# Patient Record
Sex: Female | Born: 1970
Health system: Southern US, Community
[De-identification: ages and names within clinical notes are randomized; demographics above are authoritative.]

## PROBLEM LIST (undated history)

## (undated) DIAGNOSIS — E7800 Pure hypercholesterolemia, unspecified: Secondary | ICD-10-CM

## (undated) DIAGNOSIS — L709 Acne, unspecified: Secondary | ICD-10-CM

## (undated) DIAGNOSIS — B009 Herpesviral infection, unspecified: Secondary | ICD-10-CM

## (undated) DIAGNOSIS — Z973 Presence of spectacles and contact lenses: Secondary | ICD-10-CM

## (undated) DIAGNOSIS — Z8669 Personal history of other diseases of the nervous system and sense organs: Secondary | ICD-10-CM

## (undated) DIAGNOSIS — Z8489 Family history of other specified conditions: Secondary | ICD-10-CM

## (undated) DIAGNOSIS — R519 Headache, unspecified: Secondary | ICD-10-CM

## (undated) DIAGNOSIS — R51 Headache: Secondary | ICD-10-CM

## (undated) DIAGNOSIS — K5909 Other constipation: Secondary | ICD-10-CM

## (undated) HISTORY — PX: COLONOSCOPY: SHX174

## (undated) HISTORY — PX: ABDOMINAL HYSTERECTOMY: SHX81

## (undated) HISTORY — PX: TONSILLECTOMY: SUR1361

## (undated) HISTORY — DX: Other constipation: K59.09

---

## 2005-08-07 ENCOUNTER — Ambulatory Visit: Payer: Self-pay | Admitting: Internal Medicine

## 2005-08-14 ENCOUNTER — Ambulatory Visit: Payer: Self-pay | Admitting: Internal Medicine

## 2010-11-30 ENCOUNTER — Ambulatory Visit: Payer: Self-pay | Admitting: Unknown Physician Specialty

## 2012-01-01 ENCOUNTER — Ambulatory Visit: Payer: Self-pay | Admitting: Obstetrics and Gynecology

## 2012-04-03 ENCOUNTER — Ambulatory Visit (INDEPENDENT_AMBULATORY_CARE_PROVIDER_SITE_OTHER): Payer: BC Managed Care – PPO | Admitting: Family Medicine

## 2012-04-03 ENCOUNTER — Ambulatory Visit: Payer: BC Managed Care – PPO

## 2012-04-03 VITALS — BP 120/78 | HR 80 | Temp 98.1°F | Resp 16 | Ht 66.0 in | Wt 145.0 lb

## 2012-04-03 DIAGNOSIS — S93401A Sprain of unspecified ligament of right ankle, initial encounter: Secondary | ICD-10-CM

## 2012-04-03 DIAGNOSIS — S93409A Sprain of unspecified ligament of unspecified ankle, initial encounter: Secondary | ICD-10-CM

## 2012-04-03 DIAGNOSIS — M25579 Pain in unspecified ankle and joints of unspecified foot: Secondary | ICD-10-CM

## 2012-04-03 IMAGING — CR DG ANKLE COMPLETE 3+V*R*
2 series · 2 of 2 positions shown · non-contrast
Comparison: None

CLINICAL DATA: Ankle pain

RIGHT ANKLE - COMPLETE 3+ VIEW

[AP]
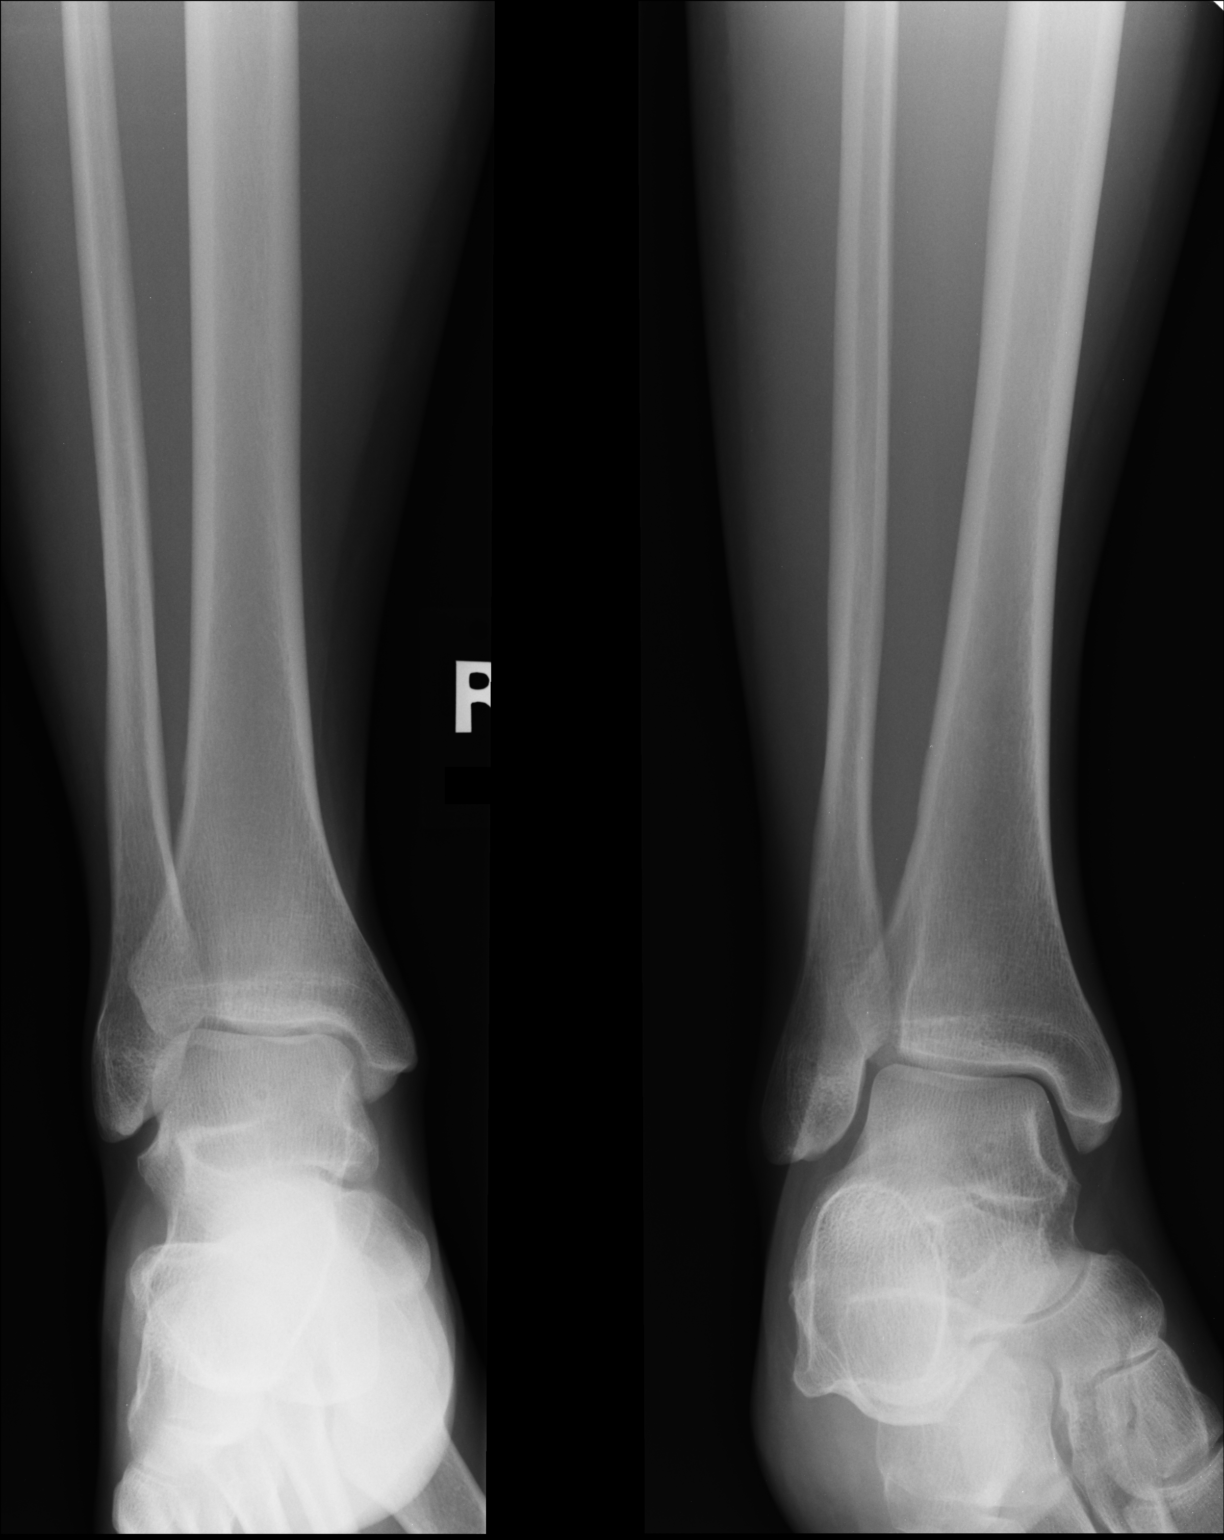

[lateral]
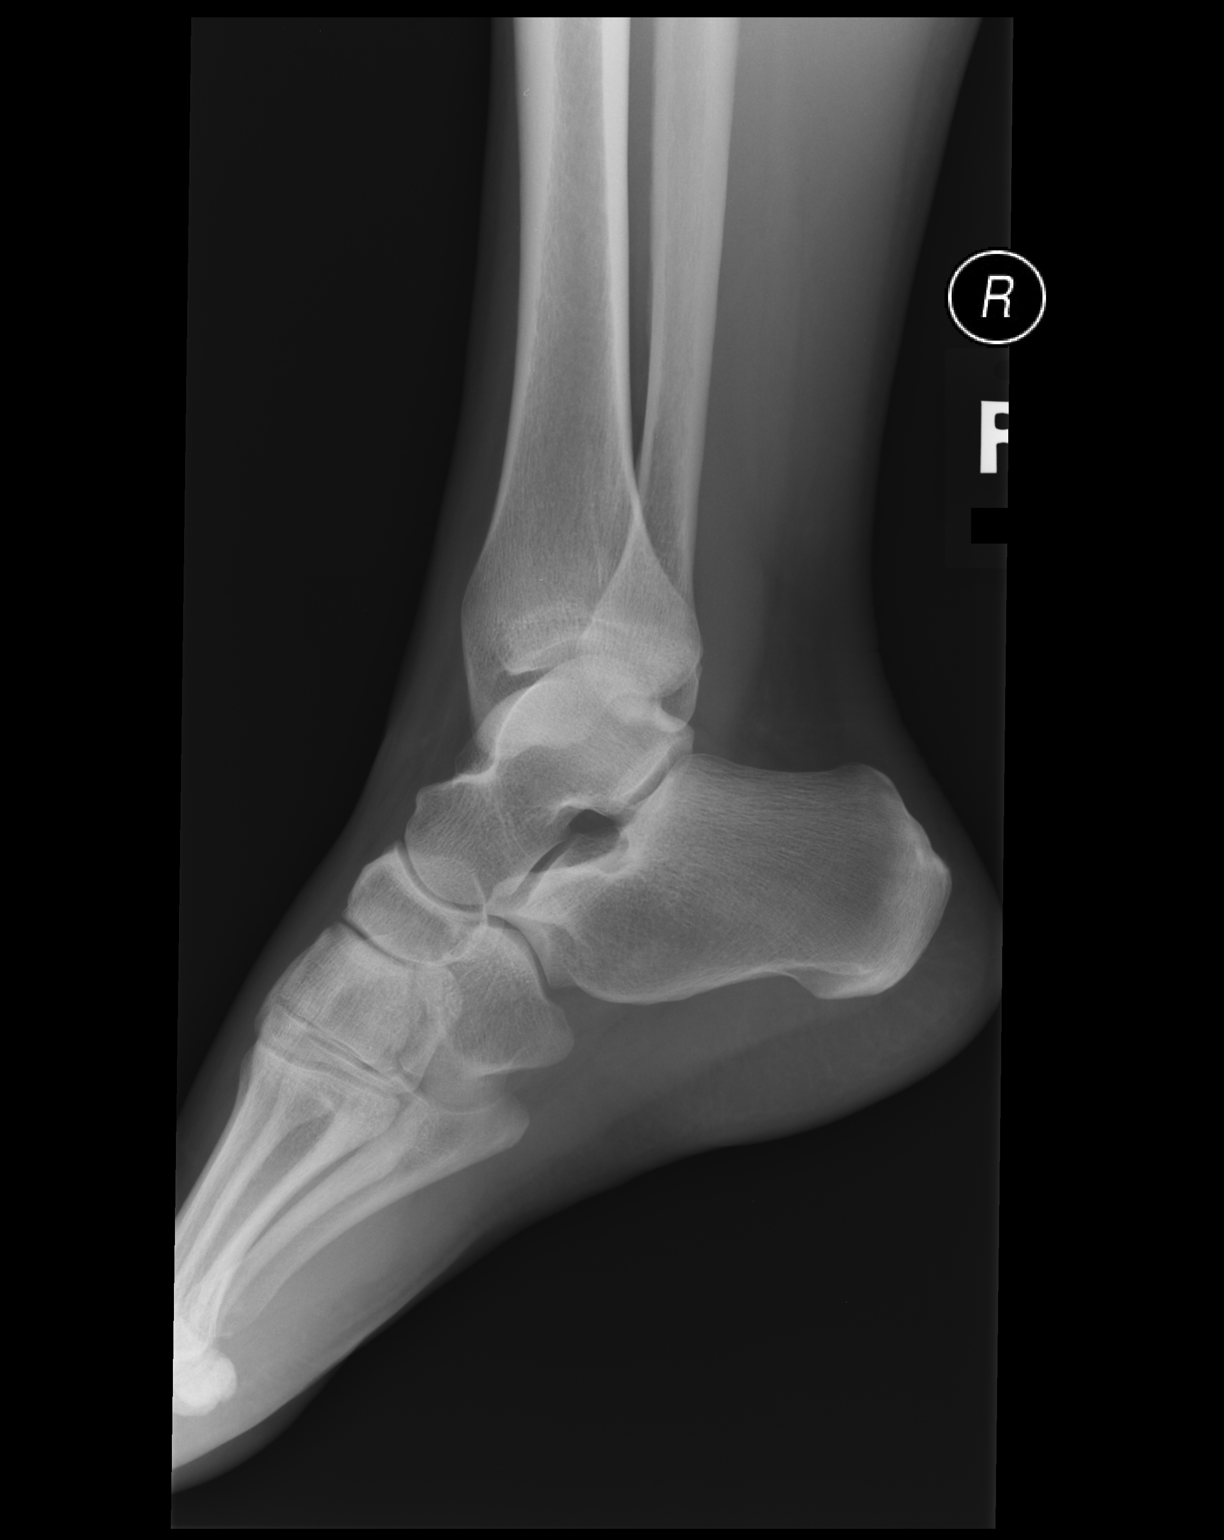

[2 of 2 positions shown; findings below may reference images not displayed]

FINDINGS: There is no evidence of fracture or dislocation.  There
is no evidence of arthropathy or other focal bone abnormality.
Soft tissues are unremarkable.
IMPRESSION: Negative exam.

## 2012-04-03 MED ORDER — DICLOFENAC SODIUM 75 MG PO TBEC: 75.0000 mg | DELAYED_RELEASE_TABLET | Freq: Two times a day (BID) | ORAL | Status: AC

## 2012-04-03 NOTE — Progress Notes (Signed)
Subjective: Patient stepped wrong off a step on Friday and turned her right ankle. She did not actually fall with it. Could not feel any pop. It bothered her a little bit of the weekend. Last night she does not know of doing anything more to it, but this morning woke up and the foot was more swollen. She is to walk with a lamp and stiff foot. She is able to bear weight on it.  Objective: Obvious swelling of the dorsum of the right foot laterally in the proximal one half of the foot. No obvious bruising. She is able to walk with a lap. There is tenderness in the anterior proximal ligaments of the foot, and to lesser degree around the lateral malleolus. Motor and vascular status are intact.  Assessment; Ankle pain and probable sprain  Plan: X-ray right ankle [facility] reading (PRIMARY) by  Dr. Alwyn Ren Normal ankle  Sprain;  Will use swedo.Marland Kitchen

## 2012-04-03 NOTE — Patient Instructions (Signed)
Ankle Sprain An ankle sprain is an injury to the strong, fibrous tissues (ligaments) that hold the bones of your ankle joint together.  CAUSES Ankle sprain usually is caused by a fall or by twisting your ankle. People who participate in sports are more prone to these types of injuries.  SYMPTOMS  Symptoms of ankle sprain include:  Pain in your ankle. The pain may be present at rest or only when you are trying to stand or walk.   Swelling.   Bruising. Bruising may develop immediately or within 1 to 2 days after your injury.   Difficulty standing or walking.  DIAGNOSIS  Your caregiver will ask you details about your injury and perform a physical exam of your ankle to determine if you have an ankle sprain. During the physical exam, your caregiver will press and squeeze specific areas of your foot and ankle. Your caregiver will try to move your ankle in certain ways. An X-ray exam may be done to be sure a bone was not broken or a ligament did not separate from one of the bones in your ankle (avulsion).  TREATMENT  Certain types of braces can help stabilize your ankle. Your caregiver can make a recommendation for this. Your caregiver may recommend the use of medication for pain. If your sprain is severe, your caregiver may refer you to a surgeon who helps to restore function to parts of your skeletal system (orthopedist) or a physical therapist. HOME CARE INSTRUCTIONS  Apply ice to your injury for 1 to 2 days or as directed by your caregiver. Applying ice helps to reduce inflammation and pain.  Put ice in a plastic bag.   Place a towel between your skin and the bag.   Leave the ice on for 15 to 20 minutes at a time, every 2 hours while you are awake.   Take over-the-counter or prescription medicines for pain, discomfort, or fever only as directed by your caregiver.   Keep your injured leg elevated, when possible, to lessen swelling.   If your caregiver recommends crutches, use them as  instructed. Gradually, put weight on the affected ankle. Continue to use crutches or a cane until you can walk without feeling pain in your ankle.   If you have a plaster splint, wear the splint as directed by your caregiver. Do not rest it on anything harder than a pillow the first 24 hours. Do not put weight on it. Do not get it wet. You may take it off to take a shower or bath.   You may have been given an elastic bandage to wear around your ankle to provide support. If the elastic bandage is too tight (you have numbness or tingling in your foot or your foot becomes cold and blue), adjust the bandage to make it comfortable.   If you have an air splint, you may blow more air into it or let air out to make it more comfortable. You may take your splint off at night and before taking a shower or bath.   Wiggle your toes in the splint several times per day if you are able.  SEEK MEDICAL CARE IF:   You have an increase in bruising, swelling, or pain.   Your toes feel cold.   Pain relief is not achieved with medication.  SEEK IMMEDIATE MEDICAL CARE IF: Your toes are numb or blue or you have severe pain. MAKE SURE YOU:   Understand these instructions.   Will watch your condition.     Will get help right away if you are not doing well or get worse.  Document Released: 12/04/2005 Document Revised: 11/23/2011 Document Reviewed: 07/08/2008 [employer] Patient Information 2012 [employer]. 

## 2014-09-23 ENCOUNTER — Ambulatory Visit: Payer: Self-pay | Admitting: Obstetrics and Gynecology

## 2014-09-23 LAB — CBC
HCT: 43.8 % (ref 35.0–47.0)
HGB: 14.3 g/dL (ref 12.0–16.0)
MCH: 32.5 pg (ref 26.0–34.0)
MCHC: 32.5 g/dL (ref 32.0–36.0)
MCV: 100 fL (ref 80–100)
PLATELETS: 186 10*3/uL (ref 150–440)
RBC: 4.38 10*6/uL (ref 3.80–5.20)
RDW: 12.6 % (ref 11.5–14.5)
WBC: 7.9 10*3/uL (ref 3.6–11.0)

## 2014-09-23 LAB — BASIC METABOLIC PANEL
ANION GAP: 5 — AB (ref 7–16)
BUN: 12 mg/dL (ref 7–18)
CALCIUM: 8.8 mg/dL (ref 8.5–10.1)
CHLORIDE: 108 mmol/L — AB (ref 98–107)
CO2: 25 mmol/L (ref 21–32)
CREATININE: 0.62 mg/dL (ref 0.60–1.30)
EGFR (African American): >60
Glucose: 86 mg/dL (ref 65–99)
OSMOLALITY: 275 (ref 275–301)
Potassium: 3.9 mmol/L (ref 3.5–5.1)
SODIUM: 138 mmol/L (ref 136–145)

## 2014-10-01 ENCOUNTER — Ambulatory Visit: Payer: Self-pay | Admitting: Obstetrics and Gynecology

## 2014-10-02 LAB — BASIC METABOLIC PANEL
ANION GAP: 7 (ref 7–16)
BUN: 10 mg/dL (ref 7–18)
CHLORIDE: 110 mmol/L — AB (ref 98–107)
CO2: 22 mmol/L (ref 21–32)
Calcium, Total: 7.6 mg/dL — ABNORMAL LOW (ref 8.5–10.1)
Creatinine: 0.7 mg/dL (ref 0.60–1.30)
GLUCOSE: 133 mg/dL — AB (ref 65–99)
Osmolality: 279 (ref 275–301)
Potassium: 3.8 mmol/L (ref 3.5–5.1)
SODIUM: 139 mmol/L (ref 136–145)

## 2014-10-02 LAB — CBC WITH DIFFERENTIAL/PLATELET
Basophil #: 0 10*3/uL (ref 0.0–0.1)
Basophil %: 0.2 %
Eosinophil #: 0 10*3/uL (ref 0.0–0.7)
Eosinophil %: 0.2 %
HCT: 35 % (ref 35.0–47.0)
HGB: 11.6 g/dL — ABNORMAL LOW (ref 12.0–16.0)
Lymphocyte #: 1.3 10*3/uL (ref 1.0–3.6)
Lymphocyte %: 8 %
MCH: 33.4 pg (ref 26.0–34.0)
MCHC: 33.3 g/dL (ref 32.0–36.0)
MCV: 101 fL — ABNORMAL HIGH (ref 80–100)
MONOS PCT: 6 %
Monocyte #: 1 x10 3/mm — ABNORMAL HIGH (ref 0.2–0.9)
NEUTROS ABS: 14.1 10*3/uL — AB (ref 1.4–6.5)
NEUTROS PCT: 85.6 %
Platelet: 133 10*3/uL — ABNORMAL LOW (ref 150–440)
RBC: 3.48 10*6/uL — ABNORMAL LOW (ref 3.80–5.20)
RDW: 12.6 % (ref 11.5–14.5)
WBC: 16.5 10*3/uL — ABNORMAL HIGH (ref 3.6–11.0)

## 2014-10-03 LAB — PATHOLOGY REPORT

## 2014-12-18 HISTORY — PX: ABDOMINAL HYSTERECTOMY: SHX81

## 2015-04-10 NOTE — Op Note (Signed)
PATIENT NAME:  Beth [name], BODNER MR#:  [id number] DATE OF BIRTH:  Jun 24, 1971  DATE OF PROCEDURE:  10/01/2014  PREOPERATIVE DIAGNOSES: Menorrhagia and severe dysmenorrhea.   POSTOPERATIVE DIAGNOSIS: Menorrhagia and severe dysmenorrhea.   OPERATIONS PERFORMED: Total laparoscopic hysterectomy, bilateral salpingectomy and cystoscopy.   ANESTHESIA USED: General.   PRIMARY SURGEON: Dorthula Nettles, MD   ASSISTANT: Erik Obey, MD  PREOPERATIVE ANTIBIOTICS:  2 grams of Ancef.   ESTIMATED BLOOD LOSS: 350 mL.    URINE OUTPUT: 350 mL.  DRAINS OR TUBES: Foley to gravity drainage.   COMPLICATIONS: None.   INTRAOPERATIVE FINDINGS: Normal tubes, ovaries, and uterus. Hulka clip adherent to the right tube, absent on the left. Cystoscopy at the conclusion of the case revealed bilateral efflux of urine from both ureteral orifices and intact bladder dome.   SPECIMENS REMOVED: Uterus, cervix and bilateral tubes.   PATIENT CONDITION FOLLOWING PROCEDURE: Stable.   PROCEDURE IN DETAIL: Risks, benefits and alternatives of the procedure were discussed with the patient prior to proceeding to the operating room. The patient was taken to the operating room where she was placed under general endotracheal anesthesia. She was positioned in the dorsal lithotomy position using Allen stirrups, prepped and draped in the usual sterile fashion. A timeout was performed. Attention was turned to the patient's pelvis. The bladder was drained using an indwelling Foley catheter. An operative speculum was then placed. The cervix was visualized. The anterior lip of the cervix was grasped with a single-tooth tenaculum and the uterus sounded to 8 cm. A large VCare device was then place for manipulation of the uterus and the single-tooth tenaculum and operative speculum were removed. The VCare cup was snugged against the cervix and attention was turned the patient's abdomen. Stab incision was made at the umbilicus after  infiltrating 0.5% Sensorcaine. A 5 mm XL trocar was used to gain direct entry into the peritoneal cavity under visualization. Pneumoperitoneum was established, 2 lateral 5 mm assistant ports, 1 left and 1 right were then placed under direct visualization. Inspection of the abdomen and pelvis revealed normal findings. Attention was turned to the patient's right tube. This was grasped at its fimbriated end and it was transected off attachments to the ovary and mesosalpinx using a 5 mm Harmonic scalpel. The utero-ovarian ligament was then transected using the Harmonic scalpel followed by the round ligament. The anterior leaf of the broad was taken down to the level of the internal cervical os and a bladder flap was begun. The posterior leaf of the broad ligament was then taken down to the level of the  right uterosacral. The uterine artery was skeletonized, cauterized with a bipolar cautery and then transected using the 5 mm LigaSure. Attention was then turned to the patient's left adnexal structure. The tube was removed from its attachment in a similar fashion. The left utero-ovarian and round ligament were likewise transected as had been done on the right and the uterine artery was skeletonized as had been done on the right. At this point the bladder flap was reinspected and noted to be well off the [facility] device. Anterior colpotomy was made with the 5 mm Harmonic scalpel, carried down circumferentially to allow excision of the uterus. There was some bleeding encountered in both the uterine artery pedicles which was able to be brought under hemostasis using bipolar energy. The specimen was then removed vaginally. The pedicles were reinspected and noted to be hemostatic. The vaginal cuff was closed using interrupted figure-of-eight sutures of 0 Vicryl. Utilizing  a vaginal approach, cystoscopy was then performed noting an intact bladder dome and bilateral efflux of urine from both ureters. At this point, the  laparoscopes were reinserted and the pelvis was copiously irrigated, pedicles were once again inspected and noted to be hemostatic. The ureters were visualized and portioned well away from the field of dissection. One gram of Arista powder was applied to all pedicles. Pneumoperitoneum was evacuated. The trocars were removed. Trocar sites dressed with Dermabond. Sponge, needle, and instrument counts were correct x 2. The patient tolerated the procedure well and was taken to the recovery room in stable condition.    ____________________________ Stoney Bang. Georgianne Fick, MD ams:JT D: 10/02/2014 22:12:14 ET T: 10/02/2014 23:40:19 ET JOB#: [id number]  cc: Stoney Bang. Georgianne Fick, MD, <Dictator> Conan Bowens Madelon Lips MD ELECTRONICALLY SIGNED 10/15/2014 13:32

## 2016-03-30 DIAGNOSIS — K589 Irritable bowel syndrome without diarrhea: Secondary | ICD-10-CM | POA: Diagnosis not present

## 2016-03-30 DIAGNOSIS — K6289 Other specified diseases of anus and rectum: Secondary | ICD-10-CM | POA: Diagnosis not present

## 2016-06-26 DIAGNOSIS — K602 Anal fissure, unspecified: Secondary | ICD-10-CM | POA: Diagnosis not present

## 2016-08-08 ENCOUNTER — Ambulatory Visit: Payer: Self-pay | Admitting: Surgery

## 2016-08-08 ENCOUNTER — Encounter: Payer: Self-pay | Admitting: Surgery

## 2016-08-08 DIAGNOSIS — K602 Anal fissure, unspecified: Secondary | ICD-10-CM | POA: Diagnosis not present

## 2016-08-08 DIAGNOSIS — K601 Chronic anal fissure: Secondary | ICD-10-CM

## 2016-08-08 DIAGNOSIS — Z720 Tobacco use: Secondary | ICD-10-CM | POA: Diagnosis not present

## 2016-08-08 DIAGNOSIS — K5909 Other constipation: Secondary | ICD-10-CM | POA: Diagnosis not present

## 2016-08-08 DIAGNOSIS — Z01818 Encounter for other preprocedural examination: Secondary | ICD-10-CM | POA: Diagnosis not present

## 2016-08-08 HISTORY — DX: Other constipation: K59.09

## 2016-08-08 NOTE — H&P (Signed)
Beth [name] 08/08/2016 8:27 AM Location: [facility] Patient #: [id number] DOB: 10-May-1971 Married / Language: English / Race: White Female  Patient Care Team: Caryl Bis, MD as PCP - General (Unknown Physician Specialty) Michael Boston, MD as Consulting Physician (General Surgery) Wonda Horner, MD as Consulting Physician (Gastroenterology)   History of Present Illness [name] MD; 08/08/2016 9:07 AM) The patient is a 45 year old female who presents with anal pain. Note for "Anal pain": Patient sent for surgical consultation by Dr. Penelope Coop with [facility] gastroenterology. 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  Pleasant smoking female. Usually moves her bowels about twice a week. She's had intermittent episodes of severe anal pain for the trial 12 months. Usually coughs down but in the past few months it's been constant. Exquisitely painful with bowel movements. Concerned. Was given steroid suppositories by primary care office to see if that would help. It did not. Eventually sent to see gastroenterology. Suspicion for an anal fissure. Started on diltiazem cream. She's been placing it perianally 4 times a day for at least 3 weeks. It has not resolved. Based on this, surgical consultation recommended.   She had a colonoscopy in 2015 that was otherwise okay. She's had no anorectal issues. Does not recall any hemorrhoidal flares. She can walk a half hour without difficulty. No personal nor family history of GI/colon cancer, inflammatory bowel disease, irritable bowel syndrome, allergy such as Celiac Sprue, dietary/dairy problems, colitis, ulcers nor gastritis. No recent sick contacts/gastroenteritis. No travel outside the country. No changes in diet. No dysphagia to solids or liquids. No significant heartburn or reflux. No hematochezia, hematemesis, coffee ground emesis. No evidence of prior gastric/peptic ulceration.   Other Problems Marjean Donna, CMA;  08/08/2016 8:28 AM) No pertinent past medical history  Past Surgical History Marjean Donna, [place]; 08/08/2016 8:28 AM) Hysterectomy (not due to cancer) - Partial  Diagnostic Studies History Marjean Donna, CMA; 08/08/2016 8:28 AM) Colonoscopy 1-5 years ago Mammogram 1-3 years ago Pap Smear 1-5 years ago  Allergies Marjean Donna, CMA; 08/08/2016 8:29 AM) Sulfa Antibiotics  Medication History (Sonya Bynum, CMA; 08/08/2016 8:29 AM) ValACYclovir HCl ('500MG'$  Tablet, Oral as needed) Active. Multivitamin Adult (Oral) Active. Medications Reconciled  Social History Marjean Donna, CMA; 08/08/2016 8:28 AM) Alcohol use Occasional alcohol use. Caffeine use Coffee. No drug use Tobacco use Current every day smoker.  Family History Marjean Donna, CMA; 08/08/2016 8:28 AM) Diabetes Mellitus Daughter, Father.  Pregnancy / Birth History Marjean Donna, [place]; 08/08/2016 8:28 AM) Age at menarche 58 years. Gravida 3 Maternal age 23-25 Para 3     Review of Systems ([place]; 08/08/2016 8:28 AM) General Not Present- Appetite Loss, Chills, Fatigue, Fever, Night Sweats, Weight Gain and Weight Loss. Skin Present- Dryness. Not Present- Change in Wart/Mole, Hives, Jaundice, New Lesions, Non-Healing Wounds, Rash and Ulcer. HEENT Present- Seasonal Allergies and Wears glasses/contact lenses. Not Present- Earache, Hearing Loss, Hoarseness, Nose Bleed, Oral Ulcers, Ringing in the Ears, Sinus Pain, Sore Throat, Visual Disturbances and Yellow Eyes. Respiratory Not Present- Bloody sputum, Chronic Cough, Difficulty Breathing, Snoring and Wheezing. Breast Not Present- Breast Mass, Breast Pain, Nipple Discharge and Skin Changes. Cardiovascular Not Present- Chest Pain, Difficulty Breathing Lying Down, Leg Cramps, Palpitations, Rapid Heart Rate, Shortness of Breath and Swelling of Extremities. Gastrointestinal Present- Abdominal Pain, Bloating, Constipation, Nausea and Rectal Pain. Not Present- Bloody Stool,  Change in Bowel Habits, Chronic diarrhea, Difficulty Swallowing, Excessive gas, Gets full quickly at meals, Hemorrhoids, Indigestion and Vomiting. Female Genitourinary Not  Present- Frequency, Nocturia, Painful Urination, Pelvic Pain and Urgency. Musculoskeletal Not Present- Back Pain, Joint Pain, Joint Stiffness, Muscle Pain, Muscle Weakness and Swelling of Extremities. Neurological Not Present- Decreased Memory, Fainting, Headaches, Numbness, Seizures, Tingling, Tremor, Trouble walking and Weakness. Psychiatric Not Present- Anxiety, Bipolar, Change in Sleep Pattern, Depression, Fearful and Frequent crying. Endocrine Not Present- Cold Intolerance, Excessive Hunger, Hair Changes, Heat Intolerance, Hot flashes and New Diabetes. Hematology Not Present- Blood Thinners, Easy Bruising, Excessive bleeding, Gland problems, HIV and Persistent Infections.  Vitals (Sonya Bynum CMA; 08/08/2016 8:28 AM) 08/08/2016 8:28 AM Weight: 150 lb Height: 65in Body Surface Area: 1.75 m Body Mass Index: 24.96 kg/m  Temp.: 97.66F(Temporal)  Pulse: 79 (Regular)  BP: 124/76 (Sitting, Left Arm, Standard)      Physical Exam [name] MD; 08/08/2016 8:59 AM)  General Mental Status-Alert. General Appearance-Not in acute distress, Not Sickly. Orientation-Oriented X3. Hydration-Well hydrated. Voice-Normal. Note: Not toxic.  Integumentary Global Assessment Upon inspection and palpation of skin surfaces of the - Axillae: non-tender, no inflammation or ulceration, no drainage. and Distribution of scalp and body hair is normal. General Characteristics Temperature - normal warmth is noted.  Head and Neck Head-normocephalic, atraumatic with no lesions or palpable masses. Face Global Assessment - atraumatic, no absence of expression. Neck Global Assessment - no abnormal movements, no bruit auscultated on the right, no bruit auscultated on the left, no decreased range of motion,  non-tender. Trachea-midline. Thyroid Gland Characteristics - non-tender.  Eye Eyeball - Left-Extraocular movements intact, No Nystagmus. Eyeball - Right-Extraocular movements intact, No Nystagmus. Cornea - Left-No Hazy. Cornea - Right-No Hazy. Sclera/Conjunctiva - Left-No scleral icterus, No Discharge. Sclera/Conjunctiva - Right-No scleral icterus, No Discharge. Pupil - Left-Direct reaction to light normal. Pupil - Right-Direct reaction to light normal.  ENMT Ears Pinna - Left - no drainage observed, no generalized tenderness observed. Right - no drainage observed, no generalized tenderness observed. Nose and Sinuses External Inspection of the Nose - no destructive lesion observed. Inspection of the nares - Left - quiet respiration. Right - quiet respiration. Mouth and Throat Lips - Upper Lip - no fissures observed, no pallor noted. Lower Lip - no fissures observed, no pallor noted. Nasopharynx - no discharge present. Oral Cavity/Oropharynx - Tongue - no dryness observed. Oral Mucosa - no cyanosis observed. Hypopharynx - no evidence of airway distress observed.  Chest and Lung Exam Inspection Movements - Normal and Symmetrical. Accessory muscles - No use of accessory muscles in breathing. Palpation Palpation of the chest reveals - Non-tender. Auscultation Breath sounds - Normal and Clear.  Cardiovascular Auscultation Rhythm - Regular. Murmurs & Other Heart Sounds - Auscultation of the heart reveals - No Murmurs and No Systolic Clicks.  Abdomen Inspection Inspection of the abdomen reveals - No Visible peristalsis and No Abnormal pulsations. Umbilicus - No Bleeding, No Urine drainage. Palpation/Percussion Palpation and Percussion of the abdomen reveal - Soft, Non Tender, No Rebound tenderness, No Rigidity (guarding) and No Cutaneous hyperesthesia.  Female Genitourinary Sexual Maturity Tanner 5 - Adult hair pattern. Note: No vaginal bleeding nor  discharge  Rectal Note: Posterior midline anal tag with increased sphincter stone. Very sensitive posterior midline. Strongly suspicious for anal fissure.   Exam done with assistance of female Medical Assistant in the room. Perianal skin clean with good hygiene. No pruritis ani. No pilonidal disease. No abscess/fistula. No external hemorrhoids. Held off on any digital or anoscopic exam.  Peripheral Vascular Upper Extremity Inspection - Left - No Cyanotic nailbeds, Not Ischemic. Right - No  Cyanotic nailbeds, Not Ischemic.  Neurologic Neurologic evaluation reveals -normal attention span and ability to concentrate, able to name objects and repeat phrases. Appropriate fund of knowledge , normal sensation and normal coordination. Mental Status Affect - not angry, not paranoid. Cranial Nerves-Normal Bilaterally. Gait-Normal.  Neuropsychiatric Mental status exam performed with findings of-able to articulate well with normal speech/language, rate, volume and coherence, thought content normal with ability to perform basic computations and apply abstract reasoning and no evidence of hallucinations, delusions, obsessions or homicidal/suicidal ideation. Note: Quiet. A little sad.  Musculoskeletal Global Assessment Spine, Ribs and Pelvis - no instability, subluxation or laxity. Right Upper Extremity - no instability, subluxation or laxity.  Lymphatic Head & Neck  General Head & Neck Lymphatics: Bilateral - Description - No Localized lymphadenopathy. Axillary  General Axillary Region: Bilateral - Description - No Localized lymphadenopathy. Femoral & Inguinal  Generalized Femoral & Inguinal Lymphatics: Left - Description - No Localized lymphadenopathy. Right - Description - No Localized lymphadenopathy.    Assessment & Plan [name] MD; 08/08/2016 9:01 AM)  ANAL FISSURE (K60.2) Impression: Rather classic history for anal fissure with intermittent pain over the past  year.  She is tried muscle relaxants antispasmodic therapy for times a day. Diltiazem is not healed fistula.  Think she requires a more aggressive intervention. We do examination under anesthesia with partial internal sphincterotomy. She is interested in proceeding.  Current Plans Pt Education - [facility] Anal Fissure (Vashaun Osmon) ENCOUNTER FOR PREOPERATIVE EXAMINATION FOR GENERAL SURGICAL PROCEDURE (Z01.818)  Current Plans You are being scheduled for surgery - Our schedulers will call you.  You should hear from our office's scheduling department within 5 working days about the location, date, and time of surgery. We try to make accommodations for patient's preferences in scheduling surgery, but sometimes the OR schedule or the surgeon's schedule prevents [place] from making those accommodations.  If you have not heard from our office [phone]) in 5 working days, call the office and ask for your surgeon's nurse.  If you have other questions about your diagnosis, plan, or surgery, call the office and ask for your surgeon's nurse.  Pt Education - [facility] Rectal Prep for Anorectal outpatient/office surgery: discussed with patient and provided information. Pt Education - [facility] Rectal Surgery HCI (Kema Santaella): discussed with patient and provided information. Pt Education - [facility] Hemorrhoids (Abbe Bula): discussed with patient and provided information. TOBACCO ABUSE (Z72.0) Impression: I noted that smokers have increased pain and poor wound healing.  I strongly recommend she quit smoking to break the cycle of wound problems and other health issues.  Current Plans Pt Education - [facility] STOP SMOKING! CHRONIC CONSTIPATION (K59.09) Impression: Note this is the major risk factor for having further anorectal problems.  I strongly recommend she increase her MiraLAX until she's having 1 soft bowel movement a day.  If she is on high dose of MiraLAX without improvement, consider seeing gastroenterology to see if antispasmodics  or promotility agents or different bowel regimen would be of use.  Current Plans Pt Education - [facility] Constipation (AT) Pt Education - [facility] Good Bowel Health (Jazen Spraggins)  [name], M.D., F.A.C.S. Gastrointestinal and Minimally Invasive Surgery [facility] [address], [place], [place] [address] [phone] Main / Paging

## 2016-08-28 ENCOUNTER — Encounter (HOSPITAL_COMMUNITY): Payer: Self-pay | Admitting: *Deleted

## 2016-08-30 ENCOUNTER — Ambulatory Visit (HOSPITAL_COMMUNITY): Payer: BLUE CROSS/BLUE SHIELD | Admitting: Anesthesiology

## 2016-08-30 ENCOUNTER — Encounter (HOSPITAL_COMMUNITY): Payer: Self-pay

## 2016-08-30 ENCOUNTER — Ambulatory Visit (HOSPITAL_COMMUNITY)
Admission: RE | Admit: 2016-08-30 | Discharge: 2016-08-30 | Disposition: A | Discharge status: Home / Self Care | Payer: BLUE CROSS/BLUE SHIELD | Source: Ambulatory Visit | Attending: Surgery | Admitting: Surgery

## 2016-08-30 ENCOUNTER — Encounter (HOSPITAL_COMMUNITY)
Admission: RE | Disposition: A | Discharge status: Home / Self Care | Payer: Self-pay | Source: Ambulatory Visit | Attending: Surgery

## 2016-08-30 DIAGNOSIS — K601 Chronic anal fissure: Secondary | ICD-10-CM | POA: Diagnosis present

## 2016-08-30 DIAGNOSIS — F17200 Nicotine dependence, unspecified, uncomplicated: Secondary | ICD-10-CM | POA: Insufficient documentation

## 2016-08-30 DIAGNOSIS — Z720 Tobacco use: Secondary | ICD-10-CM

## 2016-08-30 DIAGNOSIS — K602 Anal fissure, unspecified: Secondary | ICD-10-CM | POA: Diagnosis present

## 2016-08-30 DIAGNOSIS — D013 Carcinoma in situ of anus and anal canal: Secondary | ICD-10-CM | POA: Diagnosis not present

## 2016-08-30 DIAGNOSIS — K5909 Other constipation: Secondary | ICD-10-CM | POA: Diagnosis present

## 2016-08-30 DIAGNOSIS — K644 Residual hemorrhoidal skin tags: Secondary | ICD-10-CM | POA: Diagnosis not present

## 2016-08-30 HISTORY — DX: Headache, unspecified: R51.9

## 2016-08-30 HISTORY — PX: SPHINCTEROTOMY: SHX5279

## 2016-08-30 HISTORY — DX: Headache: R51

## 2016-08-30 LAB — CBC
HEMATOCRIT: 44.1 % (ref 36.0–46.0)
HEMOGLOBIN: 14.7 g/dL (ref 12.0–15.0)
MCH: 33.3 pg (ref 26.0–34.0)
MCHC: 33.3 g/dL (ref 30.0–36.0)
MCV: 99.8 fL (ref 78.0–100.0)
Platelets: 186 10*3/uL (ref 150–400)
RBC: 4.42 MIL/uL (ref 3.87–5.11)
RDW: 12.8 % (ref 11.5–15.5)
WBC: 8.9 10*3/uL (ref 4.0–10.5)

## 2016-08-30 SURGERY — SPHINCTEROTOMY, ANAL
Anesthesia: General

## 2016-08-30 MED ORDER — BUPIVACAINE-EPINEPHRINE 0.5% -1:200000 IJ SOLN
INTRAMUSCULAR | Status: AC
  Filled 2016-08-30: qty 1

## 2016-08-30 MED ORDER — BUPIVACAINE LIPOSOME 1.3 % IJ SUSP
20.0000 mL | INTRAMUSCULAR | Status: DC
  Filled 2016-08-30: qty 20

## 2016-08-30 MED ORDER — ONDANSETRON HCL 4 MG/2ML IJ SOLN
INTRAMUSCULAR | Status: DC | PRN
  Administered 2016-08-30: 4 mg via INTRAVENOUS

## 2016-08-30 MED ORDER — OXYCODONE HCL 5 MG PO TABS
5.0000 mg | ORAL_TABLET | ORAL | 0 refills | Status: DC | PRN
Start: 2016-08-30 — End: 2017-11-12

## 2016-08-30 MED ORDER — ONDANSETRON HCL 4 MG/2ML IJ SOLN
INTRAMUSCULAR | Status: AC
  Filled 2016-08-30: qty 2

## 2016-08-30 MED ORDER — SODIUM CHLORIDE 0.9 % IJ SOLN
INTRAMUSCULAR | Status: AC
  Filled 2016-08-30: qty 50

## 2016-08-30 MED ORDER — CEFAZOLIN SODIUM-DEXTROSE 2-4 GM/100ML-% IV SOLN
INTRAVENOUS | Status: AC
  Filled 2016-08-30: qty 100

## 2016-08-30 MED ORDER — BUPIVACAINE LIPOSOME 1.3 % IJ SUSP
INTRAMUSCULAR | Status: DC | PRN
  Administered 2016-08-30: 20 mL

## 2016-08-30 MED ORDER — SUCCINYLCHOLINE CHLORIDE 20 MG/ML IJ SOLN
INTRAMUSCULAR | Status: DC | PRN
  Administered 2016-08-30: 100 mg via INTRAVENOUS

## 2016-08-30 MED ORDER — NAPROXEN 500 MG PO TABS: 500.0000 mg | ORAL_TABLET | Freq: Two times a day (BID) | ORAL | 2 refills | Status: DC

## 2016-08-30 MED ORDER — MEPERIDINE HCL 50 MG/ML IJ SOLN: 6.2500 mg | INTRAMUSCULAR | Status: DC | PRN

## 2016-08-30 MED ORDER — FENTANYL CITRATE (PF) 100 MCG/2ML IJ SOLN
INTRAMUSCULAR | Status: AC
  Filled 2016-08-30: qty 2

## 2016-08-30 MED ORDER — PROPOFOL 10 MG/ML IV BOLUS
INTRAVENOUS | Status: DC | PRN
  Administered 2016-08-30: 150 mg via INTRAVENOUS
  Administered 2016-08-30: 50 mg via INTRAVENOUS

## 2016-08-30 MED ORDER — DIBUCAINE 1 % RE OINT
TOPICAL_OINTMENT | RECTAL | Status: AC
  Filled 2016-08-30: qty 28

## 2016-08-30 MED ORDER — ACETAMINOPHEN 500 MG PO TABS
1000.0000 mg | ORAL_TABLET | ORAL | Status: AC
  Administered 2016-08-30: 1000 mg via ORAL
  Filled 2016-08-30: qty 2

## 2016-08-30 MED ORDER — 0.9 % SODIUM CHLORIDE (POUR BTL) OPTIME
TOPICAL | Status: DC | PRN
  Administered 2016-08-30: 1000 mL

## 2016-08-30 MED ORDER — DEXAMETHASONE SODIUM PHOSPHATE 10 MG/ML IJ SOLN
INTRAMUSCULAR | Status: DC | PRN
  Administered 2016-08-30: 10 mg via INTRAVENOUS

## 2016-08-30 MED ORDER — GABAPENTIN 300 MG PO CAPS
300.0000 mg | ORAL_CAPSULE | ORAL | Status: AC
  Administered 2016-08-30: 300 mg via ORAL
  Filled 2016-08-30: qty 1

## 2016-08-30 MED ORDER — OXYCODONE HCL 5 MG PO TABS: 5.0000 mg | ORAL_TABLET | ORAL | Status: DC | PRN

## 2016-08-30 MED ORDER — ONDANSETRON HCL 4 MG/2ML IJ SOLN: 4.0000 mg | Freq: Once | INTRAMUSCULAR | Status: DC | PRN

## 2016-08-30 MED ORDER — DEXAMETHASONE SODIUM PHOSPHATE 10 MG/ML IJ SOLN
INTRAMUSCULAR | Status: AC
  Filled 2016-08-30: qty 1

## 2016-08-30 MED ORDER — METHYLENE BLUE 0.5 % INJ SOLN
INTRAVENOUS | Status: AC
  Filled 2016-08-30: qty 10

## 2016-08-30 MED ORDER — LACTATED RINGERS IV SOLN
INTRAVENOUS | Status: DC | PRN
  Administered 2016-08-30: 12:00:00 via INTRAVENOUS

## 2016-08-30 MED ORDER — LIDOCAINE HCL (CARDIAC) 20 MG/ML IV SOLN
INTRAVENOUS | Status: DC | PRN
  Administered 2016-08-30: 60 mg via INTRAVENOUS

## 2016-08-30 MED ORDER — DIBUCAINE 1 % EX OINT
TOPICAL_OINTMENT | CUTANEOUS | Status: DC | PRN
  Administered 2016-08-30: 1

## 2016-08-30 MED ORDER — MIDAZOLAM HCL 2 MG/2ML IJ SOLN
INTRAMUSCULAR | Status: AC
  Filled 2016-08-30: qty 2

## 2016-08-30 MED ORDER — CELECOXIB 200 MG PO CAPS
400.0000 mg | ORAL_CAPSULE | ORAL | Status: AC
  Administered 2016-08-30: 400 mg via ORAL
  Filled 2016-08-30: qty 2

## 2016-08-30 MED ORDER — METRONIDAZOLE IN NACL 5-0.79 MG/ML-% IV SOLN
INTRAVENOUS | Status: AC
  Filled 2016-08-30: qty 100

## 2016-08-30 MED ORDER — MIDAZOLAM HCL 5 MG/5ML IJ SOLN
INTRAMUSCULAR | Status: DC | PRN
  Administered 2016-08-30: 2 mg via INTRAVENOUS

## 2016-08-30 MED ORDER — LIDOCAINE 2% (20 MG/ML) 5 ML SYRINGE
INTRAMUSCULAR | Status: AC
  Filled 2016-08-30: qty 5

## 2016-08-30 MED ORDER — FENTANYL CITRATE (PF) 100 MCG/2ML IJ SOLN
INTRAMUSCULAR | Status: DC | PRN
  Administered 2016-08-30: 100 ug via INTRAVENOUS

## 2016-08-30 MED ORDER — PROPOFOL 10 MG/ML IV BOLUS
INTRAVENOUS | Status: AC
  Filled 2016-08-30: qty 20

## 2016-08-30 MED ORDER — OXYCODONE HCL 5 MG PO TABS: 10.0000 mg | ORAL_TABLET | ORAL | Status: DC | PRN

## 2016-08-30 MED ORDER — METRONIDAZOLE IN NACL 5-0.79 MG/ML-% IV SOLN
500.0000 mg | INTRAVENOUS | Status: AC
  Administered 2016-08-30: 500 mg via INTRAVENOUS
  Filled 2016-08-30: qty 100

## 2016-08-30 MED ORDER — CHLORHEXIDINE GLUCONATE CLOTH 2 % EX PADS: 6.0000 | MEDICATED_PAD | Freq: Once | CUTANEOUS | Status: DC

## 2016-08-30 MED ORDER — HYDROMORPHONE HCL 1 MG/ML IJ SOLN: 0.2500 mg | INTRAMUSCULAR | Status: DC | PRN

## 2016-08-30 MED ORDER — BUPIVACAINE-EPINEPHRINE 0.25% -1:200000 IJ SOLN
INTRAMUSCULAR | Status: DC | PRN
  Administered 2016-08-30: 20 mL

## 2016-08-30 MED ORDER — CEFAZOLIN SODIUM-DEXTROSE 2-4 GM/100ML-% IV SOLN
2.0000 g | INTRAVENOUS | Status: AC
Start: 2016-08-30 — End: 2016-08-30
  Administered 2016-08-30: 2 g via INTRAVENOUS
  Filled 2016-08-30: qty 100

## 2016-08-30 SURGICAL SUPPLY — 30 items
BLADE SURG 15 STRL LF DISP TIS (BLADE) ×1 IMPLANT
BLADE SURG 15 STRL SS (BLADE) ×2
BRIEF STRETCH FOR OB PAD LRG (UNDERPADS AND DIAPERS) ×2 IMPLANT
COVER SURGICAL LIGHT HANDLE (MISCELLANEOUS) ×2 IMPLANT
DRAPE LAPAROTOMY T 102X78X121 (DRAPES) ×2 IMPLANT
DRSG PAD ABDOMINAL 8X10 ST (GAUZE/BANDAGES/DRESSINGS) ×2 IMPLANT
ELECT PENCIL ROCKER SW 15FT (MISCELLANEOUS) ×2 IMPLANT
ELECT REM PT RETURN 9FT ADLT (ELECTROSURGICAL) ×2
ELECTRODE REM PT RTRN 9FT ADLT (ELECTROSURGICAL) ×1 IMPLANT
GAUZE SPONGE 4X4 12PLY STRL (GAUZE/BANDAGES/DRESSINGS) ×2 IMPLANT
GAUZE SPONGE 4X4 16PLY XRAY LF (GAUZE/BANDAGES/DRESSINGS) ×2 IMPLANT
GLOVE ECLIPSE 8.0 STRL XLNG CF (GLOVE) ×2 IMPLANT
GLOVE INDICATOR 8.0 STRL GRN (GLOVE) ×2 IMPLANT
GOWN STRL REUS W/TWL XL LVL3 (GOWN DISPOSABLE) ×4 IMPLANT
KIT BASIN OR (CUSTOM PROCEDURE TRAY) ×2 IMPLANT
LUBRICANT JELLY K Y 4OZ (MISCELLANEOUS) ×2 IMPLANT
NEEDLE HYPO 22GX1.5 SAFETY (NEEDLE) ×2 IMPLANT
PACK BASIC VI WITH GOWN DISP (CUSTOM PROCEDURE TRAY) ×2 IMPLANT
SUCTION FRAZIER 12FR DISP (SUCTIONS) IMPLANT
SUT CHROMIC 2 0 SH (SUTURE) ×3 IMPLANT
SUT CHROMIC 3 0 SH 27 (SUTURE) IMPLANT
SUT VIC AB 2-0 UR6 27 (SUTURE) IMPLANT
SWAB COLLECTION DEVICE MRSA (MISCELLANEOUS) IMPLANT
SWAB CULTURE ESWAB REG 1ML (MISCELLANEOUS) IMPLANT
SYR 20CC LL (SYRINGE) ×2 IMPLANT
SYR 5ML LL (SYRINGE) IMPLANT
SYR BULB IRRIGATION 50ML (SYRINGE) IMPLANT
TOWEL OR 17X26 10 PK STRL BLUE (TOWEL DISPOSABLE) ×2 IMPLANT
TOWEL OR NON WOVEN STRL DISP B (DISPOSABLE) ×2 IMPLANT
YANKAUER SUCT BULB TIP 10FT TU (MISCELLANEOUS) ×2 IMPLANT

## 2016-08-30 NOTE — Op Note (Addendum)
08/30/2016  2:13 PM  PATIENT:  Beth [name]  45 y.o. female  Patient Care Team: Caryl Bis, MD as PCP - General (Unknown Physician Specialty) Michael Boston, MD as Consulting Physician (General Surgery) Wonda Horner, MD as Consulting Physician (Gastroenterology)  PRE-OPERATIVE DIAGNOSIS:  chronic anal fissure.  POST-OPERATIVE DIAGNOSIS:  chronic anal fissure  PROCEDURE:   LATERAL INTERNAL AND SPHINCTEROTOMY EXAM UNDER ANESTHESIA  SURGEON:  Surgeon(s): Michael Boston, MD  ASSISTANT: Olene Floss, PA-S, [facility]  ANESTHESIA:   Local field block Anorectal block General  0.25% bupivacaine with epinephrine at the beginning of the case.  Liposomal bupivacaine (Experel) at the end of the case.  EBL:  Total I/O In: -  Out: 5 [Blood:5]  Delay start of Pharmacological VTE agent (>24hrs) due to surgical blood loss or risk of bleeding:  no  DRAINS: none   SPECIMEN:  Source of Specimen:  Sentinel tag   DISPOSITION OF SPECIMEN:  PATHOLOGY  COUNTS:  YES  PLAN OF CARE: Discharge to home after PACU  PATIENT DISPOSITION:  PACU - hemodynamically stable.  INDICATION: Patient with probable chronic anal fissure refractory to bowel regimen & medical management.  I recommended examination and surgical treatment:  The anatomy & physiology of the anorectal region was discussed.  The pathophysiology of anal fissure and differential diagnosis was discussed.  Natural history progression  was discussed.   I stressed the importance of a bowel regimen to have daily soft bowel movements to minimize progression of disease.     The patient's condition is not adequately controlled.  Non-operative treatment has not healed the fissure.  Therefore, I recommended examination under anesthesia for better examination to confirm the diagnosis and treat by lateral internal sphincterotomy to relax the spasm better & allow the fissure to heal.  Technique, benefits, alternatives were discussed.    I noted a good likelihood this will help address the problem.  Risks such as bleeding, pain, incontinence, recurrence, heart attack, death, and other risks were discussed.    Educational handouts further explaining the pathology, treatment options, and bowel regimen were given as well.  The patient expressed understanding & wishes to proceed with surgery.  OR FINDINGS: Patient had a posterior midline chronic anal fissure with a hypertensive sphincter & large sentinel anal tag.    Sphincterotomy location:  Left lateral anal canal.  66% distal internal sphincterotomy performed  IDESCRIPTION:   Informed consent was confirmed. Patient underwent general anesthesia without difficulty. Patient was placed into prone positioning.  The perianal region was prepped and draped in sterile fashion. Surgical timeout confirmed or plan.  I did digital rectal examination and then transitioned over to anoscopy to get a sense of the anatomy.  Patient had a classic posterior midline sentinel anal tag.  I identified an anal fissure in the posterior midline anal canal.  He was actually quite large.  Some fibrosis at the base.  The sphincter tone was increased.  No stricture.  No abscess located.  No fistula   I went ahead and proceeded with internal sphinterotmy technique.  I enetered through the anoderm of the left lateral anal canal longitudinally.  I identified the internal and external sphincters.  I elevated the internal sphincter.  I proceeded with a partial internal sphincterotomy starting distally and moving proximally using cautery.  Since this was a larger than average fissure, this involved the distal 66% thickness.  This provided improved relaxation of the anal sphincter.  I excised the fibrotic sentinel tag and some  of the fibrotic edges of the fissure.  Cauterized the base of the wound for hemostasis until it was a nice soft flat broad wound.  Hemostasis was good.  I closed the anal canal wound vertically  using a running 2-0 chromic suture to good result, leaving a 3 mm distal opening to allow drainage.  Hemostasis was excellent.  I reexamined the anal canal.   There is was no narrowing.  Hemostasis was excellent.  I repeated anoscopy and examination.  Hemostasis was good.  Patient is being extubated go to recovery room.  I discussed operative findings, updated the patient's status, discussed probable steps to recovery, and gave postoperative recommendations to the patient's family.  Recommendations were made.  Questions were answered.  They expressed understanding & appreciation.  [name], M.D., F.A.C.S. Gastrointestinal and Minimally Invasive Surgery [facility] [address], [place], [place] [address] [phone] Main / Paging

## 2016-08-30 NOTE — Transfer of Care (Signed)
Immediate Anesthesia Transfer of Care Note  Patient: Beth [name]  Procedure(s) Performed: Procedure(s): LATERAL INTERNAL AND SPHINCTEROTOMY (N/A) EXAM UNDER ANESTHESIA (N/A)  Patient Location: PACU  Anesthesia Type:General  Level of Consciousness: awake, alert  and oriented  Airway & Oxygen Therapy: Patient Spontanous Breathing and Patient connected to face mask oxygen  Post-op Assessment: Report given to RN and Post -op Vital signs reviewed and stable  Post vital signs: Reviewed and stable  Last Vitals:  Vitals:   08/30/16 1118  BP: (!) 145/92  Pulse: 83  Resp: 16  Temp: 36.6 C    Last Pain:  Vitals:   08/30/16 1118  TempSrc: Oral         Complications: No apparent anesthesia complications

## 2016-08-30 NOTE — Anesthesia Postprocedure Evaluation (Signed)
Anesthesia Post Note  Patient: Beth [name]  Procedure(s) Performed: Procedure(s) (LRB): LATERAL INTERNAL AND SPHINCTEROTOMY (N/A) EXAM UNDER ANESTHESIA (N/A)  Patient location during evaluation: PACU Anesthesia Type: General Level of consciousness: awake and alert Pain management: pain level controlled Vital Signs Assessment: post-procedure vital signs reviewed and stable Respiratory status: spontaneous breathing, nonlabored ventilation, respiratory function stable and patient connected to nasal cannula oxygen Cardiovascular status: blood pressure returned to baseline and stable Postop Assessment: no signs of nausea or vomiting Anesthetic complications: no    Last Vitals:  Vitals:   08/30/16 1513 08/30/16 1559  BP: 140/82 (!) 140/92  Pulse: 81 87  Resp: 14 14  Temp: 36.7 C 36.7 C    Last Pain:  Vitals:   08/30/16 1559  TempSrc: Oral  PainSc:                  Lida Berkery DAVID

## 2016-08-30 NOTE — Progress Notes (Signed)
Patient ambulated in hallway after sphincterotomy. Able to void in bathroom. Sitz bath sent home with patient. RN went over how to do sitz bath and how to sanitize after use. Family verbalizes understanding.

## 2016-08-30 NOTE — Anesthesia Preprocedure Evaluation (Addendum)
Anesthesia Evaluation  Patient identified by MRN, date of birth, ID band Patient awake    Reviewed: Allergy & Precautions, NPO status , Patient's Chart, lab work & pertinent test results  Airway Mallampati: I  TM Distance: >3 FB Neck ROM: Full    Dental   Pulmonary Current Smoker,    Pulmonary exam normal        Cardiovascular Normal cardiovascular exam     Neuro/Psych    GI/Hepatic   Endo/Other    Renal/GU      Musculoskeletal   Abdominal   Peds  Hematology   Anesthesia Other Findings   Reproductive/Obstetrics                             Anesthesia Physical Anesthesia Plan  ASA: II  Anesthesia Plan: General   Post-op Pain Management:    Induction: Intravenous  Airway Management Planned: Oral ETT  Additional Equipment:   Intra-op Plan:   Post-operative Plan: Extubation in OR  Informed Consent: I have reviewed the patients History and Physical, chart, labs and discussed the procedure including the risks, benefits and alternatives for the proposed anesthesia with the patient or authorized representative who has indicated his/her understanding and acceptance.     Plan Discussed with: CRNA and Surgeon  Anesthesia Plan Comments:         Anesthesia Quick Evaluation

## 2016-08-30 NOTE — Discharge Instructions (Signed)
ANORECTAL SURGERY:  POST OPERATIVE INSTRUCTIONS  ######################################################################  EAT Gradually transition to a high fiber diet with a fiber supplement over the next few weeks after discharge.  Start with a pureed / full liquid diet (see below)  WALK Walk an hour a day.  Control your pain to do that.    CONTROL PAIN Control pain so that you can walk, sleep, tolerate sneezing/coughing, go up/down stairs.  HAVE A BOWEL MOVEMENT DAILY Keep your bowels regular to avoid problems.  OK to try a laxative to override constipation.  OK to use an antidairrheal to slow down diarrhea.  Call if not better after 2 tries  CALL IF YOU HAVE PROBLEMS/CONCERNS Call if you are still struggling despite following these instructions. Call if you have concerns not answered by these instructions  ######################################################################    1. Take your usually prescribed home medications unless otherwise directed. 2. DIET: Follow a light bland diet the first 24 hours after arrival home, such as soup, liquids, crackers, etc.  Be sure to include lots of fluids daily.  Avoid fast food or heavy meals as your are more likely to get nauseated.  Eat a low fat the next few days after surgery.   3. PAIN CONTROL: a. Pain is best controlled by a usual combination of three different methods TOGETHER: i. Ice/Heat ii. Over the counter pain medication iii. Prescription pain medication b. Most patients will experience some swelling and discomfort in the anus/rectal area. and incisions.  Ice packs or heat (30-60 minutes up to 6 times a day) will help. Use ice for the first few days to help decrease swelling and bruising, then switch to heat such as warm towels, sitz baths, warm baths, etc to help relax tight/sore spots and speed recovery.  Some people prefer to use ice alone, heat alone, alternating between ice & heat.  Experiment to what works for you.   Swelling and bruising can take several weeks to resolve.   c. It is helpful to take an over-the-counter pain medication regularly for the first few weeks.  Choose one of the following that works best for you: i. Naproxen (Aleve, etc)  Two '220mg'$  tabs twice a day ii. Ibuprofen (Advil, etc) Three '200mg'$  tabs four times a day (every meal & bedtime) iii. Acetaminophen (Tylenol, etc) 500-'650mg'$  four times a day (every meal & bedtime) d. A  prescription for pain medication (such as oxycodone, hydrocodone, etc) should be given to you upon discharge.  Take your pain medication as prescribed.  i. If you are having problems/concerns with the prescription medicine (does not control pain, nausea, vomiting, rash, itching, etc), please call us [phone] to see if we need to switch you to a different pain medicine that will work better for you and/or control your side effect better. ii. If you need a refill on your pain medication, please contact your pharmacy.  They will contact our office to request authorization. Prescriptions will not be filled after 5 pm or on week-ends.  Use a Sitz Bath 4-8 times a day for relief   [employer] A sitz bath is a warm water bath taken in the sitting position that covers only the hips and buttocks. It may be used for either healing or hygiene purposes. Sitz baths are also used to relieve pain, itching, or muscle spasms. The water may contain medicine. Moist heat will help you heal and relax.  HOME CARE INSTRUCTIONS  Take 3 to 4 sitz baths a day. 1. Fill the bathtub  half full with warm water. 2. Sit in the water and open the drain a little. 3. Turn on the warm water to keep the tub half full. Keep the water running constantly. 4. Soak in the water for 15 to 20 minutes. 5. After the sitz bath, pat the affected area dry first.   4. KEEP YOUR BOWELS REGULAR a. The goal is one bowel movement a day b. Avoid getting constipated.  Between the surgery and the pain medications, it  is common to experience some constipation.  Increasing fluid intake and taking a fiber supplement (such as Metamucil, Citrucel, FiberCon, MiraLax, etc) 1-2 times a day regularly will usually help prevent this problem from occurring.  A mild laxative (prune juice, Milk of Magnesia, MiraLax, etc) should be taken according to package directions if there are no bowel movements after 48 hours. c. Watch out for diarrhea.  If you have many loose bowel movements, simplify your diet to bland foods & liquids for a few days.  Stop any stool softeners and decrease your fiber supplement.  Switching to mild anti-diarrheal medications (Kayopectate, Pepto Bismol) can help.  If this worsens or does not improve, please call us.  5. Wound Care  a. Remove your bandages the day after surgery.  Unless discharge instructions indicate otherwise, leave your bandage dry and in place overnight.  Remove the bandage during your first bowel movement.   b. Wear an absorbent pad or soft cotton gauze in your underwear as needed to catch any drainage and help keep the area  c. Keep the area clean and dry.  Bathe / shower every day.  Keep the area clean by showering / bathing over the incision / wound.   It is okay to soak an open wound to help wash it.  Wet wipes or showers / gentle washing after bowel movements is often less traumatic than regular toilet paper. d. Dennis Bast will often notice bleeding with bowel movements.  This should slow down by the end of the first week of surgery e. Expect some drainage.  This should slow down, too, by the end of the first week of surgery.  Wear an absorbent pad or soft cotton gauze in your underwear until the drainage stops.  6. ACTIVITIES as tolerated:   a. You may resume regular (light) daily activities beginning the next day--such as daily self-care, walking, climbing stairs--gradually increasing activities as tolerated.  If you can walk 30 minutes without difficulty, it is safe to try more intense  activity such as jogging, treadmill, bicycling, low-impact aerobics, swimming, etc. b. Save the most intensive and strenuous activity for last such as sit-ups, heavy lifting, contact sports, etc  Refrain from any heavy lifting or straining until you are off narcotics for pain control.   c. DO NOT PUSH THROUGH PAIN.  Let pain be your guide: If it hurts to do something, don't do it.  Pain is your body warning you to avoid that activity for another week until the pain goes down. d. You may drive when you are no longer taking prescription pain medication, you can comfortably sit for long periods of time, and you can safely maneuver your car and apply brakes. e. Dennis Bast may have sexual intercourse when it is comfortable.  7. FOLLOW UP in our office a. Please call [facility] at [phone] to set up an appointment to see your surgeon in the office for a follow-up appointment approximately 2 weeks after your surgery. b. Make sure that you call for  this appointment the day you arrive home to insure a convenient appointment time. 10. IF YOU HAVE DISABILITY OR FAMILY LEAVE FORMS, BRING THEM TO THE OFFICE FOR PROCESSING.  DO NOT GIVE THEM TO YOUR DOCTOR.        WHEN TO CALL us [phone]: 1. Poor pain control 2. Reactions / problems with new medications (rash/itching, nausea, etc)  3. Fever over 101.5 F (38.5 C) 4. Inability to urinate 5. Nausea and/or vomiting 6. Worsening swelling or bruising 7. Continued bleeding from incision. 8. Increased pain, redness, or drainage from the incision  The clinic staff is available to answer your questions during regular business hours (8:30am-5pm).  Please dont hesitate to call and ask to speak to one of our nurses for clinical concerns.   A surgeon from [facility] is always on call at the hospitals   If you have a medical emergency, go to the nearest emergency room or call 911.    [facility], [place], [place],  [place], [place]  [address] ? MAIN: [phone] ? TOLL FREE: [phone] ? FAX [phone] [email or web address]  WHAT IS AN ANAL FISSURE? An anal fissure (fissure-in-ano) is a small, oval shaped tear in skin that lines the opening of the anus. Fissures typically cause severe pain and bleeding with bowel movements. Fissures are quite common in the general population, but are often confused with other causes of pain and bleeding, such as hemorrhoids.  WHAT ARE THE SYMPTOMS OF AN ANAL FISSURE? The typical symptoms of an anal fissure include severe pain during, and especially after, a bowel movement, lasting from several minutes to a few hours. Patients may also notice bright red blood from the anus that can be seen on the toilet paper or on the stool. Between bowel movements, patients with anal fissures are often relatively symptom-free. Many patients are fearful of having a bowel movement and may try to avoid defecation secondary to the pain.   WHAT CAUSES AN ANAL FISSURE? Fissures are usually caused by trauma to the inner lining of the anus. Patients with tight anal sphincter muscles (i.e., increased muscle tone) are more prone to developing anal fissures. A hard, dry bowel movement is typically responsible, but loose stools and diarrhea can also be the cause. Following a bowel movement, severe anal pain can produce spasm of the anal sphincter muscle, resulting in a decrease in blood flow to the site of the injury, thus impairing healing of the wound. The next bowel movement results in more pain, anal spasm, decreased blood flow to the area, and the cycle continues. Treatments are aimed at interrupting this cycle by relaxing the anal sphincter muscle to promote healing of the fissure.   Other, less common, causes include inflammatory conditions and certain anal infections or tumors. Anal fissures may be acute (recent onset) or chronic (present for a long period of time). Chronic fissures  may be more difficult to treat, and may also have an external lump associated with the tear, called a sentinel pile or skin tag, as well as extra tissue just inside the anal canal (hypertrophied papilla) .  WHAT IS THE TREATMENT OF ANAL FISSURES? The majority of anal fissures do not require surgery. The most common treatment for an acute anal fissure consists of making the stool more formed and bulky with a diet high in fiber and utilization of over-the-counter fiber supplementation (totaling 25-35 grams of fiber/day). Stool softeners and increasing water intake may be necessary to promote  soft bowel movements and aid in the healing process. Topical anesthetics for pain and warm tub baths (sitz baths) for 10-20 minutes several times a day (especially after bowel movements) are soothing and promote relaxation of the anal muscles, which may help the healing process.  Other medications (such as diltiazem) may be prescribed that allow relaxation of the anal sphincter muscles. Your surgeon will go over benefits and side-effects of each of these with you. Narcotic pain medications are not recommended for anal fissures, as they promote constipation. Chronic fissures are generally more difficult to treat, and your surgeon may advise surgical treatment.  WILL THE PROBLEM RETURN? Fissures can recur easily, and it is quite common for a fully healed fissure to recur after a hard bowel movement or other trauma. Even when the pain and bleeding have subsided, it is very important to continue good bowel habits and a diet high in fiber as a lifestyle change. If the problem returns without an obvious cause, further assessment is warranted.  GETTING TO GOOD BOWEL HEALTH. Irregular bowel habits such as constipation and diarrhea can lead to many problems over time.  Having one soft bowel movement a day is the most important way to prevent further problems.  The anorectal canal is designed to handle stretching and feces to  safely manage our ability to get rid of solid waste (feces, poop, stool) out of our body.  BUT, hard constipated stools can act like ripping concrete bricks and diarrhea can be a burning fire to this very sensitive area of our body, causing inflamed hemorrhoids, anal fissures, increasing risk is perirectal abscesses, abdominal pain/bloating, an making irritable bowel worse.     The goal: ONE SOFT BOWEL MOVEMENT A DAY!  To have soft, regular bowel movements:   Drink at least 8 tall glasses of water a day.    Take plenty of fiber.  Fiber is the undigested part of plant food that passes into the colon, acting s natures broom to encourage bowel motility and movement.  Fiber can absorb and hold large amounts of water. This results in a larger, bulkier stool, which is soft and easier to pass. Work gradually over several weeks up to 6 servings a day of fiber (25g a day even more if needed) in the form of: o Vegetables -- Root (potatoes, carrots, turnips), leafy green (lettuce, salad greens, celery, spinach), or cooked high residue (cabbage, broccoli, etc) o Fruit -- Fresh (unpeeled skin & pulp), Dried (prunes, apricots, cherries, etc ),  or stewed ( applesauce)  o Whole grain breads, pasta, etc (whole wheat)  o Bran cereals   Bulking Agents -- This type of water-retaining fiber generally is easily obtained each day by one of the following:  o Psyllium bran -- The psyllium plant is remarkable because its ground seeds can retain so much water. This product is available as Metamucil, Konsyl, Effersyllium, Per Diem Fiber, or the less expensive generic preparation in drug and health food stores. Although labeled a laxative, it really is not a laxative.  o Methylcellulose -- This is another fiber derived from wood which also retains water. It is available as Citrucel. o Polyethylene Glycol - and artificial fiber commonly called Miralax or Glycolax.  It is helpful for people with gassy or bloated feelings with  regular fiber o Flax Seed - a less gassy fiber than psyllium  No reading or other relaxing activity while on the toilet. If bowel movements take longer than 5 minutes, you are too constipated  AVOID CONSTIPATION.  High fiber and water intake usually takes care of this.  Sometimes a laxative is needed to stimulate more frequent bowel movements, but   Laxatives are not a good long-term solution as it can wear the colon out. o Osmotics (Milk of Magnesia, Fleets phosphosoda, Magnesium citrate, MiraLax, GoLytely) are safer than  o Stimulants (Senokot, Castor Oil, Dulcolax, Ex Lax)    o Do not take laxatives for more than 7days in a row.   IF SEVERELY CONSTIPATED, try a Bowel Retraining Program: o Do not use laxatives.  o Eat a diet high in roughage, such as bran cereals and leafy vegetables.  o Drink six (6) ounces of prune or apricot juice each morning.  o Eat two (2) large servings of stewed fruit each day.  o Take one (1) heaping tablespoon of a psyllium-based bulking agent twice a day. Use sugar-free sweetener when possible to avoid excessive calories.  o Eat a normal breakfast.  o Set aside 15 minutes after breakfast to sit on the toilet, but do not strain to have a bowel movement.  o If you do not have a bowel movement by the third day, use an enema and repeat the above steps.   Controlling diarrhea o Switch to liquids and simpler foods for a few days to avoid stressing your intestines further. o Avoid dairy products (especially milk & ice cream) for a short time.  The intestines often can lose the ability to digest lactose when stressed. o Avoid foods that cause gassiness or bloating.  Typical foods include beans and other legumes, cabbage, broccoli, and dairy foods.  Every person has some sensitivity to other foods, so listen to our body and avoid those foods that trigger problems for you. o Adding fiber (Citrucel, Metamucil, psyllium, Miralax) gradually can help thicken stools by  absorbing excess fluid and retrain the intestines to act more normally.  Slowly increase the dose over a few weeks.  Too much fiber too soon can backfire and cause cramping & bloating. o Probiotics (such as active yogurt, Align, etc) may help repopulate the intestines and colon with normal bacteria and calm down a sensitive digestive tract.  Most studies show it to be of mild help, though, and such products can be costly. o Medicines: - Bismuth subsalicylate (ex. Kayopectate, Pepto Bismol) every 30 minutes for up to 6 doses can help control diarrhea.  Avoid if pregnant. - Loperamide (Immodium) can slow down diarrhea.  Start with two tablets ('4mg'$  total) first and then try one tablet every 6 hours.  Avoid if you are having fevers or severe pain.  If you are not better or start feeling worse, stop all medicines and call your doctor for advice o Call your doctor if you are getting worse or not better.  Sometimes further testing (cultures, endoscopy, X-ray studies, bloodwork, etc) may be needed to help diagnose and treat the cause of the diarrhea. o   WHAT CAN BE DONE IF THE FISSURE DOES NOT HEAL? A fissure that fails to respond to conservative measures should be re-examined. Persistent hard or loose bowel movements, scarring, or spasm of the internal anal muscle all contribute to delayed healing. Other medical problems such as inflammatory bowel disease (Crohns disease), infections, or anal tumors can cause symptoms similar to anal fissures. Patients suffering from persistent anal pain should be examined to exclude these symptoms. This may include a colonoscopy or an exam in the operating room under anesthesia.  WHAT DOES SURGERY INVOLVE? Surgical options  for treating anal fissure include Botulinum toxin (Botox) injection into the anal sphincter and surgical division of a portion of the internal anal sphincter (lateral internal sphincterotomy). Both of these are performed typically as outpatient, same-day  procedures, or occasionally in the office setting. The goal of these surgical options is to promote relaxation of the anal sphincter, thereby decreasing anal pain and spasm, allowing the fissure to heal. Botox injection results in healing in 50-80% of patients, while sphincterotomy is reported to be over 90% successful. If a sentinel pile is present, it may be removed to promote healing of the fissure. All surgical procedures carry some risk, and a sphincterotomy can rarely interfere with ones ability to control gas and stool. Your colon and rectal surgeon will discuss these risks with you to determine the appropriate treatment for your particular situation.  HOW LONG IS THE RECOVERY AFTER SURGERY? It is important to note that complete healing with both medical and surgical treatments can take up to approximately 6-10 weeks. However, acute pain after surgery often disappears after a few days. Most patients will be able to return to work and resume daily activities in a few short days after the surgery.  CAN FISSURES LEAD TO COLON CANCER? Absolutely not. Persistent symptoms, however, need careful evaluation since other conditions other than an anal fissure can cause similar symptoms. Your colon and rectal surgeon may request additional tests, even if your fissure has successfully healed. A colonoscopy may be required to exclude other causes of rectal bleeding.

## 2016-08-30 NOTE — Interval H&P Note (Signed)
History and Physical Interval Note:  08/30/2016 12:59 PM  Beth [name]  has presented today for surgery, with the diagnosis of chronic anal fissure.  The various methods of treatment have been discussed with the patient and family. After consideration of risks, benefits and other options for treatment, the patient has consented to  Procedure(s): LATERAL INTERNAL AND SPHINCTEROTOMY (N/A) EXAM UNDER ANESTHESIA (N/A) as a surgical intervention .  The patient's history has been reviewed, patient examined, no change in status, stable for surgery.  I have reviewed the patient's chart and labs.  Questions were answered to the patient's satisfaction.     Marlita Keil C.

## 2016-08-30 NOTE — H&P (View-Only) (Signed)
Beth [name] 08/08/2016 8:27 AM Location: [facility] Patient #: [id number] DOB: 30-Sep-1971 Married / Language: English / Race: White Female  Patient Care Team: Beth Bis, MD as PCP - General (Unknown Physician Specialty) Beth Boston, MD as Consulting Physician (General Surgery) Beth Horner, MD as Consulting Physician (Gastroenterology)   History of Present Illness Beth Hector MD; 08/08/2016 9:07 AM) The patient is a 45 year old female who presents with anal pain. Note for "Anal pain": Patient sent for surgical consultation by Dr. Penelope Coop with [facility] gastroenterology. 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  Pleasant smoking female. Usually moves her bowels about twice a week. She's had intermittent episodes of severe anal pain for the trial 12 months. Usually coughs down but in the past few months it's been constant. Exquisitely painful with bowel movements. Concerned. Was given steroid suppositories by primary care office to see if that would help. It did not. Eventually sent to see gastroenterology. Suspicion for an anal fissure. Started on diltiazem cream. She's been placing it perianally 4 times a day for at least 3 weeks. It has not resolved. Based on this, surgical consultation recommended.   She had a colonoscopy in 2015 that was otherwise okay. She's had no anorectal issues. Does not recall any hemorrhoidal flares. She can walk a half hour without difficulty. No personal nor family history of GI/colon cancer, inflammatory bowel disease, irritable bowel syndrome, allergy such as Celiac Sprue, dietary/dairy problems, colitis, ulcers nor gastritis. No recent sick contacts/gastroenteritis. No travel outside the country. No changes in diet. No dysphagia to solids or liquids. No significant heartburn or reflux. No hematochezia, hematemesis, coffee ground emesis. No evidence of prior gastric/peptic ulceration.   Other Problems Beth [name], CMA;  08/08/2016 8:28 AM) No pertinent past medical history  Past Surgical History Beth [name], [place]; 08/08/2016 8:28 AM) Hysterectomy (not due to cancer) - Partial  Diagnostic Studies History Beth [name], CMA; 08/08/2016 8:28 AM) Colonoscopy 1-5 years ago Mammogram 1-3 years ago Pap Smear 1-5 years ago  Allergies Beth [name], CMA; 08/08/2016 8:29 AM) Sulfa Antibiotics  Medication History (Sonya Bynum, CMA; 08/08/2016 8:29 AM) ValACYclovir HCl ('500MG'$  Tablet, Oral as needed) Active. Multivitamin Adult (Oral) Active. Medications Reconciled  Social History Beth [name], CMA; 08/08/2016 8:28 AM) Alcohol use Occasional alcohol use. Caffeine use Coffee. No drug use Tobacco use Current every day smoker.  Family History Beth [name], CMA; 08/08/2016 8:28 AM) Diabetes Mellitus Daughter, Father.  Pregnancy / Birth History Beth [name], [place]; 08/08/2016 8:28 AM) Age at menarche 43 years. Gravida 3 Maternal age 76-25 Para 3     Review of Systems ([place]; 08/08/2016 8:28 AM) General Not Present- Appetite Loss, Chills, Fatigue, Fever, Night Sweats, Weight Gain and Weight Loss. Skin Present- Dryness. Not Present- Change in Wart/Mole, Hives, Jaundice, New Lesions, Non-Healing Wounds, Rash and Ulcer. HEENT Present- Seasonal Allergies and Wears glasses/contact lenses. Not Present- Earache, Hearing Loss, Hoarseness, Nose Bleed, Oral Ulcers, Ringing in the Ears, Sinus Pain, Sore Throat, Visual Disturbances and Yellow Eyes. Respiratory Not Present- Bloody sputum, Chronic Cough, Difficulty Breathing, Snoring and Wheezing. Breast Not Present- Breast Mass, Breast Pain, Nipple Discharge and Skin Changes. Cardiovascular Not Present- Chest Pain, Difficulty Breathing Lying Down, Leg Cramps, Palpitations, Rapid Heart Rate, Shortness of Breath and Swelling of Extremities. Gastrointestinal Present- Abdominal Pain, Bloating, Constipation, Nausea and Rectal Pain. Not Present- Bloody Stool,  Change in Bowel Habits, Chronic diarrhea, Difficulty Swallowing, Excessive gas, Gets full quickly at meals, Hemorrhoids, Indigestion and Vomiting. Female Genitourinary Not  Present- Frequency, Nocturia, Painful Urination, Pelvic Pain and Urgency. Musculoskeletal Not Present- Back Pain, Joint Pain, Joint Stiffness, Muscle Pain, Muscle Weakness and Swelling of Extremities. Neurological Not Present- Decreased Memory, Fainting, Headaches, Numbness, Seizures, Tingling, Tremor, Trouble walking and Weakness. Psychiatric Not Present- Anxiety, Bipolar, Change in Sleep Pattern, Depression, Fearful and Frequent crying. Endocrine Not Present- Cold Intolerance, Excessive Hunger, Hair Changes, Heat Intolerance, Hot flashes and New Diabetes. Hematology Not Present- Blood Thinners, Easy Bruising, Excessive bleeding, Gland problems, HIV and Persistent Infections.  Vitals (Sonya Bynum CMA; 08/08/2016 8:28 AM) 08/08/2016 8:28 AM Weight: 150 lb Height: 65in Body Surface Area: 1.75 m Body Mass Index: 24.96 kg/m  Temp.: 97.62F(Temporal)  Pulse: 79 (Regular)  BP: 124/76 (Sitting, Left Arm, Standard)      Physical Exam Beth Hector MD; 08/08/2016 8:59 AM)  General Mental Status-Alert. General Appearance-Not in acute distress, Not Sickly. Orientation-Oriented X3. Hydration-Well hydrated. Voice-Normal. Note: Not toxic.  Integumentary Global Assessment Upon inspection and palpation of skin surfaces of the - Axillae: non-tender, no inflammation or ulceration, no drainage. and Distribution of scalp and body hair is normal. General Characteristics Temperature - normal warmth is noted.  Head and Neck Head-normocephalic, atraumatic with no lesions or palpable masses. Face Global Assessment - atraumatic, no absence of expression. Neck Global Assessment - no abnormal movements, no bruit auscultated on the right, no bruit auscultated on the left, no decreased range of motion,  non-tender. Trachea-midline. Thyroid Gland Characteristics - non-tender.  Eye Eyeball - Left-Extraocular movements intact, No Nystagmus. Eyeball - Right-Extraocular movements intact, No Nystagmus. Cornea - Left-No Hazy. Cornea - Right-No Hazy. Sclera/Conjunctiva - Left-No scleral icterus, No Discharge. Sclera/Conjunctiva - Right-No scleral icterus, No Discharge. Pupil - Left-Direct reaction to light normal. Pupil - Right-Direct reaction to light normal.  ENMT Ears Pinna - Left - no drainage observed, no generalized tenderness observed. Right - no drainage observed, no generalized tenderness observed. Nose and Sinuses External Inspection of the Nose - no destructive lesion observed. Inspection of the nares - Left - quiet respiration. Right - quiet respiration. Mouth and Throat Lips - Upper Lip - no fissures observed, no pallor noted. Lower Lip - no fissures observed, no pallor noted. Nasopharynx - no discharge present. Oral Cavity/Oropharynx - Tongue - no dryness observed. Oral Mucosa - no cyanosis observed. Hypopharynx - no evidence of airway distress observed.  Chest and Lung Exam Inspection Movements - Normal and Symmetrical. Accessory muscles - No use of accessory muscles in breathing. Palpation Palpation of the chest reveals - Non-tender. Auscultation Breath sounds - Normal and Clear.  Cardiovascular Auscultation Rhythm - Regular. Murmurs & Other Heart Sounds - Auscultation of the heart reveals - No Murmurs and No Systolic Clicks.  Abdomen Inspection Inspection of the abdomen reveals - No Visible peristalsis and No Abnormal pulsations. Umbilicus - No Bleeding, No Urine drainage. Palpation/Percussion Palpation and Percussion of the abdomen reveal - Soft, Non Tender, No Rebound tenderness, No Rigidity (guarding) and No Cutaneous hyperesthesia.  Female Genitourinary Sexual Maturity Tanner 5 - Adult hair pattern. Note: No vaginal bleeding nor  discharge  Rectal Note: Posterior midline anal tag with increased sphincter stone. Very sensitive posterior midline. Strongly suspicious for anal fissure.   Exam done with assistance of female Medical Assistant in the room. Perianal skin clean with good hygiene. No pruritis ani. No pilonidal disease. No abscess/fistula. No external hemorrhoids. Held off on any digital or anoscopic exam.  Peripheral Vascular Upper Extremity Inspection - Left - No Cyanotic nailbeds, Not Ischemic. Right - No  Cyanotic nailbeds, Not Ischemic.  Neurologic Neurologic evaluation reveals -normal attention span and ability to concentrate, able to name objects and repeat phrases. Appropriate fund of knowledge , normal sensation and normal coordination. Mental Status Affect - not angry, not paranoid. Cranial Nerves-Normal Bilaterally. Gait-Normal.  Neuropsychiatric Mental status exam performed with findings of-able to articulate well with normal speech/language, rate, volume and coherence, thought content normal with ability to perform basic computations and apply abstract reasoning and no evidence of hallucinations, delusions, obsessions or homicidal/suicidal ideation. Note: Quiet. A little sad.  Musculoskeletal Global Assessment Spine, Ribs and Pelvis - no instability, subluxation or laxity. Right Upper Extremity - no instability, subluxation or laxity.  Lymphatic Head & Neck  General Head & Neck Lymphatics: Bilateral - Description - No Localized lymphadenopathy. Axillary  General Axillary Region: Bilateral - Description - No Localized lymphadenopathy. Femoral & Inguinal  Generalized Femoral & Inguinal Lymphatics: Left - Description - No Localized lymphadenopathy. Right - Description - No Localized lymphadenopathy.    Assessment & Plan Beth Hector MD; 08/08/2016 9:01 AM)  ANAL FISSURE (K60.2) Impression: Rather classic history for anal fissure with intermittent pain over the past  year.  She is tried muscle relaxants antispasmodic therapy for times a day. Diltiazem is not healed fistula.  Think she requires a more aggressive intervention. We do examination under anesthesia with partial internal sphincterotomy. She is interested in proceeding.  Current Plans Pt Education - [facility] Anal Fissure ([place]) ENCOUNTER FOR PREOPERATIVE EXAMINATION FOR GENERAL SURGICAL PROCEDURE (Z01.818)  Current Plans You are being scheduled for surgery - Our schedulers will call you.  You should hear from our office's scheduling department within 5 working days about the location, date, and time of surgery. We try to make accommodations for patient's preferences in scheduling surgery, but sometimes the OR schedule or the surgeon's schedule prevents [place] from making those accommodations.  If you have not heard from our office [phone]) in 5 working days, call the office and ask for your surgeon's nurse.  If you have other questions about your diagnosis, plan, or surgery, call the office and ask for your surgeon's nurse.  Pt Education - CCS Rectal Prep for Anorectal outpatient/office surgery: discussed with patient and provided information. Pt Education - CCS Rectal Surgery HCI ([place]): discussed with patient and provided information. Pt Education - CCS Hemorrhoids (Oshea Percival): discussed with patient and provided information. TOBACCO ABUSE (Z72.0) Impression: I noted that smokers have increased pain and poor wound healing.  I strongly recommend she quit smoking to break the cycle of wound problems and other health issues.  Current Plans Pt Education - CCS STOP SMOKING! CHRONIC CONSTIPATION (K59.09) Impression: Note this is the major risk factor for having further anorectal problems.  I strongly recommend she increase her MiraLAX until she's having 1 soft bowel movement a day.  If she is on high dose of MiraLAX without improvement, consider seeing gastroenterology to see if antispasmodics  or promotility agents or different bowel regimen would be of use.  Current Plans Pt Education - CCS Constipation (AT) Pt Education - CCS Good Bowel Health (Janny Crute)  Beth [name], M.D., F.A.C.S. Gastrointestinal and Minimally Invasive Surgery [facility] [address] [place], [place] [address] [phone] Main / Paging

## 2016-08-30 NOTE — Anesthesia Procedure Notes (Signed)
Procedure Name: Intubation Date/Time: 08/30/2016 1:12 PM Performed by: Glory Buff Pre-anesthesia Checklist: Patient identified, Emergency Drugs available, Suction available and Patient being monitored Patient Re-evaluated:Patient Re-evaluated prior to inductionOxygen Delivery Method: Circle system utilized Preoxygenation: Pre-oxygenation with 100% oxygen Intubation Type: IV induction Ventilation: Mask ventilation without difficulty Laryngoscope Size: Miller and 3 Grade View: Grade I Tube type: Oral Tube size: 7.0 mm Number of attempts: 1 Airway Equipment and Method: Stylet and Oral airway Placement Confirmation: ETT inserted through vocal cords under direct vision,  positive ETCO2 and breath sounds checked- equal and bilateral Secured at: 20 cm Tube secured with: Tape Dental Injury: Teeth and Oropharynx as per pre-operative assessment

## 2016-09-22 DIAGNOSIS — Z6824 Body mass index (BMI) 24.0-24.9, adult: Secondary | ICD-10-CM | POA: Diagnosis not present

## 2016-09-22 DIAGNOSIS — J069 Acute upper respiratory infection, unspecified: Secondary | ICD-10-CM | POA: Diagnosis not present

## 2016-09-22 DIAGNOSIS — R05 Cough: Secondary | ICD-10-CM | POA: Diagnosis not present

## 2016-09-22 DIAGNOSIS — F17210 Nicotine dependence, cigarettes, uncomplicated: Secondary | ICD-10-CM | POA: Diagnosis not present

## 2016-10-07 DIAGNOSIS — D013 Carcinoma in situ of anus and anal canal: Secondary | ICD-10-CM | POA: Insufficient documentation

## 2016-10-10 ENCOUNTER — Other Ambulatory Visit: Payer: Self-pay | Admitting: Obstetrics and Gynecology

## 2016-10-10 DIAGNOSIS — Z1231 Encounter for screening mammogram for malignant neoplasm of breast: Secondary | ICD-10-CM

## 2016-10-23 DIAGNOSIS — R740 Nonspecific elevation of levels of transaminase and lactic acid dehydrogenase [LDH]: Secondary | ICD-10-CM | POA: Diagnosis not present

## 2016-11-08 DIAGNOSIS — Z01419 Encounter for gynecological examination (general) (routine) without abnormal findings: Secondary | ICD-10-CM | POA: Diagnosis not present

## 2016-12-13 DIAGNOSIS — J209 Acute bronchitis, unspecified: Secondary | ICD-10-CM | POA: Diagnosis not present

## 2016-12-13 DIAGNOSIS — Z6825 Body mass index (BMI) 25.0-25.9, adult: Secondary | ICD-10-CM | POA: Diagnosis not present

## 2017-01-11 ENCOUNTER — Ambulatory Visit
Admission: RE | Admit: 2017-01-11 | Discharge: 2017-01-11 | Disposition: A | Discharge status: Home / Self Care | Payer: BLUE CROSS/BLUE SHIELD | Source: Ambulatory Visit | Attending: Obstetrics and Gynecology | Admitting: Obstetrics and Gynecology

## 2017-01-11 DIAGNOSIS — Z1231 Encounter for screening mammogram for malignant neoplasm of breast: Secondary | ICD-10-CM | POA: Diagnosis not present

## 2017-01-11 IMAGING — MG MM DIGITAL SCREENING BILAT W/ CAD
6 series · 6 of 6 positions shown · non-contrast
Comparison: Previous exam(s).

CLINICAL DATA: Screening.

EXAM:
DIGITAL SCREENING BILATERAL MAMMOGRAM WITH CAD

[L XCCL]
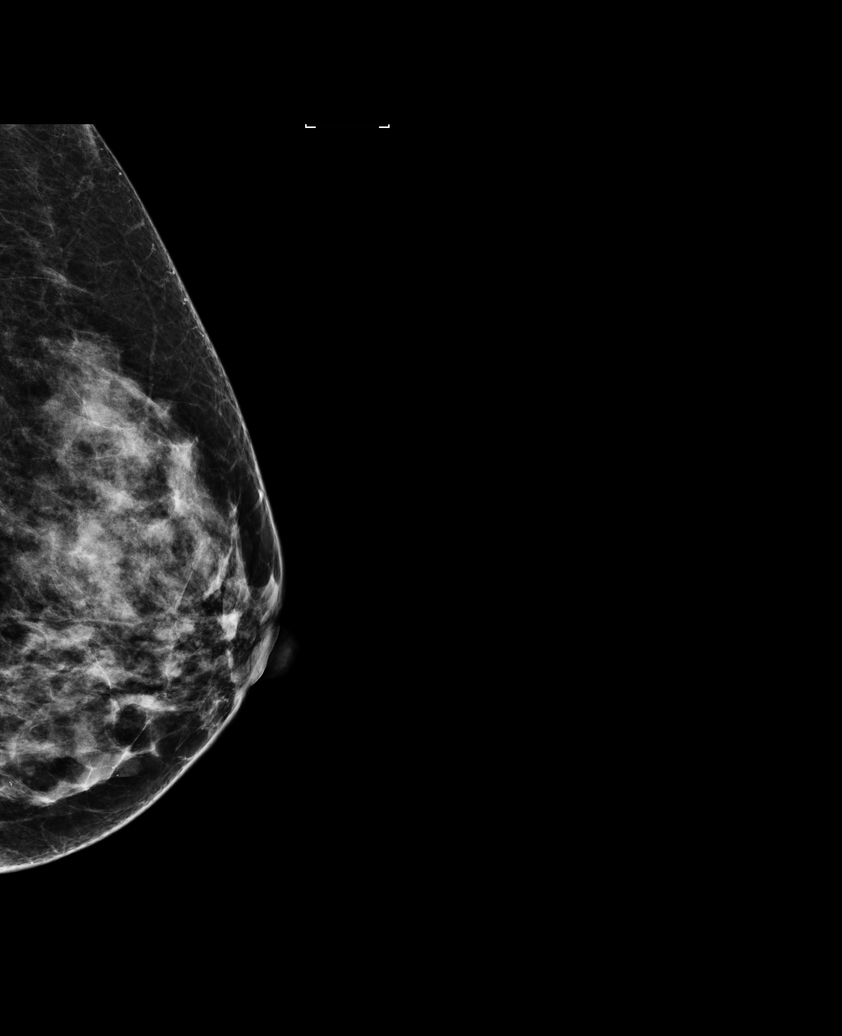

[L MLO]
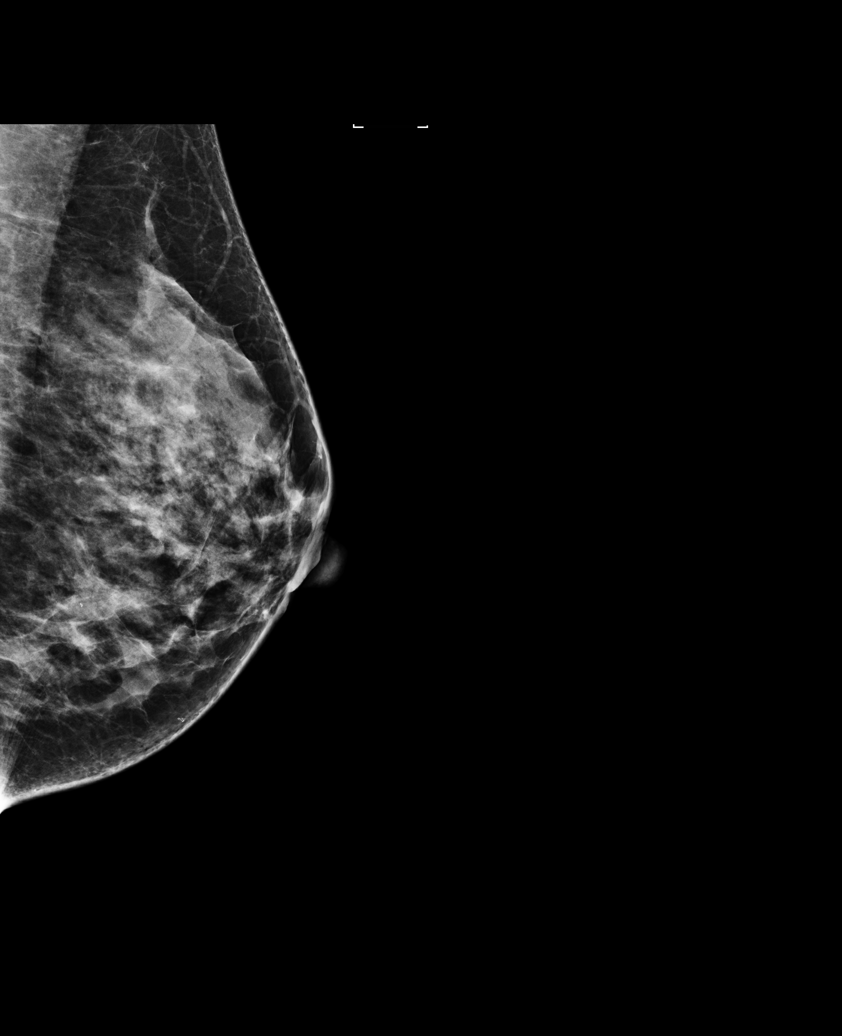

[L CC]
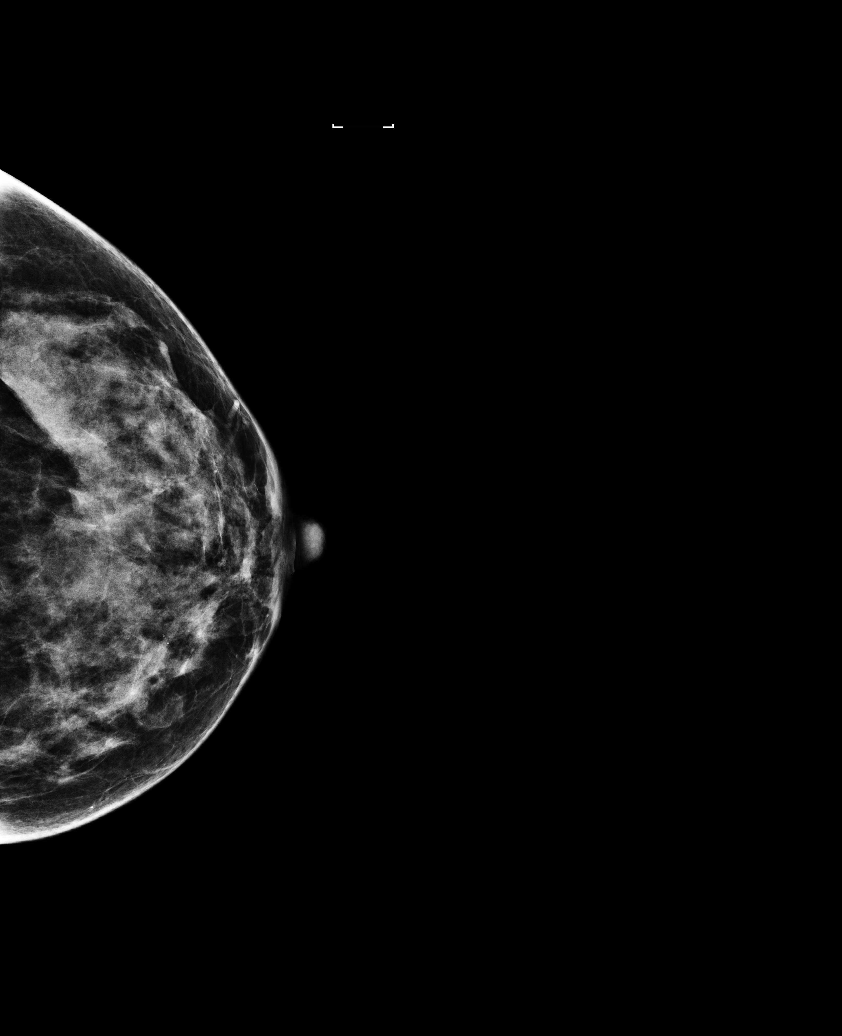

[R MLO]
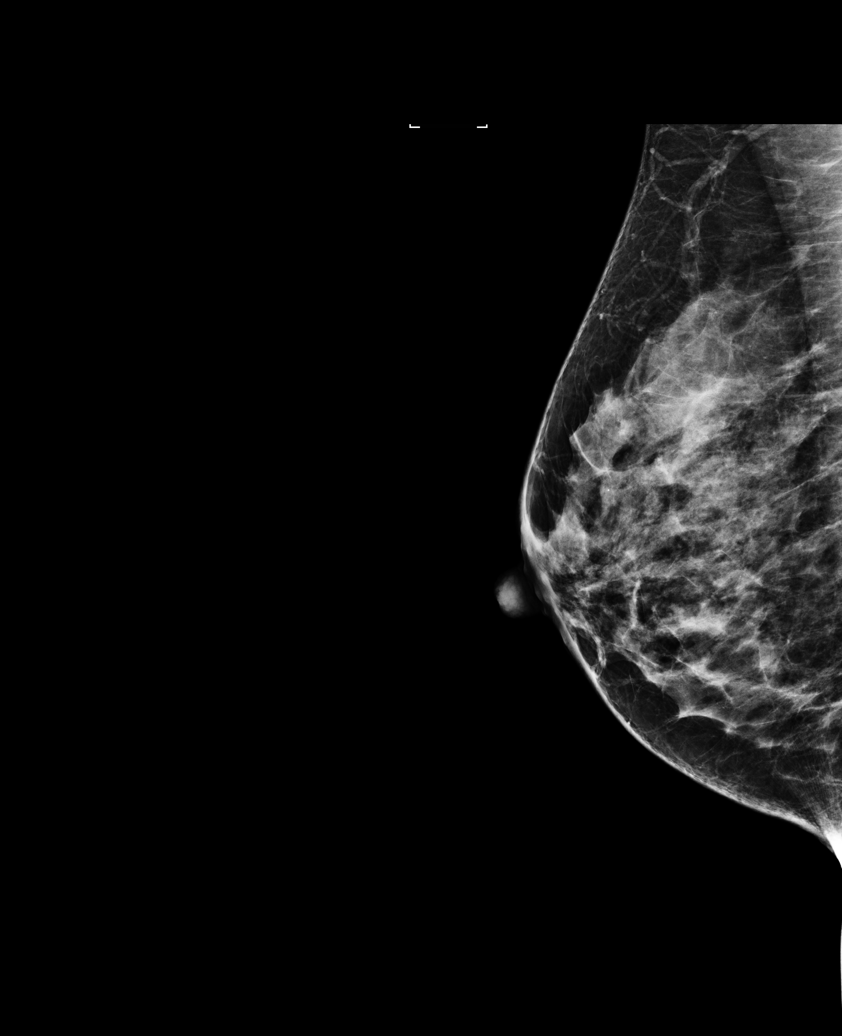

[R XCCL]
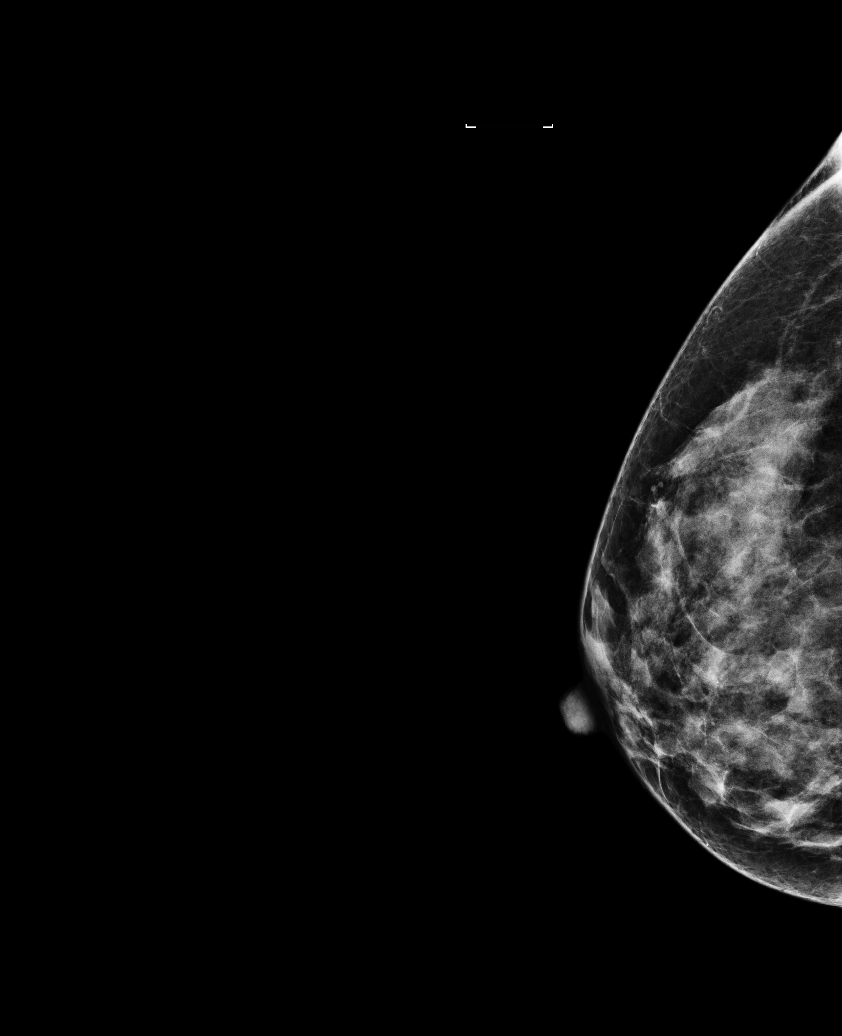

[R CC]
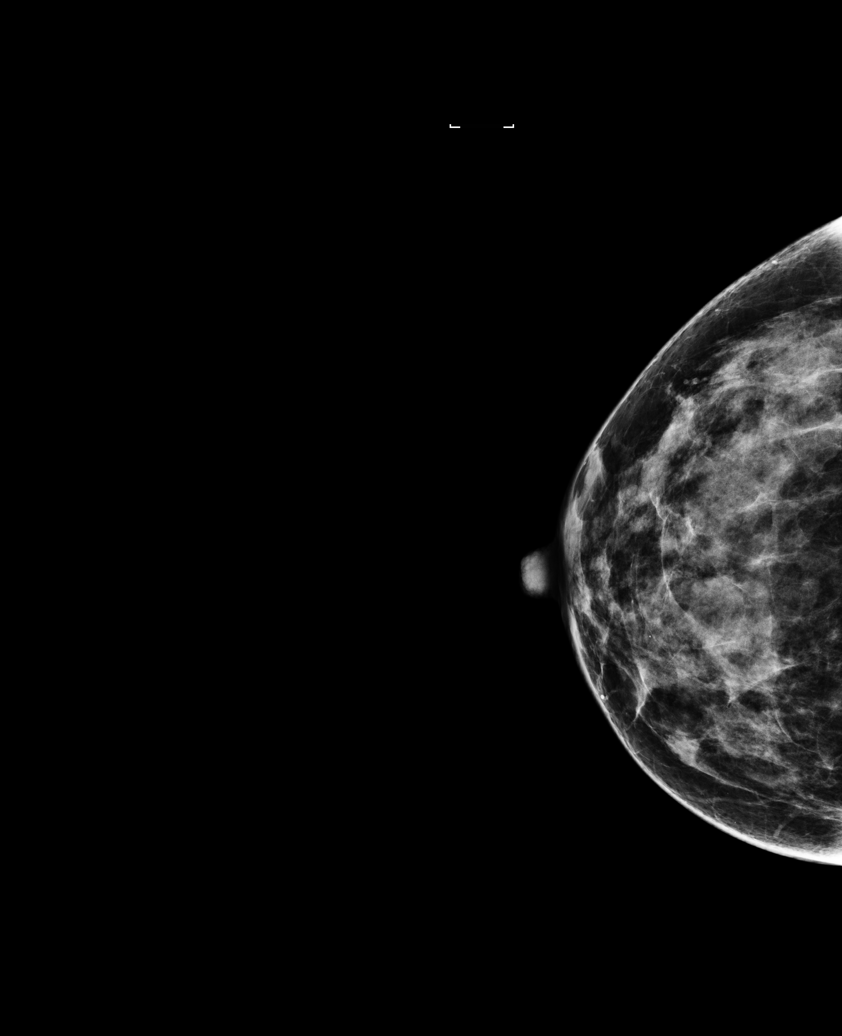

[6 of 6 positions shown; findings below may reference images not displayed]

ACR Breast Density Category c: The breast tissue is heterogeneously
dense, which may obscure small masses.
FINDINGS: There are no findings suspicious for malignancy. Images were
processed with CAD.
IMPRESSION: No mammographic evidence of malignancy. A result letter of this
screening mammogram will be mailed directly to the patient.

RECOMMENDATION:
Screening mammogram in one year. (Code:[0J])

BI-RADS CATEGORY  1: Negative.

## 2017-01-18 ENCOUNTER — Inpatient Hospital Stay
Admission: RE | Admit: 2017-01-18 | Discharge: 2017-01-18 | Disposition: A | Discharge status: Home / Self Care | Payer: Self-pay | Source: Ambulatory Visit | Attending: *Deleted | Admitting: *Deleted

## 2017-01-18 ENCOUNTER — Other Ambulatory Visit: Payer: Self-pay | Admitting: *Deleted

## 2017-01-18 DIAGNOSIS — Z9289 Personal history of other medical treatment: Secondary | ICD-10-CM

## 2017-02-07 DIAGNOSIS — Z6825 Body mass index (BMI) 25.0-25.9, adult: Secondary | ICD-10-CM | POA: Diagnosis not present

## 2017-02-07 DIAGNOSIS — J111 Influenza due to unidentified influenza virus with other respiratory manifestations: Secondary | ICD-10-CM | POA: Diagnosis not present

## 2017-04-23 DIAGNOSIS — K602 Anal fissure, unspecified: Secondary | ICD-10-CM | POA: Diagnosis not present

## 2017-04-23 DIAGNOSIS — Z720 Tobacco use: Secondary | ICD-10-CM | POA: Diagnosis not present

## 2017-04-23 DIAGNOSIS — D013 Carcinoma in situ of anus and anal canal: Secondary | ICD-10-CM | POA: Diagnosis not present

## 2017-07-05 DIAGNOSIS — D485 Neoplasm of uncertain behavior of skin: Secondary | ICD-10-CM | POA: Diagnosis not present

## 2017-07-05 DIAGNOSIS — L309 Dermatitis, unspecified: Secondary | ICD-10-CM | POA: Diagnosis not present

## 2017-07-05 DIAGNOSIS — D225 Melanocytic nevi of trunk: Secondary | ICD-10-CM | POA: Diagnosis not present

## 2017-07-18 ENCOUNTER — Other Ambulatory Visit: Payer: Self-pay | Admitting: Obstetrics and Gynecology

## 2017-07-18 NOTE — Telephone Encounter (Signed)
Please advise 

## 2017-07-19 ENCOUNTER — Other Ambulatory Visit: Payer: Self-pay | Admitting: Obstetrics and Gynecology

## 2017-08-22 ENCOUNTER — Telehealth: Payer: Self-pay

## 2017-08-22 ENCOUNTER — Other Ambulatory Visit: Payer: Self-pay | Admitting: Obstetrics and Gynecology

## 2017-08-22 ENCOUNTER — Other Ambulatory Visit: Payer: Self-pay

## 2017-08-22 MED ORDER — VALACYCLOVIR HCL 500 MG PO TABS: 500.0000 mg | ORAL_TABLET | Freq: Two times a day (BID) | ORAL | 0 refills | Status: DC

## 2017-08-22 NOTE — Telephone Encounter (Signed)
Pt calling for refill of valtrex.  Pharm hasn't gotten a response.  Pt aware refill eRx'd.

## 2017-09-04 DIAGNOSIS — D235 Other benign neoplasm of skin of trunk: Secondary | ICD-10-CM | POA: Diagnosis not present

## 2017-11-12 ENCOUNTER — Encounter: Payer: Self-pay | Admitting: Obstetrics and Gynecology

## 2017-11-12 ENCOUNTER — Ambulatory Visit (INDEPENDENT_AMBULATORY_CARE_PROVIDER_SITE_OTHER): Payer: BLUE CROSS/BLUE SHIELD | Admitting: Obstetrics and Gynecology

## 2017-11-12 VITALS — BP 122/70 | HR 86 | Ht 66.0 in | Wt 162.0 lb

## 2017-11-12 DIAGNOSIS — Z1329 Encounter for screening for other suspected endocrine disorder: Secondary | ICD-10-CM

## 2017-11-12 DIAGNOSIS — Z13220 Encounter for screening for lipoid disorders: Secondary | ICD-10-CM | POA: Diagnosis not present

## 2017-11-12 DIAGNOSIS — E663 Overweight: Secondary | ICD-10-CM | POA: Diagnosis not present

## 2017-11-12 DIAGNOSIS — Z6826 Body mass index (BMI) 26.0-26.9, adult: Secondary | ICD-10-CM

## 2017-11-12 DIAGNOSIS — Z131 Encounter for screening for diabetes mellitus: Secondary | ICD-10-CM | POA: Diagnosis not present

## 2017-11-12 DIAGNOSIS — Z1231 Encounter for screening mammogram for malignant neoplasm of breast: Secondary | ICD-10-CM | POA: Diagnosis not present

## 2017-11-12 DIAGNOSIS — Z01419 Encounter for gynecological examination (general) (routine) without abnormal findings: Secondary | ICD-10-CM

## 2017-11-12 DIAGNOSIS — Z1239 Encounter for other screening for malignant neoplasm of breast: Secondary | ICD-10-CM

## 2017-11-12 NOTE — Progress Notes (Signed)
Gynecology Annual Exam  PCP: Caryl Bis, MD  Chief Complaint:  Chief Complaint  Patient presents with  . Gynecologic Exam    History of Present Illness: Patient is a 46 y.o. G3P3 presents for annual exam. The patient has no complaints today.   LMP: Patient's last menstrual period was 03/13/2012. Status post hysterectomy 2015 ([facility])  The patient is sexually active. She currently uses status post hysterectomy for contraception. She denies dyspareunia.  The patient does perform self breast exams.  There is no notable family history of breast or ovarian cancer in her family.  The patient wears seatbelts: yes.   The patient has regular exercise: not asked.    The patient denies current symptoms of depression.    Review of Systems: Review of Systems  Constitutional: Negative for chills and fever.  HENT: Negative for congestion.   Respiratory: Negative for cough and shortness of breath.   Cardiovascular: Negative for chest pain and palpitations.  Gastrointestinal: Negative for abdominal pain, constipation, diarrhea, heartburn, nausea and vomiting.  Genitourinary: Negative for dysuria, frequency and urgency.  Skin: Negative for itching and rash.  Neurological: Negative for dizziness and headaches.  Endo/Heme/Allergies: Negative for polydipsia.  Psychiatric/Behavioral: Negative for depression.    Past Medical History:  Past Medical History:  Diagnosis Date  . Constipation, chronic 08/08/2016  . Headache    migraines     Past Surgical History:  Past Surgical History:  Procedure Laterality Date  . ABDOMINAL HYSTERECTOMY    . COLONOSCOPY    . SPHINCTEROTOMY N/A 08/30/2016   Procedure: LATERAL INTERNAL AND SPHINCTEROTOMY;  Surgeon: Michael Boston, MD;  Location: [facility];  Service: General;  Laterality: N/A;  . TONSILLECTOMY      Gynecologic History:  Patient's last menstrual period was 03/13/2012. Contraception: status post hysterectomy Last Pap: Results were: N/A Last  mammogram: 01/11/2017 Results were: BI-RAD I   Obstetric History: G3P3  Family History:  Family History  Problem Relation Age of Onset  . Breast cancer Paternal Aunt     Social History:  Social History   Socioeconomic History  . Marital status: Married    Spouse name: Not on file  . Number of children: Not on file  . Years of education: Not on file  . Highest education level: Not on file  Social Needs  . Financial resource strain: Not on file  . Food insecurity - worry: Not on file  . Food insecurity - inability: Not on file  . Transportation needs - medical: Not on file  . Transportation needs - non-medical: Not on file  Occupational History  . Not on file  Tobacco Use  . Smoking status: Current Every Day Smoker    Packs/day: 1.00    Years: 10.00    Pack years: 10.00    Types: Cigarettes    Last attempt to quit: 03/18/2012    Years since quitting: 5.6  . Smokeless tobacco: Never Used  Substance and Sexual Activity  . Alcohol use: Yes    Comment: occ wine   . Drug use: No  . Sexual activity: Yes    Birth control/protection: Surgical    Comment: Hysterectomy  Other Topics Concern  . Not on file  Social History Narrative  . Not on file    Allergies:  Allergies  Allergen Reactions  . Sulfa Drugs Cross Reactors Itching and Rash    Medications: Prior to Admission medications   Medication Sig Start Date End Date Taking? Authorizing Provider  cetirizine (ZYRTEC)  10 MG tablet Take 10 mg by mouth daily as needed for allergies.   Yes [provider]  Multiple Vitamin (MULTIVITAMIN WITH MINERALS) TABS tablet Take 1 tablet by mouth daily.   Yes [provider]  naproxen (NAPROSYN) 500 MG tablet Take 1 tablet (500 mg total) by mouth 2 (two) times daily with a meal. 08/30/16  Yes Gross, Remo Lipps, MD  polyethylene glycol (MIRALAX / GLYCOLAX) packet Take 17 g by mouth 2 (two) times daily.   Yes [provider]  valACYclovir (VALTREX) 500 MG tablet  Take 1 tablet (500 mg total) by mouth 2 (two) times daily. 08/22/17  Yes [name], MD    Physical Exam Vitals: Blood pressure 122/70, pulse 86, height 5\' 6"  (1.676 m), weight 162 lb (73.5 kg), last menstrual period 03/13/2012. Body mass index is 26.15 kg/m.  General: NAD HEENT: normocephalic, anicteric Thyroid: no enlargement, no palpable nodules Pulmonary: No increased work of breathing, CTAB Cardiovascular: RRR, distal pulses 2+ Breast: Breast symmetrical, no tenderness, no palpable nodules or masses, no skin or nipple retraction present, no nipple discharge.  No axillary or supraclavicular lymphadenopathy. Abdomen: NABS, soft, non-tender, non-distended.  Umbilicus without lesions.  No hepatomegaly, splenomegaly or masses palpable. No evidence of hernia  Genitourinary:  External: Normal external female genitalia.  Normal urethral meatus, normal  Bartholin's and Skene's glands.    Vagina: Normal vaginal mucosa, no evidence of prolapse.    Cervix: surgically absent  Uterus: surgically absent  Adnexa: ovaries non-enlarged, no adnexal masses  Rectal: deferred  Lymphatic: no evidence of inguinal lymphadenopathy Extremities: no edema, erythema, or tenderness Neurologic: Grossly intact Psychiatric: mood appropriate, affect full  Female chaperone present for pelvic and breast  portions of the physical exam    Assessment: 46 y.o. G3P3 routine annual exam  Plan: Problem List Items Addressed This Visit    None    Visit Diagnoses    Screening for thyroid disorder    -  Primary   Relevant Orders   CMP14+LP+TP+TSH+CBC/Plt   Breast screening       Relevant Orders   MM DIGITAL SCREENING BILATERAL   Encounter for gynecological examination without abnormal finding       Relevant Orders   MM DIGITAL SCREENING BILATERAL   Screening for diabetes mellitus       Relevant Orders   CMP14+LP+TP+TSH+CBC/Plt   Screening cholesterol level       Relevant Orders    CMP14+LP+TP+TSH+CBC/Plt   Overweight (BMI 25.0-29.9)       Relevant Orders   CMP14+LP+TP+TSH+CBC/Plt   BMI 26.0-26.9,adult       Relevant Orders   CMP14+LP+TP+TSH+CBC/Plt      1) Mammogram - recommend yearly screening mammogram.  Mammogram Is up to date   2) STI screening was not offered  3) ASCCP guidelines and rational discussed.  Patient opts for Discontinue screening interval  4) Contraception - continue norethindrone for pelvic pain and menorrhagia  5) Colonoscopy -- Screening recommended starting at age 60 for average risk individuals, age 56 for individuals deemed at increased risk (including African Americans) and recommended to continue until age 63.  For patient age 35-85 individualized approach is recommended.  Gold standard screening is via colonoscopy, Cologuard screening is an acceptable alternative for patient unwilling or unable to undergo colonoscopy.  "Colorectal cancer screening for average?risk adults: 2018 guideline update from the American Cancer Society"CA: A Cancer Journal for Clinicians: May 16, 2017   6) Routine healthcare maintenance including cholesterol, diabetes screening discussed Ordered today  7) Return in 1 year (on 11/12/2018).

## 2017-11-12 NOTE — Patient Instructions (Signed)
Preventive Care 40-64 Years, Female Preventive care refers to lifestyle choices and visits with your health care provider that can promote health and wellness. What does preventive care include?  A yearly physical exam. This is also called an annual well check.  Dental exams once or twice a year.  Routine eye exams. Ask your health care provider how often you should have your eyes checked.  Personal lifestyle choices, including: ? Daily care of your teeth and gums. ? Regular physical activity. ? Eating a healthy diet. ? Avoiding tobacco and drug use. ? Limiting alcohol use. ? Practicing safe sex. ? Taking low-dose aspirin daily starting at age 58. ? Taking vitamin and mineral supplements as recommended by your health care provider. What happens during an annual well check? The services and screenings done by your health care provider during your annual well check will depend on your age, overall health, lifestyle risk factors, and family history of disease. Counseling Your health care provider may ask you questions about your:  Alcohol use.  Tobacco use.  Drug use.  Emotional well-being.  Home and relationship well-being.  Sexual activity.  Eating habits.  Work and work Statistician.  Method of birth control.  Menstrual cycle.  Pregnancy history.  Screening You may have the following tests or measurements:  Height, weight, and BMI.  Blood pressure.  Lipid and cholesterol levels. These may be checked every 5 years, or more frequently if you are over 81 years old.  Skin check.  Lung cancer screening. You may have this screening every year starting at age 78 if you have a 30-pack-year history of smoking and currently smoke or have quit within the past 15 years.  Fecal occult blood test (FOBT) of the stool. You may have this test every year starting at age 65.  Flexible sigmoidoscopy or colonoscopy. You may have a sigmoidoscopy every 5 years or a colonoscopy  every 10 years starting at age 30.  Hepatitis C blood test.  Hepatitis B blood test.  Sexually transmitted disease (STD) testing.  Diabetes screening. This is done by checking your blood sugar (glucose) after you have not eaten for a while (fasting). You may have this done every 1-3 years.  Mammogram. This may be done every 1-2 years. Talk to your health care provider about when you should start having regular mammograms. This may depend on whether you have a family history of breast cancer.  BRCA-related cancer screening. This may be done if you have a family history of breast, ovarian, tubal, or peritoneal cancers.  Pelvic exam and Pap test. This may be done every 3 years starting at age 80. Starting at age 36, this may be done every 5 years if you have a Pap test in combination with an HPV test.  Bone density scan. This is done to screen for osteoporosis. You may have this scan if you are at high risk for osteoporosis.  Discuss your test results, treatment options, and if necessary, the need for more tests with your health care provider. Vaccines Your health care provider may recommend certain vaccines, such as:  Influenza vaccine. This is recommended every year.  Tetanus, diphtheria, and acellular pertussis (Tdap, Td) vaccine. You may need a Td booster every 10 years.  Varicella vaccine. You may need this if you have not been vaccinated.  Zoster vaccine. You may need this after age 5.  Measles, mumps, and rubella (MMR) vaccine. You may need at least one dose of MMR if you were born in  1958 or later. You may also need a second dose.  Pneumococcal 13-valent conjugate (PCV13) vaccine. You may need this if you have certain conditions and were not previously vaccinated.  Pneumococcal polysaccharide (PPSV23) vaccine. You may need one or two doses if you smoke cigarettes or if you have certain conditions.  Meningococcal vaccine. You may need this if you have certain  conditions.  Hepatitis A vaccine. You may need this if you have certain conditions or if you travel or work in places where you may be exposed to hepatitis A.  Hepatitis B vaccine. You may need this if you have certain conditions or if you travel or work in places where you may be exposed to hepatitis B.  Haemophilus influenzae type b (Hib) vaccine. You may need this if you have certain conditions.  Talk to your health care provider about which screenings and vaccines you need and how often you need them. This information is not intended to replace advice given to you by your health care provider. Make sure you discuss any questions you have with your health care provider. Document Released: 12/31/2015 Document Revised: 08/23/2016 Document Reviewed: 10/05/2015 Elsevier Interactive Patient Education  2017 [employer].

## 2017-11-13 LAB — CMP14+LP+TP+TSH+CBC/PLT
ALBUMIN: 4.3 g/dL (ref 3.5–5.5)
ALT: 67 IU/L — ABNORMAL HIGH (ref 0–32)
AST: 36 IU/L (ref 0–40)
Albumin/Globulin Ratio: 1.9 (ref 1.2–2.2)
Alkaline Phosphatase: 60 IU/L (ref 39–117)
BUN / CREAT RATIO: 15 (ref 9–23)
BUN: 10 mg/dL (ref 6–24)
Bilirubin Total: 0.3 mg/dL (ref 0.0–1.2)
CHLORIDE: 104 mmol/L (ref 96–106)
CHOLESTEROL TOTAL: 178 mg/dL (ref 100–199)
CO2: 22 mmol/L (ref 20–29)
CREATININE: 0.68 mg/dL (ref 0.57–1.00)
Calcium: 8.7 mg/dL (ref 8.7–10.2)
Free Thyroxine Index: 1.8 (ref 1.2–4.9)
GFR calc non Af Amer: 105 mL/min/{1.73_m2} (ref >59)
GFR, EST AFRICAN AMERICAN: 120 mL/min/{1.73_m2} (ref >59)
GLUCOSE: 98 mg/dL (ref 65–99)
Globulin, Total: 2.3 g/dL (ref 1.5–4.5)
HDL: 39 mg/dL — AB (ref >39)
HEMATOCRIT: 41.5 % (ref 34.0–46.6)
HEMOGLOBIN: 14.1 g/dL (ref 11.1–15.9)
LDL Calculated: 119 mg/dL — ABNORMAL HIGH (ref 0–99)
LDl/HDL Ratio: 3.1 ratio (ref 0.0–3.2)
MCH: 33.2 pg — ABNORMAL HIGH (ref 26.6–33.0)
MCHC: 34 g/dL (ref 31.5–35.7)
MCV: 98 fL — ABNORMAL HIGH (ref 79–97)
POTASSIUM: 4.3 mmol/L (ref 3.5–5.2)
Platelets: 206 10*3/uL (ref 150–379)
RBC: 4.25 x10E6/uL (ref 3.77–5.28)
RDW: 13.4 % (ref 12.3–15.4)
Sodium: 141 mmol/L (ref 134–144)
T3 Uptake Ratio: 27 % (ref 24–39)
T4, Total: 6.8 ug/dL (ref 4.5–12.0)
TOTAL PROTEIN: 6.6 g/dL (ref 6.0–8.5)
TSH: 1.82 u[IU]/mL (ref 0.450–4.500)
Triglycerides: 100 mg/dL (ref 0–149)
VLDL CHOLESTEROL CAL: 20 mg/dL (ref 5–40)
WBC: 7.2 10*3/uL (ref 3.4–10.8)

## 2017-11-19 ENCOUNTER — Encounter: Payer: Self-pay | Admitting: Obstetrics and Gynecology

## 2017-11-20 ENCOUNTER — Other Ambulatory Visit: Payer: Self-pay | Admitting: Obstetrics and Gynecology

## 2017-11-20 DIAGNOSIS — R7401 Elevation of levels of liver transaminase levels: Secondary | ICD-10-CM

## 2017-11-20 DIAGNOSIS — R740 Nonspecific elevation of levels of transaminase and lactic acid dehydrogenase [LDH]: Principal | ICD-10-CM

## 2018-01-02 DIAGNOSIS — L710 Perioral dermatitis: Secondary | ICD-10-CM | POA: Diagnosis not present

## 2018-01-02 DIAGNOSIS — L280 Lichen simplex chronicus: Secondary | ICD-10-CM | POA: Diagnosis not present

## 2018-01-02 DIAGNOSIS — L309 Dermatitis, unspecified: Secondary | ICD-10-CM | POA: Diagnosis not present

## 2018-01-15 ENCOUNTER — Ambulatory Visit
Admission: RE | Admit: 2018-01-15 | Discharge: 2018-01-15 | Disposition: A | Discharge status: Home / Self Care | Payer: BLUE CROSS/BLUE SHIELD | Source: Ambulatory Visit | Attending: Obstetrics and Gynecology | Admitting: Obstetrics and Gynecology

## 2018-01-15 ENCOUNTER — Encounter: Payer: Self-pay | Admitting: Obstetrics and Gynecology

## 2018-01-15 DIAGNOSIS — Z1239 Encounter for other screening for malignant neoplasm of breast: Secondary | ICD-10-CM

## 2018-01-15 DIAGNOSIS — Z1231 Encounter for screening mammogram for malignant neoplasm of breast: Secondary | ICD-10-CM | POA: Insufficient documentation

## 2018-01-15 DIAGNOSIS — Z01419 Encounter for gynecological examination (general) (routine) without abnormal findings: Secondary | ICD-10-CM

## 2018-01-15 IMAGING — MG MM DIGITAL SCREENING BILAT W/ CAD
4 series · 4 of 4 positions shown · non-contrast
Comparison: With priors

CLINICAL DATA: Screening.

EXAM:
DIGITAL SCREENING BILATERAL MAMMOGRAM WITH CAD

[L MLO]
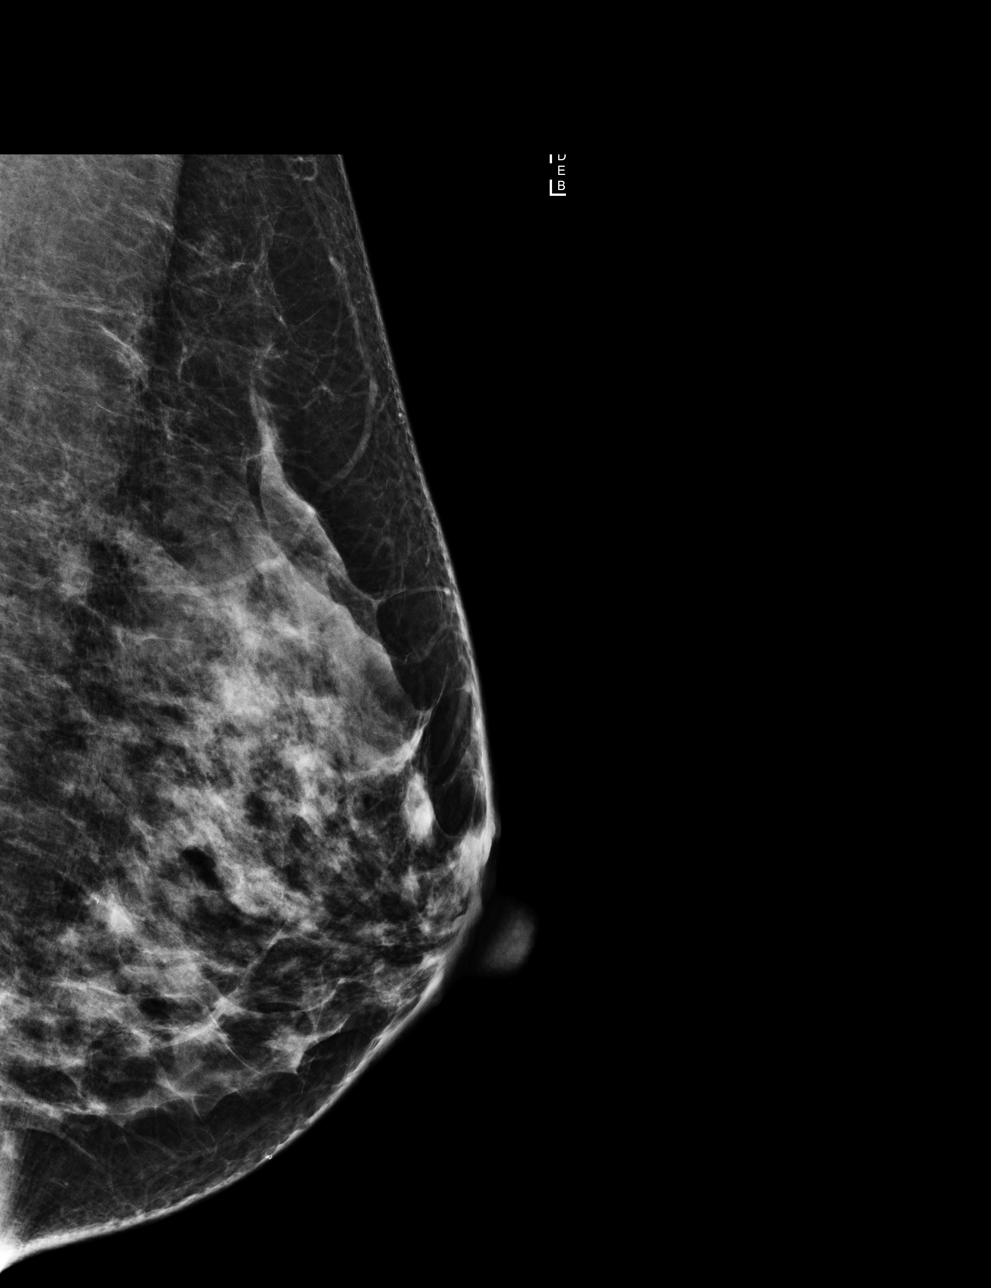

[R CC]
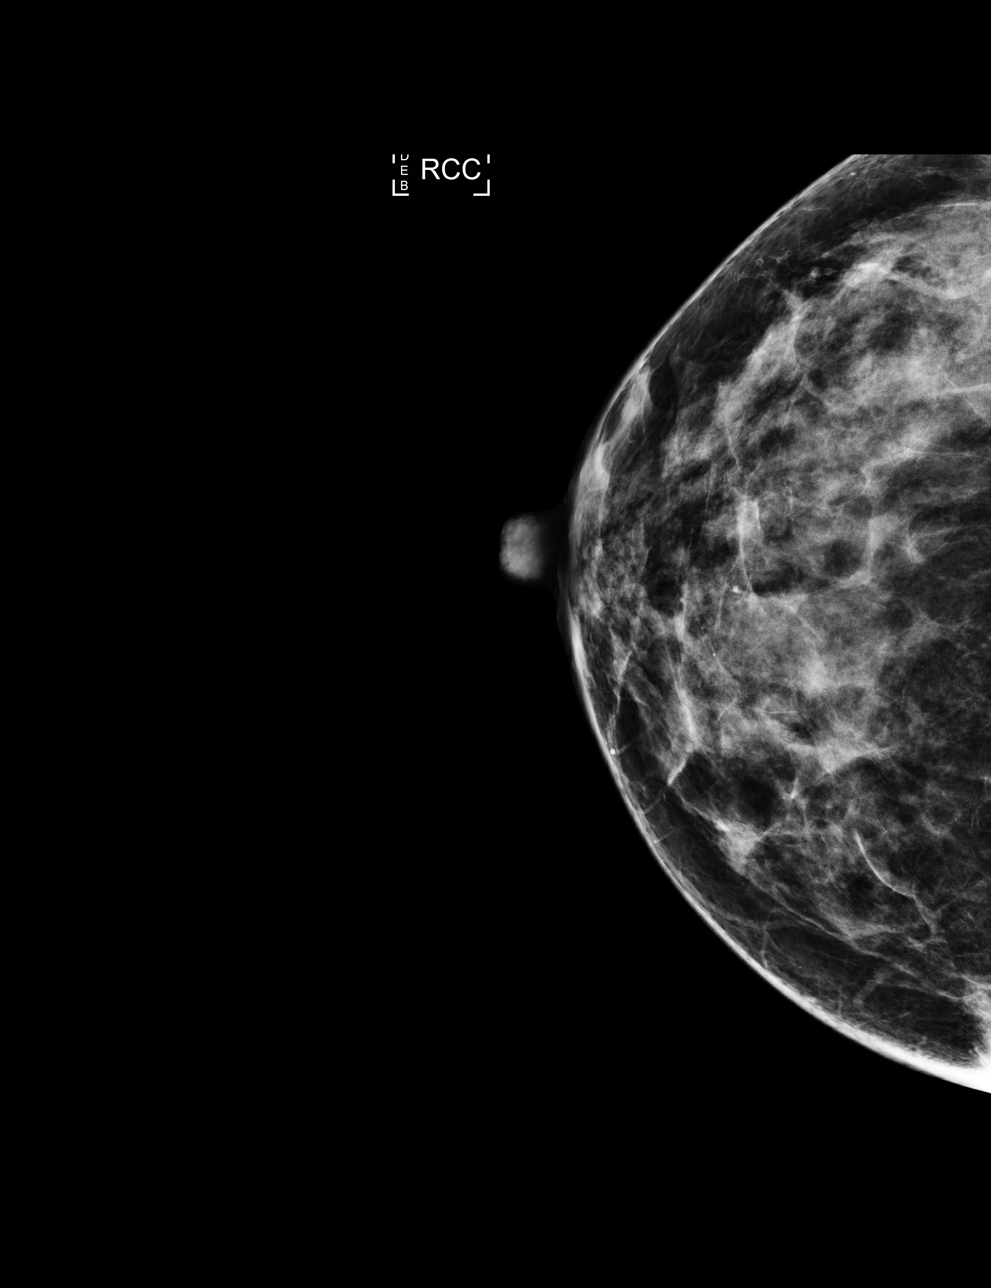

[L CC]
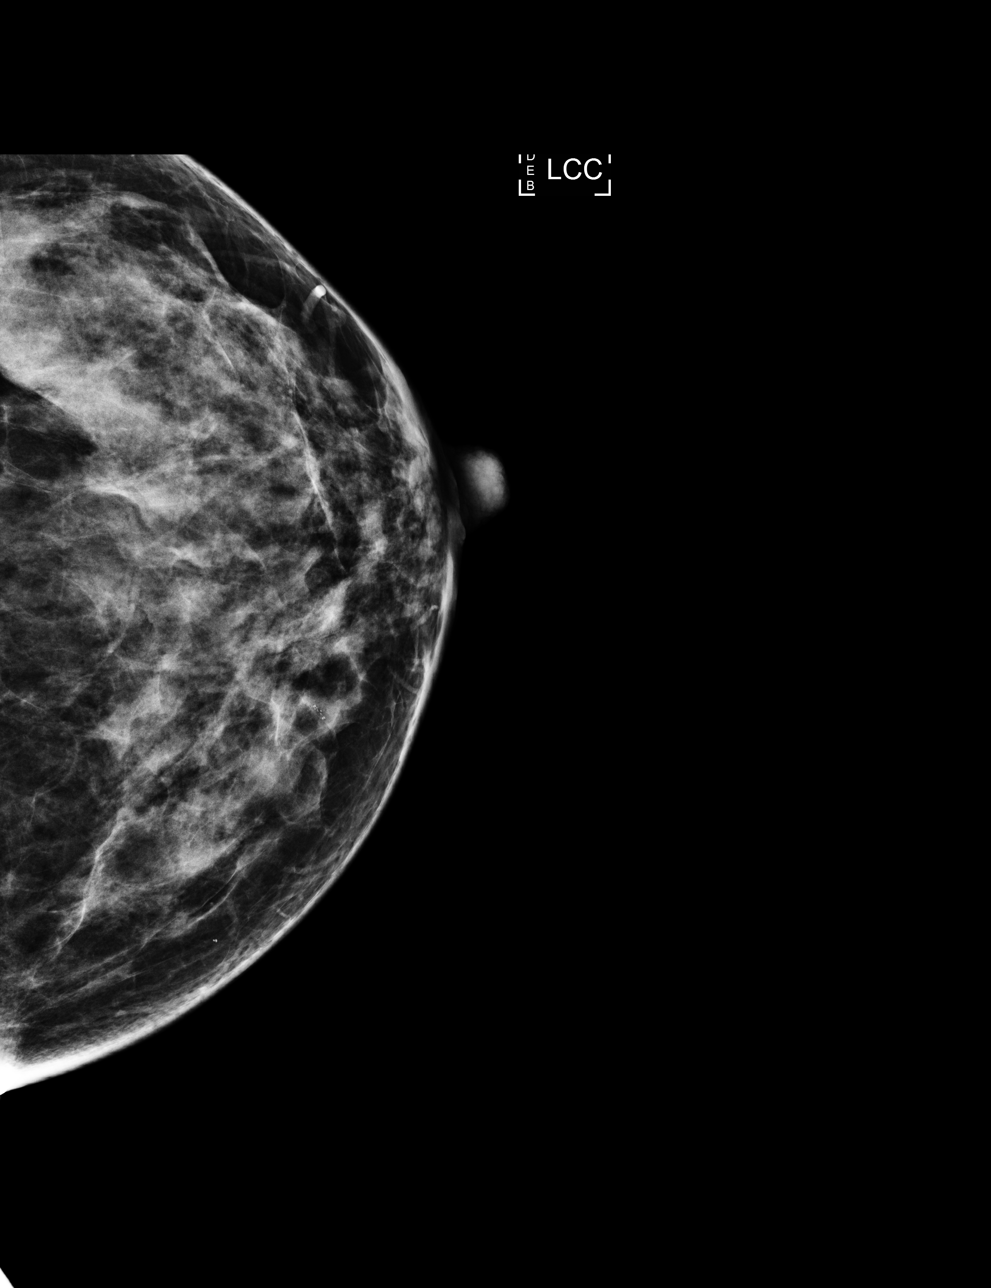

[R MLO]
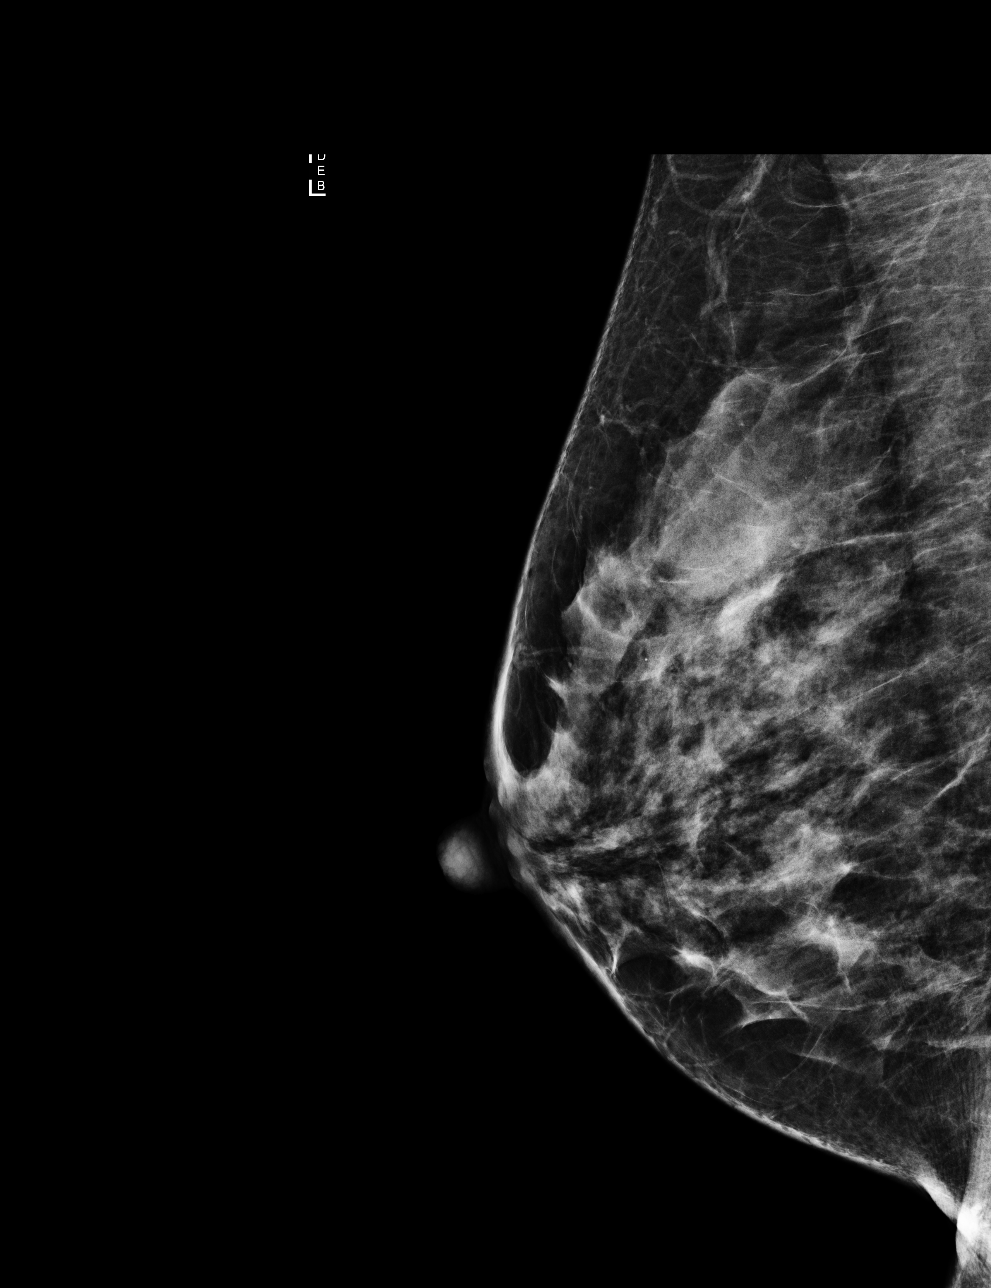

[4 of 4 positions shown; findings below may reference images not displayed]

ACR Breast Density Category c: The breast tissue is heterogeneously
dense, which may obscure small masses
FINDINGS: There are no findings suspicious for malignancy. Images were
processed with CAD.
IMPRESSION: No mammographic evidence of malignancy. A result letter of this
screening mammogram will be mailed directly to the patient.

RECOMMENDATION:
Screening mammogram in one year. (Code:[34])

BI-RADS CATEGORY  1: Negative.

## 2018-03-06 DIAGNOSIS — L280 Lichen simplex chronicus: Secondary | ICD-10-CM | POA: Diagnosis not present

## 2018-03-06 DIAGNOSIS — L700 Acne vulgaris: Secondary | ICD-10-CM | POA: Diagnosis not present

## 2018-03-06 DIAGNOSIS — L570 Actinic keratosis: Secondary | ICD-10-CM | POA: Diagnosis not present

## 2018-07-16 DIAGNOSIS — L709 Acne, unspecified: Secondary | ICD-10-CM | POA: Diagnosis not present

## 2018-07-16 DIAGNOSIS — L309 Dermatitis, unspecified: Secondary | ICD-10-CM | POA: Diagnosis not present

## 2018-08-21 DIAGNOSIS — Z6826 Body mass index (BMI) 26.0-26.9, adult: Secondary | ICD-10-CM | POA: Diagnosis not present

## 2018-08-21 DIAGNOSIS — F17210 Nicotine dependence, cigarettes, uncomplicated: Secondary | ICD-10-CM | POA: Diagnosis not present

## 2018-08-21 DIAGNOSIS — J309 Allergic rhinitis, unspecified: Secondary | ICD-10-CM | POA: Diagnosis not present

## 2018-08-21 DIAGNOSIS — R0683 Snoring: Secondary | ICD-10-CM | POA: Diagnosis not present

## 2018-10-07 ENCOUNTER — Encounter (HOSPITAL_COMMUNITY): Payer: Self-pay | Admitting: Emergency Medicine

## 2018-10-07 ENCOUNTER — Other Ambulatory Visit: Payer: Self-pay

## 2018-10-07 ENCOUNTER — Emergency Department (HOSPITAL_COMMUNITY)
Admission: EM | Admit: 2018-10-07 | Discharge: 2018-10-07 | Disposition: A | Discharge status: Home / Self Care | Payer: BLUE CROSS/BLUE SHIELD | Attending: Emergency Medicine | Admitting: Emergency Medicine

## 2018-10-07 ENCOUNTER — Emergency Department (HOSPITAL_COMMUNITY): Payer: BLUE CROSS/BLUE SHIELD

## 2018-10-07 DIAGNOSIS — F17210 Nicotine dependence, cigarettes, uncomplicated: Secondary | ICD-10-CM | POA: Diagnosis not present

## 2018-10-07 DIAGNOSIS — R0789 Other chest pain: Secondary | ICD-10-CM | POA: Diagnosis not present

## 2018-10-07 DIAGNOSIS — R079 Chest pain, unspecified: Secondary | ICD-10-CM | POA: Diagnosis not present

## 2018-10-07 LAB — CBC
HCT: 45.4 % (ref 36.0–46.0)
HEMOGLOBIN: 15.1 g/dL — AB (ref 12.0–15.0)
MCH: 34.2 pg — AB (ref 26.0–34.0)
MCHC: 33.3 g/dL (ref 30.0–36.0)
MCV: 102.9 fL — AB (ref 80.0–100.0)
Platelets: 168 10*3/uL (ref 150–400)
RBC: 4.41 MIL/uL (ref 3.87–5.11)
RDW: 12.2 % (ref 11.5–15.5)
WBC: 6.7 10*3/uL (ref 4.0–10.5)
nRBC: 0 % (ref 0.0–0.2)

## 2018-10-07 LAB — DIFFERENTIAL
ABS IMMATURE GRANULOCYTES: 0.02 10*3/uL (ref 0.00–0.07)
Basophils Absolute: 0 10*3/uL (ref 0.0–0.1)
Basophils Relative: 0 %
EOS PCT: 4 %
Eosinophils Absolute: 0.3 10*3/uL (ref 0.0–0.5)
IMMATURE GRANULOCYTES: 0 %
LYMPHS ABS: 1.5 10*3/uL (ref 0.7–4.0)
LYMPHS PCT: 23 %
Monocytes Absolute: 0.6 10*3/uL (ref 0.1–1.0)
Monocytes Relative: 8 %
NEUTROS ABS: 4.3 10*3/uL (ref 1.7–7.7)
Neutrophils Relative %: 65 %

## 2018-10-07 LAB — LIPASE, BLOOD: Lipase: 38 U/L (ref 11–51)

## 2018-10-07 LAB — BASIC METABOLIC PANEL
ANION GAP: 9 (ref 5–15)
BUN: 11 mg/dL (ref 6–20)
CALCIUM: 9.1 mg/dL (ref 8.9–10.3)
CHLORIDE: 106 mmol/L (ref 98–111)
CO2: 23 mmol/L (ref 22–32)
Creatinine, Ser: 0.65 mg/dL (ref 0.44–1.00)
GLUCOSE: 120 mg/dL — AB (ref 70–99)
POTASSIUM: 3.9 mmol/L (ref 3.5–5.1)
Sodium: 138 mmol/L (ref 135–145)

## 2018-10-07 LAB — HEPATIC FUNCTION PANEL
ALBUMIN: 4.4 g/dL (ref 3.5–5.0)
ALK PHOS: 56 U/L (ref 38–126)
ALT: 149 U/L — AB (ref 0–44)
AST: 84 U/L — AB (ref 15–41)
BILIRUBIN TOTAL: 0.7 mg/dL (ref 0.3–1.2)
Bilirubin, Direct: 0.1 mg/dL (ref 0.0–0.2)
Indirect Bilirubin: 0.6 mg/dL (ref 0.3–0.9)
Total Protein: 7.6 g/dL (ref 6.5–8.1)

## 2018-10-07 LAB — I-STAT TROPONIN, ED
TROPONIN I, POC: 0 ng/mL (ref 0.00–0.08)
Troponin i, poc: 0 ng/mL (ref 0.00–0.08)

## 2018-10-07 IMAGING — DX DG CHEST 2V
2 series · 2 of 2 positions shown · non-contrast
Comparison: None.

CLINICAL DATA: Chest pain, nausea and weakness.

EXAM:
CHEST - 2 VIEW

[chest pa]
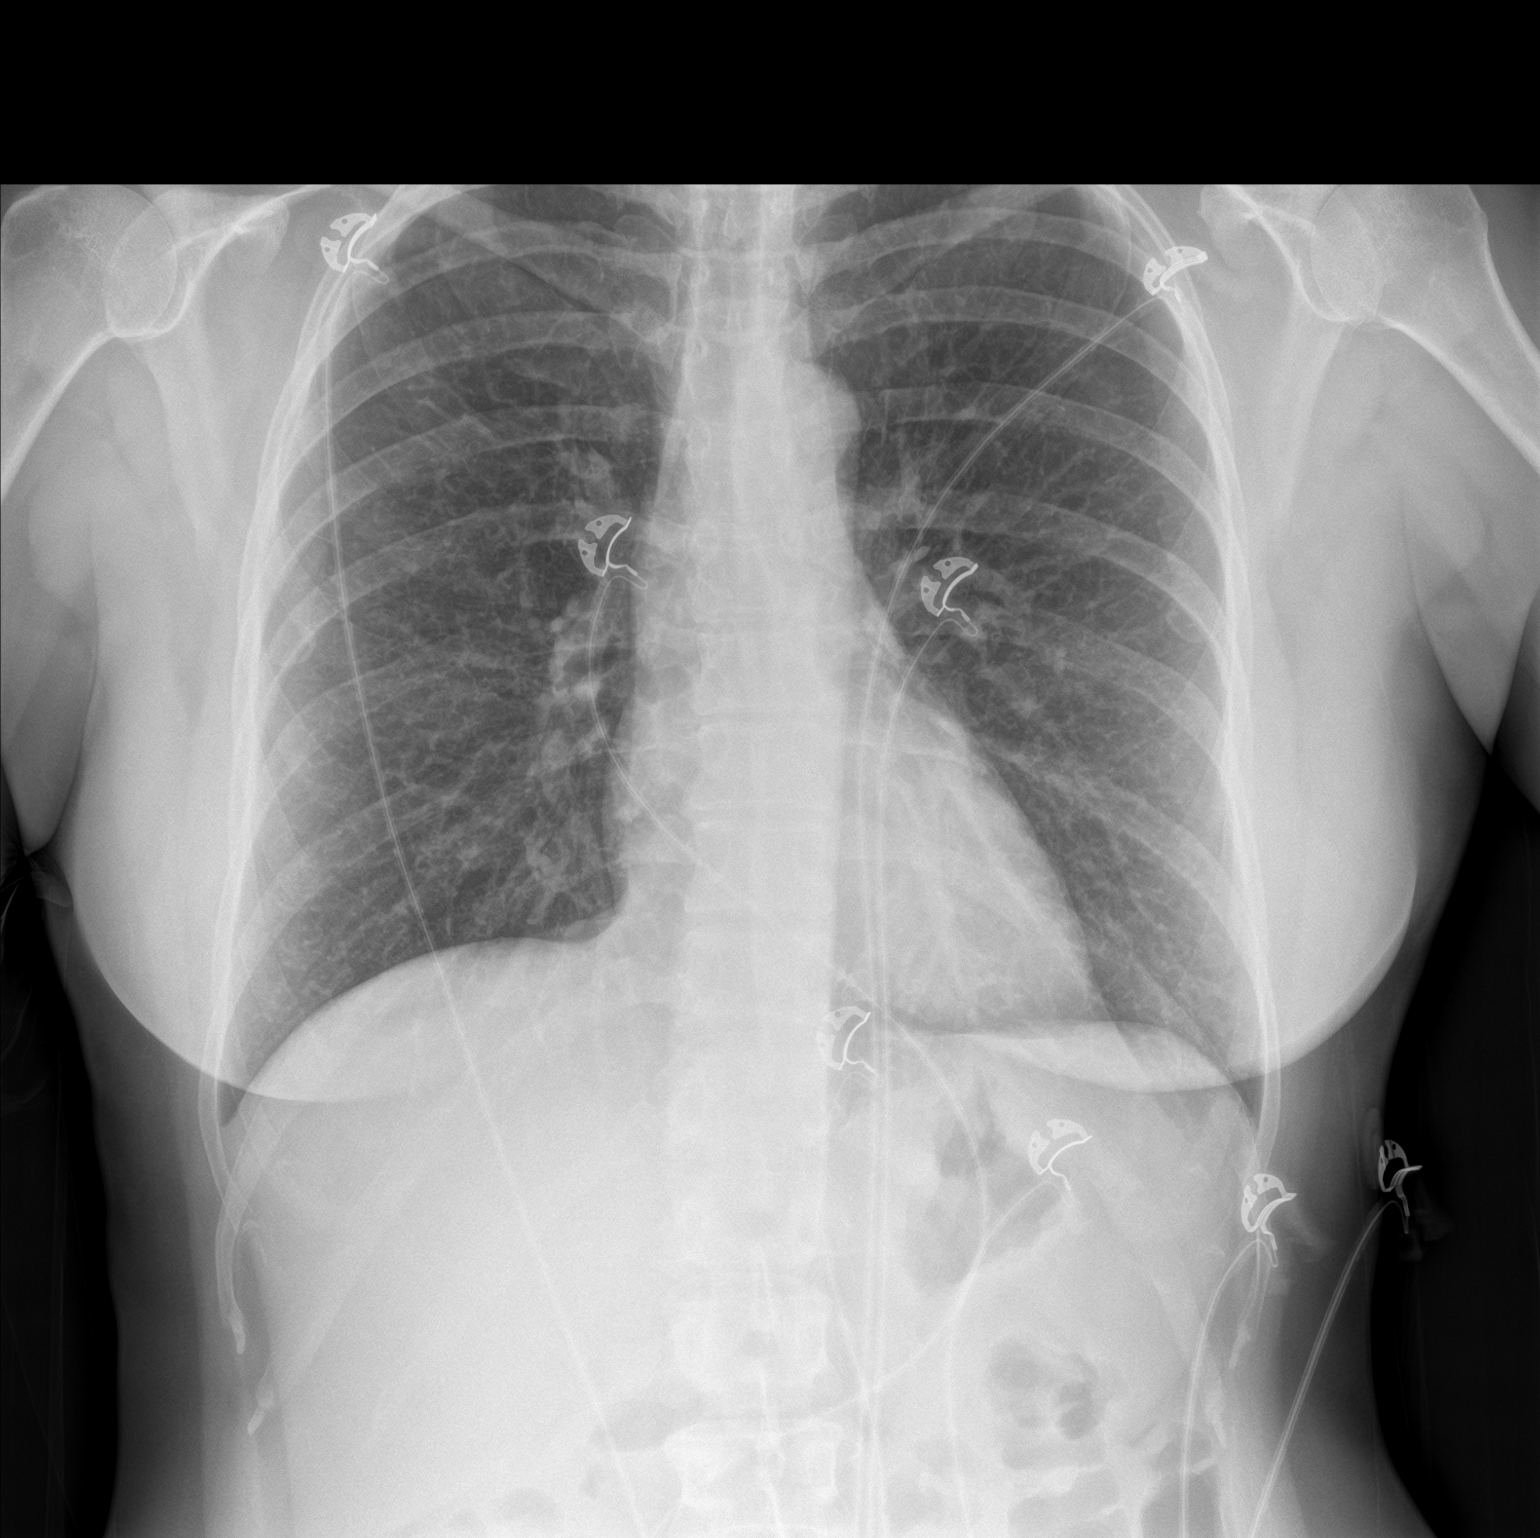

[chest lat]
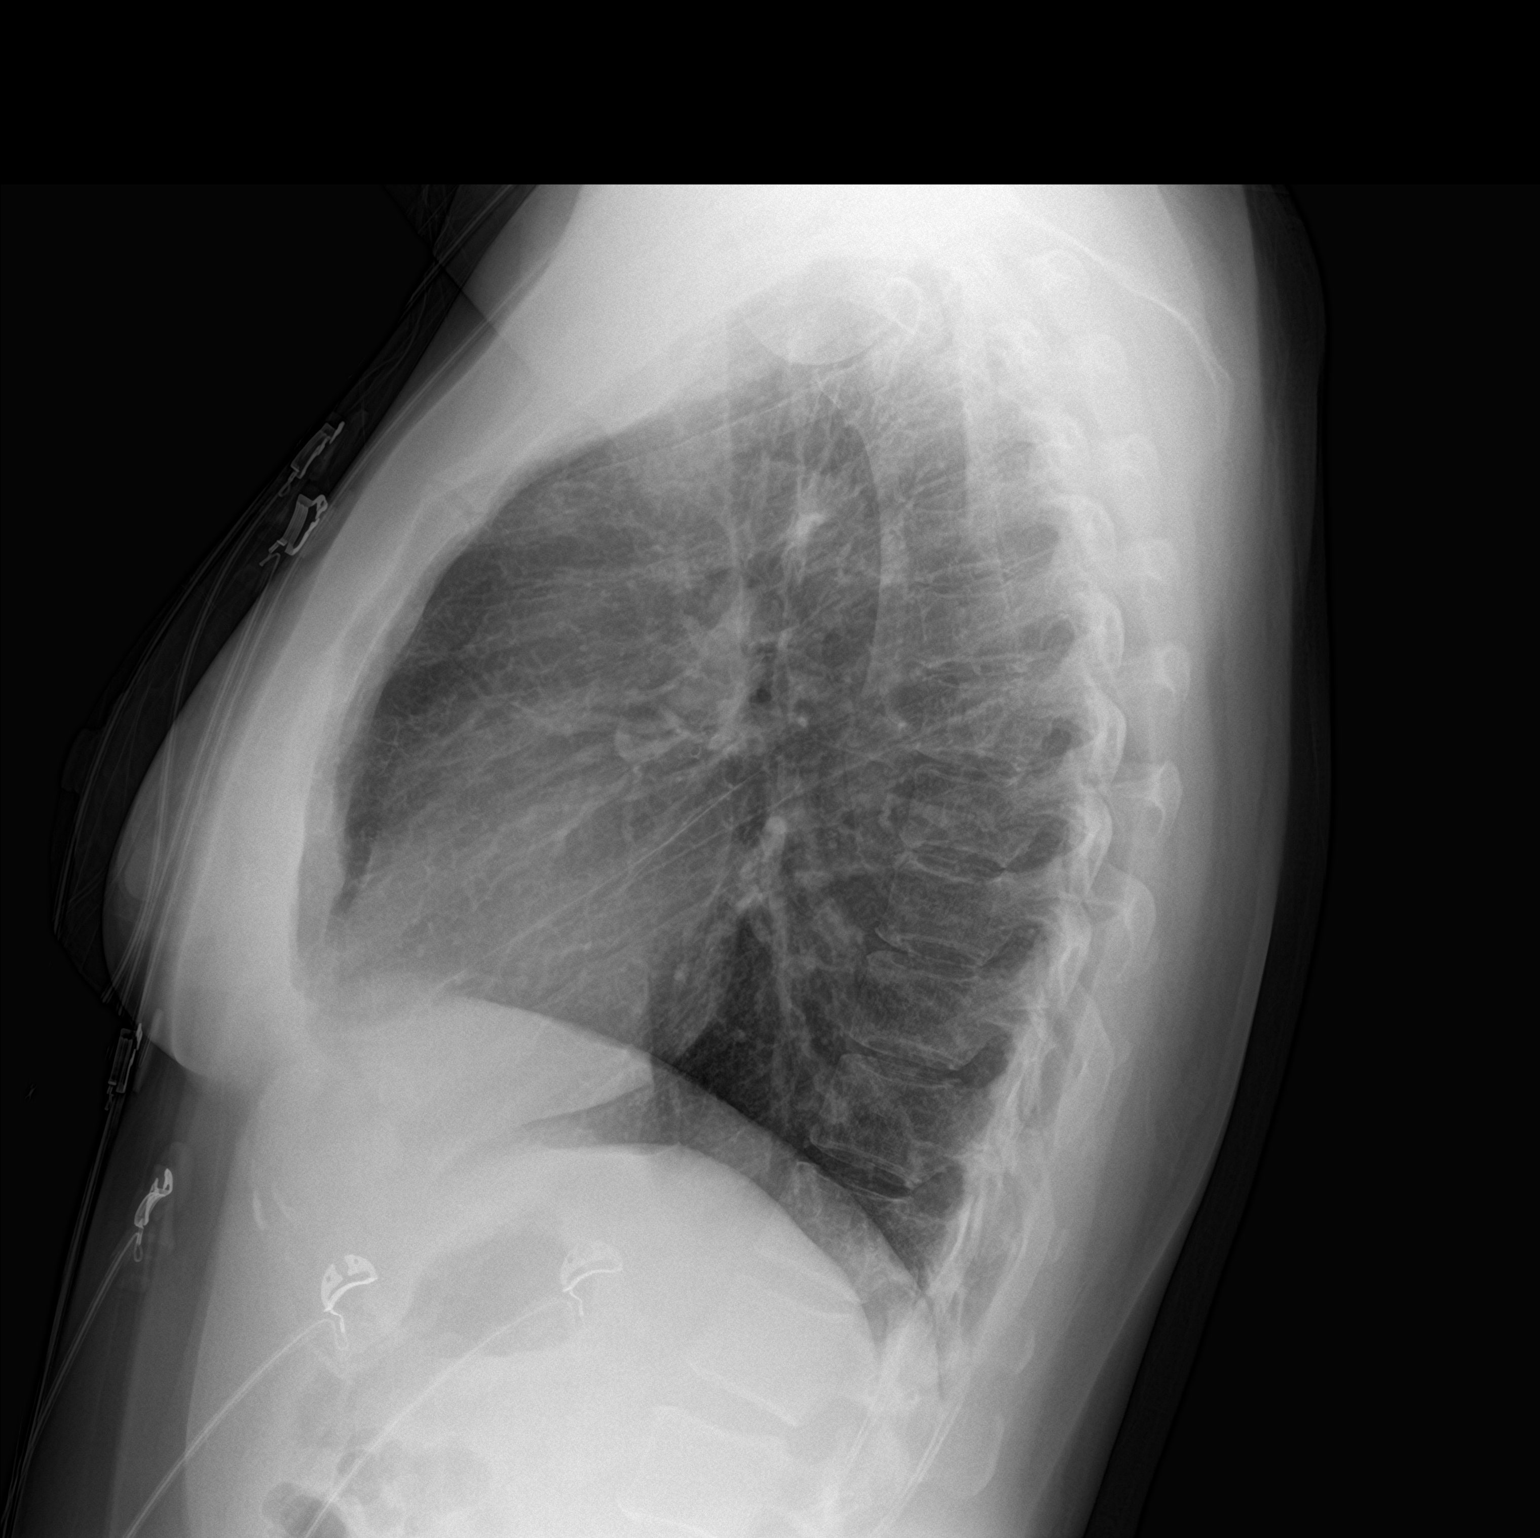

[2 of 2 positions shown; findings below may reference images not displayed]

FINDINGS: The heart size and mediastinal contours are within normal limits.
Mild diffuse bronchial and interstitial prominence is likely chronic
based on appearance. There is no evidence of pulmonary edema,
consolidation, pneumothorax, nodule or pleural fluid. The visualized
skeletal structures are unremarkable.
IMPRESSION: No acute findings. Likely chronic bronchial and interstitial
prominence, potentially related to long-term smoking.

## 2018-10-07 MED ORDER — CYCLOBENZAPRINE HCL 10 MG PO TABS: 10.0000 mg | ORAL_TABLET | Freq: Once | ORAL | Status: DC

## 2018-10-07 MED ORDER — HYDROCODONE-ACETAMINOPHEN 5-325 MG PO TABS: 1.0000 | ORAL_TABLET | Freq: Once | ORAL | Status: DC

## 2018-10-07 MED ORDER — ALUM & MAG HYDROXIDE-SIMETH 200-200-20 MG/5ML PO SUSP: 30.0000 mL | Freq: Once | ORAL | Status: DC

## 2018-10-07 MED ORDER — LIDOCAINE VISCOUS HCL 2 % MT SOLN: 15.0000 mL | Freq: Once | OROMUCOSAL | Status: DC

## 2018-10-07 NOTE — ED Triage Notes (Signed)
Pt reports yesterday while watching tv she had an episode of chest pain lasting about 30 minutes with spontaneous resolution.  Also had nausea and diaphoresis at that time.  Pt woke up this morning with same dull pain in center of chest and tingling in right arm.  States she also woke with dizziness.

## 2018-10-07 NOTE — ED Provider Notes (Signed)
[facility] EMERGENCY DEPARTMENT Provider Note   CSN: [id number] Arrival date & time: 10/07/18  0932     History   Chief Complaint Chief Complaint  Patient presents with  . Chest Pain    HPI Beth [name] is a 47 y.o. female.  Patient states yesterday she had some chest discomfort while she was watching a movie.  She also complained of some shortness of breath.  Patient feels fine now.  The history is provided by the patient. No language interpreter was used.  Chest Pain   This is a new problem. The current episode started less than 1 hour ago. The problem occurs rarely. The problem has been resolved. The pain is associated with rest. The pain is present in the substernal region. The pain is at a severity of 4/10. The pain is moderate. The quality of the pain is described as dull. Radiates to: Right arm. Pertinent negatives include no abdominal pain, no back pain, no cough and no headaches.  Pertinent negatives for past medical history include no seizures.    Past Medical History:  Diagnosis Date  . Constipation, chronic 08/08/2016  . Headache    migraines     Patient Active Problem List   Diagnosis Date Noted  . Tobacco abuse 08/08/2016  . Chronic anal fissure s/p partial sphincterotomy 08/30/2016 08/08/2016  . Constipation, chronic 08/08/2016    Past Surgical History:  Procedure Laterality Date  . ABDOMINAL HYSTERECTOMY    . COLONOSCOPY    . SPHINCTEROTOMY N/A 08/30/2016   Procedure: LATERAL INTERNAL AND SPHINCTEROTOMY;  Surgeon: Michael Boston, MD;  Location: [facility];  Service: General;  Laterality: N/A;  . TONSILLECTOMY       OB History    Gravida  3   Para  3   Term      Preterm      AB      Living  3     SAB      TAB      Ectopic      Multiple      Live Births               Home Medications    Prior to Admission medications   Medication Sig Start Date End Date Taking? Authorizing Provider  cetirizine (ZYRTEC) 10 MG tablet Take 10  mg by mouth daily as needed for allergies.   Yes [provider]  Multiple Vitamin (MULTIVITAMIN WITH MINERALS) TABS tablet Take 1 tablet by mouth daily.   Yes [provider]  spironolactone (ALDACTONE) 50 MG tablet Take 50 mg by mouth daily. 08/28/18  Yes [provider]    Family History Family History  Problem Relation Age of Onset  . Breast cancer Paternal Aunt     Social History Social History   Tobacco Use  . Smoking status: Current Every Day Smoker    Packs/day: 1.00    Years: 10.00    Pack years: 10.00    Types: Cigarettes    Last attempt to quit: 03/18/2012    Years since quitting: 6.5  . Smokeless tobacco: Never Used  Substance Use Topics  . Alcohol use: Yes    Comment: occ wine   . Drug use: No     Allergies   Sulfa drugs cross reactors   Review of Systems Review of Systems  Constitutional: Negative for appetite change and fatigue.  HENT: Negative for congestion, ear discharge and sinus pressure.   Eyes: Negative for discharge.  Respiratory:  Negative for cough.   Cardiovascular: Positive for chest pain.  Gastrointestinal: Negative for abdominal pain and diarrhea.  Genitourinary: Negative for frequency and hematuria.  Musculoskeletal: Negative for back pain.  Skin: Negative for rash.  Neurological: Negative for seizures and headaches.  Psychiatric/Behavioral: Negative for hallucinations.     Physical Exam Updated Vital Signs BP (!) 140/96   Pulse 78   Temp 98.3 F (36.8 C) (Oral)   Resp 11   Ht 5\' 7"  (1.702 m)   Wt 72.6 kg   LMP 03/13/2012   SpO2 97%   BMI 25.06 kg/m   Physical Exam  Constitutional: She is oriented to person, place, and time. She appears well-developed.  HENT:  Head: Normocephalic.  Eyes: Conjunctivae and EOM are normal. No scleral icterus.  Neck: Neck supple. No thyromegaly present.  Cardiovascular: Normal rate and regular rhythm. Exam reveals no gallop and no friction rub.  No murmur  heard. Pulmonary/Chest: No stridor. She has no wheezes. She has no rales. She exhibits no tenderness.  Abdominal: She exhibits no distension. There is no tenderness. There is no rebound.  Musculoskeletal: Normal range of motion. She exhibits no edema.  Lymphadenopathy:    She has no cervical adenopathy.  Neurological: She is oriented to person, place, and time. She exhibits normal muscle tone. Coordination normal.  Skin: No rash noted. No erythema.  Psychiatric: She has a normal mood and affect. Her behavior is normal.     ED Treatments / Results  Labs (all labs ordered are listed, but only abnormal results are displayed) Labs Reviewed  BASIC METABOLIC PANEL - Abnormal; Notable for the following components:      Result Value   Glucose, Bld 120 (*)    All other components within normal limits  CBC - Abnormal; Notable for the following components:   Hemoglobin 15.1 (*)    MCV 102.9 (*)    MCH 34.2 (*)    All other components within normal limits  HEPATIC FUNCTION PANEL - Abnormal; Notable for the following components:   AST 84 (*)    ALT 149 (*)    All other components within normal limits  DIFFERENTIAL  LIPASE, BLOOD  I-STAT TROPONIN, ED  I-STAT TROPONIN, ED    EKG None  Radiology Dg Chest 2 View  Result Date: 10/07/2018 CLINICAL DATA:  Chest pain, nausea and weakness. EXAM: CHEST - 2 VIEW COMPARISON:  None. FINDINGS: The heart size and mediastinal contours are within normal limits. Mild diffuse bronchial and interstitial prominence is likely chronic based on appearance. There is no evidence of pulmonary edema, consolidation, pneumothorax, nodule or pleural fluid. The visualized skeletal structures are unremarkable. IMPRESSION: No acute findings. Likely chronic bronchial and interstitial prominence, potentially related to long-term smoking. Electronically Signed   By: Aletta Edouard M.D.   On: 10/07/2018 08:29    Procedures Procedures (including critical care  time)  Medications Ordered in ED Medications - No data to display   Initial Impression / Assessment and Plan / ED Course  I have reviewed the triage vital signs and the nursing notes.  Pertinent labs & imaging results that were available during my care of the patient were reviewed by me and considered in my medical decision making (see chart for details).     Patient with atypical chest pain.  Troponin x2 and EKG unremarkable.  Patient no longer with chest discomfort.  She is referred to her primary doctor and cardiology for possible stress test Final Clinical Impressions(s) / ED Diagnoses  Final diagnoses:  Atypical chest pain    ED Discharge Orders    None       Milton Ferguson, MD 10/07/18 1310

## 2018-10-07 NOTE — Discharge Instructions (Addendum)
Follow-up with your family doctor or Dr. Bronson Ing in cardiology in the next week for recheck.  Return sooner if any problems.

## 2018-10-18 DIAGNOSIS — H6092 Unspecified otitis externa, left ear: Secondary | ICD-10-CM | POA: Diagnosis not present

## 2018-10-18 DIAGNOSIS — R079 Chest pain, unspecified: Secondary | ICD-10-CM | POA: Diagnosis not present

## 2018-10-18 DIAGNOSIS — F17200 Nicotine dependence, unspecified, uncomplicated: Secondary | ICD-10-CM | POA: Diagnosis not present

## 2018-10-18 DIAGNOSIS — R748 Abnormal levels of other serum enzymes: Secondary | ICD-10-CM | POA: Diagnosis not present

## 2018-10-25 DIAGNOSIS — H6092 Unspecified otitis externa, left ear: Secondary | ICD-10-CM | POA: Diagnosis not present

## 2018-10-25 DIAGNOSIS — Z6827 Body mass index (BMI) 27.0-27.9, adult: Secondary | ICD-10-CM | POA: Diagnosis not present

## 2018-10-25 DIAGNOSIS — J0100 Acute maxillary sinusitis, unspecified: Secondary | ICD-10-CM | POA: Diagnosis not present

## 2018-11-01 DIAGNOSIS — R079 Chest pain, unspecified: Secondary | ICD-10-CM | POA: Diagnosis not present

## 2018-11-13 ENCOUNTER — Ambulatory Visit (INDEPENDENT_AMBULATORY_CARE_PROVIDER_SITE_OTHER): Payer: BLUE CROSS/BLUE SHIELD | Admitting: Obstetrics and Gynecology

## 2018-11-13 ENCOUNTER — Encounter: Payer: Self-pay | Admitting: Obstetrics and Gynecology

## 2018-11-13 VITALS — BP 163/91 | HR 98 | Ht 67.0 in | Wt 164.0 lb

## 2018-11-13 DIAGNOSIS — Z1239 Encounter for other screening for malignant neoplasm of breast: Secondary | ICD-10-CM

## 2018-11-13 DIAGNOSIS — Z01419 Encounter for gynecological examination (general) (routine) without abnormal findings: Secondary | ICD-10-CM | POA: Diagnosis not present

## 2018-11-13 DIAGNOSIS — N951 Menopausal and female climacteric states: Secondary | ICD-10-CM

## 2018-11-13 DIAGNOSIS — F410 Panic disorder [episodic paroxysmal anxiety] without agoraphobia: Secondary | ICD-10-CM

## 2018-11-13 DIAGNOSIS — Z23 Encounter for immunization: Secondary | ICD-10-CM

## 2018-11-13 DIAGNOSIS — R740 Nonspecific elevation of levels of transaminase and lactic acid dehydrogenase [LDH]: Secondary | ICD-10-CM

## 2018-11-13 DIAGNOSIS — L03213 Periorbital cellulitis: Secondary | ICD-10-CM

## 2018-11-13 DIAGNOSIS — R7401 Elevation of levels of liver transaminase levels: Secondary | ICD-10-CM

## 2018-11-13 MED ORDER — HYDROXYZINE HCL 25 MG PO TABS: 25.0000 mg | ORAL_TABLET | Freq: Four times a day (QID) | ORAL | 2 refills | Status: DC | PRN

## 2018-11-13 MED ORDER — CLINDAMYCIN HCL 300 MG PO CAPS: 300.0000 mg | ORAL_CAPSULE | Freq: Three times a day (TID) | ORAL | 0 refills | Status: AC

## 2018-11-13 MED ORDER — AMOXICILLIN 875 MG PO TABS: 875.0000 mg | ORAL_TABLET | Freq: Two times a day (BID) | ORAL | 0 refills | Status: AC

## 2018-11-13 NOTE — Patient Instructions (Signed)
[facility] [address] [place] [address]  [facility] [place]  [address] [place] [address]  Phone: [phone]  

## 2018-11-13 NOTE — Progress Notes (Signed)
Gynecology Annual Exam  PCP: Caryl Bis, MD  Chief Complaint:  Chief Complaint  Patient presents with  . Gynecologic Exam    History of Present Illness: Patient is a 47 y.o. G3P3 presents for annual exam. The patient has no complaints today.   LMP: Patient's last menstrual period was 03/13/2012.  The patient is sexually active. She currently uses status post hysterectomy for contraception. She denies dyspareunia.  The patient does perform self breast exams.  There is no notable family history of breast or ovarian cancer in her family.  The patient wears seatbelts: yes.   The patient has regular exercise: not asked.    The patient reports current symptoms of depression.  She had recent visit to ER for atypical chest pain with negative work up so we discussed panic attack symptoms.    The patient is a 47 y.o. female presenting initial evaluation for symptoms of anxiety and depression.  The patient is currently taking norhing for the management of her symptoms.  She has had any recent situational stressors.  Raising grandson because of daughters struggle with drug addiction. She reports symptoms of anhedonia, day time somnolence, insomnia, irritability and social anxiety.  She denies risk taking behavior, feelings of worthlessness, suicidal ideation, homicidal ideation, auditory hallucinations and visual hallucinations.      The patient does not have a pre-existing history of depression and anxiety.  She  does a prior history of suicide attempts.    Did note swelling of the right eyelid about 2 days ago.  Some tenderness at the site, no tenderness or pain with eye movements.  No fevers, no chills.  No trauma or inciting event noted.    Review of Systems: Review of Systems  Constitutional: Negative for chills and fever.  HENT: Negative for congestion.   Respiratory: Negative for cough and shortness of breath.   Cardiovascular: Negative for chest pain and palpitations.    Gastrointestinal: Negative for abdominal pain, constipation, diarrhea, heartburn, nausea and vomiting.  Genitourinary: Negative for dysuria, frequency and urgency.  Skin: Negative for itching and rash.  Neurological: Negative for dizziness and headaches.  Endo/Heme/Allergies: Negative for polydipsia.  Psychiatric/Behavioral: Positive for depression. Negative for hallucinations, substance abuse and suicidal ideas. The patient is nervous/anxious and has insomnia.     Past Medical History:  Past Medical History:  Diagnosis Date  . Constipation, chronic 08/08/2016  . Headache    migraines     Past Surgical History:  Past Surgical History:  Procedure Laterality Date  . ABDOMINAL HYSTERECTOMY    . COLONOSCOPY    . SPHINCTEROTOMY N/A 08/30/2016   Procedure: LATERAL INTERNAL AND SPHINCTEROTOMY;  Surgeon: Michael Boston, MD;  Location: [facility];  Service: General;  Laterality: N/A;  . TONSILLECTOMY      Gynecologic History:  Patient's last menstrual period was 03/13/2012. Contraception: status post hysterectomy Last Pap: Results were: N/A Last mammogram: 01/15/2018 Results were: BI-RAD I  Obstetric History: G3P3  Family History:  Family History  Problem Relation Age of Onset  . Breast cancer Paternal Aunt     Social History:  Social History   Socioeconomic History  . Marital status: Married    Spouse name: Not on file  . Number of children: Not on file  . Years of education: Not on file  . Highest education level: Not on file  Occupational History  . Not on file  Social Needs  . Financial resource strain: Not on file  . Food insecurity:  Worry: Not on file    Inability: Not on file  . Transportation needs:    Medical: Not on file    Non-medical: Not on file  Tobacco Use  . Smoking status: Current Every Day Smoker    Packs/day: 1.00    Years: 10.00    Pack years: 10.00    Types: Cigarettes    Last attempt to quit: 03/18/2012    Years since quitting: 6.6  .  Smokeless tobacco: Never Used  Substance and Sexual Activity  . Alcohol use: Yes    Comment: occ wine   . Drug use: No  . Sexual activity: Yes    Birth control/protection: Surgical    Comment: Hysterectomy  Lifestyle  . Physical activity:    Days per week: 0 days    Minutes per session: 0 min  . Stress: Only a little  Relationships  . Social connections:    Talks on phone: More than three times a week    Gets together: Once a week    Attends religious service: Never    Active member of club or organization: No    Attends meetings of clubs or organizations: Never    Relationship status: Married  . Intimate partner violence:    Fear of current or ex partner: No    Emotionally abused: No    Physically abused: No    Forced sexual activity: No  Other Topics Concern  . Not on file  Social History Narrative  . Not on file    Allergies:  Allergies  Allergen Reactions  . Sulfa Drugs Cross Reactors Itching and Rash    Medications: Prior to Admission medications   Medication Sig Start Date End Date Taking? Authorizing Provider  cetirizine (ZYRTEC) 10 MG tablet Take 10 mg by mouth daily as needed for allergies.    [provider]  Multiple Vitamin (MULTIVITAMIN WITH MINERALS) TABS tablet Take 1 tablet by mouth daily.    [provider]  spironolactone (ALDACTONE) 50 MG tablet Take 50 mg by mouth daily. 08/28/18   [provider]    Physical Exam Vitals: Blood pressure (!) 163/91, pulse 98, height 5\' 7"  (1.702 m), weight 164 lb (74.4 kg), last menstrual period 03/13/2012.   General: NAD HEENT: normocephalic, anicteric Thyroid: no enlargement, no palpable nodules Pulmonary: No increased work of breathing, CTAB Cardiovascular: RRR, distal pulses 2+ Breast: Breast symmetrical, no tenderness, no palpable nodules or masses, no skin or nipple retraction present, no nipple discharge.  No axillary or supraclavicular lymphadenopathy. Abdomen: NABS, soft,  non-tender, non-distended.  Umbilicus without lesions.  No hepatomegaly, splenomegaly or masses palpable. No evidence of hernia  Genitourinary:  External: Normal external female genitalia.  Normal urethral meatus, normal Bartholin's and Skene's glands.    Vagina: Normal vaginal mucosa, no evidence of prolapse.    Cervix: surgically absent  Uterus: surgically absent  Adnexa: ovaries non-enlarged, no adnexal masses  Rectal: deferred  Lymphatic: no evidence of inguinal lymphadenopathy Extremities: no edema, erythema, or tenderness Neurologic: Grossly intact Psychiatric: mood appropriate, affect full  Female chaperone present for pelvic and breast  portions of the physical exam    Assessment: 47 y.o. G3P3 routine annual exam  Plan: Problem List Items Addressed This Visit    None    Visit Diagnoses    Encounter for gynecological examination without abnormal finding    -  Primary   Breast screening       Relevant Orders   MM 3D SCREEN BREAST BILATERAL  Need for influenza vaccination       Panic attacks       Relevant Medications   hydrOXYzine (ATARAX/VISTARIL) 25 MG tablet   Perimenopausal vasomotor symptoms       Relevant Orders   Follicle stimulating hormone   Estradiol   Elevated ALT measurement       Periorbital cellulitis of right eye          1) Mammogram - recommend yearly screening mammogram.  Mammogram Is up to date   2) STI screening  was notoffered and therefore not obtained  3) ASCCP guidelines and rational discussed.  Patient opts for discontinue secondary to prior hysterectomy screening interval  4) Contraception - the patient is currently using  status post hysterectomy.  She is not currently in need of contraception secondary to being sterile  5) Colonoscopy -- Screening recommended starting at age 30 for average risk individuals, age 31 for individuals deemed at increased risk (including African Americans) and recommended to continue until age 56.  For  patient age 21-85 individualized approach is recommended.  Gold standard screening is via colonoscopy, Cologuard screening is an acceptable alternative for patient unwilling or unable to undergo colonoscopy.  "Colorectal cancer screening for average?risk adults: 2018 guideline update from the American Cancer Society"[place]: A Cancer Journal for Clinicians: May 16, 2017   6) Routine healthcare maintenance including cholesterol, diabetes screening discussed managed by PCP  7) Panic attack - Rx vistaril, discussed long term treatment options include daily SSRI medications pending frequency and severity of epsidoes  8) Vasomotor symptoms - discussed options consider clonidine given BP issues.  FSH/estradiol  8) Periorbital cellulitis - Rx clindamycin and amoxicillin for 10 days  9) Elevated BP reading - on spironolactone, managed per PCP  10)  Return in about 2 months (around 01/13/2019) for medication follow up, KP nurse visit later today for influenza vaccination.   [name], MD, [facility], [facility] 11/13/2018, 8:40 AM

## 2018-11-14 LAB — ESTRADIOL: Estradiol: 155.7 pg/mL

## 2018-11-14 LAB — COMPREHENSIVE METABOLIC PANEL
A/G RATIO: 1.8 (ref 1.2–2.2)
ALT: 154 IU/L — AB (ref 0–32)
AST: 89 IU/L — ABNORMAL HIGH (ref 0–40)
Albumin: 4.5 g/dL (ref 3.5–5.5)
Alkaline Phosphatase: 74 IU/L (ref 39–117)
BUN/Creatinine Ratio: 13 (ref 9–23)
BUN: 9 mg/dL (ref 6–24)
Bilirubin Total: 0.5 mg/dL (ref 0.0–1.2)
CALCIUM: 9.5 mg/dL (ref 8.7–10.2)
CO2: 21 mmol/L (ref 20–29)
Chloride: 102 mmol/L (ref 96–106)
Creatinine, Ser: 0.71 mg/dL (ref 0.57–1.00)
GFR, EST AFRICAN AMERICAN: 116 mL/min/{1.73_m2} (ref >59)
GFR, EST NON AFRICAN AMERICAN: 101 mL/min/{1.73_m2} (ref >59)
GLOBULIN, TOTAL: 2.5 g/dL (ref 1.5–4.5)
Glucose: 91 mg/dL (ref 65–99)
POTASSIUM: 3.8 mmol/L (ref 3.5–5.2)
Sodium: 138 mmol/L (ref 134–144)
TOTAL PROTEIN: 7 g/dL (ref 6.0–8.5)

## 2018-11-14 LAB — FOLLICLE STIMULATING HORMONE: FSH: 2.2 m[IU]/mL

## 2018-11-25 ENCOUNTER — Ambulatory Visit: Payer: BLUE CROSS/BLUE SHIELD

## 2018-11-26 ENCOUNTER — Ambulatory Visit (INDEPENDENT_AMBULATORY_CARE_PROVIDER_SITE_OTHER): Payer: BLUE CROSS/BLUE SHIELD

## 2018-11-26 DIAGNOSIS — Z23 Encounter for immunization: Secondary | ICD-10-CM

## 2019-01-08 DIAGNOSIS — L570 Actinic keratosis: Secondary | ICD-10-CM | POA: Diagnosis not present

## 2019-01-08 DIAGNOSIS — L709 Acne, unspecified: Secondary | ICD-10-CM | POA: Diagnosis not present

## 2019-01-08 DIAGNOSIS — L309 Dermatitis, unspecified: Secondary | ICD-10-CM | POA: Diagnosis not present

## 2019-01-16 ENCOUNTER — Ambulatory Visit: Payer: BLUE CROSS/BLUE SHIELD | Admitting: Obstetrics and Gynecology

## 2019-01-31 ENCOUNTER — Ambulatory Visit
Admission: RE | Admit: 2019-01-31 | Discharge: 2019-01-31 | Disposition: A | Discharge status: Home / Self Care | Payer: BLUE CROSS/BLUE SHIELD | Source: Ambulatory Visit | Attending: Obstetrics and Gynecology | Admitting: Obstetrics and Gynecology

## 2019-01-31 DIAGNOSIS — Z1231 Encounter for screening mammogram for malignant neoplasm of breast: Secondary | ICD-10-CM | POA: Diagnosis not present

## 2019-01-31 DIAGNOSIS — Z1239 Encounter for other screening for malignant neoplasm of breast: Secondary | ICD-10-CM

## 2019-01-31 IMAGING — MG DIGITAL SCREENING BILATERAL MAMMOGRAM WITH TOMO AND CAD
8 series · 8 of 24 positions shown · non-contrast
Comparison: Previous exam(s).

CLINICAL DATA: Screening.

EXAM:
DIGITAL SCREENING BILATERAL MAMMOGRAM WITH TOMO AND CAD

[L MLO synth-2D]
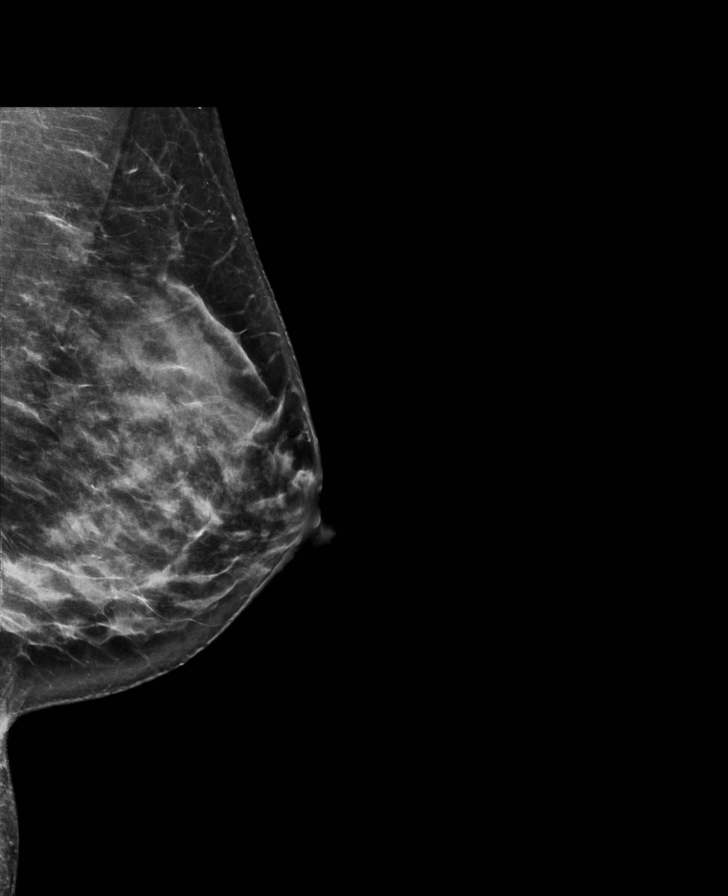

[R MLO synth-2D]
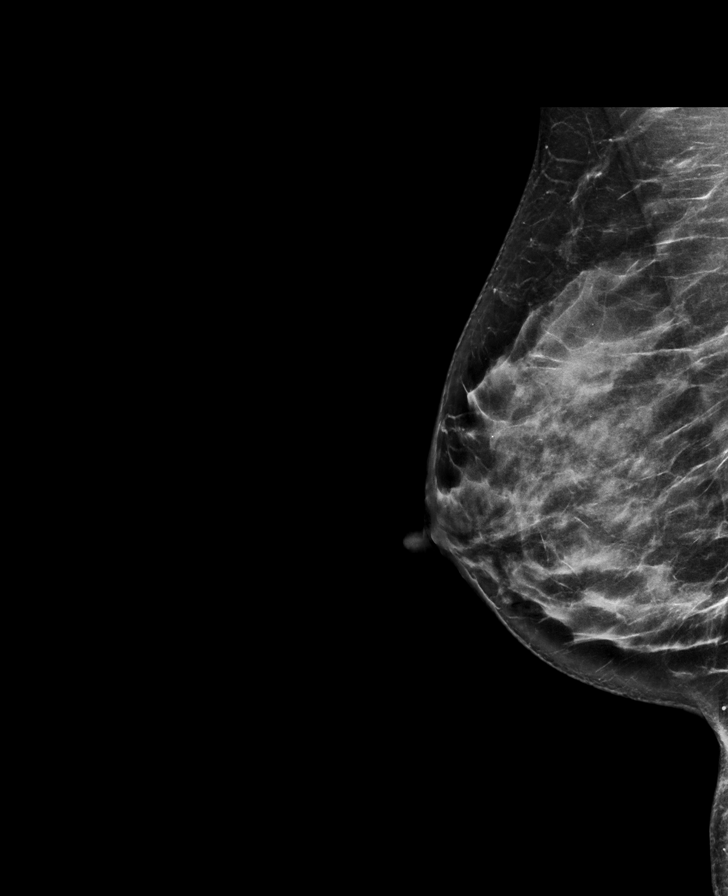

[R CC synth-2D]
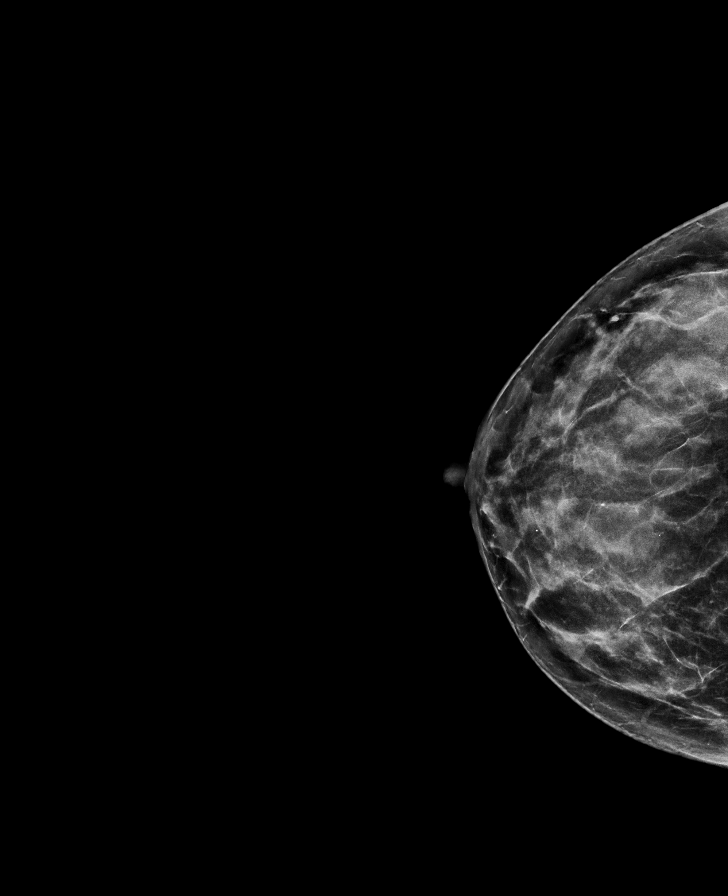

[L CC synth-2D]
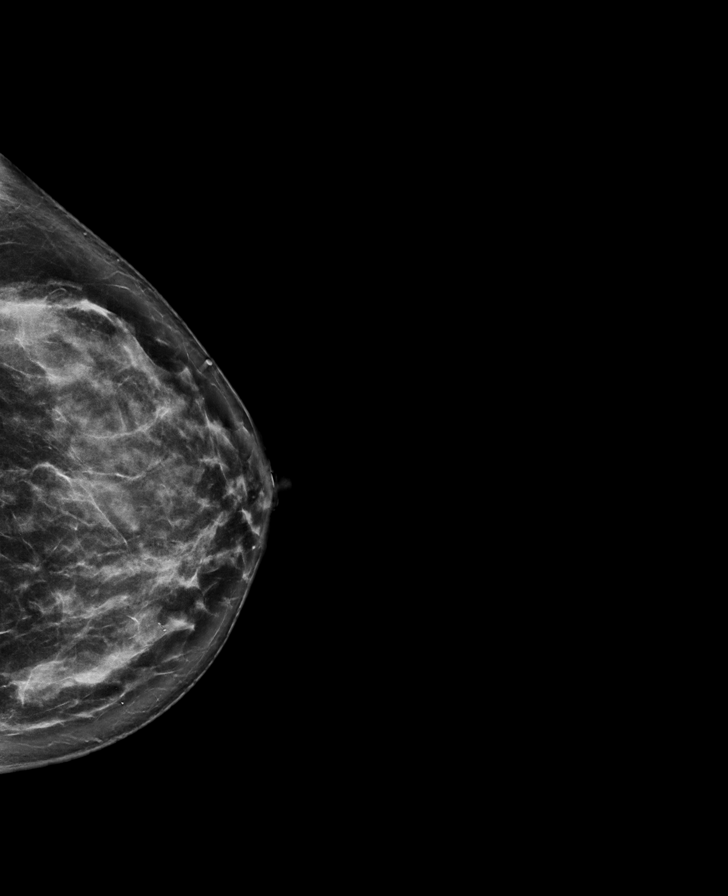

[L MLO tomo · tomo slice 39/77.0]
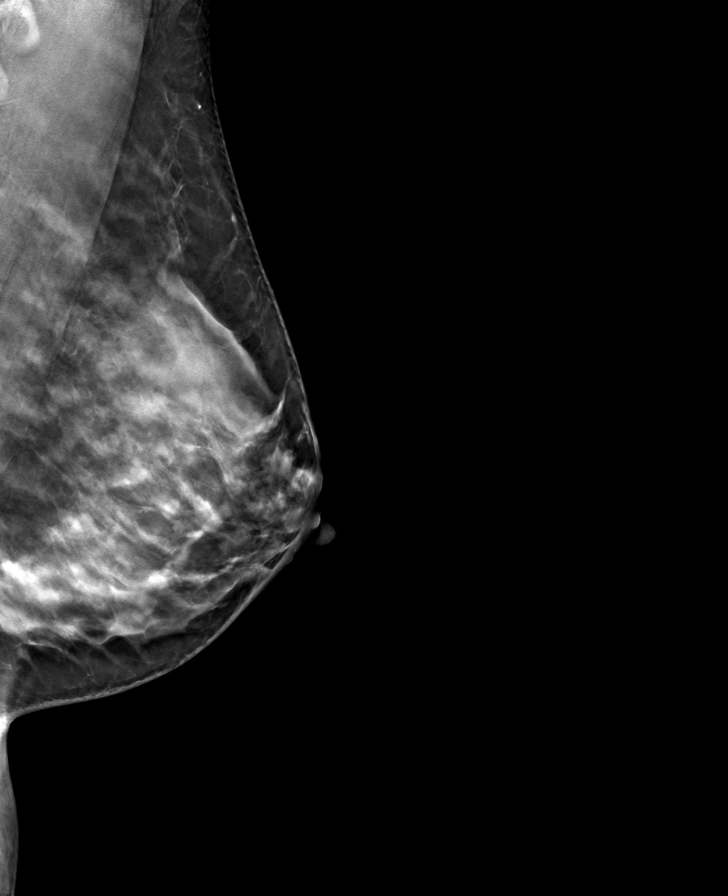

[L CC tomo · tomo slice 42/83.0]
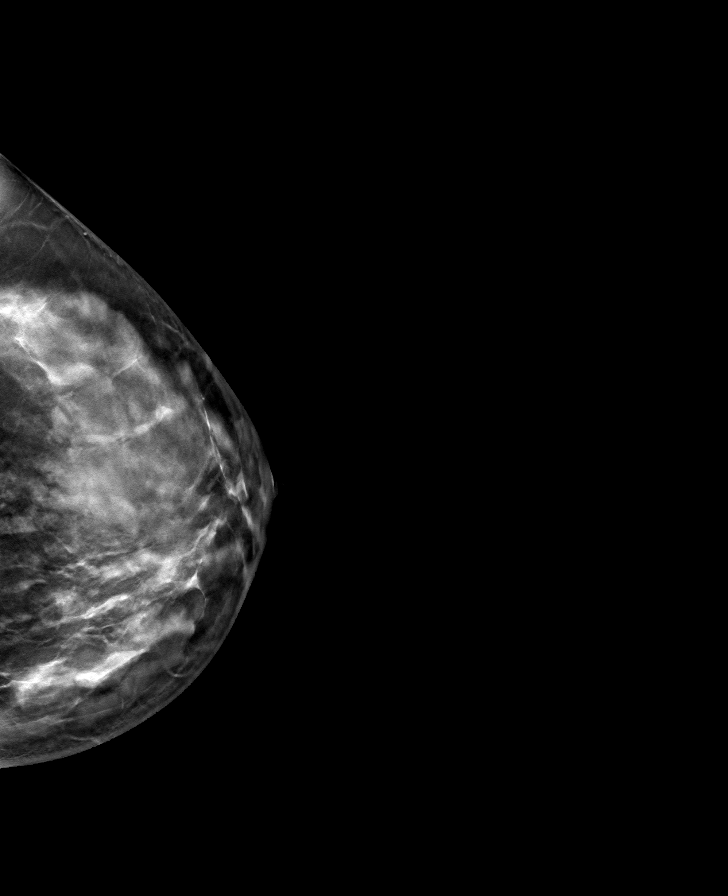

[R MLO tomo · tomo slice 37/72.0]
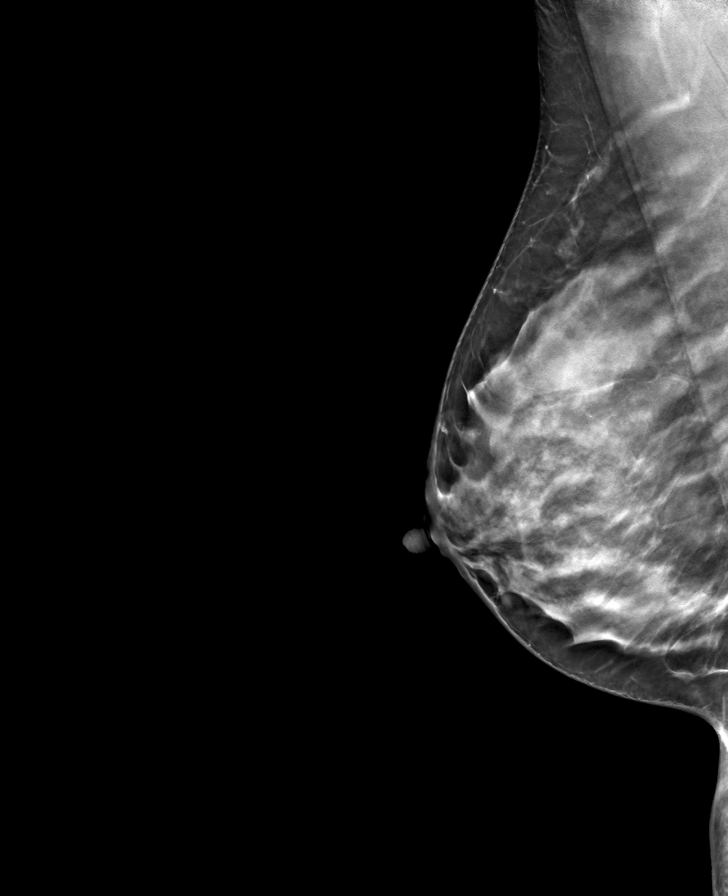

[R CC tomo · tomo slice 32/63.0]
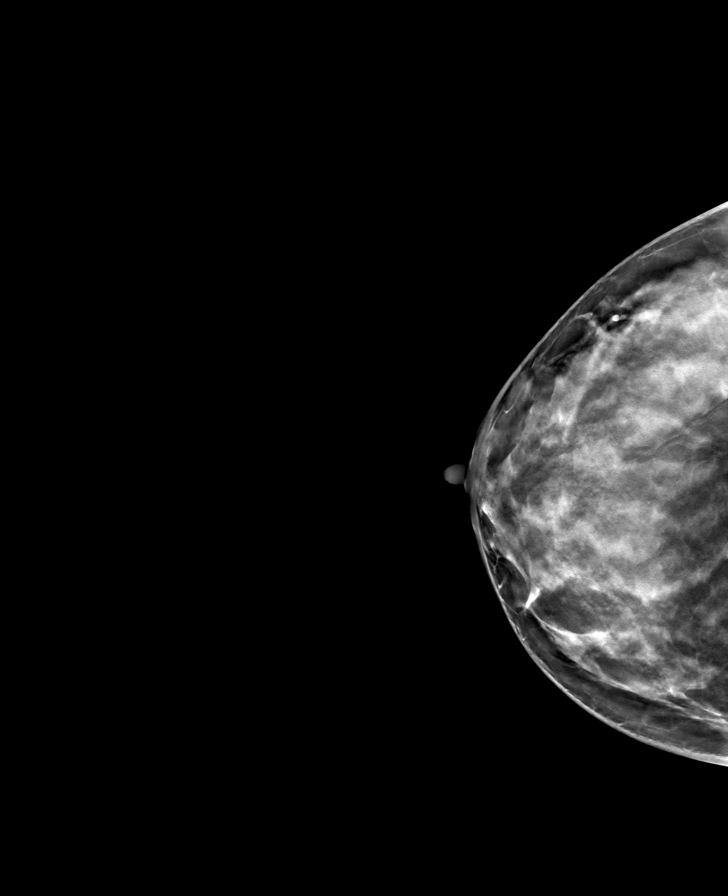

[8 of 24 positions shown; findings below may reference images not displayed]

ACR Breast Density Category c: The breast tissue is heterogeneously
dense, which may obscure small masses.
FINDINGS: There are no findings suspicious for malignancy. Images were
processed with CAD.
IMPRESSION: No mammographic evidence of malignancy. A result letter of this
screening mammogram will be mailed directly to the patient.

RECOMMENDATION:
Screening mammogram in one year. (Code:[5V])

BI-RADS CATEGORY  1: Negative.

## 2019-11-17 ENCOUNTER — Encounter: Payer: Self-pay | Admitting: Obstetrics and Gynecology

## 2019-11-17 ENCOUNTER — Ambulatory Visit (INDEPENDENT_AMBULATORY_CARE_PROVIDER_SITE_OTHER): Payer: 59 | Admitting: Obstetrics and Gynecology

## 2019-11-17 ENCOUNTER — Other Ambulatory Visit: Payer: Self-pay

## 2019-11-17 VITALS — BP 128/88 | HR 89 | Ht 65.0 in | Wt 162.0 lb

## 2019-11-17 DIAGNOSIS — Z131 Encounter for screening for diabetes mellitus: Secondary | ICD-10-CM

## 2019-11-17 DIAGNOSIS — Z01419 Encounter for gynecological examination (general) (routine) without abnormal findings: Secondary | ICD-10-CM

## 2019-11-17 DIAGNOSIS — Z1329 Encounter for screening for other suspected endocrine disorder: Secondary | ICD-10-CM

## 2019-11-17 DIAGNOSIS — Z1321 Encounter for screening for nutritional disorder: Secondary | ICD-10-CM

## 2019-11-17 DIAGNOSIS — Z1239 Encounter for other screening for malignant neoplasm of breast: Secondary | ICD-10-CM

## 2019-11-17 DIAGNOSIS — Z13220 Encounter for screening for lipoid disorders: Secondary | ICD-10-CM

## 2019-11-17 NOTE — Progress Notes (Signed)
Gynecology Annual Exam  PCP: Caryl Bis, MD  Chief Complaint:  Chief Complaint  Patient presents with  . Gynecologic Exam    History of Present Illness: Patient is a 48 y.o. G3P3 presents for annual exam. The patient has no complaints today.   LMP: Patient's last menstrual period was 03/13/2012.  The patient is sexually active. She currently uses status post hysterectomy for contraception. She denies dyspareunia.  The patient does perform self breast exams.  There is no notable family history of breast or ovarian cancer in her family.  The patient wears seatbelts: yes.   The patient has regular exercise: not asked.    The patient denies current symptoms of depression.    Review of Systems: Review of Systems  Constitutional: Negative for chills and fever.  HENT: Negative for congestion.   Respiratory: Negative for cough and shortness of breath.   Cardiovascular: Negative for chest pain and palpitations.  Gastrointestinal: Negative for abdominal pain, constipation, diarrhea, heartburn, nausea and vomiting.  Genitourinary: Negative for dysuria, frequency and urgency.  Skin: Negative for itching and rash.  Neurological: Negative for dizziness and headaches.  Endo/Heme/Allergies: Negative for polydipsia.  Psychiatric/Behavioral: Negative for depression.    Past Medical History:  Past Medical History:  Diagnosis Date  . Constipation, chronic 08/08/2016  . Headache    migraines     Past Surgical History:  Past Surgical History:  Procedure Laterality Date  . ABDOMINAL HYSTERECTOMY    . COLONOSCOPY    . SPHINCTEROTOMY N/A 08/30/2016   Procedure: LATERAL INTERNAL AND SPHINCTEROTOMY;  Surgeon: Michael Boston, MD;  Location: [facility];  Service: General;  Laterality: N/A;  . TONSILLECTOMY      Gynecologic History:  Patient's last menstrual period was 03/13/2012. Contraception: status post hysterectomy Last mammogram: 01/31/2019 Results were: BI-RAD I  Obstetric History:  G3P3  Family History:  Family History  Problem Relation Age of Onset  . Breast cancer Paternal Aunt     Social History:  Social History   Socioeconomic History  . Marital status: Married    Spouse name: Not on file  . Number of children: Not on file  . Years of education: Not on file  . Highest education level: Not on file  Occupational History  . Not on file  Social Needs  . Financial resource strain: Not on file  . Food insecurity    Worry: Not on file    Inability: Not on file  . Transportation needs    Medical: Not on file    Non-medical: Not on file  Tobacco Use  . Smoking status: Current Every Day Smoker    Packs/day: 1.00    Years: 10.00    Pack years: 10.00    Types: Cigarettes    Last attempt to quit: 03/18/2012    Years since quitting: 7.6  . Smokeless tobacco: Never Used  Substance and Sexual Activity  . Alcohol use: Yes    Comment: occ wine   . Drug use: No  . Sexual activity: Yes    Birth control/protection: Surgical    Comment: Hysterectomy  Lifestyle  . Physical activity    Days per week: 0 days    Minutes per session: 0 min  . Stress: Only a little  Relationships  . Social connections    Talks on phone: More than three times a week    Gets together: Once a week    Attends religious service: Never    Active member of club or  organization: No    Attends meetings of clubs or organizations: Never    Relationship status: Married  . Intimate partner violence    Fear of current or ex partner: No    Emotionally abused: No    Physically abused: No    Forced sexual activity: No  Other Topics Concern  . Not on file  Social History Narrative  . Not on file    Allergies:  Allergies  Allergen Reactions  . Clindamycin/Lincomycin Rash  . Sulfa Drugs Cross Reactors Itching and Rash    Medications: Prior to Admission medications   Medication Sig Start Date End Date Taking? Authorizing Provider  cetirizine (ZYRTEC) 10 MG tablet Take 10 mg by  mouth daily as needed for allergies.   Yes [provider]  hydrOXYzine (ATARAX/VISTARIL) 25 MG tablet Take 1 tablet (25 mg total) by mouth every 6 (six) hours as needed for anxiety. 11/13/18  Yes [name], MD  Multiple Vitamin (MULTIVITAMIN WITH MINERALS) TABS tablet Take 1 tablet by mouth daily.   Yes [provider]  spironolactone (ALDACTONE) 50 MG tablet Take 50 mg by mouth daily. 08/28/18  Yes [provider]    Physical Exam Vitals: Blood pressure 128/88, pulse 89, height 5\' 5"  (1.651 m), weight 162 lb (73.5 kg), last menstrual period 03/13/2012.  General: NAD HEENT: normocephalic, anicteric Thyroid: no enlargement, no palpable nodules Pulmonary: No increased work of breathing, CTAB Cardiovascular: RRR, distal pulses 2+ Breast: Breast symmetrical, no tenderness, no palpable nodules or masses, no skin or nipple retraction present, no nipple discharge.  No axillary or supraclavicular lymphadenopathy. Abdomen: NABS, soft, non-tender, non-distended.  Umbilicus without lesions.  No hepatomegaly, splenomegaly or masses palpable. No evidence of hernia  Genitourinary:  External: Normal external female genitalia.  Normal urethral meatus, normal Bartholin's and Skene's glands.    Vagina: Normal vaginal mucosa, no evidence of prolapse.    Cervix: surgically absent  Uterus: surgically absent  Adnexa: ovaries non-enlarged, no adnexal masses  Rectal: deferred  Lymphatic: no evidence of inguinal lymphadenopathy Extremities: no edema, erythema, or tenderness Neurologic: Grossly intact Psychiatric: mood appropriate, affect full  Female chaperone present for pelvic and breast  portions of the physical exam    Assessment: 48 y.o. G3P3 routine annual exam  Plan: Problem List Items Addressed This Visit    None    Visit Diagnoses    Encounter for gynecological examination without abnormal finding    -  Primary   Relevant Orders   Executive panel   Vitamin  D (25 hydroxy)   Breast screening       Relevant Orders   MM 3D SCREEN BREAST BILATERAL   Encounter for vitamin deficiency screening       Relevant Orders   Vitamin D (25 hydroxy)   Screening for diabetes mellitus       Relevant Orders   Executive panel   Thyroid disorder screening       Relevant Orders   Executive panel   Lipid screening       Relevant Orders   Executive panel      1) Mammogram - recommend yearly screening mammogram.  Mammogram Is up to date   2) STI screening  was notoffered and therefore not obtained  3) ASCCP guidelines and rational discussed.  Patient opts for discontinue secondary to prior hysterectomy screening interval  4) Contraception - the patient is currently using  status post hysterectomy.  She is not currently in need of contraception secondary to being sterile  5) Colonoscopy -- Screening recommended starting at age 60 for average risk individuals, age 84 for individuals deemed at increased risk (including African Americans) and recommended to continue until age 95.  For patient age 79-85 individualized approach is recommended.  Gold standard screening is via colonoscopy, Cologuard screening is an acceptable alternative for patient unwilling or unable to undergo colonoscopy.  "Colorectal cancer screening for average?risk adults: 2018 guideline update from the American Cancer Society"CA: A Cancer Journal for Clinicians: May 16, 2017   6) Routine healthcare maintenance including cholesterol, diabetes screening discussed Ordered today  7) Return in about 1 year (around 11/16/2020) for annual.   [name], MD, [facility], [place] 11/17/2019, 8:48 AM

## 2019-11-17 NOTE — Patient Instructions (Signed)
[facility] [address] [place] [address]  [facility]  [address] [place] [address]  Phone: [phone]  

## 2019-11-18 LAB — CMP14+LP+TP+TSH+CBC/PLT
ALT: 117 IU/L — ABNORMAL HIGH (ref 0–32)
AST: 78 IU/L — ABNORMAL HIGH (ref 0–40)
Albumin/Globulin Ratio: 1.8 (ref 1.2–2.2)
Albumin: 4.5 g/dL (ref 3.8–4.8)
Alkaline Phosphatase: 65 IU/L (ref 39–117)
BUN/Creatinine Ratio: 14 (ref 9–23)
BUN: 10 mg/dL (ref 6–24)
Bilirubin Total: 0.4 mg/dL (ref 0.0–1.2)
CO2: 20 mmol/L (ref 20–29)
Calcium: 9.4 mg/dL (ref 8.7–10.2)
Chloride: 101 mmol/L (ref 96–106)
Cholesterol, Total: 217 mg/dL — ABNORMAL HIGH (ref 100–199)
Creatinine, Ser: 0.7 mg/dL (ref 0.57–1.00)
Free Thyroxine Index: 1.7 (ref 1.2–4.9)
GFR calc Af Amer: 118 mL/min/{1.73_m2} (ref >59)
GFR calc non Af Amer: 102 mL/min/{1.73_m2} (ref >59)
Globulin, Total: 2.5 g/dL (ref 1.5–4.5)
Glucose: 107 mg/dL — ABNORMAL HIGH (ref 65–99)
HDL: 49 mg/dL (ref >39)
Hematocrit: 43.9 % (ref 34.0–46.6)
Hemoglobin: 15.1 g/dL (ref 11.1–15.9)
LDL Chol Calc (NIH): 143 mg/dL — ABNORMAL HIGH (ref 0–99)
LDL/HDL Ratio: 2.9 ratio (ref 0.0–3.2)
MCH: 33.3 pg — ABNORMAL HIGH (ref 26.6–33.0)
MCHC: 34.4 g/dL (ref 31.5–35.7)
MCV: 97 fL (ref 79–97)
Platelets: 164 10*3/uL (ref 150–450)
Potassium: 4.1 mmol/L (ref 3.5–5.2)
RBC: 4.53 x10E6/uL (ref 3.77–5.28)
RDW: 12.5 % (ref 11.7–15.4)
Sodium: 138 mmol/L (ref 134–144)
T3 Uptake Ratio: 25 % (ref 24–39)
T4, Total: 6.7 ug/dL (ref 4.5–12.0)
TSH: 2.11 u[IU]/mL (ref 0.450–4.500)
Total Protein: 7 g/dL (ref 6.0–8.5)
Triglycerides: 139 mg/dL (ref 0–149)
VLDL Cholesterol Cal: 25 mg/dL (ref 5–40)
WBC: 8 10*3/uL (ref 3.4–10.8)

## 2019-11-18 LAB — VITAMIN D 25 HYDROXY (VIT D DEFICIENCY, FRACTURES): Vit D, 25-Hydroxy: 22.9 ng/mL — ABNORMAL LOW (ref 30.0–100.0)

## 2019-12-04 ENCOUNTER — Other Ambulatory Visit: Payer: Self-pay | Admitting: Obstetrics and Gynecology

## 2019-12-04 MED ORDER — VITAMIN D (ERGOCALCIFEROL) 1.25 MG (50000 UNIT) PO CAPS: 50000.0000 [IU] | ORAL_CAPSULE | ORAL | 0 refills | Status: DC

## 2019-12-24 ENCOUNTER — Other Ambulatory Visit: Payer: Self-pay | Admitting: Obstetrics and Gynecology

## 2019-12-24 MED ORDER — VITAMIN D (ERGOCALCIFEROL) 1.25 MG (50000 UNIT) PO CAPS: 50000.0000 [IU] | ORAL_CAPSULE | ORAL | 0 refills | Status: AC

## 2019-12-24 NOTE — Telephone Encounter (Signed)
Has been sent I though she did the 5 week already

## 2019-12-24 NOTE — Telephone Encounter (Signed)
Patient states [facility] sent her in rx for Vitamin D for 5 weeks. Her insurance would only cover 3 pills. Today her refill was denied for Vitamin D. She is inquiring if [facility] only wants her to take the 3 or if he will send in more for her to complete the 5 weeks. Cb#[phone]

## 2020-01-14 ENCOUNTER — Other Ambulatory Visit: Payer: Self-pay | Admitting: Obstetrics and Gynecology

## 2020-01-14 NOTE — Telephone Encounter (Signed)
Advise if pt needs Vitamin D level drawn before refilling

## 2020-03-01 ENCOUNTER — Ambulatory Visit
Admission: RE | Admit: 2020-03-01 | Discharge: 2020-03-01 | Disposition: A | Discharge status: Home / Self Care | Payer: 59 | Source: Ambulatory Visit | Attending: Obstetrics and Gynecology | Admitting: Obstetrics and Gynecology

## 2020-03-01 DIAGNOSIS — Z1231 Encounter for screening mammogram for malignant neoplasm of breast: Secondary | ICD-10-CM | POA: Diagnosis present

## 2020-03-01 DIAGNOSIS — Z1239 Encounter for other screening for malignant neoplasm of breast: Secondary | ICD-10-CM

## 2020-03-01 IMAGING — MG DIGITAL SCREENING BILAT W/ TOMO W/ CAD
8 series · 9 of 24 positions shown · non-contrast
Comparison: Previous exam(s).

CLINICAL DATA: Screening.

EXAM:
DIGITAL SCREENING BILATERAL MAMMOGRAM WITH TOMO AND CAD

[R MLO synth-2D]
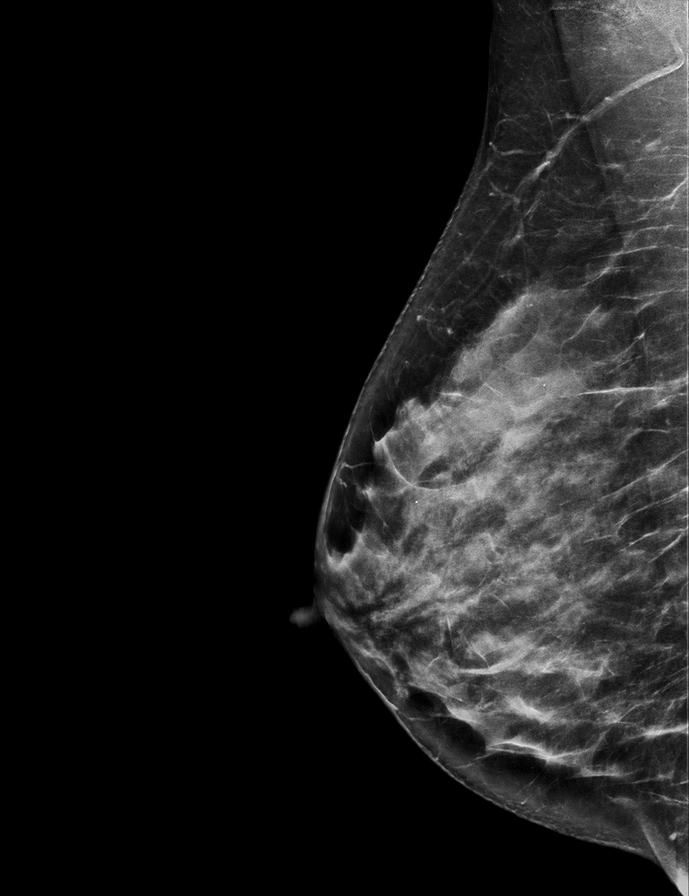

[R CC synth-2D]
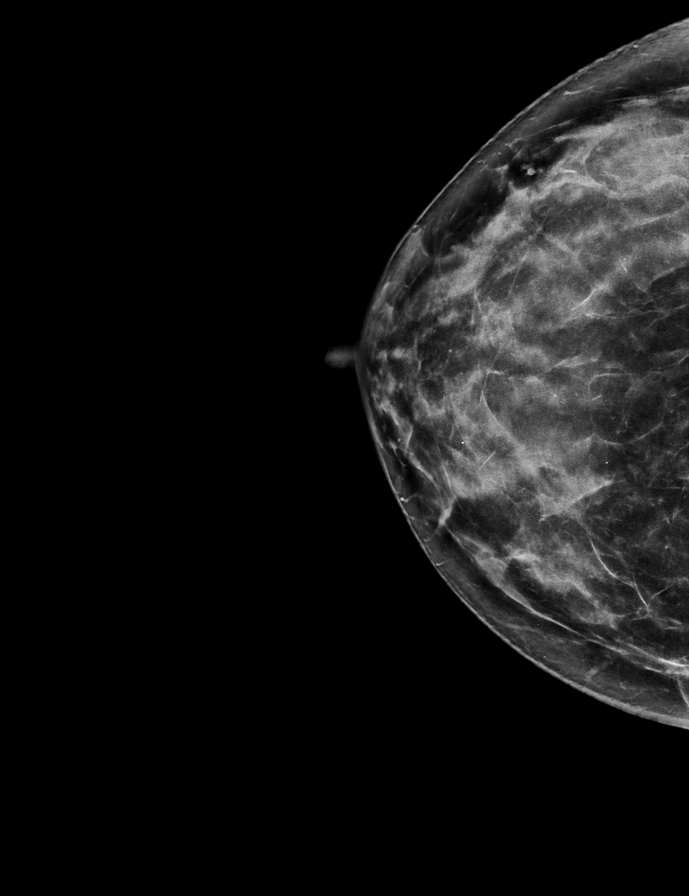

[L CC synth-2D]
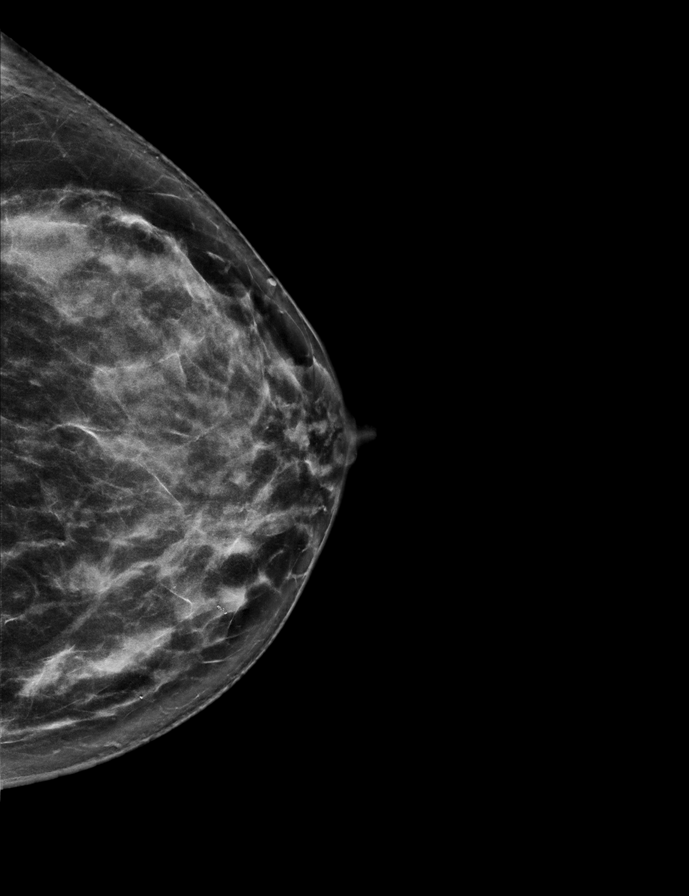

[L MLO synth-2D]
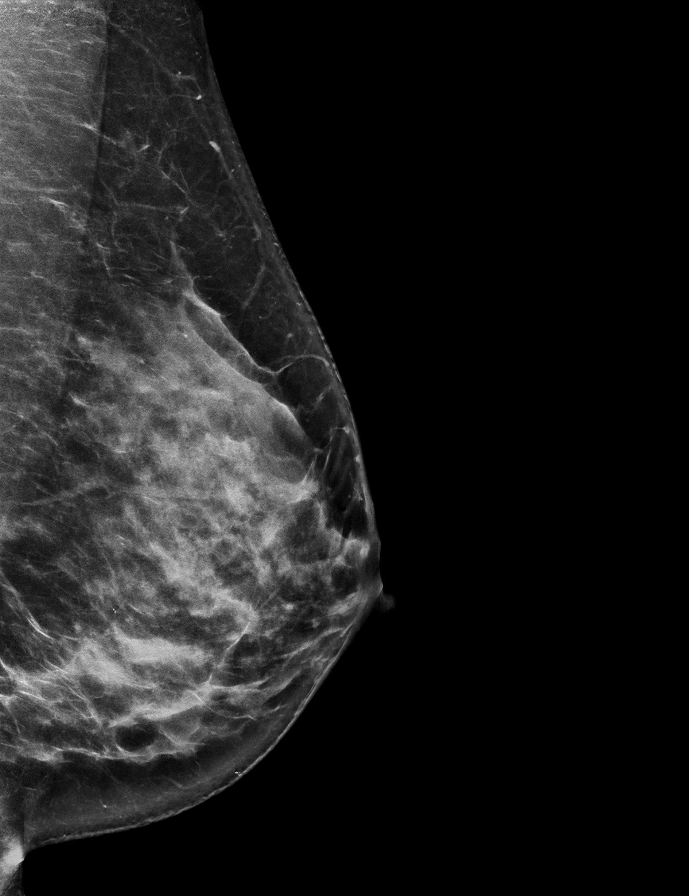

[R MLO tomo · 2 of 70 frames shown]
[frame 23/70]
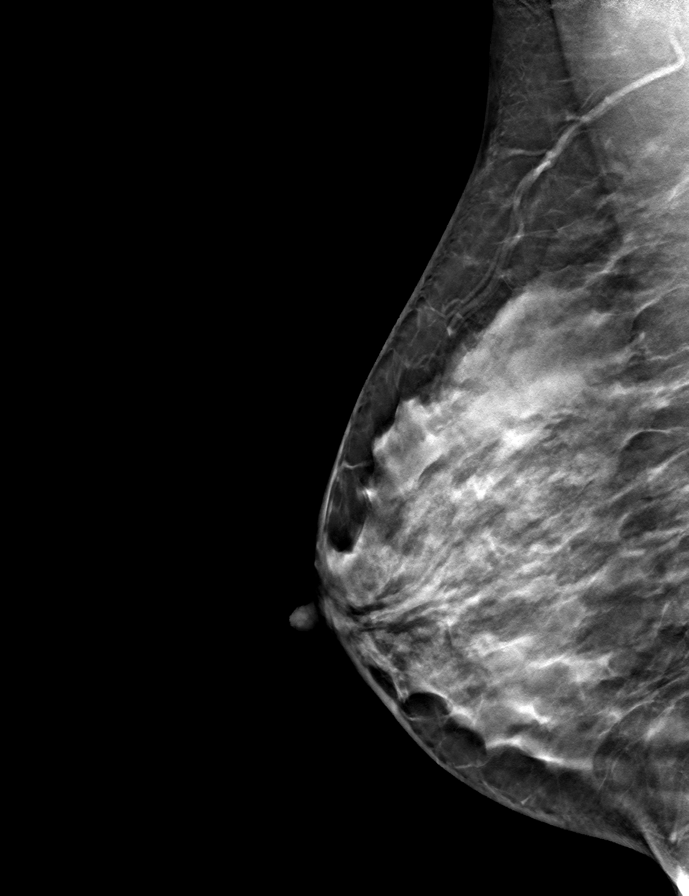
[frame 35/70]
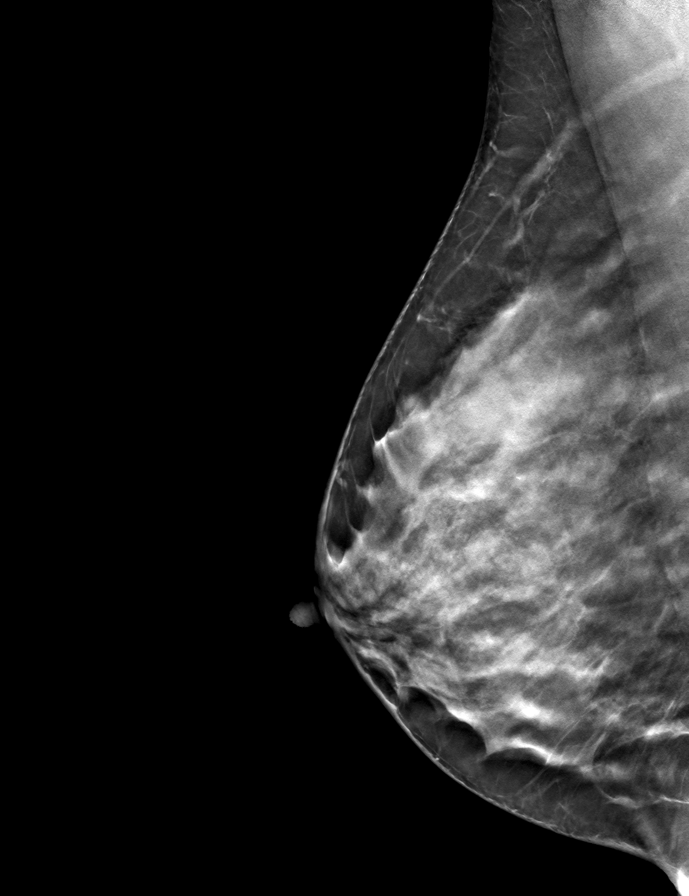

[L CC tomo · tomo slice 33/66.0]
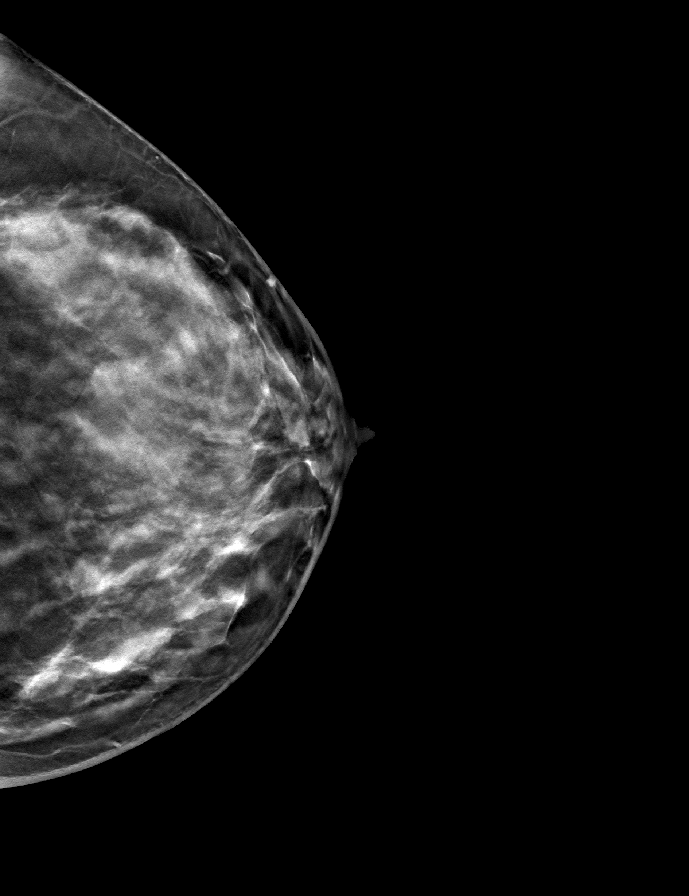

[L MLO tomo · tomo slice 37/73.0]
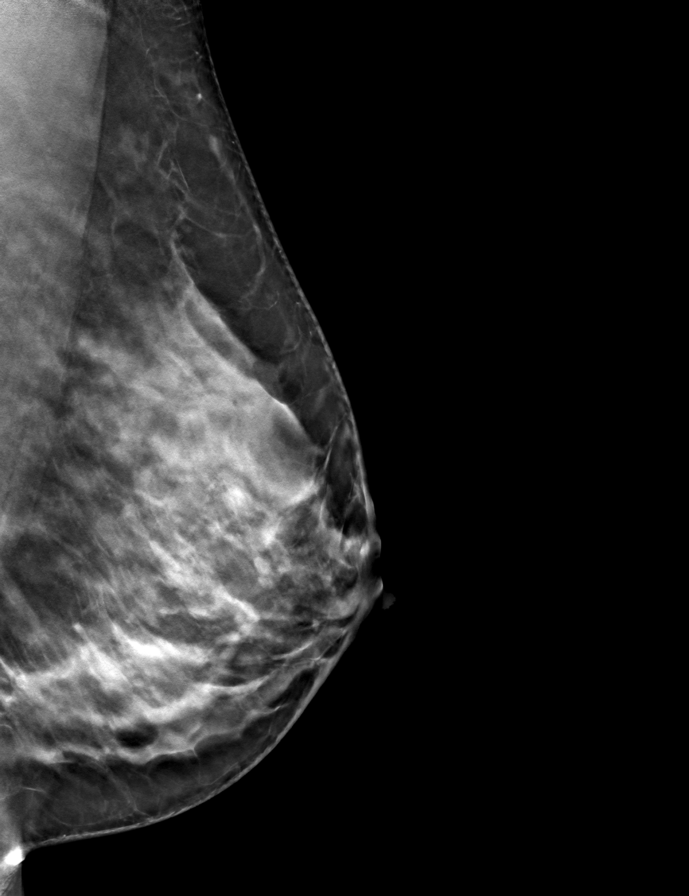

[R CC tomo · tomo slice 33/66.0]
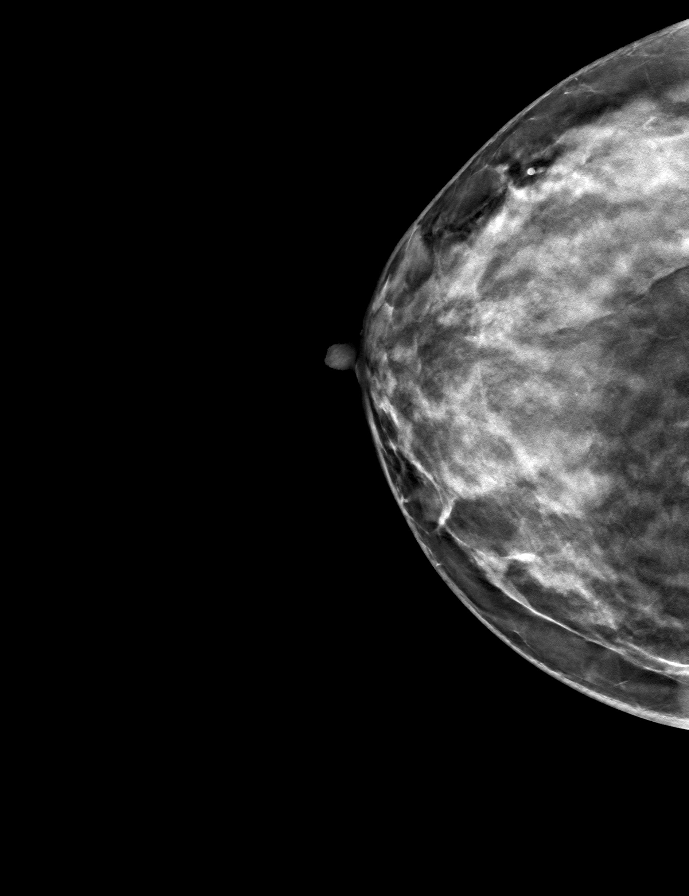

[9 of 24 positions shown; findings below may reference images not displayed]

ACR Breast Density Category d: The breast tissue is extremely dense,
which lowers the sensitivity of mammography
FINDINGS: There are no findings suspicious for malignancy. Images were
processed with CAD.
IMPRESSION: No mammographic evidence of malignancy. A result letter of this
screening mammogram will be mailed directly to the patient.

RECOMMENDATION:
Screening mammogram in one year. (Code:[5I])

BI-RADS CATEGORY  1: Negative.

## 2020-08-26 DIAGNOSIS — L709 Acne, unspecified: Secondary | ICD-10-CM | POA: Diagnosis not present

## 2020-11-29 ENCOUNTER — Other Ambulatory Visit: Payer: Self-pay

## 2020-11-29 ENCOUNTER — Encounter: Payer: Self-pay | Admitting: Obstetrics and Gynecology

## 2020-11-29 ENCOUNTER — Ambulatory Visit (INDEPENDENT_AMBULATORY_CARE_PROVIDER_SITE_OTHER): Payer: BC Managed Care – PPO | Admitting: Obstetrics and Gynecology

## 2020-11-29 VITALS — BP 126/82 | Ht 67.0 in | Wt 164.0 lb

## 2020-11-29 DIAGNOSIS — Z23 Encounter for immunization: Secondary | ICD-10-CM | POA: Diagnosis not present

## 2020-11-29 DIAGNOSIS — Z1239 Encounter for other screening for malignant neoplasm of breast: Secondary | ICD-10-CM | POA: Diagnosis not present

## 2020-11-29 DIAGNOSIS — D013 Carcinoma in situ of anus and anal canal: Secondary | ICD-10-CM | POA: Diagnosis not present

## 2020-11-29 DIAGNOSIS — Z01419 Encounter for gynecological examination (general) (routine) without abnormal findings: Secondary | ICD-10-CM | POA: Diagnosis not present

## 2020-11-29 DIAGNOSIS — Z1211 Encounter for screening for malignant neoplasm of colon: Secondary | ICD-10-CM

## 2020-11-29 DIAGNOSIS — R1011 Right upper quadrant pain: Secondary | ICD-10-CM | POA: Diagnosis not present

## 2020-11-29 NOTE — Progress Notes (Signed)
Gynecology Annual Exam  PCP: Caryl Bis, MD  Chief Complaint:  Chief Complaint  Patient presents with  . Gynecologic Exam    Annual - Rt side back pain radiates around to ribs x 4 days. RM 5    History of Present Illness:Patient is a 49 y.o. G3P3 presents for annual exam. The patient has no complaints today.   LMP: Patient's last menstrual period was 03/13/2012. status post hysterectomy.  The patient is sexually active. She denies dyspareunia.  The patient does perform self breast exams.  There is no notable family history of breast or ovarian cancer in her family.  The patient wears seatbelts: yes.   The patient has regular exercise: not asked.    The patient denies current symptoms of depression.     She does report recent right upper quadrant pain that radiated into the back. Has had some mild nausea accompanying pain.  No fevers, no chills.  She is unable to correlate the pain to food intake or other inciting event.  Still has gallbladder.    Review of Systems: Review of Systems  Constitutional: Negative for chills and fever.  HENT: Negative for congestion.   Respiratory: Negative for cough and shortness of breath.   Cardiovascular: Negative for chest pain and palpitations.  Gastrointestinal: Positive for abdominal pain and nausea. Negative for constipation, diarrhea, heartburn and vomiting.  Genitourinary: Negative for dysuria, frequency and urgency.  Skin: Negative for itching and rash.  Neurological: Negative for dizziness and headaches.  Endo/Heme/Allergies: Negative for polydipsia.  Psychiatric/Behavioral: Negative for depression.    Past Medical History:  Patient Active Problem List   Diagnosis Date Noted  . Tobacco abuse 08/08/2016  . Chronic anal fissure s/p partial sphincterotomy 08/30/2016 08/08/2016  . Constipation, chronic 08/08/2016    Past Surgical History:  Past Surgical History:  Procedure Laterality Date  . ABDOMINAL HYSTERECTOMY    .  COLONOSCOPY    . SPHINCTEROTOMY N/A 08/30/2016   Procedure: LATERAL INTERNAL AND SPHINCTEROTOMY;  Surgeon: Michael Boston, MD;  Location: [facility];  Service: General;  Laterality: N/A;  . TONSILLECTOMY      Gynecologic History:  Patient's last menstrual period was 03/13/2012. Last Pap: Results were: N/A prior hysterectomy Last mammogram: 3/15/202021 Results were: BI-RAD I  Obstetric History: G3P3  Family History:  Family History  Problem Relation Age of Onset  . Breast cancer Paternal Aunt     Social History:  Social History   Socioeconomic History  . Marital status: Married    Spouse name: Not on file  . Number of children: Not on file  . Years of education: Not on file  . Highest education level: Not on file  Occupational History  . Not on file  Tobacco Use  . Smoking status: Current Every Day Smoker    Packs/day: 1.00    Years: 10.00    Pack years: 10.00    Types: Cigarettes    Last attempt to quit: 03/18/2012    Years since quitting: 8.7  . Smokeless tobacco: Never Used  Vaping Use  . Vaping Use: Never used  Substance and Sexual Activity  . Alcohol use: Yes    Comment: occ wine   . Drug use: No  . Sexual activity: Yes    Birth control/protection: Surgical    Comment: Hysterectomy  Other Topics Concern  . Not on file  Social History Narrative  . Not on file   Social Determinants of Health   Financial Resource Strain: Not on  file  Food Insecurity: Not on file  Transportation Needs: Not on file  Physical Activity: Not on file  Stress: Not on file  Social Connections: Not on file  Intimate Partner Violence: Not on file    Allergies:  Allergies  Allergen Reactions  . Clindamycin/Lincomycin Rash  . Sulfa Drugs Cross Reactors Itching and Rash    Medications: Prior to Admission medications   Medication Sig Start Date End Date Taking? Authorizing Provider  cetirizine (ZYRTEC) 10 MG tablet Take 10 mg by mouth daily as needed for allergies.   Yes [provider]  spironolactone (ALDACTONE) 50 MG tablet Take 50 mg by mouth daily. 08/28/18  Yes [provider]  hydrOXYzine (ATARAX/VISTARIL) 25 MG tablet Take 1 tablet (25 mg total) by mouth every 6 (six) hours as needed for anxiety. Patient not taking: Reported on 11/29/2020 11/13/18   [name], MD  Multiple Vitamin (MULTIVITAMIN WITH MINERALS) TABS tablet Take 1 tablet by mouth daily. Patient not taking: Reported on 11/29/2020    [provider]    Physical Exam Vitals: Blood pressure 126/82, height 5\' 7"  (1.702 m), weight 164 lb (74.4 kg), last menstrual period 03/13/2012.  General: NAD HEENT: normocephalic, anicteric Thyroid: no enlargement, no palpable nodules Pulmonary: No increased work of breathing, CTAB Cardiovascular: RRR, distal pulses 2+ Breast: Breast symmetrical, no tenderness, no palpable nodules or masses, no skin or nipple retraction present, no nipple discharge.  No axillary or supraclavicular lymphadenopathy. Abdomen: NABS, soft, non-tender, non-distended.  Umbilicus without lesions.  No hepatomegaly, splenomegaly or masses palpable. No evidence of hernia  Genitourinary:  External: Normal external female genitalia.  Normal urethral meatus, normal Bartholin's and Skene's glands.    Vagina: Normal vaginal mucosa, no evidence of prolapse.    Cervix: surgically absent  Uterus: surgically absent  Adnexa: ovaries non-enlarged, no adnexal masses  Rectal: deferred  Lymphatic: no evidence of inguinal lymphadenopathy Extremities: no edema, erythema, or tenderness Neurologic: Grossly intact Psychiatric: mood appropriate, affect full  Female chaperone present for pelvic and breast  portions of the physical exam  Immunization History  Administered Date(s) Administered  . Influenza,inj,Quad PF,6+ Mos 11/26/2018, 11/29/2020  . Janssen (J&J) SARS-COV-2 Vaccination 03/27/2020      Assessment: 49 y.o. G3P3 routine annual exam  Plan: Problem List  Items Addressed This Visit   None   Visit Diagnoses    Need for immunization against influenza    -  Primary   Relevant Orders   Flu Vaccine QUAD 36+ mos IM (Completed)   Encounter for gynecological examination without abnormal finding       Breast screening       Relevant Orders   MM 3D SCREEN BREAST BILATERAL   AIN grade III       Relevant Orders   Ambulatory referral to Gastroenterology   Colon cancer screening       Relevant Orders   Ambulatory referral to Gastroenterology   Right upper quadrant pain       Relevant Orders   Comprehensive metabolic panel   CBC   Lipase   US ABDOMEN LIMITED RUQ (LIVER/GB)      1) Mammogram - recommend yearly screening mammogram.  Mammogram Is up to date  2) STI screening  was notoffered and therefore not obtained  3) ASCCP guidelines and rational discussed.  Patient opts for discontinue secondary to prior hysterectomy screening interval  4) Osteoporosis  - per USPTF routine screening DEXA at age 3  5) Routine healthcare maintenance including cholesterol, diabetes  screening discussed managed by PCP  6) Colonoscopy - ordered secondary history AIN III s/p sphincterorotomy  7) RUQ pain - no related to po intake. Started in past week wraps around from back.  Will obtain CMP, CBC, lipase.  RUQ [place] ordered  7) Return in about 1 year (around 11/29/2021) for annual.    [name], MD [facility], [place] 11/29/2020, 10:17 AM

## 2020-11-29 NOTE — Patient Instructions (Signed)
[facility] [address] [place] [address]  [facility]  [address] [place] [address]  Phone: [phone]  

## 2020-11-30 LAB — LIPASE: Lipase: 34 U/L (ref 14–72)

## 2020-11-30 LAB — COMPREHENSIVE METABOLIC PANEL
ALT: 67 IU/L — ABNORMAL HIGH (ref 0–32)
AST: 39 IU/L (ref 0–40)
Albumin/Globulin Ratio: 2 (ref 1.2–2.2)
Albumin: 4.8 g/dL (ref 3.8–4.8)
Alkaline Phosphatase: 62 IU/L (ref 44–121)
BUN/Creatinine Ratio: 14 (ref 9–23)
BUN: 11 mg/dL (ref 6–24)
Bilirubin Total: 0.7 mg/dL (ref 0.0–1.2)
CO2: 20 mmol/L (ref 20–29)
Calcium: 9.6 mg/dL (ref 8.7–10.2)
Chloride: 99 mmol/L (ref 96–106)
Creatinine, Ser: 0.76 mg/dL (ref 0.57–1.00)
GFR calc Af Amer: 106 mL/min/{1.73_m2} (ref >59)
GFR calc non Af Amer: 92 mL/min/{1.73_m2} (ref >59)
Globulin, Total: 2.4 g/dL (ref 1.5–4.5)
Glucose: 104 mg/dL — ABNORMAL HIGH (ref 65–99)
Potassium: 4.2 mmol/L (ref 3.5–5.2)
Sodium: 136 mmol/L (ref 134–144)
Total Protein: 7.2 g/dL (ref 6.0–8.5)

## 2020-11-30 LAB — CBC
Hematocrit: 44.9 % (ref 34.0–46.6)
Hemoglobin: 15.9 g/dL (ref 11.1–15.9)
MCH: 33.9 pg — ABNORMAL HIGH (ref 26.6–33.0)
MCHC: 35.4 g/dL (ref 31.5–35.7)
MCV: 96 fL (ref 79–97)
Platelets: 192 10*3/uL (ref 150–450)
RBC: 4.69 x10E6/uL (ref 3.77–5.28)
RDW: 11.9 % (ref 11.7–15.4)
WBC: 10.7 10*3/uL (ref 3.4–10.8)

## 2020-12-03 ENCOUNTER — Other Ambulatory Visit: Payer: Self-pay

## 2020-12-03 ENCOUNTER — Ambulatory Visit
Admission: RE | Admit: 2020-12-03 | Discharge: 2020-12-03 | Disposition: A | Discharge status: Home / Self Care | Payer: BC Managed Care – PPO | Source: Ambulatory Visit | Attending: Obstetrics and Gynecology | Admitting: Obstetrics and Gynecology

## 2020-12-03 ENCOUNTER — Telehealth: Payer: BC Managed Care – PPO

## 2020-12-03 DIAGNOSIS — K760 Fatty (change of) liver, not elsewhere classified: Secondary | ICD-10-CM | POA: Diagnosis not present

## 2020-12-03 DIAGNOSIS — R1011 Right upper quadrant pain: Secondary | ICD-10-CM | POA: Diagnosis not present

## 2020-12-03 IMAGING — US US ABDOMEN LIMITED
1 series · 14 of 25 positions shown · non-contrast
Comparison: No pertinent prior exams available for comparison.

CLINICAL DATA: Right upper quadrant pain. Colicky right upper
quadrant pain, evaluate gallbladder and liver. Additional history
provided by scanning technologist: Patient reports right upper
quadrant pain for 1 week.

EXAM:
ULTRASOUND ABDOMEN LIMITED RIGHT UPPER QUADRANT

[Series 1: us abdomen limited ruq (liver/gb) · 14 of 42 slices shown]
[im 1/42]
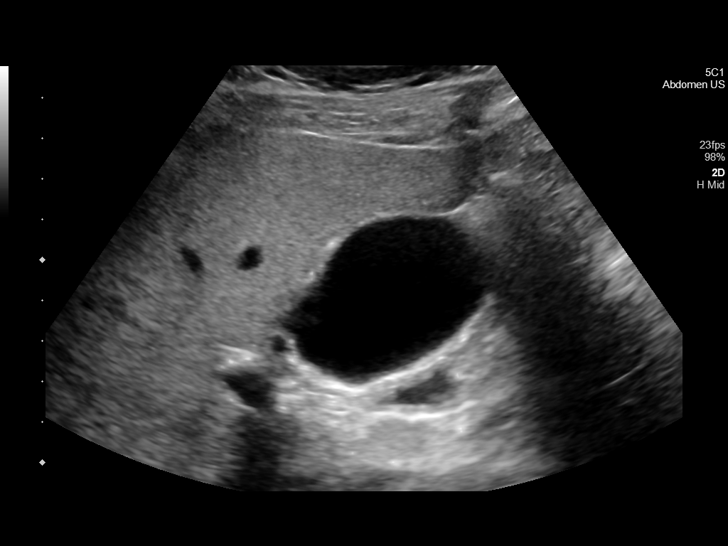
[im 4/42]
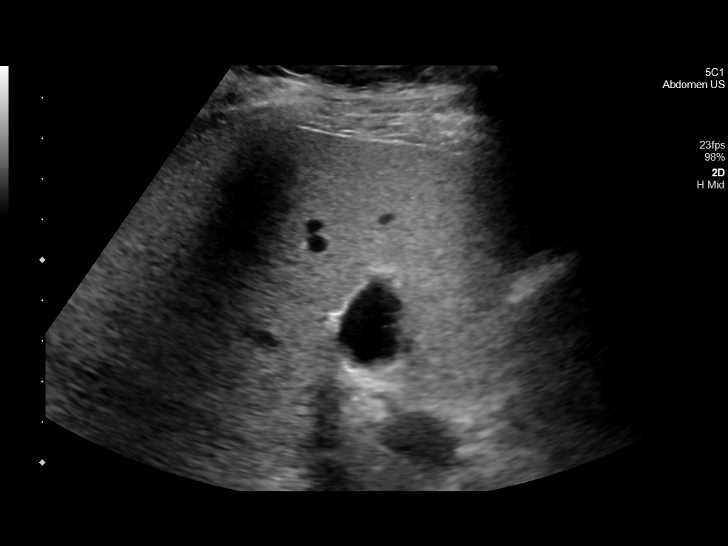
[im 7/42]
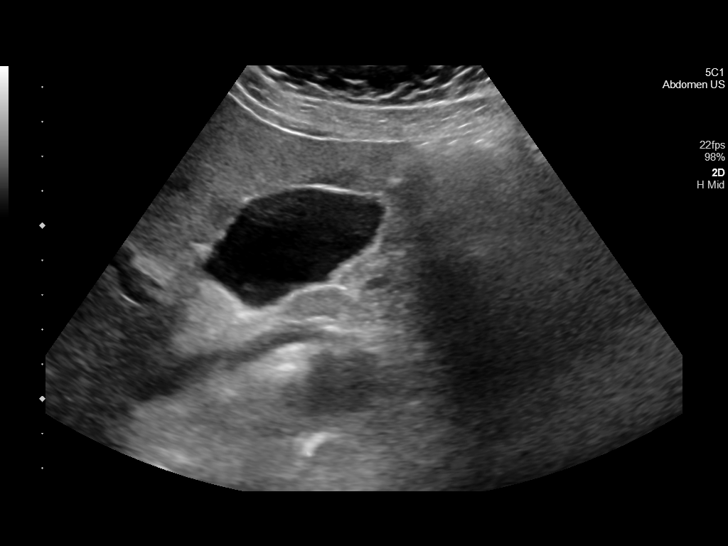
[im 11/42]
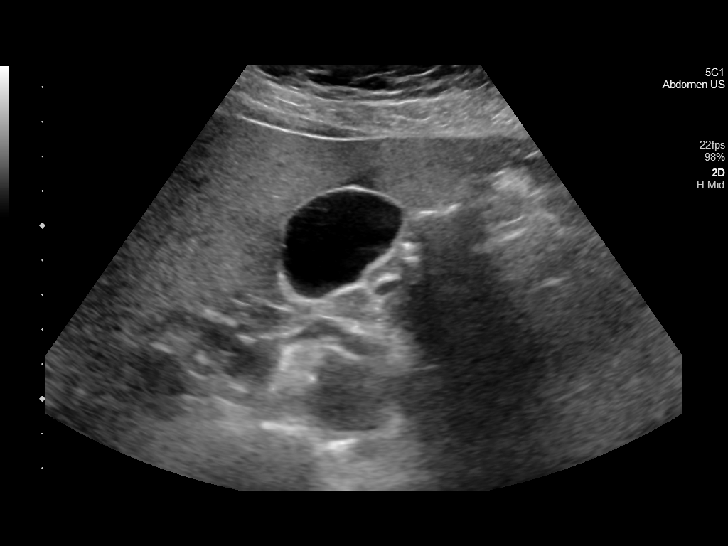
[im 14/42]
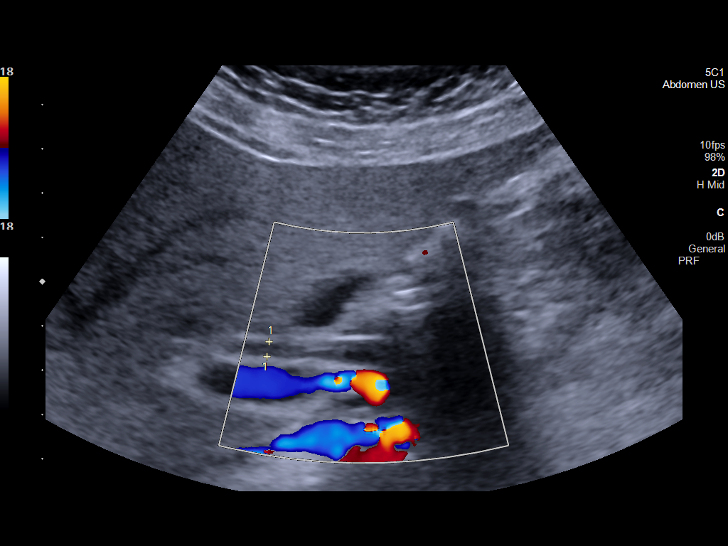
[im 16/42]
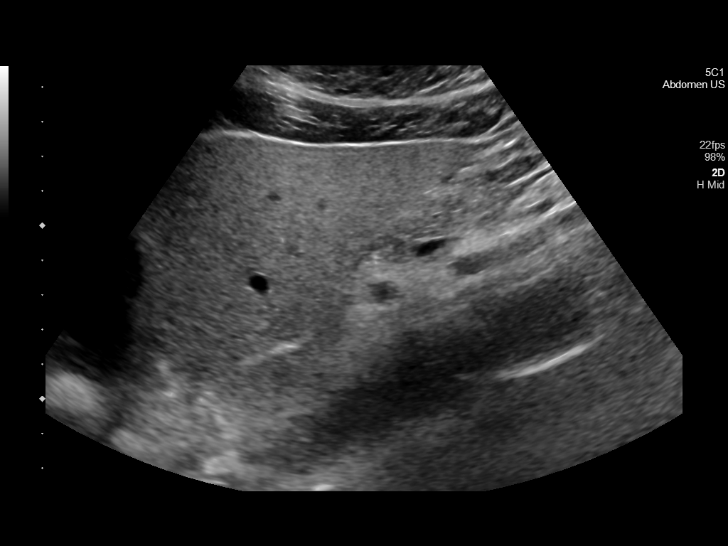
[im 19/42]
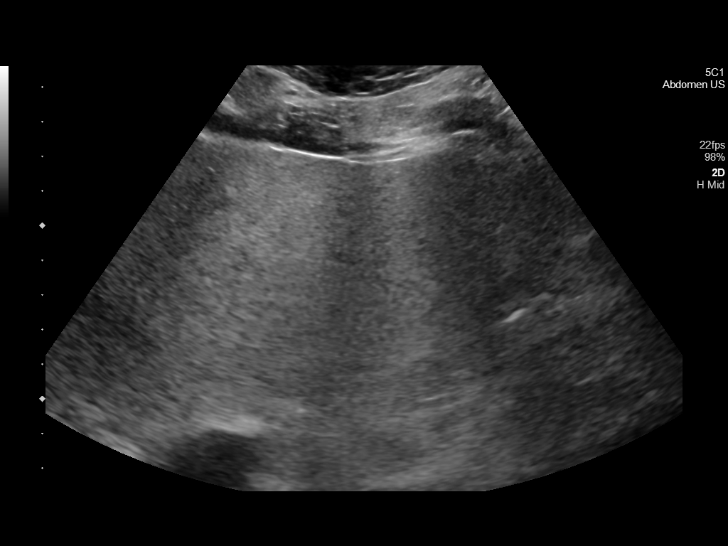
[im 23/42]
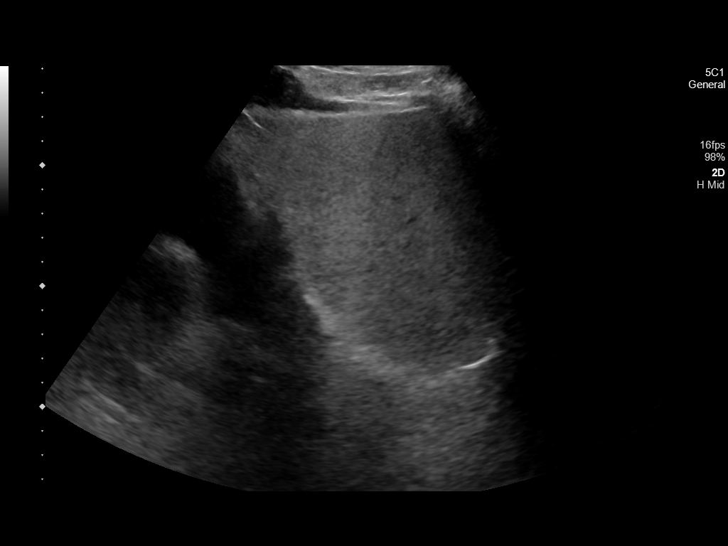
[im 26/42]
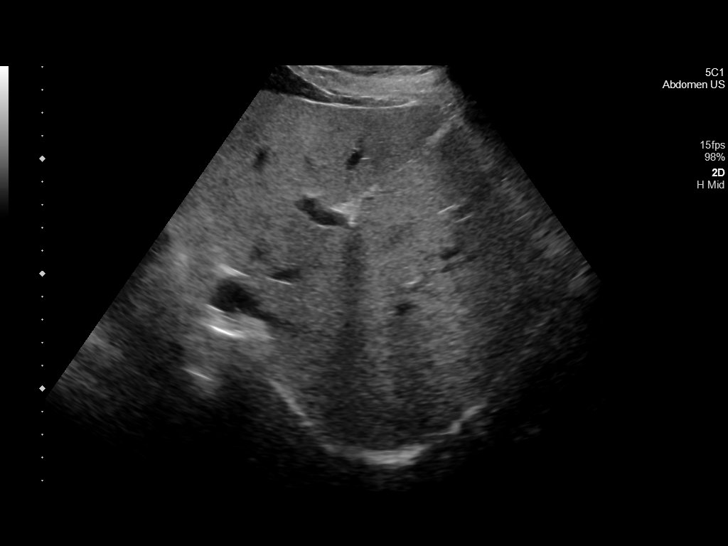
[im 28/42]
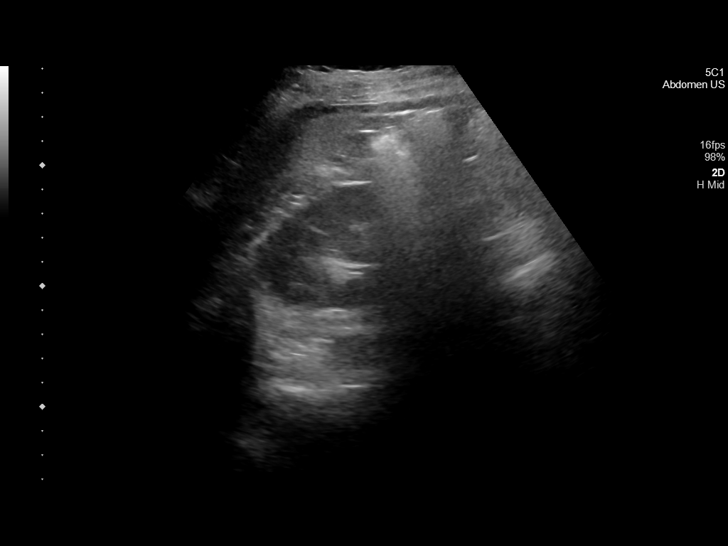
[im 31/42]
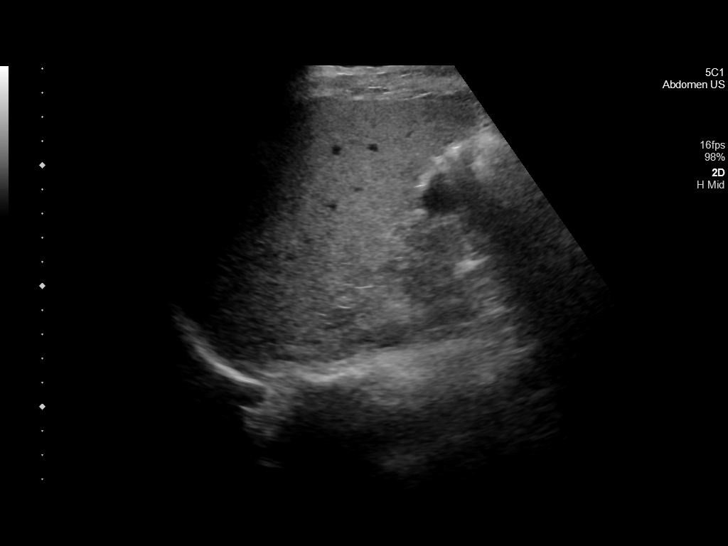
[im 35/42]
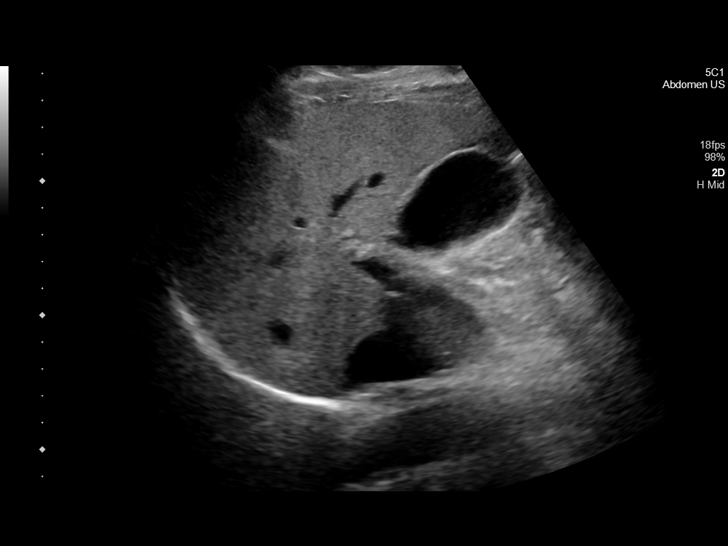
[im 38/42]
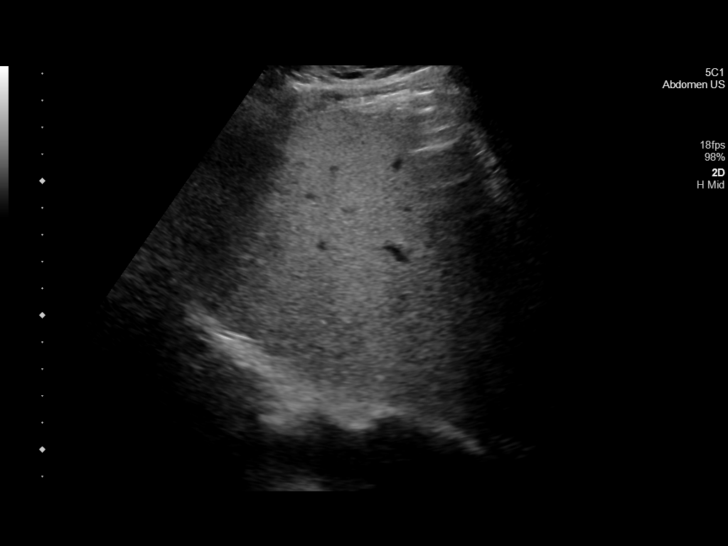
[im 42/42]
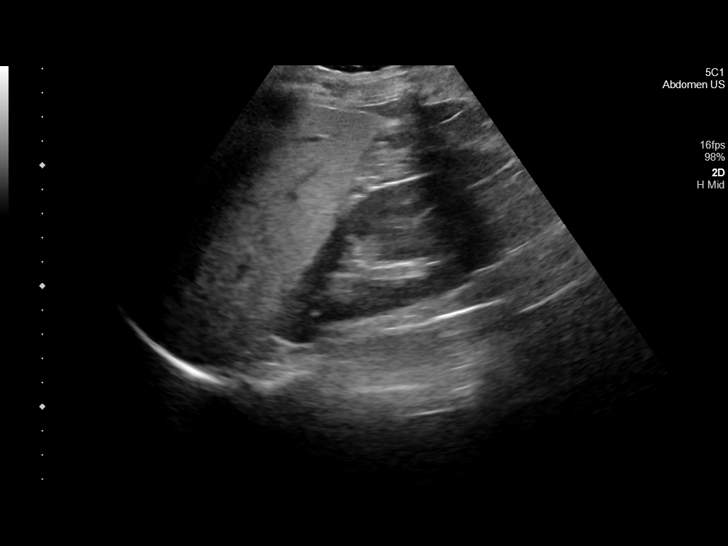

[14 of 25 positions shown; findings below may reference images not displayed]

FINDINGS: Gallbladder:

No gallstones or wall thickening visualized. No sonographic Murphy
sign noted by sonographer.

Common bile duct:

Diameter: 3-4 mm, within normal limits.

Liver:

No focal lesion identified. Increased hepatic parenchymal
echogenicity. Portal vein is patent on color Doppler imaging with
normal direction of blood flow towards the liver.
IMPRESSION: Hyperechogenicity of the hepatic parenchyma. This is a nonspecific
finding, which may be seen in the setting of hepatic steatosis or
other chronic hepatic parenchymal disease.

Otherwise unremarkable right upper quadrant ultrasound, as
described.

## 2020-12-04 ENCOUNTER — Other Ambulatory Visit: Payer: Self-pay | Admitting: Obstetrics and Gynecology

## 2020-12-04 DIAGNOSIS — K8050 Calculus of bile duct without cholangitis or cholecystitis without obstruction: Secondary | ICD-10-CM

## 2020-12-04 NOTE — Progress Notes (Signed)
b

## 2020-12-14 ENCOUNTER — Other Ambulatory Visit: Payer: Self-pay | Admitting: Obstetrics and Gynecology

## 2020-12-14 MED ORDER — VALACYCLOVIR HCL 1 G PO TABS: 1000.0000 mg | ORAL_TABLET | Freq: Every day | ORAL | 6 refills | Status: AC

## 2020-12-15 ENCOUNTER — Telehealth (INDEPENDENT_AMBULATORY_CARE_PROVIDER_SITE_OTHER): Payer: Self-pay | Admitting: Gastroenterology

## 2020-12-15 ENCOUNTER — Other Ambulatory Visit: Payer: Self-pay

## 2020-12-15 DIAGNOSIS — D013 Carcinoma in situ of anus and anal canal: Secondary | ICD-10-CM

## 2020-12-15 DIAGNOSIS — Z1211 Encounter for screening for malignant neoplasm of colon: Secondary | ICD-10-CM

## 2020-12-15 MED ORDER — NA SULFATE-K SULFATE-MG SULF 17.5-3.13-1.6 GM/177ML PO SOLN: 1.0000 | Freq: Once | ORAL | 0 refills | Status: AC

## 2020-12-15 NOTE — Progress Notes (Signed)
Gastroenterology Pre-Procedure Review  Request Date: Friday 01/28/21 Requesting Physician: Dr. Bonna Gains  PATIENT REVIEW QUESTIONS: The patient responded to the following health history questions as indicated:    1. Are you having any GI issues? no 2. Do you have a personal history of Polyps? no 3. Do you have a family history of Colon Cancer or Polyps? no 4. Diabetes Mellitus? no 5. Joint replacements in the past 12 months?no 6. Major health problems in the past 3 months?no 7. Any artificial heart valves, MVP, or defibrillator?no    MEDICATIONS & ALLERGIES:    Patient reports the following regarding taking any anticoagulation/antiplatelet therapy:   Plavix, Coumadin, Eliquis, Xarelto, Lovenox, Pradaxa, Brilinta, or Effient? no Aspirin? no  Patient confirms/reports the following medications:  Current Outpatient Medications  Medication Sig Dispense Refill  . cetirizine (ZYRTEC) 10 MG tablet Take 10 mg by mouth daily as needed for allergies.    . Na Sulfate-K Sulfate-Mg Sulf 17.5-3.13-1.6 GM/177ML SOLN Take 1 kit by mouth once for 1 dose. 354 mL 0  . spironolactone (ALDACTONE) 50 MG tablet Take 50 mg by mouth daily.  5  . valACYclovir (VALTREX) 1000 MG tablet Take 1 tablet (1,000 mg total) by mouth daily for 5 days. 5 tablet 6  . hydrOXYzine (ATARAX/VISTARIL) 25 MG tablet Take 1 tablet (25 mg total) by mouth every 6 (six) hours as needed for anxiety. (Patient not taking: No sig reported) 30 tablet 2  . Multiple Vitamin (MULTIVITAMIN WITH MINERALS) TABS tablet Take 1 tablet by mouth daily. (Patient not taking: No sig reported)     No current facility-administered medications for this visit.    Patient confirms/reports the following allergies:  Allergies  Allergen Reactions  . Clindamycin/Lincomycin Rash  . Sulfa Drugs Cross Reactors Itching and Rash    Orders Placed This Encounter  Procedures  . Procedural/ Surgical Case Request: COLONOSCOPY WITH PROPOFOL    Standing Status:    Standing    Number of Occurrences:   1    Order Specific Question:   Pre-op diagnosis    Answer:   screening colonoscopy    Order Specific Question:   CPT Code    Answer:   62035    AUTHORIZATION INFORMATION Primary Insurance: 1D#: Group #:  Secondary Insurance: 1D#: Group #:  SCHEDULE INFORMATION: Date: 01/28/21 Time: Location:[facility]

## 2021-01-11 ENCOUNTER — Telehealth: Payer: Self-pay

## 2021-01-11 NOTE — Telephone Encounter (Signed)
Patient had the following concerns regarding her colonoscopy:  1.  She was told that we are not in network with her insurance. I asked her to contact Pre-Service Dept to see if this was correct because referral stated [facility].  2. If she tested positive for COVID would she be charged the cancellation fee.  Patient has been informed that she will not be charged a fee.  3.COVID test results aren't received in time.  Patient has been advised that we have a two day turn around time that ensures that we get results in time to notify patient should they test positive.  If positive she will receive a call from our office to notify of this.  Patient appreciated my call back to address her questions.  I asked her to call back if she has any other concerns.  Thanks,  [place], [place]

## 2021-01-26 ENCOUNTER — Other Ambulatory Visit
Admission: RE | Admit: 2021-01-26 | Discharge: 2021-01-26 | Disposition: A | Discharge status: Home / Self Care | Payer: BC Managed Care – PPO | Source: Ambulatory Visit | Attending: Gastroenterology | Admitting: Gastroenterology

## 2021-01-26 ENCOUNTER — Other Ambulatory Visit: Payer: Self-pay

## 2021-01-26 DIAGNOSIS — K635 Polyp of colon: Secondary | ICD-10-CM | POA: Diagnosis not present

## 2021-01-26 DIAGNOSIS — R85613 High grade squamous intraepithelial lesion on cytologic smear of anus (HGSIL): Secondary | ICD-10-CM | POA: Diagnosis not present

## 2021-01-26 DIAGNOSIS — Z20822 Contact with and (suspected) exposure to covid-19: Secondary | ICD-10-CM | POA: Insufficient documentation

## 2021-01-26 DIAGNOSIS — Z1211 Encounter for screening for malignant neoplasm of colon: Secondary | ICD-10-CM | POA: Diagnosis not present

## 2021-01-26 DIAGNOSIS — F17210 Nicotine dependence, cigarettes, uncomplicated: Secondary | ICD-10-CM | POA: Diagnosis not present

## 2021-01-26 DIAGNOSIS — Z01812 Encounter for preprocedural laboratory examination: Secondary | ICD-10-CM | POA: Insufficient documentation

## 2021-01-26 DIAGNOSIS — Z79899 Other long term (current) drug therapy: Secondary | ICD-10-CM | POA: Diagnosis not present

## 2021-01-26 LAB — SARS CORONAVIRUS 2 (TAT 6-24 HRS): SARS Coronavirus 2: NEGATIVE

## 2021-01-27 ENCOUNTER — Encounter: Payer: Self-pay | Admitting: Gastroenterology

## 2021-01-28 ENCOUNTER — Telehealth: Payer: Self-pay | Admitting: Gastroenterology

## 2021-01-28 ENCOUNTER — Ambulatory Visit: Payer: BC Managed Care – PPO | Admitting: Anesthesiology

## 2021-01-28 ENCOUNTER — Ambulatory Visit
Admission: RE | Admit: 2021-01-28 | Discharge: 2021-01-28 | Disposition: A | Discharge status: Home / Self Care | Payer: BC Managed Care – PPO | Attending: Gastroenterology | Admitting: Gastroenterology

## 2021-01-28 ENCOUNTER — Encounter: Payer: Self-pay | Admitting: Gastroenterology

## 2021-01-28 ENCOUNTER — Encounter
Admission: RE | Disposition: A | Discharge status: Home / Self Care | Payer: Self-pay | Source: Home / Self Care | Attending: Gastroenterology

## 2021-01-28 DIAGNOSIS — K621 Rectal polyp: Secondary | ICD-10-CM | POA: Diagnosis not present

## 2021-01-28 DIAGNOSIS — F17210 Nicotine dependence, cigarettes, uncomplicated: Secondary | ICD-10-CM | POA: Insufficient documentation

## 2021-01-28 DIAGNOSIS — R85613 High grade squamous intraepithelial lesion on cytologic smear of anus (HGSIL): Secondary | ICD-10-CM | POA: Diagnosis not present

## 2021-01-28 DIAGNOSIS — Z20822 Contact with and (suspected) exposure to covid-19: Secondary | ICD-10-CM | POA: Insufficient documentation

## 2021-01-28 DIAGNOSIS — K629 Disease of anus and rectum, unspecified: Secondary | ICD-10-CM | POA: Diagnosis not present

## 2021-01-28 DIAGNOSIS — Z1211 Encounter for screening for malignant neoplasm of colon: Secondary | ICD-10-CM | POA: Diagnosis not present

## 2021-01-28 DIAGNOSIS — K6289 Other specified diseases of anus and rectum: Secondary | ICD-10-CM | POA: Diagnosis not present

## 2021-01-28 DIAGNOSIS — K635 Polyp of colon: Secondary | ICD-10-CM

## 2021-01-28 DIAGNOSIS — Z79899 Other long term (current) drug therapy: Secondary | ICD-10-CM | POA: Insufficient documentation

## 2021-01-28 DIAGNOSIS — K649 Unspecified hemorrhoids: Secondary | ICD-10-CM | POA: Diagnosis not present

## 2021-01-28 HISTORY — PX: COLONOSCOPY WITH PROPOFOL: SHX5780

## 2021-01-28 SURGERY — COLONOSCOPY WITH PROPOFOL
Anesthesia: General

## 2021-01-28 MED ORDER — LIDOCAINE HCL (PF) 2 % IJ SOLN
INTRAMUSCULAR | Status: AC
  Filled 2021-01-28: qty 5

## 2021-01-28 MED ORDER — PROPOFOL 500 MG/50ML IV EMUL
INTRAVENOUS | Status: AC
  Filled 2021-01-28: qty 50

## 2021-01-28 MED ORDER — SODIUM CHLORIDE 0.9 % IV SOLN
INTRAVENOUS | Status: DC
  Administered 2021-01-28: 20 mL/h via INTRAVENOUS

## 2021-01-28 MED ORDER — PROPOFOL 500 MG/50ML IV EMUL
INTRAVENOUS | Status: DC | PRN
  Administered 2021-01-28: 150 ug/kg/min via INTRAVENOUS

## 2021-01-28 MED ORDER — PROPOFOL 10 MG/ML IV BOLUS
INTRAVENOUS | Status: DC | PRN
  Administered 2021-01-28: 90 mg via INTRAVENOUS
  Administered 2021-01-28: 10 mg via INTRAVENOUS

## 2021-01-28 MED ORDER — LIDOCAINE HCL (CARDIAC) PF 100 MG/5ML IV SOSY
PREFILLED_SYRINGE | INTRAVENOUS | Status: DC | PRN
  Administered 2021-01-28: 40 mg via INTRAVENOUS

## 2021-01-28 NOTE — Op Note (Signed)
[facility] Gastroenterology Patient Name: Beth [name] Procedure Date: 01/28/2021 8:01 AM MRN: [id number] Account #: [id number] Date of Birth: Jun 26, 1971 Admit Type: Outpatient Age: 50 Room: [facility] ENDO ROOM 2 Gender: Female Note Status: Finalized Procedure:             Colonoscopy Indications:           Screening for colorectal malignant neoplasm Providers:             Moshe Wenger B. Bonna Gains MD, MD Referring MD:          Mitzie Na. Quillian Quince MD, MD (Referring MD) Medicines:             Monitored Anesthesia Care Complications:         No immediate complications. Procedure:             Pre-Anesthesia Assessment:                        - ASA Grade Assessment: II - A patient with mild                         systemic disease.                        - Prior to the procedure, a History and Physical was                         performed, and patient medications, allergies and                         sensitivities were reviewed. The patient's tolerance                         of previous anesthesia was reviewed.                        - The risks and benefits of the procedure and the                         sedation options and risks were discussed with the                         patient. All questions were answered and informed                         consent was obtained.                        - Patient identification and proposed procedure were                         verified prior to the procedure by the physician, the                         nurse, the anesthesiologist, the anesthetist and the                         technician. The procedure was verified in the  procedure room.                        After obtaining informed consent, the colonoscope was                         passed under direct vision. Throughout the procedure,                         the patient's blood pressure, pulse, and oxygen                         saturations were  monitored continuously. The                         Colonoscope was introduced through the anus and                         advanced to the the cecum, identified by appendiceal                         orifice and ileocecal valve. The colonoscopy was                         performed with ease. The patient tolerated the                         procedure well. The quality of the bowel preparation                         was good. Findings:      The perianal exam findings include lesion from the rectum extending       outside and with small amount of blood oozing.      Two sessile polyps were found in the sigmoid colon. The polyps were 4 mm       in size. These polyps were removed with a cold snare. Resection and       retrieval were complete.      The exam was otherwise without abnormality.      The sigmoid colon, descending colon, transverse colon, ascending colon       and cecum appeared normal.      An ulcerated non-obstructing medium-sized mass was found in the rectum.       Biopsies were taken with a cold forceps for histology. Only 2 small       biopsies were taken at the top of the lesion, toward the most proximal       end, to avoid biopsy below the anal verge. Impression:            - Lesion from the rectum extending outside and with                         small amount of blood oozing. found on perianal exam.                        - Two 4 mm polyps in the sigmoid colon, removed with a                         cold snare. Resected  and retrieved.                        - The examination was otherwise normal.                        - The sigmoid colon, descending colon, transverse                         colon, ascending colon and cecum are normal.                        - Rule out malignancy, tumor in the rectum. Biopsied. Recommendation:        - Discharge patient to home (with escort).                        - Advance diet as tolerated.                        - Continue present  medications.                        - Await pathology results.                        - Repeat colonoscopy date to be determined after                         pending pathology results are reviewed.                        - The findings and recommendations were discussed with                         the patient.                        - The findings and recommendations were discussed with                         the patient's family.                        - Return to primary care physician as previously                         scheduled.                        - Refer to [facility] for rectal lesion at the                         next available appointment.                        - Return to GI clinic in 2 weeks. Procedure Code(s):     --- Professional ---                        [phone], Colonoscopy, flexible; with removal of  tumor(s), polyp(s), or other lesion(s) by snare                         technique                        45380, 44, Colonoscopy, flexible; with biopsy, single                         or multiple Diagnosis Code(s):     --- Professional ---                        Z12.11, Encounter for screening for malignant neoplasm                         of colon                        K63.5, Polyp of colon CPT copyright 2019 American Medical Association. All rights reserved. The codes documented in this report are preliminary and upon coder review may  be revised to meet current compliance requirements.  Vonda Antigua, MD Margretta Sidle B. Bonna Gains MD, MD 01/28/2021 8:52:42 AM This report has been signed electronically. Number of Addenda: 0 Note Initiated On: 01/28/2021 8:01 AM Scope Withdrawal Time: 0 hours 22 minutes 0 seconds  Total Procedure Duration: 0 hours 31 minutes 33 seconds  Estimated Blood Loss:  Estimated blood loss: none.      [facility]

## 2021-01-28 NOTE — Transfer of Care (Signed)
Immediate Anesthesia Transfer of Care Note  Patient: Beth [name]  Procedure(s) Performed: Procedure(s): COLONOSCOPY WITH PROPOFOL (N/A)  Patient Location: PACU and Endoscopy Unit  Anesthesia Type:General  Level of Consciousness: sedated  Airway & Oxygen Therapy: Patient Spontanous Breathing and Patient connected to nasal cannula oxygen  Post-op Assessment: Report given to RN and Post -op Vital signs reviewed and stable  Post vital signs: Reviewed and stable  Last Vitals:  Vitals:   01/28/21 0738 01/28/21 0852  BP: (!) 133/99 117/83  Pulse: 93 80  Resp: 20 (!) 21  Temp: (!) 36.3 C   SpO2: 62% 563%    Complications: No apparent anesthesia complications

## 2021-01-28 NOTE — Anesthesia Procedure Notes (Signed)
Performed by: Hulda Reddix, CRNA Pre-anesthesia Checklist: Patient identified, Emergency Drugs available, Suction available and Patient being monitored Patient Re-evaluated:Patient Re-evaluated prior to induction Oxygen Delivery Method: Nasal cannula Induction Type: IV induction Dental Injury: Teeth and Oropharynx as per pre-operative assessment  Comments: Nasal cannula with etCO2 monitoring       

## 2021-01-28 NOTE — Telephone Encounter (Signed)
Called and LM on VM with appointment time and date per Dr Bonna Gains.  Advised patient to call back  if she had any questions

## 2021-01-28 NOTE — H&P (Signed)
Vonda Antigua, MD [address] [place], [place], [place], [address] [address], [place], [place], [place], [address] Phone: [phone]  Fax: [phone]  Primary Care Physician:  Caryl Bis, MD   Pre-Procedure History & Physical: HPI:  Beth [name] is a 50 y.o. female is here for a colonoscopy.   Past Medical History:  Diagnosis Date  . Constipation, chronic 08/08/2016  . Headache    migraines     Past Surgical History:  Procedure Laterality Date  . ABDOMINAL HYSTERECTOMY    . COLONOSCOPY    . SPHINCTEROTOMY N/A 08/30/2016   Procedure: LATERAL INTERNAL AND SPHINCTEROTOMY;  Surgeon: Michael Boston, MD;  Location: [facility] ORS;  Service: General;  Laterality: N/A;  . TONSILLECTOMY      Prior to Admission medications   Medication Sig Start Date End Date Taking? Authorizing Provider  cetirizine (ZYRTEC) 10 MG tablet Take 10 mg by mouth daily as needed for allergies.   Yes [provider]  hydrOXYzine (ATARAX/VISTARIL) 25 MG tablet Take 1 tablet (25 mg total) by mouth every 6 (six) hours as needed for anxiety. 11/13/18  Yes [name], MD  spironolactone (ALDACTONE) 50 MG tablet Take 50 mg by mouth daily. 08/28/18  Yes [provider]  Multiple Vitamin (MULTIVITAMIN WITH MINERALS) TABS tablet Take 1 tablet by mouth daily. Patient not taking: No sig reported    [provider]    Allergies as of 12/15/2020 - Review Complete 12/15/2020  Allergen Reaction Noted  . Clindamycin/lincomycin Rash 11/21/2018  . Sulfa drugs cross reactors Itching and Rash 04/03/2012    Family History  Problem Relation Age of Onset  . Breast cancer Paternal Aunt     Social History   Socioeconomic History  . Marital status: Married    Spouse name: Not on file  . Number of children: Not on file  . Years of education: Not on file  . Highest education level: Not on file  Occupational History  . Not on file  Tobacco Use  . Smoking status: Current  Every Day Smoker    Packs/day: 1.00    Years: 10.00    Pack years: 10.00    Types: Cigarettes    Last attempt to quit: 03/18/2012    Years since quitting: 8.8  . Smokeless tobacco: Never Used  Vaping Use  . Vaping Use: Never used  Substance and Sexual Activity  . Alcohol use: Yes    Comment: occ wine   . Drug use: No  . Sexual activity: Yes    Birth control/protection: Surgical    Comment: Hysterectomy  Other Topics Concern  . Not on file  Social History Narrative  . Not on file   Social Determinants of Health   Financial Resource Strain: Not on file  Food Insecurity: Not on file  Transportation Needs: Not on file  Physical Activity: Not on file  Stress: Not on file  Social Connections: Not on file  Intimate Partner Violence: Not on file    Review of Systems: See HPI, otherwise negative ROS  Physical Exam: BP (!) 133/99   Pulse 93   Temp (!) 97.3 F (36.3 C) (Temporal)   Resp 20   Ht 5\' 7"  (1.702 m)   Wt 73.5 kg   LMP 03/13/2012   SpO2 99%   BMI 25.37 kg/m  General:   Alert,  pleasant and cooperative in NAD Head:  Normocephalic and atraumatic. Neck:  Supple; no masses or thyromegaly. Lungs:  Clear throughout to auscultation,  normal respiratory effort.    Heart:  +S1, +S2, Regular rate and rhythm, No edema. Abdomen:  Soft, nontender and nondistended. Normal bowel sounds, without guarding, and without rebound.   Neurologic:  Alert and  oriented x4;  grossly normal neurologically.  Impression/Plan: Beth [name] is here for a colonoscopy to be performed for average risk screening.  Risks, benefits, limitations, and alternatives regarding  colonoscopy have been reviewed with the patient.  Questions have been answered.  All parties agreeable.   Virgel Manifold, MD  01/28/2021, 8:00 AM

## 2021-01-28 NOTE — Anesthesia Preprocedure Evaluation (Signed)
Anesthesia Evaluation  Patient identified by MRN, date of birth, ID band Patient awake    Reviewed: Allergy & Precautions, NPO status , Patient's Chart, lab work & pertinent test results  History of Anesthesia Complications Negative for: history of anesthetic complications  Airway Mallampati: II  TM Distance: >3 FB Neck ROM: Full    Dental no notable dental hx. (+) Teeth Intact   Pulmonary neg sleep apnea, neg COPD, Current SmokerPatient did not abstain from smoking.,    Pulmonary exam normal breath sounds clear to auscultation       Cardiovascular Exercise Tolerance: Good METS(-) hypertension(-) CAD and (-) Past MI negative cardio ROS Normal cardiovascular exam(-) dysrhythmias  Rhythm:Regular Rate:Normal - Systolic murmurs    Neuro/Psych  Headaches, negative psych ROS   GI/Hepatic neg GERD  ,(+)     (-) substance abuse  ,   Endo/Other  neg diabetes  Renal/GU negative Renal ROS     Musculoskeletal   Abdominal   Peds  Hematology   Anesthesia Other Findings Past Medical History: 08/08/2016: Constipation, chronic No date: Headache     Comment:  migraines   Reproductive/Obstetrics                             Anesthesia Physical  Anesthesia Plan  ASA: II  Anesthesia Plan: General   Post-op Pain Management:    Induction: Intravenous  PONV Risk Score and Plan: 2 and Ondansetron, Propofol infusion and TIVA  Airway Management Planned: Natural Airway  Additional Equipment: None  Intra-op Plan:   Post-operative Plan: Extubation in OR  Informed Consent: I have reviewed the patients History and Physical, chart, labs and discussed the procedure including the risks, benefits and alternatives for the proposed anesthesia with the patient or authorized representative who has indicated his/her understanding and acceptance.     Dental advisory given  Plan Discussed with: CRNA and  Surgeon  Anesthesia Plan Comments: (Discussed risks of anesthesia with patient, including possibility of difficulty with spontaneous ventilation under anesthesia necessitating airway intervention, PONV, and rare risks such as cardiac or respiratory or neurological events. Patient understands. Patient counseled on benefits of smoking cessation, and increased perioperative risks associated with continued smoking. )        Anesthesia Quick Evaluation

## 2021-01-28 NOTE — Anesthesia Postprocedure Evaluation (Signed)
Anesthesia Post Note  Patient: Beth [name]  Procedure(s) Performed: COLONOSCOPY WITH PROPOFOL (N/A )  Patient location during evaluation: Endoscopy Anesthesia Type: General Level of consciousness: awake and alert Pain management: pain level controlled Vital Signs Assessment: post-procedure vital signs reviewed and stable Respiratory status: spontaneous breathing, nonlabored ventilation, respiratory function stable and patient connected to nasal cannula oxygen Cardiovascular status: blood pressure returned to baseline and stable Postop Assessment: no apparent nausea or vomiting Anesthetic complications: no   No complications documented.   Last Vitals:  Vitals:   01/28/21 0900 01/28/21 0910  BP: 128/88 128/83  Pulse: 75 76  Resp: 20 12  Temp:    SpO2: 100% 100%    Last Pain:  Vitals:   01/28/21 0738  TempSrc: Temporal  PainSc: 0-No pain                 Arita Miss

## 2021-01-31 LAB — SURGICAL PATHOLOGY

## 2021-02-01 ENCOUNTER — Telehealth: Payer: Self-pay | Admitting: Gastroenterology

## 2021-02-01 NOTE — Telephone Encounter (Signed)
Patient called and asked for call back to discuss results from her Colonoscopy.

## 2021-02-02 ENCOUNTER — Telehealth (INDEPENDENT_AMBULATORY_CARE_PROVIDER_SITE_OTHER): Payer: BC Managed Care – PPO | Admitting: Gastroenterology

## 2021-02-02 DIAGNOSIS — R85613 High grade squamous intraepithelial lesion on cytologic smear of anus (HGSIL): Secondary | ICD-10-CM | POA: Diagnosis not present

## 2021-02-02 DIAGNOSIS — K760 Fatty (change of) liver, not elsewhere classified: Secondary | ICD-10-CM

## 2021-02-02 DIAGNOSIS — K629 Disease of anus and rectum, unspecified: Secondary | ICD-10-CM

## 2021-02-02 NOTE — Telephone Encounter (Signed)
Called patient and she reports she already spoke with someone.

## 2021-02-02 NOTE — Addendum Note (Signed)
Addended by: Wayna Chalet on: 02/02/2021 01:41 PM   Modules accepted: Orders

## 2021-02-02 NOTE — Telephone Encounter (Signed)
Patient's was seen by MyChart today by Dr. Bonna Gains. Patient was referred to [facility]. They will contact the patient to schedule her consult.

## 2021-02-02 NOTE — Progress Notes (Addendum)
Beth [name] [address]  [place]  [place], [place] [address]  Main: [phone]  Fax: [phone]   Gastroenterology Consultation  Referring Provider:     Caryl Bis, MD Primary Care Physician:  Beth Bis, MD Reason for Consultation:     Rectal lesion        HPI:   Virtual Visit via Video Note  I connected with patient on 02/02/21 at  1:15 PM EST by video and verified that I am speaking with the correct person using two identifiers.   I discussed the limitations, risks, security and privacy concerns of performing an evaluation and management service by video and the availability of in person appointments. I also discussed with the patient that there may be a patient responsible charge related to this service. The patient expressed understanding and agreed to proceed.  Location of the patient: Home Location of provider: Home Participating persons: Patient and provider only (Nursing staff checked in patient via phone but were not physically involved in the video interaction - see their notes)   History of Present Illness: Chief complaint: Rectal lesion  Beth [name] is a 50 y.o. y/o female referred for consultation & management  by Dr. Caryl Bis, MD.  Patient was seen for screening colonoscopy last week.  She denied any symptoms.  Colonoscopy showed a rectal lesion extending below the anal verge.  Other findings included two 4 mm polyps in the sigmoid colon that were removed.  The polyps were hyperplastic.  The rectal lesion showed "high-grade squamous intraepithelial lesion (AIN 3/carcinoma in situ) at least, cannot exclude invasive squamous cell carcinoma."  I do not see any previous results for HIV testing.  Patient states she may have had history of vaginal HPV in her 70s but is not sure and does not recall any specific treatment being done for that.  Denies any bright red blood per rectum    Past Medical History:  Diagnosis Date  .  Constipation, chronic 08/08/2016  . Headache    migraines     Past Surgical History:  Procedure Laterality Date  . ABDOMINAL HYSTERECTOMY    . COLONOSCOPY    . COLONOSCOPY WITH PROPOFOL N/A 01/28/2021   Procedure: COLONOSCOPY WITH PROPOFOL;  Surgeon: Beth Manifold, MD;  Location: [facility] ENDOSCOPY;  Service: Endoscopy;  Laterality: N/A;  . SPHINCTEROTOMY N/A 08/30/2016   Procedure: LATERAL INTERNAL AND SPHINCTEROTOMY;  Surgeon: Beth Boston, MD;  Location: [facility] ORS;  Service: General;  Laterality: N/A;  . TONSILLECTOMY      Prior to Admission medications   Medication Sig Start Date End Date Taking? Authorizing Provider  cetirizine (ZYRTEC) 10 MG tablet Take 10 mg by mouth daily as needed for allergies.    [provider]  hydrOXYzine (ATARAX/VISTARIL) 25 MG tablet Take 1 tablet (25 mg total) by mouth every 6 (six) hours as needed for anxiety. 11/13/18   Beth Mood, MD  Multiple Vitamin (MULTIVITAMIN WITH MINERALS) TABS tablet Take 1 tablet by mouth daily. Patient not taking: No sig reported    [provider]  spironolactone (ALDACTONE) 50 MG tablet Take 50 mg by mouth daily. 08/28/18   [provider]    Family History  Problem Relation Age of Onset  . Breast cancer Paternal Aunt      Social History   Tobacco Use  . Smoking status: Current Every Day Smoker    Packs/day: 1.00    Years: 10.00    Pack years: 10.00  Types: Cigarettes    Last attempt to quit: 03/18/2012    Years since quitting: 8.8  . Smokeless tobacco: Never Used  Vaping Use  . Vaping Use: Never used  Substance Use Topics  . Alcohol use: Yes    Comment: occ wine   . Drug use: No    Allergies as of 02/02/2021 - Review Complete 01/28/2021  Allergen Reaction Noted  . Clindamycin/lincomycin Rash 11/21/2018  . Sulfa drugs cross reactors Itching and Rash 04/03/2012    Review of Systems:    All systems reviewed and negative except where noted in  HPI.   Observations/Objective:  Labs: CBC    Component Value Date/Time   WBC 10.7 11/29/2020 1022   WBC 6.7 10/07/2018 0808   RBC 4.69 11/29/2020 1022   RBC 4.41 10/07/2018 0808   HGB 15.9 11/29/2020 1022   HCT 44.9 11/29/2020 1022   PLT 192 11/29/2020 1022   MCV 96 11/29/2020 1022   MCV 101 (H) 10/02/2014 0503   MCH 33.9 (H) 11/29/2020 1022   MCH 34.2 (H) 10/07/2018 0808   MCHC 35.4 11/29/2020 1022   MCHC 33.3 10/07/2018 0808   RDW 11.9 11/29/2020 1022   RDW 12.6 10/02/2014 0503   LYMPHSABS 1.5 10/07/2018 0808   LYMPHSABS 1.3 10/02/2014 0503   MONOABS 0.6 10/07/2018 0808   MONOABS 1.0 (H) 10/02/2014 0503   EOSABS 0.3 10/07/2018 0808   EOSABS 0.0 10/02/2014 0503   BASOSABS 0.0 10/07/2018 0808   BASOSABS 0.0 10/02/2014 0503   CMP     Component Value Date/Time   NA 136 11/29/2020 1022   NA 139 10/02/2014 0503   K 4.2 11/29/2020 1022   K 3.8 10/02/2014 0503   CL 99 11/29/2020 1022   CL 110 (H) 10/02/2014 0503   CO2 20 11/29/2020 1022   CO2 22 10/02/2014 0503   GLUCOSE 104 (H) 11/29/2020 1022   GLUCOSE 120 (H) 10/07/2018 0808   GLUCOSE 133 (H) 10/02/2014 0503   BUN 11 11/29/2020 1022   BUN 10 10/02/2014 0503   CREATININE 0.76 11/29/2020 1022   CREATININE 0.70 10/02/2014 0503   CALCIUM 9.6 11/29/2020 1022   CALCIUM 7.6 (L) 10/02/2014 0503   PROT 7.2 11/29/2020 1022   ALBUMIN 4.8 11/29/2020 1022   AST 39 11/29/2020 1022   ALT 67 (H) 11/29/2020 1022   ALKPHOS 62 11/29/2020 1022   BILITOT 0.7 11/29/2020 1022   GFRNONAA 92 11/29/2020 1022   GFRNONAA >60 10/02/2014 0503   GFRAA 106 11/29/2020 1022   GFRAA >60 10/02/2014 0503    Imaging Studies: No results found.  Assessment and Plan:   Beth [name] is a 50 y.o. y/o female has been referred for rectal lesion showing "high-grade squamous intraepithelial lesion (AIN 3/carcinoma in situ) at least, cannot exclude invasive small cell carcinoma."  Assessment and Plan: Above results discussed in detail with  patient and importance of follow-up discussed as well  Typically, these lesions are associated with HPV Patient will be tested for HIV  Patient will need surgical referral, referred to [place] [facility] Will also refer to oncology  Patient agreeable with above plan  I personally reviewed her previous labs and she has had chronic elevation in transaminases with right upper quadrant ultrasound recently showing fatty liver  Liver enzymes likely elevated due to fatty liver  No evidence of any abnormalities in liver function test.  Do not suspect cirrhosis at this time  Finding of fatty liver on imaging discussed with patient Diet, weight loss, and  exercise encouraged along with avoiding hepatotoxic drugs including alcohol Risk of progression to cirrhosis if above measures are not instituted were discussed as well, and patient verbalized understanding  Obtain fatty liver panel at this time and if patient is not immune to hepatitis a and B, recommend vaccination Follow Up Instructions:   I discussed the assessment and treatment plan with the patient. The patient was provided an opportunity to ask questions and all were answered. The patient agreed with the plan and demonstrated an understanding of the instructions.   The patient was advised to call back or seek an in-person evaluation if the symptoms worsen or if the condition fails to improve as anticipated.  I provided 15 minutes of face-to-face time via video software during this encounter.  Additional time was spent in reviewing patient's chart, placing orders etc.   Beth Manifold, MD  Speech recognition software was used to dictate the above note.

## 2021-02-03 ENCOUNTER — Telehealth: Payer: Self-pay | Admitting: Gastroenterology

## 2021-02-03 DIAGNOSIS — C189 Malignant neoplasm of colon, unspecified: Secondary | ICD-10-CM | POA: Insufficient documentation

## 2021-02-03 NOTE — Telephone Encounter (Signed)
Patient called LVM to call back.  Patient is confused about her referral and results. Patient had video visit with you yesterday and has additional questions. Patient is very anxious to hear from you .

## 2021-02-07 ENCOUNTER — Encounter: Payer: Self-pay | Admitting: Surgery

## 2021-02-07 ENCOUNTER — Telehealth: Payer: BC Managed Care – PPO | Admitting: Gastroenterology

## 2021-02-07 ENCOUNTER — Ambulatory Visit: Payer: Self-pay | Admitting: Surgery

## 2021-02-07 ENCOUNTER — Telehealth: Payer: Self-pay | Admitting: Gastroenterology

## 2021-02-07 DIAGNOSIS — Z720 Tobacco use: Secondary | ICD-10-CM | POA: Diagnosis not present

## 2021-02-07 DIAGNOSIS — D013 Carcinoma in situ of anus and anal canal: Secondary | ICD-10-CM | POA: Diagnosis not present

## 2021-02-07 NOTE — Telephone Encounter (Signed)
Patient waiting for return phone call regarding referral to an Oncologist, patient prefers [facility]. Patient has other questions regarding her treatment.  Please call to advise

## 2021-02-07 NOTE — H&P (View-Only) (Signed)
Beth [name] Appointment: 02/07/2021 1:45 PM Location: [facility] Patient #: [id number] DOB: [birth year] Married / Language: Beth [name] / Race: White Female  History of Present Illness [name] MD; 02/07/2021 1:55 PM) The patient is a 50 year old female who presents with an anal fissure. Note for "Anal fissure": Patient returns status post examination under anesthesia with internal sphincterotomy excision of sentinel tag 08/30/2016.  Sentinel tag showed high-grade anal intraepithelial neoplasia, AIN III.   Woman with history of normal colonoscopy 2015. Had anal pain and probable fissure. Saw Dr. Penelope Coop with [facility]. Did not improve for diltiazem cream. Sent to me. I did examination under anesthesia with partial internal sphincterotomy and excised sentinel tag 08/2016. Anal intraepithelial neoplasia III was found in the sentinel tag. Followed her postoperatively. Six-month follow-up noted some discomfort but no major lesion. Recommended to REtry diltiazem & consider follow-up EUA if she did not resolve her symptoms. She is not HIV positive. No history of her condylomas. Smoker. Encouraged her to quit smoking.  I have not heard from her and almost 4 years.  Looks like she switched to a different gastroenterologist. Underwent screening colonoscopy earlier this month by Dr Bonna Gains. Anal lesion noted. Biopsy shows anal intraepithelial neoplasia III. Surgical consultation requested.  Patient notes that she had been using MiraLAX to avoid hard stools. Anal pain went away. She was feeling fine until about 3 months ago when she felt some discomfort and occasional blood when she wipes. Reached out to gastroenterology. Colonoscopy offered. Patient denies any constipation. She still smokes. She's had Pap smears followed by gynecology with history of prior hysterectomy. Hysterectomy without any cervical cancer nor any concerning  myometrium.    ###################################################  SURGICAL PATHOLOGY CASE: [id number] PATIENT: Beth [name] Surgical Pathology Report     Specimen Submitted: A. Colon polyp x2, sigmoid; cold snare B. Rectum lesion; cbx  Clinical History: Screening colonoscopy. Polyps; hemorrhoids; rectal lesion      DIAGNOSIS: A. COLON POLYP X2, SIGMOID; COLD SNARE: - HYPERPLASTIC POLYP, ONE FRAGMENT. - INTRAMUCOSAL LYMPHOID AGGREGATES, ONE FRAGMENT. - NEGATIVE FOR DYSPLASIA AND MALIGNANCY.  B. RECTUM LESION; COLD BIOPSY: - HIGH GRADE SQUAMOUS INTRAEPITHELIAL LESION (AIN 3 / CARCINOMA IN SITU) AT LEAST, CANNOT EXCLUDE INVASIVE SQUAMOUS CELL CARCINOMA.   Nikeisha Klutz DESCRIPTION: A. Labeled: Cold snare sigmoid colon polyp x2 Received: Formalin Collection time: 8:34 AM on 01/28/2021 Placed into formalin time: 8:34 AM on 01/28/2021 Tissue fragment(s): 2 Size: Range from 0.3-1 cm Description: Received are 2 fragments of tan-pink soft tissue. The larger fragment is elongated and has a distinct resection margin which is inked blue. Entirely submitted in 1 cassette.  B. Labeled: cbx rectal lesion Received: Formalin Collection time: 8:41 AM on 01/28/2021 Placed into formalin time: 8:41 AM on 01/28/2021 Tissue fragment(s): Multiple Size: Aggregate, 0.8 x 0.4 x 0.2 cm Description: Received are fragments of tan-pink soft tissue. There are 2 prominent fragments with multiple smaller minute fragments that may not survive processing. Entirely submitted in 1 cassette.   Final Diagnosis performed by Betsy Pries, MD. Electronically signed 01/31/2021 3:02:29PM The electronic signature indicates that the named Attending Pathologist has evaluated the specimen Technical component performed at [facility], [address] [place], [place] [address] Lab: [phone] Dir: Rush Farmer, MD, MMM Professional component performed at [facility], [facility], [place], [place], [place] [address] Lab: [phone] Dir: Dellia Nims. Reuel Derby, MD   #####################################################  PRIOR SURGERY 2017:  08/30/2016  2:13 PM  PATIENT: Beth [name] 50 y.o. female  Patient Care Team: Caryl Bis, MD as PCP - General (Unknown Physician Specialty) Michael Boston, MD as Consulting Physician (General Surgery) Wonda Horner, MD as Consulting Physician (Gastroenterology)  PRE-OPERATIVE DIAGNOSIS: chronic anal fissure.  POST-OPERATIVE DIAGNOSIS: chronic anal fissure  PROCEDURE:  LATERAL INTERNAL AND SPHINCTEROTOMY EXAM UNDER ANESTHESIA  SURGEON: Surgeon(s): Michael Boston, MD  ASSISTANT: Olene Floss, PA-S, [facility]  ANESTHESIA:  Local field block Anorectal block General  0.25% bupivacaine with epinephrine at the beginning of the case.  Liposomal bupivacaine (Experel) at the end of the case.  EBL: Total I/O In: - Out: 5 [Blood:5]  Delay start of Pharmacological VTE agent (>24hrs) due to surgical blood loss or risk of bleeding: no  DRAINS: none  SPECIMEN: Source of Specimen: Sentinel tag  DISPOSITION OF SPECIMEN: PATHOLOGY  COUNTS: YES  PLAN OF CARE: Discharge to home after PACU  PATIENT DISPOSITION: PACU - hemodynamically stable.  INDICATION: Patient with probable chronic anal fissure refractory to bowel regimen & medical management. I recommended examination and surgical treatment:  The anatomy & physiology of the anorectal region was discussed. The pathophysiology of anal fissure and differential diagnosis was discussed. Natural history progression was discussed. I stressed the importance of a bowel regimen to have daily soft bowel movements to minimize progression of disease.   The patient's condition is not adequately controlled. Non-operative treatment has not healed the fissure. Therefore, I recommended examination under anesthesia for better examination to confirm  the diagnosis and treat by lateral internal sphincterotomy to relax the spasm better & allow the fissure to heal. Technique, benefits, alternatives were discussed. I noted a good likelihood this will help address the problem. Risks such as bleeding, pain, incontinence, recurrence, heart attack, death, and other risks were discussed.   Educational handouts further explaining the pathology, treatment options, and bowel regimen were given as well. The patient expressed understanding & wishes to proceed with surgery.  OR FINDINGS: Patient had a posterior midline chronic anal fissure with a hypertensive sphincter & large sentinel anal tag.   Sphincterotomy location: Left lateral anal canal. 66% distal internal sphincterotomy performed  IDESCRIPTION:  Informed consent was confirmed. Patient underwent general anesthesia without difficulty. Patient was placed into prone positioning. The perianal region was prepped and draped in sterile fashion. Surgical timeout confirmed or plan.  I did digital rectal examination and then transitioned over to anoscopy to get a sense of the anatomy. Patient had a classic posterior midline sentinel anal tag. I identified an anal fissure in the posterior midline anal canal. He was actually quite large. Some fibrosis at the base. The sphincter tone was increased. No stricture. No abscess located. No fistula  I went ahead and proceeded with internal sphinterotmy technique. I enetered through the anoderm of the left lateral anal canal longitudinally. I identified the internal and external sphincters. I elevated the internal sphincter. I proceeded with a partial internal sphincterotomy starting distally and moving proximally using cautery. Since this was a larger than average fissure, this involved the distal 66% thickness. This provided improved relaxation of the anal sphincter. I excised the fibrotic sentinel tag and some of the fibrotic edges of the  fissure. Cauterized the base of the wound for hemostasis until it was a nice soft flat broad wound. Hemostasis was good.  I closed the anal canal wound vertically using a running 2-0 chromic suture to good result, leaving a 3 mm distal opening to allow drainage. Hemostasis was excellent.  I reexamined the anal canal. There is was no  narrowing. Hemostasis was excellent. I repeated anoscopy and examination. Hemostasis was good. Patient is being extubated go to recovery room.  I discussed operative findings, updated the patient's status, discussed probable steps to recovery, and gave postoperative recommendations to the patient's family. Recommendations were made. Questions were answered. They expressed understanding & appreciation.  [name], M.D., F.A.C.S. Gastrointestinal and Minimally Invasive Surgery [facility] [address] [place], [place] [address] [phone] Main / Paging    Diagnosis Fistula, sentinel tag - HIGH GRADE ANAL INTRAEPITHELIAL NEOPLASIA, AIN-III. - SEE MICROSCOPIC DESCRIPTION. Microscopic Comment The specimen shows extensive AIN-III with areas showing bulbous extensions of dysplastic squamous epithelium into the underlying stroma. This is felt to represent tangential sectioning rather than invasive carcinoma; however, focal, microscopic superficial stromal invasion cannot be ruled out. The AIN-III is focally less than 0.1 cm from the cauterized margin of the specimen. Multiple additional levels are examined and the findings are similar. Case discussed with Dr Johney Maine on 08/31/16. (JDP:ecj 08/31/2016) Claudette Laws MD Pathologist, Electronic Signature (Case signed 08/31/2016)   Problem List/Past Medical [name], MD; 02/07/2021 1:34 PM) ANAL FISSURE (K60.2) ENCOUNTER FOR PREOPERATIVE EXAMINATION FOR GENERAL SURGICAL PROCEDURE (Z01.818) TOBACCO ABUSE (Z72.0) CHRONIC CONSTIPATION (K59.09) HISTORY OF  RECTAL SPHINCTEROTOMY (Z98.890) ANAL INTRAEPITHELIAL NEOPLASIA III (D01.3)  Past Surgical History [name], MD; 02/07/2021 1:34 PM) Hysterectomy (not due to cancer) - Partial  Diagnostic Studies History [name], MD; 02/07/2021 1:34 PM) Colonoscopy 1-5 years ago Mammogram 1-3 years ago Pap Smear 1-5 years ago  Allergies [name], MD; 02/07/2021 1:34 PM) Sulfa Antibiotics Allergies Reconciled  Medication History (Alisha Spillers, CMA; 02/07/2021 1:45 PM) DILTIAZEM Gel (2% Gel, 1 (one) application External four times daily, Taken starting 04/23/2017) Active. (Apply on anus for 3-6 weeks to allow fissure to heal) ValACYclovir HCl (500MG  Tablet, Oral as needed) Active. Multivitamin Adult (Oral) Active. MiraLax (Oral) Active. ProAir HFA (108 (90 Base)MCG/ACT Aerosol Soln, Inhalation) Active. Medications Reconciled  Social History [name], MD; 02/07/2021 1:34 PM) Alcohol use Occasional alcohol use. Caffeine use Coffee. No drug use Tobacco use Current every day smoker.  Family History [name], MD; 02/07/2021 1:34 PM) Diabetes Mellitus Daughter, Father.  Pregnancy / Birth History [name], MD; 02/07/2021 1:34 PM) Age at menarche 56 years. Gravida 3 Maternal age 67-25 Para 70  Other Problems [name], MD; 02/07/2021 1:34 PM) No pertinent past medical history    Vitals Lars Mage Spillers CMA; 02/07/2021 1:45 PM) 02/07/2021 1:44 PM Weight: 162.6 lb Height: 66in Body Surface Area: 1.83 m Body Mass Index: 26.24 kg/m  Temp.: 98.25F(Oral)  Pulse: 97 (Regular)  BP: 142/84(Sitting, Left Arm, Standard)        Physical Exam [name] MD; 02/07/2021 1:51 PM)  General Mental Status-Alert. General Appearance-Not in acute distress. Voice-Normal.  Integumentary Global Assessment Upon inspection and palpation of skin surfaces of the - Distribution of scalp and body hair is  normal. General Characteristics Overall examination of the patient's skin reveals - no rashes and no suspicious lesions.  Head and Neck Head-normocephalic, atraumatic with no lesions or palpable masses. Face Global Assessment - atraumatic, no absence of expression. Neck Global Assessment - no abnormal movements, no decreased range of motion. Trachea-midline. Thyroid Gland Characteristics - non-tender.  Eye Eyeball - Left-Extraocular movements intact, No Nystagmus - Left. Eyeball - Right-Extraocular movements intact, No Nystagmus - Right. Upper Eyelid - Left-No Cyanotic - Left. Upper Eyelid - Right-No Cyanotic - Right.  Chest and Lung Exam Inspection Accessory muscles - No use of accessory muscles in breathing.  Abdomen Note: Soft and flat  Female Genitourinary Note: Perianal skin clear. No more fissure. Sensitivity at anal sphincter but sphincter tone much more normal now. No rash or irritation. No condyloma. Held off on any digital exam today.  Rectal Note: Right posterior midline small little anal tag. Increased sphincter tone.  Barely tolerates digital exam. Elevated polypoid mass in right posterior anal canal involving about 20% of circumference. Fibrotic but mobile. Cannot tolerate anoscopy so not attempted. Normal sphincter tone. I see no obvious wound. No fistula or abscess.  Exam done with assistance of female Medical Assistant in the room.  Peripheral Vascular Upper Extremity Inspection - Left - Not Gangrenous, No Petechiae. Inspection - Right - Not Gangrenous, No Petechiae.  Neurologic Neurologic evaluation reveals -normal attention span and ability to concentrate, able to name objects and repeat phrases. Appropriate fund of knowledge and normal coordination.  Neuropsychiatric Mental status exam performed with findings of-able to articulate well with normal speech/language, rate, volume and coherence and no evidence of hallucinations,  delusions, obsessions or homicidal/suicidal ideation. Orientation-oriented X3.  Musculoskeletal Global Assessment Gait and Station - normal gait and station.  Lymphatic General Lymphatics Description - No Generalized lymphadenopathy.    Assessment & Plan [name] MD; 02/07/2021 2:02 PM)  ANAL INTRAEPITHELIAL NEOPLASIA III (D01.3) Story: Incidental anal intraepithelial neoplasia III noted within chronic sentinel tag status post excision 2017.  Anal canal mass with AIN 3 Impression: She has recurrent anorectal mass. Biopsies do not show any definite cancer. While firm, it feels mobile. I recommended examination under anesthesia with excision versus biopsy. She is too uncomfortable try more aggressive biopsying here. Endoscopic biopsy did not show any definite cancer  If true anal cancer, may require chemoradiation therapy.  If AIN 3 or less, hopefully local excision and more aggressive anoscopic follow-up may be all that is needed.  I again encouraged her to quit smoking.  Current Plans Pt Education - [facility] Anal Warts / AIN Dysplasia (AT) The anatomy & physiology of the anorectal region was discussed. The pathophysiology of anorectal warts and differential diagnosis was discussed. Natural history risks without surgery was discussed such as further growth and cancer. I stressed the importance of office follow-up to catch early recurrence & minimize/halt progression of disease. Interventions such as cauterization or cryotherapy by topical agents were discussed.  The patient's symptoms are not adequately controlled by non-operative treatments. I feel the risks & problems of no surgery outweigh the operative risks; therefore, I recommended surgery to excise versus biopsy the anal canal mass.  Risks such as bleeding, infection, need for further treatment, heart attack, death, and other risks were discussed. I noted a good likelihood this will help address the problem. Goals  of post-operative recovery were discussed as well. Possibility that this will not correct all symptoms was explained. Post-operative pain, bleeding, constipation, and other problems after surgery were discussed. We will work to minimize complications. Educational handouts further explaining the pathology, treatment options, and bowel regimen were given as well. Questions were answered. The patient expresses understanding & wishes to proceed with surgery.   ENCOUNTER FOR PREOPERATIVE EXAMINATION FOR GENERAL SURGICAL PROCEDURE (Z01.818)  Current Plans You are being scheduled for surgery- Our schedulers will call you.  You should hear from our office's scheduling department within 5 working days about the location, date, and time of surgery. We try to make accommodations for patient's preferences in scheduling surgery,  but sometimes the OR schedule or the surgeon's schedule prevents [place] from making those accommodations.  If you have not heard from our office [phone]) in 5 working days, call the office and ask for your surgeon's nurse.  If you have other questions about your diagnosis, plan, or surgery, call the office and ask for your surgeon's nurse.  Pt Education - [facility] Rectal Prep for Anorectal outpatient/office surgery: discussed with patient and provided information. Pt Education - [facility] Rectal Surgery HCI (Minetta Krisher): discussed with patient and provided information. Pt Education - [facility] Good Bowel Health ([employer])  TOBACCO ABUSE (Z72.0) Impression: I noted that smokers have increased pain and poor wound healing.  I again strongly recommend she quit smoking to break the cycle of wound problems and other health issues.  Current Plans Pt Education - [facility] STOP SMOKING!  [name], MD, FACS, MASCRS  Gastrointestinal and Minimally Invasive Surgery  [facility] [address], [place], [place] [address] [phone] Fax [phone]  Main/Paging  CONTACT INFORMATION: Weekday (9AM-5PM) concerns: Call [facility] main office at [phone] Weeknight (5PM-9AM) or Weekend/Holiday concerns: Check [email or web address] for General Surgery [facility] coverage (Please, do not use SecureChat as it is not reliable communication to operating surgeons for immediate patient care)

## 2021-02-07 NOTE — Telephone Encounter (Signed)
Pt stated she just needs clarification again. She was a bit overwhelmed at her appt. She has an appt today with oncology. She has further questions.

## 2021-02-07 NOTE — H&P (Signed)
Beth [name] Appointment: 02/07/2021 1:45 PM Location: [facility] Patient #: [id number] DOB: [birth year] Married / Language: Beth [name] / Race: White Female  History of Present Illness Beth Hector MD; 02/07/2021 1:55 PM) The patient is a 50 year old female who presents with an anal fissure. Note for "Anal fissure": Patient returns status post examination under anesthesia with internal sphincterotomy excision of sentinel tag 08/30/2016.  Sentinel tag showed high-grade anal intraepithelial neoplasia, AIN III.   Woman with history of normal colonoscopy 2015. Had anal pain and probable fissure. Saw Dr. Penelope [name] with [name]. Did not improve for diltiazem cream. Sent to me. I did examination under anesthesia with partial internal sphincterotomy and excised sentinel tag 08/2016. Anal intraepithelial neoplasia III was found in the sentinel tag. Followed her postoperatively. Six-month follow-up noted some discomfort but no major lesion. Recommended to REtry diltiazem & consider follow-up EUA if she did not resolve her symptoms. She is not HIV positive. No history of her condylomas. Smoker. Encouraged her to quit smoking.  I have not heard from her and almost 4 years.  Looks like she switched to a different gastroenterologist. Underwent screening colonoscopy earlier this month by Dr Beth [name]. Anal lesion noted. Biopsy shows anal intraepithelial neoplasia III. Surgical consultation requested.  Patient notes that she had been using MiraLAX to avoid hard stools. Anal pain went away. She was feeling fine until about 3 months ago when she felt some discomfort and occasional blood when she wipes. Reached out to gastroenterology. Colonoscopy offered. Patient denies any constipation. She still smokes. She's had Pap smears followed by gynecology with history of prior hysterectomy. Hysterectomy without any cervical cancer nor any concerning  myometrium.    ###################################################  SURGICAL PATHOLOGY CASE: [id number] PATIENT: [name] Report     Specimen Submitted: A. Colon polyp x2, sigmoid; cold snare B. Rectum lesion; cbx  Clinical History: Screening colonoscopy. Polyps; hemorrhoids; rectal lesion      DIAGNOSIS: A. COLON POLYP X2, SIGMOID; COLD SNARE: - HYPERPLASTIC POLYP, ONE FRAGMENT. - INTRAMUCOSAL LYMPHOID AGGREGATES, ONE FRAGMENT. - NEGATIVE FOR DYSPLASIA AND MALIGNANCY.  B. RECTUM LESION; COLD BIOPSY: - HIGH GRADE SQUAMOUS INTRAEPITHELIAL LESION (AIN 3 / CARCINOMA IN SITU) AT LEAST, CANNOT EXCLUDE INVASIVE SQUAMOUS CELL CARCINOMA.   Adreana Coull DESCRIPTION: A. Labeled: Cold snare sigmoid colon polyp x2 Received: Formalin Collection time: 8:34 AM on 01/28/2021 Placed into formalin time: 8:34 AM on 01/28/2021 Tissue fragment(s): 2 Size: Range from 0.3-1 cm Description: Received are 2 fragments of tan-pink soft tissue. The larger fragment is elongated and has a distinct resection margin which is inked blue. Entirely submitted in 1 cassette.  B. Labeled: cbx rectal lesion Received: Formalin Collection time: 8:41 AM on 01/28/2021 Placed into formalin time: 8:41 AM on 01/28/2021 Tissue fragment(s): Multiple Size: Aggregate, 0.8 x 0.4 x 0.2 cm Description: Received are fragments of tan-pink soft tissue. There are 2 prominent fragments with multiple smaller minute fragments that may not survive processing. Entirely submitted in 1 cassette.   Final Diagnosis performed by Beth Pries, MD. Electronically signed 01/31/2021 3:02:29PM The electronic signature indicates that the named Attending Pathologist has evaluated the specimen Technical component performed at [name], [address] [place], [place] [address] Lab: [id number] Dir: Beth Farmer, MD, MMM Professional component performed at [facility], [name], [place], [place], [place] [address] Lab: [id number] Dir: Beth Nims. Reuel Derby, MD   #####################################################  PRIOR SURGERY 2017:  08/30/2016  2:13 PM  PATIENT: Beth [name] 50 y.o. female  Patient Care Team: Beth Bis, MD as PCP - General (Unknown Physician Specialty) Beth Boston, MD as Consulting Physician (General Surgery) Beth Horner, MD as Consulting Physician (Gastroenterology)  PRE-OPERATIVE DIAGNOSIS: chronic anal fissure.  POST-OPERATIVE DIAGNOSIS: chronic anal fissure  PROCEDURE:  LATERAL INTERNAL AND SPHINCTEROTOMY EXAM UNDER ANESTHESIA  SURGEON: Surgeon(s): Beth Boston, MD  ASSISTANT: Beth Floss, PA-S, [facility]  ANESTHESIA:  Local field block Anorectal block General  0.25% bupivacaine with epinephrine at the beginning of the case.  Liposomal bupivacaine (Experel) at the end of the case.  EBL: Total I/O In: - Out: 5 [Blood:5]  Delay start of Pharmacological VTE agent (>24hrs) due to surgical blood loss or risk of bleeding: no  DRAINS: none  SPECIMEN: Source of Specimen: Sentinel tag  DISPOSITION OF SPECIMEN: PATHOLOGY  COUNTS: YES  PLAN OF CARE: Discharge to home after PACU  PATIENT DISPOSITION: PACU - hemodynamically stable.  INDICATION: Patient with probable chronic anal fissure refractory to bowel regimen & medical management. I recommended examination and surgical treatment:  The anatomy & physiology of the anorectal region was discussed. The pathophysiology of anal fissure and differential diagnosis was discussed. Natural history progression was discussed. I stressed the importance of a bowel regimen to have daily soft bowel movements to minimize progression of disease.   The patient's condition is not adequately controlled. Non-operative treatment has not healed the fissure. Therefore, I recommended examination under anesthesia for better examination to confirm  the diagnosis and treat by lateral internal sphincterotomy to relax the spasm better & allow the fissure to heal. Technique, benefits, alternatives were discussed. I noted a good likelihood this will help address the problem. Risks such as bleeding, pain, incontinence, recurrence, heart attack, death, and other risks were discussed.   Educational handouts further explaining the pathology, treatment options, and bowel regimen were given as well. The patient expressed understanding & wishes to proceed with surgery.  OR FINDINGS: Patient had a posterior midline chronic anal fissure with a hypertensive sphincter & large sentinel anal tag.   Sphincterotomy location: Left lateral anal canal. 66% distal internal sphincterotomy performed  IDESCRIPTION:  Informed consent was confirmed. Patient underwent general anesthesia without difficulty. Patient was placed into prone positioning. The perianal region was prepped and draped in sterile fashion. Surgical timeout confirmed or plan.  I did digital rectal examination and then transitioned over to anoscopy to get a sense of the anatomy. Patient had a classic posterior midline sentinel anal tag. I identified an anal fissure in the posterior midline anal canal. He was actually quite large. Some fibrosis at the base. The sphincter tone was increased. No stricture. No abscess located. No fistula  I went ahead and proceeded with internal sphinterotmy technique. I enetered through the anoderm of the left lateral anal canal longitudinally. I identified the internal and external sphincters. I elevated the internal sphincter. I proceeded with a partial internal sphincterotomy starting distally and moving proximally using cautery. Since this was a larger than average fissure, this involved the distal 66% thickness. This provided improved relaxation of the anal sphincter. I excised the fibrotic sentinel tag and some of the fibrotic edges of the  fissure. Cauterized the base of the wound for hemostasis until it was a nice soft flat broad wound. Hemostasis was good.  I closed the anal canal wound vertically using a running 2-0 chromic suture to good result, leaving a 3 mm distal opening to allow drainage. Hemostasis was excellent.  I reexamined the anal canal. There is was no  narrowing. Hemostasis was excellent. I repeated anoscopy and examination. Hemostasis was good. Patient is being extubated go to recovery room.  I discussed operative findings, updated the patient's status, discussed probable steps to recovery, and gave postoperative recommendations to the patient's family. Recommendations were made. Questions were answered. They expressed understanding & appreciation.  Beth [name], M.D., F.A.C.S. Gastrointestinal and Minimally Invasive Surgery [facility] [address] [place], [place] [address] [phone] Main / Paging    Diagnosis Fistula, sentinel tag - HIGH GRADE ANAL INTRAEPITHELIAL NEOPLASIA, AIN-III. - SEE MICROSCOPIC DESCRIPTION. Microscopic Comment The specimen shows extensive AIN-III with areas showing bulbous extensions of dysplastic squamous epithelium into the underlying stroma. This is felt to represent tangential sectioning rather than invasive carcinoma; however, focal, microscopic superficial stromal invasion cannot be ruled out. The AIN-III is focally less than 0.1 cm from the cauterized margin of the specimen. Multiple additional levels are examined and the findings are similar. Case discussed with Dr Beth [name] on 08/31/16. (JDP:ecj 08/31/2016) Beth [name] Laws MD Pathologist, Electronic Signature (Case signed 08/31/2016)   Problem List/Past Medical Beth Hector, MD; 02/07/2021 1:34 PM) ANAL FISSURE (K60.2) ENCOUNTER FOR PREOPERATIVE EXAMINATION FOR GENERAL SURGICAL PROCEDURE (Z01.818) TOBACCO ABUSE (Z72.0) CHRONIC CONSTIPATION (K59.09) HISTORY OF  RECTAL SPHINCTEROTOMY (Z98.890) ANAL INTRAEPITHELIAL NEOPLASIA III (D01.3)  Past Surgical History Beth Hector, MD; 02/07/2021 1:34 PM) Hysterectomy (not due to cancer) - Partial  Diagnostic Studies History Beth Hector, MD; 02/07/2021 1:34 PM) Colonoscopy 1-5 years ago Mammogram 1-3 years ago Pap Smear 1-5 years ago  Allergies Beth Hector, MD; 02/07/2021 1:34 PM) Sulfa Antibiotics Allergies Reconciled  Medication History (Beth [name], CMA; 02/07/2021 1:45 PM) DILTIAZEM Gel (2% Gel, 1 (one) application External four times daily, Taken starting 04/23/2017) Active. (Apply on anus for 3-6 weeks to allow fissure to heal) ValACYclovir HCl (500MG  Tablet, Oral as needed) Active. Multivitamin Adult (Oral) Active. MiraLax (Oral) Active. ProAir HFA (108 (90 Base)MCG/ACT Aerosol Soln, Inhalation) Active. Medications Reconciled  Social History Beth Hector, MD; 02/07/2021 1:34 PM) Alcohol use Occasional alcohol use. Caffeine use Coffee. No drug use Tobacco use Current every day smoker.  Family History Beth Hector, MD; 02/07/2021 1:34 PM) Diabetes Mellitus Daughter, Father.  Pregnancy / Birth History Beth Hector, MD; 02/07/2021 1:34 PM) Age at menarche 85 years. Gravida 3 Maternal age 16-25 Para 36  Other Problems Beth Hector, MD; 02/07/2021 1:34 PM) No pertinent past medical history    Vitals Beth [name] CMA; 02/07/2021 1:45 PM) 02/07/2021 1:44 PM Weight: 162.6 lb Height: 66in Body Surface Area: 1.83 m Body Mass Index: 26.24 kg/m  Temp.: 98.76F(Oral)  Pulse: 97 (Regular)  BP: 142/84(Sitting, Left Arm, Standard)        Physical Exam Beth Hector MD; 02/07/2021 1:51 PM)  General Mental Status-Alert. General Appearance-Not in acute distress. Voice-Normal.  Integumentary Global Assessment Upon inspection and palpation of skin surfaces of the - Distribution of scalp and body hair is  normal. General Characteristics Overall examination of the patient's skin reveals - no rashes and no suspicious lesions.  Head and Neck Head-normocephalic, atraumatic with no lesions or palpable masses. Face Global Assessment - atraumatic, no absence of expression. Neck Global Assessment - no abnormal movements, no decreased range of motion. Trachea-midline. Thyroid Gland Characteristics - non-tender.  Eye Eyeball - Left-Extraocular movements intact, No Nystagmus - Left. Eyeball - Right-Extraocular movements intact, No Nystagmus - Right. Upper Eyelid - Left-No Cyanotic - Left. Upper Eyelid - Right-No Cyanotic - Right.  Chest and Lung Exam Inspection Accessory muscles - No use of accessory muscles in breathing.  Abdomen Note: Soft and flat  Female Genitourinary Note: Perianal skin clear. No more fissure. Sensitivity at anal sphincter but sphincter tone much more normal now. No rash or irritation. No condyloma. Held off on any digital exam today.  Rectal Note: Right posterior midline small little anal tag. Increased sphincter tone.  Barely tolerates digital exam. Elevated polypoid mass in right posterior anal canal involving about 20% of circumference. Fibrotic but mobile. Cannot tolerate anoscopy so not attempted. Normal sphincter tone. I see no obvious wound. No fistula or abscess.  Exam done with assistance of female Medical Assistant in the room.  Peripheral Vascular Upper Extremity Inspection - Left - Not Gangrenous, No Petechiae. Inspection - Right - Not Gangrenous, No Petechiae.  Neurologic Neurologic evaluation reveals -normal attention span and ability to concentrate, able to name objects and repeat phrases. Appropriate fund of knowledge and normal coordination.  Neuropsychiatric Mental status exam performed with findings of-able to articulate well with normal speech/language, rate, volume and coherence and no evidence of hallucinations,  delusions, obsessions or homicidal/suicidal ideation. Orientation-oriented X3.  Musculoskeletal Global Assessment Gait and Station - normal gait and station.  Lymphatic General Lymphatics Description - No Generalized lymphadenopathy.    Assessment & Plan Beth Hector MD; 02/07/2021 2:02 PM)  ANAL INTRAEPITHELIAL NEOPLASIA III (D01.3) Story: Incidental anal intraepithelial neoplasia III noted within chronic sentinel tag status post excision 2017.  Anal canal mass with AIN 3 Impression: She has recurrent anorectal mass. Biopsies do not show any definite cancer. While firm, it feels mobile. I recommended examination under anesthesia with excision versus biopsy. She is too uncomfortable try more aggressive biopsying here. Endoscopic biopsy did not show any definite cancer  If true anal cancer, may require chemoradiation therapy.  If AIN 3 or less, hopefully local excision and more aggressive anoscopic follow-up may be all that is needed.  I again encouraged her to quit smoking.  Current Plans Pt Education - [facility] Anal Warts / AIN Dysplasia (AT) The anatomy & physiology of the anorectal region was discussed. The pathophysiology of anorectal warts and differential diagnosis was discussed. Natural history risks without surgery was discussed such as further growth and cancer. I stressed the importance of office follow-up to catch early recurrence & minimize/halt progression of disease. Interventions such as cauterization or cryotherapy by topical agents were discussed.  The patient's symptoms are not adequately controlled by non-operative treatments. I feel the risks & problems of no surgery outweigh the operative risks; therefore, I recommended surgery to excise versus biopsy the anal canal mass.  Risks such as bleeding, infection, need for further treatment, heart attack, death, and other risks were discussed. I noted a good likelihood this will help address the problem. Goals  of post-operative recovery were discussed as well. Possibility that this will not correct all symptoms was explained. Post-operative pain, bleeding, constipation, and other problems after surgery were discussed. We will work to minimize complications. Educational handouts further explaining the pathology, treatment options, and bowel regimen were given as well. Questions were answered. The patient expresses understanding & wishes to proceed with surgery.   ENCOUNTER FOR PREOPERATIVE EXAMINATION FOR GENERAL SURGICAL PROCEDURE (Z01.818)  Current Plans You are being scheduled for surgery- Our schedulers will call you.  You should hear from our office's scheduling department within 5 working days about the location, date, and time of surgery. We try to make accommodations for patient's preferences in scheduling surgery,  but sometimes the OR schedule or the surgeon's schedule prevents [place] from making those accommodations.  If you have not heard from our office [phone]) in 5 working days, call the office and ask for your surgeon's nurse.  If you have other questions about your diagnosis, plan, or surgery, call the office and ask for your surgeon's nurse.  Pt Education - [facility] Rectal Prep for Anorectal outpatient/office surgery: discussed with patient and provided information. Pt Education - [facility] Rectal Surgery HCI ([place]): discussed with patient and provided information. Pt Education - [facility] Good Bowel Health (Darlinda Bellows)  TOBACCO ABUSE (Z72.0) Impression: I noted that smokers have increased pain and poor wound healing.  I again strongly recommend she quit smoking to break the cycle of wound problems and other health issues.  Current Plans Pt Education - [facility] STOP SMOKING!  Beth Hector, MD, FACS, MASCRS  Gastrointestinal and Minimally Invasive Surgery  [facility] [address] [place], [place] [address] [phone] Fax [phone]  Main/Paging  CONTACT INFORMATION: Weekday (9AM-5PM) concerns: Call [facility] main office at [phone] Weeknight (5PM-9AM) or Weekend/Holiday concerns: Check [email or web address] for General Surgery [facility] coverage (Please, do not use SecureChat as it is not reliable communication to operating surgeons for immediate patient care)

## 2021-02-08 ENCOUNTER — Ambulatory Visit: Payer: BC Managed Care – PPO | Admitting: Gastroenterology

## 2021-02-08 ENCOUNTER — Telehealth: Payer: BC Managed Care – PPO | Admitting: Gastroenterology

## 2021-02-08 NOTE — Telephone Encounter (Signed)
Called and left a message for call back  

## 2021-02-21 ENCOUNTER — Other Ambulatory Visit: Payer: Self-pay

## 2021-02-21 ENCOUNTER — Encounter (HOSPITAL_BASED_OUTPATIENT_CLINIC_OR_DEPARTMENT_OTHER): Payer: Self-pay | Admitting: Surgery

## 2021-02-21 NOTE — Progress Notes (Signed)
Spoke w/ via phone for pre-op interview---pt Lab needs dos----  I stat ( takes spirolactone for facial acne)             Lab results------none COVID test ------02-22-2021 820 Arrive at -------900 am 02-25-2021 NPO after MN NO Solid Food.  Clear liquids from MN until---800 am, drink ensure presurgery drink at 800 am then npo Medications to take morning of surgery -----none Diabetic medication -----n/a Patient Special Instructions -----follow all bowel prep instructions from dr gross Pre-Op special Istructions -----none Patient verbalized understanding of instructions that were given at this phone interview. Patient denies shortness of breath, chest pain, fever, cough at this phone interview.

## 2021-02-22 ENCOUNTER — Other Ambulatory Visit (HOSPITAL_COMMUNITY)
Admission: RE | Admit: 2021-02-22 | Discharge: 2021-02-22 | Disposition: A | Discharge status: Home / Self Care | Payer: BC Managed Care – PPO | Source: Ambulatory Visit | Attending: Surgery | Admitting: Surgery

## 2021-02-22 DIAGNOSIS — D013 Carcinoma in situ of anus and anal canal: Secondary | ICD-10-CM | POA: Diagnosis not present

## 2021-02-22 DIAGNOSIS — Z20822 Contact with and (suspected) exposure to covid-19: Secondary | ICD-10-CM | POA: Insufficient documentation

## 2021-02-22 DIAGNOSIS — Z01812 Encounter for preprocedural laboratory examination: Secondary | ICD-10-CM | POA: Insufficient documentation

## 2021-02-22 DIAGNOSIS — K5909 Other constipation: Secondary | ICD-10-CM | POA: Diagnosis not present

## 2021-02-22 DIAGNOSIS — Z833 Family history of diabetes mellitus: Secondary | ICD-10-CM | POA: Diagnosis not present

## 2021-02-22 DIAGNOSIS — Z86010 Personal history of colonic polyps: Secondary | ICD-10-CM | POA: Diagnosis not present

## 2021-02-22 DIAGNOSIS — A630 Anogenital (venereal) warts: Secondary | ICD-10-CM | POA: Diagnosis not present

## 2021-02-22 DIAGNOSIS — F17200 Nicotine dependence, unspecified, uncomplicated: Secondary | ICD-10-CM | POA: Diagnosis not present

## 2021-02-22 DIAGNOSIS — C211 Malignant neoplasm of anal canal: Secondary | ICD-10-CM | POA: Diagnosis not present

## 2021-02-22 LAB — SARS CORONAVIRUS 2 (TAT 6-24 HRS): SARS Coronavirus 2: NEGATIVE

## 2021-02-25 ENCOUNTER — Ambulatory Visit (HOSPITAL_BASED_OUTPATIENT_CLINIC_OR_DEPARTMENT_OTHER): Payer: BC Managed Care – PPO | Admitting: Anesthesiology

## 2021-02-25 ENCOUNTER — Other Ambulatory Visit: Payer: Self-pay

## 2021-02-25 ENCOUNTER — Encounter (HOSPITAL_BASED_OUTPATIENT_CLINIC_OR_DEPARTMENT_OTHER): Payer: Self-pay | Admitting: Surgery

## 2021-02-25 ENCOUNTER — Ambulatory Visit (HOSPITAL_BASED_OUTPATIENT_CLINIC_OR_DEPARTMENT_OTHER)
Admission: RE | Admit: 2021-02-25 | Discharge: 2021-02-25 | Disposition: A | Discharge status: Home / Self Care | Payer: BC Managed Care – PPO | Attending: Surgery | Admitting: Surgery

## 2021-02-25 ENCOUNTER — Encounter (HOSPITAL_BASED_OUTPATIENT_CLINIC_OR_DEPARTMENT_OTHER)
Admission: RE | Disposition: A | Discharge status: Home / Self Care | Payer: Self-pay | Source: Home / Self Care | Attending: Surgery

## 2021-02-25 DIAGNOSIS — C211 Malignant neoplasm of anal canal: Secondary | ICD-10-CM | POA: Diagnosis not present

## 2021-02-25 DIAGNOSIS — Z833 Family history of diabetes mellitus: Secondary | ICD-10-CM | POA: Diagnosis not present

## 2021-02-25 DIAGNOSIS — C7A1 Malignant poorly differentiated neuroendocrine tumors: Secondary | ICD-10-CM | POA: Diagnosis not present

## 2021-02-25 DIAGNOSIS — D013 Carcinoma in situ of anus and anal canal: Secondary | ICD-10-CM | POA: Diagnosis not present

## 2021-02-25 DIAGNOSIS — F17200 Nicotine dependence, unspecified, uncomplicated: Secondary | ICD-10-CM | POA: Insufficient documentation

## 2021-02-25 DIAGNOSIS — Z86010 Personal history of colonic polyps: Secondary | ICD-10-CM | POA: Insufficient documentation

## 2021-02-25 DIAGNOSIS — K5909 Other constipation: Secondary | ICD-10-CM | POA: Diagnosis not present

## 2021-02-25 DIAGNOSIS — Z20822 Contact with and (suspected) exposure to covid-19: Secondary | ICD-10-CM | POA: Insufficient documentation

## 2021-02-25 DIAGNOSIS — A630 Anogenital (venereal) warts: Secondary | ICD-10-CM | POA: Insufficient documentation

## 2021-02-25 DIAGNOSIS — C210 Malignant neoplasm of anus, unspecified: Secondary | ICD-10-CM

## 2021-02-25 DIAGNOSIS — Z8669 Personal history of other diseases of the nervous system and sense organs: Secondary | ICD-10-CM

## 2021-02-25 DIAGNOSIS — K601 Chronic anal fissure: Secondary | ICD-10-CM | POA: Diagnosis present

## 2021-02-25 DIAGNOSIS — C218 Malignant neoplasm of overlapping sites of rectum, anus and anal canal: Secondary | ICD-10-CM | POA: Diagnosis not present

## 2021-02-25 DIAGNOSIS — C44520 Squamous cell carcinoma of anal skin: Secondary | ICD-10-CM | POA: Diagnosis not present

## 2021-02-25 DIAGNOSIS — N9089 Other specified noninflammatory disorders of vulva and perineum: Secondary | ICD-10-CM | POA: Diagnosis not present

## 2021-02-25 HISTORY — PX: RECTAL EXAM UNDER ANESTHESIA: SHX6399

## 2021-02-25 HISTORY — DX: Acne, unspecified: L70.9

## 2021-02-25 HISTORY — DX: Presence of spectacles and contact lenses: Z97.3

## 2021-02-25 HISTORY — PX: EXCISION HYDRADENITIS LABIA: SHX6273

## 2021-02-25 HISTORY — PX: TRANSANAL EXCISION OF RECTAL MASS: SHX6134

## 2021-02-25 HISTORY — DX: Personal history of other diseases of the nervous system and sense organs: Z86.69

## 2021-02-25 HISTORY — DX: Malignant neoplasm of anus, unspecified: C21.0

## 2021-02-25 LAB — POCT I-STAT, CHEM 8
BUN: 8 mg/dL (ref 6–20)
Calcium, Ion: 1.28 mmol/L (ref 1.15–1.40)
Chloride: 102 mmol/L (ref 98–111)
Creatinine, Ser: 0.5 mg/dL (ref 0.44–1.00)
Glucose, Bld: 122 mg/dL — ABNORMAL HIGH (ref 70–99)
HCT: 47 % — ABNORMAL HIGH (ref 36.0–46.0)
Hemoglobin: 16 g/dL — ABNORMAL HIGH (ref 12.0–15.0)
Potassium: 3.7 mmol/L (ref 3.5–5.1)
Sodium: 139 mmol/L (ref 135–145)
TCO2: 24 mmol/L (ref 22–32)

## 2021-02-25 SURGERY — EXAM UNDER ANESTHESIA, RECTUM
Anesthesia: General | Site: Rectum

## 2021-02-25 MED ORDER — SODIUM CHLORIDE 0.9 % IV SOLN: INTRAVENOUS | Status: DC

## 2021-02-25 MED ORDER — BUPIVACAINE LIPOSOME 1.3 % IJ SUSP
INTRAMUSCULAR | Status: AC
  Filled 2021-02-25: qty 20

## 2021-02-25 MED ORDER — GLYCOPYRROLATE 0.2 MG/ML IJ SOLN
INTRAMUSCULAR | Status: DC | PRN
  Administered 2021-02-25 (×2): .1 mg via INTRAVENOUS

## 2021-02-25 MED ORDER — ACETAMINOPHEN 500 MG PO TABS
ORAL_TABLET | ORAL | Status: AC
  Filled 2021-02-25: qty 2

## 2021-02-25 MED ORDER — GABAPENTIN 300 MG PO CAPS
300.0000 mg | ORAL_CAPSULE | ORAL | Status: AC
  Administered 2021-02-25: 300 mg via ORAL

## 2021-02-25 MED ORDER — BUPIVACAINE-EPINEPHRINE 0.25% -1:200000 IJ SOLN
INTRAMUSCULAR | Status: DC | PRN
  Administered 2021-02-25: 20 mL

## 2021-02-25 MED ORDER — GABAPENTIN 300 MG PO CAPS
ORAL_CAPSULE | ORAL | Status: AC
  Filled 2021-02-25: qty 1

## 2021-02-25 MED ORDER — FENTANYL CITRATE (PF) 100 MCG/2ML IJ SOLN
INTRAMUSCULAR | Status: AC
  Filled 2021-02-25: qty 2

## 2021-02-25 MED ORDER — MIDAZOLAM HCL 2 MG/2ML IJ SOLN: 0.5000 mg | Freq: Once | INTRAMUSCULAR | Status: DC | PRN

## 2021-02-25 MED ORDER — ONDANSETRON HCL 4 MG/2ML IJ SOLN
INTRAMUSCULAR | Status: DC | PRN
  Administered 2021-02-25: 4 mg via INTRAVENOUS

## 2021-02-25 MED ORDER — OXYCODONE HCL 5 MG PO TABS: 5.0000 mg | ORAL_TABLET | Freq: Four times a day (QID) | ORAL | 0 refills | Status: DC | PRN

## 2021-02-25 MED ORDER — MEPERIDINE HCL 25 MG/ML IJ SOLN: 6.2500 mg | INTRAMUSCULAR | Status: DC | PRN

## 2021-02-25 MED ORDER — DIBUCAINE 1 % EX OINT
TOPICAL_OINTMENT | CUTANEOUS | Status: DC | PRN
  Administered 2021-02-25: 1

## 2021-02-25 MED ORDER — ENSURE PRE-SURGERY PO LIQD: 296.0000 mL | Freq: Once | ORAL | Status: DC

## 2021-02-25 MED ORDER — SODIUM CHLORIDE 0.9 % IV SOLN
INTRAVENOUS | Status: AC
  Filled 2021-02-25: qty 100

## 2021-02-25 MED ORDER — CELECOXIB 200 MG PO CAPS
200.0000 mg | ORAL_CAPSULE | ORAL | Status: AC
  Administered 2021-02-25: 200 mg via ORAL

## 2021-02-25 MED ORDER — PROMETHAZINE HCL 25 MG/ML IJ SOLN: 6.2500 mg | INTRAMUSCULAR | Status: DC | PRN

## 2021-02-25 MED ORDER — SCOPOLAMINE 1 MG/3DAYS TD PT72
MEDICATED_PATCH | TRANSDERMAL | Status: AC
  Filled 2021-02-25: qty 1

## 2021-02-25 MED ORDER — OXYCODONE HCL 5 MG PO TABS: 5.0000 mg | ORAL_TABLET | Freq: Once | ORAL | Status: DC | PRN

## 2021-02-25 MED ORDER — PROPOFOL 10 MG/ML IV BOLUS
INTRAVENOUS | Status: AC
  Filled 2021-02-25: qty 20

## 2021-02-25 MED ORDER — GLYCOPYRROLATE PF 0.2 MG/ML IJ SOSY
PREFILLED_SYRINGE | INTRAMUSCULAR | Status: AC
  Filled 2021-02-25: qty 2

## 2021-02-25 MED ORDER — METRONIDAZOLE IN NACL 5-0.79 MG/ML-% IV SOLN
INTRAVENOUS | Status: AC
  Filled 2021-02-25: qty 100

## 2021-02-25 MED ORDER — SODIUM CHLORIDE 0.9 % IV SOLN
2.0000 g | INTRAVENOUS | Status: AC
  Administered 2021-02-25: 2 g via INTRAVENOUS

## 2021-02-25 MED ORDER — ACETAMINOPHEN 500 MG PO TABS
1000.0000 mg | ORAL_TABLET | ORAL | Status: AC
  Administered 2021-02-25: 1000 mg via ORAL

## 2021-02-25 MED ORDER — BUPIVACAINE-EPINEPHRINE 0.25% -1:200000 IJ SOLN
INTRAMUSCULAR | Status: AC
  Filled 2021-02-25: qty 1

## 2021-02-25 MED ORDER — PROPOFOL 10 MG/ML IV BOLUS
INTRAVENOUS | Status: DC | PRN
  Administered 2021-02-25: 200 mg via INTRAVENOUS

## 2021-02-25 MED ORDER — CHLORHEXIDINE GLUCONATE CLOTH 2 % EX PADS: 6.0000 | MEDICATED_PAD | Freq: Once | CUTANEOUS | Status: DC

## 2021-02-25 MED ORDER — BUPIVACAINE LIPOSOME 1.3 % IJ SUSP
INTRAMUSCULAR | Status: DC | PRN
  Administered 2021-02-25: 20 mL

## 2021-02-25 MED ORDER — CELECOXIB 200 MG PO CAPS
ORAL_CAPSULE | ORAL | Status: AC
  Filled 2021-02-25: qty 1

## 2021-02-25 MED ORDER — MIDAZOLAM HCL 5 MG/5ML IJ SOLN
INTRAMUSCULAR | Status: DC | PRN
  Administered 2021-02-25: 2 mg via INTRAVENOUS

## 2021-02-25 MED ORDER — DEXAMETHASONE SODIUM PHOSPHATE 4 MG/ML IJ SOLN
INTRAMUSCULAR | Status: DC | PRN
  Administered 2021-02-25: 8 mg via INTRAVENOUS

## 2021-02-25 MED ORDER — CEFTRIAXONE SODIUM 2 G IJ SOLR
INTRAMUSCULAR | Status: AC
  Filled 2021-02-25: qty 20

## 2021-02-25 MED ORDER — METRONIDAZOLE IN NACL 5-0.79 MG/ML-% IV SOLN
500.0000 mg | INTRAVENOUS | Status: AC
  Administered 2021-02-25: 500 mg via INTRAVENOUS

## 2021-02-25 MED ORDER — FENTANYL CITRATE (PF) 100 MCG/2ML IJ SOLN
INTRAMUSCULAR | Status: DC | PRN
  Administered 2021-02-25 (×2): 50 ug via INTRAVENOUS
  Administered 2021-02-25: 25 ug via INTRAVENOUS

## 2021-02-25 MED ORDER — FENTANYL CITRATE (PF) 100 MCG/2ML IJ SOLN
25.0000 ug | INTRAMUSCULAR | Status: DC | PRN
Start: 2021-02-25 — End: 2021-02-25

## 2021-02-25 MED ORDER — OXYCODONE HCL 5 MG/5ML PO SOLN: 5.0000 mg | Freq: Once | ORAL | Status: DC | PRN

## 2021-02-25 MED ORDER — MIDAZOLAM HCL 2 MG/2ML IJ SOLN
INTRAMUSCULAR | Status: AC
  Filled 2021-02-25: qty 2

## 2021-02-25 MED ORDER — SCOPOLAMINE 1 MG/3DAYS TD PT72
1.0000 | MEDICATED_PATCH | TRANSDERMAL | Status: DC
  Administered 2021-02-25: 1.5 mg via TRANSDERMAL

## 2021-02-25 MED ORDER — BUPIVACAINE LIPOSOME 1.3 % IJ SUSP: 20.0000 mL | Freq: Once | INTRAMUSCULAR | Status: DC

## 2021-02-25 MED ORDER — LIDOCAINE HCL (CARDIAC) PF 100 MG/5ML IV SOSY
PREFILLED_SYRINGE | INTRAVENOUS | Status: DC | PRN
  Administered 2021-02-25: 30 mg via INTRAVENOUS

## 2021-02-25 SURGICAL SUPPLY — 50 items
ADH SKN CLS APL DERMABOND .7 (GAUZE/BANDAGES/DRESSINGS) ×2
BLADE CLIPPER SENSICLIP SURGIC (BLADE) ×1 IMPLANT
BLADE HEX COATED 2.75 (ELECTRODE) ×3 IMPLANT
BLADE SURG 15 STRL LF DISP TIS (BLADE) ×2 IMPLANT
BLADE SURG 15 STRL SS (BLADE) ×3
BRIEF STRETCH FOR OB PAD LRG (UNDERPADS AND DIAPERS) ×3 IMPLANT
COVER BACK TABLE 60X90IN (DRAPES) ×3 IMPLANT
COVER WAND RF STERILE (DRAPES) ×3 IMPLANT
DERMABOND ADVANCED (GAUZE/BANDAGES/DRESSINGS) ×1
DERMABOND ADVANCED .7 DNX12 (GAUZE/BANDAGES/DRESSINGS) IMPLANT
DRAPE HYSTEROSCOPY (MISCELLANEOUS) ×3 IMPLANT
DRAPE SHEET LG 3/4 BI-LAMINATE (DRAPES) ×1 IMPLANT
DRSG PAD ABDOMINAL 8X10 ST (GAUZE/BANDAGES/DRESSINGS) ×6 IMPLANT
ELECT REM PT RETURN 9FT ADLT (ELECTROSURGICAL) ×3
ELECTRODE REM PT RTRN 9FT ADLT (ELECTROSURGICAL) ×2 IMPLANT
GAUZE SPONGE 4X4 12PLY STRL (GAUZE/BANDAGES/DRESSINGS) ×1 IMPLANT
GLOVE SURG ENC MOIS LTX SZ6.5 (GLOVE) ×1 IMPLANT
GLOVE SURG LTX SZ8 (GLOVE) IMPLANT
GLOVE SURG UNDER LTX SZ8 (GLOVE) IMPLANT
GLOVE SURG UNDER POLY LF SZ7 (GLOVE) ×2 IMPLANT
GOWN STRL REUS W/TWL XL LVL3 (GOWN DISPOSABLE) ×3 IMPLANT
IV CATH 14GX2 1/4 (CATHETERS) IMPLANT
IV CATH PLACEMENT 20 GA (IV SOLUTION) IMPLANT
KIT SIGMOIDOSCOPE (SET/KITS/TRAYS/PACK) IMPLANT
KIT TURNOVER CYSTO (KITS) ×3 IMPLANT
LEGGING LITHOTOMY PAIR STRL (DRAPES) ×1 IMPLANT
MANIFOLD NEPTUNE II (INSTRUMENTS) ×1 IMPLANT
NEEDLE HYPO 22GX1.5 SAFETY (NEEDLE) ×3 IMPLANT
NS IRRIG 500ML POUR BTL (IV SOLUTION) ×1 IMPLANT
PACK BASIN DAY SURGERY FS (CUSTOM PROCEDURE TRAY) ×3 IMPLANT
PAD PREP 24X48 CUFFED NSTRL (MISCELLANEOUS) ×3 IMPLANT
PENCIL SMOKE EVACUATOR (MISCELLANEOUS) ×3 IMPLANT
SURGILUBE 2OZ TUBE FLIPTOP (MISCELLANEOUS) ×3 IMPLANT
SUT CHROMIC 2 0 SH (SUTURE) ×3 IMPLANT
SUT CHROMIC 3 0 SH 27 (SUTURE) ×3 IMPLANT
SUT ETHIBOND 0 (SUTURE) IMPLANT
SUT MNCRL AB 4-0 PS2 18 (SUTURE) IMPLANT
SUT PROLENE 2 0 SH DA (SUTURE) IMPLANT
SUT VIC AB 2-0 UR6 27 (SUTURE) ×4 IMPLANT
SUT VIC AB 3-0 SH 18 (SUTURE) IMPLANT
SWAB COLLECTION DEVICE MRSA (MISCELLANEOUS) IMPLANT
SWAB CULTURE ESWAB REG 1ML (MISCELLANEOUS) IMPLANT
SYR 20ML LL LF (SYRINGE) ×3 IMPLANT
SYR BULB IRRIG 60ML STRL (SYRINGE) IMPLANT
SYR CONTROL 10ML LL (SYRINGE) ×1 IMPLANT
TOWEL OR 17X26 10 PK STRL BLUE (TOWEL DISPOSABLE) ×5 IMPLANT
TRAY DSU PREP LF (CUSTOM PROCEDURE TRAY) ×3 IMPLANT
TUBE CONNECTING 12X1/4 (SUCTIONS) ×3 IMPLANT
UNDERPAD 30X36 HEAVY ABSORB (UNDERPADS AND DIAPERS) ×3 IMPLANT
YANKAUER SUCT BULB TIP NO VENT (SUCTIONS) ×5 IMPLANT

## 2021-02-25 NOTE — Anesthesia Postprocedure Evaluation (Signed)
Anesthesia Post Note  Patient: Beth [name]  Procedure(s) Performed: ANORECTAL EXAM UNDER ANESTHESIA (N/A Rectum) TRANSANAL EXCISIONAL  BIOPSY OF ANAL CANAL MASS (N/A Rectum) EXCISION of  LABIAL LESIOM (Left )     Patient location during evaluation: PACU Anesthesia Type: General Level of consciousness: awake and alert, patient cooperative and oriented Pain management: pain level controlled Vital Signs Assessment: post-procedure vital signs reviewed and stable Respiratory status: spontaneous breathing, nonlabored ventilation and respiratory function stable Cardiovascular status: blood pressure returned to baseline and stable Postop Assessment: no apparent nausea or vomiting, able to ambulate and adequate PO intake Anesthetic complications: no   No complications documented.  Last Vitals:  Vitals:   02/25/21 1215 02/25/21 1244  BP: 127/89 (!) 162/106  Pulse: 78 92  Resp: 16 16  Temp:  36.5 C  SpO2: 97% 97%    Last Pain:  Vitals:   02/25/21 1300  TempSrc:   PainSc: 0-No pain                 Biagio Snelson,E. Dayane Hillenburg

## 2021-02-25 NOTE — Discharge Instructions (Signed)
Information for Discharge Teaching: EXPAREL (bupivacaine liposome injectable suspension)   Your surgeon or anesthesiologist gave you EXPAREL(bupivacaine) to help control your pain after surgery.   EXPAREL is a local anesthetic that provides pain relief by numbing the tissue around the surgical site.  EXPAREL is designed to release pain medication over time and can control pain for up to 72 hours.  Depending on how you respond to EXPAREL, you may require less pain medication during your recovery.  Possible side effects:  Temporary loss of sensation or ability to move in the area where bupivacaine was injected.  Nausea, vomiting, constipation  Rarely, numbness and tingling in your mouth or lips, lightheadedness, or anxiety may occur.  Call your doctor right away if you think you may be experiencing any of these sensations, or if you have other questions regarding possible side effects.  Follow all other discharge instructions given to you by your surgeon or nurse. Eat a healthy diet and drink plenty of water or other fluids.  If you return to the hospital for any reason within 96 hours following the administration of EXPAREL, it is important for health care providers to know that you have received this anesthetic. A teal colored band has been placed on your arm with the date, time and amount of EXPAREL you have received in order to alert and inform your health care providers. Please leave this armband in place for the full 96 hours following administration, and then you may remove the band.YOU HAVE CANCER OF THE ANUS (Squamous Cell Cancer or Anus = SCCA)  This usually has a good treatment/response rate with a combination of radiation therapy and chemotherapy.  I will work to try and help set up appointments to see those specialist at the cancer center to get a treatment plan started and get this under control.  In the meantime, see the rectal surgery instructions below to control  pain.  Keep appointment to follow-up with me in a few weeks to make sure you are recovering from the biopsies.  ######################################  Post Anesthesia Home Care Instructions  Activity: Get plenty of rest for the remainder of the day. A responsible individual must stay with you for 24 hours following the procedure.  For the next 24 hours, DO NOT: -Drive a car -Paediatric nurse -Drink alcoholic beverages -Take any medication unless instructed by your physician -Make any legal decisions or sign important papers.  Meals: Start with liquid foods such as gelatin or soup. Progress to regular foods as tolerated. Avoid greasy, spicy, heavy foods. If nausea and/or vomiting occur, drink only clear liquids until the nausea and/or vomiting subsides. Call your physician if vomiting continues.  Special Instructions/Symptoms: Your throat may feel dry or sore from the anesthesia or the breathing tube placed in your throat during surgery. If this causes discomfort, gargle with warm salt water. The discomfort should disappear within 24 hours.  If you had a scopolamine patch placed behind your ear for the management of post- operative nausea and/or vomiting:  1. The medication in the patch is effective for 72 hours, after which it should be removed.  Wrap patch in a tissue and discard in the trash. Wash hands thoroughly with soap and water. 2. You may remove the patch earlier than 72 hours if you experience unpleasant side effects which may include dry mouth, dizziness or visual disturbances. 3. Avoid touching the patch. Wash your hands with soap and water after contact with the patch.     ANORECTAL SURGERY:  POST OPERATIVE INSTRUCTIONS  ######################################################################  EAT Start with a pureed / full liquid diet After 24 hours, gradually transition to a high fiber diet.    CONTROL PAIN Control pain so you can tolerate bowel movements,   walk, sleep, tolerate sneezing/coughing, and go up/down stairs.   HAVE A BOWEL MOVEMENT DAILY Keep your bowels regular to avoid problems.   Taking a fiber supplement every day to keep bowels soft.   Try a laxative to override constipation. Use an antidairrheal to slow down diarrhea.   Call if not better after 2 tries  WALK Walk an hour a day.  Control your pain to do that.   CALL IF YOU HAVE PROBLEMS/CONCERNS Call if you are still struggling despite following these instructions. Call if you have concerns not answered by these instructions  ######################################################################    1. Take your usually prescribed home medications unless otherwise directed.  2. DIET: Follow a light bland diet & liquids the first 24 hours after arrival home, such as soup, liquids, starches, etc.  Be sure to drink plenty of fluids.  Quickly advance to a usual solid diet within a few days.  Avoid fast food or heavy meals as your are more likely to get nauseated or have irregular bowels.  A low-fat, high-fiber diet for the rest of your life is ideal.  3. PAIN CONTROL: a. Pain is best controlled by a usual combination of three different methods TOGETHER: i. Ice/Heat ii. Over the counter pain medication iii. Prescription pain medication b. Expect swelling and discomfort in the anus/rectal area.  Warm water baths (30-60 minutes up to 6 times a day, especially after bowel meovements) will help. Use ice for the first few days to help decrease swelling and bruising, then switch to heat such as warm towels, sitz baths, warm baths, etc to help relax tight/sore spots and speed recovery.  Some people prefer to use ice alone, heat alone, alternating between ice & heat.  Experiment to what works for you.   c. It is helpful to take an over-the-counter pain medication continuously for the first few weeks.  Choose one of the following that works best for you: i. Naproxen (Aleve, etc)  Two  220mg  tabs twice a day ii. Ibuprofen (Advil, etc) Three 200mg  tabs four times a day (every meal & bedtime) iii. Acetaminophen (Tylenol, etc) 500-650mg  four times a day (every meal & bedtime) d. A  prescription for pain medication (such as oxycodone, hydrocodone, etc) should be given to you upon discharge.  Take your pain medication as prescribed.  i. If you are having problems/concerns with the prescription medicine (does not control pain, nausea, vomiting, rash, itching, etc), please call us [phone] to see if we need to switch you to a different pain medicine that will work better for you and/or control your side effect better. ii. If you need a refill on your pain medication, please contact your pharmacy.  They will contact our office to request authorization. Prescriptions will not be filled after 5 pm or on week-ends.  If can take up to 48 hours for it to be filled & ready so avoid waiting until you are down to thel ast pill. e. A topical cream (Dibucaine) or a prescription for a cream (such as diltiazem 2% gel) may be given to you.  Many people find relief with topical creams.  Some people find it burns too much.  Experiment.  If it helps, use it.  If it burns, don't using it.  Use a Sitz Bath 4-8 times a day for relief   [employer] A sitz bath is a warm water bath taken in the sitting position that covers only the hips and buttocks. It may be used for either healing or hygiene purposes. Sitz baths are also used to relieve pain, itching, or muscle spasms. The water may contain medicine. Moist heat will help you heal and relax.  HOME CARE INSTRUCTIONS  Take 3 to 4 sitz baths a day. 1. Fill the bathtub half full with warm water. 2. Sit in the water and open the drain a little. 3. Turn on the warm water to keep the tub half full. Keep the water running constantly. 4. Soak in the water for 15 to 20 minutes. 5. After the sitz bath, pat the affected area dry first.   4. KEEP YOUR BOWELS  REGULAR a. The goal is one soft bowel movement a day b. Avoid getting constipated.  Between the surgery and the pain medications, it is common to experience some constipation.  Increasing fluid intake and taking a fiber supplement (such as Metamucil, Citrucel, FiberCon, MiraLax, etc) 2-3 times a day regularly will usually help prevent this problem from occurring.  A mild laxative (prune juice, Milk of Magnesia, MiraLax, etc) should be taken according to package directions if there are no bowel movements after 48 hours. c. Watch out for diarrhea.  If you have many loose bowel movements, simplify your diet to bland foods & liquids for a few days.  Stop any stool softeners and decrease your fiber supplement.  Switching to mild anti-diarrheal medications (Kayopectate, Pepto Bismol) can help.  Can try an imodium/loperamide dose.  If this worsens or does not improve, please call us.  5. Wound Care  a. Remove your bandages with your first bowel movement, usually the day after surgery.  Let the gauze fall off with the first bowel movement or shower.   b. Wear an absorbent pad or soft cotton balls in your underwear as needed to catch any drainage and help keep the area  c. Keep the area clean and dry.  Bathe / shower every day.  Keep the area clean by showering / bathing over the incision / wound.   It is okay to soak an open wound to help wash it.  Consider using a squeeze bottle filled with warm water to gently wash the anal area.  Wet wipes or showers / gentle washing after bowel movements is often less traumatic than regular toilet paper. d. Dennis Bast will often notice bleeding with bowel movements.  This should slow down by the end of the first week of surgery.  Sitting on an ice pack can help. e. Expect some drainage.  This should slow down by the end of the first week of surgery, but you will have occasional bleeding or drainage up to a few months after surgery.  Wear an absorbent pad or soft cotton gauze in your  underwear until the drainage stops.  6. ACTIVITIES as tolerated:   a. You may resume regular (light) daily activities beginning the next day--such as daily self-care, walking, climbing stairs--gradually increasing activities as tolerated.  If you can walk 30 minutes without difficulty, it is safe to try more intense activity such as jogging, treadmill, bicycling, low-impact aerobics, swimming, etc. b. Save the most intensive and strenuous activity for last such as sit-ups, heavy lifting, contact sports, etc  Refrain from any heavy lifting or straining until you are off narcotics for pain control.  c. DO NOT PUSH THROUGH PAIN.  Let pain be your guide: If it hurts to do something, don't do it.  Pain is your body warning you to avoid that activity for another week until the pain goes down. d. You may drive when you are no longer taking prescription pain medication, you can comfortably sit for long periods of time, and you can safely maneuver your car and apply brakes. e. Dennis Bast may have sexual intercourse when it is comfortable.  7. FOLLOW UP in our office a. Please call [facility] at [phone] to set up an appointment to see your surgeon in the office for a follow-up appointment approximately 2-3 weeks after your surgery. b. Make sure that you call for this appointment the day you arrive home to ensure a convenient appointment time.  8. IF YOU HAVE DISABILITY OR FAMILY LEAVE FORMS, BRING THEM TO THE OFFICE FOR PROCESSING.  DO NOT GIVE THEM TO YOUR DOCTOR.        WHEN TO CALL us [phone]: 1. Poor pain control 2. Reactions / problems with new medications (rash/itching, nausea, etc)  3. Fever over 101.5 F (38.5 C) 4. Inability to urinate 5. Nausea and/or vomiting 6. Worsening swelling or bruising 7. Continued bleeding from incision. 8. Increased pain, redness, or drainage from the incision  The clinic staff is available to answer your questions during regular business hours (8:30am-5pm).   Please don't hesitate to call and ask to speak to one of our nurses for clinical concerns.   A surgeon from [facility] is always on call at the hospitals   If you have a medical emergency, go to the nearest emergency room or call 911.    [facility], [place], [place], [place],   [address] ? MAIN: [phone] ? TOLL FREE: [phone] ? FAX [phone] [email or web address]   ##########################################  ANAL CANCER OVERVIEW   Anal cancer is an abnormal growth of cells in or around the anus or anal canal, the short passage through which bowel movements pass. The most common type of cancer found in this location is believed to be related to a type of viral infection linked to causing other types of cancers as well. Anal cancers are usually treated with radiation and chemotherapy, but surgery alone may be useful for very small or early anal cancers or when other therapy is not an option or unsuccessful in treating the anal cancer. Assessment for cancer spread and close follow up are necessary when treating anal cancer.  This summary is intended for anyone wishing to learn more about anal cancer. After reading this summary, the reader should understand the following:  The definition of anal cancer and where it develops.  How frequently anal cancer occurs in the [place] and some of the risk factors that put people at increased risk for developing anal cancer.  How to prevent anal cancer.  The symptoms that may be associated with anal cancer.  How to diagnose, stage, and treat anal cancer.  How to follow patients who have been diagnosed with anal cancer. DEFINITION OF ANAL CANCER  Cancer describes a set of diseases in which normal cells in the body, lose their ability to control their growth. As cancers - also known as "malignancies" - grow, they may invade the tissues around them (local invasion). They may  also spread to other locations in the body via the blood vessels or lymphatic channels where they may implant and grow. This  type of cancer spread is also called metastases.  The anus or anal canal is the short passage that is the opening through which stool or feces passes to exit the body at the time of a bowel movement.   Anal cancer arises from the cells around the anal opening or in the anal canal just inside the anal opening. Anal cancer is often a type of cancer called "squamous cell carcinoma" (which includes cancers called basaloid, epidermoid, cloacogenic, or mucoepidermoid - all of which are assessed and treated the same way). Other rare types of cancer may also occur in the anal canal (like gastrointestinal stromal tumors or "GIST" and melanoma to name two), and these require consultation with your physician or surgeon to determine the appropriate evaluation and treatment. Cancer can also develop in the skin in the 5 cm, or approximately 2 inches, just outside the anus. This is called perianal or anal margin cancer and is treated more like a skin cancer.  Cells that are becoming malignant or "premalignant," but have not invaded deeper into the skin are often referred to as "high-grade anal intraepithelial neoplasia" or HGAIN (previously referred to by a number of different terms, including "high grade dysplasia," "carcinoma-in-situ," "anal intra-epithelial neoplasia grade III," "high-grade squamous intraepithelial lesion," or "Bowen's disease"). While this condition is likely a precursor to anal cancer, this is not anal cancer and is treated differently than anal cancer. Your physician or colon and rectal surgeon can help clarify the differences. The risk of these types of premalignant cells turning into cancer is unknown, but is thought to be low, especially in patients with a normal immune system.  ANAL CANCER RATES IN THE [place]  Anal cancer is fairly uncommon, accounting for about 1-2% of all  cancers affecting the intestinal tract. Approximately one in 64 men and women will get anal cancer in their lifetime (compared to 1 in 61 men and women who will develop colon and rectal cancer in their lifetime). Almost 6,000 new cases of anal cancer are now diagnosed each year in the [place] about 2/3rds of the cases in women. Approximately 800 people will die of the disease each year.  RISK FACTORS FOR ANAL CANCER  A risk factor is something that increases a person's chance of getting a disease. Anal cancer is commonly associated with infection with the human papilloma virus (HPV), the most common sexually transmitted disease. There are a number of different types of HPV, some more likely to be associated with development of cancer than others. The types of HPV associated with development of cancer usually lead to long-standing and subclinical infection (one that does not show outside evidence of the HPV infection or symptoms/warts) of the tissues in and around the anus as well as in other areas. These types of HPV are associated with the premalignant changes that were described above (high-grade anal intraepithelial neoplasia or HGAIN). These types of HPV are also associated with an increased risk of cervical, vulvar, and vaginal cancer in women, penile cancer in men, as well as with some head and neck cancers in men and women. Having a squamous cell cancer of the genitals, especially cervical or vulvar cancer (or even pre-cancer of the cervix or vulva), can put people at increased risk for anal cancer - likely from the association with the cancer-causing types of HPV infection.  Interestingly, patients with anal cancer do not appear to be at increased risk for colon and rectal cancer or other cancers in the abdominal organs. It should  be noted that not all anal cancers are associated with HPV infection. Some develop without a clear reason.  Additional risk factors for anal cancer include:  Age - While most  cases of anal cancer develop in people over age 45, over 1/3rd of cases occur in patients younger than that. In the [place] between 2004 and 2008, the numbers of anal cancers diagnosed by age: 26.0% were diagnosed under age 33; 1.1% between 78 and 59; 9.4% between 42 and 66; 24.7% between 60 and 7; 25.0% between 46 and 20; 18.0% between 14 and 74; 15.2% between 23 and 89; and 60.80% 89+ years of age. As noted above, the incidence of anal cancer in younger men, likely related to HIV (human immunodeficiency virus, the virus that leads to AIDS) infection, has been increasing in some parts of the world.  Anal sex - People participating in anal sex, both men and women, are at increased risk.  Sexually transmitted diseases - Patients with multiple sex partners are at higher risk of getting sexually transmitted diseases like HPV and HIV and are, therefore, at increased risk of developing anal cancer.  Smoking - Harmful chemicals from smoking increases the risk of most cancers, including anal cancer.  Immunosuppression - People with weakened immune systems, such as transplant patients taking drugs to suppress their immune systems and patients with HIV infection, are at higher risk.  Chronic local inflammation - People with long-standing anal fistulas or open wounds in the anal area are at a slightly higher risk of developing cancer in the area of the inflammation.  Pelvic radiation - People with previous pelvic radiation therapy for rectal, prostate, bladder or cervical cancer are at increased risk. PREVENTING ANAL CANCER  Few cancers can be totally prevented, but the risk of developing anal cancer may be decreased significantly by avoiding the risk factors listed above and by getting regular checkups. Smoking cessation lowers the risk of many types of cancer, including anal cancer. Avoiding anal sex and infection with HPV and HIV can reduce the risk of developing anal cancer. Using condoms whenever having any  kind of intercourse may reduce, but not eliminate, the risk of HPV infection. Condoms do not completely prevent transmission of HPV because the virus is spread by skin-to-skin contact and can live in areas not covered by a condom.  Vaccines originally used in clinical trials against the HPV types that have been associated with cervical cancer have also been shown to decrease the risk of developing HGAIN and anal cancer (in men and women), especially in those patients at higher risk (see the risk factors listed above). These vaccines are most effective in people who have not previously been sexually active or have not yet been infected with HPV. The best types of HPV vaccines, how often they need to be given, and who should receive the vaccines remains a matter of discussion.  People who are at increased risk for anal cancer based on the risk factors listed above should talk to their doctors about consideration for anal cancer screening, although this has not been clearly shown to improve outcomes in all patients. This screening can include anal cytology (study of anal cells under a microscope after using a swab on the anal tissues), also known as Pap tests (much like the Pap tests women undergo for cervical cancer screening) as well as use of specialized small, lighted scopes of the anus with high magnification ("high resolution anoscopy" or HRA) that can be used in the clinic or  the operating room to assess for premalignant or malignant changes in the anus.  Early identification and treatment of premalignant lesions in the anus may prevent the development of anal cancer. It is unclear how successful these screening tests, and any subsequent treatments, are at preventing cancers or saving lives from the prevention of anal cancer, but this likelihood is thought to be very similar to the improvements found with the use of cervical cancer screening in women using the pelvic exam Pap test. It is unclear how often the  anal Pap tests or HRA should be done to successfully identify and prevent anal cancer.  If premalignant changes of the anus or HGAIN are identified, they can be treated through a number of methods to hopefully prevent them from developing into anal cancer, although not all of these changes will actually develop into cancer (the rate of cancer developing in HGAIN is thought to be less than 10% in people with a normal immune system but may be as high as 50% in patients with HIV). Treatment methods include excision (cutting out the abnormal tissue), using electrocautery (focused application of electricity), laser or infrared treatments, phototherapy treatments, radiation treatments, chemotherapy creams (5-fluorouracil or 5-FU), or medications (for example, Imiquimod).  None of these treatments has been studied extensively; none of them is effective 100% of the time; all of them have potential side effects; and all of them require long-term follow-up to confirm success of the treatment. It is best to consult with your physician or colon and rectal surgeon before considering these treatments for premalignant changes of the anus. Remember, these treatments for premalignant changes or HGAIN differ from those for anal cancer (which are described below).  ANAL CANCER SYMPTOMS  While up to 20% of patients with anal cancers may not have any symptoms, many cases of anal cancer can be found early because they form in a part of the digestive tract the doctor can reach and see easily. Unfortunately, sometimes symptoms don't become evident until the cancer has grown or spread, so it is important to be aware of the symptoms associated with anal cancer, so the cancer may be caught early and without delay. Anal cancers often cause symptoms such as:  Bleeding from the rectum or anus  The feeling of a lump or mass at the anal opening  Persistent or recurring pain in the anal area  Persistent or recurrent itching  Change in  bowel habits (having more or fewer bowel movements) or increased straining during a bowel movement  Narrowing of the stools  Discharge or drainage (mucous or pus) from the anus  Swollen lymph nodes (glands) or pain in the anal or groin areas These symptoms can also be caused by less serious conditions such as hemorrhoids, but you should never assume this. More than 50% of anal cancers have a delayed diagnosis or misdiagnosis because of the symptoms being mistaken for some other problem (or because the cancer did not have any symptoms). If you have any of the symptoms listed above, see your doctor or colon and rectal surgeon.  DIAGNOSING ANAL CANCER  Anal cancer is usually found on examination of the anal canal because of the presence of symptoms like those listed above. It can also be found on routine yearly physical exams by a physician.  Photo Courtesy of the [employer]  [Digital rectal exam; drawing shows a side view of the female reproductive and urinary anatomy, including the prostate, rectum, and bladder; also shows a gloved and lubricated finger  inserted into the rectum to feel the prostate.]  On screening tests such as those recommended for preventing or diagnosing colorectal cancer (for example: yearly stool blood tests or a lighted scope exam of the colon and rectum, also known as "colonoscopy", or even at the time of other anal surgery (such as removal of a hemorrhoid). There are no blood tests to diagnose anal cancer at this time.  Once there is concern that there may be an abnormal mass in the anus, other tests may be performed to try to diagnose what the mass is.  Anoscopy, or exam of the anal canal with a small, lighted scope, may be performed to visualize any abnormal findings. If an abnormal area is confirmed, a biopsy may be performed to determine the exact diagnosis. This biopsy may be performed in the clinic with the help of a local anesthetic or may be performed in the operating room with  anesthesia. If the diagnosis of anal cancer is confirmed, additional tests to determine the extent of the cancer may be recommended. These tests help to stage the cancer in the anus and, thus, help the physician determine a prognosis.  STAGING ANAL CANCER  The stage of a cancer is how advanced the cancer is both in terms of local growth (size of the tumor and whether it has grown into other important structures) and distant spread ("metastasis" or spread into lymph nodes or other structures in the body). Once the cancer is diagnosed, the physician may perform a number of tests to determine the stage of the cancer. Staging helps determine the likelihood that a person may survive the cancer and helps to determine what treatments should be recommended. When anal cancer spreads, it tends to spread to groin lymph nodes or lymph nodes in the abdomen or to other organs like the liver, lungs, and bones. In approximately 15%-30% of patients diagnosed with anal cancer, it will already have spread to the lymph nodes, and in 10-17% it will have spread to other organs.  Courtesy of Jacki Cones  The cancer is first staged by physical exam when the doctor examines the anal canal to assess the size of the tumor and where it is growing. They also check to see if they can feel any abnormally large lymph nodes in the groin or elsewhere around the body, and may even use a small needle to biopsy any abnormal nodes (called an "FNA" or fine needle aspiration). A pelvic exam should be performed in all women with anal cancer due to the association with cervical and vaginal cancers. Colonoscopy is recommended for patients with anal cancers who have not yet had one but should have one based on their age or other risk factors for colon and rectal cancer, as determined by their doctor.  Another test may include an ultrasound of the anus (endorectal or endoanal ultrasound) or MRI to assess the cancer's size, growth into other structures, and  whether the nodes around it look enlarged. X-ray tests like a chest X-ray and/or CT scans of the pelvis, abdomen, and/or chest (and/or head if there are concerning symptoms) may be used to look for cancer spread elsewhere in the body. Finally, some patients may undergo a PET scan to look for cancer spread in the body, especially if there are unclear areas of concern on the CT.  TREATING ANAL CANCER  Treatment for most cases of anal cancer is very effective in curing the cancer. There are 3 types of treatment used for anal cancer:  Surgery: This is when an operation is performed to remove the cancer. Occasionally a very small or early tumor may be removed surgically ("local excision") without the need for further treatment and with minimal damage to the anal sphincter muscles that are important for bowel control. Sometimes more major surgery to remove the anal cancer is needed, and this may require removal of the anus and rectum and the muscles that are necessary for bowel control, with creation of a permanent colostomy (where the large bowel or colon is brought out to the skin on the belly wall and a bag is then attached with adhesive to the skin to collect the fecal matter).  Photo Courtesy of the [employer]  (TermTop.com.au.aspx?imageName=/[email or web address]&caption=Colon cancer surgery with colostomy.)  Part of the colon containing the cancer and nearby healthy tissue is removed, a stoma is created, and a colostomy bag is attached to the stoma.  This is called an abdominoperineal resection or APR, and it used to be the main treatment for anal cancer until the 1970's when radiation and chemotherapy were found to be successful in treating it. The operation is still used when other treatments fail and the cancer persists (still present locally in the anus within 6 months of completing treatments) or recurs (cancer develops again locally in the anus more than 6 months  after having appeared to have been successfully treated), or when patients are not a candidate for other treatment options.  Complication risks after major surgery are higher, especially problems with incisional healing and infection in up to 80% of patients, after they have had their anal cancer treated with chemotherapy and radiation. While quality of life may be adversely affected by having a colostomy, many patients live normal lives, maintain an active lifestyle, and work while having a colostomy. Surgery can sometimes also be used to remove areas of anal cancer spread, including in groin lymph nodes and other organs, but the success of this type of surgery in curing anal cancer is not as good. Not all patients are candidates for undergoing an operation, especially if other health conditions make surgery unsafe.  Radiation Therapy: This is when high-dose X-rays are used to kill the cancer cells. Anal cancer is a type of cancer that is very sensitive to radiation, which means it responds well to this type of treatment (especially when chemotherapy is used in addition to the radiation). Usually the cancer is treated as well as the groin areas to try to treat any possible cancer cells that may have spread to the lymph nodes in that region. Complications from radiation occur in up to 40-60% of patients and may include skin damage, narrowing of the anal canal from scarring, anal or rectal ulcers, diarrhea, urgency to have bowel movements or even incontinence (inability to control the bowels), bladder inflammation, or small bowel blockages from radiation damage. There may also be risks of developing other types of cancers secondary to the radiation therapy, but the level of these risks is not known. Any of the radiation complications can occur in both the short- and long-term after undergoing treatments. Some newer types of radiation therapy (for example, intensity-modulated radiation therapy or IMRT) may be  considered to try to decrease radiation side effects.  Chemotherapy: This is when medications are given, usually intravenously (directly into a vein) in the case of anal cancer, to kill cancer cells. These treatments were found to provide an added benefit to the use of radiation and improve the likelihood of avoiding the need for surgery  for anal cancer. Drugs that are commonly used for anal cancer include 5-fluorouracil or 5-FU, mitomycin C, and cisplatin. These medications have some common side effects that include nausea, vomiting, diarrhea, hair loss, decrease of the bone marrow to produce immune and blood cells, lung inflammation or lung scarring, or changes in nerve function of the hands and feet. Even death can occur from the use of these medications, but this is rare (less than 5% risk) and usually occurs in patients with other health conditions prior to treatment.  Combination therapy including radiation and chemotherapy is now considered the standard treatment for most anal cancers. Although this combination may have higher risks of side effects, it has shown the best long-term survival rate from anal cancer, with up to 70-90% of patients still alive and cancer free at 5 years after completing treatments. If the cancer recurs or persists, however, surgery such as an APR can still potentially cure the cancer but not as successfully (studies show anywhere from 24% to 69% of patients are cancer-free and alive at 5 years from surgery after failing chemotherapy and radiation). The majority of patients with anal cancer are able to avoid the need for a permanent colostomy, however.  Another option for anal cancer is not to undergo any treatment. If no treatment is given for anal cancer, the cancer will continue to grow and likely spread. If the cancer grows locally, it may cause bowel blockage, abnormal connections ("fistula") between the anus and other organs (for example, the vagina or bladder), pain as it  grows into nerves or other structures, bleeding, bowel incontinence, or other symptoms. Once the cancer has spread, most patients will not live longer than 1-2 years without treatment. Pain medications, palliative surgical procedures, and supportive care can often help to keep patients comfortable from the effects of advanced anal cancer.  FOLLOW-UP AFTER TREATING ANAL CANCER  Follow-up care to assess the results of treatment and to check for recurrence is very important. Most anal cancers are cured with combination therapy and/or surgery as noted above. In addition, some cancers that recur despite treatment may be successfully treated with surgery if they are caught early, so patients are encouraged to report any concerning symptoms to their treating physicians right away. A careful examination of the anus with finger exam and (if needed) anoscopy as well as physical exam by an experienced physician or colon and rectal surgeon at regular intervals is the most important method of follow-up. These exams are recommended every 3-6 months for the first 2 years after the diagnosis, then every 6-12 months for up to 5 years, and then yearly thereafter. Additional studies may also be recommended including endorectal ultrasounds, CT scans, MRI scans, chest X-rays and/or PET scans, especially if there is concern for cancer recurrence.  CONCLUSION  Anal cancers are unusual tumors arising from the skin or lining of the anal canal. As with most cancers, early detection and appropriate treatment is associated with a high likelihood of surviving the cancer. Most tumors are well-treated with a combination of chemotherapy and radiation. In the circumstance where the cancer recurs despite treatment, the cancer may be treated successfully with surgery. It is recommended to follow screening examinations for anal and colorectal cancer and consult your doctor or colon and rectal surgeon early when any concerning symptoms occur.

## 2021-02-25 NOTE — Interval H&P Note (Signed)
History and Physical Interval Note:  02/25/2021 9:59 AM  Beth [name]  has presented today for surgery, with the diagnosis of ANAL CANAL MASS WITH AIN III.  The various methods of treatment have been discussed with the patient and family. After consideration of risks, benefits and other options for treatment, the patient has consented to  Procedure(s) with comments: ANORECTAL EXAM UNDER ANESTHESIA (N/A) - TRANSANAL EXCISION OR BIOPSY OF ANAL CANAL MASS (N/A) as a surgical intervention.  The patient's history has been reviewed, patient examined, no change in status, stable for surgery.  I have reviewed the patient's chart and labs.  Questions were answered to the patient's satisfaction.     I have re-reviewed the the patient's records, history, medications, and allergies.  I have re-examined the patient.  I again discussed intraoperative plans and goals of post-operative recovery.  The patient agrees to proceed.  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 08/30/2016 10/07/2016   Tobacco abuse 08/08/2016   Chronic anal fissure s/p partial sphincterotomy 08/30/2016 08/08/2016   Constipation, chronic 08/08/2016    Past Medical History:  Diagnosis Date   Acne    on face spirolactone for   Constipation, chronic 08/08/2016   History of migraine    none in years   Wears glasses     Past Surgical History:  Procedure Laterality Date   ABDOMINAL  HYSTERECTOMY  2016   partial   COLONOSCOPY     COLONOSCOPY WITH PROPOFOL N/A 01/28/2021   Procedure: COLONOSCOPY WITH PROPOFOL;  Surgeon: Virgel Manifold, MD;  Location: [facility] END[facility];  Service: Endoscopy;  Laterality: N/A;   SPHINCTEROTOMY N/A 08/30/2016   Procedure: LATERAL INTERNAL AND SPHINCTEROTOMY;  Surgeon: Michael Boston, MD;  Location: [facility];  Service: General;  Laterality: N/A;   TONSILLECTOMY  as child    Social History   Socioeconomic History   Marital status: Married    Spouse name: Not on file   Number of children: Not on file   Years of education: Not on file   Highest education level: Not on file  Occupational History   Not on file  Tobacco Use   Smoking status: Current Every Day Smoker    Packs/day: 1.00    Years: 8.00    Pack years: 8.00    Types: Cigarettes   Smokeless tobacco: Never Used  Vaping Use   Vaping Use: Never used  Substance and Sexual Activity   Alcohol use: Yes    Comment: occ wine    Drug use: No   Sexual activity: Yes    Birth control/protection: Surgical    Comment: Hysterectomy  Other Topics Concern   Not on file  Social History Narrative   Not on file   Social Determinants of Health   Financial Resource Strain: Not on file  Food Insecurity: Not on file  Transportation Needs: Not on file  Physical Activity: Not on file  Stress: Not on file  Social Connections: Not on file  Intimate  Partner Violence: Not on file    Family History  Problem Relation Age of Onset   Breast cancer Paternal Aunt     Medications Prior to Admission  Medication Sig Dispense Refill Last Dose   cetirizine (ZYRTEC) 10 MG tablet Take 10 mg by mouth daily as needed for allergies.   Past Week at Unknown time   spironolactone (ALDACTONE) 100 MG tablet Take 100 mg by mouth daily. Takes at 1200 pm takes for facial acne   02/23/2021 at Unknown time    Current Facility-Administered Medications  Medication Dose Route Frequency Provider Last Rate Last  Admin   0.9 %  sodium chloride infusion   Intravenous Continuous Ellender, Karyl Kinnier, MD 10 mL/hr at 02/25/21 0937 New Bag at 02/25/21 0937   bupivacaine liposome (EXPAREL) 1.3 % injection 266 mg  20 mL Infiltration Once Michael Boston, MD       cefTRIAXone (ROCEPHIN) 2 g in sodium chloride 0.9 % 100 mL IVPB  2 g Intravenous On Call to OR Michael Boston, MD       And   metroNIDAZOLE (FLAGYL) IVPB 500 mg  500 mg Intravenous On Call to OR Michael Boston, MD       Chlorhexidine Gluconate Cloth 2 % PADS 6 each  6 each Topical Once Michael Boston, MD       And   Chlorhexidine Gluconate Cloth 2 % PADS 6 each  6 each Topical Once Michael Boston, MD       Derrill Memo ON 02/26/2021] feeding supplement (ENSURE PRE-SURGERY) liquid 296 mL  296 mL Oral Once Michael Boston, MD       scopolamine (TRANSDERM-SCOP) 1 MG/3DAYS 1.5 mg  1 patch Transdermal Q72H Annye Asa, MD   1.5 mg at 02/25/21 4270     Allergies  Allergen Reactions   Clindamycin/Lincomycin Rash   Sulfa Drugs Cross Reactors Itching and Rash    BP (!) 136/99   Pulse 86   Temp 97.7 F (36.5 C) (Oral)   Resp 15   Ht 5\' 7"  (1.702 m)   Wt 72 kg   LMP 03/13/2012   SpO2 100%   BMI 24.86 kg/m   Labs: Results for orders placed or performed during the hospital encounter of 02/25/21 (from the past 48 hour(s))  I-STAT, chem 8     Status: Abnormal   Collection Time: 02/25/21  9:34 AM  Result Value Ref Range   Sodium 139 135 - 145 mmol/L   Potassium 3.7 3.5 - 5.1 mmol/L   Chloride 102 98 - 111 mmol/L   BUN 8 6 - 20 mg/dL   Creatinine, Ser 0.50 0.44 - 1.00 mg/dL   Glucose, Bld 122 (H) 70 - 99 mg/dL    Comment: Glucose reference range applies only to samples taken after fasting for at least 8 hours.   Calcium, Ion 1.28 1.15 - 1.40 mmol/L   TCO2 24 22 - 32 mmol/L   Hemoglobin 16.0 (H) 12.0 - 15.0 g/dL   HCT 47.0 (H) 36.0 - 46.0 %    Imaging / Studies: No results found.   [name], M.D., F.A.C.S. Gastrointestinal and Minimally  Invasive Surgery [facility] [address] [place], [place] [address] [phone] Main / Paging  02/25/2021 10:09 AM    [name]

## 2021-02-25 NOTE — Anesthesia Procedure Notes (Signed)
Procedure Name: LMA Insertion Date/Time: 02/25/2021 10:40 AM Performed by: Georgeanne Nim, CRNA Pre-anesthesia Checklist: Patient identified, Patient being monitored, Emergency Drugs available, Timeout performed and Suction available Patient Re-evaluated:Patient Re-evaluated prior to induction Oxygen Delivery Method: Circle System Utilized Preoxygenation: Pre-oxygenation with 100% oxygen Induction Type: IV induction Ventilation: Mask ventilation without difficulty LMA: LMA inserted LMA Size: 4.0 Number of attempts: 1 Placement Confirmation: positive ETCO2 and breath sounds checked- equal and bilateral

## 2021-02-25 NOTE — Anesthesia Preprocedure Evaluation (Addendum)
Anesthesia Evaluation  Patient identified by MRN, date of birth, ID band Patient awake    Reviewed: Allergy & Precautions, NPO status , Patient's Chart, lab work & pertinent test results  History of Anesthesia Complications Negative for: history of anesthetic complications  Airway Mallampati: II  TM Distance: >3 FB Neck ROM: Full    Dental  (+) Chipped, Dental Advisory Given   Pulmonary Current SmokerPatient did not abstain from smoking.,  02/22/2021 SARS coronavirus NEG   breath sounds clear to auscultation       Cardiovascular (-) anginanegative cardio ROS   Rhythm:Regular Rate:Normal     Neuro/Psych negative neurological ROS     GI/Hepatic Neg liver ROS, Anal abnormality, anal fissure   Endo/Other  negative endocrine ROS  Renal/GU negative Renal ROS     Musculoskeletal   Abdominal   Peds  Hematology negative hematology ROS (+)   Anesthesia Other Findings   Reproductive/Obstetrics S/p hysterectomy                            Anesthesia Physical Anesthesia Plan  ASA: II  Anesthesia Plan: General   Post-op Pain Management:    Induction: Intravenous  PONV Risk Score and Plan: 2 and Ondansetron, Dexamethasone and Scopolamine patch - Pre-op  Airway Management Planned: LMA  Additional Equipment: None  Intra-op Plan:   Post-operative Plan:   Informed Consent: I have reviewed the patients History and Physical, chart, labs and discussed the procedure including the risks, benefits and alternatives for the proposed anesthesia with the patient or authorized representative who has indicated his/her understanding and acceptance.     Dental advisory given  Plan Discussed with: CRNA and Surgeon  Anesthesia Plan Comments:        Anesthesia Quick Evaluation

## 2021-02-25 NOTE — Transfer of Care (Signed)
Immediate Anesthesia Transfer of Care Note  Patient: Beth [name]  Procedure(s) Performed: ANORECTAL EXAM UNDER ANESTHESIA (N/A Rectum) TRANSANAL EXCISIONAL  BIOPSY OF ANAL CANAL MASS (N/A Rectum) EXCISION of  LABIAL LESIOM (Left )  Patient Location: PACU  Anesthesia Type:General  Level of Consciousness: awake, alert  and oriented  Airway & Oxygen Therapy: Patient Spontanous Breathing and Patient connected to face mask oxygen  Post-op Assessment: Report given to RN and Post -op Vital signs reviewed and stable  Post vital signs: Reviewed and stable  Last Vitals:  Vitals Value Taken Time  BP 112/74   Temp    Pulse 87 02/25/21 1149  Resp 10 02/25/21 1149  SpO2 99 % 02/25/21 1149  Vitals shown include unvalidated device data.  Last Pain:  Vitals:   02/25/21 0915  TempSrc: Oral  PainSc: 0-No pain         Complications: No complications documented.

## 2021-02-25 NOTE — Progress Notes (Signed)
Pt alert.  I updated her Dx w husband at bedside.  [facility] Dx w need for [place] referral for probable Joretta Bachelor.  She wishes to have care in [place].  I updated the patient's status to the patient and spouse  Recommendations were made.  Questions were answered.  They expressed understanding & appreciation.  [name], MD, FACS, MASCRS  Gastrointestinal and Minimally Invasive Surgery  [facility] [address], [place],  [address] [phone] Fax [phone] Main/Paging  CONTACT INFORMATION: Weekday (9AM-5PM) concerns: Call [facility] main office at [phone] Weeknight (5PM-9AM) or Weekend/Holiday concerns: Check [email or web address] for General Surgery [facility] coverage (Please, do not use SecureChat as it is not reliable communication to operating surgeons for immediate patient care)

## 2021-02-25 NOTE — Op Note (Signed)
02/25/2021  11:59 AM  PATIENT:  Beth [name]  50 y.o. female  Patient Care Team: Caryl Bis, MD as PCP - General (Unknown Physician Specialty) Michael Boston, MD as Consulting Physician (General Surgery) Wonda Horner, MD as Consulting Physician (Gastroenterology) [name], MD as Referring Physician (Obstetrics and Gynecology) Virgel Manifold, MD as Consulting Physician (Gastroenterology)  PRE-OPERATIVE DIAGNOSIS:  ANAL CANAL MASS WITH AIN III  POST-OPERATIVE DIAGNOSIS:   Squamous Cell Cancer of the Anal Canal Labial skin mass, probable Condyloma  PROCEDURE:   ANORECTAL EXAM UNDER ANESTHESIA TRANSANAL EXCISIONAL  BIOPSY OF ANAL CANAL MASS EXCISION of  LABIAL LESION  SURGEON:  [name], MD  ANESTHESIA:   General Anorectal & Local field block (0.25% bupivacaine with epinephrine mixed with Liposomal bupivacaine (Experel)   EBL:  Total I/O In: 900 [I.V.:900] Out: 10 [Blood:10].  See operative record  Delay start of Pharmacological VTE agent (>24hrs) due to surgical blood loss or risk of bleeding:  NO  DRAINS: NONE  SPECIMEN:    -Posterior anal canal ulcerated mass with partial excision for biopsy -Posterior midline anal tag.  Most likely sentinel tag -Left external lateral labial skin tag.  Acrochordon versus condyloma  DISPOSITION OF SPECIMEN:  PATHOLOGY  COUNTS:  YES  PLAN OF CARE: Discharge home after PACU  PATIENT DISPOSITION:  PACU - hemodynamically stable.  INDICATION: Pleasant patient with history of anal intraepithelial neoplasia status post excision and treatment in 2017.  Lost to follow-up.  And recurrent swelling and concern.  Had colonoscopy which raised concern of recurrent anal canal mass.  Biopsy showing at least anal intraepithelial neoplasia.  Surgical consultation requested.  Patient had discomfort so examination limited in the office.  I recommended anorectal examination under anesthesia with removal and/or biopsy.     The anatomy & physiology of the anorectal region was discussed.  The pathophysiology of anorectal warts and differential diagnosis was discussed.  Natural history risks without surgery was discussed such as further growth and cancer.   I stressed the importance of office follow-up to catch early recurrence & minimize/halt progression of disease.  Interventions such as cauterization by topical agents were discussed.  The patient's symptoms are not adequately controlled by non-operative treatments.  I feel the risks & problems of no surgery outweigh the operative risks; therefore, I recommended surgery to treat the anal warts by removal, ablation and/or cauterization.  Risks such as bleeding, infection, need for further treatment, Risks of bleeding, infection, injury to other organs, need for repair of tissues / organs, reoperation, heart attack, death, and other risks were discussed.   I noted a good likelihood this will help address the problem. Goals of post-operative recovery were discussed as well.  Possibility that this will not correct all symptoms was explained.  Post-operative pain, bleeding, constipation, and other problems after surgery were discussed.  We will work to minimize complications.   Educational handouts further explaining the pathology, treatment options, and bowel regimen were given as well.  Questions were answered.  The patient expresses understanding & wishes to proceed with surgery.   OR FINDINGS: Firm 4 x 4 cm sessile but fungating anal canal mass continuing to the anal verge.  Primarily right lateral and posterior.  Involving 30% of the circumference.  Fixed to and probably going through the posterior anal sphincter complex.  Grossly suspicious for cancer.  Frozen biopsy done.  Discussed with Dr. Jaquita Folds with pathology.  Squamous cell carcinoma noted by him on frozen evaluation.Marland Kitchen  Possible focus of high-grade differentiated versus neuroendocrine changes.  No evidence of  adenocarcinoma.  Small posterior midline sentinel tag.  Excised.  7 cm pedunculated verrucous skin tag on left lateral external labia most likely consistent with condyloma versus atypical acrochordon.  Excised.  No other suspicious lesions.  DESCRIPTION:   Informed consent was confirmed. Patient underwent general anesthesia without difficulty. Patient was placed into prone positioning.  The perianal region was prepped and draped in sterile fashion. Surgical time-out confirmed our plan.  I did digital rectal examination and then transitioned over to anoscopy to get a sense of the anatomy.  Findings noted above.   Patient clearly had a firm ulcerative fungating mostly sessile mass involving the anal canal.  Fullness going through the sphincters.  Highly suspicious for anal cancer.  I do scissors to sharply debulk the specimen without involving the sphincters.  Sent this for frozen specimen.  I assured hemostasis with focused cautery and interrupted Vicryl suture.  Patient had persistent posterior midline anal tag.  It seemed smooth like a sentinel tag from a prior chronic fissure.  I excised it.  I reinspected and hemostasis was good after suturing and pressure held for 15 minutes while I waited for frozen specimen evaluation.  And noted a skin mass on the left lateral labia that I excised vertically and closed with interrupted Monocryl suture and Dermabond.  Frozen specimen confirmed squamous cell cancer, unfortunately.  No evidence of adenocarcinoma.  Since this was not resectable and potentially better treated with chemoradiation therapy, I completed the case.  I had discussed postop care in detail with the patient in the preop holding area.  Instructions for post-operative recovery and prescriptions are written. I discussed operative findings, updated the patient's status, discussed probable steps to recovery, and gave postoperative recommendations to the patient's spouse, [name], [name].   Recommendations were made.  Questions were answered.  He expressed understanding & appreciation.  [name], M.D., F.A.C.S. Gastrointestinal and Minimally Invasive Surgery [facility] [address], [place], [place] [address] [phone] Main / Paging

## 2021-02-28 ENCOUNTER — Encounter (HOSPITAL_BASED_OUTPATIENT_CLINIC_OR_DEPARTMENT_OTHER): Payer: Self-pay | Admitting: Surgery

## 2021-02-28 ENCOUNTER — Other Ambulatory Visit: Payer: Self-pay

## 2021-02-28 DIAGNOSIS — C210 Malignant neoplasm of anus, unspecified: Secondary | ICD-10-CM

## 2021-02-28 NOTE — Progress Notes (Signed)
Spoke with patient regarding referral we received from Dr. Johney Maine regarding anal squamous cell cancer.  I have offered her to come into see medical oncology Dr. Julieanne Manson tomorrow 3/15 at 11:30 to arrive by 11:15.  She was given our physical address and explained valet parking is available.  She knows she may bring one person with her for the consult.  She verbalized an understanding.   I have also referred her to radiation oncology.

## 2021-02-28 NOTE — Progress Notes (Signed)
GI Location of Tumor / Histology: Anal Cancer- Squamous Cell Carcinoma  Beth [name] presented for the annual physical and due to age it was time to get colonoscopy.  She was seen by her PCP who referred her to GI.    Anorectal Exam under anesthesia 02/25/2021:   Colonoscopy 01/2021:  Biopsies of  Polyps and Rectal Lesion 01/28/2021   08/30/2016   Past/Anticipated interventions by surgeon, if any:  Dr. Johney Maine -Lateral internal and sphincterotomy 08/30/2016   Past/Anticipated interventions by medical oncology, if any:  Dr. Benay Spice 03/01/2021 11:30 am   Weight changes, if any: No  Bowel/Bladder complaints, if any: Has bowel issues at baseline.  She reports difficulty with stools. No bladder changes.  Nausea / Vomiting, if any: No  Pain issues, if any:  None.  Notes some with hard stools.  She is using miralax daily.  Any blood per rectum: Occasional blood when she wipes.  SAFETY ISSUES:  Prior radiation? No  Pacemaker/ICD? No  Possible current pregnancy? Hysterectomy  Is the patient on methotrexate? No  Current Complaints/Details:

## 2021-03-01 ENCOUNTER — Other Ambulatory Visit: Payer: Self-pay

## 2021-03-01 ENCOUNTER — Telehealth: Payer: Self-pay | Admitting: Oncology

## 2021-03-01 ENCOUNTER — Encounter: Payer: Self-pay | Admitting: Radiation Oncology

## 2021-03-01 ENCOUNTER — Ambulatory Visit
Admission: RE | Admit: 2021-03-01 | Discharge: 2021-03-01 | Disposition: A | Discharge status: Home / Self Care | Payer: BC Managed Care – PPO | Source: Ambulatory Visit | Attending: Radiation Oncology | Admitting: Radiation Oncology

## 2021-03-01 ENCOUNTER — Inpatient Hospital Stay: Payer: BC Managed Care – PPO | Attending: Oncology | Admitting: Oncology

## 2021-03-01 VITALS — BP 140/86 | HR 71 | Temp 97.0°F | Resp 18 | Ht 67.0 in | Wt 160.5 lb

## 2021-03-01 VITALS — BP 126/89 | HR 60 | Temp 98.5°F | Resp 20 | Ht 67.0 in | Wt 159.7 lb

## 2021-03-01 DIAGNOSIS — C211 Malignant neoplasm of anal canal: Secondary | ICD-10-CM | POA: Diagnosis not present

## 2021-03-01 DIAGNOSIS — K5900 Constipation, unspecified: Secondary | ICD-10-CM | POA: Diagnosis not present

## 2021-03-01 DIAGNOSIS — F17210 Nicotine dependence, cigarettes, uncomplicated: Secondary | ICD-10-CM | POA: Insufficient documentation

## 2021-03-01 DIAGNOSIS — C210 Malignant neoplasm of anus, unspecified: Secondary | ICD-10-CM

## 2021-03-01 DIAGNOSIS — Z79899 Other long term (current) drug therapy: Secondary | ICD-10-CM | POA: Diagnosis not present

## 2021-03-01 DIAGNOSIS — R232 Flushing: Secondary | ICD-10-CM | POA: Diagnosis not present

## 2021-03-01 NOTE — Progress Notes (Signed)
Radiation Oncology         [phone] ________________________________  Name: Beth [name]        MRN: [id number]  Date of Service: 03/01/2021 DOB: 1971/02/22  OE:UMPNTI, Mitzie Na, MD  Ladell Pier, MD     REFERRING PHYSICIAN: Dr. Johney Maine  DIAGNOSIS: The encounter diagnosis was Squamous cell carcinoma of anal canal ([place]).   HISTORY OF PRESENT ILLNESS: Beth [name] is a 50 y.o. female seen at the request of Dr. Johney Maine for a new diagnosis of squamous cell carcinoma of the anus.  The patient has had a history of anal intraepithelial neoplasia grade 3 for which she has undergone prior excision of a fissure in 2017.  She was followed for about a year with Dr. Johney Maine but did not realize she needed continued evaluation. She proceeded with screening colonoscopy in February 2022 due to turning 49. This showed an anal lesion and a biopsy showed intraepithelial neoplasia grade 3, she has never had a history of condyloma and has had normal Pap smears and even normal Paps since hysterectomy per report.  Her hysterectomy was not performed due to any type of dysplastic findings.  She was evaluated by Dr. Johney Maine and underwent an exam under anesthesia with anoscopy on 02/25/2021. There were 2 sigmoid colon polyps that were removed and consistent with hyperplastic polyps and intramucosal lymphoid aggregates no dysplasia or malignancy was noted.  A lesion of the rectum showed high-grade squamous intraepithelial lesion at least cannot exclude invasive squamous cell carcinoma.  Given these findings, she has been referred to discuss chemoradiation.  She is going to see Dr. Benay Spice this morning as well.   PREVIOUS RADIATION THERAPY: No   PAST MEDICAL HISTORY:  Past Medical History:  Diagnosis Date  . Acne    on face spirolactone for  . Anal cancer ([place]) 02/25/2021  . Constipation, chronic 08/08/2016  . History of migraine    none in years  . Wears glasses        PAST SURGICAL HISTORY: Past Surgical  History:  Procedure Laterality Date  . ABDOMINAL HYSTERECTOMY  2016   partial  . COLONOSCOPY    . COLONOSCOPY WITH PROPOFOL N/A 01/28/2021   Procedure: COLONOSCOPY WITH PROPOFOL;  Surgeon: Virgel Manifold, MD;  Location: [facility];  Service: Endoscopy;  Laterality: N/A;  . EXCISION HYDRADENITIS LABIA Left 02/25/2021   Procedure: EXCISION of  LABIAL LESIOM;  Surgeon: Michael Boston, MD;  Location: [facility] [facility];  Service: General;  Laterality: Left;  . RECTAL EXAM UNDER ANESTHESIA N/A 02/25/2021   Procedure: ANORECTAL EXAM UNDER ANESTHESIA;  Surgeon: Michael Boston, MD;  Location: [facility];  Service: General;  Laterality: N/A;  . SPHINCTEROTOMY N/A 08/30/2016   Procedure: LATERAL INTERNAL AND SPHINCTEROTOMY;  Surgeon: Michael Boston, MD;  Location: [facility];  Service: General;  Laterality: N/A;  . TONSILLECTOMY  as child  . TRANSANAL EXCISION OF RECTAL MASS N/A 02/25/2021   Procedure: TRANSANAL EXCISIONAL  BIOPSY OF ANAL CANAL MASS;  Surgeon: Michael Boston, MD;  Location: [facility];  Service: General;  Laterality: N/A;     FAMILY HISTORY:  Family History  Problem Relation Age of Onset  . Breast cancer Paternal Aunt      SOCIAL HISTORY:  reports that she has been smoking cigarettes. She has a 8.00 pack-year smoking history. She has never used smokeless tobacco. She reports current alcohol use. She reports that she does not use drugs. The patient is married and  lives in [place]. 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    ALLERGIES: Clindamycin/lincomycin and Sulfa drugs cross reactors   MEDICATIONS:  Current Outpatient Medications  Medication Sig Dispense Refill  . cetirizine (ZYRTEC) 10 MG tablet Take 10 mg by mouth daily as needed for allergies.    Marland Kitchen oxyCODONE (OXY IR/ROXICODONE) 5 MG immediate release tablet Take 1-2 tablets (5-10 mg total) by mouth every 6 (six) hours as needed for moderate pain, severe pain or  breakthrough pain. 30 tablet 0  . spironolactone (ALDACTONE) 100 MG tablet Take 100 mg by mouth daily. Takes at 1200 pm takes for facial acne    . valACYclovir (VALTREX) 1000 MG tablet Take 1,000 mg by mouth daily. As needed     No current facility-administered medications for this encounter.     REVIEW OF SYSTEMS: On review of systems, the patient reports that she had a hard stool a few months ago and a small amount of bleeding after that time.  Since that time, she has not had any difficulty with bowel or bladder activity. She denies any pain in the rectum or pelvis but does have discomfort in the low back and upper buttock area.      PHYSICAL EXAM:  Wt Readings from Last 3 Encounters:  03/01/21 160 lb 8 oz (72.8 kg)  03/01/21 159 lb 11.2 oz (72.4 kg)  02/25/21 158 lb 11.2 oz (72 kg)   Pain Assessment Pain Score: 5  Pain Loc: Buttocks (Lower back/upper buttocks)/10  In general this is a well appearing Caucasian in no acute distress.  She's alert and oriented x4 and appropriate throughout the examination. Cardiopulmonary assessment is negative for acute distress and she exhibits normal effort.     ECOG =   0 - Asymptomatic (Fully active, able to carry on all predisease activities without restriction)  1 - Symptomatic but completely ambulatory (Restricted in physically strenuous activity but ambulatory and able to carry out work of a light or sedentary nature. For example, light housework, office work)  2 - Symptomatic, <50% in bed during the day (Ambulatory and capable of all self care but unable to carry out any work activities. Up and about more than 50% of waking hours)  3 - Symptomatic, >50% in bed, but not bedbound (Capable of only limited self-care, confined to bed or chair 50% or more of waking hours)  4 - Bedbound (Completely disabled. Cannot carry on any self-care. Totally confined to bed or chair)  5 - Death   Eustace Pen MM, Creech RH, Tormey DC, et al. [phone]). "Toxicity  and response criteria of the [facility]". [place] Oncol. 5 (6): 649-55    LABORATORY DATA:  Lab Results  Component Value Date   WBC 10.7 11/29/2020   HGB 16.0 (H) 02/25/2021   HCT 47.0 (H) 02/25/2021   MCV 96 11/29/2020   PLT 192 11/29/2020   Lab Results  Component Value Date   NA 139 02/25/2021   K 3.7 02/25/2021   CL 102 02/25/2021   CO2 20 11/29/2020   Lab Results  Component Value Date   ALT 67 (H) 11/29/2020   AST 39 11/29/2020   ALKPHOS 62 11/29/2020   BILITOT 0.7 11/29/2020      RADIOGRAPHY: No results found.     IMPRESSION/PLAN: 1. Squamous cell carcinoma of the anus.  Dr. Lisbeth Renshaw reviews the biopsy findings and description of AIN 3 cannot fully rule out invasive disease, based on this he favors treating  this like an invasive carcinoma, and recommends PET scan imaging to determine extent of disease and rule out metastatic findings.  We will order this for the patient.  We discussed that based on the timing of her PET scan we would like to proceed with simulation and subsequent chemoradiation.  Dr. Reece Levy discusses the nature of this type of disease, as well as the course of radiotherapy and would anticipate a course of 6 weeks of treatment with ongoing chemo sensitization which she will talk with Dr. Benay Spice about this morning as well.  We discussed the risks, benefits, short and long-term effects of radiotherapy, and the patient is interested in proceeding. Written consent is obtained and placed in the chart, a copy was provided to the patient. She will be contacted to coordinate simulation once we know the date of her PET scan.  In a visit lasting 60 minutes, greater than 50% of the time was spent face to face discussing the patient's condition, in preparation for the discussion, and coordinating the patient's care.    The above documentation reflects my direct findings during this shared patient visit. Please see the separate note by Dr.  Lisbeth Renshaw on this date for the remainder of the patient's plan of care.    Carola Rhine, [facility]   **Disclaimer: This note was dictated with voice recognition software. Similar sounding words can inadvertently be transcribed and this note may contain transcription errors which may not have been corrected upon publication of note.**

## 2021-03-01 NOTE — Progress Notes (Signed)
Met with patient and her husband Beth [name] today at initial medical oncology consult with Dr. Julieanne Manson.  I explained my role as nurse navigator and she was given my direct contact information.  Patient verbalized an understanding of the plan of care outlined by Dr. Benay Spice and Dr. Lisbeth Renshaw today.  I escorted them to scheduling after her consult.

## 2021-03-01 NOTE — Telephone Encounter (Signed)
Scheduled per los. Gave avs and calendar  

## 2021-03-01 NOTE — Progress Notes (Signed)
START ON PATHWAY REGIMEN - Anal Carcinoma     One cycle, concurrent with RT:     Fluorouracil      Mitomycin   **Always confirm dose/schedule in your pharmacy ordering system**  Patient Characteristics: Anal Canal Tumors, Newly Diagnosed - Locoregional Disease (Clinical Staging) Therapeutic Status: Newly Diagnosed - Locoregional Disease (Clinical Staging) AJCC T Category: Staged < 8th Ed. AJCC N Category: Staged < 8th Ed. AJCC 8 Stage Grouping: Staged < 8th Ed. AJCC M Category: Staged < 8th Ed. Intent of Therapy: Curative Intent, Discussed with Patient

## 2021-03-01 NOTE — Progress Notes (Signed)
[facility] New Patient Consult   Requesting MD: Caryl Bis, Md [place],  [place] [phone]   Beth [name] Situ 50 y.o.  30-Apr-1971    Reason for Consult: Anal cancer   HPI: Beth [name] underwent a screening colonoscopy on 01/28/2021 by Dr. Bonna Gains at [facility].  A "lesion was noted on rectal exam.  To polyps were removed from the sigmoid colon.  An ulcerated mass was found in the rectum.  Biopsies were obtained.  The sigmoid colon polyps returned as a hyperplastic polyp and an intramucosal lymphoid aggregate.  The rectum biopsy revealed high-grade squamous intraepithelial lesion (AIN 3/carcinoma in situ).  She was referred to Beth [name] and was taken to the operating room for an examination under anesthesia and transanal excision of an anal canal mass on 02/25/2021.  A 4 x 4 centimeter sessile fungating anal canal mass was noted extending to the anal verge.  The mass was 30% circumferential involving primarily the right lateral and posterior anus.  The mass appeared to be fixed to the posterior anal sphincter complex.  A frozen biopsy returned consistent with squamous cell carcinoma.  There was a possible focus of high-grade differentiated versus neuroendocrine change.  No evidence of adenocarcinoma.  A verrucous skin tag was excised from the left lateral external labia.  The anal mass was debulked.  The final pathology report is pending.  She saw Beth [name] earlier today.  Past Medical History:  Diagnosis Date  . Acne    on face spirolactone for  . Anal cancer ([place]) 02/25/2021  . Constipation, chronic 08/08/2016  . History of migraine    none in years  . Wears glasses     .  G3, P3   .  Eczema  Past Surgical History:  Procedure Laterality Date  . ABDOMINAL HYSTERECTOMY  2016   partial  . COLONOSCOPY    . COLONOSCOPY WITH PROPOFOL N/A 01/28/2021   Procedure: COLONOSCOPY WITH PROPOFOL;  Surgeon: Virgel Manifold, MD;  Location: [facility];  Service:  Endoscopy;  Laterality: N/A;  . EXCISION HYDRADENITIS LABIA Left 02/25/2021   Procedure: EXCISION of  LABIAL LESIOM;  Surgeon: Michael Boston, MD;  Location: [facility];  Service: General;  Laterality: Left;  . RECTAL EXAM UNDER ANESTHESIA N/A 02/25/2021   Procedure: ANORECTAL EXAM UNDER ANESTHESIA;  Surgeon: Michael Boston, MD;  [facility];  Service: General;  Laterality: N/A;  . SPHINCTEROTOMY N/A 08/30/2016   Procedure: LATERAL INTERNAL AND SPHINCTEROTOMY;  Surgeon: Michael Boston, MD;  Location: [facility];  Service: General;  Laterality: N/A;  . TONSILLECTOMY  as child  . TRANSANAL EXCISION OF RECTAL MASS N/A 02/25/2021   Procedure: TRANSANAL EXCISIONAL  BIOPSY OF ANAL CANAL MASS;  Surgeon: Michael Boston, MD;  Location: [facility];  Service: General;  Laterality: N/A;    Medications: Reviewed  Allergies:  Allergies  Allergen Reactions  . Clindamycin/Lincomycin Rash  . Sulfa Drugs Cross Reactors Itching and Rash    Family history: Maternal grandfather had "skin cancer "at the face  Social History:   She lives with her husband and Beth [name].  She works as a Cabin crew for a Engineer, production.  She currently smokes 1 cigarette/day.  She has 2-3 alcohol drinks per day.  No transfusion history.  No risk factor for HIV or hepatitis.  ROS:   Positives include: Hot flashes, chronic constipation, rectal bleeding for the past 3 months  A complete ROS was otherwise  negative.  Physical Exam:  Blood pressure 126/89, pulse 60, temperature 98.5 F (36.9 C), resp. rate 20, height 5\' 7"  (1.702 m), weight 159 lb 11.2 oz (72.4 kg), last menstrual period 03/13/2012, SpO2 100 %.  HEENT: Neck without mass Lungs: Clear bilaterally Cardiac: Regular rate and rhythm Abdomen: No hepatosplenomegaly, nontender, no mass Rectal: Irregularity of the posterior anal canal extending to the anal verge, no significant raised mass Vascular: No leg  edema Lymph nodes: No cervical, supraclavicular, axillary, or inguinal nodes Neurologic: Alert and oriented, the motor exam appears intact in the upper and lower extremities bilaterally Skin: No rash Musculoskeletal: No spine tenderness   LAB:  CBC  Lab Results  Component Value Date   WBC 10.7 11/29/2020   HGB 16.0 (H) 02/25/2021   HCT 47.0 (H) 02/25/2021   MCV 96 11/29/2020   PLT 192 11/29/2020   NEUTROABS 4.3 10/07/2018        CMP  Lab Results  Component Value Date   NA 139 02/25/2021   K 3.7 02/25/2021   CL 102 02/25/2021   CO2 20 11/29/2020   GLUCOSE 122 (H) 02/25/2021   BUN 8 02/25/2021   CREATININE 0.50 02/25/2021   CALCIUM 9.6 11/29/2020   PROT 7.2 11/29/2020   ALBUMIN 4.8 11/29/2020   AST 39 11/29/2020   ALT 67 (H) 11/29/2020   ALKPHOS 62 11/29/2020   BILITOT 0.7 11/29/2020   GFRNONAA 92 11/29/2020   GFRAA 106 11/29/2020       Imaging: PET-pending   Assessment/Plan:   1. Squamous cell carcinoma of the anal canal  Colonoscopy 01/28/2021-rectal mass extending to the "outside ", palpable on exam-biopsy revealed high-grade squamous intraepithelial lesion (AIN 3/carcinoma in situ)  Exam under anesthesia with transanal excision of an anal canal mass 02/25/2021-frozen section consistent with squamous cell carcinoma, 4 x 4 centimeter sessile anal canal mass extending to the anal verge, right lateral and posterior, 30% circumferential, fixed to the anal sphincter complex 2. Remote history of an anal fissure repair 3. Tobacco use 4. Moderate alcohol use 5. Eczema   Disposition:   Beth [name] has been diagnosed with squamous cell carcinoma of the anal canal.  She appears to have localized disease based on the history and physical examination.  A staging PET scan is pending.  I discussed the treatment of anal cancer with Beth [name] and her husband.  I will recommend concurrent chemotherapy and radiation if the staging PET scan reveals no evidence of  metastatic disease and the final pathology report confirms invasive squamous cell carcinoma.  We discussed the high chance of a clinical response and cure if she has localized disease.  I recommend 5-FU/mitomycin-C to be given during the first and fifth week of radiation.  We reviewed potential toxicities associated with this chemotherapy regimen including the chance of nausea/vomiting, mucositis, alopecia, and hematologic toxicity.  We discussed the sun sensitivity, hand/foot syndrome, hyperpigmentation, rash, and diarrhea associated with 5-fluorouracil.  We reviewed the hematologic toxicity and hemolytic uremic syndrome seen with mitomycin-C.  We discussed the chance of skin breakdown at the perineum and labia.  I recommended she completely discontinue smoking.  She agrees to proceed.  She will attend a chemotherapy teaching class.  Ms. Haubner will be referred for placement of a PICC on 03/14/2021 with the plan to begin 5-FU/mitomycin on that day.  She will be seen for an office visit on 03/14/2021.  She will return for a chemotherapy teaching class and baseline laboratory studies next week.  A chemotherapy  plan was entered today.  [name], MD  03/01/2021, 2:12 PM

## 2021-03-02 ENCOUNTER — Other Ambulatory Visit: Payer: Self-pay

## 2021-03-02 LAB — SURGICAL PATHOLOGY

## 2021-03-02 NOTE — Progress Notes (Signed)
The proposed treatment discussed in conference is for discussion purposes only and is not a binding recommendation.  The patients have not been physically examined, or presented with their treatment options.  Therefore, final treatment plans cannot be decided.   

## 2021-03-03 ENCOUNTER — Other Ambulatory Visit: Payer: Self-pay | Admitting: Radiation Oncology

## 2021-03-03 ENCOUNTER — Telehealth: Payer: Self-pay | Admitting: *Deleted

## 2021-03-03 ENCOUNTER — Telehealth: Payer: Self-pay

## 2021-03-03 ENCOUNTER — Encounter: Payer: Self-pay | Admitting: General Practice

## 2021-03-03 DIAGNOSIS — C211 Malignant neoplasm of anal canal: Secondary | ICD-10-CM

## 2021-03-03 NOTE — Progress Notes (Signed)
[name] Psychosocial Distress Screening Clinical Social Work  Clinical Social Work was referred by distress screening protocol.  The patient scored a 10 on the Psychosocial Distress Thermometer which indicates severe distress. Clinical Social Worker contacted patient by phone to assess for distress and other psychosocial needs. She feels much better now, after having talked with her medical team.  She has good support at home.  She hopes to continue to work during treatment - she can work from home as needed.  She would like more information on nutritional support during chemotherapy and radiation - will ask nurse navigator to assist with this.  CSW and patient discussed common feeling and emotions when being diagnosed with cancer, and the importance of support during treatment. CSW informed patient of the support team and support services at [facility]. CSW provided contact information and encouraged patient to call with any questions or concerns.  ONCBCN DISTRESS SCREENING 03/01/2021  Screening Type Initial Screening  Distress experienced in past week (1-10) 10  Emotional problem type Adjusting to illness  Information Concerns Type Lack of info about diagnosis;Lack of info about treatment  Other [phone]    Clinical Social Worker follow up needed: No.  If yes, follow up plan:  Beth [name], [place], LCSW Clinical Social Worker Phone:  [phone]

## 2021-03-03 NOTE — Telephone Encounter (Signed)
XXXX 

## 2021-03-03 NOTE — Telephone Encounter (Signed)
CALLED PATIENT TO INFORM OF CT FOR 03-07-21- ARRIVAL TIME - 7:45 AM @ [facility], PATIENT TO BE NPO- 4 HRS. PRIOR TO TEST, PATIENT TO PICK UP PREP ON 03-04-21 FOR CT ON 03-07-21, PATIENT TO REPORT TO RADIOLOGY FOR PICK-UP OF PREP, SPOKE WITH PATIENT AND SHE VERIFIED UNDERSTANDING ALL THESE APPTS.

## 2021-03-03 NOTE — Telephone Encounter (Signed)
Spoke with patient to let her know that [facility] denies her PET scan stating they wanted her to have CT scan first.  I explained that more than likely this is going to push her start date out.  I told her to expect a call from Central Scheduling about having the CT of Chest/Abd/Pelvis.  She verbalized an understanding.  I have made Dr. Benay Spice aware.

## 2021-03-07 ENCOUNTER — Telehealth: Payer: Self-pay | Admitting: Radiation Oncology

## 2021-03-07 ENCOUNTER — Encounter (HOSPITAL_COMMUNITY): Payer: Self-pay

## 2021-03-07 ENCOUNTER — Other Ambulatory Visit: Payer: Self-pay

## 2021-03-07 ENCOUNTER — Other Ambulatory Visit: Payer: Self-pay | Admitting: Radiation Oncology

## 2021-03-07 ENCOUNTER — Ambulatory Visit (HOSPITAL_COMMUNITY)
Admission: RE | Admit: 2021-03-07 | Discharge: 2021-03-07 | Disposition: A | Discharge status: Home / Self Care | Payer: BC Managed Care – PPO | Source: Ambulatory Visit | Attending: Radiation Oncology | Admitting: Radiation Oncology

## 2021-03-07 DIAGNOSIS — I2510 Atherosclerotic heart disease of native coronary artery without angina pectoris: Secondary | ICD-10-CM | POA: Diagnosis not present

## 2021-03-07 DIAGNOSIS — K7689 Other specified diseases of liver: Secondary | ICD-10-CM | POA: Diagnosis not present

## 2021-03-07 DIAGNOSIS — R600 Localized edema: Secondary | ICD-10-CM | POA: Diagnosis not present

## 2021-03-07 DIAGNOSIS — N838 Other noninflammatory disorders of ovary, fallopian tube and broad ligament: Secondary | ICD-10-CM | POA: Diagnosis not present

## 2021-03-07 DIAGNOSIS — N83291 Other ovarian cyst, right side: Secondary | ICD-10-CM | POA: Diagnosis not present

## 2021-03-07 DIAGNOSIS — C211 Malignant neoplasm of anal canal: Secondary | ICD-10-CM | POA: Diagnosis not present

## 2021-03-07 DIAGNOSIS — I700 Atherosclerosis of aorta: Secondary | ICD-10-CM | POA: Diagnosis not present

## 2021-03-07 DIAGNOSIS — C210 Malignant neoplasm of anus, unspecified: Secondary | ICD-10-CM | POA: Diagnosis not present

## 2021-03-07 IMAGING — CT CT CHEST W/ CM
2 of 5 series · 13 of 36 positions shown, 16 images · IV contrast (OMNIPAQUE)
Comparison: Right upper quadrant abdominal ultrasound of [DATE]

CLINICAL DATA: Staging workup of anal cancer.

EXAM:
CT CHEST, ABDOMEN, AND PELVIS WITH CONTRAST
TECHNIQUE: Multidetector CT imaging of the chest, abdomen and pelvis was
performed following the standard protocol during bolus
administration of intravenous contrast.
CONTRAST:  100mL OMNIPAQUE IOHEXOL 300 MG/ML  SOLN

[Series 2: cap with · axial · 0.78mm/px · z∈[-304,+236]mm · 10 of 134 slices shown, 13 images]
[im 13/134  mediastinal]
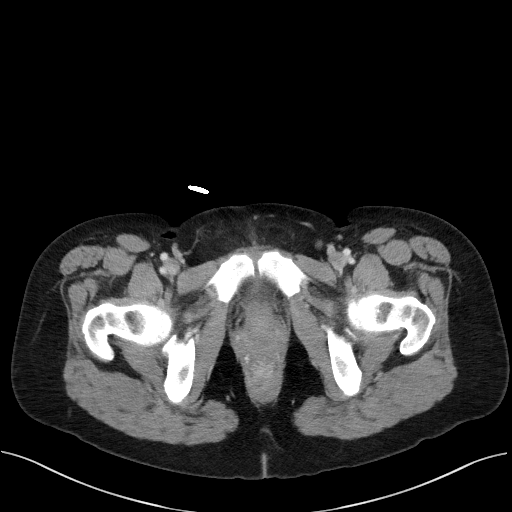
[im 13/134  lung]
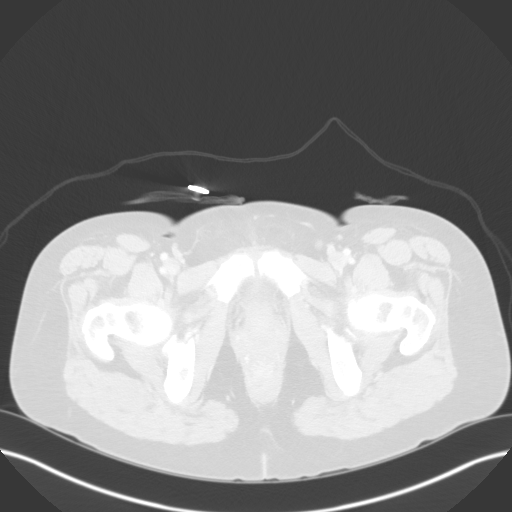
[im 25/134  lung]
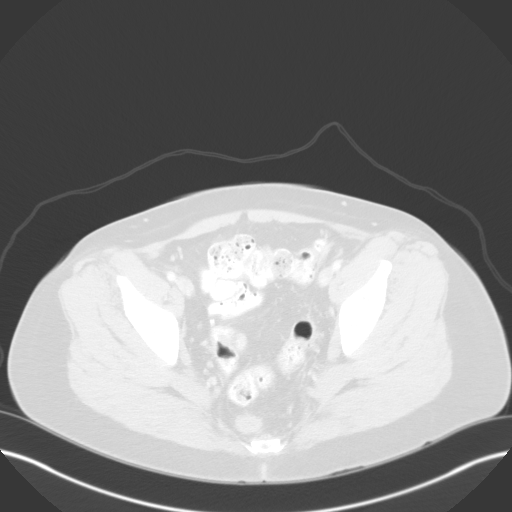
[im 37/134  lung]
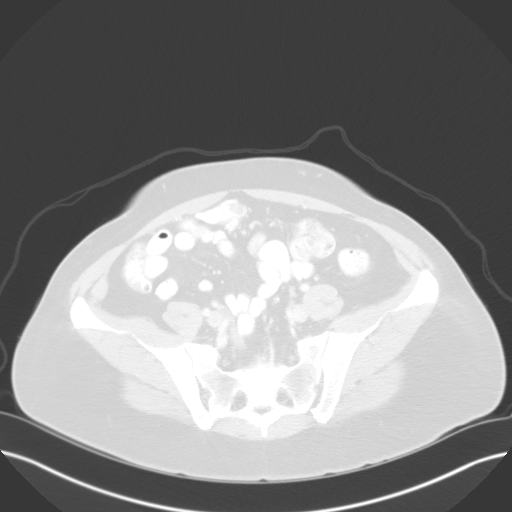
[im 49/134  lung]
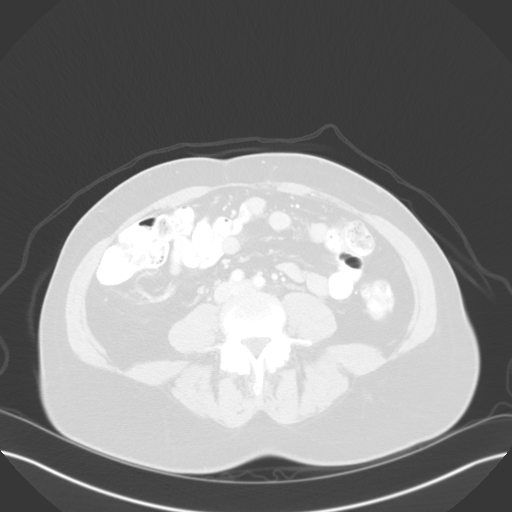
[im 61/134  mediastinal]
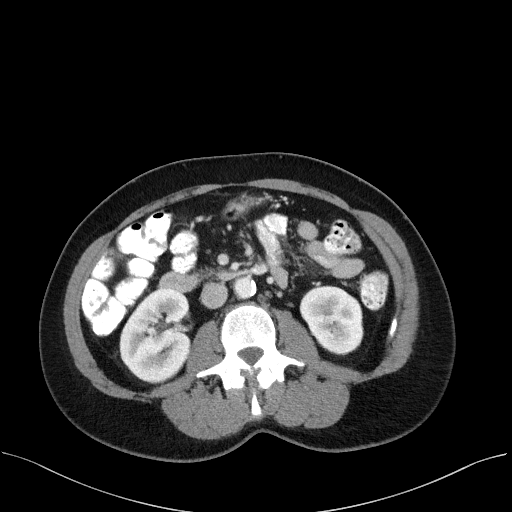
[im 61/134  lung]
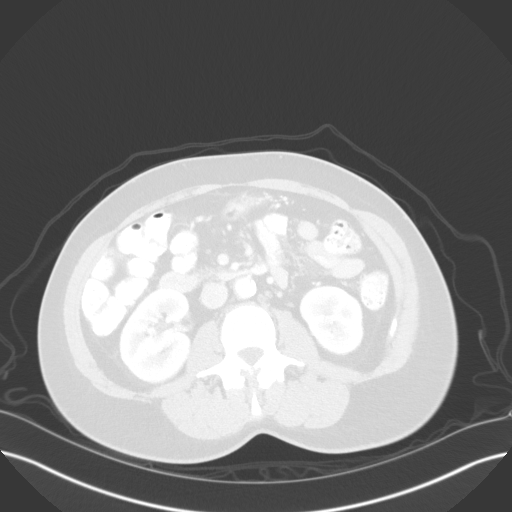
[im 73/134  lung]
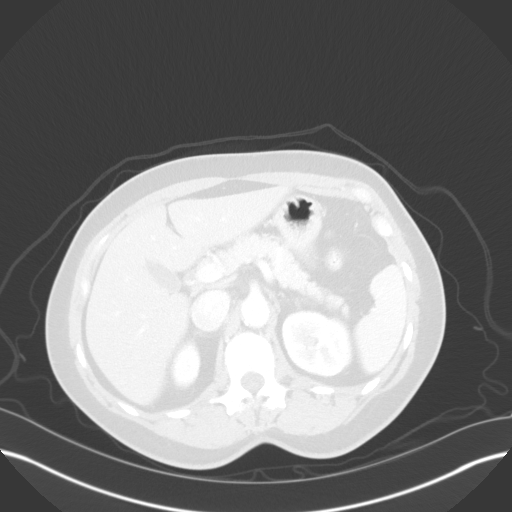
[im 85/134  lung]
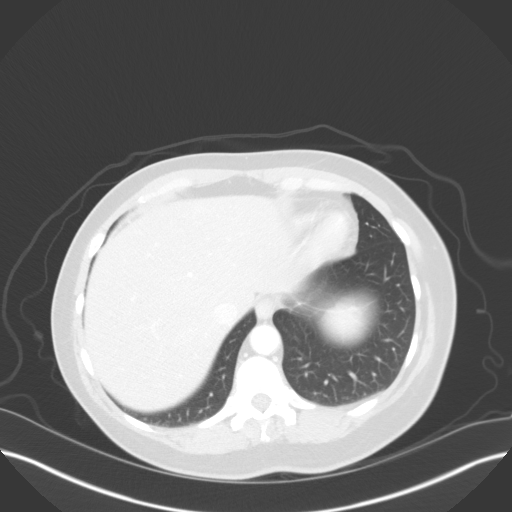
[im 97/134  lung]
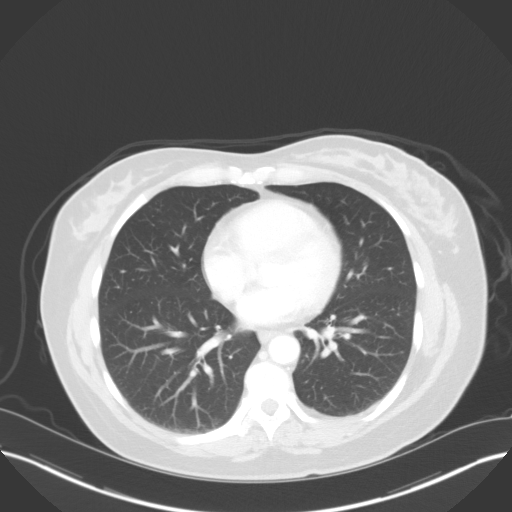
[im 109/134  mediastinal]
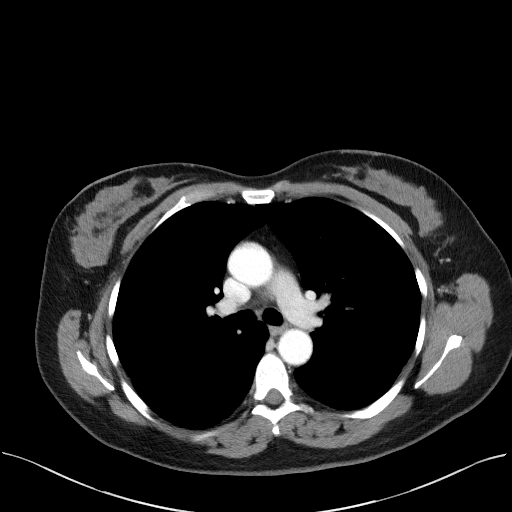
[im 109/134  lung]
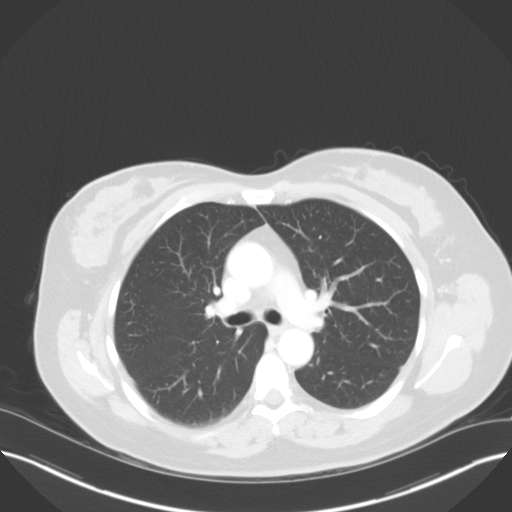
[im 121/134  lung]
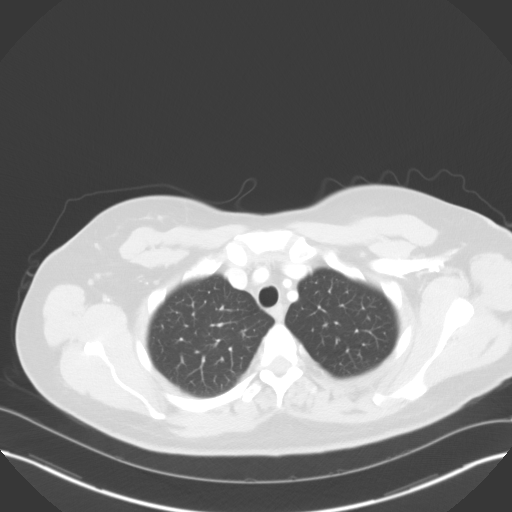

[Series 4: coronals · coronal · 0.92mm/px · 3 of 122 slices shown]
[im 25/122  lung]
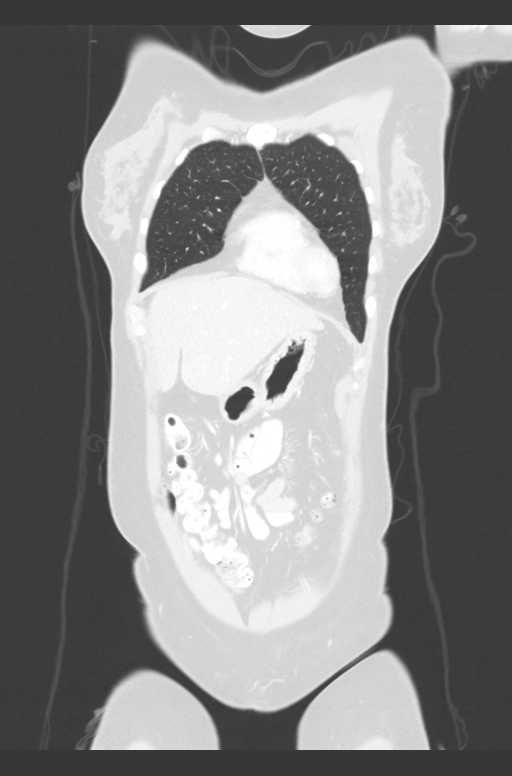
[im 49/122  lung]
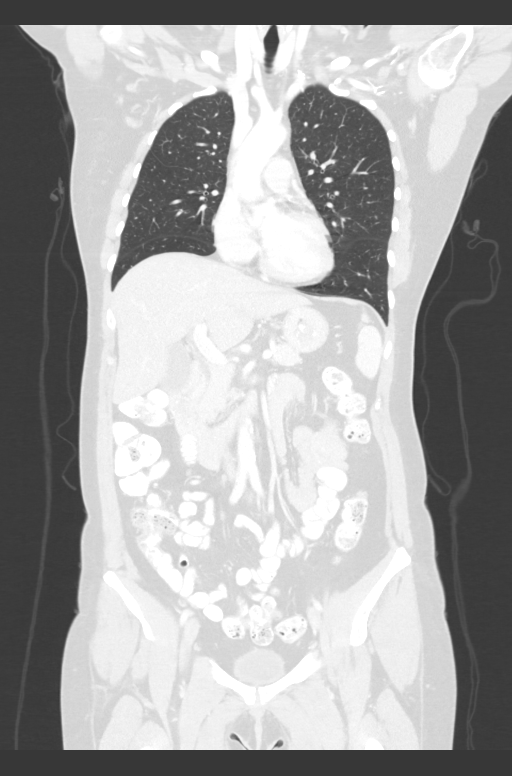
[im 73/122  lung]
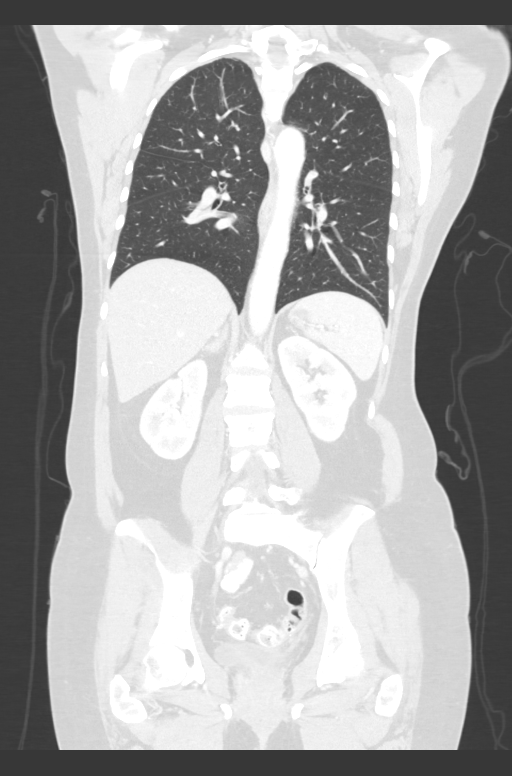

[13 of 36 positions shown; findings below may reference images not displayed]

FINDINGS: CT CHEST FINDINGS

Cardiovascular: Left anterior descending coronary artery
atherosclerotic calcification, image 34 series 2.

Mediastinum/Nodes: Unremarkable

Lungs/Pleura: Unremarkable

Musculoskeletal: Unremarkable

CT ABDOMEN PELVIS FINDINGS

Hepatobiliary: Faintly accentuated peripheral signal in the right
hepatic lobe on image 52 series 2 and image 53 series 4, measuring
1.2 by 0.8 by 0.9 cm. The liver appears otherwise normal.
Gallbladder unremarkable. No biliary dilatation.

Pancreas: Unremarkable

Spleen: Unremarkable

Adrenals/Urinary Tract: Unremarkable

Stomach/Bowel: Ill definition of tissue planes along the anus noted
with abnormal soft tissue prominence posteriorly along the anal
canal suspicious for mass for example on image 124 of series 2 and
image 83 of series 5. Mild presacral edema noted for example on
image 84 of series 5, nonspecific. Small lower perirectal lymph
nodes are shown on image 16 series 2 and a sacral node on image 107
of series 2 measures 0.5 cm in short axis.

Vascular/Lymphatic: Aortoiliac atherosclerotic vascular disease. No
additional adenopathy identified.

Reproductive: Uterus absent. 2.1 by 1.5 cm cystic lesion of the
right ovary on image 101 of series 2, potentially with a thin
enhancing rim. Punctate calcification in the left ovary.

Other: No supplemental non-categorized findings.

Musculoskeletal: Unremarkable
IMPRESSION: 1. Ill definition of tissue planes along the anus with abnormal soft
tissue prominence posteriorly along the anal canal suspicious for
mass. Small adjacent lower perirectal lymph nodes in addition to a
0.5 cm in short axis sacral node observed.
2. Focal enhancing lesion in the right hepatic lobe 1.2 by 0.8 by
0.9 cm. This lesion is very subtle and could simply be from
transient hepatic attenuation difference or a small benign hepatic
hemangioma, but is technically nonspecific. Given the history of
anal cancer, this probably merits further workup, consider hepatic
protocol MRI with and without contrast.
3. Coronary atherosclerosis.
4. 2.1 by 1.5 cm cystic lesion of the right ovary with thin
enhancing rim on image 101 of series 2. This could simply represent
a corpus luteum but given the thin enhancing rim is not likely to be
a simple cyst. This could be further characterized with pelvic
sonography if clinically warranted.
5. Mild presacral edema, nonspecific.
6. Aortic atherosclerosis. Left anterior descending coronary artery
atherosclerosis.

Aortic Atherosclerosis ([X7]-[X7]).

## 2021-03-07 MED ORDER — IOHEXOL 300 MG/ML  SOLN
100.0000 mL | Freq: Once | INTRAMUSCULAR | Status: AC | PRN
  Administered 2021-03-07: 100 mL via INTRAVENOUS

## 2021-03-07 NOTE — Progress Notes (Signed)
I called and left a voicemail asking the patient to call us back regarding her CT scan results.

## 2021-03-07 NOTE — Telephone Encounter (Signed)
The patient called back and we reviewed her CT scan results and need for PET scan. Orders were placed.

## 2021-03-08 ENCOUNTER — Inpatient Hospital Stay: Payer: BC Managed Care – PPO

## 2021-03-08 ENCOUNTER — Other Ambulatory Visit: Payer: Self-pay

## 2021-03-08 ENCOUNTER — Telehealth: Payer: Self-pay | Admitting: *Deleted

## 2021-03-08 MED ORDER — ONDANSETRON HCL 8 MG PO TABS: 8.0000 mg | ORAL_TABLET | Freq: Three times a day (TID) | ORAL | 1 refills | Status: DC | PRN

## 2021-03-08 MED ORDER — PROCHLORPERAZINE MALEATE 10 MG PO TABS: 10.0000 mg | ORAL_TABLET | Freq: Four times a day (QID) | ORAL | 1 refills | Status: DC | PRN

## 2021-03-08 NOTE — Telephone Encounter (Signed)
Called with concerns of her schedule--does not agree w/the notes she took. Reviewed appointments and informed her that the 3/30 pump stop should be on 4/1 and this will get corrected.  She is asking if we need to wait on PET scan to begin RT/chemo? Has not heard anything about this yet (still not authorized). Also, informed her that anti-emetics have been sent to her pharmacy. Per Dr. Benay Spice: OK from his standpoint to proceed without PET. Staff message to Dr. Lisbeth Renshaw and Bryson Ha, [place] asking their opinion.

## 2021-03-09 ENCOUNTER — Telehealth: Payer: Self-pay | Admitting: *Deleted

## 2021-03-09 ENCOUNTER — Telehealth: Payer: Self-pay | Admitting: Oncology

## 2021-03-09 NOTE — Telephone Encounter (Signed)
CALLED PATIENT TO INFORM OF PET SCAN FOR 03-24-21 - ARRIVAL TIME- 7:30 AM @ [facility], PATIENT TO BE NPO- AFTER MIDNIGHT THE DAY BEFORE, SPOKE WITH PATIENT AND SHE IS AWARE OF THIS TEST

## 2021-03-09 NOTE — Telephone Encounter (Signed)
Scheduled appt per Leeann Must secure chat - pt needs infusion from 3/28 moved to 4/4 . Pt is aware of appt on 4/4 at [facility].

## 2021-03-09 NOTE — Telephone Encounter (Signed)
Per Dr. Lisbeth Renshaw, needs PET scan prior to treatment start. Dr. Benay Spice notified. Cancel 3/28 start and will delay 1 week. Patient notified. Message to Dr. Ida Rogue staff informing of need for PET asap. Called IR and moved PICC to 03/21/21 at 0800/0830 at [facility]. Scheduling message sent to move lab/flush/ov/tx to 4/4 as well.

## 2021-03-14 ENCOUNTER — Other Ambulatory Visit: Payer: BC Managed Care – PPO

## 2021-03-14 ENCOUNTER — Other Ambulatory Visit (HOSPITAL_COMMUNITY): Payer: BC Managed Care – PPO

## 2021-03-14 ENCOUNTER — Ambulatory Visit: Payer: BC Managed Care – PPO | Admitting: Nurse Practitioner

## 2021-03-14 ENCOUNTER — Ambulatory Visit: Payer: BC Managed Care – PPO

## 2021-03-16 ENCOUNTER — Telehealth: Payer: BC Managed Care – PPO | Admitting: Gastroenterology

## 2021-03-16 ENCOUNTER — Telehealth: Payer: Self-pay | Admitting: *Deleted

## 2021-03-16 ENCOUNTER — Ambulatory Visit (HOSPITAL_COMMUNITY)
Admission: RE | Admit: 2021-03-16 | Discharge: 2021-03-16 | Disposition: A | Discharge status: Home / Self Care | Payer: BC Managed Care – PPO | Source: Ambulatory Visit | Attending: Radiation Oncology | Admitting: Radiation Oncology

## 2021-03-16 ENCOUNTER — Other Ambulatory Visit: Payer: Self-pay

## 2021-03-16 ENCOUNTER — Telehealth: Payer: Self-pay | Admitting: Radiation Oncology

## 2021-03-16 ENCOUNTER — Ambulatory Visit
Admission: RE | Admit: 2021-03-16 | Discharge: 2021-03-16 | Disposition: A | Discharge status: Home / Self Care | Payer: BC Managed Care – PPO | Source: Ambulatory Visit | Attending: Radiation Oncology | Admitting: Radiation Oncology

## 2021-03-16 DIAGNOSIS — C211 Malignant neoplasm of anal canal: Secondary | ICD-10-CM | POA: Insufficient documentation

## 2021-03-16 DIAGNOSIS — C50911 Malignant neoplasm of unspecified site of right female breast: Secondary | ICD-10-CM

## 2021-03-16 DIAGNOSIS — C44520 Squamous cell carcinoma of anal skin: Secondary | ICD-10-CM | POA: Diagnosis not present

## 2021-03-16 LAB — GLUCOSE, CAPILLARY: Glucose-Capillary: 139 mg/dL — ABNORMAL HIGH (ref 70–99)

## 2021-03-16 IMAGING — CT NM PET TUM IMG INITIAL (PI) SKULL BASE T - THIGH
8 series · 25 of 25 positions shown · non-contrast
Comparison: CT on [DATE]

CLINICAL DATA: Initial treatment strategy for anal carcinoma.

EXAM:
NUCLEAR MEDICINE PET SKULL BASE TO THIGH
TECHNIQUE: 8.0 mCi F-18 FDG was injected intravenously. Full-ring PET imaging
was performed from the skull base to thigh after the radiotracer. CT
data was obtained and used for attenuation correction and anatomic
localization.
Fasting blood glucose: 139 mg/dl

[Series 3: pet sk_thigh ac · axial · 5.0mm · 4.07mm/px · z∈[-950,-74]mm · 6 of 220 slices shown]
[im 1/220]
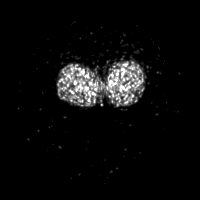
[im 44/220]
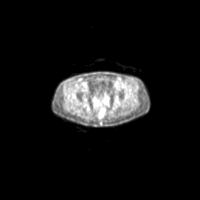
[im 88/220]
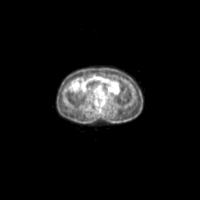
[im 132/220]
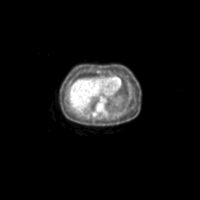
[im 176/220]
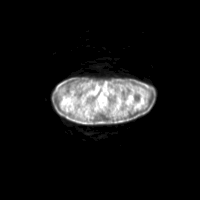
[im 220/220]
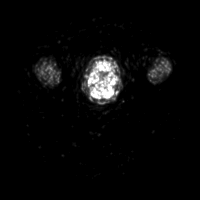

[Series 4: ct sk_thigh 5.0 bf37 · axial · 5.0mm · 0.93mm/px · z∈[-950,-74]mm · 5 of 220 slices shown]
[im 1/220]
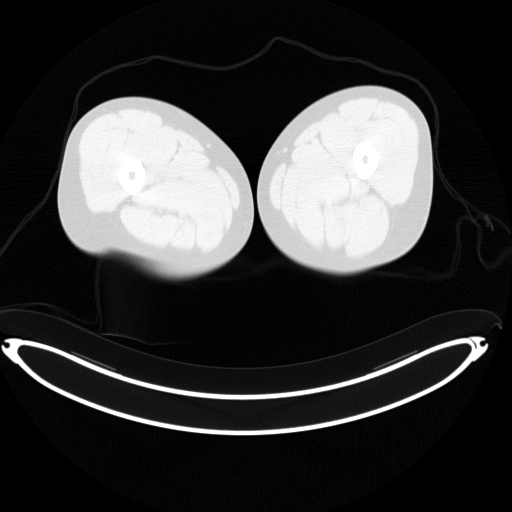
[im 55/220]
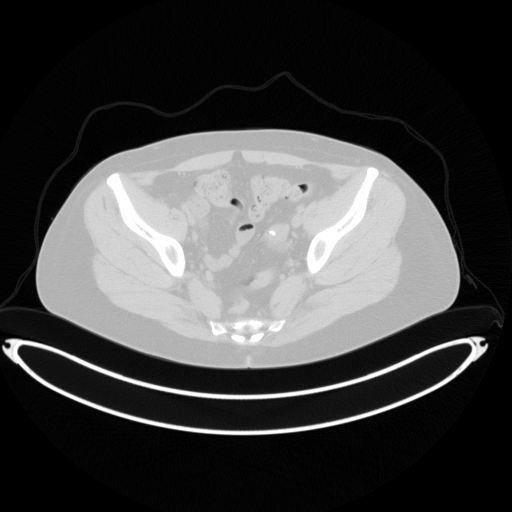
[im 110/220]
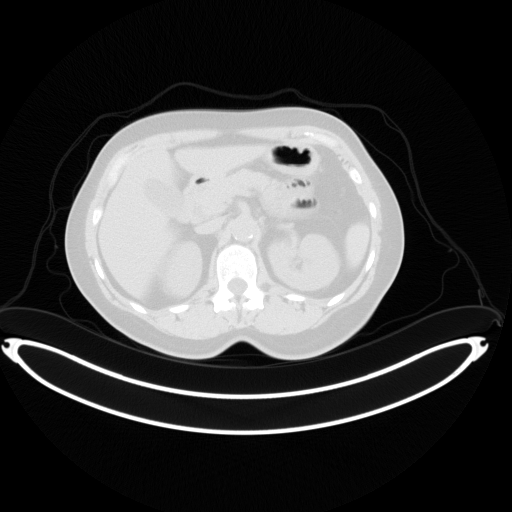
[im 165/220]
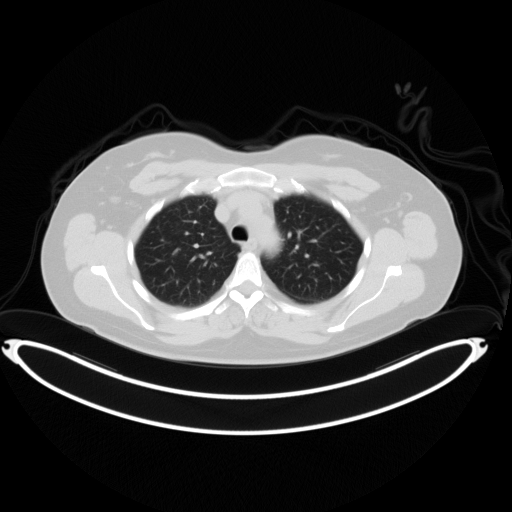
[im 220/220  brain]
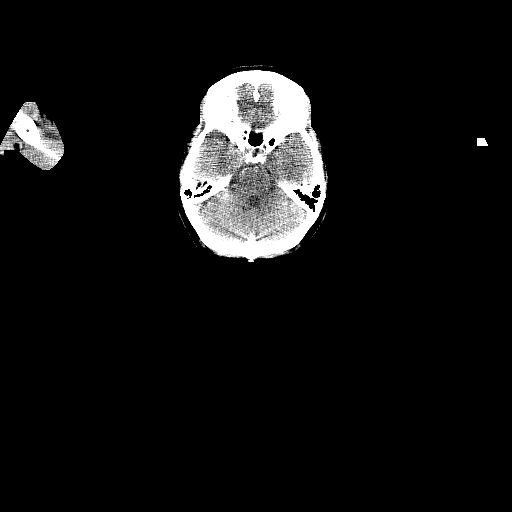

[Series 5: pet sk_thigh nac · axial · 5.0mm · 4.07mm/px · z∈[-950,-74]mm · 5 of 220 slices shown]
[im 1/220]
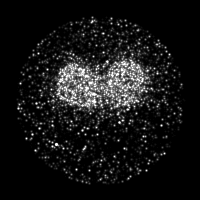
[im 55/220]
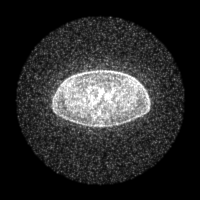
[im 110/220]
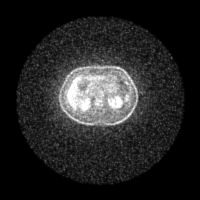
[im 165/220]
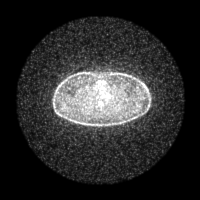
[im 220/220]
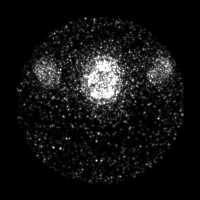

[Series 8: ct sk_thigh 5.0 br59 lung_bone · axial · 5.0mm · 0.54mm/px · 1 of 61 slices shown]
[im 1/61]
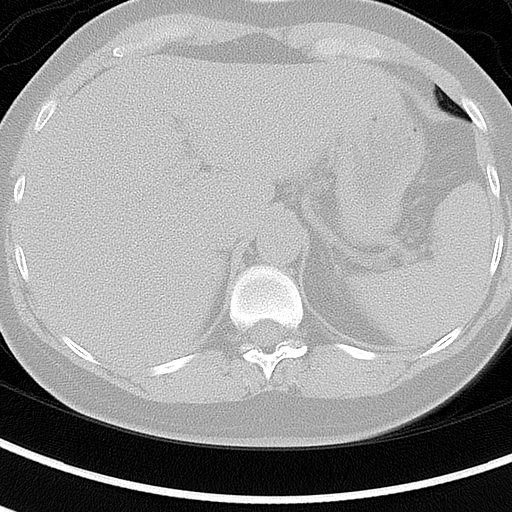

[Series 606: fused cor · 1 of 53 slices shown]
[im 1/53]
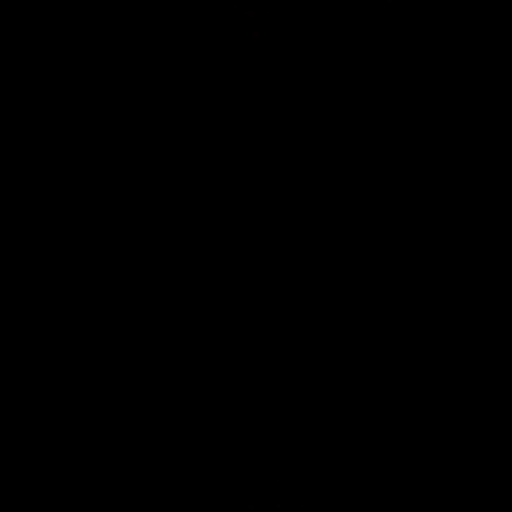

[Series 607: <mip collection> · coronal · 1.82mm/px · 1 of 32 slices shown]
[im 1/32]
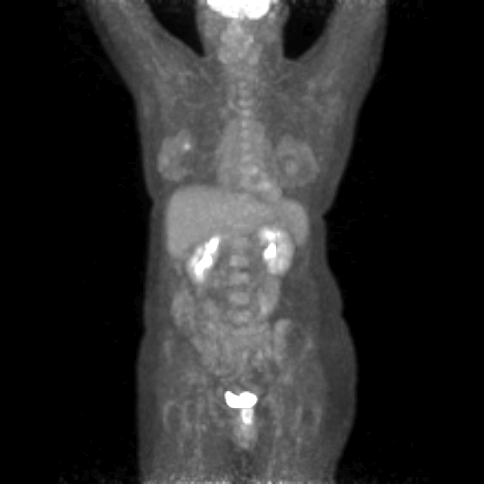

[Series 608: range-ct sk_thigh 5.0 bf37-tra-<alpha range> · 5 of 210 slices shown]
[im 1/210]
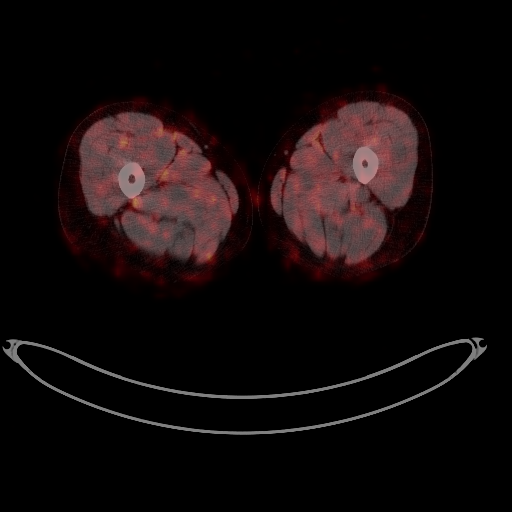
[im 53/210]
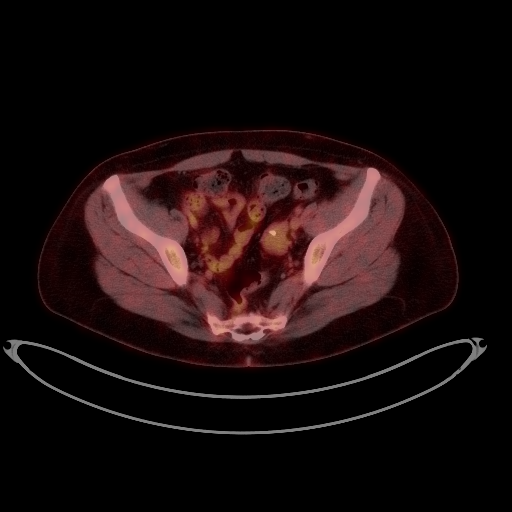
[im 105/210]
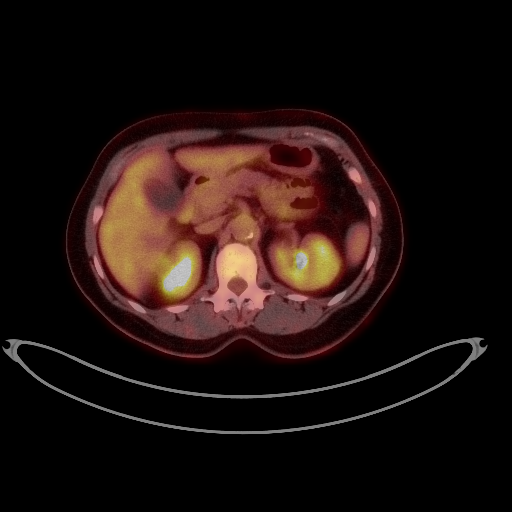
[im 157/210]
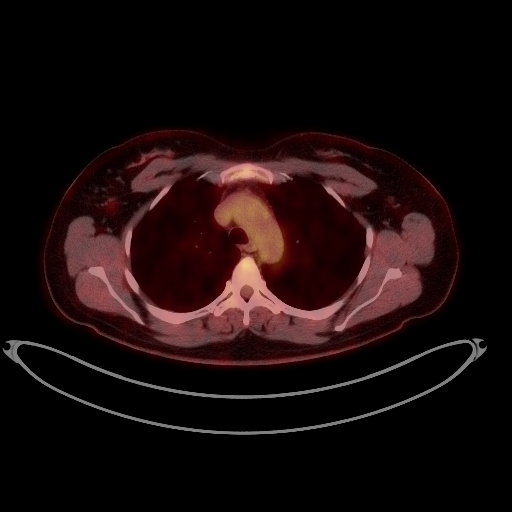
[im 210/210]
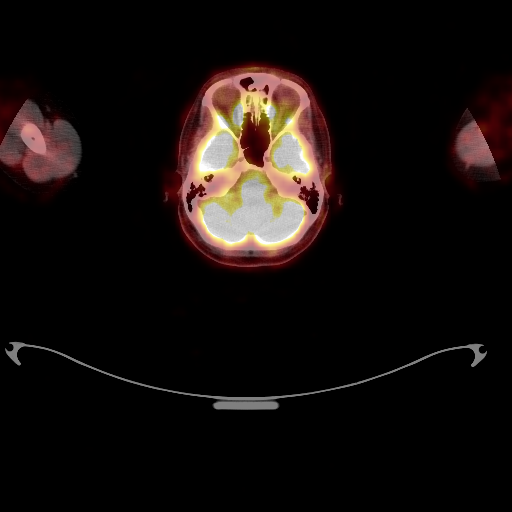

[Series 1078: results mm oncology reading · 0.9mm · 0.86mm/px · 1 of 4 slices shown]
[im 1/4]
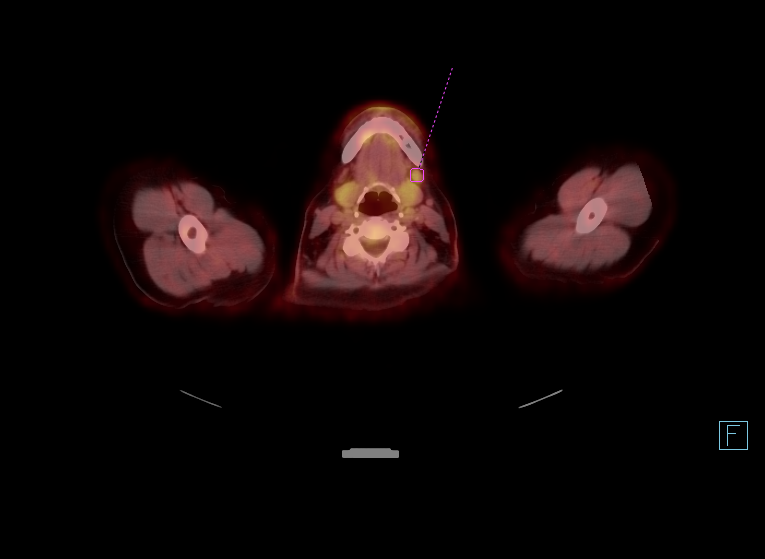

[25 of 25 positions shown; findings below may reference images not displayed]

FINDINGS: Mediastinal blood-pool activity (background): SUV max =

Liver activity (reference): SUV max = N/A

NECK: A 7 mm left level 1B submandibular lymph node is seen which
shows mild FDG uptake SUV max of 3.1. No other FDG avid for lymph
nodes or masses are identified within the neck.

Incidental CT findings:  None.

CHEST: No hypermetabolic lymphadenopathy. No suspicious pulmonary
nodules seen on CT images. A small focal site of FDG uptake is seen
within the glandular tissue in the upper inner quadrant of the right
breast on image 68/3, which has SUV max of 5.2.

Incidental CT findings:  None.

ABDOMEN/PELVIS: Focal hypermetabolic activity is seen in the anal
canal, with SUV max of 10.5. This corresponds to the known primary
anal carcinoma. No hypermetabolic lymph nodes in the pelvis or
abdomen.No abnormal hypermetabolic activity within the liver,
pancreas, adrenal glands, or spleen. Prior hysterectomy noted.
Low-grade FDG uptake is seen within the left ovary, which is
considered physiologic.

Incidental CT findings:  None.

SKELETON: No focal hypermetabolic bone lesions to suggest skeletal
metastasis.

Incidental CT findings:  None.
IMPRESSION: Hypermetabolic mass within the anal canal, consistent with known
primary anal carcinoma.

No definite evidence of local or distant metastatic disease.

7 mm left submandibular lymph node shows mild FDG uptake, likely
reactive in etiology. Recommend continued attention on follow-up
imaging.

Focal site of FDG uptake within the upper inner quadrant of the
right breast. A small primary breast carcinoma cannot be excluded.
Recommend further evaluation with diagnostic mammogram/breast
imaging.

## 2021-03-16 MED ORDER — FLUDEOXYGLUCOSE F - 18 (FDG) INJECTION
7.7000 | Freq: Once | INTRAVENOUS | Status: AC | PRN
  Administered 2021-03-16: 7.95 via INTRAVENOUS

## 2021-03-16 NOTE — Telephone Encounter (Signed)
Notified via VM of PICC line placement at [facility] radiology on 03/21/21 to arrive in admitting at 0800. OK to eat/drink prior. Sent message to collaborative RN to see if radiation oncology can move her treatment time to right after the PICC placement to simplify her appointments.

## 2021-03-16 NOTE — Telephone Encounter (Signed)
I called the patient to review her PET scan results. She has a reative appearing left submandibular node. She also has a focal area of uptake in the right breast. I've discussed with Dr. Benay Spice and Dr. Lisbeth Renshaw and we will proceed with diagnostic breast work up. She's already scheduled for a screening mammogram but hopefully on 03/23/21 they can do diagnostic imaging +/- need for u/s and or biopsy.     Carola Rhine, PAC

## 2021-03-18 ENCOUNTER — Telehealth: Payer: Self-pay | Admitting: Oncology

## 2021-03-18 DIAGNOSIS — C211 Malignant neoplasm of anal canal: Secondary | ICD-10-CM | POA: Diagnosis not present

## 2021-03-18 NOTE — Telephone Encounter (Signed)
Reminder call for patient about 4/4 appt - . Left message for patient with appt date and time and new address location.

## 2021-03-19 ENCOUNTER — Other Ambulatory Visit: Payer: Self-pay | Admitting: Oncology

## 2021-03-21 ENCOUNTER — Inpatient Hospital Stay (HOSPITAL_BASED_OUTPATIENT_CLINIC_OR_DEPARTMENT_OTHER): Payer: BC Managed Care – PPO | Admitting: Nurse Practitioner

## 2021-03-21 ENCOUNTER — Other Ambulatory Visit: Payer: Self-pay

## 2021-03-21 ENCOUNTER — Inpatient Hospital Stay: Payer: BC Managed Care – PPO

## 2021-03-21 ENCOUNTER — Inpatient Hospital Stay: Payer: BC Managed Care – PPO | Attending: Nurse Practitioner

## 2021-03-21 ENCOUNTER — Ambulatory Visit
Admission: RE | Admit: 2021-03-21 | Discharge: 2021-03-21 | Disposition: A | Discharge status: Home / Self Care | Payer: BC Managed Care – PPO | Source: Ambulatory Visit | Attending: Radiation Oncology | Admitting: Radiation Oncology

## 2021-03-21 ENCOUNTER — Other Ambulatory Visit: Payer: Self-pay | Admitting: Oncology

## 2021-03-21 ENCOUNTER — Ambulatory Visit (HOSPITAL_COMMUNITY)
Admission: RE | Admit: 2021-03-21 | Discharge: 2021-03-21 | Disposition: A | Discharge status: Home / Self Care | Payer: BC Managed Care – PPO | Source: Ambulatory Visit | Attending: Oncology | Admitting: Oncology

## 2021-03-21 VITALS — BP 151/103 | HR 96 | Temp 97.8°F | Resp 18 | Ht 67.0 in | Wt 159.0 lb

## 2021-03-21 DIAGNOSIS — L309 Dermatitis, unspecified: Secondary | ICD-10-CM | POA: Diagnosis not present

## 2021-03-21 DIAGNOSIS — C210 Malignant neoplasm of anus, unspecified: Secondary | ICD-10-CM

## 2021-03-21 DIAGNOSIS — L988 Other specified disorders of the skin and subcutaneous tissue: Secondary | ICD-10-CM | POA: Insufficient documentation

## 2021-03-21 DIAGNOSIS — Z5111 Encounter for antineoplastic chemotherapy: Secondary | ICD-10-CM | POA: Insufficient documentation

## 2021-03-21 DIAGNOSIS — Z79899 Other long term (current) drug therapy: Secondary | ICD-10-CM | POA: Diagnosis not present

## 2021-03-21 DIAGNOSIS — C211 Malignant neoplasm of anal canal: Secondary | ICD-10-CM | POA: Diagnosis not present

## 2021-03-21 DIAGNOSIS — Z7289 Other problems related to lifestyle: Secondary | ICD-10-CM | POA: Insufficient documentation

## 2021-03-21 DIAGNOSIS — N6312 Unspecified lump in the right breast, upper inner quadrant: Secondary | ICD-10-CM | POA: Insufficient documentation

## 2021-03-21 DIAGNOSIS — D696 Thrombocytopenia, unspecified: Secondary | ICD-10-CM | POA: Insufficient documentation

## 2021-03-21 DIAGNOSIS — L539 Erythematous condition, unspecified: Secondary | ICD-10-CM | POA: Diagnosis not present

## 2021-03-21 DIAGNOSIS — Z720 Tobacco use: Secondary | ICD-10-CM | POA: Insufficient documentation

## 2021-03-21 DIAGNOSIS — K625 Hemorrhage of anus and rectum: Secondary | ICD-10-CM | POA: Diagnosis not present

## 2021-03-21 LAB — CBC WITH DIFFERENTIAL (CANCER CENTER ONLY)
Abs Immature Granulocytes: 0.03 10*3/uL (ref 0.00–0.07)
Basophils Absolute: 0 10*3/uL (ref 0.0–0.1)
Basophils Relative: 1 %
Eosinophils Absolute: 0.3 10*3/uL (ref 0.0–0.5)
Eosinophils Relative: 3 %
HCT: 44.9 % (ref 36.0–46.0)
Hemoglobin: 15.1 g/dL — ABNORMAL HIGH (ref 12.0–15.0)
Immature Granulocytes: 0 %
Lymphocytes Relative: 18 %
Lymphs Abs: 1.4 10*3/uL (ref 0.7–4.0)
MCH: 33.1 pg (ref 26.0–34.0)
MCHC: 33.6 g/dL (ref 30.0–36.0)
MCV: 98.5 fL (ref 80.0–100.0)
Monocytes Absolute: 0.7 10*3/uL (ref 0.1–1.0)
Monocytes Relative: 10 %
Neutro Abs: 5 10*3/uL (ref 1.7–7.7)
Neutrophils Relative %: 68 %
Platelet Count: 186 10*3/uL (ref 150–400)
RBC: 4.56 MIL/uL (ref 3.87–5.11)
RDW: 12.5 % (ref 11.5–15.5)
WBC Count: 7.4 10*3/uL (ref 4.0–10.5)
nRBC: 0 % (ref 0.0–0.2)

## 2021-03-21 LAB — CMP (CANCER CENTER ONLY)
ALT: 73 U/L — ABNORMAL HIGH (ref 0–44)
AST: 40 U/L (ref 15–41)
Albumin: 4.7 g/dL (ref 3.5–5.0)
Alkaline Phosphatase: 65 U/L (ref 38–126)
Anion gap: 10 (ref 5–15)
BUN: 13 mg/dL (ref 6–20)
CO2: 24 mmol/L (ref 22–32)
Calcium: 9.7 mg/dL (ref 8.9–10.3)
Chloride: 103 mmol/L (ref 98–111)
Creatinine: 0.71 mg/dL (ref 0.44–1.00)
GFR, Estimated: >60 mL/min (ref >60)
Glucose, Bld: 123 mg/dL — ABNORMAL HIGH (ref 70–99)
Potassium: 4.3 mmol/L (ref 3.5–5.1)
Sodium: 137 mmol/L (ref 135–145)
Total Bilirubin: 0.5 mg/dL (ref 0.3–1.2)
Total Protein: 7.4 g/dL (ref 6.5–8.1)

## 2021-03-21 LAB — HIV ANTIBODY (ROUTINE TESTING W REFLEX): HIV Screen 4th Generation wRfx: NONREACTIVE

## 2021-03-21 IMAGING — US IR PICC >5YO
2 series · 2 of 2 positions shown · non-contrast
Comparison: none

INDICATION: Patient with anal cancer. Request PICC line placement for
intravenous chemotherapy.

[Series 1: (id) · 1 of 1 slices shown]
[im 1/1]
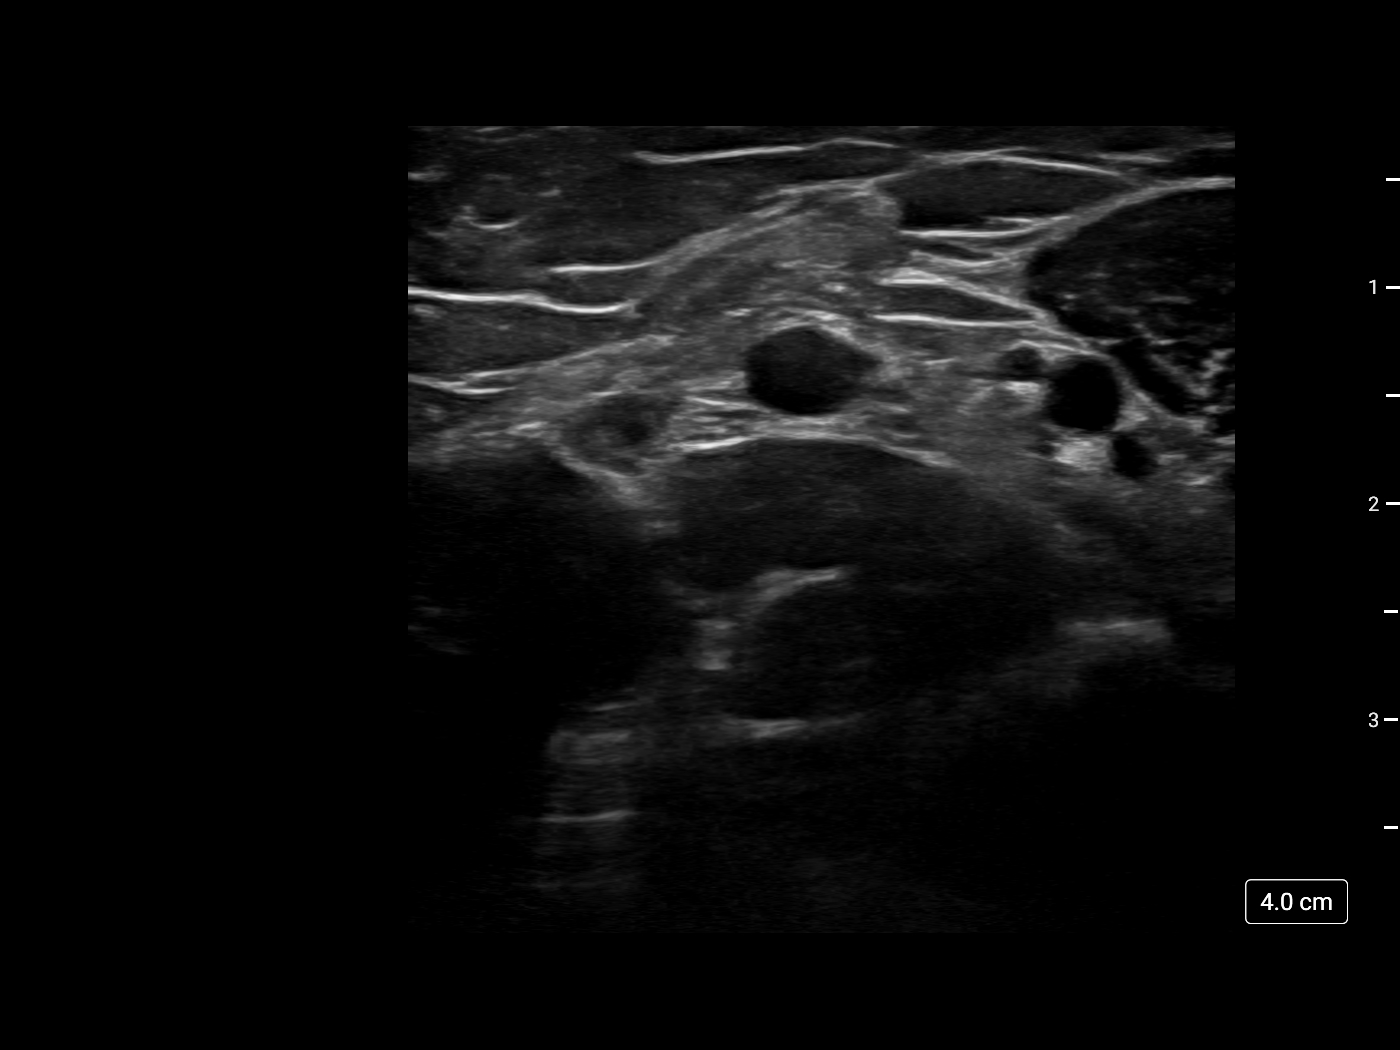

[Series 300: ir fluoro guide cv line left · 1 of 1 slices shown]
[im 1/1]
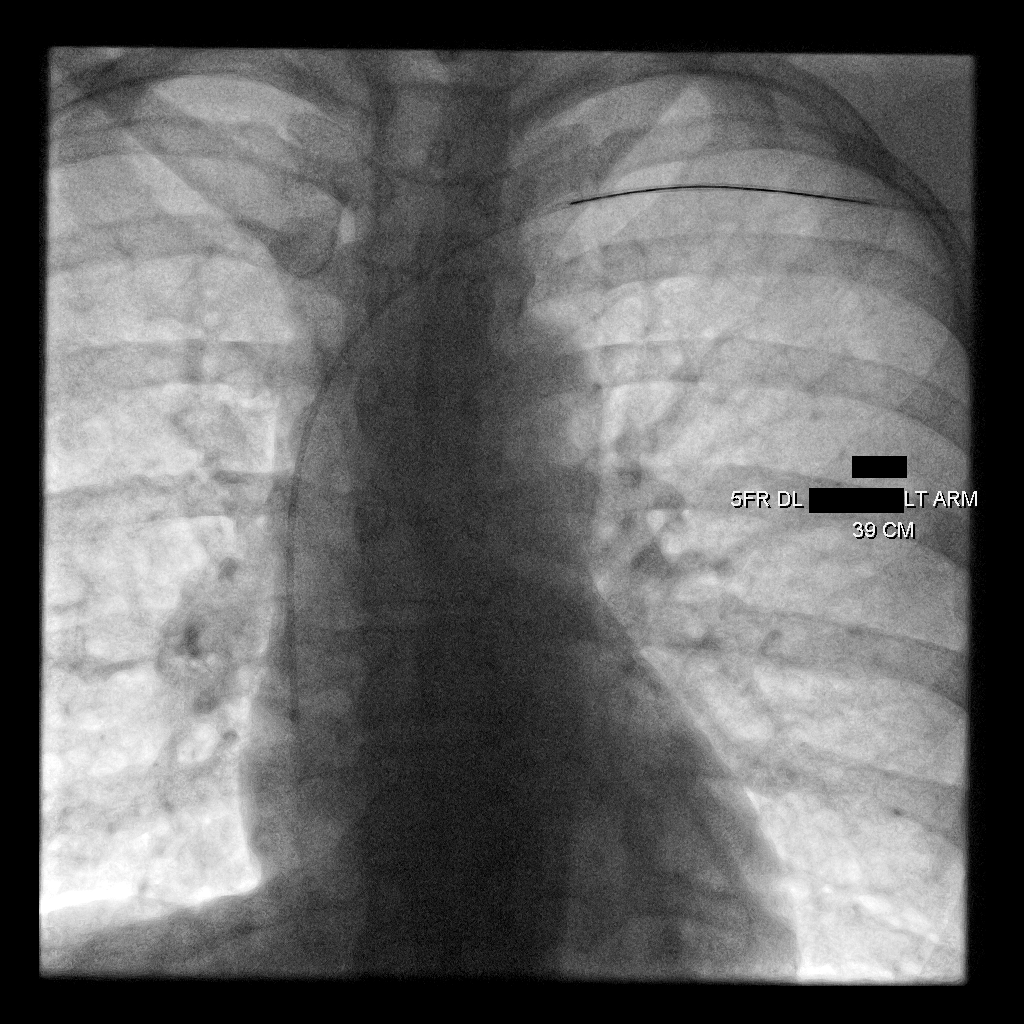

[2 of 2 positions shown; findings below may reference images not displayed]

EXAM:
LEFT UPPER EXTREMITY PICC LINE PLACEMENT WITH ULTRASOUND AND
FLUOROSCOPIC GUIDANCE

MEDICATIONS:
1% plain lidocaine, 1 mL; The antibiotic was administered within an
appropriate time interval prior to skin puncture.

ANESTHESIA/SEDATION:
Moderate Sedation Time: None the patient's level of consciousness
and vital signs were monitored continuously by radiology nursing
throughout the procedure under my direct supervision.

FLUOROSCOPY TIME:  Fluoroscopy Time: 6 seconds

COMPLICATIONS:
None immediate.

PROCEDURE:
The patient was advised of the possible risks and complications and
agreed to undergo the procedure. The patient was then brought to the
angiographic suite for the procedure.

The left arm was prepped with chlorhexidine, draped in the usual
sterile fashion using maximum barrier technique (cap and mask,
sterile gown, sterile gloves, large sterile sheet, hand hygiene and
cutaneous antisepsis) and infiltrated locally with 1% Lidocaine.

Ultrasound demonstrated patency of the left basilic vein, and this
was documented with an image. Under real-time ultrasound guidance,
this vein was accessed with a 21 gauge micropuncture needle and
image documentation was performed. A [DATE] wire was introduced in to
the vein. Over this, a 5 French dual lumen power injectable PICC was
advanced to the lower SVC/right atrial junction. Fluoroscopy during
the procedure and fluoro spot radiograph confirms appropriate
catheter position. The catheter was flushed and covered with a
sterile dressing.

Catheter length: 39 cm
IMPRESSION: Successful left arm power PICC line placement with ultrasound and
fluoroscopic guidance. The catheter is ready for use.

## 2021-03-21 MED ORDER — HEPARIN SOD (PORK) LOCK FLUSH 100 UNIT/ML IV SOLN
500.0000 [IU] | Freq: Once | INTRAVENOUS | Status: DC | PRN
  Filled 2021-03-21: qty 5

## 2021-03-21 MED ORDER — MITOMYCIN CHEMO IV INJECTION 20 MG
10.0000 mg/m2 | Freq: Once | INTRAVENOUS | Status: AC
  Administered 2021-03-21: 18.5 mg via INTRAVENOUS
  Filled 2021-03-21: qty 37

## 2021-03-21 MED ORDER — SODIUM CHLORIDE 0.9 % IV SOLN
Freq: Once | INTRAVENOUS | Status: AC
  Filled 2021-03-21: qty 250

## 2021-03-21 MED ORDER — SODIUM CHLORIDE 0.9% FLUSH
10.0000 mL | INTRAVENOUS | Status: DC | PRN
  Filled 2021-03-21: qty 10

## 2021-03-21 MED ORDER — PROCHLORPERAZINE MALEATE 10 MG PO TABS
10.0000 mg | ORAL_TABLET | Freq: Once | ORAL | Status: AC
  Administered 2021-03-21: 10 mg via ORAL

## 2021-03-21 MED ORDER — HEPARIN SOD (PORK) LOCK FLUSH 100 UNIT/ML IV SOLN
INTRAVENOUS | Status: AC
  Filled 2021-03-21: qty 5

## 2021-03-21 MED ORDER — SODIUM CHLORIDE 0.9 % IV SOLN
1000.0000 mg/m2/d | INTRAVENOUS | Status: DC
  Administered 2021-03-21: 7400 mg via INTRAVENOUS
  Filled 2021-03-21: qty 148

## 2021-03-21 MED ORDER — LIDOCAINE-EPINEPHRINE 1 %-1:100000 IJ SOLN
INTRAMUSCULAR | Status: AC
  Filled 2021-03-21: qty 1

## 2021-03-21 NOTE — Procedures (Signed)
Successful placement of dual lumen PICC line to left basilic vein. Length 39 cm Tip at lower SVC/RA PICC capped No complications Ready for use.  EBL < 5 mL   Ascencion Dike PA-C 03/21/2021 8:44 AM

## 2021-03-21 NOTE — Progress Notes (Addendum)
Beth [name] returns as scheduled.  She had a PICC line placed earlier today.  She denies nausea/vomiting.  No diarrhea.  She has a good appetite.  Objective:  Vital signs in last 24 hours:  Blood pressure (!) 151/103, pulse 96, temperature 97.8 F (36.6 C), temperature source Tympanic, resp. rate 18, height 5\' 7"  (1.702 m), weight 159 lb (72.1 kg), last menstrual period 03/13/2012, SpO2 100 %.    HEENT: No thrush or ulcers. Lymphatics: No palpable cervical or supraclavicular lymph nodes. Resp: Lungs clear bilaterally. Cardio: Regular rate and rhythm. GI: Abdomen soft and nontender.  No hepatomegaly. Vascular: No leg edema. Breast: No mass palpated right breast. Neuro: Alert and oriented. Left upper extremity PICC   Lab Results:  Lab Results  Component Value Date   WBC 7.4 03/21/2021   HGB 15.1 (H) 03/21/2021   HCT 44.9 03/21/2021   MCV 98.5 03/21/2021   PLT 186 03/21/2021   NEUTROABS 5.0 03/21/2021    Imaging:  [facility] PLACEMENT LEFT >5 YRS INC IMG GUIDE  Result Date: 03/21/2021 INDICATION: Patient with anal cancer. Request PICC line placement for intravenous chemotherapy. EXAM: LEFT UPPER EXTREMITY PICC LINE PLACEMENT WITH ULTRASOUND AND FLUOROSCOPIC GUIDANCE MEDICATIONS: 1% plain lidocaine, 1 mL; The antibiotic was administered within an appropriate time interval prior to skin puncture. ANESTHESIA/SEDATION: Moderate Sedation Time: None the patient's level of consciousness and vital signs were monitored continuously by radiology nursing throughout the procedure under my direct supervision. FLUOROSCOPY TIME:  Fluoroscopy Time: 6 seconds COMPLICATIONS: None immediate. PROCEDURE: The patient was advised of the possible risks and complications and agreed to undergo the procedure. The patient was then brought to the angiographic suite for the procedure. The left arm was prepped with  chlorhexidine, draped in the usual sterile fashion using maximum barrier technique (cap and mask, sterile gown, sterile gloves, large sterile sheet, hand hygiene and cutaneous antisepsis) and infiltrated locally with 1% Lidocaine. Ultrasound demonstrated patency of the left basilic vein, and this was documented with an image. Under real-time ultrasound guidance, this vein was accessed with a 21 gauge micropuncture needle and image documentation was performed. A 0.018 wire was introduced in to the vein. Over this, a 5 [place] dual lumen power injectable PICC was advanced to the lower SVC/right atrial junction. Fluoroscopy during the procedure and fluoro spot radiograph confirms appropriate catheter position. The catheter was flushed and covered with a sterile dressing. Catheter length: 39 cm IMPRESSION: Successful left arm power PICC line placement with ultrasound and fluoroscopic guidance. The catheter is ready for use. Read by: Ascencion Dike PA-C Electronically Signed   By: Jerilynn Mages.  Shick M.D.   On: 03/21/2021 08:46    Medications: I have reviewed the patient's current medications.  Assessment/Plan: 1. Squamous cell carcinoma of the anal canal ? Colonoscopy 01/28/2021-rectal mass extending to the "outside ", palpable on exam-biopsy revealed high-grade squamous intraepithelial lesion (AIN 3/carcinoma in situ) ? 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 CTs 03/07/2021-ill-definition of tissue planes along the anus with abnormal soft tissue prominence posteriorly along the anal canal suspicious for mass.  Small adjacent lower perirectal lymph nodes in addition to a 0.5 cm sacral node.  Focal enhancing lesion in the right hepatic lobe 1.2 x 0.8 x 0.9 cm.  2.1 x 1.5 cm cystic lesion right ovary with thin enhancing rim. ? PET scan 03/16/2021-hypermetabolic mass within the anal canal.  No definite evidence of local or distant metastatic disease.  7 mm left submandibular lymph node with mild FDG uptake, likely reactive.  Focal site of FDG uptake within the inner quadrant of the right breast. ? Radiation 03/21/2021 ? Cycle one 5-FU/mitomycin-C 03/21/2021 2. Remote history of an anal fissure repair 3. Tobacco use 4. Moderate alcohol use 5. Eczema   Disposition: Beth [name] appears stable.  She is scheduled for cycle one 5-FU/mitomycin-C today.  She is also scheduled to begin radiation.  We again reviewed potential toxicities associated with chemotherapy.  She agrees to proceed.  We reviewed the CBC from today.  Counts adequate to proceed with treatment.  We reviewed the PET scan results.  There is no evidence of local or distant metastatic disease.  There is a focal area of FDG uptake within the right breast.  She is scheduled for a diagnostic mammogram later this week.  She will return for a nadir CBC on 03/31/2021.  We will see her in follow-up on 04/04/2021.  She will contact the office in the interim with any problems.  Patient seen with Dr. Benay Spice.    Ned Card ANP/GNP-BC   03/21/2021  11:01 AM This was a shared visit with Ned Card.  Beth [name] will begin treatment with 5-FU/mitomycin-C and radiation today.  We reviewed the staging PET results and images with her.  The focal uptake at the right breast and the small left submandibular node are likely benign findings.  We will consider additional diagnostic evaluation depending on the mammogram finding.  Her case was presented at the GI tumor conference.  The pathology from the rectal mass excision revealed squamous cell carcinoma with a  high-grade neuroendocrine component.  The plan is to proceed with standard chemotherapy/radiation with close clinical follow-up.  We will consider treatment with etoposide/carboplatin depending on the clinical response to the current chemotherapy and radiation.  I was present for greater than 50% of today's visit.  I performed medical decision making.  Julieanne Manson, MD

## 2021-03-21 NOTE — Patient Instructions (Addendum)
[facility] Discharge Instructions for Patients receiving Home Portable Chemo Pump    **The bag should finish at 46 hours, 96 hours or 7 days. For example, if your pump is scheduled for 46 hours and it was put on at 4pm, it should finish at 2 pm the day it is scheduled to come off regardless of your appointment time.    Estimated time to finish   _________________________ (Have your nurse fill in)     ** if the display on your pump reads "Low Volume" and it is beeping, take the batteries out of the pump and come to the cancer center for it to be taken off.   **If the pump alarms go off prior to the pump reading "Low Volume" then call the [phone] and someone can assist you.  **If the plunger comes out and the bag fluid is running out, please use your chemo spill kit to clean up the spill. Do not use paper towels or other house hold products.  ** If you have problems or questions regarding your pump, please call either the [phone] or the cancer center Monday-Friday 8:00am-4:30pm at [phone] and we will assist you.  If you are unable to get assistance then go to [facility] Emergency Room, ask the staff to contact the IV team for assistance.   [facility] Discharge Instructions for Patients Receiving Chemotherapy  Today you received the following chemotherapy agents  Mutamycin and Fluorouracil  To help prevent nausea and vomiting after your treatment, we encourage you to take your nausea medication Compazine, take one at dinner time after chemo and then every 6 hours as needed for nausea   If you develop nausea and vomiting that is not controlled by your nausea medication, call the clinic.   BELOW ARE SYMPTOMS THAT SHOULD BE REPORTED IMMEDIATELY:  *FEVER GREATER THAN 100.5 F  *CHILLS WITH OR WITHOUT FEVER  NAUSEA AND VOMITING THAT IS NOT CONTROLLED WITH YOUR NAUSEA MEDICATION  *UNUSUAL SHORTNESS OF BREATH  *UNUSUAL  BRUISING OR BLEEDING  TENDERNESS IN MOUTH AND THROAT WITH OR WITHOUT PRESENCE OF ULCERS  *URINARY PROBLEMS  *BOWEL PROBLEMS  UNUSUAL RASH Items with * indicate a potential emergency and should be followed up as soon as possible.  Feel free to call the clinic should you have any questions or concerns at The clinic phone number is [phone].  Please show the [facility] at check-in to the Emergency Department and triage nurse.

## 2021-03-22 ENCOUNTER — Ambulatory Visit: Payer: BC Managed Care – PPO | Admitting: Gastroenterology

## 2021-03-22 ENCOUNTER — Telehealth: Payer: Self-pay | Admitting: *Deleted

## 2021-03-22 ENCOUNTER — Telehealth: Payer: Self-pay | Admitting: Oncology

## 2021-03-22 ENCOUNTER — Ambulatory Visit
Admission: RE | Admit: 2021-03-22 | Discharge: 2021-03-22 | Disposition: A | Discharge status: Home / Self Care | Payer: BC Managed Care – PPO | Source: Ambulatory Visit | Attending: Radiation Oncology | Admitting: Radiation Oncology

## 2021-03-22 DIAGNOSIS — C211 Malignant neoplasm of anal canal: Secondary | ICD-10-CM | POA: Diagnosis not present

## 2021-03-22 NOTE — Telephone Encounter (Signed)
Appointments scheduled with patient while in exam room per 4/4 los

## 2021-03-23 ENCOUNTER — Ambulatory Visit
Admission: RE | Admit: 2021-03-23 | Discharge: 2021-03-23 | Disposition: A | Discharge status: Home / Self Care | Payer: BC Managed Care – PPO | Source: Ambulatory Visit | Attending: Radiation Oncology | Admitting: Radiation Oncology

## 2021-03-23 ENCOUNTER — Other Ambulatory Visit: Payer: Self-pay

## 2021-03-23 ENCOUNTER — Other Ambulatory Visit: Payer: Self-pay | Admitting: Radiation Oncology

## 2021-03-23 ENCOUNTER — Ambulatory Visit
Admission: RE | Admit: 2021-03-23 | Discharge: 2021-03-23 | Disposition: A | Discharge status: Home / Self Care | Payer: BC Managed Care – PPO | Source: Ambulatory Visit | Attending: Obstetrics and Gynecology | Admitting: Obstetrics and Gynecology

## 2021-03-23 DIAGNOSIS — Z1239 Encounter for other screening for malignant neoplasm of breast: Secondary | ICD-10-CM

## 2021-03-23 DIAGNOSIS — R948 Abnormal results of function studies of other organs and systems: Secondary | ICD-10-CM

## 2021-03-23 DIAGNOSIS — C211 Malignant neoplasm of anal canal: Secondary | ICD-10-CM | POA: Diagnosis not present

## 2021-03-24 ENCOUNTER — Ambulatory Visit
Admission: RE | Admit: 2021-03-24 | Discharge: 2021-03-24 | Disposition: A | Discharge status: Home / Self Care | Payer: BC Managed Care – PPO | Source: Ambulatory Visit | Attending: Radiation Oncology | Admitting: Radiation Oncology

## 2021-03-24 ENCOUNTER — Encounter (HOSPITAL_COMMUNITY): Admission: RE | Admit: 2021-03-24 | Payer: BC Managed Care – PPO | Source: Ambulatory Visit

## 2021-03-24 ENCOUNTER — Telehealth: Payer: Self-pay

## 2021-03-24 DIAGNOSIS — C211 Malignant neoplasm of anal canal: Secondary | ICD-10-CM | POA: Diagnosis not present

## 2021-03-24 NOTE — Telephone Encounter (Signed)
TC from Pt stating she was getting a blister in the corner of the dressing. Informed Pt to lift the corner up from the dressing and from now on we will use sensitive dressing on her port. Pt verbalized understanding will try to come in early tomorrow.

## 2021-03-25 ENCOUNTER — Ambulatory Visit
Admission: RE | Admit: 2021-03-25 | Discharge: 2021-03-25 | Disposition: A | Discharge status: Home / Self Care | Payer: BC Managed Care – PPO | Source: Ambulatory Visit | Attending: Radiation Oncology | Admitting: Radiation Oncology

## 2021-03-25 ENCOUNTER — Ambulatory Visit: Payer: BC Managed Care – PPO | Admitting: Oncology

## 2021-03-25 ENCOUNTER — Other Ambulatory Visit: Payer: Self-pay | Admitting: Oncology

## 2021-03-25 ENCOUNTER — Other Ambulatory Visit: Payer: BC Managed Care – PPO

## 2021-03-25 ENCOUNTER — Other Ambulatory Visit: Payer: Self-pay | Admitting: Nurse Practitioner

## 2021-03-25 ENCOUNTER — Inpatient Hospital Stay: Payer: BC Managed Care – PPO

## 2021-03-25 ENCOUNTER — Other Ambulatory Visit: Payer: Self-pay

## 2021-03-25 ENCOUNTER — Ambulatory Visit: Payer: BC Managed Care – PPO

## 2021-03-25 VITALS — BP 124/89 | HR 93 | Temp 98.5°F | Resp 20

## 2021-03-25 DIAGNOSIS — Z720 Tobacco use: Secondary | ICD-10-CM | POA: Diagnosis not present

## 2021-03-25 DIAGNOSIS — K625 Hemorrhage of anus and rectum: Secondary | ICD-10-CM | POA: Diagnosis not present

## 2021-03-25 DIAGNOSIS — Z5111 Encounter for antineoplastic chemotherapy: Secondary | ICD-10-CM | POA: Diagnosis not present

## 2021-03-25 DIAGNOSIS — D696 Thrombocytopenia, unspecified: Secondary | ICD-10-CM | POA: Diagnosis not present

## 2021-03-25 DIAGNOSIS — Z7289 Other problems related to lifestyle: Secondary | ICD-10-CM | POA: Diagnosis not present

## 2021-03-25 DIAGNOSIS — C210 Malignant neoplasm of anus, unspecified: Secondary | ICD-10-CM

## 2021-03-25 DIAGNOSIS — C211 Malignant neoplasm of anal canal: Secondary | ICD-10-CM | POA: Diagnosis not present

## 2021-03-25 DIAGNOSIS — N6312 Unspecified lump in the right breast, upper inner quadrant: Secondary | ICD-10-CM | POA: Diagnosis not present

## 2021-03-25 DIAGNOSIS — L988 Other specified disorders of the skin and subcutaneous tissue: Secondary | ICD-10-CM | POA: Diagnosis not present

## 2021-03-25 DIAGNOSIS — Z79899 Other long term (current) drug therapy: Secondary | ICD-10-CM | POA: Diagnosis not present

## 2021-03-25 DIAGNOSIS — L309 Dermatitis, unspecified: Secondary | ICD-10-CM | POA: Diagnosis not present

## 2021-03-25 DIAGNOSIS — L539 Erythematous condition, unspecified: Secondary | ICD-10-CM | POA: Diagnosis not present

## 2021-03-25 MED ORDER — HEPARIN SOD (PORK) LOCK FLUSH 100 UNIT/ML IV SOLN
500.0000 [IU] | Freq: Once | INTRAVENOUS | Status: AC | PRN
  Administered 2021-03-25: 250 [IU]
  Filled 2021-03-25: qty 5

## 2021-03-25 MED ORDER — SODIUM CHLORIDE 0.9% FLUSH
10.0000 mL | INTRAVENOUS | Status: DC | PRN
  Administered 2021-03-25: 10 mL
  Filled 2021-03-25: qty 10

## 2021-03-25 MED ORDER — OXYCODONE HCL 5 MG PO TABS: 5.0000 mg | ORAL_TABLET | Freq: Four times a day (QID) | ORAL | 0 refills | Status: DC | PRN

## 2021-03-25 NOTE — Patient Instructions (Signed)

## 2021-03-25 NOTE — Progress Notes (Signed)
1304 Patient sitting up post PICC line removal. Patient in stable condition talking with Ned Card NP.  [phone] Patient discharged ambulatory in stable condition.

## 2021-03-25 NOTE — Progress Notes (Signed)
1230 PICC line removed. See IV flowsheet. Patient tolerated procedure without incident. Patient will lie supine until 1300.

## 2021-03-28 ENCOUNTER — Telehealth: Payer: Self-pay

## 2021-03-28 ENCOUNTER — Ambulatory Visit
Admission: RE | Admit: 2021-03-28 | Discharge: 2021-03-28 | Disposition: A | Discharge status: Home / Self Care | Payer: BC Managed Care – PPO | Source: Ambulatory Visit | Attending: Radiation Oncology | Admitting: Radiation Oncology

## 2021-03-28 ENCOUNTER — Other Ambulatory Visit: Payer: Self-pay

## 2021-03-28 DIAGNOSIS — C50911 Malignant neoplasm of unspecified site of right female breast: Secondary | ICD-10-CM

## 2021-03-28 DIAGNOSIS — R948 Abnormal results of function studies of other organs and systems: Secondary | ICD-10-CM

## 2021-03-28 DIAGNOSIS — R922 Inconclusive mammogram: Secondary | ICD-10-CM | POA: Diagnosis not present

## 2021-03-28 DIAGNOSIS — C211 Malignant neoplasm of anal canal: Secondary | ICD-10-CM | POA: Diagnosis not present

## 2021-03-28 DIAGNOSIS — N6489 Other specified disorders of breast: Secondary | ICD-10-CM | POA: Diagnosis not present

## 2021-03-28 IMAGING — US US BREAST*R* LIMITED INC AXILLA
1 series · 2 of 2 positions shown · non-contrast
Comparison: PET-CT on [DATE], screening mammogram [DATE]

CLINICAL DATA: Patient has a recent diagnosis of anal carcinoma and
is undergoing radiation treatment. Recent PET-CT shows a small focal
area of uptake in the UPPER INNER QUADRANT of the RIGHT breast.

EXAM:
DIGITAL DIAGNOSTIC BILATERAL MAMMOGRAM WITH TOMOSYNTHESIS AND CAD;
ULTRASOUND RIGHT BREAST LIMITED
TECHNIQUE: Bilateral digital diagnostic mammography and breast tomosynthesis
was performed. The images were evaluated with computer-aided
detection.; Targeted ultrasound examination of the right breast was
performed

[Series 1: us breast*right* limited inc axilla · 0.07mm/px · 2 of 2 slices shown]
[im 1/2]
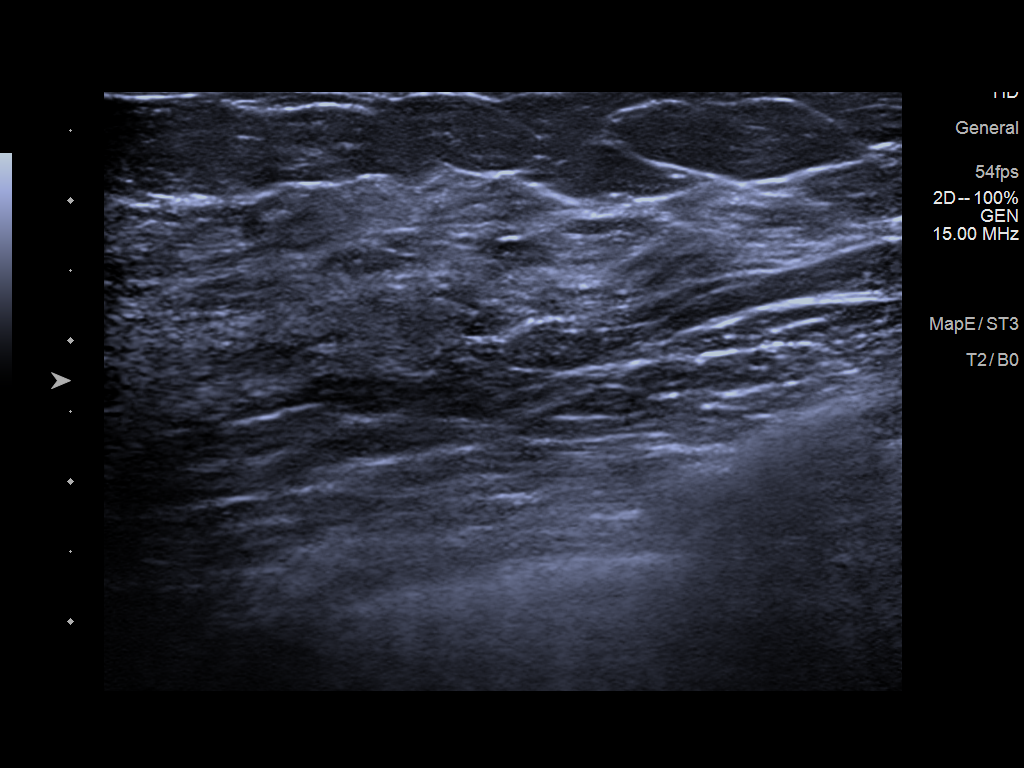
[im 2/2]
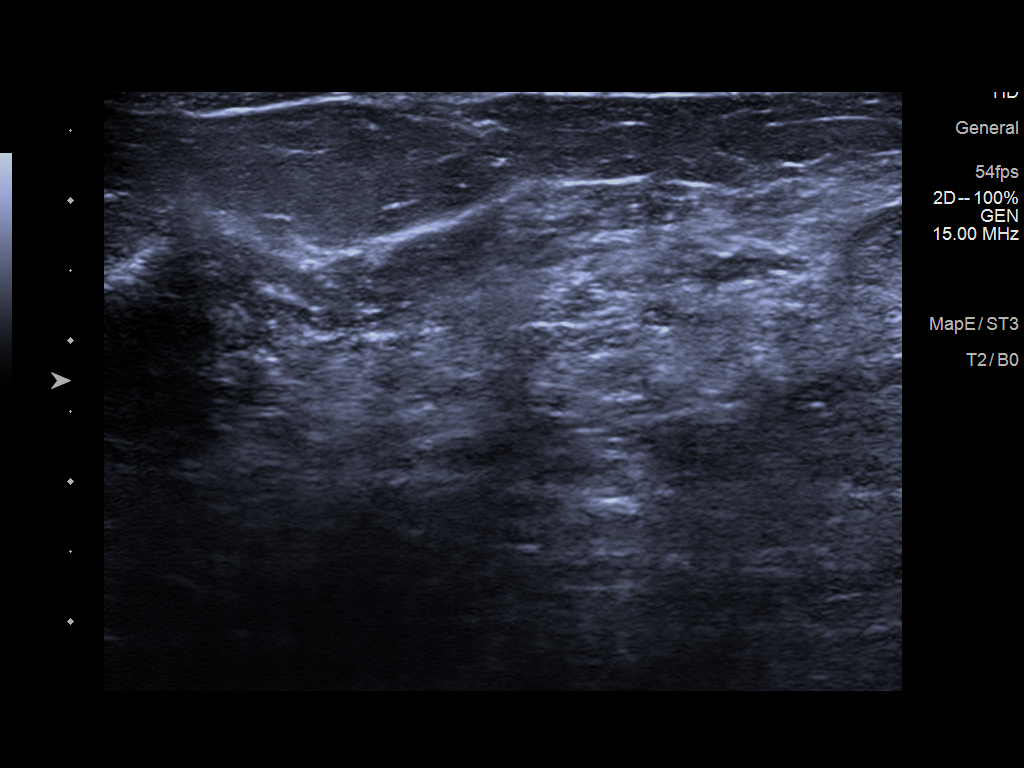

[2 of 2 positions shown; findings below may reference images not displayed]

ACR Breast Density Category c: The breast tissue is heterogeneously
dense, which may obscure small masses.
FINDINGS: No suspicious mass, distortion, or microcalcifications are
identified to suggest presence of malignancy. Specifically, in the
UPPER INNER QUADRANT of the RIGHT breast, no mass is identified on
standard or spot compression views. LEFT breast is negative.

On physical exam, I palpate no abnormality in the MEDIAL aspect of
the RIGHT breast.

Targeted ultrasound is performed, showing normal appearing common
dense fibroglandular tissue throughout the MEDIAL quadrants of the
RIGHT breast. There is no sonographic correlate for the PET-CT
finding.
IMPRESSION: 1. No sonographic or mammographic correlate for the PET-CT
abnormality. However, given the PET-CT findings, further evaluation
is needed to exclude RIGHT breast malignancy.
2.  No mammographic or ultrasound evidence for malignancy.

RECOMMENDATION:
Recommend breast MRI with and without contrast. Patient is currently
undergoing radiation treatment at [HOSPITAL], and may request MRI
study to be performed at that facility.

I have discussed the findings and recommendations with the patient.
If applicable, a reminder letter will be sent to the patient
regarding the next appointment.

BI-RADS CATEGORY  1: Negative.

## 2021-03-28 IMAGING — MG DIGITAL DIAGNOSTIC BILAT W/ TOMO W/ CAD
6 of 12 series · 6 of 36 positions shown · non-contrast
Comparison: PET-CT on [DATE], screening mammogram [DATE]

CLINICAL DATA: Patient has a recent diagnosis of anal carcinoma and
is undergoing radiation treatment. Recent PET-CT shows a small focal
area of uptake in the UPPER INNER QUADRANT of the RIGHT breast.

EXAM:
DIGITAL DIAGNOSTIC BILATERAL MAMMOGRAM WITH TOMOSYNTHESIS AND CAD;
ULTRASOUND RIGHT BREAST LIMITED
TECHNIQUE: Bilateral digital diagnostic mammography and breast tomosynthesis
was performed. The images were evaluated with computer-aided
detection.; Targeted ultrasound examination of the right breast was
performed

[R CC synth-2D (1 of 2)]
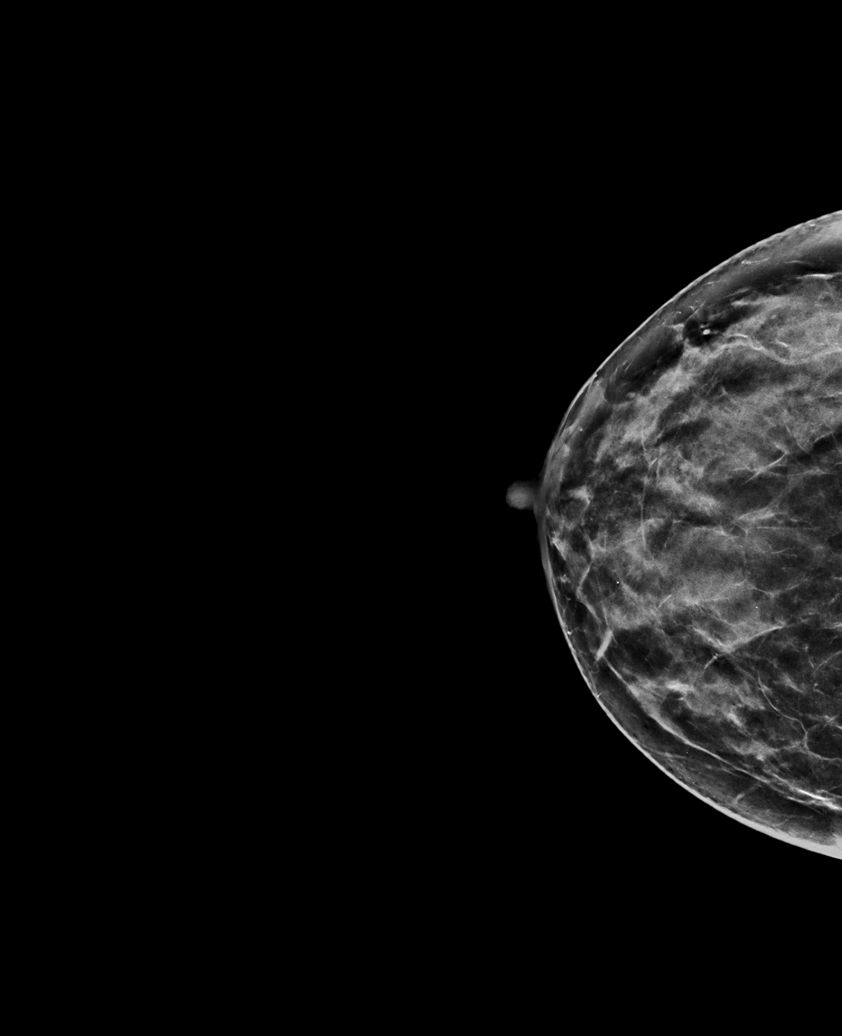

[L CC synth-2D]
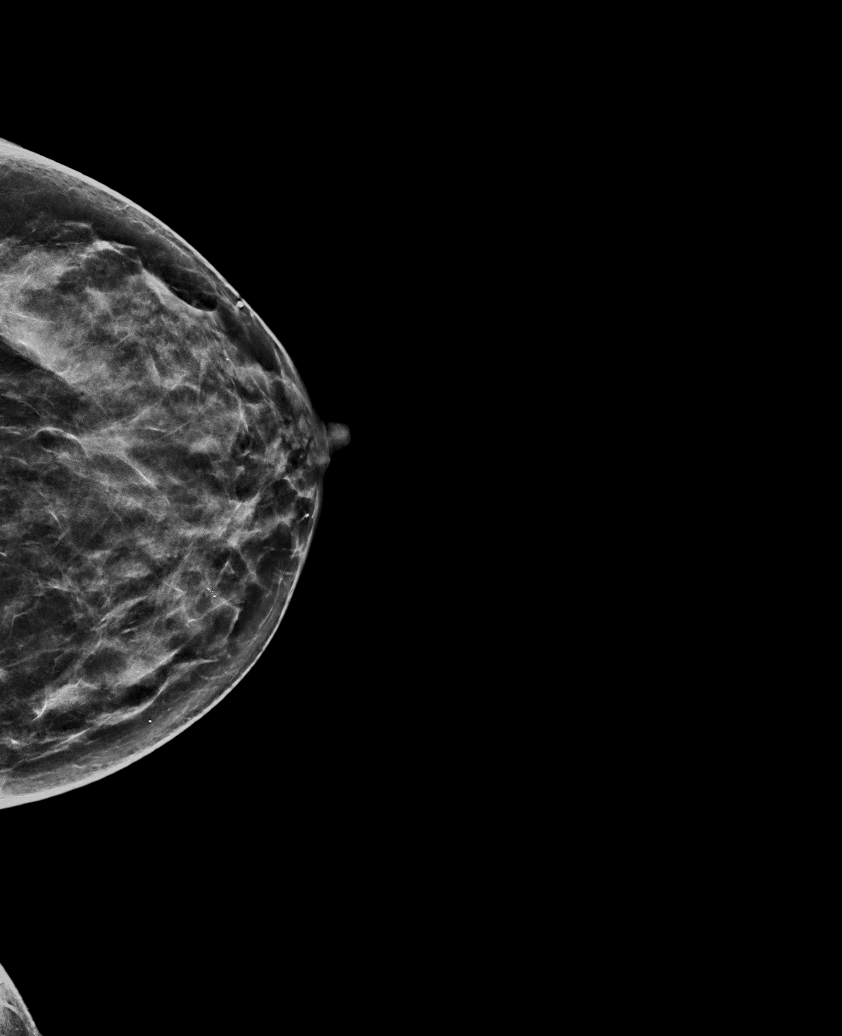

[R MLO synth-2D (1 of 2)]
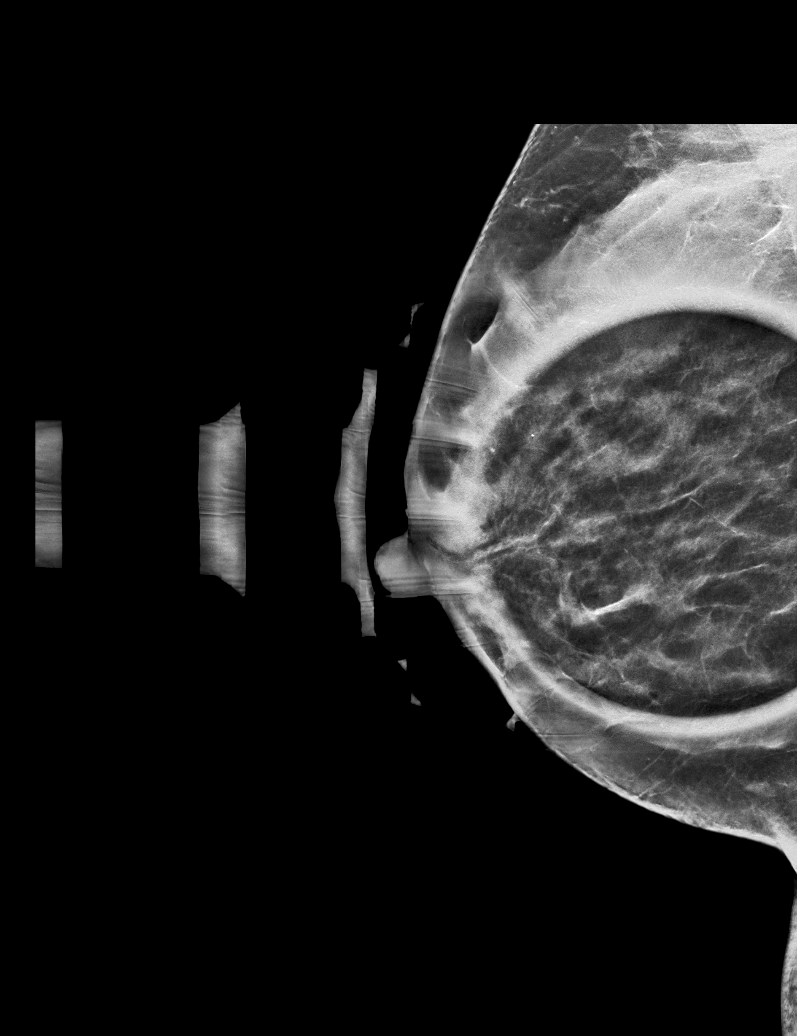

[L MLO synth-2D]
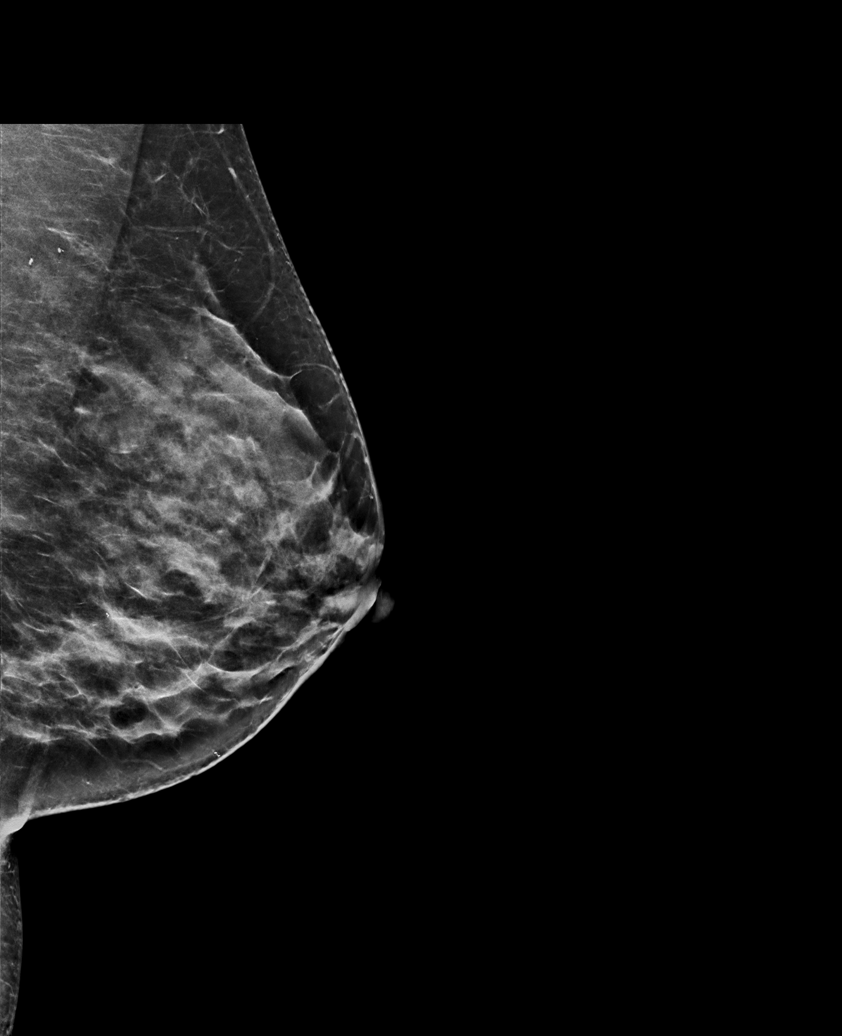

[R CC synth-2D (2 of 2)]
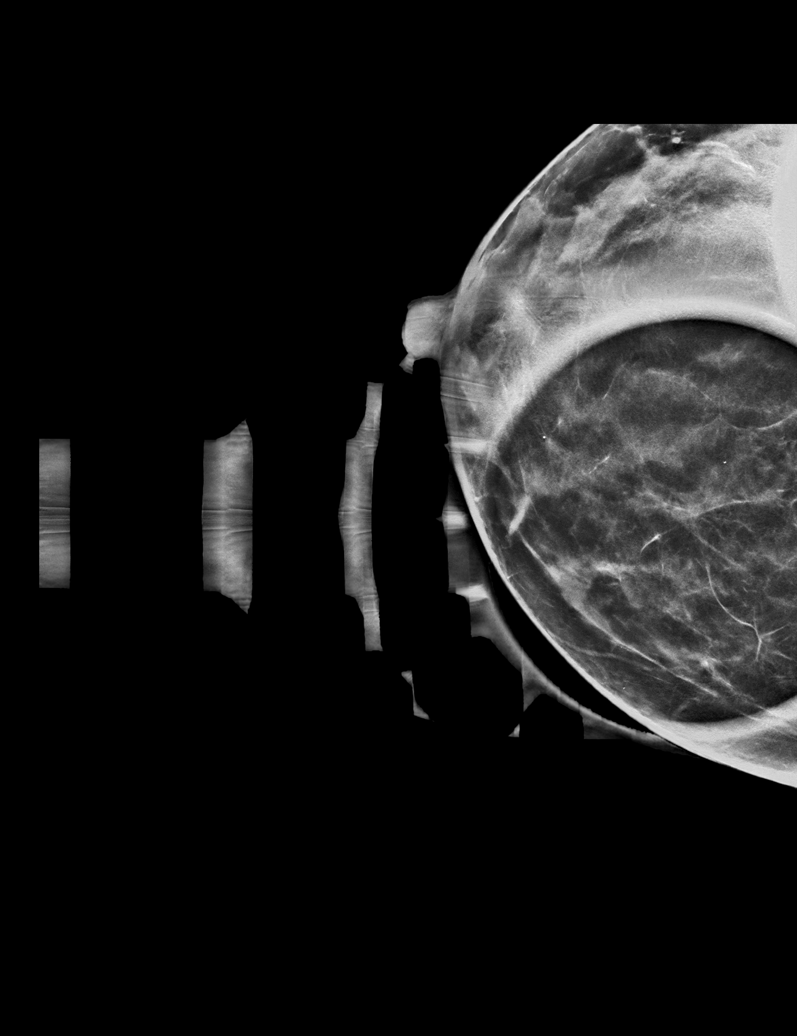

[R MLO synth-2D (2 of 2)]
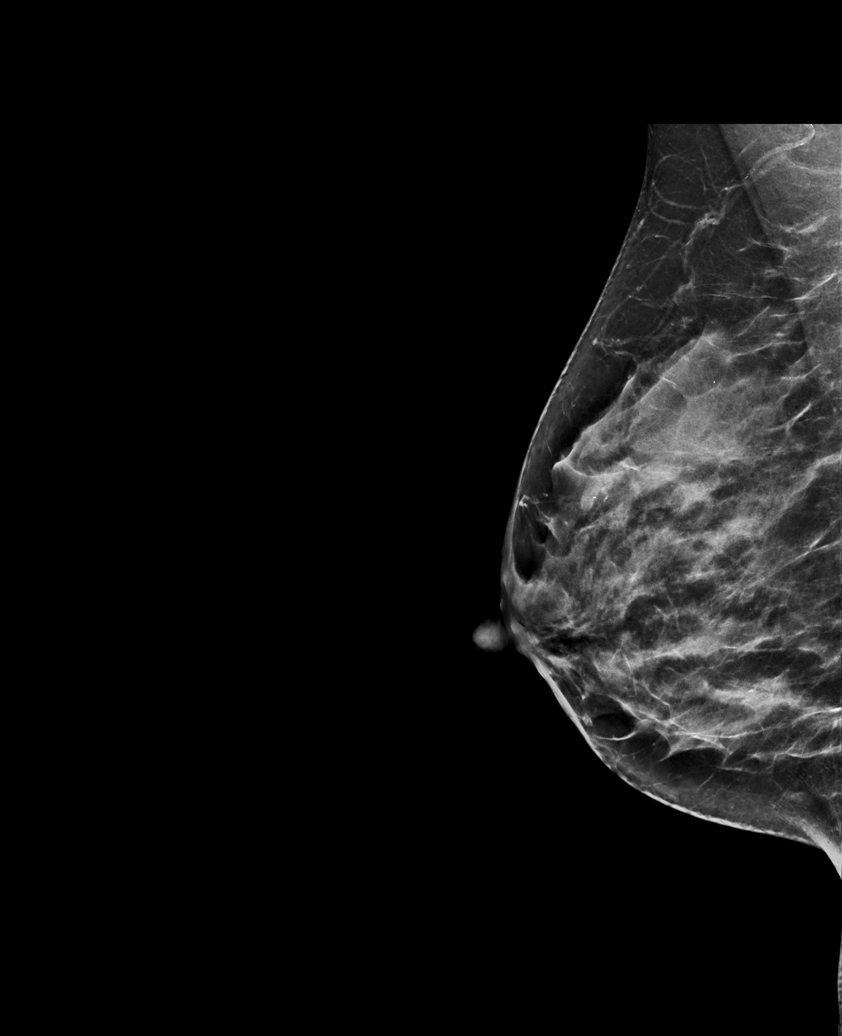

[6 of 36 positions shown; findings below may reference images not displayed]

ACR Breast Density Category c: The breast tissue is heterogeneously
dense, which may obscure small masses.
FINDINGS: No suspicious mass, distortion, or microcalcifications are
identified to suggest presence of malignancy. Specifically, in the
UPPER INNER QUADRANT of the RIGHT breast, no mass is identified on
standard or spot compression views. LEFT breast is negative.

On physical exam, I palpate no abnormality in the MEDIAL aspect of
the RIGHT breast.

Targeted ultrasound is performed, showing normal appearing common
dense fibroglandular tissue throughout the MEDIAL quadrants of the
RIGHT breast. There is no sonographic correlate for the PET-CT
finding.
IMPRESSION: 1. No sonographic or mammographic correlate for the PET-CT
abnormality. However, given the PET-CT findings, further evaluation
is needed to exclude RIGHT breast malignancy.
2.  No mammographic or ultrasound evidence for malignancy.

RECOMMENDATION:
Recommend breast MRI with and without contrast. Patient is currently
undergoing radiation treatment at [HOSPITAL], and may request MRI
study to be performed at that facility.

I have discussed the findings and recommendations with the patient.
If applicable, a reminder letter will be sent to the patient
regarding the next appointment.

BI-RADS CATEGORY  1: Negative.

## 2021-03-28 MED ORDER — MAGIC MOUTHWASH: 5.0000 mL | Freq: Four times a day (QID) | ORAL | 1 refills | Status: DC | PRN

## 2021-03-29 ENCOUNTER — Telehealth: Payer: Self-pay | Admitting: *Deleted

## 2021-03-29 ENCOUNTER — Other Ambulatory Visit: Payer: Self-pay | Admitting: Radiation Oncology

## 2021-03-29 ENCOUNTER — Ambulatory Visit
Admission: RE | Admit: 2021-03-29 | Discharge: 2021-03-29 | Disposition: A | Discharge status: Home / Self Care | Payer: BC Managed Care – PPO | Source: Ambulatory Visit | Attending: Radiation Oncology | Admitting: Radiation Oncology

## 2021-03-29 ENCOUNTER — Telehealth: Payer: Self-pay | Admitting: Radiation Oncology

## 2021-03-29 DIAGNOSIS — C211 Malignant neoplasm of anal canal: Secondary | ICD-10-CM | POA: Diagnosis not present

## 2021-03-29 DIAGNOSIS — C50911 Malignant neoplasm of unspecified site of right female breast: Secondary | ICD-10-CM

## 2021-03-29 NOTE — Telephone Encounter (Signed)
Called patient to inform of MRI for 03-31-21 - arrival time- 12:30 pm @ [facility], no restrictions to test, lvm for a return call

## 2021-03-29 NOTE — Telephone Encounter (Addendum)
I called the patient and let her know that I received her results from her mammogram/ultrasound and that the radiologist recommended proceeding with an MRI of the breasts. She is in agreement with this plan, and I've placed an order. She also mentioned that she's having mouth sores from chemo, and itchy rashing of her arms right greater than left. I'll let Dr. Benay Spice know and see her today at the treatment machine.  Addendum: When the patient came in she showed me her arms and also that she is not having any symptoms of skin irritation in the groin or anal region. She does have some hyperpigmentation and dermatitis of the left upper inner arm at her prior PICC line site. She also has raised macules of both forearms and into her hands right greater than left and these are itchy per report. I encouraged her to use topical hydrocortisone as needed and prn benadryl or claritin if she needed to take oral medication. I'll send this also to Dr. Benay Spice.          Carola Rhine, PAC

## 2021-03-30 ENCOUNTER — Telehealth: Payer: Self-pay | Admitting: *Deleted

## 2021-03-30 ENCOUNTER — Other Ambulatory Visit: Payer: Self-pay

## 2021-03-30 ENCOUNTER — Ambulatory Visit
Admission: RE | Admit: 2021-03-30 | Discharge: 2021-03-30 | Disposition: A | Discharge status: Home / Self Care | Payer: BC Managed Care – PPO | Source: Ambulatory Visit | Attending: Radiation Oncology | Admitting: Radiation Oncology

## 2021-03-30 DIAGNOSIS — C211 Malignant neoplasm of anal canal: Secondary | ICD-10-CM | POA: Diagnosis not present

## 2021-03-30 NOTE — Telephone Encounter (Signed)
Returned patient's phone call, spoke with patient 

## 2021-03-31 ENCOUNTER — Ambulatory Visit
Admission: RE | Admit: 2021-03-31 | Discharge: 2021-03-31 | Disposition: A | Discharge status: Home / Self Care | Payer: BC Managed Care – PPO | Source: Ambulatory Visit | Attending: Radiation Oncology | Admitting: Radiation Oncology

## 2021-03-31 ENCOUNTER — Ambulatory Visit (HOSPITAL_COMMUNITY)
Admission: RE | Admit: 2021-03-31 | Discharge: 2021-03-31 | Disposition: A | Discharge status: Home / Self Care | Payer: BC Managed Care – PPO | Source: Ambulatory Visit | Attending: Radiation Oncology | Admitting: Radiation Oncology

## 2021-03-31 ENCOUNTER — Other Ambulatory Visit: Payer: Self-pay | Admitting: *Deleted

## 2021-03-31 ENCOUNTER — Telehealth: Payer: Self-pay | Admitting: Radiation Oncology

## 2021-03-31 ENCOUNTER — Inpatient Hospital Stay: Payer: BC Managed Care – PPO

## 2021-03-31 DIAGNOSIS — C50911 Malignant neoplasm of unspecified site of right female breast: Secondary | ICD-10-CM

## 2021-03-31 DIAGNOSIS — Z720 Tobacco use: Secondary | ICD-10-CM | POA: Diagnosis not present

## 2021-03-31 DIAGNOSIS — Z79899 Other long term (current) drug therapy: Secondary | ICD-10-CM | POA: Diagnosis not present

## 2021-03-31 DIAGNOSIS — K625 Hemorrhage of anus and rectum: Secondary | ICD-10-CM | POA: Diagnosis not present

## 2021-03-31 DIAGNOSIS — R928 Other abnormal and inconclusive findings on diagnostic imaging of breast: Secondary | ICD-10-CM

## 2021-03-31 DIAGNOSIS — Z5111 Encounter for antineoplastic chemotherapy: Secondary | ICD-10-CM | POA: Diagnosis not present

## 2021-03-31 DIAGNOSIS — N6312 Unspecified lump in the right breast, upper inner quadrant: Secondary | ICD-10-CM | POA: Diagnosis not present

## 2021-03-31 DIAGNOSIS — C210 Malignant neoplasm of anus, unspecified: Secondary | ICD-10-CM

## 2021-03-31 DIAGNOSIS — L988 Other specified disorders of the skin and subcutaneous tissue: Secondary | ICD-10-CM | POA: Diagnosis not present

## 2021-03-31 DIAGNOSIS — C211 Malignant neoplasm of anal canal: Secondary | ICD-10-CM | POA: Diagnosis not present

## 2021-03-31 DIAGNOSIS — L309 Dermatitis, unspecified: Secondary | ICD-10-CM | POA: Diagnosis not present

## 2021-03-31 DIAGNOSIS — D696 Thrombocytopenia, unspecified: Secondary | ICD-10-CM | POA: Diagnosis not present

## 2021-03-31 DIAGNOSIS — L539 Erythematous condition, unspecified: Secondary | ICD-10-CM | POA: Diagnosis not present

## 2021-03-31 DIAGNOSIS — Z7289 Other problems related to lifestyle: Secondary | ICD-10-CM | POA: Diagnosis not present

## 2021-03-31 LAB — CBC WITH DIFFERENTIAL (CANCER CENTER ONLY)
Abs Immature Granulocytes: 0.02 10*3/uL (ref 0.00–0.07)
Basophils Absolute: 0 10*3/uL (ref 0.0–0.1)
Basophils Relative: 0 %
Eosinophils Absolute: 0.1 10*3/uL (ref 0.0–0.5)
Eosinophils Relative: 3 %
HCT: 39.4 % (ref 36.0–46.0)
Hemoglobin: 13.1 g/dL (ref 12.0–15.0)
Immature Granulocytes: 1 %
Lymphocytes Relative: 25 %
Lymphs Abs: 0.7 10*3/uL (ref 0.7–4.0)
MCH: 32.9 pg (ref 26.0–34.0)
MCHC: 33.2 g/dL (ref 30.0–36.0)
MCV: 99 fL (ref 80.0–100.0)
Monocytes Absolute: 0.2 10*3/uL (ref 0.1–1.0)
Monocytes Relative: 6 %
Neutro Abs: 1.8 10*3/uL (ref 1.7–7.7)
Neutrophils Relative %: 65 %
Platelet Count: 111 10*3/uL — ABNORMAL LOW (ref 150–400)
RBC: 3.98 MIL/uL (ref 3.87–5.11)
RDW: 11.9 % (ref 11.5–15.5)
WBC Count: 2.7 10*3/uL — ABNORMAL LOW (ref 4.0–10.5)
nRBC: 0 % (ref 0.0–0.2)

## 2021-03-31 IMAGING — MR MR BREAST BILAT WO/W CM
8 of 11 series · 29 of 48 positions shown · IV contrast (7ml GADAVIST)
Comparison: PET-CT [DATE]. Diagnostic mammogram and
ultrasound [DATE].

CLINICAL DATA: During recent staging for newly diagnosed anal
carcinoma, a small focus uptake was seen in the upper inner
posterior right breast on PET-CT dated [DATE]. Mammogram and
ultrasound were negative.

LABS:  None
EXAM:
BILATERAL BREAST MRI WITH AND WITHOUT CONTRAST
TECHNIQUE: Multiplanar, multisequence MR images of both breasts were obtained
prior to and following the intravenous administration of 7 ml of
Gadavist

[Series 2: T2 · axial · 3.0mm · 0.81mm/px · z∈[-81,+96]mm · 4 of 60 slices shown]
[im 1/60]
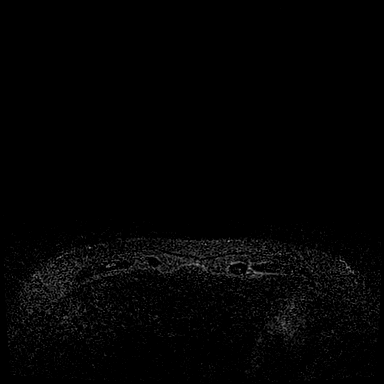
[im 20/60]
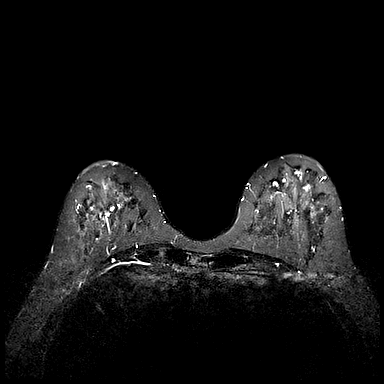
[im 40/60]
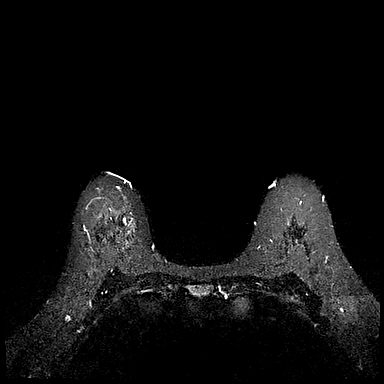
[im 60/60]
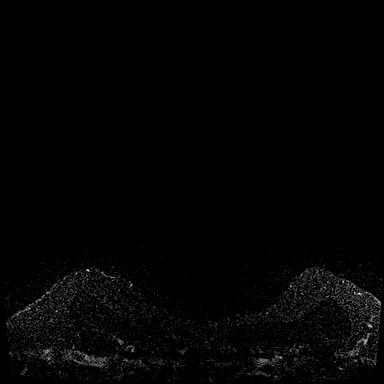

[Series 3: T1 fat-sat · axial · 1.2mm · 0.69mm/px · z∈[-88,+103]mm · 7 of 160 slices shown (1 of 4)]
[im 1/160]
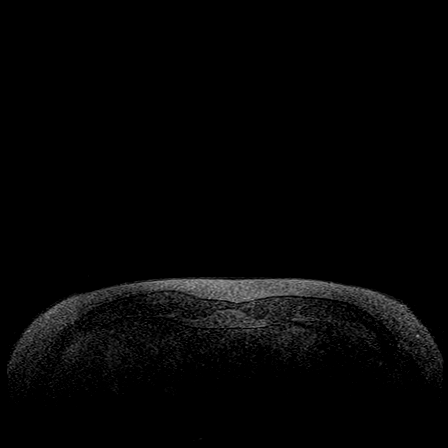
[im 27/160]
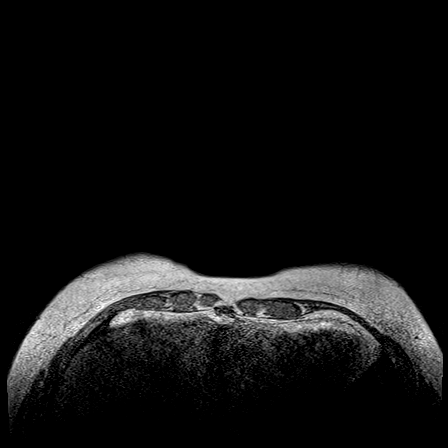
[im 54/160]
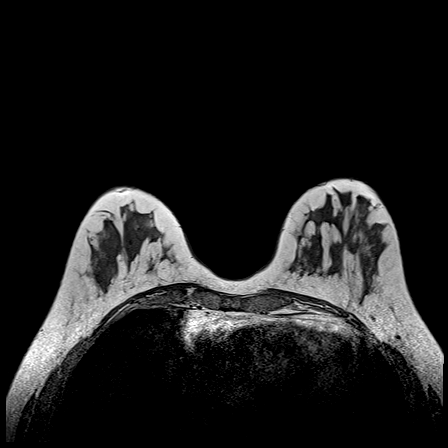
[im 80/160]
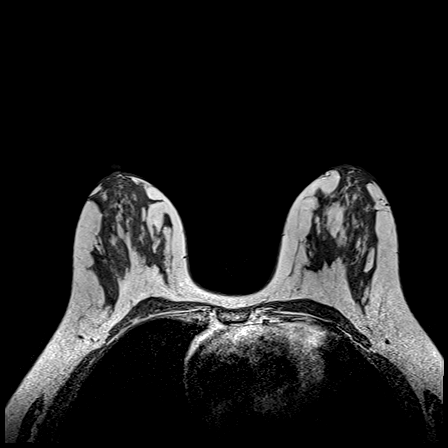
[im 107/160]
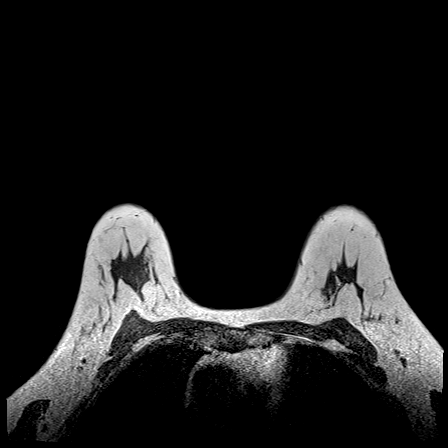
[im 133/160]
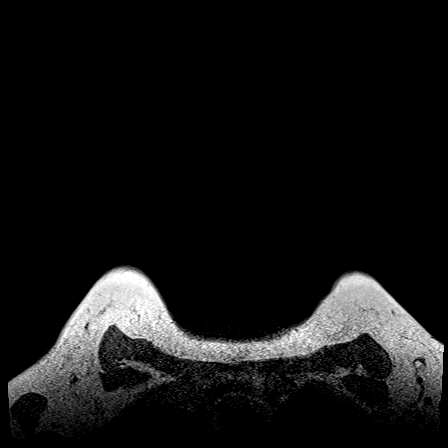
[im 160/160]
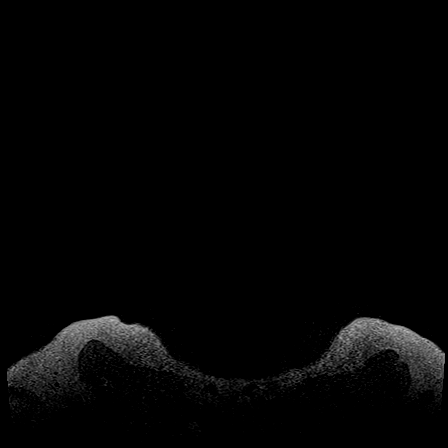

[Series 5: T1 fat-sat · axial · 1.6mm · 0.75mm/px · z∈[-94,+109]mm · 5 of 128 slices shown (2 of 4)]
[im 1/128]
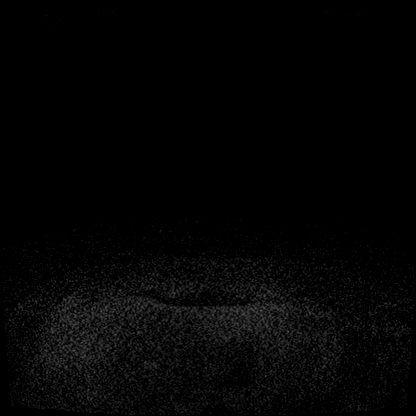
[im 32/128]
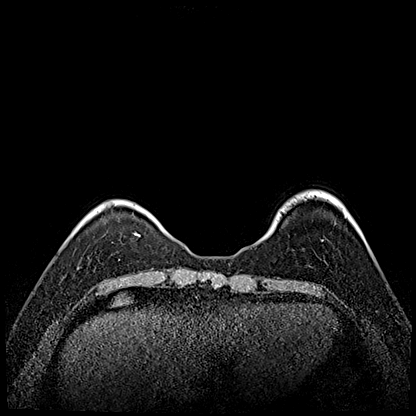
[im 64/128]
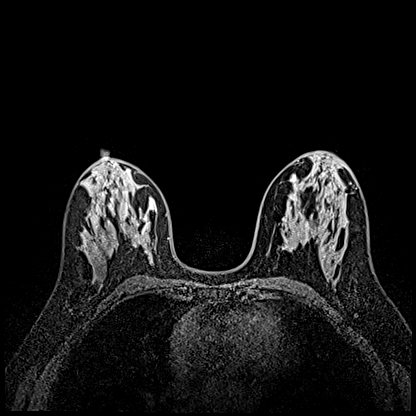
[im 96/128]
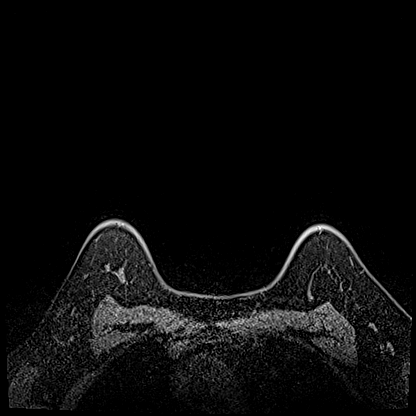
[im 128/128]
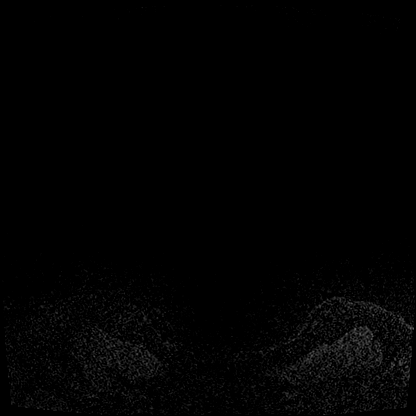

[Series 6: T1 fat-sat · axial · 1.6mm · 0.75mm/px · z∈[-94,+109]mm · 5 of 128 slices shown (3 of 4)]
[im 1/128]
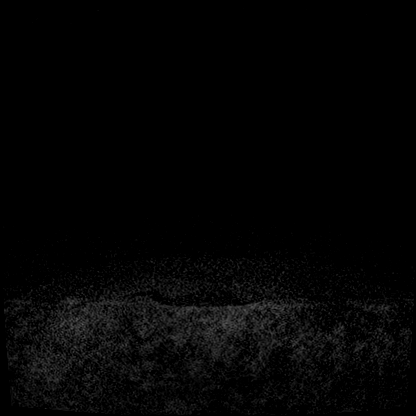
[im 32/128]
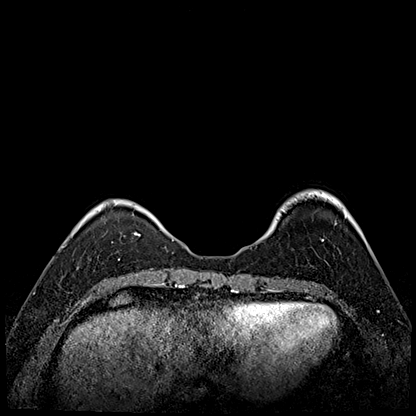
[im 64/128]
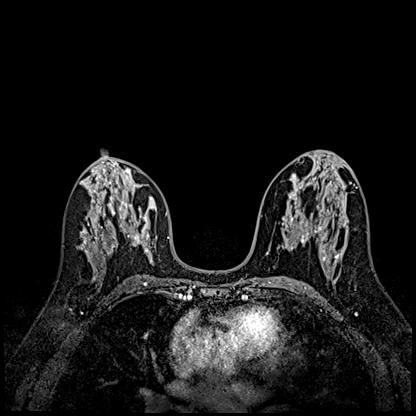
[im 96/128]
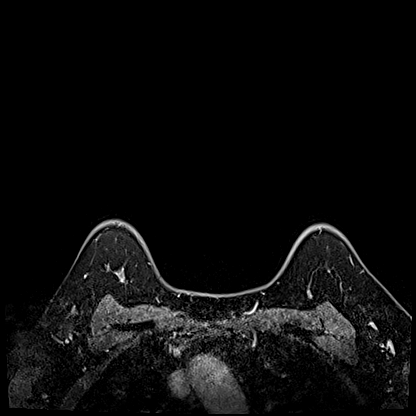
[im 128/128]
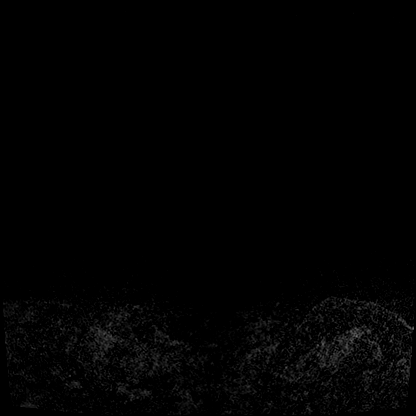

[Series 7: T1 · axial · 1.6mm · 0.75mm/px · z∈[-94,+109]mm · 5 of 128 slices shown (1 of 3)]
[im 1/128]
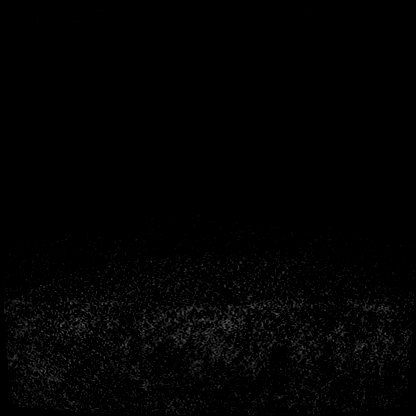
[im 32/128]
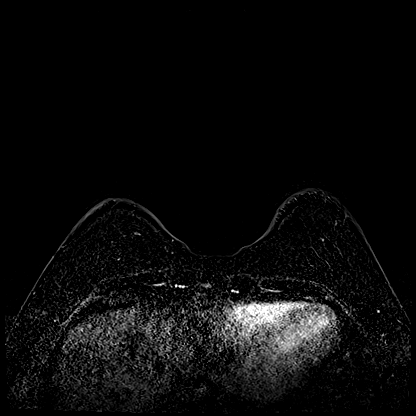
[im 64/128]
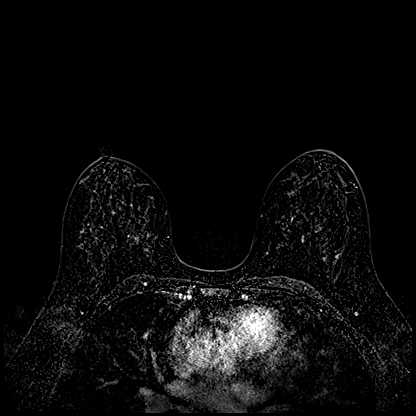
[im 96/128]
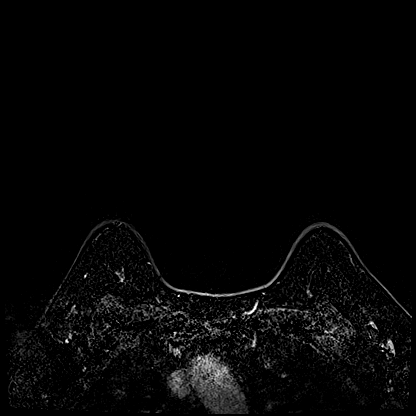
[im 128/128]
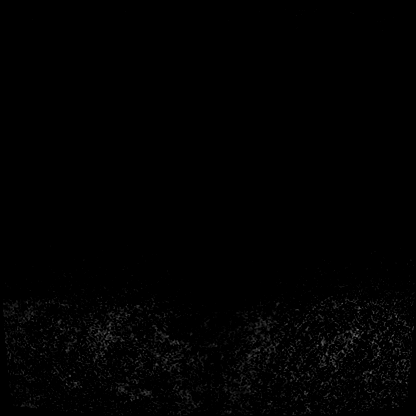

[Series 8: T1 · coronal · 310.0mm · 0.75mm/px · 1 of 3 slices shown (2 of 3)]
[im 1/3]
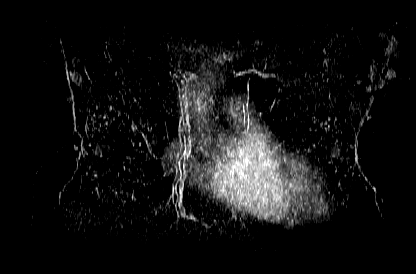

[Series 9: T1 · axial · 204.8mm · 0.75mm/px · 1 of 3 slices shown (3 of 3)]
[im 1/3]
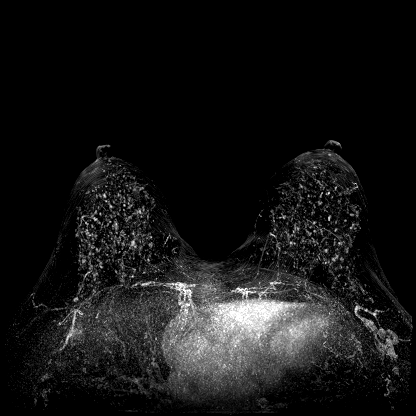

[Series 10: T1 fat-sat · axial · 1.6mm · 0.75mm/px · 1 of 128 slices shown (4 of 4)]
[im 1/128]
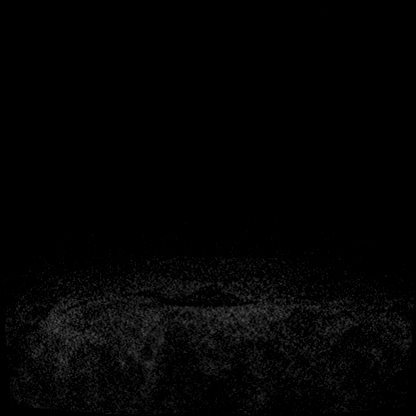

[29 of 48 positions shown; findings below may reference images not displayed]

Three-dimensional MR images were rendered by post-processing of the
original MR data on an independent workstation. The
three-dimensional MR images were interpreted, and findings are
reported in the following complete MRI report for this study. Three
dimensional images were evaluated at the independent interpreting
workstation using the DynaCAD thin client.
FINDINGS: Breast composition: c. Heterogeneous fibroglandular tissue.

Background parenchymal enhancement: Moderate.

Right breast: There is a 7 mm mass in the upper inner posterior
right breast on series 6, image 60 with plateau kinetics, favored to
correlate with the recent PET CT findings. No other suspicious
findings.

Left breast: No mass or abnormal enhancement.

Lymph nodes: No abnormal appearing lymph nodes.

Ancillary findings:  None.
IMPRESSION: 1. There is a 7 mm mass in the upper inner posterior right breast
with plateau kinetics on series 6, image 60, likely correlating with
the recent PET-CT findings.
2. No other abnormalities identified in either breast. No adenopathy
identified.

RECOMMENDATION:
Recommend MRI guided biopsy of the mass in the upper inner posterior
right breast on series 6, image 60.

BI-RADS CATEGORY  4: Suspicious.

## 2021-03-31 MED ORDER — GADOBUTROL 1 MMOL/ML IV SOLN
7.0000 mL | Freq: Once | INTRAVENOUS | Status: AC | PRN
  Administered 2021-03-31: 7 mL via INTRAVENOUS

## 2021-03-31 NOTE — Telephone Encounter (Signed)
I called the patient to let her know her MRI results and that there is a lesion in the right breast that should be biopsied. She is in agreement and Bary Castilla has graciously offered to coordinate this for her. She will be in touch with the patient soon. We also discussed skin care regarding her rash from what's likely 5FU and her skin in the anal area.

## 2021-04-01 ENCOUNTER — Other Ambulatory Visit: Payer: Self-pay | Admitting: Radiation Oncology

## 2021-04-01 ENCOUNTER — Ambulatory Visit
Admission: RE | Admit: 2021-04-01 | Discharge: 2021-04-01 | Disposition: A | Discharge status: Home / Self Care | Payer: BC Managed Care – PPO | Source: Ambulatory Visit | Attending: Radiation Oncology | Admitting: Radiation Oncology

## 2021-04-01 ENCOUNTER — Other Ambulatory Visit: Payer: Self-pay

## 2021-04-01 DIAGNOSIS — C211 Malignant neoplasm of anal canal: Secondary | ICD-10-CM | POA: Diagnosis not present

## 2021-04-01 DIAGNOSIS — R928 Other abnormal and inconclusive findings on diagnostic imaging of breast: Secondary | ICD-10-CM

## 2021-04-04 ENCOUNTER — Inpatient Hospital Stay (HOSPITAL_BASED_OUTPATIENT_CLINIC_OR_DEPARTMENT_OTHER): Payer: BC Managed Care – PPO | Admitting: Oncology

## 2021-04-04 ENCOUNTER — Telehealth: Payer: Self-pay | Admitting: Oncology

## 2021-04-04 ENCOUNTER — Other Ambulatory Visit: Payer: Self-pay

## 2021-04-04 ENCOUNTER — Telehealth: Payer: Self-pay | Admitting: Radiation Oncology

## 2021-04-04 ENCOUNTER — Ambulatory Visit
Admission: RE | Admit: 2021-04-04 | Discharge: 2021-04-04 | Disposition: A | Discharge status: Home / Self Care | Payer: BC Managed Care – PPO | Source: Ambulatory Visit | Attending: Radiation Oncology | Admitting: Radiation Oncology

## 2021-04-04 ENCOUNTER — Inpatient Hospital Stay: Payer: BC Managed Care – PPO

## 2021-04-04 VITALS — BP 112/72 | HR 69 | Temp 98.1°F | Resp 20 | Ht 67.0 in | Wt 159.0 lb

## 2021-04-04 DIAGNOSIS — Z79899 Other long term (current) drug therapy: Secondary | ICD-10-CM | POA: Diagnosis not present

## 2021-04-04 DIAGNOSIS — C210 Malignant neoplasm of anus, unspecified: Secondary | ICD-10-CM

## 2021-04-04 DIAGNOSIS — L309 Dermatitis, unspecified: Secondary | ICD-10-CM | POA: Diagnosis not present

## 2021-04-04 DIAGNOSIS — L539 Erythematous condition, unspecified: Secondary | ICD-10-CM | POA: Diagnosis not present

## 2021-04-04 DIAGNOSIS — L988 Other specified disorders of the skin and subcutaneous tissue: Secondary | ICD-10-CM | POA: Diagnosis not present

## 2021-04-04 DIAGNOSIS — C211 Malignant neoplasm of anal canal: Secondary | ICD-10-CM | POA: Diagnosis not present

## 2021-04-04 DIAGNOSIS — Z7289 Other problems related to lifestyle: Secondary | ICD-10-CM | POA: Diagnosis not present

## 2021-04-04 DIAGNOSIS — Z5111 Encounter for antineoplastic chemotherapy: Secondary | ICD-10-CM | POA: Diagnosis not present

## 2021-04-04 DIAGNOSIS — Z720 Tobacco use: Secondary | ICD-10-CM | POA: Diagnosis not present

## 2021-04-04 DIAGNOSIS — D696 Thrombocytopenia, unspecified: Secondary | ICD-10-CM | POA: Diagnosis not present

## 2021-04-04 DIAGNOSIS — K625 Hemorrhage of anus and rectum: Secondary | ICD-10-CM | POA: Diagnosis not present

## 2021-04-04 DIAGNOSIS — N6312 Unspecified lump in the right breast, upper inner quadrant: Secondary | ICD-10-CM | POA: Diagnosis not present

## 2021-04-04 LAB — CBC WITH DIFFERENTIAL (CANCER CENTER ONLY)
Abs Immature Granulocytes: 0.02 10*3/uL (ref 0.00–0.07)
Basophils Absolute: 0 10*3/uL (ref 0.0–0.1)
Basophils Relative: 1 %
Eosinophils Absolute: 0.1 10*3/uL (ref 0.0–0.5)
Eosinophils Relative: 3 %
HCT: 38.7 % (ref 36.0–46.0)
Hemoglobin: 13.2 g/dL (ref 12.0–15.0)
Immature Granulocytes: 1 %
Lymphocytes Relative: 21 %
Lymphs Abs: 0.8 10*3/uL (ref 0.7–4.0)
MCH: 33.2 pg (ref 26.0–34.0)
MCHC: 34.1 g/dL (ref 30.0–36.0)
MCV: 97.2 fL (ref 80.0–100.0)
Monocytes Absolute: 0.3 10*3/uL (ref 0.1–1.0)
Monocytes Relative: 9 %
Neutro Abs: 2.4 10*3/uL (ref 1.7–7.7)
Neutrophils Relative %: 65 %
Platelet Count: 81 10*3/uL — ABNORMAL LOW (ref 150–400)
RBC: 3.98 MIL/uL (ref 3.87–5.11)
RDW: 12.2 % (ref 11.5–15.5)
WBC Count: 3.7 10*3/uL — ABNORMAL LOW (ref 4.0–10.5)
nRBC: 0 % (ref 0.0–0.2)

## 2021-04-04 LAB — CMP (CANCER CENTER ONLY)
ALT: 67 U/L — ABNORMAL HIGH (ref 0–44)
AST: 29 U/L (ref 15–41)
Albumin: 4 g/dL (ref 3.5–5.0)
Alkaline Phosphatase: 48 U/L (ref 38–126)
Anion gap: 6 (ref 5–15)
BUN: 10 mg/dL (ref 6–20)
CO2: 26 mmol/L (ref 22–32)
Calcium: 9.1 mg/dL (ref 8.9–10.3)
Chloride: 107 mmol/L (ref 98–111)
Creatinine: 0.61 mg/dL (ref 0.44–1.00)
GFR, Estimated: >60 mL/min (ref >60)
Glucose, Bld: 144 mg/dL — ABNORMAL HIGH (ref 70–99)
Potassium: 3.3 mmol/L — ABNORMAL LOW (ref 3.5–5.1)
Sodium: 139 mmol/L (ref 135–145)
Total Bilirubin: 0.5 mg/dL (ref 0.3–1.2)
Total Protein: 6.4 g/dL — ABNORMAL LOW (ref 6.5–8.1)

## 2021-04-04 MED ORDER — OXYCODONE HCL 5 MG PO TABS: 5.0000 mg | ORAL_TABLET | Freq: Four times a day (QID) | ORAL | 0 refills | Status: DC | PRN

## 2021-04-04 NOTE — Telephone Encounter (Signed)
I called the patient after the staff told me she had questions. When I spoke with her we discussed her skin care at this time and that Dr. Benay Spice had thought she had some blisters starting to appear from radiation. She was counseled on keeping the skin dry without cream. She will use dermoplast prn which she's found to be helpful, and plain tepid water sitz baths. We will follow her closely.

## 2021-04-04 NOTE — Progress Notes (Signed)
[place] OFFICE PROGRESS NOTE   Diagnosis: Anal cancer  INTERVAL HISTORY:   Beth [name] completed cycle one 5-FU/Mitomycin-C beginning 03/18/2021.  She reports nausea over the week following chemotherapy.  She continues to have nausea.  No emesis.  The nausea is relieved with antiemetics.  She developed mouth sores following chemotherapy.  This was relieved with Magic mouthwash.  She has discomfort at the perineum when wiping.  She is not using a barrier cream.  No burning with urination.  She continues to have bleeding and pain at the anus.  She is taking oxycodone twice daily.  She developed erythema surrounding the PICC site.  She also had erythema at the lower arm and dorsum of the hands bilaterally.  The skin changes have improved.  A mammogram and ultrasound on 03/28/2021 revealed no lesion in the right breast to correlate with the PET finding.  She was referred for a breast MRI on 03/31/2021.  A 7 mm mass was noted in the upper inner right breast.  No other suspicious finding.  No abnormal appearing lymph nodes. She is scheduled for a right breast biopsy on 04/11/2021.  Objective:  Vital signs in last 24 hours:  Blood pressure 112/72, pulse 69, temperature 98.1 F (36.7 C), temperature source Tympanic, resp. rate 20, height 5\' 7"  (1.702 m), weight 159 lb (72.1 kg), last menstrual period 03/13/2012, SpO2 100 %.    HEENT: No thrush or ulcers Resp: Scattered mild inspiratory wheeze.  Coarse inspiratory rhonchi at the posterior bases, no respiratory distress Cardio: Regular rate and rhythm GI: No hepatosplenomegaly Vascular: No leg edema  Skin: Erythema with superficial skin breakdown at the perineum, no breakdown or erythema at the external labia.  Area of hyperpigmentation at the left upper arm in a tape distribution.  Mild hyperpigmentation at the dorsum of the hands and lower arm bilaterally.    Lab Results:  Lab Results  Component Value Date   WBC 3.7 (L)  04/04/2021   HGB 13.2 04/04/2021   HCT 38.7 04/04/2021   MCV 97.2 04/04/2021   PLT 81 (L) 04/04/2021   NEUTROABS 2.4 04/04/2021    CMP  Lab Results  Component Value Date   NA 139 04/04/2021   K 3.3 (L) 04/04/2021   CL 107 04/04/2021   CO2 26 04/04/2021   GLUCOSE 144 (H) 04/04/2021   BUN 10 04/04/2021   CREATININE 0.61 04/04/2021   CALCIUM 9.1 04/04/2021   PROT 6.4 (L) 04/04/2021   ALBUMIN 4.0 04/04/2021   AST 29 04/04/2021   ALT 67 (H) 04/04/2021   ALKPHOS 48 04/04/2021   BILITOT 0.5 04/04/2021   GFRNONAA >60 04/04/2021   GFRAA 106 11/29/2020    No results found for: CEA1  No results found for: INR  Imaging:  MR BREAST BILATERAL W WO CONTRAST INC CAD  Result Date: 03/31/2021 CLINICAL DATA:  During recent staging for newly diagnosed anal carcinoma, a small focus uptake was seen in the upper inner posterior right breast on PET-CT dated March 16, 2021. Mammogram and ultrasound were negative. LABS:  None EXAM: BILATERAL BREAST MRI WITH AND WITHOUT CONTRAST TECHNIQUE: Multiplanar, multisequence MR images of both breasts were obtained prior to and following the intravenous administration of 7 ml of Gadavist Three-dimensional MR images were rendered by post-processing of the original MR data on an independent workstation. The three-dimensional MR images were interpreted, and findings are reported in the following complete MRI report for this study. Three dimensional images were evaluated at the independent  interpreting workstation using the DynaCAD thin client. COMPARISON:  PET-CT March 16, 2021. Diagnostic mammogram and ultrasound March 28, 2021. FINDINGS: Breast composition: c. Heterogeneous fibroglandular tissue. Background parenchymal enhancement: Moderate. Right breast: There is a 7 mm mass in the upper inner posterior right breast on series 6, image 60 with plateau kinetics, favored to correlate with the recent PET CT findings. No other suspicious findings. Left breast: No mass or  abnormal enhancement. Lymph nodes: No abnormal appearing lymph nodes. Ancillary findings:  None. IMPRESSION: 1. There is a 7 mm mass in the upper inner posterior right breast with plateau kinetics on series 6, image 60, likely correlating with the recent PET-CT findings. 2. No other abnormalities identified in either breast. No adenopathy identified. RECOMMENDATION: Recommend MRI guided biopsy of the mass in the upper inner posterior right breast on series 6, image 60. BI-RADS CATEGORY  4: Suspicious. Electronically Signed   By: Dorise Bullion III M.D   On: 03/31/2021 16:00    Medications: I have reviewed the patient's current medications.   Assessment/Plan: 1. Squamous cell carcinoma of the anal canal ? Colonoscopy 01/28/2021-rectal mass extending to the "outside ", palpable on exam-biopsy revealed high-grade squamous intraepithelial lesion (AIN 3/carcinoma in situ) ? 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 CTs 03/07/2021-ill-definition of tissue planes along the anus with abnormal soft tissue prominence posteriorly along the anal canal suspicious for mass.  Small adjacent lower perirectal lymph nodes in addition to a 0.5 cm sacral node.  Focal enhancing lesion in the right hepatic lobe 1.2 x 0.8 x 0.9 cm.  2.1 x 1.5 cm cystic lesion right ovary with thin enhancing rim. ? PET scan 03/16/2021-hypermetabolic mass within the anal canal.  No definite evidence of local or distant metastatic disease.  7 mm left submandibular lymph node with mild FDG uptake, likely reactive.  Focal site of FDG uptake  within the inner quadrant of the right breast. ? Radiation 03/21/2021 ? Cycle one 5-FU/mitomycin-C 03/21/2021 2. Remote history of an anal fissure repair 3. Tobacco use 4. Moderate alcohol use 5. Eczema 6. Right breast mass- PET scan 03/16/2021 with focal FDG activity in the inner right breast  Mammogram and right breast ultrasound 03/28/2021- negative  Bilateral breast MRI 03/31/2021- 7 mm mass in the upper inner right breast correlating with the PET findings, no other abnormality, no adenopathy     Disposition: Ms. Belfiore is now at day 15 following cycle one 5-FU/Mitomycin-C.  She has mild thrombocytopenia.  The first cycle of chemotherapy was complicated by nausea, mucositis, and a skin rash.  The mouth sores and rash were likely related to 5-fluorouracil.  5-fluorouracil will be dose reduced with cycle 2.  She has developed skin breakdown at the perineum.  I recommended she consult with radiation regarding the use of barrier cream at the perineum.  I refilled her prescription for oxycodone.  She will be scheduled for PICC placement and cycle two 5-FU/Mitomycin-C on 04/18/2021.  She is scheduled for an office visit on 04/18/2021.  Ms. Allender is scheduled for biopsy of the right breast lesion next week.  [name], MD  04/04/2021  10:42 AM

## 2021-04-04 NOTE — Telephone Encounter (Signed)
Appointments scheduled per 4/18 los, Updates given in infusion

## 2021-04-05 ENCOUNTER — Ambulatory Visit
Admission: RE | Admit: 2021-04-05 | Discharge: 2021-04-05 | Disposition: A | Discharge status: Home / Self Care | Payer: BC Managed Care – PPO | Source: Ambulatory Visit | Attending: Radiation Oncology | Admitting: Radiation Oncology

## 2021-04-05 DIAGNOSIS — C211 Malignant neoplasm of anal canal: Secondary | ICD-10-CM | POA: Diagnosis not present

## 2021-04-06 ENCOUNTER — Ambulatory Visit
Admission: RE | Admit: 2021-04-06 | Discharge: 2021-04-06 | Disposition: A | Discharge status: Home / Self Care | Payer: BC Managed Care – PPO | Source: Ambulatory Visit | Attending: Radiation Oncology | Admitting: Radiation Oncology

## 2021-04-06 ENCOUNTER — Other Ambulatory Visit: Payer: Self-pay

## 2021-04-06 DIAGNOSIS — C211 Malignant neoplasm of anal canal: Secondary | ICD-10-CM | POA: Diagnosis not present

## 2021-04-07 ENCOUNTER — Ambulatory Visit
Admission: RE | Admit: 2021-04-07 | Discharge: 2021-04-07 | Disposition: A | Discharge status: Home / Self Care | Payer: BC Managed Care – PPO | Source: Ambulatory Visit | Attending: Radiation Oncology | Admitting: Radiation Oncology

## 2021-04-07 DIAGNOSIS — C211 Malignant neoplasm of anal canal: Secondary | ICD-10-CM | POA: Diagnosis not present

## 2021-04-08 ENCOUNTER — Ambulatory Visit
Admission: RE | Admit: 2021-04-08 | Discharge: 2021-04-08 | Disposition: A | Discharge status: Home / Self Care | Payer: BC Managed Care – PPO | Source: Ambulatory Visit | Attending: Radiation Oncology | Admitting: Radiation Oncology

## 2021-04-08 ENCOUNTER — Other Ambulatory Visit: Payer: Self-pay

## 2021-04-08 DIAGNOSIS — C211 Malignant neoplasm of anal canal: Secondary | ICD-10-CM | POA: Diagnosis not present

## 2021-04-11 ENCOUNTER — Other Ambulatory Visit: Payer: Self-pay | Admitting: General Practice

## 2021-04-11 ENCOUNTER — Ambulatory Visit
Admission: RE | Admit: 2021-04-11 | Discharge: 2021-04-11 | Disposition: A | Discharge status: Home / Self Care | Payer: BC Managed Care – PPO | Source: Ambulatory Visit | Attending: Radiation Oncology | Admitting: Radiation Oncology

## 2021-04-11 ENCOUNTER — Other Ambulatory Visit: Payer: Self-pay

## 2021-04-11 DIAGNOSIS — R928 Other abnormal and inconclusive findings on diagnostic imaging of breast: Secondary | ICD-10-CM

## 2021-04-11 DIAGNOSIS — N6312 Unspecified lump in the right breast, upper inner quadrant: Secondary | ICD-10-CM | POA: Diagnosis not present

## 2021-04-11 DIAGNOSIS — N6011 Diffuse cystic mastopathy of right breast: Secondary | ICD-10-CM | POA: Diagnosis not present

## 2021-04-11 DIAGNOSIS — C211 Malignant neoplasm of anal canal: Secondary | ICD-10-CM | POA: Diagnosis not present

## 2021-04-11 IMAGING — MR MR BREAST BX W/ LOC DEV 1ST LEASION IMAGE BX SPEC MR GUIDE*R*
6 of 8 series · 32 of 48 positions shown · IV contrast (GAD)
Comparison: Previous exams.
COMPARISON: Previous exams.

Addendum:
CLINICAL DATA: 49-year-old female presenting for biopsy of a right
breast mass.

EXAM:
MRI GUIDED CORE NEEDLE BIOPSY OF THE RIGHT BREAST
TECHNIQUE: Multiplanar, multisequence MR imaging of the right breast was
performed both before and after administration of intravenous
contrast.
CONTRAST:  8 ml Gadavist

[Series 8: fiducial unilateral · sagittal · 2.0mm · 1.33mm/px · 1 of 52 slices shown]
[im 1/52]
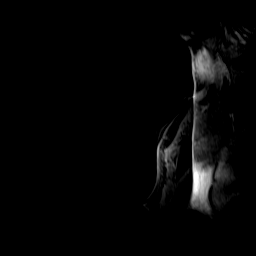

[Series 9: dynamic pre · axial · non-contrast · 1.3mm · 0.73mm/px · z∈[-116,+70]mm · 6 of 144 slices shown]
[im 1/144]
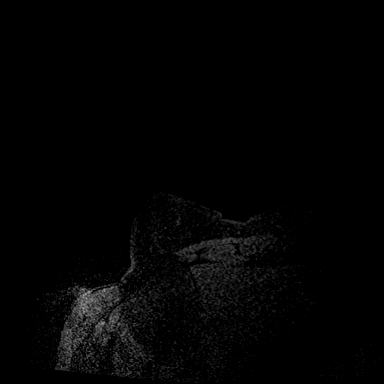
[im 29/144]
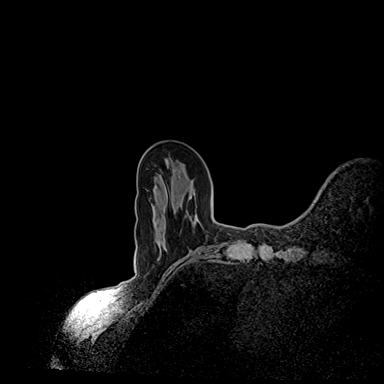
[im 58/144]
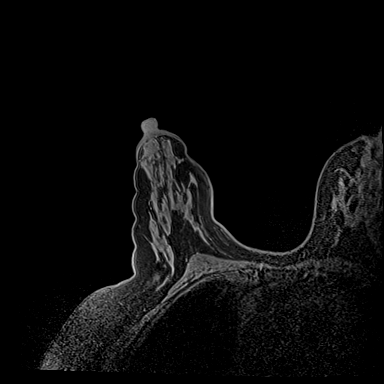
[im 86/144]
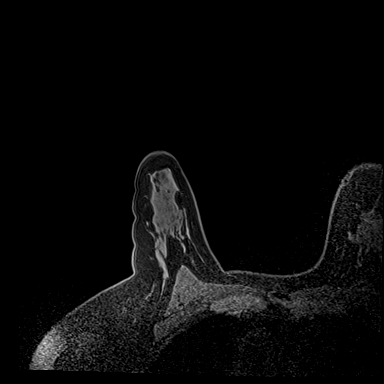
[im 115/144]
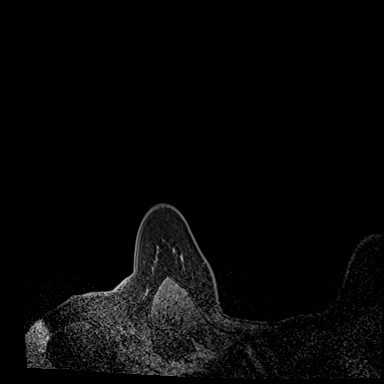
[im 144/144]
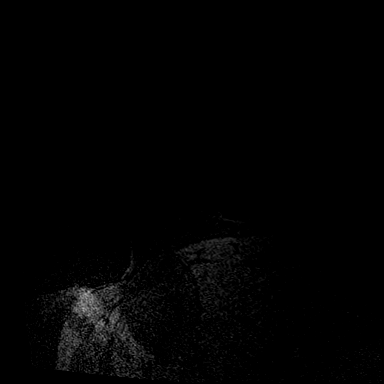

[Series 10: dynamic post 20 · axial · 1.3mm · 0.73mm/px · z∈[-116,+70]mm · 6 of 144 slices shown (1 of 2)]
[im 1/144]
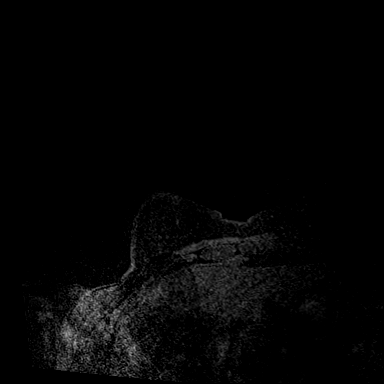
[im 29/144]
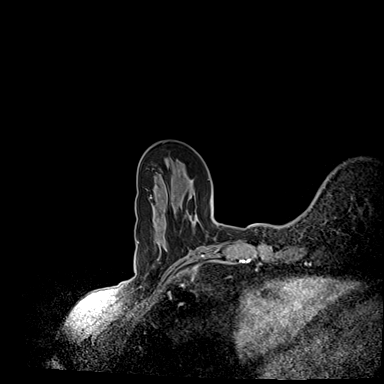
[im 58/144]
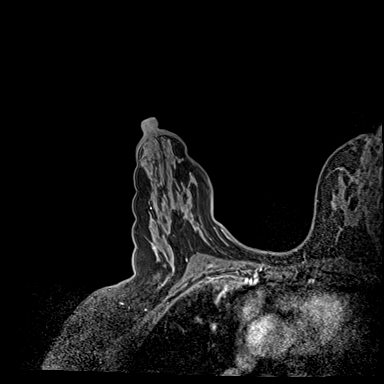
[im 86/144]
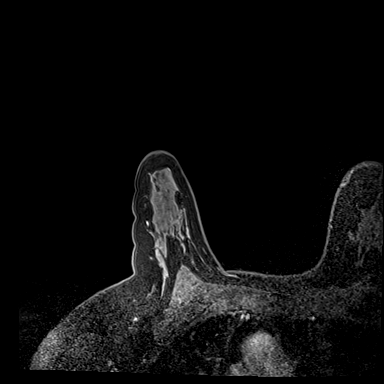
[im 115/144]
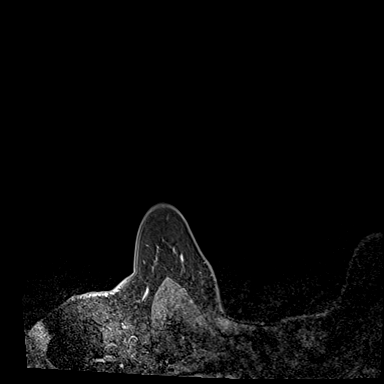
[im 144/144]
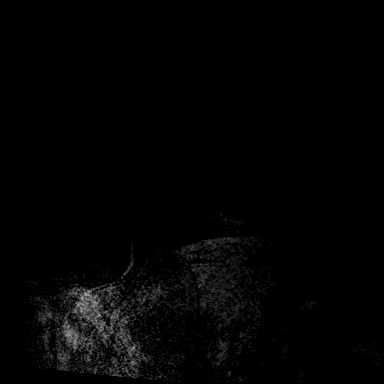

[Series 11: dynamic post 20 · axial · 1.3mm · 0.73mm/px · z∈[-116,+70]mm · 7 of 144 slices shown (2 of 2)]
[im 1/144]
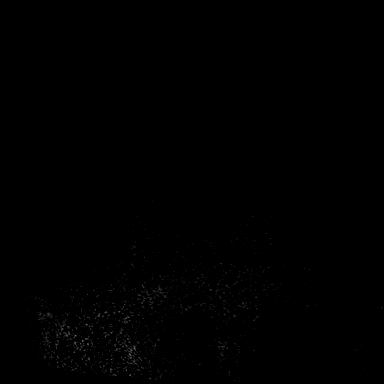
[im 24/144]
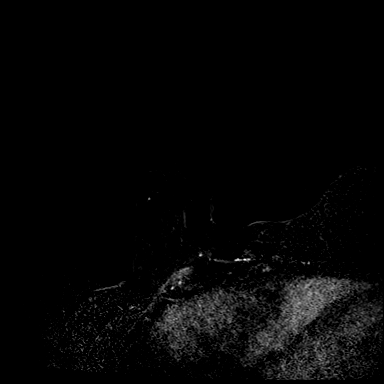
[im 48/144]
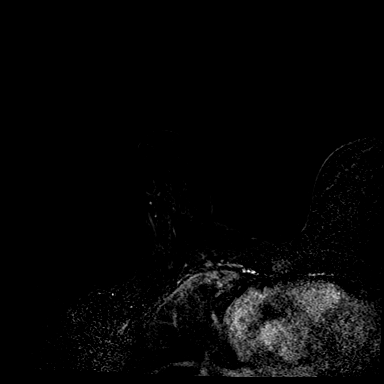
[im 72/144]
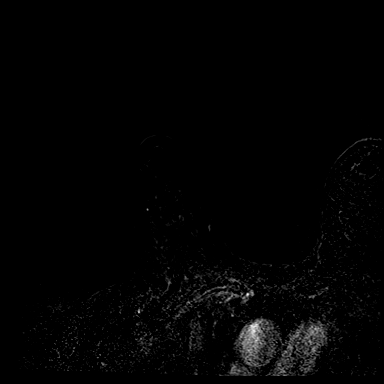
[im 96/144]
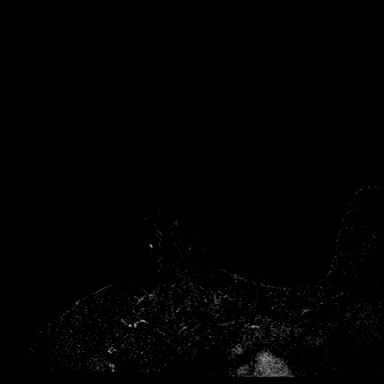
[im 120/144]
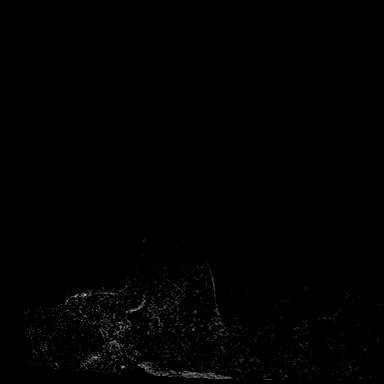
[im 144/144]
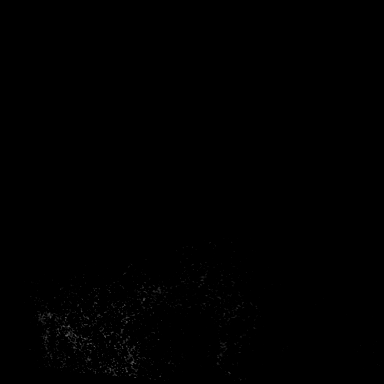

[Series 12: needle confirmation · axial · 1.3mm · 0.73mm/px · z∈[-116,+70]mm · 7 of 144 slices shown]
[im 1/144]
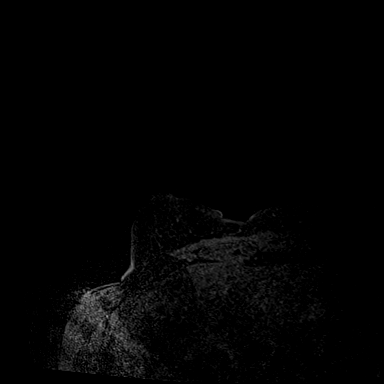
[im 24/144]
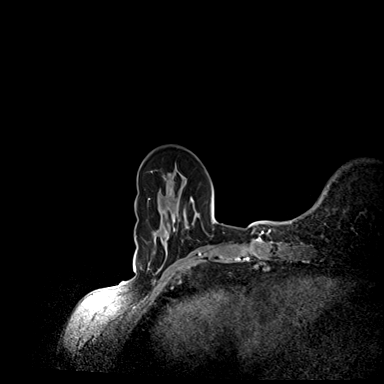
[im 48/144]
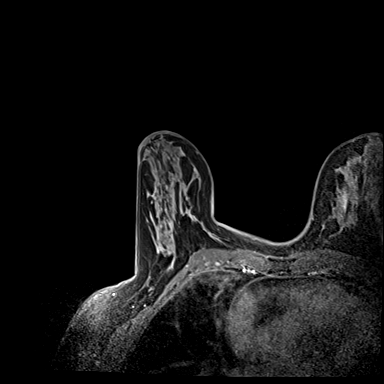
[im 72/144]
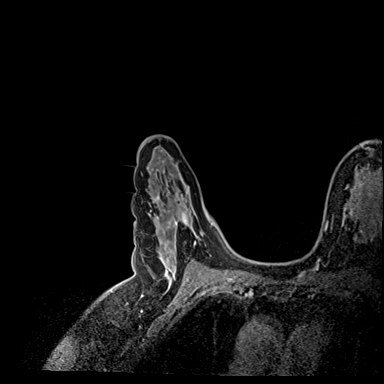
[im 96/144]
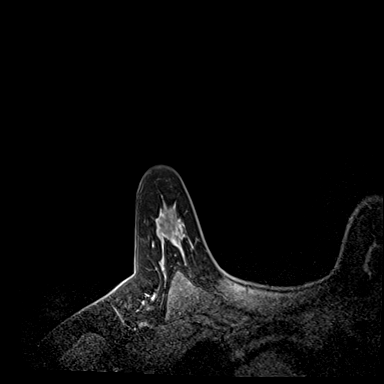
[im 120/144]
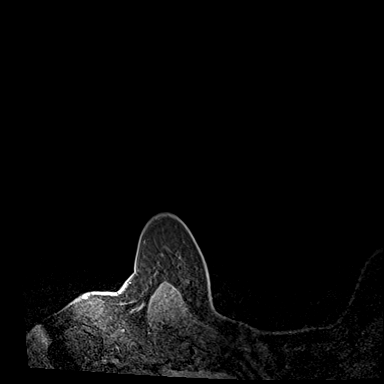
[im 144/144]
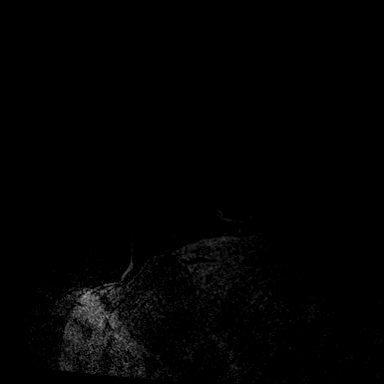

[Series 13: needle confirmation_sub · axial · 1.3mm · 0.73mm/px · z∈[-116,+8]mm · 5 of 144 slices shown]
[im 1/144]
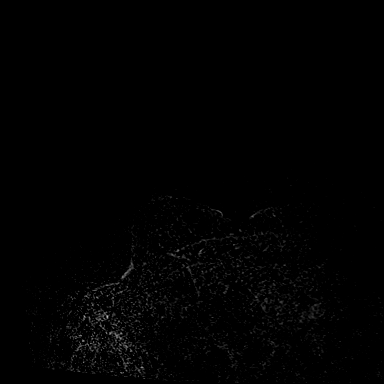
[im 24/144]
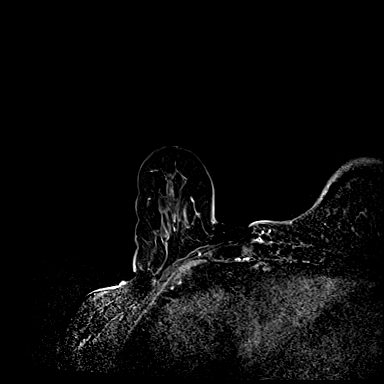
[im 48/144]
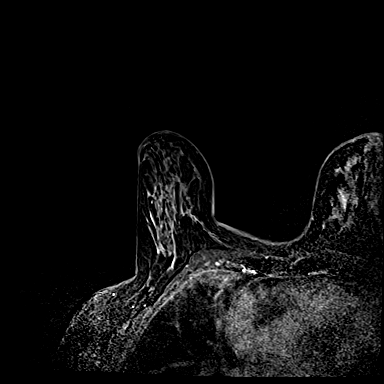
[im 72/144]
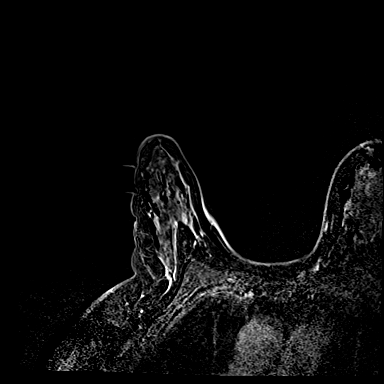
[im 96/144]
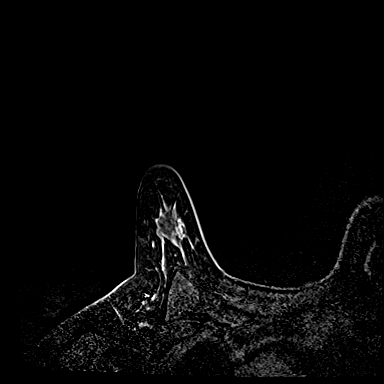

[32 of 48 positions shown; findings below may reference images not displayed]

FINDINGS: I met with the patient, and we discussed the procedure of MRI guided
biopsy, including risks, benefits, and alternatives. Specifically,
we discussed the risks of infection, bleeding, tissue injury, clip
migration, and inadequate sampling. Informed, written consent was
given. The usual time out protocol was performed immediately prior
to the procedure.

Using sterile technique, 1% Lidocaine, MRI guidance, and a 9 gauge
vacuum assisted device, biopsy was performed of a mass in the upper
inner right breast using a lateral approach. At the conclusion of
the procedure, a a barbell tissue marker clip was deployed into the
biopsy cavity. Follow-up 2-view mammogram was performed and dictated
separately.
IMPRESSION: MRI guided biopsy of a mass in the upper inner right breast. No
apparent complications.

ADDENDUM:
Pathology revealed FIBROCYSTIC CHANGES WITH SCLEROSING ADENOSIS,
SMALL FOCUS OF INFLAMMATION AND FIBROSIS SUGGESTIVE OF RESOLVING FAT
NECROSIS, PSEUDOANGIOMATOUS STROMAL HYPERPLASIA (PASH) of the Right
breast, upper inner. This was found to be concordant by Dr. PAULUS N
PAULUS N.

Pathology results were discussed with the patient by telephone. The
patient reported doing well after the biopsy with tenderness at the
site. Post biopsy instructions and care were reviewed and questions
were answered. The patient was encouraged to call The [REDACTED] for any additional concerns. My direct phone
number was provided.

The patient was instructed to return for a bilateral breast MRI in 6
months, per protocol.

PAULUS N, with [HOSPITAL] [REDACTED] was notified of biopsy results and recommendations via [REDACTED]
message on [DATE].

Pathology results reported by PAULUS N, RN on [DATE].

*** End of Addendum ***
FINDINGS: I met with the patient, and we discussed the procedure of MRI guided
biopsy, including risks, benefits, and alternatives. Specifically,
we discussed the risks of infection, bleeding, tissue injury, clip
migration, and inadequate sampling. Informed, written consent was
given. The usual time out protocol was performed immediately prior
to the procedure.

Using sterile technique, 1% Lidocaine, MRI guidance, and a 9 gauge
vacuum assisted device, biopsy was performed of a mass in the upper
inner right breast using a lateral approach. At the conclusion of
the procedure, a a barbell tissue marker clip was deployed into the
biopsy cavity. Follow-up 2-view mammogram was performed and dictated
separately.
IMPRESSION: MRI guided biopsy of a mass in the upper inner right breast. No
apparent complications.

## 2021-04-11 IMAGING — MG MM BREAST LOCALIZATION CLIP
4 series · 4 of 12 positions shown · non-contrast
Comparison: Previous exam(s).

CLINICAL DATA: Post procedure mammogram for clip placement.

EXAM:
DIAGNOSTIC RIGHT MAMMOGRAM POST MRI BIOPSY

[R ML synth-2D]
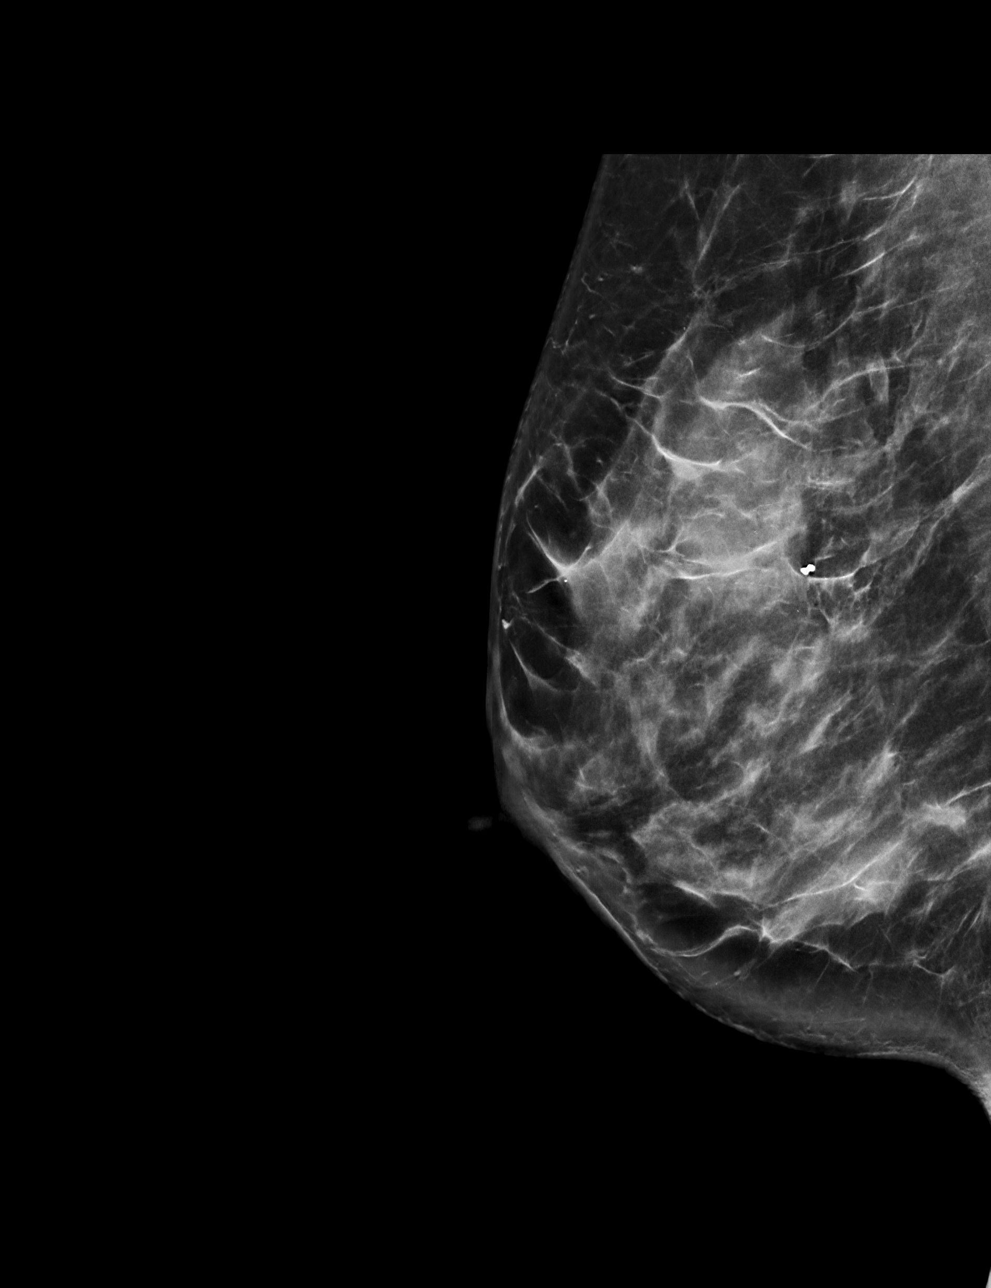

[R CC synth-2D]
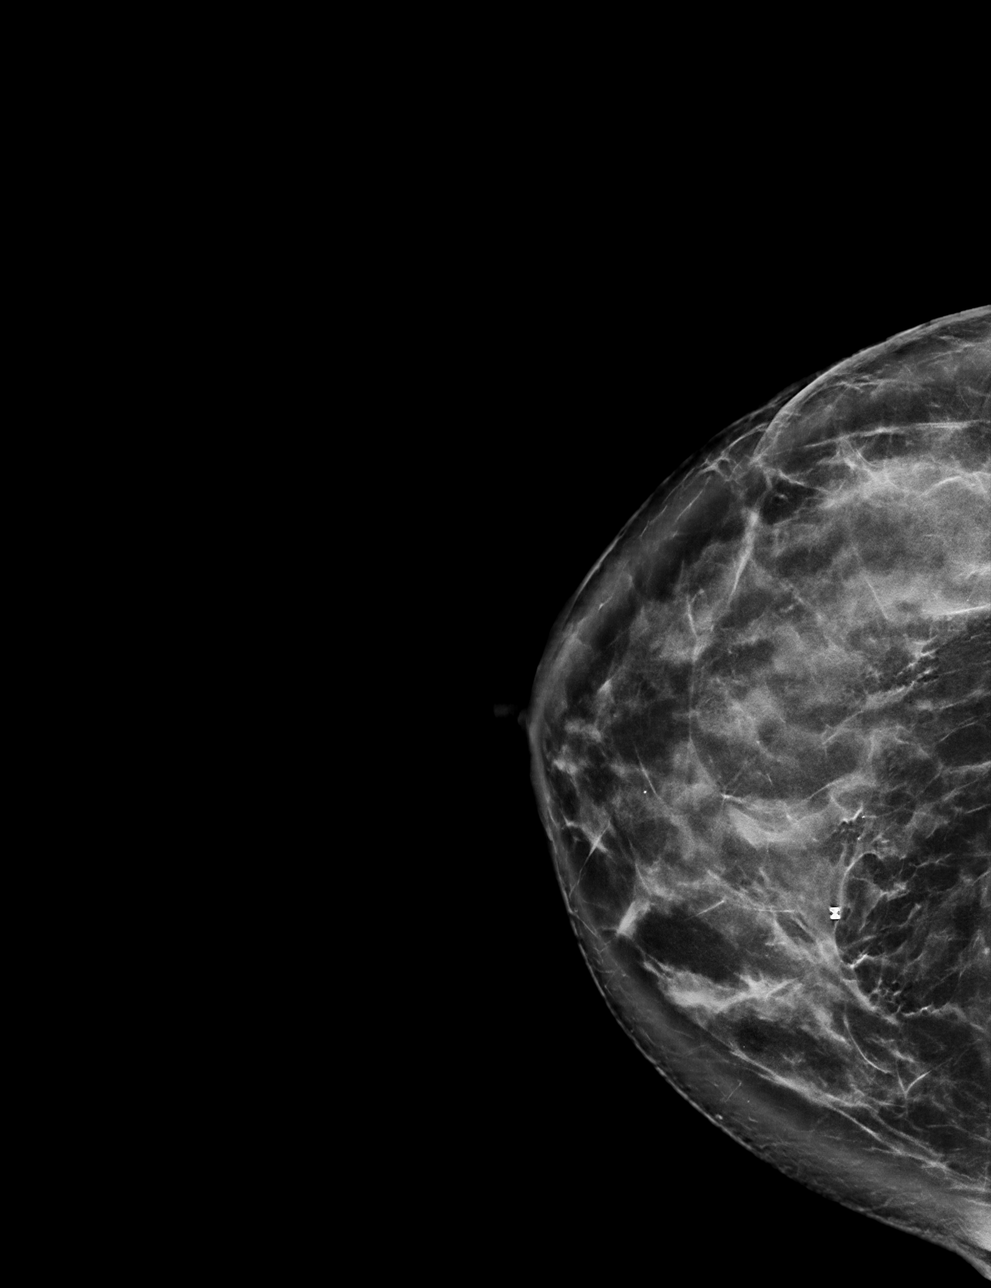

[R ML tomo · tomo slice 39/77.0]
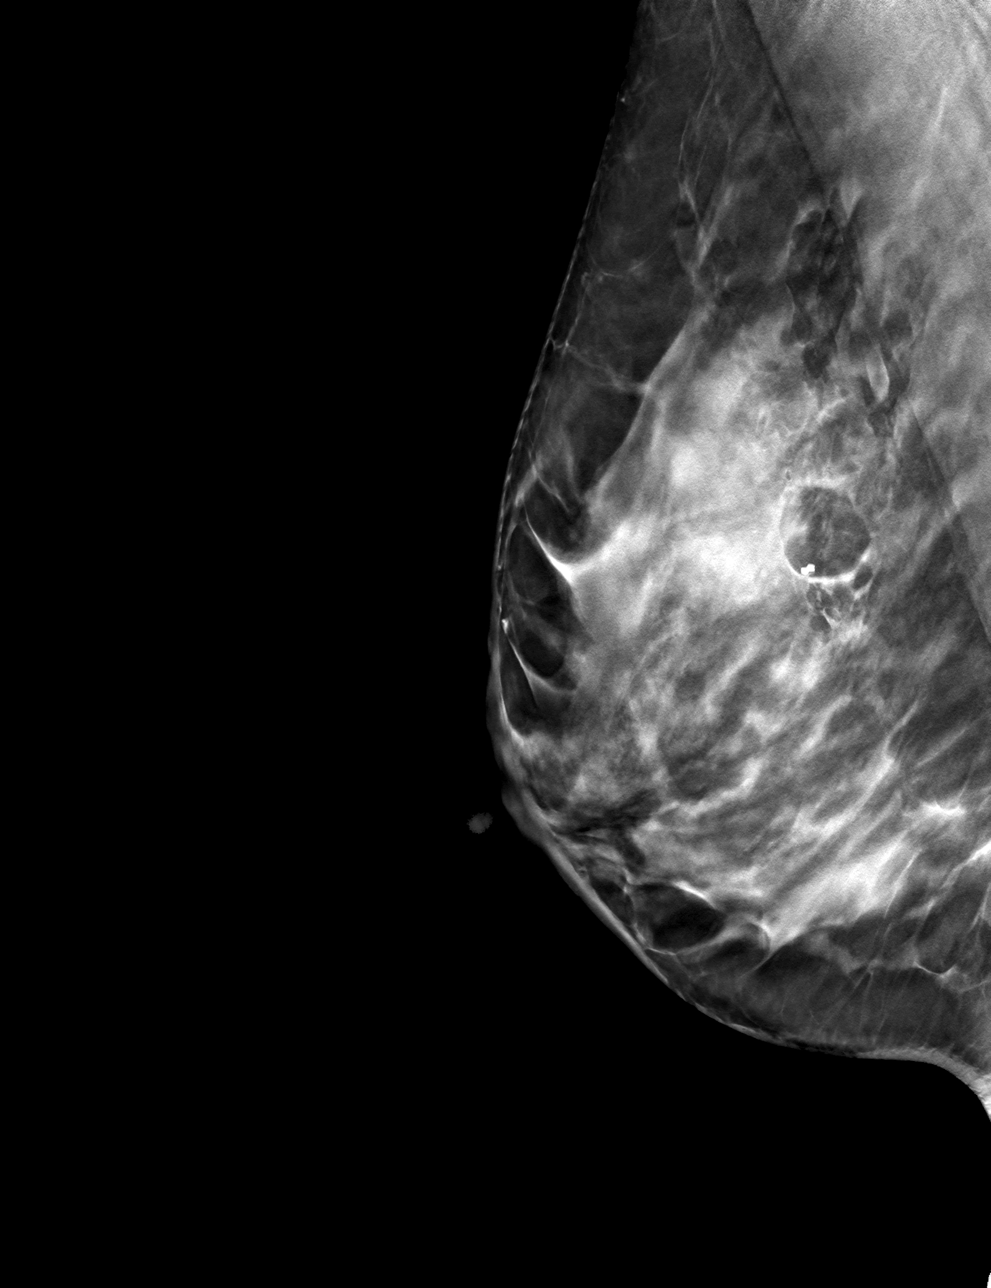

[R CC tomo · tomo slice 45/88.0]
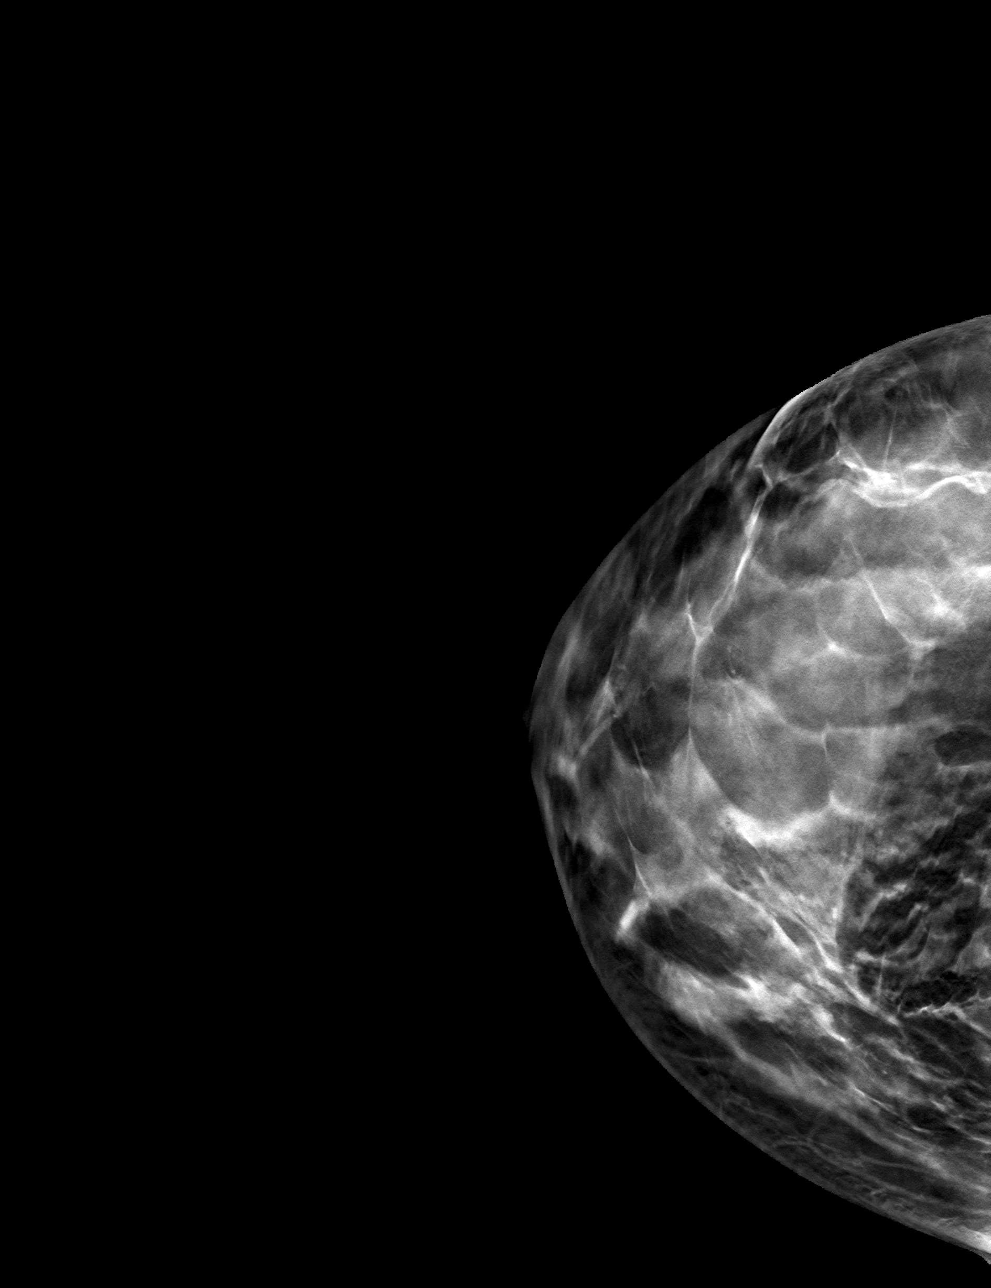

[4 of 12 positions shown; findings below may reference images not displayed]

FINDINGS: Mammographic images were obtained following MRI guided biopsy of a
mass in the upper inner right breast. The biopsy marking clip is in
expected position at the site of biopsy.
IMPRESSION: Appropriate positioning of the barbell shaped biopsy marking clip at
the site of biopsy in the upper inner right breast.

Final Assessment: Post Procedure Mammograms for Marker Placement

## 2021-04-11 MED ORDER — GADOBUTROL 1 MMOL/ML IV SOLN
8.0000 mL | Freq: Once | INTRAVENOUS | Status: AC | PRN
  Administered 2021-04-11: 8 mL via INTRAVENOUS

## 2021-04-12 ENCOUNTER — Telehealth: Payer: Self-pay | Admitting: Radiation Oncology

## 2021-04-12 ENCOUNTER — Telehealth: Payer: Self-pay

## 2021-04-12 ENCOUNTER — Ambulatory Visit
Admission: RE | Admit: 2021-04-12 | Discharge: 2021-04-12 | Disposition: A | Discharge status: Home / Self Care | Payer: BC Managed Care – PPO | Source: Ambulatory Visit | Attending: Radiation Oncology | Admitting: Radiation Oncology

## 2021-04-12 DIAGNOSIS — C210 Malignant neoplasm of anus, unspecified: Secondary | ICD-10-CM

## 2021-04-12 DIAGNOSIS — C211 Malignant neoplasm of anal canal: Secondary | ICD-10-CM | POA: Diagnosis not present

## 2021-04-12 NOTE — Telephone Encounter (Signed)
TC from Pt stating her gums were bleeding when she brushes her teeth. Informed Pt to come in for lab visit tomorrow to check her platelet count. Pt scheduled for lab.

## 2021-04-12 NOTE — Telephone Encounter (Signed)
The patient's biopsy results are back from her right breast, fortunately Dr. Maury Dus felt that these were concordant and she has fibrocystic change with sclerosing adenosis, a small focus of inflammation and fibrosis suggestive of resulting fat necrosis, pseudo angiomatous stromal hyperplasia and no evidence of malignancy.  Recommendations are for her to have a repeat MRI in 6 months time.  I let her know that I would update Dr. Benay Spice and Dr. Lisbeth Renshaw. Dr. Benay Spice will most likely order her MRI scan at that point as he will be following her in surveillance for her anal cancer.  She is agreement with this plan.

## 2021-04-13 ENCOUNTER — Other Ambulatory Visit: Payer: Self-pay

## 2021-04-13 ENCOUNTER — Ambulatory Visit
Admission: RE | Admit: 2021-04-13 | Discharge: 2021-04-13 | Disposition: A | Discharge status: Home / Self Care | Payer: BC Managed Care – PPO | Source: Ambulatory Visit | Attending: Radiation Oncology | Admitting: Radiation Oncology

## 2021-04-13 ENCOUNTER — Inpatient Hospital Stay: Payer: BC Managed Care – PPO

## 2021-04-13 DIAGNOSIS — C210 Malignant neoplasm of anus, unspecified: Secondary | ICD-10-CM | POA: Diagnosis not present

## 2021-04-13 DIAGNOSIS — L539 Erythematous condition, unspecified: Secondary | ICD-10-CM | POA: Diagnosis not present

## 2021-04-13 DIAGNOSIS — Z7289 Other problems related to lifestyle: Secondary | ICD-10-CM | POA: Diagnosis not present

## 2021-04-13 DIAGNOSIS — L309 Dermatitis, unspecified: Secondary | ICD-10-CM | POA: Diagnosis not present

## 2021-04-13 DIAGNOSIS — Z79899 Other long term (current) drug therapy: Secondary | ICD-10-CM | POA: Diagnosis not present

## 2021-04-13 DIAGNOSIS — D696 Thrombocytopenia, unspecified: Secondary | ICD-10-CM | POA: Diagnosis not present

## 2021-04-13 DIAGNOSIS — K625 Hemorrhage of anus and rectum: Secondary | ICD-10-CM | POA: Diagnosis not present

## 2021-04-13 DIAGNOSIS — L988 Other specified disorders of the skin and subcutaneous tissue: Secondary | ICD-10-CM | POA: Diagnosis not present

## 2021-04-13 DIAGNOSIS — Z720 Tobacco use: Secondary | ICD-10-CM | POA: Diagnosis not present

## 2021-04-13 DIAGNOSIS — N6312 Unspecified lump in the right breast, upper inner quadrant: Secondary | ICD-10-CM | POA: Diagnosis not present

## 2021-04-13 DIAGNOSIS — Z5111 Encounter for antineoplastic chemotherapy: Secondary | ICD-10-CM | POA: Diagnosis not present

## 2021-04-13 DIAGNOSIS — C211 Malignant neoplasm of anal canal: Secondary | ICD-10-CM | POA: Diagnosis not present

## 2021-04-13 LAB — CBC WITH DIFFERENTIAL (CANCER CENTER ONLY)
Abs Immature Granulocytes: 0.03 10*3/uL (ref 0.00–0.07)
Basophils Absolute: 0 10*3/uL (ref 0.0–0.1)
Basophils Relative: 0 %
Eosinophils Absolute: 0.1 10*3/uL (ref 0.0–0.5)
Eosinophils Relative: 1 %
HCT: 39.1 % (ref 36.0–46.0)
Hemoglobin: 13.1 g/dL (ref 12.0–15.0)
Immature Granulocytes: 1 %
Lymphocytes Relative: 11 %
Lymphs Abs: 0.6 10*3/uL — ABNORMAL LOW (ref 0.7–4.0)
MCH: 33.3 pg (ref 26.0–34.0)
MCHC: 33.5 g/dL (ref 30.0–36.0)
MCV: 99.5 fL (ref 80.0–100.0)
Monocytes Absolute: 0.5 10*3/uL (ref 0.1–1.0)
Monocytes Relative: 10 %
Neutro Abs: 3.9 10*3/uL (ref 1.7–7.7)
Neutrophils Relative %: 77 %
Platelet Count: 154 10*3/uL (ref 150–400)
RBC: 3.93 MIL/uL (ref 3.87–5.11)
RDW: 12.9 % (ref 11.5–15.5)
WBC Count: 5.1 10*3/uL (ref 4.0–10.5)
nRBC: 0 % (ref 0.0–0.2)

## 2021-04-14 ENCOUNTER — Ambulatory Visit
Admission: RE | Admit: 2021-04-14 | Discharge: 2021-04-14 | Disposition: A | Discharge status: Home / Self Care | Payer: BC Managed Care – PPO | Source: Ambulatory Visit | Attending: Radiation Oncology | Admitting: Radiation Oncology

## 2021-04-14 DIAGNOSIS — C211 Malignant neoplasm of anal canal: Secondary | ICD-10-CM | POA: Diagnosis not present

## 2021-04-15 ENCOUNTER — Ambulatory Visit
Admission: RE | Admit: 2021-04-15 | Discharge: 2021-04-15 | Disposition: A | Discharge status: Home / Self Care | Payer: BC Managed Care – PPO | Source: Ambulatory Visit | Attending: Radiation Oncology | Admitting: Radiation Oncology

## 2021-04-15 ENCOUNTER — Other Ambulatory Visit: Payer: Self-pay

## 2021-04-15 DIAGNOSIS — C211 Malignant neoplasm of anal canal: Secondary | ICD-10-CM | POA: Diagnosis not present

## 2021-04-17 ENCOUNTER — Other Ambulatory Visit: Payer: Self-pay | Admitting: Oncology

## 2021-04-18 ENCOUNTER — Encounter: Payer: Self-pay | Admitting: *Deleted

## 2021-04-18 ENCOUNTER — Ambulatory Visit (HOSPITAL_COMMUNITY)
Admission: RE | Admit: 2021-04-18 | Discharge: 2021-04-18 | Disposition: A | Discharge status: Home / Self Care | Payer: BC Managed Care – PPO | Source: Ambulatory Visit | Attending: Oncology | Admitting: Oncology

## 2021-04-18 ENCOUNTER — Inpatient Hospital Stay (HOSPITAL_BASED_OUTPATIENT_CLINIC_OR_DEPARTMENT_OTHER): Payer: BC Managed Care – PPO | Admitting: Oncology

## 2021-04-18 ENCOUNTER — Inpatient Hospital Stay: Payer: BC Managed Care – PPO | Attending: Nurse Practitioner

## 2021-04-18 ENCOUNTER — Ambulatory Visit
Admission: RE | Admit: 2021-04-18 | Discharge: 2021-04-18 | Disposition: A | Discharge status: Home / Self Care | Payer: BC Managed Care – PPO | Source: Ambulatory Visit | Attending: Radiation Oncology | Admitting: Radiation Oncology

## 2021-04-18 ENCOUNTER — Inpatient Hospital Stay: Payer: BC Managed Care – PPO

## 2021-04-18 ENCOUNTER — Other Ambulatory Visit: Payer: Self-pay

## 2021-04-18 VITALS — BP 112/79 | HR 89 | Temp 97.8°F | Resp 20 | Ht 67.0 in | Wt 157.0 lb

## 2021-04-18 DIAGNOSIS — Z5111 Encounter for antineoplastic chemotherapy: Secondary | ICD-10-CM | POA: Insufficient documentation

## 2021-04-18 DIAGNOSIS — D696 Thrombocytopenia, unspecified: Secondary | ICD-10-CM | POA: Insufficient documentation

## 2021-04-18 DIAGNOSIS — C210 Malignant neoplasm of anus, unspecified: Secondary | ICD-10-CM

## 2021-04-18 DIAGNOSIS — Z720 Tobacco use: Secondary | ICD-10-CM | POA: Insufficient documentation

## 2021-04-18 DIAGNOSIS — L98499 Non-pressure chronic ulcer of skin of other sites with unspecified severity: Secondary | ICD-10-CM | POA: Diagnosis not present

## 2021-04-18 DIAGNOSIS — L309 Dermatitis, unspecified: Secondary | ICD-10-CM | POA: Diagnosis not present

## 2021-04-18 DIAGNOSIS — Z79899 Other long term (current) drug therapy: Secondary | ICD-10-CM | POA: Insufficient documentation

## 2021-04-18 DIAGNOSIS — Z7289 Other problems related to lifestyle: Secondary | ICD-10-CM | POA: Insufficient documentation

## 2021-04-18 DIAGNOSIS — C211 Malignant neoplasm of anal canal: Secondary | ICD-10-CM | POA: Insufficient documentation

## 2021-04-18 DIAGNOSIS — Z510 Encounter for antineoplastic radiation therapy: Secondary | ICD-10-CM | POA: Diagnosis not present

## 2021-04-18 DIAGNOSIS — Z452 Encounter for adjustment and management of vascular access device: Secondary | ICD-10-CM | POA: Diagnosis not present

## 2021-04-18 HISTORY — PX: IR FLUORO GUIDE CV LINE RIGHT: IMG2283

## 2021-04-18 LAB — CBC WITH DIFFERENTIAL (CANCER CENTER ONLY)
Abs Immature Granulocytes: 0.03 10*3/uL (ref 0.00–0.07)
Basophils Absolute: 0 10*3/uL (ref 0.0–0.1)
Basophils Relative: 0 %
Eosinophils Absolute: 0.1 10*3/uL (ref 0.0–0.5)
Eosinophils Relative: 2 %
HCT: 37 % (ref 36.0–46.0)
Hemoglobin: 12.6 g/dL (ref 12.0–15.0)
Immature Granulocytes: 1 %
Lymphocytes Relative: 9 %
Lymphs Abs: 0.5 10*3/uL — ABNORMAL LOW (ref 0.7–4.0)
MCH: 33.7 pg (ref 26.0–34.0)
MCHC: 34.1 g/dL (ref 30.0–36.0)
MCV: 98.9 fL (ref 80.0–100.0)
Monocytes Absolute: 0.4 10*3/uL (ref 0.1–1.0)
Monocytes Relative: 6 %
Neutro Abs: 4.6 10*3/uL (ref 1.7–7.7)
Neutrophils Relative %: 82 %
Platelet Count: 179 10*3/uL (ref 150–400)
RBC: 3.74 MIL/uL — ABNORMAL LOW (ref 3.87–5.11)
RDW: 13.1 % (ref 11.5–15.5)
WBC Count: 5.6 10*3/uL (ref 4.0–10.5)
nRBC: 0 % (ref 0.0–0.2)

## 2021-04-18 LAB — CMP (CANCER CENTER ONLY)
ALT: 32 U/L (ref 0–44)
AST: 19 U/L (ref 15–41)
Albumin: 4.2 g/dL (ref 3.5–5.0)
Alkaline Phosphatase: 55 U/L (ref 38–126)
Anion gap: 7 (ref 5–15)
BUN: 7 mg/dL (ref 6–20)
CO2: 27 mmol/L (ref 22–32)
Calcium: 9.2 mg/dL (ref 8.9–10.3)
Chloride: 103 mmol/L (ref 98–111)
Creatinine: 0.57 mg/dL (ref 0.44–1.00)
GFR, Estimated: >60 mL/min (ref >60)
Glucose, Bld: 134 mg/dL — ABNORMAL HIGH (ref 70–99)
Potassium: 3.7 mmol/L (ref 3.5–5.1)
Sodium: 137 mmol/L (ref 135–145)
Total Bilirubin: 0.5 mg/dL (ref 0.3–1.2)
Total Protein: 6.6 g/dL (ref 6.5–8.1)

## 2021-04-18 IMAGING — US IR FLUORO GUIDE CV LINE*R*
2 series · 3 of 3 positions shown · non-contrast
Comparison: none

INDICATION: 49-year-old female referred for PICC placement

[Series 1: ir fluoro/shunt/fist · 2 of 2 slices shown]
[im 1/2]
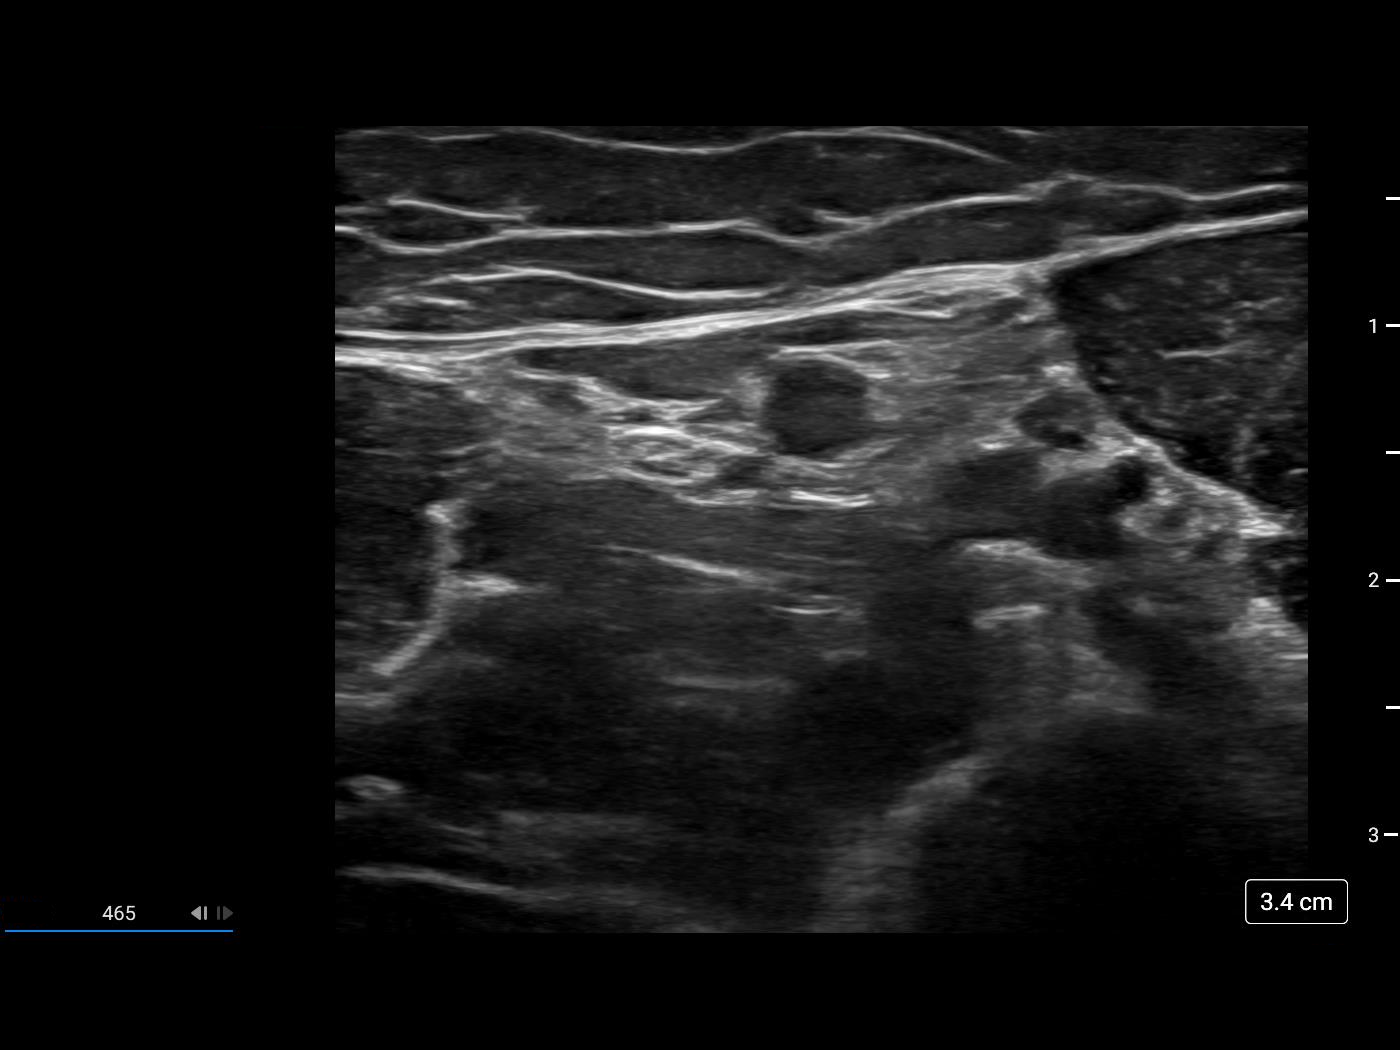
[im 2/2]
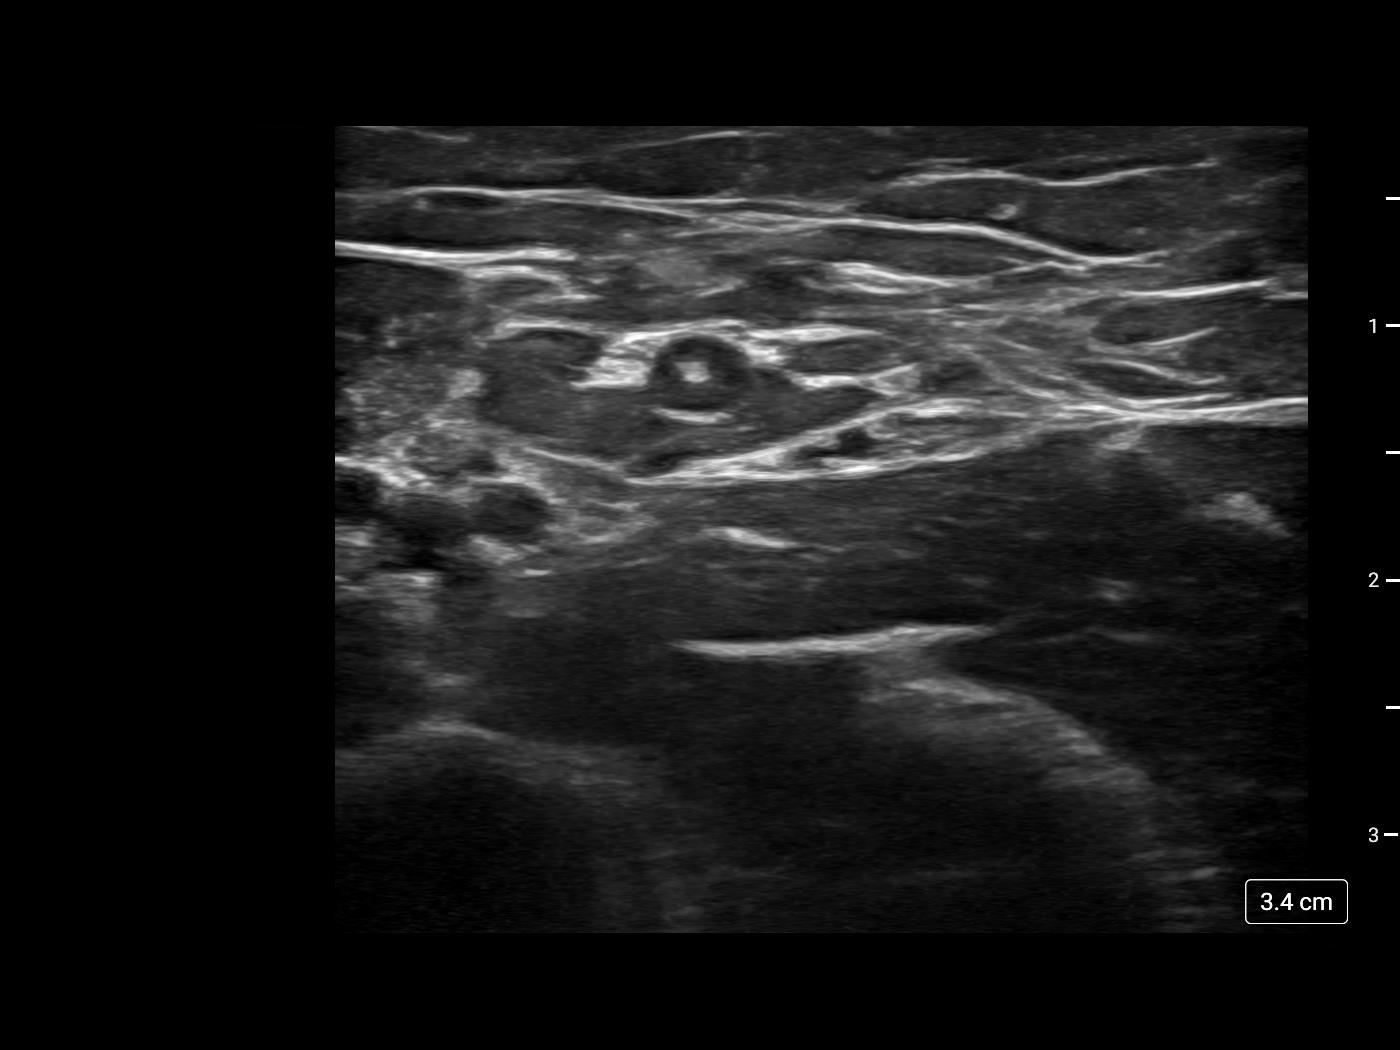

[Series 300: line placements · 1 of 1 slices shown]
[im 1/1]
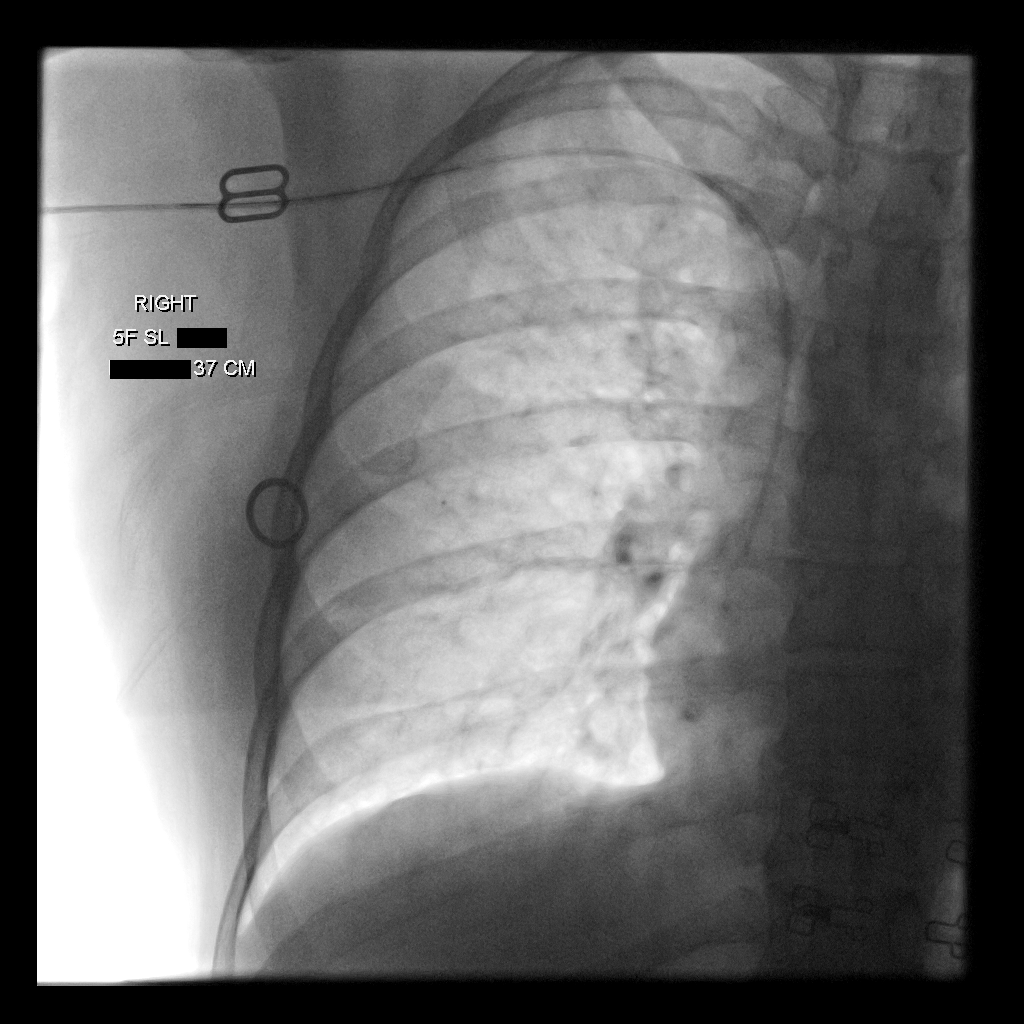

[3 of 3 positions shown; findings below may reference images not displayed]

EXAM:
PICC LINE PLACEMENT WITH ULTRASOUND AND FLUOROSCOPIC GUIDANCE

MEDICATIONS:
None

ANESTHESIA/SEDATION:
None

FLUOROSCOPY TIME:  Fluoroscopy Time: 0 minutes 6 seconds (1 mGy).

COMPLICATIONS:
None

PROCEDURE:
Informed written consent was obtained from the patient after a
thorough discussion of the procedural risks, benefits and
alternatives. All questions were addressed. Maximal Sterile Barrier
Technique was utilized including caps, mask, sterile gowns, sterile
gloves, sterile drape, hand hygiene and skin antiseptic. A timeout
was performed prior to the initiation of the procedure.

Patient was position in the supine position on the fluoroscopy table
with the right arm abducted 90 degrees. Ultrasound survey of the
upper extremity was performed with images stored and sent to PACs.

The right basilic vein was selected for access.

Once the patient was prepped and draped in the usual sterile
fashion, the skin and subcutaneous tissues were generously
infiltrated with 1% lidocaine for local anesthesia.

A micropuncture access kit was then used to access the targeted
vein. Wire was passed centrally, confirmed to be within the venous
system under fluoroscopy. A small stab incision was made with an 11
blade scalpel and the sheath was then placed over the wire.
Estimated length of the catheter was then performed with the
indwelling wire.

Catheter was amputated at 37 cm length and placed with coaxial wire
through the peel-away.

Single-lumen, power injectable PICC in the basilic vein. Tip
confirmed at the cavoatrial junction, and the catheter is ready for
use.

Stat lock was placed.

Patient tolerated the procedure well and remained hemodynamically
stable throughout.

No complications were encountered and no significant blood loss was
encountered.
IMPRESSION: Status post right upper extremity basilic vein PICC, single lumen.

## 2021-04-18 MED ORDER — SODIUM CHLORIDE 0.9 % IV SOLN
Freq: Once | INTRAVENOUS | Status: AC
  Filled 2021-04-18: qty 250

## 2021-04-18 MED ORDER — LIDOCAINE HCL 1 % IJ SOLN
INTRAMUSCULAR | Status: DC | PRN
  Administered 2021-04-18: 5 mL via INTRADERMAL

## 2021-04-18 MED ORDER — PROCHLORPERAZINE MALEATE 10 MG PO TABS
ORAL_TABLET | ORAL | Status: AC
  Filled 2021-04-18: qty 1

## 2021-04-18 MED ORDER — LIDOCAINE HCL 1 % IJ SOLN
INTRAMUSCULAR | Status: AC
  Filled 2021-04-18: qty 20

## 2021-04-18 MED ORDER — MITOMYCIN CHEMO IV INJECTION 20 MG
10.0000 mg/m2 | Freq: Once | INTRAVENOUS | Status: AC
  Administered 2021-04-18: 18.5 mg via INTRAVENOUS
  Filled 2021-04-18: qty 37

## 2021-04-18 MED ORDER — SODIUM CHLORIDE 0.9 % IV SOLN
800.0000 mg/m2/d | INTRAVENOUS | Status: DC
  Administered 2021-04-18: 5900 mg via INTRAVENOUS
  Filled 2021-04-18: qty 118

## 2021-04-18 MED ORDER — HEPARIN SOD (PORK) LOCK FLUSH 100 UNIT/ML IV SOLN
INTRAVENOUS | Status: DC | PRN
  Administered 2021-04-18: 500 [IU]

## 2021-04-18 MED ORDER — OXYCODONE HCL 5 MG PO TABS: 5.0000 mg | ORAL_TABLET | Freq: Four times a day (QID) | ORAL | 0 refills | Status: DC | PRN

## 2021-04-18 MED ORDER — PROCHLORPERAZINE MALEATE 10 MG PO TABS
10.0000 mg | ORAL_TABLET | Freq: Once | ORAL | Status: AC
  Administered 2021-04-18: 10 mg via ORAL

## 2021-04-18 MED ORDER — HEPARIN SOD (PORK) LOCK FLUSH 100 UNIT/ML IV SOLN
INTRAVENOUS | Status: AC
  Filled 2021-04-18: qty 5

## 2021-04-18 NOTE — Progress Notes (Signed)
[name] OFFICE PROGRESS NOTE   Diagnosis: Anal cancer  INTERVAL HISTORY:   B[name] returns as scheduled.  She continues radiation.  She has pain related to skin breakdown at the perineum.  She uses oxycodone as needed for pain. She underwent MRI guided biopsy of the right breast lesion on 04/11/2021.  The pathology revealed fibrocystic change with sclerosing adenosis.  There was a small focus of inflammation and fibrosis consistent with resolving fat necrosis.  No evidence of malignancy.    Objective:  Vital signs in last 24 hours:  Blood pressure 112/79, pulse 89, temperature 97.8 F (36.6 C), temperature source Oral, resp. rate 20, height 5' 7"  (1.702 m), weight 157 lb (71.2 kg), last menstrual period 03/13/2012, SpO2 100 %.    HEENT: No thrush or ulcers Resp: Lungs clear bilaterally Cardio: Regular rate and rhythm GI: Nontender, no hepatosplenomegaly Vascular: No leg edema  Skin: Erythema and skin breakdown at the perineum extending to the lower gluteal fold, erythema of the labia  Portacath/PICC-without erythema  Lab Results:  Lab Results  Component Value Date   WBC 5.6 04/18/2021   HGB 12.6 04/18/2021   HCT 37.0 04/18/2021   MCV 98.9 04/18/2021   PLT 179 04/18/2021   NEUTROABS 4.6 04/18/2021    CMP  Lab Results  Component Value Date   NA 137 04/18/2021   K 3.7 04/18/2021   CL 103 04/18/2021   CO2 27 04/18/2021   GLUCOSE 134 (H) 04/18/2021   BUN 7 04/18/2021   CREATININE 0.57 04/18/2021   CALCIUM 9.2 04/18/2021   PROT 6.6 04/18/2021   ALBUMIN 4.2 04/18/2021   AST 19 04/18/2021   ALT 32 04/18/2021   ALKPHOS 55 04/18/2021   BILITOT 0.5 04/18/2021   GFRNONAA >60 04/18/2021   GFRAA 106 11/29/2020    No results found for: CEA1  No results found for: INR  Imaging:  IR Fluoro Guide CV Line Right  Result Date: 04/18/2021 INDICATION: 50 year old female referred for PICC placement EXAM: PICC LINE PLACEMENT WITH ULTRASOUND AND  FLUOROSCOPIC GUIDANCE MEDICATIONS: None ANESTHESIA/SEDATION: None FLUOROSCOPY TIME:  Fluoroscopy Time: 0 minutes 6 seconds (1 mGy). COMPLICATIONS: None PROCEDURE: Informed written consent was obtained from the patient after a thorough discussion of the procedural risks, benefits and alternatives. All questions were addressed. Maximal Sterile Barrier Technique was utilized including caps, mask, sterile gowns, sterile gloves, sterile drape, hand hygiene and skin antiseptic. A timeout was performed prior to the initiation of the procedure. Patient was position in the supine position on the fluoroscopy table with the right arm abducted 90 degrees. Ultrasound survey of the upper extremity was performed with images stored and sent to PACs. The right basilic vein was selected for access. Once the patient was prepped and draped in the usual sterile fashion, the skin and subcutaneous tissues were generously infiltrated with 1% lidocaine for local anesthesia. A micropuncture access kit was then used to access the targeted vein. Wire was passed centrally, confirmed to be within the venous system under fluoroscopy. A small stab incision was made with an 11 blade scalpel and the sheath was then placed over the wire. Estimated length of the catheter was then performed with the indwelling wire. Catheter was amputated at 37 cm length and placed with coaxial wire through the peel-away. Single-lumen, power injectable PICC in the basilic vein. Tip confirmed at the cavoatrial junction, and the catheter is ready for use. Stat lock was placed. Patient tolerated the procedure well and remained hemodynamically stable throughout.  No complications were encountered and no significant blood loss was encountered. IMPRESSION: Status post right upper extremity basilic vein PICC, single lumen. Signed, Dulcy Fanny. Dellia Nims, RPVI [facility] Rad[facility] Electronically Signed   By: Corrie Mckusick D.O.    On: 04/18/2021 11:49    Medications: I have reviewed the patient's current medications.   Assessment/Plan: 1. Squamous cell carcinoma of the anal canal ? Colonoscopy 01/28/2021-rectal mass extending to the "outside ", palpable on exam-biopsy revealed high-grade squamous intraepithelial lesion (AIN 3/carcinoma in situ) ? 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 CTs 03/07/2021-ill-definition of tissue planes along the anus with abnormal soft tissue prominence posteriorly along the anal canal suspicious for mass.  Small adjacent lower perirectal lymph nodes in addition to a 0.5 cm sacral node.  Focal enhancing lesion in the right hepatic lobe 1.2 x 0.8 x 0.9 cm.  2.1 x 1.5 cm cystic lesion right ovary with thin enhancing rim. ? PET scan 03/16/2021-hypermetabolic mass within the anal canal.  No definite evidence of local or distant metastatic disease.  7 mm left submandibular lymph node with mild FDG uptake, likely reactive.  Focal site of FDG uptake within the inner quadrant of the right breast. ? Radiation 03/21/2021 ? Cycle one 5-FU/mitomycin-C 03/21/2021 ? Cycle two 5-FU/Mitomycin-C 04/18/2021, 5-FU dose reduced secondary to mucositis 2. Remote history of an anal fissure repair 3. Tobacco use 4. Moderate alcohol use 5. Eczema 6. Right breast mass- PET scan 03/16/2021 with focal FDG activity in the inner right breast  Mammogram and right breast ultrasound 03/28/2021- negative  Bilateral breast MRI 03/31/2021- 7 mm mass in the upper inner right breast correlating with the PET findings, no other  abnormality, no adenopathy  MRI guided biopsy of right breast mass 04/11/2021- fibrocystic change with sclerosing adenosis, small focus of inflammation/fibrosis suggestive of resolving fat necrosis, no evidence of malignancy      Disposition: Beth [name] continues daily radiation for treatment of anal cancer.  She has developed skin breakdown at the perineum.  She will continue oxycodone as needed for pain.  The plan is to complete cycle two 5-FU/mitomycin today.  The 5-FU will be dose reduced secondary to mucositis.  She will return for an office and lab visit on 04/28/2021.  I refilled her prescription for oxycodone.    Biopsy of the right breast mass was negative for malignancy.  [name], MD  04/18/2021  1:13 PM

## 2021-04-18 NOTE — Patient Instructions (Signed)
[facility]  Discharge Instructions: Thank you for choosing [facility] to provide your oncology and hematology care.   If you have a lab appointment with the [facility], please go directly to the [facility] and check in at the registration area.   Wear comfortable clothing and clothing appropriate for easy access to any Portacath or PICC line.   We strive to give you quality time with your provider. You may need to reschedule your appointment if you arrive late (15 or more minutes).  Arriving late affects you and other patients whose appointments are after yours.  Also, if you miss three or more appointments without notifying the office, you may be dismissed from the clinic at the provider's discretion.      For prescription refill requests, have your pharmacy contact our office and allow 72 hours for refills to be completed.    Today you received the following chemotherapy and/or immunotherapy agents Mitomycin and 5FU      To help prevent nausea and vomiting after your treatment, we encourage you to take your nausea medication as directed.  BELOW ARE SYMPTOMS THAT SHOULD BE REPORTED IMMEDIATELY: . *FEVER GREATER THAN 100.4 F (38 C) OR HIGHER . *CHILLS OR SWEATING . *NAUSEA AND VOMITING THAT IS NOT CONTROLLED WITH YOUR NAUSEA MEDICATION . *UNUSUAL SHORTNESS OF BREATH . *UNUSUAL BRUISING OR BLEEDING . *URINARY PROBLEMS (pain or burning when urinating, or frequent urination) . *BOWEL PROBLEMS (unusual diarrhea, constipation, pain near the anus) . TENDERNESS IN MOUTH AND THROAT WITH OR WITHOUT PRESENCE OF ULCERS (sore throat, sores in mouth, or a toothache) . UNUSUAL RASH, SWELLING OR PAIN  . UNUSUAL VAGINAL DISCHARGE OR ITCHING   Items with * indicate a potential emergency and should be followed up as soon as possible or go to the Emergency Department if any problems should occur.  Please show the CHEMOTHERAPY ALERT CARD or IMMUNOTHERAPY  ALERT CARD at check-in to the Emergency Department and triage nurse.  Should you have questions after your visit or need to cancel or reschedule your appointment, please contact [facility]  Dept: [phone]  and follow the prompts.  Office hours are 8:00 a.m. to 4:30 p.m. Monday - Friday. Please note that voicemails left after 4:00 p.m. may not be returned until the following business day.  We are closed weekends and major holidays. You have access to a nurse at all times for urgent questions. Please call the main number to the clinic Dept: [phone] and follow the prompts.   For any non-urgent questions, you may also contact your provider using MyChart. We now offer e-Visits for anyone 60 and older to request care online for non-urgent symptoms. For details visit [email or web address].   Also download the MyChart app! Go to the app store, search "MyChart", open the app, select [facility], and log in with your MyChart username and password.  Due to Covid, a mask is required upon entering the hospital/clinic. If you do not have a mask, one will be given to you upon arrival. For doctor visits, patients may have 1 support person aged 67 or older with them. For treatment visits, patients cannot have anyone with them due to current Covid guidelines and our immunocompromised population.  Fluorouracil, 5-FU injection What is this medicine? FLUOROURACIL, 5-FU (flure oh YOOR a sil) is a chemotherapy drug. It slows the growth of cancer cells. This medicine is used to treat many types of cancer like breast cancer,  colon or rectal cancer, pancreatic cancer, and stomach cancer. This medicine may be used for other purposes; ask your health care provider or pharmacist if you have questions. COMMON BRAND NAME(S): Adrucil What should I tell my health care provider before I take this medicine? They need to know if you have any of these conditions:  blood  disorders  dihydropyrimidine dehydrogenase (DPD) deficiency  infection (especially a virus infection such as chickenpox, cold sores, or herpes)  kidney disease  liver disease  malnourished, poor nutrition  recent or ongoing radiation therapy  an unusual or allergic reaction to fluorouracil, other chemotherapy, other medicines, foods, dyes, or preservatives  pregnant or trying to get pregnant  breast-feeding How should I use this medicine? This drug is given as an infusion or injection into a vein. It is administered in a hospital or clinic by a specially trained health care professional. Talk to your pediatrician regarding the use of this medicine in children. Special care may be needed. Overdosage: If you think you have taken too much of this medicine contact a poison control center or emergency room at once. NOTE: This medicine is only for you. Do not share this medicine with others. What if I miss a dose? It is important not to miss your dose. Call your doctor or health care professional if you are unable to keep an appointment. What may interact with this medicine? Do not take this medicine with any of the following medications:  live virus vaccines This medicine may also interact with the following medications:  medicines that treat or prevent blood clots like warfarin, enoxaparin, and dalteparin This list may not describe all possible interactions. Give your health care provider a list of all the medicines, herbs, non-prescription drugs, or dietary supplements you use. Also tell them if you smoke, drink alcohol, or use illegal drugs. Some items may interact with your medicine. What should I watch for while using this medicine? Visit your doctor for checks on your progress. This drug may make you feel generally unwell. This is not uncommon, as chemotherapy can affect healthy cells as well as cancer cells. Report any side effects. Continue your course of treatment even though  you feel ill unless your doctor tells you to stop. In some cases, you may be given additional medicines to help with side effects. Follow all directions for their use. Call your doctor or health care professional for advice if you get a fever, chills or sore throat, or other symptoms of a cold or flu. Do not treat yourself. This drug decreases your body's ability to fight infections. Try to avoid being around people who are sick. This medicine may increase your risk to bruise or bleed. Call your doctor or health care professional if you notice any unusual bleeding. Be careful brushing and flossing your teeth or using a toothpick because you may get an infection or bleed more easily. If you have any dental work done, tell your dentist you are receiving this medicine. Avoid taking products that contain aspirin, acetaminophen, ibuprofen, naproxen, or ketoprofen unless instructed by your doctor. These medicines may hide a fever. Do not become pregnant while taking this medicine. Women should inform their doctor if they wish to become pregnant or think they might be pregnant. There is a potential for serious side effects to an unborn child. Talk to your health care professional or pharmacist for more information. Do not breast-feed an infant while taking this medicine. Men should inform their doctor if they wish to  father a child. This medicine may lower sperm counts. Do not treat diarrhea with over the counter products. Contact your doctor if you have diarrhea that lasts more than 2 days or if it is severe and watery. This medicine can make you more sensitive to the sun. Keep out of the sun. If you cannot avoid being in the sun, wear protective clothing and use sunscreen. Do not use sun lamps or tanning beds/booths. What side effects may I notice from receiving this medicine? Side effects that you should report to your doctor or health care professional as soon as possible:  allergic reactions like skin  rash, itching or hives, swelling of the face, lips, or tongue  low blood counts - this medicine may decrease the number of white blood cells, red blood cells and platelets. You may be at increased risk for infections and bleeding.  signs of infection - fever or chills, cough, sore throat, pain or difficulty passing urine  signs of decreased platelets or bleeding - bruising, pinpoint red spots on the skin, black, tarry stools, blood in the urine  signs of decreased red blood cells - unusually weak or tired, fainting spells, lightheadedness  breathing problems  changes in vision  chest pain  mouth sores  nausea and vomiting  pain, swelling, redness at site where injected  pain, tingling, numbness in the hands or feet  redness, swelling, or sores on hands or feet  stomach pain  unusual bleeding Side effects that usually do not require medical attention (report to your doctor or health care professional if they continue or are bothersome):  changes in finger or toe nails  diarrhea  dry or itchy skin  hair loss  headache  loss of appetite  sensitivity of eyes to the light  stomach upset  unusually teary eyes This list may not describe all possible side effects. Call your doctor for medical advice about side effects. You may report side effects to FDA at 1-800-FDA-1088. Where should I keep my medicine? This drug is given in a hospital or clinic and will not be stored at home. NOTE: This sheet is a summary. It may not cover all possible information. If you have questions about this medicine, talk to your doctor, pharmacist, or health care provider.  2021 Elsevier/Gold Standard (2019-11-04 15:00:03) Mitomycin injection What is this medicine? MITOMYCIN (mye toe MYE sin) is a chemotherapy drug. This medicine is used to treat cancer of the stomach and pancreas. This medicine may be used for other purposes; ask your health care provider or pharmacist if you have  questions. COMMON BRAND NAME(S): Mutamycin What should I tell my health care provider before I take this medicine? They need to know if you have any of these conditions:  bleeding disorders  infection (especially a viral infection such as chickenpox, cold sores, or herpes)  low blood counts, like white cells, platelets, or red blood cells  kidney disease  an unusual or allergic reaction to mitomycin, other medicines, foods, dyes, or preservatives  pregnant or trying to get pregnant  breast-feeding How should I use this medicine? This drug is given as an injection or infusion into a vein. It is administered in a hospital or clinic by a specially trained health care professional. Talk to your pediatrician regarding the use of this medicine in children. Special care may be needed. Overdosage: If you think you have taken too much of this medicine contact a poison control center or emergency room at once. NOTE: This medicine is  only for you. Do not share this medicine with others. What if I miss a dose? It is important not to miss your dose. Call your doctor or health care professional if you are unable to keep an appointment. What may interact with this medicine? Interactions are not expected. This list may not describe all possible interactions. Give your health care provider a list of all the medicines, herbs, non-prescription drugs, or dietary supplements you use. Also tell them if you smoke, drink alcohol, or use illegal drugs. Some items may interact with your medicine. What should I watch for while using this medicine? Your condition will be monitored carefully while you are receiving this medicine. You will need important blood work done while you are taking this medicine. This drug may make you feel generally unwell. This is not uncommon, as chemotherapy can affect healthy cells as well as cancer cells. Report any side effects. Continue your course of treatment even though you feel  ill unless your doctor tells you to stop. Call your doctor or health care professional for advice if you get a fever, chills or sore throat, or other symptoms of a cold or flu. Do not treat yourself. This drug decreases your body's ability to fight infections. Try to avoid being around people who are sick. This medicine may increase your risk to bruise or bleed. Call your doctor or health care professional if you notice any unusual bleeding. Be careful brushing and flossing your teeth or using a toothpick because you may get an infection or bleed more easily. If you have any dental work done, tell your dentist you are receiving this medicine. Avoid taking products that contain aspirin, acetaminophen, ibuprofen, naproxen, or ketoprofen unless instructed by your doctor. These medicines may hide a fever. Do not become pregnant while taking this medicine. Women should inform their doctor if they wish to become pregnant or think they might be pregnant. There is a potential for serious side effects to an unborn child. Talk to your health care professional or pharmacist for more information. Do not breast-feed an infant while taking this medicine. What side effects may I notice from receiving this medicine? Side effects that you should report to your doctor or health care professional as soon as possible:  allergic reactions like skin rash, itching or hives, swelling of the face, lips, or tongue  breathing problems  pain, redness, or irritation at site where injected  signs and symptoms of bleeding such as bloody or black, tarry stools; red or dark brown urine; spitting up blood or brown material that looks like coffee grounds; red spots on the skin; unusual bruising or bleeding from the eyes, gums, or nose  signs and symptoms of infection like fever; chills; cough; sore throat; pain or trouble passing urine  signs and symptoms of kidney injury like trouble passing urine or change in the amount of  urine  signs and symptoms of low red blood cells or anemia such as unusually weak or tired; feeling faint or lightheaded; falls; breathing problems Side effects that usually do not require medical attention (report to your doctor or health care professional if they continue or are bothersome):  green to blue color of urine  hair loss  loss of appetite  mouth sores  nausea, vomiting This list may not describe all possible side effects. Call your doctor for medical advice about side effects. You may report side effects to FDA at 1-800-FDA-1088. Where should I keep my medicine? This drug is given in  a hospital or clinic and will not be stored at home. NOTE: This sheet is a summary. It may not cover all possible information. If you have questions about this medicine, talk to your doctor, pharmacist, or health care provider.  2021 Elsevier/Gold Standard (2019-07-22 16:33:50)

## 2021-04-18 NOTE — Procedures (Signed)
Interventional Radiology Procedure Note  Procedure: Placement of a right UE/basilic vein approach single lumen PICC.  Tip is positioned at the superior cavoatrial junction and catheter is ready for immediate use.   Complications: None Recommendations:  - Ok to use - Do not submerge - Routine line care   Signed,  Dulcy Fanny. Earleen Newport, DO

## 2021-04-19 ENCOUNTER — Encounter: Payer: Self-pay | Admitting: *Deleted

## 2021-04-19 ENCOUNTER — Ambulatory Visit
Admission: RE | Admit: 2021-04-19 | Discharge: 2021-04-19 | Disposition: A | Discharge status: Home / Self Care | Payer: BC Managed Care – PPO | Source: Ambulatory Visit | Attending: Radiation Oncology | Admitting: Radiation Oncology

## 2021-04-19 DIAGNOSIS — C211 Malignant neoplasm of anal canal: Secondary | ICD-10-CM | POA: Diagnosis not present

## 2021-04-19 DIAGNOSIS — Z510 Encounter for antineoplastic radiation therapy: Secondary | ICD-10-CM | POA: Diagnosis not present

## 2021-04-19 NOTE — Progress Notes (Signed)
[facility] form w/supporting chart information to [phone]. Copy to HIM for scanning and will give original back to patient on 04/22/21 visit.

## 2021-04-20 ENCOUNTER — Other Ambulatory Visit: Payer: Self-pay

## 2021-04-20 ENCOUNTER — Ambulatory Visit
Admission: RE | Admit: 2021-04-20 | Discharge: 2021-04-20 | Disposition: A | Discharge status: Home / Self Care | Payer: BC Managed Care – PPO | Source: Ambulatory Visit | Attending: Radiation Oncology | Admitting: Radiation Oncology

## 2021-04-20 DIAGNOSIS — Z510 Encounter for antineoplastic radiation therapy: Secondary | ICD-10-CM | POA: Diagnosis not present

## 2021-04-20 DIAGNOSIS — C211 Malignant neoplasm of anal canal: Secondary | ICD-10-CM | POA: Diagnosis not present

## 2021-04-20 DIAGNOSIS — C210 Malignant neoplasm of anus, unspecified: Secondary | ICD-10-CM | POA: Diagnosis not present

## 2021-04-21 ENCOUNTER — Other Ambulatory Visit: Payer: Self-pay | Admitting: *Deleted

## 2021-04-21 ENCOUNTER — Other Ambulatory Visit: Payer: Self-pay

## 2021-04-21 ENCOUNTER — Ambulatory Visit
Admission: RE | Admit: 2021-04-21 | Discharge: 2021-04-21 | Disposition: A | Discharge status: Home / Self Care | Payer: BC Managed Care – PPO | Source: Ambulatory Visit | Attending: Radiation Oncology | Admitting: Radiation Oncology

## 2021-04-21 DIAGNOSIS — Z510 Encounter for antineoplastic radiation therapy: Secondary | ICD-10-CM | POA: Diagnosis not present

## 2021-04-21 DIAGNOSIS — C210 Malignant neoplasm of anus, unspecified: Secondary | ICD-10-CM

## 2021-04-21 DIAGNOSIS — C211 Malignant neoplasm of anal canal: Secondary | ICD-10-CM | POA: Diagnosis not present

## 2021-04-22 ENCOUNTER — Ambulatory Visit
Admission: RE | Admit: 2021-04-22 | Discharge: 2021-04-22 | Disposition: A | Discharge status: Home / Self Care | Payer: BC Managed Care – PPO | Source: Ambulatory Visit | Attending: Radiation Oncology | Admitting: Radiation Oncology

## 2021-04-22 ENCOUNTER — Inpatient Hospital Stay: Payer: BC Managed Care – PPO

## 2021-04-22 VITALS — BP 107/72 | HR 78 | Temp 98.0°F | Resp 20

## 2021-04-22 DIAGNOSIS — D696 Thrombocytopenia, unspecified: Secondary | ICD-10-CM | POA: Diagnosis not present

## 2021-04-22 DIAGNOSIS — Z7289 Other problems related to lifestyle: Secondary | ICD-10-CM | POA: Diagnosis not present

## 2021-04-22 DIAGNOSIS — Z510 Encounter for antineoplastic radiation therapy: Secondary | ICD-10-CM | POA: Diagnosis not present

## 2021-04-22 DIAGNOSIS — Z720 Tobacco use: Secondary | ICD-10-CM | POA: Diagnosis not present

## 2021-04-22 DIAGNOSIS — Z79899 Other long term (current) drug therapy: Secondary | ICD-10-CM | POA: Diagnosis not present

## 2021-04-22 DIAGNOSIS — C211 Malignant neoplasm of anal canal: Secondary | ICD-10-CM | POA: Diagnosis not present

## 2021-04-22 DIAGNOSIS — Z5111 Encounter for antineoplastic chemotherapy: Secondary | ICD-10-CM | POA: Diagnosis not present

## 2021-04-22 DIAGNOSIS — L98499 Non-pressure chronic ulcer of skin of other sites with unspecified severity: Secondary | ICD-10-CM | POA: Diagnosis not present

## 2021-04-22 DIAGNOSIS — C210 Malignant neoplasm of anus, unspecified: Secondary | ICD-10-CM

## 2021-04-22 DIAGNOSIS — L309 Dermatitis, unspecified: Secondary | ICD-10-CM | POA: Diagnosis not present

## 2021-04-22 MED ORDER — SODIUM CHLORIDE 0.9% FLUSH
10.0000 mL | Freq: Once | INTRAVENOUS | Status: AC
  Administered 2021-04-22: 10 mL via INTRAVENOUS
  Filled 2021-04-22: qty 10

## 2021-04-22 MED ORDER — HEPARIN SOD (PORK) LOCK FLUSH 100 UNIT/ML IV SOLN
500.0000 [IU] | Freq: Once | INTRAVENOUS | Status: DC | PRN
  Filled 2021-04-22: qty 5

## 2021-04-22 MED ORDER — HEPARIN SOD (PORK) LOCK FLUSH 100 UNIT/ML IV SOLN
250.0000 [IU] | Freq: Once | INTRAVENOUS | Status: AC | PRN
  Administered 2021-04-22: 250 [IU]
  Filled 2021-04-22: qty 5

## 2021-04-22 NOTE — Patient Instructions (Addendum)
Beth [name]  Discharge Instructions:  Thank you for choosing [facility] to provide your oncology and hematology care.   If you have a lab appointment with the [facility], please go directly to the [facility] and check in at the registration area.   Today we discontinued you from your 59fu pump and removed your PICC line. Please wear your dressing for 24 hours. You may remove your coban around 2:30.   We strive to give you quality time with your provider. You may need to reschedule your appointment if you arrive late (15 or more minutes).  Arriving late affects you and other patients whose appointments are after yours.  Also, if you miss three or more appointments without notifying the office, you may be dismissed from the clinic at the provider's discretion.      For prescription refill requests, have your pharmacy contact our office and allow 72 hours for refills to be completed.    Today you received the following chemotherapy and/or immunotherapy agents 5FU pump removed and PICC line pulled   To help prevent nausea and vomiting after your treatment, we encourage you to take your nausea medication as directed.  BELOW ARE SYMPTOMS THAT SHOULD BE REPORTED IMMEDIATELY: . *FEVER GREATER THAN 100.4 F (38 C) OR HIGHER . *CHILLS OR SWEATING . *NAUSEA AND VOMITING THAT IS NOT CONTROLLED WITH YOUR NAUSEA MEDICATION . *UNUSUAL SHORTNESS OF BREATH . *UNUSUAL BRUISING OR BLEEDING . *URINARY PROBLEMS (pain or burning when urinating, or frequent urination) . *BOWEL PROBLEMS (unusual diarrhea, constipation, pain near the anus) . TENDERNESS IN MOUTH AND THROAT WITH OR WITHOUT PRESENCE OF ULCERS (sore throat, sores in mouth, or a toothache) . UNUSUAL RASH, SWELLING OR PAIN  . UNUSUAL VAGINAL DISCHARGE OR ITCHING   Items with * indicate a potential emergency and should be followed up as soon as possible or go to the Emergency Department if any problems  should occur.  Please show the CHEMOTHERAPY ALERT CARD or IMMUNOTHERAPY ALERT CARD at check-in to the Emergency Department and triage nurse.  Should you have questions after your visit or need to cancel or reschedule your appointment, please contact [facility]  Dept: [phone]  and follow the prompts.  Office hours are 8:00 a.m. to 4:30 p.m. Monday - Friday. Please note that voicemails left after 4:00 p.m. may not be returned until the following business day.  We are closed weekends and major holidays. You have access to a nurse at all times for urgent questions. Please call the main number to the clinic Dept: [phone] and follow the prompts.   For any non-urgent questions, you may also contact your provider using MyChart. We now offer e-Visits for anyone 38 and older to request care online for non-urgent symptoms. For details visit [email or web address].   Also download the MyChart app! Go to the app store, search "MyChart", open the app, select [facility], and log in with your MyChart username and password.  Due to Covid, a mask is required upon entering the hospital/clinic. If you do not have a mask, one will be given to you upon arrival. For doctor visits, patients may have 1 support person aged 45 or older with them. For treatment visits, patients cannot have anyone with them due to current Covid guidelines and our immunocompromised population.    Fluorouracil, 5-FU injection What is this medicine? FLUOROURACIL, 5-FU (flure oh YOOR a sil) is a chemotherapy drug. It slows the growth  of cancer cells. This medicine is used to treat many types of cancer like breast cancer, colon or rectal cancer, pancreatic cancer, and stomach cancer. This medicine may be used for other purposes; ask your health care provider or pharmacist if you have questions. COMMON BRAND NAME(S): Adrucil What should I tell my health care provider before I take this medicine? They need to  know if you have any of these conditions:  blood disorders  dihydropyrimidine dehydrogenase (DPD) deficiency  infection (especially a virus infection such as chickenpox, cold sores, or herpes)  kidney disease  liver disease  malnourished, poor nutrition  recent or ongoing radiation therapy  an unusual or allergic reaction to fluorouracil, other chemotherapy, other medicines, foods, dyes, or preservatives  pregnant or trying to get pregnant  breast-feeding How should I use this medicine? This drug is given as an infusion or injection into a vein. It is administered in a hospital or clinic by a specially trained health care professional. Talk to your pediatrician regarding the use of this medicine in children. Special care may be needed. Overdosage: If you think you have taken too much of this medicine contact a poison control center or emergency room at once. NOTE: This medicine is only for you. Do not share this medicine with others. What if I miss a dose? It is important not to miss your dose. Call your doctor or health care professional if you are unable to keep an appointment. What may interact with this medicine? Do not take this medicine with any of the following medications:  live virus vaccines This medicine may also interact with the following medications:  medicines that treat or prevent blood clots like warfarin, enoxaparin, and dalteparin This list may not describe all possible interactions. Give your health care provider a list of all the medicines, herbs, non-prescription drugs, or dietary supplements you use. Also tell them if you smoke, drink alcohol, or use illegal drugs. Some items may interact with your medicine. What should I watch for while using this medicine? Visit your doctor for checks on your progress. This drug may make you feel generally unwell. This is not uncommon, as chemotherapy can affect healthy cells as well as cancer cells. Report any side  effects. Continue your course of treatment even though you feel ill unless your doctor tells you to stop. In some cases, you may be given additional medicines to help with side effects. Follow all directions for their use. Call your doctor or health care professional for advice if you get a fever, chills or sore throat, or other symptoms of a cold or flu. Do not treat yourself. This drug decreases your body's ability to fight infections. Try to avoid being around people who are sick. This medicine may increase your risk to bruise or bleed. Call your doctor or health care professional if you notice any unusual bleeding. Be careful brushing and flossing your teeth or using a toothpick because you may get an infection or bleed more easily. If you have any dental work done, tell your dentist you are receiving this medicine. Avoid taking products that contain aspirin, acetaminophen, ibuprofen, naproxen, or ketoprofen unless instructed by your doctor. These medicines may hide a fever. Do not become pregnant while taking this medicine. Women should inform their doctor if they wish to become pregnant or think they might be pregnant. There is a potential for serious side effects to an unborn child. Talk to your health care professional or pharmacist for more information. Do not  breast-feed an infant while taking this medicine. Men should inform their doctor if they wish to father a child. This medicine may lower sperm counts. Do not treat diarrhea with over the counter products. Contact your doctor if you have diarrhea that lasts more than 2 days or if it is severe and watery. This medicine can make you more sensitive to the sun. Keep out of the sun. If you cannot avoid being in the sun, wear protective clothing and use sunscreen. Do not use sun lamps or tanning beds/booths. What side effects may I notice from receiving this medicine? Side effects that you should report to your doctor or health care professional  as soon as possible:  allergic reactions like skin rash, itching or hives, swelling of the face, lips, or tongue  low blood counts - this medicine may decrease the number of white blood cells, red blood cells and platelets. You may be at increased risk for infections and bleeding.  signs of infection - fever or chills, cough, sore throat, pain or difficulty passing urine  signs of decreased platelets or bleeding - bruising, pinpoint red spots on the skin, black, tarry stools, blood in the urine  signs of decreased red blood cells - unusually weak or tired, fainting spells, lightheadedness  breathing problems  changes in vision  chest pain  mouth sores  nausea and vomiting  pain, swelling, redness at site where injected  pain, tingling, numbness in the hands or feet  redness, swelling, or sores on hands or feet  stomach pain  unusual bleeding Side effects that usually do not require medical attention (report to your doctor or health care professional if they continue or are bothersome):  changes in finger or toe nails  diarrhea  dry or itchy skin  hair loss  headache  loss of appetite  sensitivity of eyes to the light  stomach upset  unusually teary eyes This list may not describe all possible side effects. Call your doctor for medical advice about side effects. You may report side effects to FDA at 1-800-FDA-1088. Where should I keep my medicine? This drug is given in a hospital or clinic and will not be stored at home. NOTE: This sheet is a summary. It may not cover all possible information. If you have questions about this medicine, talk to your doctor, pharmacist, or health care provider.  2021 Elsevier/Gold Standard (2019-11-04 15:00:03)   PICC Insertion, Care After This sheet gives you information about how to care for yourself after your procedure. Your health care provider may also give you more specific instructions. If you have problems or  questions, contact your health care provider. What can I expect after the procedure? After your procedure, it is common to have mild discomfort at the insertion site. Follow these instructions at home: Activity  Rest as told by your health care provider. Ask your health care provider when you can return to your normal activities.  If your PICC is near or at the bend of your elbow, avoid activity with repeated motion at the elbow.  Do not lift anything that is heavier than 10 lb (4.5 kg), or the limit that you are told, until your health care provider says that it is safe.  Avoid using a crutch with the arm on the same side as your PICC. You may need to use a walker.  Avoid any activity that requires great effort as told by your health care provider.   Bathing  Do not take baths, swim,  or use a hot tub until your health care provider approves. Ask your health care provider if you can take showers. You may only be allowed to take sponge baths for bathing.  Keep the bandage (dressing) dry until your health care provider says it can be removed.   General instructions  Do not drive for 24 hours if you were given a medicine to help you relax (sedative).  Take over-the-counter and prescription medicines only as told by your health care provider.  Keep all follow-up visits as told by your health care provider. This is important. Contact a health care provider if:  You have a fever or chills.  You have more redness, swelling, or pain around the insertion site.  You have fluid or blood coming from the insertion site.  Your insertion site feels warm to the touch.  You have pus or a bad smell coming from the insertion site.  Your vein feels hard and raised like a "cord" under the skin around the insertion site. Get help right away if:  You have swelling in the arm in which the PICC was inserted.  You have numbness or tingling in your fingers, hand, or arm.  You have a red streak  going up your arm from where the PICC was inserted.  Your PICC cannot be flushed, is hard to flush, or leaks around the insertion site when flushed.  You have pain in your arm, ear, face, or teeth.  Your PICC is accidentally pulled all the way out. If this happens, cover the insertion site with a bandage or gauze dressing. Do not throw the PICC away. Your health care provider will need to check it.  Your PICC was tugged or pulled and has partially come out. Do not push the PICC back in.  You hear a "flushing" sound when the PICC is flushed.  You feel your heart racing or skipping beats.  There is a hole or tear in the PICC.  You have pain in your chest.  You have shortness of breath or difficulty breathing. Summary  After your procedure, it is common to have mild discomfort at the insertion site.  Do not lift anything that is heavier than 10 lb (4.5 kg), or the limit that you are told, until your health care provider says that it is safe.  Flush the PICC as told by your health care provider. Let your health care provider know right away if the PICC is hard to flush or does not flush. Do not use force to flush the PICC.  Check your insertion site every day for signs of infection. This information is not intended to replace advice given to you by your health care provider. Make sure you discuss any questions you have with your health care provider. Document Revised: 04/15/2020 Document Reviewed: 04/15/2020 Elsevier Patient Education  [place].

## 2021-04-25 ENCOUNTER — Other Ambulatory Visit: Payer: Self-pay

## 2021-04-25 ENCOUNTER — Ambulatory Visit
Admission: RE | Admit: 2021-04-25 | Discharge: 2021-04-25 | Disposition: A | Discharge status: Home / Self Care | Payer: BC Managed Care – PPO | Source: Ambulatory Visit | Attending: Radiation Oncology | Admitting: Radiation Oncology

## 2021-04-25 DIAGNOSIS — C211 Malignant neoplasm of anal canal: Secondary | ICD-10-CM | POA: Diagnosis not present

## 2021-04-25 DIAGNOSIS — Z510 Encounter for antineoplastic radiation therapy: Secondary | ICD-10-CM | POA: Diagnosis not present

## 2021-04-26 ENCOUNTER — Ambulatory Visit
Admission: RE | Admit: 2021-04-26 | Discharge: 2021-04-26 | Disposition: A | Discharge status: Home / Self Care | Payer: BC Managed Care – PPO | Source: Ambulatory Visit | Attending: Radiation Oncology | Admitting: Radiation Oncology

## 2021-04-26 DIAGNOSIS — C211 Malignant neoplasm of anal canal: Secondary | ICD-10-CM | POA: Diagnosis not present

## 2021-04-26 DIAGNOSIS — Z510 Encounter for antineoplastic radiation therapy: Secondary | ICD-10-CM | POA: Diagnosis not present

## 2021-04-27 ENCOUNTER — Encounter: Payer: Self-pay | Admitting: Radiation Oncology

## 2021-04-27 ENCOUNTER — Ambulatory Visit
Admission: RE | Admit: 2021-04-27 | Discharge: 2021-04-27 | Disposition: A | Discharge status: Home / Self Care | Payer: BC Managed Care – PPO | Source: Ambulatory Visit | Attending: Radiation Oncology | Admitting: Radiation Oncology

## 2021-04-27 ENCOUNTER — Other Ambulatory Visit: Payer: Self-pay

## 2021-04-27 ENCOUNTER — Ambulatory Visit: Payer: BC Managed Care – PPO

## 2021-04-27 DIAGNOSIS — C211 Malignant neoplasm of anal canal: Secondary | ICD-10-CM | POA: Diagnosis not present

## 2021-04-27 DIAGNOSIS — Z510 Encounter for antineoplastic radiation therapy: Secondary | ICD-10-CM | POA: Diagnosis not present

## 2021-04-28 ENCOUNTER — Encounter: Payer: Self-pay | Admitting: Nurse Practitioner

## 2021-04-28 ENCOUNTER — Ambulatory Visit: Payer: BC Managed Care – PPO

## 2021-04-28 ENCOUNTER — Other Ambulatory Visit: Payer: Self-pay | Admitting: Radiation Oncology

## 2021-04-28 ENCOUNTER — Inpatient Hospital Stay (HOSPITAL_BASED_OUTPATIENT_CLINIC_OR_DEPARTMENT_OTHER): Payer: BC Managed Care – PPO | Admitting: Nurse Practitioner

## 2021-04-28 ENCOUNTER — Encounter: Payer: Self-pay | Admitting: Radiation Oncology

## 2021-04-28 ENCOUNTER — Inpatient Hospital Stay: Payer: BC Managed Care – PPO

## 2021-04-28 VITALS — BP 106/73 | HR 89 | Temp 97.8°F | Resp 19 | Ht 67.0 in | Wt 156.6 lb

## 2021-04-28 DIAGNOSIS — L98499 Non-pressure chronic ulcer of skin of other sites with unspecified severity: Secondary | ICD-10-CM | POA: Diagnosis not present

## 2021-04-28 DIAGNOSIS — Z720 Tobacco use: Secondary | ICD-10-CM | POA: Diagnosis not present

## 2021-04-28 DIAGNOSIS — C210 Malignant neoplasm of anus, unspecified: Secondary | ICD-10-CM | POA: Diagnosis not present

## 2021-04-28 DIAGNOSIS — C211 Malignant neoplasm of anal canal: Secondary | ICD-10-CM | POA: Diagnosis not present

## 2021-04-28 DIAGNOSIS — Z79899 Other long term (current) drug therapy: Secondary | ICD-10-CM | POA: Diagnosis not present

## 2021-04-28 DIAGNOSIS — Z5111 Encounter for antineoplastic chemotherapy: Secondary | ICD-10-CM | POA: Diagnosis not present

## 2021-04-28 DIAGNOSIS — Z7289 Other problems related to lifestyle: Secondary | ICD-10-CM | POA: Diagnosis not present

## 2021-04-28 DIAGNOSIS — L309 Dermatitis, unspecified: Secondary | ICD-10-CM | POA: Diagnosis not present

## 2021-04-28 DIAGNOSIS — D696 Thrombocytopenia, unspecified: Secondary | ICD-10-CM | POA: Diagnosis not present

## 2021-04-28 LAB — CBC WITH DIFFERENTIAL (CANCER CENTER ONLY)
Abs Immature Granulocytes: 0.04 10*3/uL (ref 0.00–0.07)
Basophils Absolute: 0 10*3/uL (ref 0.0–0.1)
Basophils Relative: 0 %
Eosinophils Absolute: 0.1 10*3/uL (ref 0.0–0.5)
Eosinophils Relative: 2 %
HCT: 35.7 % — ABNORMAL LOW (ref 36.0–46.0)
Hemoglobin: 12 g/dL (ref 12.0–15.0)
Immature Granulocytes: 1 %
Lymphocytes Relative: 6 %
Lymphs Abs: 0.3 10*3/uL — ABNORMAL LOW (ref 0.7–4.0)
MCH: 33.5 pg (ref 26.0–34.0)
MCHC: 33.6 g/dL (ref 30.0–36.0)
MCV: 99.7 fL (ref 80.0–100.0)
Monocytes Absolute: 0.6 10*3/uL (ref 0.1–1.0)
Monocytes Relative: 11 %
Neutro Abs: 4.3 10*3/uL (ref 1.7–7.7)
Neutrophils Relative %: 80 %
Platelet Count: 137 10*3/uL — ABNORMAL LOW (ref 150–400)
RBC: 3.58 MIL/uL — ABNORMAL LOW (ref 3.87–5.11)
RDW: 13.6 % (ref 11.5–15.5)
WBC Count: 5.4 10*3/uL (ref 4.0–10.5)
nRBC: 0 % (ref 0.0–0.2)

## 2021-04-28 LAB — BASIC METABOLIC PANEL - CANCER CENTER ONLY
Anion gap: 8 (ref 5–15)
BUN: 9 mg/dL (ref 6–20)
CO2: 25 mmol/L (ref 22–32)
Calcium: 9.1 mg/dL (ref 8.9–10.3)
Chloride: 105 mmol/L (ref 98–111)
Creatinine: 0.57 mg/dL (ref 0.44–1.00)
GFR, Estimated: >60 mL/min (ref >60)
Glucose, Bld: 97 mg/dL (ref 70–99)
Potassium: 3.6 mmol/L (ref 3.5–5.1)
Sodium: 138 mmol/L (ref 135–145)

## 2021-04-28 MED ORDER — PHENAZOPYRIDINE HCL 200 MG PO TABS: 200.0000 mg | ORAL_TABLET | Freq: Three times a day (TID) | ORAL | 1 refills | Status: DC | PRN

## 2021-04-28 NOTE — Progress Notes (Signed)
[facility] OFFICE PROGRESS NOTE   Diagnosis: Anal cancer  INTERVAL HISTORY:   Beth [name] returns as scheduled.  She completed cycle two 5-FU/Mitomycin-C beginning 04/18/2021.  She completed the course of radiation yesterday.  She had nausea after the chemotherapy.  Feels better today.  No mouth sores.  She is having small frequent bowel movements.  She takes Imodium if needed.  No bleeding with bowel movements.  She notes incomplete bladder emptying.  Objective:  Vital signs in last 24 hours:  Blood pressure 106/73, pulse 89, temperature 97.8 F (36.6 C), temperature source Oral, resp. rate 19, height 5\' 7"  (1.702 m), weight 156 lb 9.6 oz (71 kg), last menstrual period 03/13/2012, SpO2 100 %.    HEENT: No thrush or ulcers. Resp: Lungs clear bilaterally. Cardio: Regular rate and rhythm. GI: Abdomen soft and nontender.  No hepatomegaly. Vascular: No leg edema. Neuro: Alert and oriented. Skin: Erythema and superficial ulceration at the perineum/perianal region.   Lab Results:  Lab Results  Component Value Date   WBC 5.4 04/28/2021   HGB 12.0 04/28/2021   HCT 35.7 (L) 04/28/2021   MCV 99.7 04/28/2021   PLT 137 (L) 04/28/2021   NEUTROABS 4.3 04/28/2021    Imaging:  No results found.  Medications: I have reviewed the patient's current medications.  Assessment/Plan: 1. Squamous cell carcinoma of the anal canal ? Colonoscopy 01/28/2021-rectal mass extending to the "outside ", palpable on exam-biopsy revealed high-grade squamous intraepithelial lesion (AIN 3/carcinoma in situ) ? 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 CTs 03/07/2021-ill-definition of tissue planes along the anus with abnormal soft tissue prominence posteriorly along the anal canal suspicious for mass.  Small adjacent lower perirectal lymph nodes in addition to a 0.5 cm sacral node.  Focal enhancing lesion in the right hepatic lobe 1.2 x 0.8 x 0.9 cm.  2.1 x 1.5 cm cystic lesion right ovary with thin enhancing rim. ? PET scan 03/16/2021-hypermetabolic mass within the anal canal.  No definite evidence of local or distant metastatic disease.  7 mm left submandibular lymph node with mild FDG uptake, likely reactive.  Focal site of FDG uptake within the inner quadrant of the right breast. ? Radiation 03/21/2021-04/27/2021 ? Cycle one 5-FU/mitomycin-C 03/21/2021 ? Cycle two 5-FU/Mitomycin-C 04/18/2021, 5-FU dose reduced secondary to mucositis 2. Remote history of an anal fissure repair 3. Tobacco use 4. Moderate alcohol use 5. Eczema 6. Right breast mass- PET scan 03/16/2021 with focal FDG activity in the inner right breast  Mammogram and right breast ultrasound 03/28/2021- negative  Bilateral breast MRI 03/31/2021- 7 mm mass in the upper inner right breast correlating with the PET findings, no other abnormality, no adenopathy  MRI guided biopsy of right breast mass 04/11/2021- fibrocystic change with sclerosing adenosis, small focus of inflammation/fibrosis suggestive of resolving fat necrosis, no evidence of malignancy--repeat bilateral breast MRI at a 39-month interval   Disposition: Beth [name] appears stable.  She completed cycle two 5-FU/Mitomycin-C beginning 04/18/2021.  She completed the course of radiation yesterday.  She will continue symptomatic treatment of the perianal/perineal skin ulceration.  She has oxycodone to take as needed.  The skin toxicity should improve  over the next few weeks.  We reviewed the CBC and basic metabolic panel from today.  She has mild thrombocytopenia,  counts otherwise stable.  She will return for lab and follow-up in approximately 4 weeks.  We are available to see her sooner if needed.    Ned Card ANP/GNP-BC   04/28/2021  11:03 AM

## 2021-04-29 ENCOUNTER — Ambulatory Visit: Payer: BC Managed Care – PPO

## 2021-04-29 ENCOUNTER — Telehealth: Payer: Self-pay | Admitting: *Deleted

## 2021-04-29 NOTE — Telephone Encounter (Signed)
Returned call to the patient regarding prescription sent in yesterday.  This medication was not covered by her insurance and she requests change in medication.  After discussion with Dr. Lisbeth Renshaw and she was advised to use over the counter AZO, take 2 tablets TID.  She verbalized understanding of new instructions.    Gloriajean Dell. Leonie Green, BSN

## 2021-05-05 NOTE — Progress Notes (Signed)
  Patient Name: Beth [name] MRN: [id number] DOB: 25-Sep-1971 Referring Physician: [name] (Profile Not Attached) Date of Service: 04/27/2021 [facility], [place]                                                        End Of Treatment Note  Diagnoses: C21.1-Malignant neoplasm of anal canal  Cancer Staging: Squamous cell carcinoma of the anus  Intent: Curative  Radiation Treatment Dates: 03/21/2021 through 04/27/2021 Site Technique Total Dose (Gy) Dose per Fx (Gy) Completed Fx Beam Energies  Anus: Anus IMRT 50.4/50.4 1.8 28/28 6X   Narrative: The patient tolerated radiation therapy relatively well. She had to undergo workup of a right breast mass in the midst of treatment found on staging PET for her anal carcinoma. Fortunately this was biopsied and a benign finding. She did develop   Plan: The patient will receive a call in about one month from the radiation oncology department at which time we will revisit the need to have a referral to PT for pelvic floor rehabilitation evaluation and discuss dilator. She will continue follow up with Dr. Benay Spice as well.   ________________________________________________    Carola Rhine, [facility]

## 2021-05-10 ENCOUNTER — Telehealth: Payer: Self-pay | Admitting: Radiation Oncology

## 2021-05-10 NOTE — Telephone Encounter (Signed)
I called the patient to check on how she was doing and left a voicemail encouraging her to call us back if she'd like to discuss her progress.

## 2021-05-13 ENCOUNTER — Other Ambulatory Visit: Payer: Self-pay | Admitting: Nurse Practitioner

## 2021-05-13 DIAGNOSIS — C210 Malignant neoplasm of anus, unspecified: Secondary | ICD-10-CM

## 2021-05-13 MED ORDER — OXYCODONE HCL 5 MG PO TABS: 5.0000 mg | ORAL_TABLET | Freq: Four times a day (QID) | ORAL | 0 refills | Status: DC | PRN

## 2021-05-14 ENCOUNTER — Other Ambulatory Visit: Payer: Self-pay | Admitting: Hematology and Oncology

## 2021-05-14 DIAGNOSIS — C210 Malignant neoplasm of anus, unspecified: Secondary | ICD-10-CM

## 2021-05-14 MED ORDER — OXYCODONE HCL 5 MG PO TABS: 5.0000 mg | ORAL_TABLET | Freq: Four times a day (QID) | ORAL | 0 refills | Status: DC | PRN

## 2021-05-25 DIAGNOSIS — D225 Melanocytic nevi of trunk: Secondary | ICD-10-CM | POA: Diagnosis not present

## 2021-05-25 DIAGNOSIS — L570 Actinic keratosis: Secondary | ICD-10-CM | POA: Diagnosis not present

## 2021-05-25 DIAGNOSIS — D485 Neoplasm of uncertain behavior of skin: Secondary | ICD-10-CM | POA: Diagnosis not present

## 2021-05-25 DIAGNOSIS — Z1283 Encounter for screening for malignant neoplasm of skin: Secondary | ICD-10-CM | POA: Diagnosis not present

## 2021-05-30 ENCOUNTER — Inpatient Hospital Stay (HOSPITAL_BASED_OUTPATIENT_CLINIC_OR_DEPARTMENT_OTHER): Payer: BC Managed Care – PPO | Admitting: Oncology

## 2021-05-30 ENCOUNTER — Other Ambulatory Visit: Payer: Self-pay

## 2021-05-30 ENCOUNTER — Inpatient Hospital Stay: Payer: BC Managed Care – PPO | Attending: Nurse Practitioner

## 2021-05-30 VITALS — BP 120/77 | HR 96 | Temp 97.8°F | Resp 20 | Ht 67.0 in | Wt 153.2 lb

## 2021-05-30 DIAGNOSIS — C210 Malignant neoplasm of anus, unspecified: Secondary | ICD-10-CM

## 2021-05-30 DIAGNOSIS — C211 Malignant neoplasm of anal canal: Secondary | ICD-10-CM | POA: Insufficient documentation

## 2021-05-30 LAB — CBC WITH DIFFERENTIAL (CANCER CENTER ONLY)
Abs Immature Granulocytes: 0.05 10*3/uL (ref 0.00–0.07)
Basophils Absolute: 0 10*3/uL (ref 0.0–0.1)
Basophils Relative: 0 %
Eosinophils Absolute: 0.4 10*3/uL (ref 0.0–0.5)
Eosinophils Relative: 7 %
HCT: 36.3 % (ref 36.0–46.0)
Hemoglobin: 12 g/dL (ref 12.0–15.0)
Immature Granulocytes: 1 %
Lymphocytes Relative: 11 %
Lymphs Abs: 0.6 10*3/uL — ABNORMAL LOW (ref 0.7–4.0)
MCH: 34.8 pg — ABNORMAL HIGH (ref 26.0–34.0)
MCHC: 33.1 g/dL (ref 30.0–36.0)
MCV: 105.2 fL — ABNORMAL HIGH (ref 80.0–100.0)
Monocytes Absolute: 0.6 10*3/uL (ref 0.1–1.0)
Monocytes Relative: 11 %
Neutro Abs: 3.8 10*3/uL (ref 1.7–7.7)
Neutrophils Relative %: 70 %
Platelet Count: 149 10*3/uL — ABNORMAL LOW (ref 150–400)
RBC: 3.45 MIL/uL — ABNORMAL LOW (ref 3.87–5.11)
RDW: 17.9 % — ABNORMAL HIGH (ref 11.5–15.5)
WBC Count: 5.4 10*3/uL (ref 4.0–10.5)
nRBC: 0 % (ref 0.0–0.2)

## 2021-05-30 NOTE — Progress Notes (Signed)
[facility] OFFICE VISIT PROGRESS NOTE  I connected with Hallee Mckenny on 05/30/21 at 10:00 AM EDT by video enabled telemedicine visit and verified that I am speaking with the correct person using two identifiers.   I discussed the limitations, risks, security and privacy concerns of performing an evaluation and management service by telemedicine and the availability of in-person appointments. I also discussed with the patient that there may be a patient responsible charge related to this service. The patient expressed understanding and agreed to proceed.   Patient's location: Office Provider's location: Home   Diagnosis: Anal cancer  INTERVAL HISTORY:  Ms. Crenshaw returns for a scheduled visit.  She completed radiation on 04/27/2021.  She reports skin changes at the groin and perineum have improved.  She continues to have pain with bowel movements.  She feels as though his something is protruding from the anus.  She complains of malaise.  She takes oxycodone "occasionally ".  Objective:  Vital signs in last 24 hours:  Blood pressure 120/77, pulse 96, temperature 97.8 F (36.6 C), temperature source Oral, resp. rate 20, height 5\' 7"  (1.702 m), weight 153 lb 3.2 oz (69.5 kg), last menstrual period 03/13/2012, SpO2 100 %.    GI: Abdomen soft and nontender. Small soft external hemorrhoid, not thrombosed. DRE attempted, not completed due to discomfort.  No obvious abnormality with limited exam. Skin: Perianal skin/perineum hyperpigmented consistent with prior radiation.  No erythema or skin breakdown.    Lab Results:  Lab Results  Component Value Date   WBC 5.4 05/30/2021   HGB 12.0 05/30/2021   HCT 36.3 05/30/2021   MCV 105.2 (H) 05/30/2021   PLT 149 (L) 05/30/2021   NEUTROABS 3.8 05/30/2021     Medications: I have reviewed the patient's current medications.  Assessment/Plan: Squamous cell carcinoma of the anal  canal Colonoscopy 01/28/2021-rectal mass extending to the "outside ", palpable on exam-biopsy revealed high-grade squamous intraepithelial lesion (AIN 3/carcinoma in situ) Exam under anesthesia with transanal excision of an anal canal mass 02/25/2021-frozen section consistent with squamous cell carcinoma, 4 x 4 centimeter sessile anal canal mass extending to the anal verge, right lateral and posterior, 30% circumferential, fixed to the anal sphincter complex; anal rectal mass with mixed high-grade neuroendocrine squamous cell carcinoma; posterior anal tag with squamous cell carcinoma; left labial lesion-condyloma acuminata CTs 03/07/2021-ill-definition of tissue planes along the anus with abnormal soft tissue prominence posteriorly along the anal canal suspicious for mass.  Small adjacent lower perirectal lymph nodes in addition to a 0.5 cm sacral node.  Focal enhancing lesion in the right hepatic lobe 1.2 x 0.8 x 0.9 cm.  2.1 x 1.5 cm cystic lesion right ovary with thin enhancing rim. PET scan 03/16/2021-hypermetabolic mass within the anal canal.  No definite evidence of local or distant metastatic disease.  7 mm left submandibular lymph node with mild FDG uptake, likely reactive.  Focal site of FDG uptake within the inner quadrant of the right breast. Radiation 03/21/2021-04/27/2021 Cycle one 5-FU/mitomycin-C 03/21/2021 Cycle two 5-FU/Mitomycin-C 04/18/2021, 5-FU dose reduced secondary to mucositis Remote history of an anal fissure repair Tobacco use Moderate alcohol use Eczema Right breast mass- PET scan 03/16/2021 with focal FDG activity in the inner right breast Mammogram and right breast ultrasound 03/28/2021- negative Bilateral breast MRI 03/31/2021- 7 mm mass in the upper inner right breast correlating with the PET findings, no other abnormality, no adenopathy MRI guided biopsy of right breast mass 04/11/2021- fibrocystic change with  sclerosing adenosis, small focus of inflammation/fibrosis suggestive of  resolving fat necrosis, no evidence of malignancy--repeat bilateral breast MRI at a 29-month interval   Disposition: Ms. Haislip has completed the course of chemotherapy and radiation for treatment of anal cancer.  She is scheduled to see Dr. Johney Maine for an anal examination in August.  She will return for an office visit here in 3 months.  Hopefully the rectal symptoms will resolve over the next several weeks.  We did not refill her prescription for oxycodone.   I discussed the assessment and treatment plan with the patient. The patient was provided an opportunity to ask questions and all were answered. The patient agreed with the plan and demonstrated an understanding of the instructions.   The patient was advised to call back or seek an in-person evaluation if the symptoms worsen or if the condition fails to improve as anticipated. I provided 25 minutes of video, chart review, and documentation time during this encounter, and > 50% was spent counseling as documented under my assessment & plan.  [name]  05/30/2021 10:06 AM   Skin at the perineum/perianal region is intact, changes consistent with prior radiation.  There is no skin breakdown.  There is a small soft external hemorrhoid, does not appear thrombosed.  She will try over-the-counter hemorrhoid treatment.  If symptoms persist (she describes "feeling something" when she wipes after a bowel movement) we will reevaluate sooner than the 69-month office visit.  Ned Card NP

## 2021-05-31 ENCOUNTER — Telehealth: Payer: Self-pay

## 2021-05-31 NOTE — Telephone Encounter (Signed)
Spoke to pt and pt acct # phone number given to pt to call to request copy of bill

## 2021-05-31 NOTE — Telephone Encounter (Signed)
Patient is calling because she had a procedure 01/28/2021 and she wants a copy of the surgeon bill.  Please call to discuss this with the patient

## 2021-06-16 NOTE — Progress Notes (Signed)
  Radiation Oncology         [phone] ________________________________  Name: Beth [name] MRN: [id number]  Date of Service: 06/27/2021  DOB: [birth year]  Post Treatment Telephone Note  Diagnosis:   Squamous cell carcinoma of the anus  Interval Since Last Radiation:  9 weeks   03/21/2021 through 04/27/2021 Site Technique Total Dose (Gy) Dose per Fx (Gy) Completed Fx Beam Energies  Anus: Anus IMRT 50.4/50.4 1.8 28/28 6X    Narrative:  The patient was contacted today for routine follow-up. During treatment she did very well with radiotherapy and did not have significant desquamation.    Impression/Plan: 1. Squamous cell carcinoma of the anus. The patient was unavailable to take my call. On the message I left, I discussed that we would be happy to continue to follow her as needed, but she will also continue to follow up with Dr. Benay Spice in medical oncology and Dr. Johney Maine in colorectal surgery. She was counseled on referral to pelvic floor PT. She has dilators to use as well which were provided to her prior to treatment and I encouraged her to call me back so we could discuss in more detail.      Carola Rhine, PAC

## 2021-06-27 ENCOUNTER — Ambulatory Visit
Admission: RE | Admit: 2021-06-27 | Discharge: 2021-06-27 | Disposition: A | Discharge status: Home / Self Care | Payer: BC Managed Care – PPO | Source: Ambulatory Visit | Attending: Radiation Oncology | Admitting: Radiation Oncology

## 2021-06-27 DIAGNOSIS — C211 Malignant neoplasm of anal canal: Secondary | ICD-10-CM | POA: Insufficient documentation

## 2021-07-04 ENCOUNTER — Telehealth: Payer: Self-pay | Admitting: Radiation Oncology

## 2021-07-04 DIAGNOSIS — C211 Malignant neoplasm of anal canal: Secondary | ICD-10-CM

## 2021-07-04 NOTE — Addendum Note (Signed)
Addended by: Carola Rhine on: 07/04/2021 02:07 PM   Modules accepted: Orders

## 2021-07-04 NOTE — Telephone Encounter (Signed)
I spoke with the patient and she is scheduled in August with Dr. Johney Maine and with Lattie Haw and Dr. Benay Spice in September. We reviewed the need for use of vaginal dilators and to meet with Earlie Counts in PT. She is in agreement but states she did not receive a kit of vaginal dilators. We will put together a bag of different sized dilators for her to try at our front desk to pick up tomorrow.

## 2021-07-04 NOTE — Telephone Encounter (Signed)
I returned the patient's call and had to leave a message asking her to call me back at her convenience.

## 2021-07-06 ENCOUNTER — Other Ambulatory Visit: Payer: Self-pay

## 2021-07-06 ENCOUNTER — Ambulatory Visit: Payer: BC Managed Care – PPO | Attending: Radiation Oncology | Admitting: Physical Therapy

## 2021-07-06 DIAGNOSIS — R252 Cramp and spasm: Secondary | ICD-10-CM | POA: Diagnosis not present

## 2021-07-06 DIAGNOSIS — M6281 Muscle weakness (generalized): Secondary | ICD-10-CM | POA: Diagnosis not present

## 2021-07-06 NOTE — Patient Instructions (Signed)
PROTOCOL FOR VAGINAL DILATORS   Wash dilator with soap and water prior to insertion.    Lay on your back reclined. Knees are to be up and apart while on your bed or in the bathtub with warm water.   Lubricate the end of the dilator with a water-soluble lubricant.  Separate the labia.   Tense the pelvic floor muscles than relax; while relaxing, slide lubricated dilator( round side of dilator) into the vagina.  Insert toward the direction of your spine. Dilator should feel snug and no pain more than 3/10.   Tense muscles again while holding the dilator so it does not get pushed out; relax and slide it in a little further.   Try blowing out as if filling a balloon; this may relax the muscles and allow penetration.  Repeat blowing out to insert dilator further.  Keep dilator in for 10 minutes if tolerate, with the pelvic floor muscles relaxed to further stretch the canal.   Never force the dilator into the canal. Once the dilator is comfortable start to move in and out, side to side, move your hips in different directions  3-4 times per week Progression of dilator.  When you are able to place dilator into vaginal canal and feel no pain or able to move without difficulty you are ready for the next size.  Before you go to the next size start with the original size for 2 minutes then use the next size up for 5 minutes. When the next size up is easy to use, do not have to start with the smaller size.  STRETCHING THE PELVIC FLOOR MUSCLES NO DILATOR  Supplies Vaginal lubricant Mirror (optional) Gloves (optional) or clean hands Positioning Start in a semi-reclined position with your head propped up. Bend your knees and place your thumb or finger at the vaginal opening. Procedure Apply a moderate amount of lubricant on the outer skin of your vagina, the labia minora.  Apply additional lubricant to your finger. Spread the skin away from the vaginal opening. Place the end of your finger at the  opening. Do a maximum contraction of the pelvic floor muscles. Tighten the vagina and the anus maximally and relax. When you know they are relaxed, gently and slowly insert your finger into your vagina, directing your finger slightly downward, for 2-3 inches of insertion. Relax and stretch the 6 o'clock position Hold each stretch for _30-60 seconds, no pain more than 3/10 Repeat the stretching in the 4 o'clock and 8 o'clock positions. Next gently move your finger in a "U" shape  several times.  You can also enter a second finger to work to spread the vaginal opening wider from 3:00-6:00 and 6:00-9:00 or 3:00-9:00 Perform daily or every other day Once you have accomplished the techniques you may try them in standing with one foot resting on the tub, or in other positions.  This is a good stretch to do in the shower if you don't need to use lubricant.  This can be done at 35 weeks or later in your pregnancy.  Trigger Point Dry Needling  What is Trigger Point Dry Needling (DN)? DN is a physical therapy technique used to treat muscle pain and dysfunction. Specifically, DN helps deactivate muscle trigger points (muscle knots).  A thin filiform needle is used to penetrate the skin and stimulate the underlying trigger point. The goal is for a local twitch response (LTR) to occur and for the trigger point to relax. No medication of any kind  is injected during the procedure.   What Does Trigger Point Dry Needling Feel Like?  The procedure feels different for each individual patient. Some patients report that they do not actually feel the needle enter the skin and overall the process is not painful. Very mild bleeding may occur. However, many patients feel a deep cramping in the muscle in which the needle was inserted. This is the local twitch response.   How Will I feel after the treatment? Soreness is normal, and the onset of soreness may not occur for a few hours. Typically this soreness does not last  longer than two days.  Bruising is uncommon, however; ice can be used to decrease any possible bruising.  In rare cases feeling tired or nauseous after the treatment is normal. In addition, your symptoms may get worse before they get better, this period will typically not last longer than 24 hours.   What Can I do After My Treatment? Increase your hydration by drinking more water for the next 24 hours. You may place ice or heat on the areas treated that have become sore, however, do not use heat on inflamed or bruised areas. Heat often brings more relief post needling. You can continue your regular activities, but vigorous activity is not recommended initially after the treatment for 24 hours. DN is best combined with other physical therapy such as strengthening, stretching, and other therapies.    [facility] [address] [place], [place] [address] Phone # [phone] Fax [phone]

## 2021-07-06 NOTE — Therapy (Signed)
[facility] [address], [place], [place], [address] Phone: [phone]   Fax:  [phone]  Physical Therapy Evaluation  Patient Details  Name: Beth [name] MRN: [id number] Date of Birth: 06-21-1971 Referring Provider (PT): Hayden Pedro, [place]   Encounter Date: 07/06/2021   PT End of Session - 07/06/21 1527     Visit Number 1    Date for PT Re-Evaluation 09/28/21    Authorization Type BC[employer]    PT Start Time 1527    PT Stop Time 1613    PT Time Calculation (min) 46 min    Activity Tolerance Patient tolerated treatment well    Behavior During Therapy [facility] for tasks assessed/performed             Past Medical History:  Diagnosis Date   Acne    on face spirolactone for   Anal cancer ([place]) 02/25/2021   Constipation, chronic 08/08/2016   History of migraine    none in years   Wears glasses     Past Surgical History:  Procedure Laterality Date   ABDOMINAL HYSTERECTOMY  2016   partial   COLONOSCOPY     COLONOSCOPY WITH PROPOFOL N/A 01/28/2021   Procedure: COLONOSCOPY WITH PROPOFOL;  Surgeon: Virgel Manifold, MD;  Location: [facility];  Service: Endoscopy;  Laterality: N/A;   EXCISION HYDRADENITIS LABIA Left 02/25/2021   Procedure: EXCISION of  LABIAL LESIOM;  Surgeon: Michael Boston, MD;  Location: [facility];  Service: General;  Laterality: Left;   IR FLUORO GUIDE CV LINE RIGHT  04/18/2021   RECTAL EXAM UNDER ANESTHESIA N/A 02/25/2021   Procedure: ANORECTAL EXAM UNDER ANESTHESIA;  Surgeon: Michael Boston, MD;  Location: [facility];  Service: General;  Laterality: N/A;   SPHINCTEROTOMY N/A 08/30/2016   Procedure: LATERAL INTERNAL AND SPHINCTEROTOMY;  Surgeon: Michael Boston, MD;  Location: [facility];  Service: General;  Laterality: N/A;   TONSILLECTOMY  as child   TRANSANAL EXCISION OF RECTAL MASS N/A 02/25/2021   Procedure: TRANSANAL EXCISIONAL  BIOPSY OF ANAL CANAL  MASS;  Surgeon: Michael Boston, MD;  Location: [facility];  Service: General;  Laterality: N/A;    There were no vitals filed for this visit.    Subjective Assessment - 07/06/21 1535     Subjective Pt has a little leakage after wiping and still burning with voiding and with bowel movements.  Pt feels like BMs are getting back to normal but still having diarrhea 1x/ week.  Burning happens while going to the bathroom and during intercourse    Currently in Pain? Yes    Pain Score 4     Pain Location Buttocks    Pain Orientation Posterior    Pain Descriptors / Indicators Burning;Sore    Pain Radiating Towards posterior thigh    Aggravating Factors  the morning getting out of bed is the worst    Pain Relieving Factors moving around    Multiple Pain Sites No                [facility] PT Assessment - 07/07/21 0001       Assessment   Medical Diagnosis C21.1 (ICD-10-CM) - Squamous cell carcinoma of anal canal [facility])    Referring Provider (PT) Hayden Pedro, PA-C    Prior Therapy No      Precautions   Precautions None      Restrictions   Weight Bearing Restrictions No  Balance Screen   Has the patient fallen in the past 6 months No      [place] residence    Living Arrangements Spouse/significant other      Prior Function   Level of Independence Independent    Vocation Full time employment    Engineer, manufacturing job      Cognition   Overall Cognitive Status Within Functional Limits for tasks assessed      Functional Tests   Functional tests Single leg stance      Single Leg Stance   Comments normal      Posture/Postural Control   Posture/Postural Control Postural limitations    Postural Limitations Forward head      PROM   Overall PROM Comments hip flexion 50% bil      Strength   Overall Strength Comments 4/5 hip MMT throughout      Flexibility   Soft Tissue Assessment /Muscle Length yes     Hamstrings 60% with anterior hip pain      Palpation   Palpation comment bilat lumbar and gluteals tight      Ambulation/Gait   Gait Pattern Within Functional Limits                        Objective measurements completed on examination: See above findings.     Pelvic Floor Special Questions - 07/07/21 0001     Prior Pregnancies No    Currently Sexually Active Yes    Is this Painful Yes    Marinoff Scale pain prevents any attempts at intercourse    Urinary Leakage No    Urinary urgency Yes   sometimes a little leakage   Urinary frequency normal    Fecal incontinence --   just a little after wiping   Fluid intake 16 oz during day; 16 oz at night    Caffeine beverages coffee    Skin Integrity --   dryness   Prolapse None    Pelvic Floor Internal Exam identity confirmed and informed consent given to perform internal soft tissue assessment    Exam Type Vaginal    Palpation TTP throughout with very light pressure, tolerates one finger diameter without pain, not TTP to external palpations    Strength Flicker    Strength # of reps 1    Strength # of seconds 1    Tone high              [facility] Adult PT Treatment/Exercise - 07/07/21 0001       Self-Care   Other Self-Care Comments  dilator and nondilator stretching techniques; dry needling info                      PT Short Term Goals - 07/07/21 0939       PT SHORT TERM GOAL #1   Title ind with initial HEP    Time 4    Period Weeks    Status New    Target Date 08/03/21               PT Long Term Goals - 07/06/21 1621       PT LONG TERM GOAL #1   Title Pt will be ind with HEP for pelvic health    Time 12    Period Weeks    Status New    Target Date 09/28/21      PT LONG TERM  GOAL #2   Title Pt will report she is able to progress dilator use on her own    Time 9    Period Weeks    Status New    Target Date 09/28/21      PT LONG TERM GOAL #3   Title Pt will report at  least 60% less pain with bowel and bladder emptying    Time 12    Period Weeks    Status New    Target Date 09/28/21      PT LONG TERM GOAL #4   Title Pt will report at least 50% less pain in her legs and glutes so she can start a walking routine for general health    Time 12    Period Weeks    Status New    Target Date 09/28/21                    Plan - 07/06/21 1716     Clinical Impression Statement Pt presents to skilled PT s/p anal cancer treatments.  Pt has been having very minimal leakage, but pain is more of her concerned with pain during toileting and intercourse as well as hip and leg pain.  Pt already has dilators that she brought and wants to learn how to reduce pain and how to use dilators.  Education for starting dilator use at home was provided today.  Other findings of hip weakness, h/s tight with anterior hip pain end range.  Pelvic floor has high tone and 1/5 MMT.  She is tight and fascia restricted throughout pelvic floor. Ishiocavernosis muscle tension on Rt side more than Lt.  Pt will benefit from skilled PT to address impairments and return to full function without pain.    Personal Factors and Comorbidities Comorbidity 3+    Comorbidities hx of hysterectomy partial 2016; anal cancer 02/25/2021; history of migrain; radiation ended 04/27/21    Examination-Activity Limitations Toileting    Examination-Participation Restrictions Community Activity;Interpersonal Relationship    Stability/Clinical Decision Making Evolving/Moderate complexity    Clinical Decision Making Moderate    Rehab Potential Excellent    PT Frequency 1x / week    PT Duration 12 weeks    PT Treatment/Interventions ADLs/Self Care Home Management;Biofeedback;Cryotherapy;Electrical Stimulation;Moist Heat;Therapeutic activities;Neuromuscular re-education;Therapeutic exercise;Patient/family education;Manual techniques;Passive range of motion;Dry needling    PT Next Visit Plan internal STM/review  dilators if needed; lumbar and hip stretches    Consulted and Agree with Plan of Care Patient             Patient will benefit from skilled therapeutic intervention in order to improve the following deficits and impairments:  Decreased endurance, Decreased range of motion, Increased fascial restricitons, Pain, Decreased coordination, Increased muscle spasms, Decreased strength  Visit Diagnosis: Cramp and spasm  Muscle weakness (generalized)     Problem List Patient Active Problem List   Diagnosis Date Noted   Anal squamous cell carcinoma ([place]) 03/01/2021   Squamous cell carcinoma of anal canal ([place]) 02/25/2021   History of migraine    Encounter for screening colonoscopy    Rectal polyp    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 08/30/2016 10/07/2016   Tobacco abuse 08/08/2016   Chronic anal fissure s/p partial sphincterotomy 08/30/2016 08/08/2016   Constipation, chronic 08/08/2016    Camillo Flaming Norm Wray, PT 07/07/2021, 10:05 AM  [facility] [address] [place], [place], [address] Phone: [phone]   Fax:  [phone]  Name: Beth [name] MRN: [id number] Date of Birth: Apr 03, 1971

## 2021-07-20 DIAGNOSIS — L290 Pruritus ani: Secondary | ICD-10-CM | POA: Diagnosis not present

## 2021-07-20 DIAGNOSIS — C210 Malignant neoplasm of anus, unspecified: Secondary | ICD-10-CM | POA: Diagnosis not present

## 2021-08-04 ENCOUNTER — Ambulatory Visit: Payer: BC Managed Care – PPO | Admitting: Physical Therapy

## 2021-08-10 ENCOUNTER — Ambulatory Visit: Payer: BC Managed Care – PPO | Attending: Radiation Oncology | Admitting: Physical Therapy

## 2021-08-10 ENCOUNTER — Encounter: Payer: Self-pay | Admitting: Physical Therapy

## 2021-08-10 ENCOUNTER — Other Ambulatory Visit: Payer: Self-pay

## 2021-08-10 DIAGNOSIS — R252 Cramp and spasm: Secondary | ICD-10-CM | POA: Insufficient documentation

## 2021-08-10 DIAGNOSIS — M6281 Muscle weakness (generalized): Secondary | ICD-10-CM | POA: Diagnosis not present

## 2021-08-10 NOTE — Therapy (Signed)
[facility] [address], [place], [place], [address] Phone: [phone]   Fax:  [phone]  Physical Therapy Treatment  Patient Details  Name: Beth [name] MRN: [id number] Date of Birth: [birth year] Referring Provider (PT): Hayden Pedro, [place]   Encounter Date: 08/10/2021   PT End of Session - 08/10/21 2117     Visit Number 2    Date for PT Re-Evaluation 09/28/21    Authorization Type BCBS    PT Start Time 1530    PT Stop Time 1611    PT Time Calculation (min) 41 min    Activity Tolerance Patient tolerated treatment well    Behavior During Therapy [facility] for tasks assessed/performed             Past Medical History:  Diagnosis Date   Acne    on face spirolactone for   Anal cancer ([place]) 02/25/2021   Constipation, chronic 08/08/2016   History of migraine    none in years   Wears glasses     Past Surgical History:  Procedure Laterality Date   ABDOMINAL HYSTERECTOMY  2016   partial   COLONOSCOPY     COLONOSCOPY WITH PROPOFOL N/A 01/28/2021   Procedure: COLONOSCOPY WITH PROPOFOL;  Surgeon: Virgel Manifold, MD;  Location: [facility];  Service: Endoscopy;  Laterality: N/A;   EXCISION HYDRADENITIS LABIA Left 02/25/2021   Procedure: EXCISION of  LABIAL LESIOM;  Surgeon: Michael Boston, MD;  Location: [place];  Service: General;  Laterality: Left;   IR FLUORO GUIDE CV LINE RIGHT  04/18/2021   RECTAL EXAM UNDER ANESTHESIA N/A 02/25/2021   Procedure: ANORECTAL EXAM UNDER ANESTHESIA;  Surgeon: Michael Boston, MD;  Location: [place];  Service: General;  Laterality: N/A;   SPHINCTEROTOMY N/A 08/30/2016   Procedure: LATERAL INTERNAL AND SPHINCTEROTOMY;  Surgeon: Michael Boston, MD;  [facility];  Service: General;  Laterality: N/A;   TONSILLECTOMY  as child   TRANSANAL EXCISION OF RECTAL MASS N/A 02/25/2021   Procedure: TRANSANAL EXCISIONAL  BIOPSY OF ANAL CANAL  MASS;  Surgeon: Michael Boston, MD;  Location: [place];  Service: General;  Laterality: N/A;    There were no vitals filed for this visit.   Subjective Assessment - 08/10/21 1533     Subjective My legs are still sore and that is bothering me the most    Currently in Pain? Yes    Pain Score 4     Pain Location Leg    Pain Orientation Right;Left;Posterior;Proximal    Pain Descriptors / Indicators Sore;Throbbing;Burning    Pain Type Chronic pain    Pain Radiating Towards post thigh    Pain Onset More than a month ago    Pain Frequency Intermittent    Multiple Pain Sites No                               [facility] Adult PT Treatment/Exercise - 08/10/21 0001       Self-Care   Other Self-Care Comments  dilators reviewed and more ways to use were discussed      Exercises   Exercises Lumbar      Lumbar Exercises: Stretches   Other Lumbar Stretch Exercise figure 4, h/s stretch, happy baby, butterfly - 1 min with education and modifications as needed      Manual Therapy   Manual Therapy Internal Pelvic Floor  Manual therapy comments pt identity confirmed and internal with consent    Internal Pelvic Floor bilat levators and vaginal canal                    PT Education - 08/10/21 2120     Education Details Access Code: [email or web address]    Person(s) Educated Patient    Methods Explanation;Demonstration;Tactile cues;Verbal cues;Handout    Comprehension Verbalized understanding;Returned demonstration              PT Short Term Goals - 07/07/21 0939       PT SHORT TERM GOAL #1   Title ind with initial HEP    Time 4    Period Weeks    Status New    Target Date 08/03/21               PT Long Term Goals - 07/06/21 1621       PT LONG TERM GOAL #1   Title Pt will be ind with HEP for pelvic health    Time 12    Period Weeks    Status New    Target Date 09/28/21      PT LONG TERM GOAL #2   Title Pt will report she is able  to progress dilator use on her own    Time 12    Period Weeks    Status New    Target Date 09/28/21      PT LONG TERM GOAL #3   Title Pt will report at least 60% less pain with bowel and bladder emptying    Time 12    Period Weeks    Status New    Target Date 09/28/21      PT LONG TERM GOAL #4   Title Pt will report at least 50% less pain in her legs and glutes so she can start a walking routine for general health    Time 12    Period Weeks    Status New    Target Date 09/28/21                   Plan - 08/10/21 2117     Clinical Impression Statement Pt tolerated first treatment well.  Pt has fascial restrictions in vaginal canal but had release with STM.  Pt was educated on how to use dilatos. Pt will benefit from skilled PT to continue to address pelvic floor and muscle tightness in LE.    PT Treatment/Interventions ADLs/Self Care Home Management;Biofeedback;Cryotherapy;Electrical Stimulation;Moist Heat;Therapeutic activities;Neuromuscular re-education;Therapeutic exercise;Patient/family education;Manual techniques;Passive range of motion;Dry needling    PT Next Visit Plan internal STM/review dilators if needed; lumbar and hip stretches, discuss maybe dry needling; fascial release to abdomen    PT Home Exercise Plan Access Code: [email or web address]    Consulted and Agree with Plan of Care Patient             Patient will benefit from skilled therapeutic intervention in order to improve the following deficits and impairments:  Decreased endurance, Decreased range of motion, Increased fascial restricitons, Pain, Decreased coordination, Increased muscle spasms, Decreased strength  Visit Diagnosis: Cramp and spasm  Muscle weakness (generalized)     Problem List Patient Active Problem List   Diagnosis Date Noted   Anal squamous cell carcinoma ([place]) 03/01/2021   Squamous cell carcinoma of anal canal ([place]) 02/25/2021   History of migraine    Encounter for screening  colonoscopy    Rectal polyp    Anal intraepithelial  neoplasia III (AIN III) in anal tag s/p excision 08/30/2016 10/07/2016   Tobacco abuse 08/08/2016   Chronic anal fissure s/p partial sphincterotomy 08/30/2016 08/08/2016   Constipation, chronic 08/08/2016    Jule Ser, PT 08/10/2021, 9:24 PM  [facility] [address], [place], [place], [address] Phone: [phone]   Fax:  [phone]  Name: Beth [name] MRN: [id number] Date of Birth: [birth year]

## 2021-08-10 NOTE — Patient Instructions (Signed)
Access Code: [email or web address] URL: [email or web address] Date: 08/10/2021 Prepared by: Jari Favre  Exercises Supine Figure 4 Piriformis Stretch - 1 x daily - 7 x weekly - 1 sets - 3 reps - 30 sec hold Supine Hamstring Stretch with Strap - 1 x daily - 7 x weekly - 1 sets - 3 reps - 30 sec hold Supine Lower Trunk Rotation - 1 x daily - 7 x weekly - 1 sets - 10 reps - 5 sec hold Sidelying Thoracic Rotation with Open Book - 1 x daily - 7 x weekly - 1 sets - 5 reps - 10 sec hold

## 2021-08-15 ENCOUNTER — Ambulatory Visit (INDEPENDENT_AMBULATORY_CARE_PROVIDER_SITE_OTHER): Payer: BC Managed Care – PPO | Admitting: Obstetrics and Gynecology

## 2021-08-15 ENCOUNTER — Other Ambulatory Visit: Payer: Self-pay

## 2021-08-15 ENCOUNTER — Telehealth: Payer: Self-pay

## 2021-08-15 ENCOUNTER — Encounter: Payer: Self-pay | Admitting: Obstetrics and Gynecology

## 2021-08-15 DIAGNOSIS — N951 Menopausal and female climacteric states: Secondary | ICD-10-CM | POA: Diagnosis not present

## 2021-08-15 MED ORDER — GABAPENTIN 100 MG PO CAPS: ORAL_CAPSULE | ORAL | 1 refills | Status: DC

## 2021-08-15 NOTE — Progress Notes (Signed)
I connected with Beth [name] on 08/15/21 at 11:10 AM EDT by telephone and verified that I am speaking with the correct person using two identifiers.   I discussed the limitations, risks, security and privacy concerns of performing an evaluation and management service by telephone and the availability of in person appointments. I also discussed with the patient that there may be a patient responsible charge related to this service. The patient expressed understanding and agreed to proceed.  The patient was at home I spoke with the patient from my workstation phone The names of people involved in this encounter were: Beth [name] , and [facility] Visit   Chief Complaint:  Chief Complaint  Patient presents with   Gynecologic Exam    Phone visit - Discuss options for hot flashes.    History of Present Illness: 50 y.o. G3P3 presenting for initial evaluation of menopausal symptoms. Symptom onset was several months ago and symptoms have been worsening.  The patient is not still menstruating.  She is not currently on any HRT or hormonal medications.  Her surgical history is notable for status post prior hysterectomy with ovarian conservation.  She reports amenorrhea with no bleeding concerns.  Vasomotor symptoms significant with pronounced sleep disturbances, and limitations in quality of life.  Associated symptoms include fatigue, memory problems, vaginal dryness, and dyspareunia.  Contributory past medical history includes squamous cell cancer.  She has not previously self treated or been under treatment by another provider for these symptoms.   Review of Systems: Review of Systems  Constitutional: Negative.   Genitourinary: Negative.   Psychiatric/Behavioral:  Negative for depression.     Past Medical History:  Patient Active Problem List   Diagnosis Date Noted   Anal squamous cell carcinoma ([place]) 03/01/2021   Squamous cell carcinoma of  anal canal ([place]) 02/25/2021   History of migraine     none in years    Encounter for screening colonoscopy    Rectal polyp    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 08/30/2016 10/07/2016   Tobacco abuse 08/08/2016   Chronic anal fissure s/p partial sphincterotomy 08/30/2016 08/08/2016   Constipation, chronic 08/08/2016    Past Surgical History:  Past Surgical History:  Procedure Laterality Date   ABDOMINAL HYSTERECTOMY  2016   partial   COLONOSCOPY     COLONOSCOPY WITH PROPOFOL N/A 01/28/2021   Procedure: COLONOSCOPY WITH PROPOFOL;  Surgeon: Virgel Manifold, MD;  Location: [facility];  Service: Endoscopy;  Laterality: N/A;   EXCISION HYDRADENITIS LABIA Left 02/25/2021   Procedure: EXCISION of  LABIAL LESIOM;  Surgeon: Michael Boston, MD;  Location: [place];  Service: General;  Laterality: Left;   IR FLUORO GUIDE CV LINE RIGHT  04/18/2021   RECTAL EXAM UNDER ANESTHESIA N/A 02/25/2021   Procedure: ANORECTAL EXAM UNDER ANESTHESIA;  Surgeon: Michael Boston, MD;  Location: [place];  Service: General;  Laterality: N/A;   SPHINCTEROTOMY N/A 08/30/2016   Procedure: LATERAL INTERNAL AND SPHINCTEROTOMY;  Surgeon: Michael Boston, MD;  [facility];  Service: General;  Laterality: N/A;   TONSILLECTOMY  as child   TRANSANAL EXCISION OF RECTAL MASS N/A 02/25/2021   Procedure: TRANSANAL EXCISIONAL  BIOPSY OF ANAL CANAL MASS;  Surgeon: Michael Boston, MD;  Location: [place];  Service: General;  Laterality: N/A;    Gynecologic History: Patient's last menstrual period was 03/13/2012.  Obstetric History: G3P3  Family  History:  Family History  Problem Relation Age of Onset   Breast cancer Paternal Aunt     Social History:  Social History   Socioeconomic History   Marital status: Married    Spouse name: Not on file   Number of children: Not on file   Years of education: Not on file   Highest education  level: Not on file  Occupational History   Not on file  Tobacco Use   Smoking status: Every Day    Packs/day: 1.00    Years: 8.00    Pack years: 8.00    Types: Cigarettes   Smokeless tobacco: Never  Vaping Use   Vaping Use: Never used  Substance and Sexual Activity   Alcohol use: Yes    Comment: occ wine    Drug use: No   Sexual activity: Yes    Birth control/protection: Surgical    Comment: Hysterectomy  Other Topics Concern   Not on file  Social History Narrative   Not on file   Social Determinants of Health   Financial Resource Strain: Not on file  Food Insecurity: Not on file  Transportation Needs: Not on file  Physical Activity: Not on file  Stress: Not on file  Social Connections: Not on file  Intimate Partner Violence: Not on file    Allergies:  Allergies  Allergen Reactions   Clindamycin/Lincomycin Rash   Sulfa Drugs Cross Reactors Itching and Rash    Medications: Prior to Admission medications   Medication Sig Start Date End Date Taking? Authorizing Provider  cetirizine (ZYRTEC) 10 MG tablet Take 10 mg by mouth daily as needed for allergies.   Yes [provider]  spironolactone (ALDACTONE) 100 MG tablet Take 100 mg by mouth daily. Takes at 1200 pm takes for facial acne   Yes [provider]  triamcinolone cream (KENALOG) 0.1 % SMARTSIG:Sparingly Topical Daily 03/30/21  Yes [provider]  magic mouthwash SOLN Take 5 mLs by mouth 4 (four) times daily as needed for mouth pain. Patient not taking: Reported on 08/15/2021 03/28/21   Ladell Pier, MD  ondansetron (ZOFRAN) 8 MG tablet Take 1 tablet (8 mg total) by mouth every 8 (eight) hours as needed for nausea or vomiting. Patient not taking: Reported on 08/15/2021 03/08/21   Ladell Pier, MD  oxyCODONE (OXY IR/ROXICODONE) 5 MG immediate release tablet Take 1-2 tablets (5-10 mg total) by mouth every 6 (six) hours as needed for moderate pain, severe pain or breakthrough  pain. Patient not taking: Reported on 08/15/2021 05/14/21   Orson Slick, MD  phenazopyridine (PYRIDIUM) 200 MG tablet Take 1 tablet (200 mg total) by mouth 3 (three) times daily as needed for pain. Patient not taking: Reported on 08/15/2021 04/28/21   Kyung Rudd, MD  prochlorperazine (COMPAZINE) 10 MG tablet Take 1 tablet (10 mg total) by mouth every 6 (six) hours as needed for nausea or vomiting. Patient not taking: Reported on 08/15/2021 03/08/21   Ladell Pier, MD  valACYclovir (VALTREX) 1000 MG tablet Take 1,000 mg by mouth daily. As needed 01/19/21   [provider]    Physical Exam Last menstrual period 03/13/2012.  Patient's last menstrual period was 03/13/2012.  No physical exam as this was a remote telephone visit to promote social distancing during the current COVID-19 Pandemic   Assessment: 50 y.o. G3P3 vasomotor symptoms  Plan: Problem List Items Addressed This Visit   None Visit Diagnoses     Vasomotor symptoms due to menopause    -  Primary      Menopause is a clinical diagnosis made after the cessation of normal menstruation for 12 months.  In the setting of prior hysterectomy it is based on clinical symptoms but FSH/estradiol level can be examined. The average age of menopause in the use is 52.  Pelvic radiation as well as alkylating chemotherapy agents may also lead to loss of ovarian function.  Given age and clinical history it is reasonable to assume that Beth [name]'s symptoms are menopausal in nature.  We discussed WHI study findings in detail.  In the combined estrogen-progesterone arm breast cancer risk was increased by 1.26 (CI of 1.00 to 1.59), coronary heart disease 1.29 (CI 1.02-1.63), stroke risk 1.41 (1.07-1.85), and pulmonary embolism 2.13 (CI 1.39-3.25).  We discussed clotting risk is higher in patient with a prior/active cancer diagnosis.  That being said the while statistically significant the actual number of cases attributable are relatively  small at an addition 8 cases of breast cancer, 7 more coronary artery event, 8 more strokes, and 8 additional case of pulmonary embolism per 10,000 women.  Study was terminated because of the increased breast cancer risk, this was not seen in the progestin only arm of the study for women without an intact uterus.  In addition it is important to note that HRT also had positive or risk reducing effects, and all cause mortality between the HRT/non-HRT users is not statistically different.  Estrogen-progestin HRT decreased the relative risk of hip fracture 0.66 (CI 0.45-0.98), colorectal cancer 0.63 (0.43-0.92).  Current consensus is to limit dose to the lowest effective dose, and shortest treatment duration possible.  Breast cancer risk appeared to increase after 4 years of use.  Also important to note is that these risk refer to systemic HRT for the treatment of vasomotor symptoms, and do not apply to vaginal preperations with minimal systemic absorption and aimed at treating symptoms of vulvovaginal atrophy.    We briefly touched on findings of WHIMS trial published in 2005 which looked at women 35 year of age or older, and whether HRT was protective against the development of dementia.  The study revealed that HRT actually increased the risk for the development of dementia but was limited by looking only at patients 76 years of age and older.  The subsequent KEEPS trial  In 2012 which looked at HRT in recently postmenopausal women did not show any improvement in cognitive function for women on HRT.  However, there was also no significant cognitive declines seen in recently postmenopausal women receiving HRT as had previously been shown in the [facility] trial.   - discussed non-hormonal options including Catapres (clonidine), Neurontin (gabapentin), as well as Brisdelle (paroxetine)  2) Telephone time 19:40min  3) Return in about 4 weeks (around 09/12/2021) for medication follow up phone.    [name],  MD, [facility], [place] 08/15/2021, 10:24 AM

## 2021-08-15 NOTE — Telephone Encounter (Signed)
Pt calling; doesn't understand directions for gabapentin; at the end it says 1 tablet po then 2 tablets bid; then 3 tablets tid; pt has capsules not tablets; instructions are confusing.  [phone]

## 2021-08-17 ENCOUNTER — Encounter: Payer: Self-pay | Admitting: Physical Therapy

## 2021-08-17 ENCOUNTER — Ambulatory Visit: Payer: BC Managed Care – PPO | Admitting: Physical Therapy

## 2021-08-17 ENCOUNTER — Other Ambulatory Visit: Payer: Self-pay

## 2021-08-17 DIAGNOSIS — M6281 Muscle weakness (generalized): Secondary | ICD-10-CM

## 2021-08-17 DIAGNOSIS — R252 Cramp and spasm: Secondary | ICD-10-CM

## 2021-08-17 NOTE — Therapy (Signed)
[facility] [address], [place], [place], [address] Phone: [phone]   Fax:  [phone]  Physical Therapy Treatment  Patient Details  Name: Beth [name] MRN: [id number] Date of Birth: 01/08/1971 Referring Provider (PT): Hayden Pedro, [place]   Encounter Date: 08/17/2021   PT End of Session - 08/17/21 1605     Visit Number 3    Date for PT Re-Evaluation 09/28/21    Authorization Type BCBS    PT Start Time 1555    PT Stop Time 1640    PT Time Calculation (min) 45 min    Activity Tolerance Patient tolerated treatment well    Behavior During Therapy [facility] for tasks assessed/performed             Past Medical History:  Diagnosis Date   Acne    on face spirolactone for   Anal cancer ([place]) 02/25/2021   Constipation, chronic 08/08/2016   History of migraine    none in years   Wears glasses     Past Surgical History:  Procedure Laterality Date   ABDOMINAL HYSTERECTOMY  2016   partial   COLONOSCOPY     COLONOSCOPY WITH PROPOFOL N/A 01/28/2021   Procedure: COLONOSCOPY WITH PROPOFOL;  Surgeon: Virgel Manifold, MD;  Location: [facility];  Service: Endoscopy;  Laterality: N/A;   EXCISION HYDRADENITIS LABIA Left 02/25/2021   Procedure: EXCISION of  LABIAL LESIOM;  Surgeon: Michael Boston, MD;  Location: [place];  Service: General;  Laterality: Left;   IR FLUORO GUIDE CV LINE RIGHT  04/18/2021   RECTAL EXAM UNDER ANESTHESIA N/A 02/25/2021   Procedure: ANORECTAL EXAM UNDER ANESTHESIA;  Surgeon: Michael Boston, MD;  Location: [place];  Service: General;  Laterality: N/A;   SPHINCTEROTOMY N/A 08/30/2016   Procedure: LATERAL INTERNAL AND SPHINCTEROTOMY;  Surgeon: Michael Boston, MD;  [facility];  Service: General;  Laterality: N/A;   TONSILLECTOMY  as child   TRANSANAL EXCISION OF RECTAL MASS N/A 02/25/2021   Procedure: TRANSANAL EXCISIONAL  BIOPSY OF ANAL CANAL  MASS;  Surgeon: Michael Boston, MD;  Location: [place];  Service: General;  Laterality: N/A;    There were no vitals filed for this visit.   Subjective Assessment - 08/17/21 1558     Subjective The stretches feel like they help afterward but the tension comes back.    Currently in Pain? Yes    Pain Score 3     Pain Location Leg    Pain Orientation Right;Left;Posterior;Proximal    Pain Descriptors / Indicators Sore;Shooting    Pain Type Chronic pain    Pain Radiating Towards back of the legs    Aggravating Factors  after lying down sitting for a long time    Pain Relieving Factors moving    Multiple Pain Sites No                               [facility] Adult PT Treatment/Exercise - 08/17/21 0001       Lumbar Exercises: Stretches   Other Lumbar Stretch Exercise chlid pose with sidebending      Manual Therapy   Manual Therapy Myofascial release;Soft tissue mobilization    Soft tissue mobilization lumbar and thoracic paraspinals    Myofascial Release abdominal wall especially lower and left side, thoracolumbar fascia  PT Short Term Goals - 08/17/21 1602       PT SHORT TERM GOAL #1   Title ind with initial HEP    Status Achieved               PT Long Term Goals - 07/06/21 1621       PT LONG TERM GOAL #1   Title Pt will be ind with HEP for pelvic health    Time 12    Period Weeks    Status New    Target Date 09/28/21      PT LONG TERM GOAL #2   Title Pt will report she is able to progress dilator use on her own    Time 12    Period Weeks    Status New    Target Date 09/28/21      PT LONG TERM GOAL #3   Title Pt will report at least 60% less pain with bowel and bladder emptying    Time 12    Period Weeks    Status New    Target Date 09/28/21      PT LONG TERM GOAL #4   Title Pt will report at least 50% less pain in her legs and glutes so she can start a walking routine for general  health    Time 12    Period Weeks    Status New    Target Date 09/28/21                   Plan - 08/17/21 1643     Clinical Impression Statement Pt met short term goal for initial HEP.  Pt responded well to abdominal fascial release and lumbar to thoracic releases.  Pt was tight on Left side for both abdomen and lumbar into thoracic with much more tissue pliability after today's treatment.  Pt was given child pose stretch with sidebending to continue to maintain improved soft tissue length.    PT Treatment/Interventions ADLs/Self Care Home Management;Biofeedback;Cryotherapy;Electrical Stimulation;Moist Heat;Therapeutic activities;Neuromuscular re-education;Therapeutic exercise;Patient/family education;Manual techniques;Passive range of motion;Dry needling    PT Next Visit Plan have discussed dry needling and wants to use as last resort; f/u on abdominal fascil release, breathing and bulging and basic core exercises    PT Home Exercise Plan Access Code: [email or web address]    Consulted and Agree with Plan of Care Patient             Patient will benefit from skilled therapeutic intervention in order to improve the following deficits and impairments:  Decreased endurance, Decreased range of motion, Increased fascial restricitons, Pain, Decreased coordination, Increased muscle spasms, Decreased strength  Visit Diagnosis: Cramp and spasm  Muscle weakness (generalized)     Problem List Patient Active Problem List   Diagnosis Date Noted   Anal squamous cell carcinoma ([place]) 03/01/2021   Squamous cell carcinoma of anal canal ([place]) 02/25/2021   History of migraine    Encounter for screening colonoscopy    Rectal polyp    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 08/30/2016 10/07/2016   Tobacco abuse 08/08/2016   Chronic anal fissure s/p partial sphincterotomy 08/30/2016 08/08/2016   Constipation, chronic 08/08/2016    Camillo Flaming Prim Morace, PT 08/17/2021, 4:50  PM  [facility] [address] [place], [place], [address] Phone: [phone]   Fax:  [phone]  Name: Beth [name] MRN: [id number] Date of Birth: 1971-09-20

## 2021-08-23 ENCOUNTER — Ambulatory Visit: Payer: BC Managed Care – PPO | Attending: Radiation Oncology | Admitting: Physical Therapy

## 2021-08-23 ENCOUNTER — Other Ambulatory Visit: Payer: Self-pay

## 2021-08-23 DIAGNOSIS — R252 Cramp and spasm: Secondary | ICD-10-CM | POA: Insufficient documentation

## 2021-08-23 DIAGNOSIS — M6281 Muscle weakness (generalized): Secondary | ICD-10-CM | POA: Insufficient documentation

## 2021-08-23 NOTE — Therapy (Signed)
[facility] [address] [place], [place], [address] Phone: [phone]   Fax:  [phone]  Physical Therapy Treatment  Patient Details  Name: Beth [name] MRN: [id number] Date of Birth: 1971-07-06 Referring Provider (PT): Hayden Pedro, [place]   Encounter Date: 08/23/2021   PT End of Session - 08/23/21 1505     Visit Number 4    Date for PT Re-Evaluation 09/28/21    Authorization Type BCBS    PT Start Time 1324    PT Stop Time 1445    PT Time Calculation (min) 48 min    Activity Tolerance Patient tolerated treatment well    Behavior During Therapy [facility] for tasks assessed/performed             Past Medical History:  Diagnosis Date   Acne    on face spirolactone for   Anal cancer ([place]) 02/25/2021   Constipation, chronic 08/08/2016   History of migraine    none in years   Wears glasses     Past Surgical History:  Procedure Laterality Date   ABDOMINAL HYSTERECTOMY  2016   partial   COLONOSCOPY     COLONOSCOPY WITH PROPOFOL N/A 01/28/2021   Procedure: COLONOSCOPY WITH PROPOFOL;  Surgeon: Virgel Manifold, MD;  Location: [facility];  Service: Endoscopy;  Laterality: N/A;   EXCISION HYDRADENITIS LABIA Left 02/25/2021   Procedure: EXCISION of  LABIAL LESIOM;  Surgeon: Michael Boston, MD;  Location: [place];  Service: General;  Laterality: Left;   IR FLUORO GUIDE CV LINE RIGHT  04/18/2021   RECTAL EXAM UNDER ANESTHESIA N/A 02/25/2021   Procedure: ANORECTAL EXAM UNDER ANESTHESIA;  Surgeon: Michael Boston, MD;  Location: [place];  Service: General;  Laterality: N/A;   SPHINCTEROTOMY N/A 08/30/2016   Procedure: LATERAL INTERNAL AND SPHINCTEROTOMY;  Surgeon: Michael Boston, MD;  Location: [facility];  Service: General;  Laterality: N/A;   TONSILLECTOMY  as child   TRANSANAL EXCISION OF RECTAL MASS N/A 02/25/2021   Procedure: TRANSANAL EXCISIONAL  BIOPSY OF ANAL CANAL  MASS;  Surgeon: Michael Boston, MD;  Location: [place];  Service: General;  Laterality: N/A;    There were no vitals filed for this visit.   Subjective Assessment - 08/23/21 1506     Subjective My legs feel better than they have in a while    Currently in Pain? No/denies                               [facility] Adult PT Treatment/Exercise - 08/23/21 0001       Manual Therapy   Manual Therapy Myofascial release;Soft tissue mobilization;Internal Pelvic Floor    Manual therapy comments pt identity confirmed and internal with consent    Soft tissue mobilization lumbar and thoracic paraspinals    Myofascial Release abdominal wall especially lower and left side, thoracolumbar fascia    Internal Pelvic Floor bilat levators and vaginal canal, scar at incision from hysterectomy                      PT Short Term Goals - 08/17/21 1602       PT SHORT TERM GOAL #1   Title ind with initial HEP    Status Achieved               PT Long Term Goals - 08/23/21 1401  PT LONG TERM GOAL #1   Title Pt will be ind with HEP for pelvic health    Status On-going      PT LONG TERM GOAL #2   Title Pt will report she is able to progress dilator use on her own    Status On-going      PT LONG TERM GOAL #3   Title Pt will report at least 60% less pain with bowel and bladder emptying    Baseline feels about the same    Status On-going      PT LONG TERM GOAL #4   Title Pt will report at least 50% less pain in her legs and glutes so she can start a walking routine for general health    Baseline feels better than it has in a while    Status On-going                   Plan - 08/23/21 1453     Clinical Impression Statement Pt did well with STM today.  She tolerated fascial release around scar tissue from hysterectomy incision.  Pt still has a lot of tension in lumbar and thoracic regions and did well with releases there as well.   Discussed possibly dry needling next time due to how tight the low back and thoracic paraspinals are.  Pt will benefit from skilled PT to continue with improved soft tissue mobility    PT Treatment/Interventions ADLs/Self Care Home Management;Biofeedback;Cryotherapy;Electrical Stimulation;Moist Heat;Therapeutic activities;Neuromuscular re-education;Therapeutic exercise;Patient/family education;Manual techniques;Passive range of motion;Dry needling    PT Next Visit Plan possilby 1-2 dry needles in low back to try release, f/u on internal fascial release, breathing and bulging and basic core    PT Home Exercise Plan Access Code: [email or web address]    Consulted and Agree with Plan of Care Patient             Patient will benefit from skilled therapeutic intervention in order to improve the following deficits and impairments:  Decreased endurance, Decreased range of motion, Increased fascial restricitons, Pain, Decreased coordination, Increased muscle spasms, Decreased strength  Visit Diagnosis: Cramp and spasm  Muscle weakness (generalized)     Problem List Patient Active Problem List   Diagnosis Date Noted   Anal squamous cell carcinoma ([place]) 03/01/2021   Squamous cell carcinoma of anal canal ([place]) 02/25/2021   History of migraine    Encounter for screening colonoscopy    Rectal polyp    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 08/30/2016 10/07/2016   Tobacco abuse 08/08/2016   Chronic anal fissure s/p partial sphincterotomy 08/30/2016 08/08/2016   Constipation, chronic 08/08/2016    Jule Ser, PT 08/23/2021, 3:08 PM  [facility] [address], [place], [place], [address] Phone: [phone]   Fax:  [phone]  Name: Beth [name] MRN: [id number] Date of Birth: 10/25/1971

## 2021-08-30 ENCOUNTER — Encounter: Payer: Self-pay | Admitting: Nurse Practitioner

## 2021-08-30 ENCOUNTER — Inpatient Hospital Stay: Payer: BC Managed Care – PPO | Attending: Nurse Practitioner | Admitting: Nurse Practitioner

## 2021-08-30 ENCOUNTER — Other Ambulatory Visit: Payer: Self-pay

## 2021-08-30 ENCOUNTER — Ambulatory Visit: Payer: BC Managed Care – PPO | Admitting: Physical Therapy

## 2021-08-30 ENCOUNTER — Encounter: Payer: Self-pay | Admitting: Physical Therapy

## 2021-08-30 VITALS — BP 129/83 | HR 63 | Temp 98.1°F | Resp 18 | Ht 67.0 in | Wt 157.2 lb

## 2021-08-30 DIAGNOSIS — Z720 Tobacco use: Secondary | ICD-10-CM | POA: Diagnosis not present

## 2021-08-30 DIAGNOSIS — C210 Malignant neoplasm of anus, unspecified: Secondary | ICD-10-CM

## 2021-08-30 DIAGNOSIS — R928 Other abnormal and inconclusive findings on diagnostic imaging of breast: Secondary | ICD-10-CM | POA: Diagnosis not present

## 2021-08-30 DIAGNOSIS — M6281 Muscle weakness (generalized): Secondary | ICD-10-CM

## 2021-08-30 DIAGNOSIS — R252 Cramp and spasm: Secondary | ICD-10-CM | POA: Diagnosis not present

## 2021-08-30 DIAGNOSIS — C211 Malignant neoplasm of anal canal: Secondary | ICD-10-CM | POA: Diagnosis not present

## 2021-08-30 NOTE — Therapy (Signed)
[facility] [address] [place], [place], [address] Phone: [phone]   Fax:  [phone]  Physical Therapy Treatment  Patient Details  Name: ROLINDA IMPSON MRN: [id number] Date of Birth: [birth year] Referring Provider (PT): Hayden Pedro, [place]   Encounter Date: 08/30/2021   PT End of Session - 08/30/21 0956     Visit Number 5    Date for PT Re-Evaluation 09/28/21    Authorization Type BCBS    PT Start Time 0930    PT Stop Time 1010    PT Time Calculation (min) 40 min    Activity Tolerance Patient tolerated treatment well    Behavior During Therapy [facility] for tasks assessed/performed             Past Medical History:  Diagnosis Date   Acne    on face spirolactone for   Anal cancer ([place]) 02/25/2021   Constipation, chronic 08/08/2016   History of migraine    none in years   Wears glasses     Past Surgical History:  Procedure Laterality Date   ABDOMINAL HYSTERECTOMY  2016   partial   COLONOSCOPY     COLONOSCOPY WITH PROPOFOL N/A 01/28/2021   Procedure: COLONOSCOPY WITH PROPOFOL;  Surgeon: Virgel Manifold, MD;  Location: [facility];  Service: Endoscopy;  Laterality: N/A;   EXCISION HYDRADENITIS LABIA Left 02/25/2021   Procedure: EXCISION of  LABIAL LESIOM;  Surgeon: Michael Boston, MD;  Location: [place];  Service: General;  Laterality: Left;   IR FLUORO GUIDE CV LINE RIGHT  04/18/2021   RECTAL EXAM UNDER ANESTHESIA N/A 02/25/2021   Procedure: ANORECTAL EXAM UNDER ANESTHESIA;  Surgeon: Michael Boston, MD;  Location: [place];  Service: General;  Laterality: N/A;   SPHINCTEROTOMY N/A 08/30/2016   Procedure: LATERAL INTERNAL AND SPHINCTEROTOMY;  Surgeon: Michael Boston, MD;  [facility];  Service: General;  Laterality: N/A;   TONSILLECTOMY  as child   TRANSANAL EXCISION OF RECTAL MASS N/A 02/25/2021   Procedure: TRANSANAL EXCISIONAL  BIOPSY OF ANAL CANAL  MASS;  Surgeon: Michael Boston, MD;  Location: [place];  Service: General;  Laterality: N/A;    There were no vitals filed for this visit.   Subjective Assessment - 08/30/21 1019     Subjective I was able to use the dilators and that was good.  The burning with toileting is the same.  I walked a lot at a car show this weekend.    Currently in Pain? No/denies                               [facility] Adult PT Treatment/Exercise - 08/30/21 0001       Lumbar Exercises: Supine   Large Ball Abdominal Isometric Limitations LE rolling out and UE flexion with core    Other Supine Lumbar Exercises horizontal abduction; diagonals - red band - 20x      Manual Therapy   Soft tissue mobilization lumbar and thoracic paraspinals              Trigger Point Dry Needling - 08/30/21 0001     Consent Given? Yes    Education Handout Provided Previously provided    Muscles Treated Back/Hip Lumbar multifidi                   PT Education - 08/30/21 1018     Education  Details Access Code: [email or web address]    Person(s) Educated Patient    Methods Explanation;Demonstration;Tactile cues;Verbal cues;Handout    Comprehension Verbalized understanding;Returned demonstration              PT Short Term Goals - 08/17/21 1602       PT SHORT TERM GOAL #1   Title ind with initial HEP    Status Achieved               PT Long Term Goals - 08/30/21 0953       PT LONG TERM GOAL #1   Title Pt will be ind with HEP for pelvic health      PT LONG TERM GOAL #2   Title Pt will report she is able to progress dilator use on her own    Baseline I used the dilators this weekend    Status On-going      PT LONG TERM GOAL #3   Title Pt will report at least 60% less pain with bowel and bladder emptying    Baseline feels about the same    Status On-going      PT LONG TERM GOAL #4   Title Pt will report at least 50% less pain in her legs and glutes so she can  start a walking routine for general health    Baseline I walked lot this weekend and it hurt a lot around the groin    Status On-going                   Plan - 08/30/21 0958     Clinical Impression Statement Pt did well with dry needling but was bracing and guarding a lot. Today's treatment also focused on improved core strengthening.  She did well with basic core exercises and bands for improving postural strength.    PT Treatment/Interventions ADLs/Self Care Home Management;Biofeedback;Cryotherapy;Electrical Stimulation;Moist Heat;Therapeutic activities;Neuromuscular re-education;Therapeutic exercise;Patient/family education;Manual techniques;Passive range of motion;Dry needling    PT Next Visit Plan f/u on dilators, core strength, internal soft tissue mobs/bladder release if still have burning    PT Home Exercise Plan Access Code: [email or web address]    Consulted and Agree with Plan of Care Patient             Patient will benefit from skilled therapeutic intervention in order to improve the following deficits and impairments:  Decreased endurance, Decreased range of motion, Increased fascial restricitons, Pain, Decreased coordination, Increased muscle spasms, Decreased strength  Visit Diagnosis: Cramp and spasm  Muscle weakness (generalized)     Problem List Patient Active Problem List   Diagnosis Date Noted   Anal squamous cell carcinoma ([place]) 03/01/2021   Squamous cell carcinoma of anal canal ([place]) 02/25/2021   History of migraine    Encounter for screening colonoscopy    Rectal polyp    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 08/30/2016 10/07/2016   Tobacco abuse 08/08/2016   Chronic anal fissure s/p partial sphincterotomy 08/30/2016 08/08/2016   Constipation, chronic 08/08/2016    Camillo Flaming Keyra Virella, PT 08/30/2021, 12:05 PM  [facility] [address], [place], [place],  [address] Phone: [phone]   Fax:  [phone]  Name: DARLINE FAITH MRN: [id number] Date of Birth: February 25, 1971

## 2021-08-30 NOTE — Progress Notes (Signed)
[place] OFFICE PROGRESS NOTE   Diagnosis: Anal cancer  INTERVAL HISTORY:   Beth [name] returns as scheduled.  She continues to have soreness bilateral groin and lower buttock regions.  Skin has healed.  Periodic blood with bowel movements.  Anoscopy by Dr. Johney Maine 07/20/2021-divet in the right posterior lateral aspect with some right anterior and right lateral folds consistent with radiation treatment.  No ulceration.  Not fully resolved but markedly shrunk down.  Objective:  Vital signs in last 24 hours:  Blood pressure 129/83, pulse 63, temperature 98.1 F (36.7 C), temperature source Oral, resp. rate 18, height 5\' 7"  (1.702 m), weight 157 lb 3.2 oz (71.3 kg), last menstrual period 03/13/2012, SpO2 96 %.    HEENT: No thrush or ulcers. Lymphatics: No palpable cervical, supraclavicular, axillary or inguinal lymph nodes. Resp: Lungs clear bilaterally. Cardio: Regular rate and rhythm. GI: Abdomen soft and nontender.  No hepatosplenomegaly.  1 soft external hemorrhoid.  Perianal skin has healed.  Digital rectal exam deferred due to recent anoscopy. Vascular: No leg edema.   Lab Results:  Lab Results  Component Value Date   WBC 5.4 05/30/2021   HGB 12.0 05/30/2021   HCT 36.3 05/30/2021   MCV 105.2 (H) 05/30/2021   PLT 149 (L) 05/30/2021   NEUTROABS 3.8 05/30/2021    Imaging:  No results found.  Medications: I have reviewed the patient's current medications.  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  Small adjacent lower perirectal lymph nodes in addition to a 0.5 cm sacral node.  Focal enhancing lesion in the right hepatic lobe 1.2 x 0.8 x 0.9 cm.  2.1 x 1.5 cm cystic lesion right ovary with thin enhancing rim. PET scan 03/16/2021-hypermetabolic mass within the anal canal.  No definite evidence of local or distant metastatic disease.  7 mm left submandibular lymph node with mild FDG uptake, likely reactive.  Focal site of FDG uptake within the inner quadrant of the right breast. Radiation 03/21/2021-04/27/2021 Cycle one 5-FU/mitomycin-C 03/21/2021 Cycle two 5-FU/Mitomycin-C 04/18/2021, 5-FU dose reduced secondary to mucositis Anoscopy by Dr. Johney Maine 07/20/2021-divet in the right posterior lateral aspect with some right anterior and right lateral folds consistent with radiation treatment.  No ulceration.  Not fully resolved but markedly shrunk down.  Remote history of an anal fissure repair Tobacco use Moderate alcohol use Eczema Right breast mass- PET scan 03/16/2021 with focal FDG activity in the inner right breast Mammogram and right breast ultrasound 03/28/2021- negative Bilateral breast MRI 03/31/2021- 7 mm mass in the upper inner right breast correlating with the PET findings, no other abnormality, no adenopathy MRI guided biopsy of right breast mass 04/11/2021- fibrocystic change with sclerosing adenosis, small focus of inflammation/fibrosis suggestive of resolving fat necrosis, no evidence of malignancy--repeat bilateral breast MRI at a 77-month interval    Disposition: [name] appears stable.  She will continue anal  surveillance by Dr. Johney Maine, next due early November.    We  referred her for the repeat bilateral breast MRI late October of this year.  She will return for a CBC and follow-up visit here in 3 months.  We are available to see her sooner if needed   Ned Card ANP/GNP-BC   08/30/2021  11:05 AM

## 2021-08-30 NOTE — Patient Instructions (Signed)
Access Code: [email or web address] URL: [email or web address] Date: 08/30/2021 Prepared by: Jari Favre  Exercises Supine Figure 4 Piriformis Stretch - 1 x daily - 7 x weekly - 1 sets - 3 reps - 30 sec hold Supine Hamstring Stretch with Strap - 1 x daily - 7 x weekly - 1 sets - 3 reps - 30 sec hold Supine Lower Trunk Rotation - 1 x daily - 7 x weekly - 1 sets - 10 reps - 5 sec hold Sidelying Thoracic Rotation with Open Book - 1 x daily - 7 x weekly - 1 sets - 5 reps - 10 sec hold Child's Pose with Sidebending - 1 x daily - 7 x weekly - 3 reps - 1 sets - 30 sec hold Supine Shoulder Horizontal Abduction with Resistance - 1 x daily - 7 x weekly - 3 sets - 10 reps Standing Shoulder Diagonal Horizontal Abduction 60/120 Degrees with Resistance - 1 x daily - 7 x weekly - 3 sets - 10 reps Shoulder Extension with Resistance - 1 x daily - 7 x weekly - 3 sets - 10 reps Standing Bilateral Low Shoulder Row with Anchored Resistance - 1 x daily - 7 x weekly - 3 sets - 10 reps

## 2021-09-06 ENCOUNTER — Encounter: Payer: BC Managed Care – PPO | Admitting: Physical Therapy

## 2021-09-08 ENCOUNTER — Ambulatory Visit: Payer: BC Managed Care – PPO | Admitting: Physical Therapy

## 2021-09-08 ENCOUNTER — Other Ambulatory Visit: Payer: Self-pay

## 2021-09-08 ENCOUNTER — Encounter: Payer: Self-pay | Admitting: Physical Therapy

## 2021-09-08 DIAGNOSIS — M6281 Muscle weakness (generalized): Secondary | ICD-10-CM

## 2021-09-08 DIAGNOSIS — R252 Cramp and spasm: Secondary | ICD-10-CM | POA: Diagnosis not present

## 2021-09-08 NOTE — Therapy (Signed)
[facility] [address], [place], [place], [address] Phone: [phone]   Fax:  [phone]  Physical Therapy Treatment  Patient Details  Name: Beth [name] MRN: [id number] Date of Birth: [birth year] Referring Provider (PT): Hayden Pedro, [place]   Encounter Date: 09/08/2021   PT End of Session - 09/08/21 0831     Visit Number 6    Date for PT Re-Evaluation 09/28/21    Authorization Type BCBS    PT Start Time 0804    PT Stop Time 0844    PT Time Calculation (min) 40 min    Activity Tolerance Patient tolerated treatment well    Behavior During Therapy [facility] for tasks assessed/performed             Past Medical History:  Diagnosis Date   Acne    on face spirolactone for   Anal cancer ([place]) 02/25/2021   Constipation, chronic 08/08/2016   History of migraine    none in years   Wears glasses     Past Surgical History:  Procedure Laterality Date   ABDOMINAL HYSTERECTOMY  2016   partial   COLONOSCOPY     COLONOSCOPY WITH PROPOFOL N/A 01/28/2021   Procedure: COLONOSCOPY WITH PROPOFOL;  Surgeon: Virgel Manifold, MD;  Location: [facility];  Service: Endoscopy;  Laterality: N/A;   EXCISION HYDRADENITIS LABIA Left 02/25/2021   Procedure: EXCISION of  LABIAL LESIOM;  Surgeon: Michael Boston, MD;  Location: [place];  Service: General;  Laterality: Left;   IR FLUORO GUIDE CV LINE RIGHT  04/18/2021   RECTAL EXAM UNDER ANESTHESIA N/A 02/25/2021   Procedure: ANORECTAL EXAM UNDER ANESTHESIA;  Surgeon: Michael Boston, MD;  Location: [place];  Service: General;  Laterality: N/A;   SPHINCTEROTOMY N/A 08/30/2016   Procedure: LATERAL INTERNAL AND SPHINCTEROTOMY;  Surgeon: Michael Boston, MD;  [facility];  Service: General;  Laterality: N/A;   TONSILLECTOMY  as child   TRANSANAL EXCISION OF RECTAL MASS N/A 02/25/2021   Procedure: TRANSANAL EXCISIONAL  BIOPSY OF ANAL CANAL  MASS;  Surgeon: Michael Boston, MD;  Location: [place];  Service: General;  Laterality: N/A;    There were no vitals filed for this visit.   Subjective Assessment - 09/08/21 0807     Subjective Pt states things are getting a little better slowly.  I am trying to do a little more walking, still using the dilators    Currently in Pain? No/denies                               [facility] Adult PT Treatment/Exercise - 09/08/21 0001       Lumbar Exercises: Stretches   Other Lumbar Stretch Exercise lumbar flexion and side bend withth ball; ball behind back thoracic extension -      Lumbar Exercises: Aerobic   UBE (Upper Arm Bike) L1 2/2 minutes; f/w b/w      Lumbar Exercises: Supine   Clam 20 reps   blue loop   Bent Knee Raise 10 reps   1.5 lb   Straight Leg Raise 10 reps      Manual Therapy   Manual Therapy Internal Pelvic Floor    Manual therapy comments pt identity confirmed and internal with consent    Internal Pelvic Floor bilat levators and vaginal canal fascial release; releases around urethra  PT Short Term Goals - 08/17/21 1602       PT SHORT TERM GOAL #1   Title ind with initial HEP    Status Achieved               PT Long Term Goals - 09/08/21 0962       PT LONG TERM GOAL #1   Title Pt will be ind with HEP for pelvic health    Status On-going      PT LONG TERM GOAL #2   Title Pt will report she is able to progress dilator use on her own    Status Achieved      PT LONG TERM GOAL #3   Title Pt will report at least 60% less pain with bowel and bladder emptying    Baseline 80% from when starting, still have to run to have a BM sometimes      PT LONG TERM GOAL #4   Title Pt will report at least 50% less pain in her legs and glutes so she can start a walking routine for general health    Baseline 60% better worse when sitting a long time or walking too much or lifting into hip flexion in  sitting especially    Status On-going                   Plan - 09/08/21 0846     Clinical Impression Statement Pt responded well to internal STM with some tigger point release on Rt side in levators and fascial release around the rectum and urethra on the left side.  Pt continues to work on core strength and will benefit from skilled PT to progress with core and release tissue adhesions for improved pain and toileting.    PT Treatment/Interventions ADLs/Self Care Home Management;Biofeedback;Cryotherapy;Electrical Stimulation;Moist Heat;Therapeutic activities;Neuromuscular re-education;Therapeutic exercise;Patient/family education;Manual techniques;Passive range of motion;Dry needling    PT Next Visit Plan f/u on groin pain, add more core strength, continue internal STM if that helped last time    PT Home Exercise Plan Access Code: [email or web address]    Consulted and Agree with Plan of Care Patient             Patient will benefit from skilled therapeutic intervention in order to improve the following deficits and impairments:  Decreased endurance, Decreased range of motion, Increased fascial restricitons, Pain, Decreased coordination, Increased muscle spasms, Decreased strength  Visit Diagnosis: Cramp and spasm  Muscle weakness (generalized)     Problem List Patient Active Problem List   Diagnosis Date Noted   Anal squamous cell carcinoma ([place]) 03/01/2021   Squamous cell carcinoma of anal canal ([place]) 02/25/2021   History of migraine    Encounter for screening colonoscopy    Rectal polyp    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 08/30/2016 10/07/2016   Tobacco abuse 08/08/2016   Chronic anal fissure s/p partial sphincterotomy 08/30/2016 08/08/2016   Constipation, chronic 08/08/2016    Beth [name], PT 09/08/2021, 8:49 AM  [facility] [address], [place], [place],  [address] Phone: [phone]   Fax:  [phone]  Name: Beth [name] MRN: [id number] Date of Birth: Aug 06, 1971

## 2021-09-12 ENCOUNTER — Encounter: Payer: Self-pay | Admitting: Obstetrics and Gynecology

## 2021-09-12 ENCOUNTER — Ambulatory Visit (INDEPENDENT_AMBULATORY_CARE_PROVIDER_SITE_OTHER): Payer: BC Managed Care – PPO | Admitting: Obstetrics and Gynecology

## 2021-09-12 ENCOUNTER — Other Ambulatory Visit: Payer: Self-pay

## 2021-09-12 DIAGNOSIS — N951 Menopausal and female climacteric states: Secondary | ICD-10-CM | POA: Diagnosis not present

## 2021-09-12 MED ORDER — GABAPENTIN 300 MG PO CAPS: 300.0000 mg | ORAL_CAPSULE | Freq: Two times a day (BID) | ORAL | 2 refills | Status: DC

## 2021-09-12 NOTE — Progress Notes (Signed)
I connected with Beth [name]  on 09/12/2021 at 11:10 AM EDT by telephone and verified that I am speaking with the correct person using two identifiers.   I discussed the limitations, risks, security and privacy concerns of performing an evaluation and management service by telephone and the availability of in person appointments. I also discussed with the patient that there may be a patient responsible charge related to this service. The patient expressed understanding and agreed to proceed.  The patient was at home I spoke with the patient from my workstation phone The names of people involved in this encounter were: Beth [name] , and Beth [name]   Obstetrics & Gynecology Office Visit   Chief Complaint:  Chief Complaint  Patient presents with   Follow-up    Phone visit - medication F/U, thinks she need a dose increase.    History of Present Illness: 50 y.o. G3P3 presenting for medication follow up for a diagnosis of vasomotor symptoms associated with menopause.  She is currently being managed with gabapentin.   The patient reports improvement in symptoms but continued night time hot flashes .  On her current medication regimen has not had menstrual complaints secondary to history of prior hysterectomy.   She has not noted any side-effects or new symptoms.    Review of Systems: Review of Systems  Constitutional: Negative.   Gastrointestinal: Negative.   Genitourinary: Negative.   Neurological: Negative.     Past Medical History:  Past Medical History:  Diagnosis Date   Acne    on face spirolactone for   Anal cancer ([place]) 02/25/2021   Constipation, chronic 08/08/2016   History of migraine    none in years   Wears glasses     Past Surgical History:  Past Surgical History:  Procedure Laterality Date   ABDOMINAL HYSTERECTOMY  2016   partial   COLONOSCOPY     COLONOSCOPY WITH PROPOFOL N/A 01/28/2021   Procedure: COLONOSCOPY WITH PROPOFOL;  Surgeon: Virgel Manifold, MD;  Location: [facility];  Service: Endoscopy;  Laterality: N/A;   EXCISION HYDRADENITIS LABIA Left 02/25/2021   Procedure: EXCISION of  LABIAL LESIOM;  Surgeon: Michael Boston, MD;  Location: [place];  Service: General;  Laterality: Left;   IR FLUORO GUIDE CV LINE RIGHT  04/18/2021   RECTAL EXAM UNDER ANESTHESIA N/A 02/25/2021   Procedure: ANORECTAL EXAM UNDER ANESTHESIA;  Surgeon: Michael Boston, MD;  Location: [place];  Service: General;  Laterality: N/A;   SPHINCTEROTOMY N/A 08/30/2016   Procedure: LATERAL INTERNAL AND SPHINCTEROTOMY;  Surgeon: Michael Boston, MD;  [facility];  Service: General;  Laterality: N/A;   TONSILLECTOMY  as child   TRANSANAL EXCISION OF RECTAL MASS N/A 02/25/2021   Procedure: TRANSANAL EXCISIONAL  BIOPSY OF ANAL CANAL MASS;  Surgeon: Michael Boston, MD;  Location: [place];  Service: General;  Laterality: N/A;    Gynecologic History: Patient's last menstrual period was 03/13/2012.  Obstetric History: G3P3  Family History:  Family History  Problem Relation Age of Onset   Breast cancer Paternal Aunt     Social History:  Social History   Socioeconomic History   Marital status: Married    Spouse name: Not on file   Number of children: Not on file   Years of education: Not on file   Highest education level: Not on file  Occupational History   Not on file  Tobacco Use   Smoking status: Every Day  Packs/day: 1.00    Years: 8.00    Pack years: 8.00    Types: Cigarettes   Smokeless tobacco: Never  Vaping Use   Vaping Use: Never used  Substance and Sexual Activity   Alcohol use: Yes    Comment: occ wine    Drug use: No   Sexual activity: Yes    Birth control/protection: Surgical    Comment: Hysterectomy  Other Topics Concern   Not on file  Social History Narrative   Not on file   Social Determinants of Health   Financial Resource Strain: Not on file  Food Insecurity: Not  on file  Transportation Needs: Not on file  Physical Activity: Not on file  Stress: Not on file  Social Connections: Not on file  Intimate Partner Violence: Not on file    Allergies:  Allergies  Allergen Reactions   Clindamycin/Lincomycin Rash   Sulfa Drugs Cross Reactors Itching and Rash    Medications: Prior to Admission medications   Medication Sig Start Date End Date Taking? Authorizing Provider  spironolactone (ALDACTONE) 100 MG tablet Take 100 mg by mouth daily. Takes at 1200 pm takes for facial acne   Yes [provider]  triamcinolone cream (KENALOG) 0.1 % SMARTSIG:Sparingly Topical Daily 03/30/21  Yes [provider]  cetirizine (ZYRTEC) 10 MG tablet Take 10 mg by mouth daily as needed for allergies.    [provider]  gabapentin (NEURONTIN) 100 MG capsule Take 1 capsule (100 mg total) by mouth at bedtime for 3 days, THEN 2 capsules (200 mg total) at bedtime for 3 days, THEN 3 capsules (300 mg total) at bedtime for 3 days. 1 tablet po once daily on day 1, 1 tablet po bid day 2, then 1 tablet po tid. 08/15/21 08/24/21  Beth Mood, MD  magic mouthwash SOLN Take 5 mLs by mouth 4 (four) times daily as needed for mouth pain. Patient not taking: Reported on 08/15/2021 03/28/21   Ladell Pier, MD  ondansetron (ZOFRAN) 8 MG tablet Take 1 tablet (8 mg total) by mouth every 8 (eight) hours as needed for nausea or vomiting. Patient not taking: Reported on 08/15/2021 03/08/21   Ladell Pier, MD  oxyCODONE (OXY IR/ROXICODONE) 5 MG immediate release tablet Take 1-2 tablets (5-10 mg total) by mouth every 6 (six) hours as needed for moderate pain, severe pain or breakthrough pain. Patient not taking: Reported on 08/15/2021 05/14/21   Orson Slick, MD  phenazopyridine (PYRIDIUM) 200 MG tablet Take 1 tablet (200 mg total) by mouth 3 (three) times daily as needed for pain. Patient not taking: Reported on 08/15/2021 04/28/21   Kyung Rudd, MD  prochlorperazine  (COMPAZINE) 10 MG tablet Take 1 tablet (10 mg total) by mouth every 6 (six) hours as needed for nausea or vomiting. Patient not taking: Reported on 08/15/2021 03/08/21   Ladell Pier, MD  valACYclovir (VALTREX) 1000 MG tablet Take 1,000 mg by mouth daily. As needed 01/19/21   [provider]    Physical Exam Vitals: There were no vitals filed for this visit. Patient's last menstrual period was 03/13/2012.  No physical exam as this was a remote telephone visit to promote social distancing during the current COVID-19 Pandemic   Female chaperone present for pelvic  portions of the physical exam  Assessment: 50 y.o. G3P3 follow up vasomotor symptoms  Plan: Problem List Items Addressed This Visit   None Visit Diagnoses     Vasomotor symptoms due to menopause    -  Primary      1) Vasomotor symptoms - some improvement noted but continued night time vasomotor symptoms - increase gabpentin to 300mg  po bid  2) Telephone time 8:47min  3) Return in about 6 weeks (around 10/24/2021) for medication follow up phone.    Beth Mood, MD, [facility], [facility] 09/12/2021, 11:16 AM

## 2021-09-13 ENCOUNTER — Encounter: Payer: Self-pay | Admitting: Physical Therapy

## 2021-09-13 ENCOUNTER — Ambulatory Visit: Payer: BC Managed Care – PPO | Admitting: Physical Therapy

## 2021-09-13 DIAGNOSIS — M6281 Muscle weakness (generalized): Secondary | ICD-10-CM

## 2021-09-13 DIAGNOSIS — R252 Cramp and spasm: Secondary | ICD-10-CM

## 2021-09-13 NOTE — Therapy (Addendum)
[facility] [address] [place], [place], [address] Phone: [phone]   Fax:  [phone]  Physical Therapy Treatment  Patient Details  Name: Beth [name] MRN: [id number] Date of Birth: [birth year] Referring Provider (PT): Hayden [place], [place]   Encounter Date: 09/13/2021   PT End of Session - 09/13/21 0758     Visit Number 7    Date for PT Re-Evaluation 09/28/21    Authorization Type BCBS    PT Start Time 0758    PT Stop Time 0836    PT Time Calculation (min) 38 min    Activity Tolerance Patient tolerated treatment well    Behavior During Therapy [facility] for tasks assessed/performed             Past Medical History:  Diagnosis Date   Acne    on face spirolactone for   Anal cancer ([place]) 02/25/2021   Constipation, chronic 08/08/2016   History of migraine    none in years   Wears glasses     Past Surgical History:  Procedure Laterality Date   ABDOMINAL HYSTERECTOMY  2016   partial   COLONOSCOPY     COLONOSCOPY WITH PROPOFOL N/A 01/28/2021   Procedure: COLONOSCOPY WITH PROPOFOL;  Surgeon: Virgel Manifold, MD;  Location: [facility];  Service: Endoscopy;  Laterality: N/A;   EXCISION HYDRADENITIS LABIA Left 02/25/2021   Procedure: EXCISION of  LABIAL LESIOM;  Surgeon: Michael Boston, MD;  Location: [place];  Service: General;  Laterality: Left;   IR FLUORO GUIDE CV LINE RIGHT  04/18/2021   RECTAL EXAM UNDER ANESTHESIA N/A 02/25/2021   Procedure: ANORECTAL EXAM UNDER ANESTHESIA;  Surgeon: Michael Boston, MD;  Location: [place];  Service: General;  Laterality: N/A;   SPHINCTEROTOMY N/A 08/30/2016   Procedure: LATERAL INTERNAL AND SPHINCTEROTOMY;  Surgeon: Michael Boston, MD;  [facility];  Service: General;  Laterality: N/A;   TONSILLECTOMY  as child   TRANSANAL EXCISION OF RECTAL MASS N/A 02/25/2021   Procedure: TRANSANAL EXCISIONAL  BIOPSY OF ANAL CANAL  MASS;  Surgeon: Michael Boston, MD;  Location: [place];  Service: General;  Laterality: N/A;    There were no vitals filed for this visit.   Subjective Assessment - 09/13/21 0811     Subjective Things are getting a little better and I think the exercises are helping.    Currently in Pain? No/denies                               [place] [facility] PT Treatment/Exercise - 09/13/21 0001       Exercises   Exercises Other Exercises    Other Exercises  educated and performed porgression of core strength and added to HEP - bird dips, squats with pallof, pallof press staggared and half kneeling      Lumbar Exercises: Stretches   Active Hamstring Stretch Right;Left;60 seconds    Other Lumbar Stretch Exercise hip flexor stretch - 60 bil      Lumbar Exercises: Aerobic   UBE (Upper Arm Bike) L1 4 min back - PT present for status update      Lumbar Exercises: Standing   Functional Squats 20 reps   pallof with blue band   Row Strengthening;Both;20 reps;Theraband    Theraband Level (Row) Level 4 (Blue)    Shoulder Extension Strengthening;Both;20 reps;Theraband    Theraband Level (Shoulder Extension) Level 4 (  Blue)      Lumbar Exercises: Seated   Other Seated Lumbar Exercises flexion with 3 lb weights on ball      Lumbar Exercises: Quadruped   Single Arm Raise Right;Left;10 reps    Other Quadruped Lumbar Exercises cat cow, child pose                       PT Short Term Goals - 08/17/21 1602       PT SHORT TERM GOAL #1   Title ind with initial HEP    Status Achieved               PT Long Term Goals - 09/13/21 4097       PT LONG TERM GOAL #1   Title Pt will be ind with HEP for pelvic health    Status Achieved      PT LONG TERM GOAL #2   Title Pt will report she is able to progress dilator use on her own    Status Achieved      PT LONG TERM GOAL #3   Title Pt will report at least 60% less pain with bowel and bladder emptying     Baseline 80% from when starting, still have to run to have a BM sometimes    Status Achieved      PT LONG TERM GOAL #4   Title Pt will report at least 50% less pain in her legs and glutes so she can start a walking routine for general health    Baseline 60% better worse when sitting a long time or walking too much or lifting into hip flexion in sitting especially    Status Achieved                   Plan - 09/13/21 0759     Clinical Impression Statement Today's session focused on HEP in order to successfully d/c with continued pain management.  Pt has noticed she is slowly getting better.  Pt will d/c from skilled PT today    Comorbidities hx of hysterectomy partial 2016; anal cancer 02/25/2021; history of migrain; radiation ended 04/27/21    PT Treatment/Interventions ADLs/Self Care Home Management;Biofeedback;Cryotherapy;Electrical Stimulation;Moist Heat;Therapeutic activities;Neuromuscular re-education;Therapeutic exercise;Patient/family education;Manual techniques;Passive range of motion;Dry needling    PT Next Visit Plan f/u on groin pain, add more core strength, continue internal STM if that helped last time    PT Home Exercise Plan Access Code: [email or web address]    Consulted and Agree with Plan of Care Patient             Patient will benefit from skilled therapeutic intervention in order to improve the following deficits and impairments:  Decreased endurance, Decreased range of motion, Increased fascial restricitons, Pain, Decreased coordination, Increased muscle spasms, Decreased strength  Visit Diagnosis: Cramp and spasm  Muscle weakness (generalized)     Problem List Patient Active Problem List   Diagnosis Date Noted   Anal squamous cell carcinoma ([place]) 03/01/2021   Squamous cell carcinoma of anal canal ([place]) 02/25/2021   History of migraine    Encounter for screening colonoscopy    Rectal polyp    Anal intraepithelial neoplasia III (AIN III) in anal tag s/p  excision 08/30/2016 10/07/2016   Tobacco abuse 08/08/2016   Chronic anal fissure s/p partial sphincterotomy 08/30/2016 08/08/2016   Constipation, chronic 08/08/2016    Camillo Flaming Katherinne Mofield, PT 09/13/2021, 8:43 AM  [facility] [address],  [place], [place], [address] Phone: [phone]   Fax:  [phone]  Name: Beth [name] MRN: [id number] Date of Birth: 1971/07/13  PHYSICAL THERAPY DISCHARGE SUMMARY  Visits from Start of Care: 7  Current functional level related to goals / functional outcomes: See above goals   Remaining deficits: See above details   Education / Equipment: HEP   Patient agrees to discharge. Patient goals were met. Patient is being discharged due to meeting the stated rehab goals.  Gustavus Bryant, PT 09/13/21 8:44 AM

## 2021-09-26 ENCOUNTER — Ambulatory Visit: Payer: Self-pay | Admitting: Surgery

## 2021-09-26 DIAGNOSIS — C210 Malignant neoplasm of anus, unspecified: Secondary | ICD-10-CM | POA: Diagnosis not present

## 2021-09-27 ENCOUNTER — Telehealth: Payer: Self-pay

## 2021-09-27 NOTE — Telephone Encounter (Signed)
-----   Message from Virgel Manifold, MD sent at 09/22/2021  1:27 PM EDT ----- Pt was supposed to have labs done for fatty liver after her last visit but never had these done. These are not urgent, but pt should follow up in clinic due to history of anal cancer and fatty liver in 3-6 months. Please call her to make an appt or set recall

## 2021-09-27 NOTE — Progress Notes (Addendum)
Anesthesia Review:  PCP: DR Gar Ponto  Cardiologist : none  Chest x-ray : EKG :09/29/21  Echo : Stress test: Cardiac Cath :  Activity level: can do a flgiht of stairs without difficulty  Sleep Study/ CPAP : none  Fasting Blood Sugar :      / Checks Blood Sugar -- times a day:   Blood Thinner/ Instructions /Last Dose: ASA / Instructions/ Last Dose :   At preop appt initial blood pressure in right arm was 123/101.  PT reported " I just had a hot flash".  PT denies any chest pain, dizziness or headache.  Recheck in rihgt arm was 124/4104.  Blood pressure in left arm was 134/96.  Ekg done at preop.   PT has bowel prep instructions from MD.   No covid test- ambulatory surgery.  I have called lab x 2 looking for results of BMP fro 09/29/2021.  Lab states machine is down.

## 2021-09-27 NOTE — Progress Notes (Signed)
DUE TO COVID-19 ONLY ONE VISITOR IS ALLOWED TO COME WITH YOU AND STAY IN THE WAITING ROOM ONLY DURING PRE OP AND PROCEDURE DAY OF SURGERY.  2 VISITOR  MAY VISIT WITH YOU AFTER SURGERY IN YOUR PRIVATE ROOM DURING VISITING HOURS ONLY!  YOU NEED TO HAVE A COVID 19 TEST ON______AME@_  @_from  8am-3pm _____, THIS TEST MUST BE DONE BEFORE SURGERY,  Covid test is done at [place], [place] [address].  This is a drive thru.  No appt required. Please see map.                 Your procedure is scheduled on:  09/30/2021   Report to [facility] Main  Entrance   Report to admitting at   1245PM     Call this number if you have problems the morning of surgery [phone]    REMEMBER: NO  SOLID FOOD CANDY OR GUM AFTER MIDNIGHT. CLEAR LIQUIDS UNTIL  1200 NOON          . NOTHING BY MOUTH EXCEPT CLEAR LIQUIDS UNTIL  1200 NOON   . PLEASE FINISH ENSURE DRINK PER SURGEON ORDER  WHICH NEEDS TO BE COMPLETED AT  1200 NOON     .      CLEAR LIQUID DIET   Foods Allowed                                                                    Coffee and tea, regular and decaf                            Fruit ices (not with fruit pulp)                                      Iced Popsicles                                    Carbonated beverages, regular and diet                                    Cranberry, grape and apple juices Sports drinks like Gatorade Lightly seasoned clear broth or consume(fat free) Sugar, honey syrup ___________________________________________________________________      BRUSH YOUR TEETH MORNING OF SURGERY AND RINSE YOUR MOUTH OUT, NO CHEWING GUM CANDY OR MINTS.     Take these medicines the morning of surgery with A SIP OF WATER:   GABAPENTIN   DO NOT TAKE ANY DIABETIC MEDICATIONS DAY OF YOUR SURGERY                               You may not have any metal on your body including hair pins and              piercings  Do not wear jewelry, make-up, lotions, powders  or perfumes, deodorant  Do not wear nail polish on your fingernails.  Do not shave  48 hours prior to surgery.              Men may shave face and neck.   Do not bring valuables to the hospital. [place].  Contacts, dentures or bridgework may not be worn into surgery.  Leave suitcase in the car. After surgery it may be brought to your room.     Patients discharged the day of surgery will not be allowed to drive home. IF YOU ARE HAVING SURGERY AND GOING HOME THE SAME DAY, YOU MUST HAVE AN ADULT TO DRIVE YOU HOME AND BE WITH YOU FOR 24 HOURS. YOU MAY GO HOME BY TAXI OR UBER OR ORTHERWISE, BUT AN ADULT MUST ACCOMPANY YOU HOME AND STAY WITH YOU FOR 24 HOURS.  Name and phone number of your driver:  Special Instructions: N/A              Please read over the following fact sheets you were given: _____________________________________________________________________  [facility] - Preparing for Surgery Before surgery, you can play an important role.  Because skin is not sterile, your skin needs to be as free of germs as possible.  You can reduce the number of germs on your skin by washing with CHG (chlorahexidine gluconate) soap before surgery.  CHG is an antiseptic cleaner which kills germs and bonds with the skin to continue killing germs even after washing. Please DO NOT use if you have an allergy to CHG or antibacterial soaps.  If your skin becomes reddened/irritated stop using the CHG and inform your nurse when you arrive at Short Stay. Do not shave (including legs and underarms) for at least 48 hours prior to the first CHG shower.  You may shave your face/neck. Please follow these instructions carefully:  1.  Shower with CHG Soap the night before surgery and the  morning of Surgery.  2.  If you choose to wash your hair, wash your hair first as usual with your  normal  shampoo.  3.  After you shampoo, rinse your hair and body  thoroughly to remove the  shampoo.                           4.  Use CHG as you would any other liquid soap.  You can apply chg directly  to the skin and wash                       Gently with a scrungie or clean washcloth.  5.  Apply the CHG Soap to your body ONLY FROM THE NECK DOWN.   Do not use on face/ open                           Wound or open sores. Avoid contact with eyes, ears mouth and genitals (private parts).                       Wash face,  Genitals (private parts) with your normal soap.             6.  Wash thoroughly, paying special attention to the area where your surgery  will be performed.  7.  Thoroughly rinse your body with  warm water from the neck down.  8.  DO NOT shower/wash with your normal soap after using and rinsing off  the CHG Soap.                9.  Pat yourself dry with a clean towel.            10.  Wear clean pajamas.            11.  Place clean sheets on your bed the night of your first shower and do not  sleep with pets. Day of Surgery : Do not apply any lotions/deodorants the morning of surgery.  Please wear clean clothes to the hospital/surgery center.  FAILURE TO FOLLOW THESE INSTRUCTIONS MAY RESULT IN THE CANCELLATION OF YOUR SURGERY PATIENT SIGNATURE_________________________________  NURSE SIGNATURE__________________________________  ________________________________________________________________________

## 2021-09-29 ENCOUNTER — Other Ambulatory Visit: Payer: Self-pay

## 2021-09-29 ENCOUNTER — Encounter (HOSPITAL_COMMUNITY)
Admission: RE | Admit: 2021-09-29 | Discharge: 2021-09-29 | Disposition: A | Discharge status: Home / Self Care | Payer: BC Managed Care – PPO | Source: Ambulatory Visit | Attending: Surgery | Admitting: Surgery

## 2021-09-29 ENCOUNTER — Encounter (HOSPITAL_COMMUNITY): Payer: Self-pay

## 2021-09-29 DIAGNOSIS — Z85048 Personal history of other malignant neoplasm of rectum, rectosigmoid junction, and anus: Secondary | ICD-10-CM | POA: Insufficient documentation

## 2021-09-29 DIAGNOSIS — Z87891 Personal history of nicotine dependence: Secondary | ICD-10-CM | POA: Diagnosis not present

## 2021-09-29 DIAGNOSIS — Z881 Allergy status to other antibiotic agents status: Secondary | ICD-10-CM | POA: Diagnosis not present

## 2021-09-29 DIAGNOSIS — Z923 Personal history of irradiation: Secondary | ICD-10-CM | POA: Diagnosis not present

## 2021-09-29 DIAGNOSIS — Z9221 Personal history of antineoplastic chemotherapy: Secondary | ICD-10-CM | POA: Diagnosis not present

## 2021-09-29 DIAGNOSIS — Z882 Allergy status to sulfonamides status: Secondary | ICD-10-CM | POA: Diagnosis not present

## 2021-09-29 DIAGNOSIS — K629 Disease of anus and rectum, unspecified: Secondary | ICD-10-CM | POA: Insufficient documentation

## 2021-09-29 DIAGNOSIS — C211 Malignant neoplasm of anal canal: Secondary | ICD-10-CM | POA: Diagnosis not present

## 2021-09-29 DIAGNOSIS — Z79899 Other long term (current) drug therapy: Secondary | ICD-10-CM | POA: Diagnosis not present

## 2021-09-29 DIAGNOSIS — Z01818 Encounter for other preprocedural examination: Secondary | ICD-10-CM | POA: Insufficient documentation

## 2021-09-29 LAB — BASIC METABOLIC PANEL
Anion gap: 11 (ref 5–15)
BUN: 13 mg/dL (ref 6–20)
CO2: 24 mmol/L (ref 22–32)
Calcium: 9.7 mg/dL (ref 8.9–10.3)
Chloride: 105 mmol/L (ref 98–111)
Creatinine, Ser: 0.6 mg/dL (ref 0.44–1.00)
GFR, Estimated: >60 mL/min (ref >60)
Glucose, Bld: 108 mg/dL — ABNORMAL HIGH (ref 70–99)
Potassium: 4.2 mmol/L (ref 3.5–5.1)
Sodium: 140 mmol/L (ref 135–145)

## 2021-09-29 LAB — CBC
HCT: 40.6 % (ref 36.0–46.0)
Hemoglobin: 13.4 g/dL (ref 12.0–15.0)
MCH: 34.3 pg — ABNORMAL HIGH (ref 26.0–34.0)
MCHC: 33 g/dL (ref 30.0–36.0)
MCV: 103.8 fL — ABNORMAL HIGH (ref 80.0–100.0)
Platelets: 150 10*3/uL (ref 150–400)
RBC: 3.91 MIL/uL (ref 3.87–5.11)
RDW: 12.3 % (ref 11.5–15.5)
WBC: 5.1 10*3/uL (ref 4.0–10.5)
nRBC: 0 % (ref 0.0–0.2)

## 2021-09-29 NOTE — Progress Notes (Signed)
Anesthesia Chart Review   Case: [id number] Date/Time: 09/30/21 1445   Procedures:      ANORECTAL MASS BIOPSY     ANORECTAL EXAMINATION UNDER ANESTHESIA - GEN & LOCAL ANESTHESIA   Anesthesia type: General   Pre-op diagnosis: RECURRENT ANAL RECTAL MASS. HISTORY OF ANAL CANCER   Location: [facility] ROOM 09 / [facility]   Surgeons: Michael Boston, MD       DISCUSSION:50 y.o. former smoker with h/o anal cancer with recurrent anal rectal mass scheduled for above procedure 09/30/2021 with Dr. Michael Boston.   BMP pending, labs reports machine is down.  May need labs repeated DOS, evaluate DOS.   Anticipate pt can proceed with planned procedure barring acute status change.   VS: BP (!) 134/96   Pulse 81   Temp 36.8 C (Oral)   Resp 16   Ht 5\' 7"  (1.702 m)   Wt 69.9 kg   LMP 03/13/2012   SpO2 100%   BMI 24.12 kg/m   PROVIDERS: Caryl Bis, MD is PCP    LABS: Labs reviewed: Acceptable for surgery. (all labs ordered are listed, but only abnormal results are displayed)  Labs Reviewed  CBC - Abnormal; Notable for the following components:      Result Value   MCV 103.8 (*)    MCH 34.3 (*)    All other components within normal limits  BASIC METABOLIC PANEL     IMAGES:   EKG: 09/29/2021 Rate 89 bpm  NSR  CV:  Past Medical History:  Diagnosis Date   Acne    on face spirolactone for   Anal cancer ([place]) 02/25/2021   Constipation, chronic 08/08/2016   History of migraine    none in years   Wears glasses     Past Surgical History:  Procedure Laterality Date   ABDOMINAL HYSTERECTOMY  2016   partial   COLONOSCOPY     COLONOSCOPY WITH PROPOFOL N/A 01/28/2021   Procedure: COLONOSCOPY WITH PROPOFOL;  Surgeon: Virgel Manifold, MD;  Location: [facility];  Service: Endoscopy;  Laterality: N/A;   EXCISION HYDRADENITIS LABIA Left 02/25/2021   Procedure: EXCISION of  LABIAL LESIOM;  Surgeon: Michael Boston, MD;  Location: [place];  Service: General;   Laterality: Left;   IR FLUORO GUIDE CV LINE RIGHT  04/18/2021   RECTAL EXAM UNDER ANESTHESIA N/A 02/25/2021   Procedure: ANORECTAL EXAM UNDER ANESTHESIA;  Surgeon: Michael Boston, MD;  Location: [place];  Service: General;  Laterality: N/A;   SPHINCTEROTOMY N/A 08/30/2016   Procedure: LATERAL INTERNAL AND SPHINCTEROTOMY;  Surgeon: Michael Boston, MD;  [facility];  Service: General;  Laterality: N/A;   TONSILLECTOMY  as child   TRANSANAL EXCISION OF RECTAL MASS N/A 02/25/2021   Procedure: TRANSANAL EXCISIONAL  BIOPSY OF ANAL CANAL MASS;  Surgeon: Michael Boston, MD;  Location: [place];  Service: General;  Laterality: N/A;    MEDICATIONS:  cetirizine (ZYRTEC) 10 MG tablet   gabapentin (NEURONTIN) 300 MG capsule   oxyCODONE (OXY IR/ROXICODONE) 5 MG immediate release tablet   spironolactone (ALDACTONE) 100 MG tablet   triamcinolone cream (KENALOG) 0.1 %   valACYclovir (VALTREX) 1000 MG tablet   No current facility-administered medications for this encounter.    Konrad Felix Ward, PA-C [facility]Surgical Testing [phone]

## 2021-09-30 ENCOUNTER — Ambulatory Visit (HOSPITAL_COMMUNITY): Payer: BC Managed Care – PPO | Admitting: Certified Registered Nurse Anesthetist

## 2021-09-30 ENCOUNTER — Encounter (HOSPITAL_COMMUNITY)
Admission: RE | Disposition: A | Discharge status: Home / Self Care | Payer: Self-pay | Source: Home / Self Care | Attending: Surgery

## 2021-09-30 ENCOUNTER — Encounter (HOSPITAL_COMMUNITY): Payer: Self-pay | Admitting: Surgery

## 2021-09-30 ENCOUNTER — Ambulatory Visit (HOSPITAL_COMMUNITY)
Admission: RE | Admit: 2021-09-30 | Discharge: 2021-09-30 | Disposition: A | Discharge status: Home / Self Care | Payer: BC Managed Care – PPO | Attending: Surgery | Admitting: Surgery

## 2021-09-30 ENCOUNTER — Ambulatory Visit (HOSPITAL_COMMUNITY): Payer: BC Managed Care – PPO | Admitting: Physician Assistant

## 2021-09-30 DIAGNOSIS — C210 Malignant neoplasm of anus, unspecified: Secondary | ICD-10-CM

## 2021-09-30 DIAGNOSIS — Z881 Allergy status to other antibiotic agents status: Secondary | ICD-10-CM | POA: Insufficient documentation

## 2021-09-30 DIAGNOSIS — K6289 Other specified diseases of anus and rectum: Secondary | ICD-10-CM | POA: Diagnosis not present

## 2021-09-30 DIAGNOSIS — L709 Acne, unspecified: Secondary | ICD-10-CM | POA: Diagnosis not present

## 2021-09-30 DIAGNOSIS — C211 Malignant neoplasm of anal canal: Secondary | ICD-10-CM | POA: Insufficient documentation

## 2021-09-30 DIAGNOSIS — Z87891 Personal history of nicotine dependence: Secondary | ICD-10-CM | POA: Diagnosis not present

## 2021-09-30 DIAGNOSIS — Z79899 Other long term (current) drug therapy: Secondary | ICD-10-CM | POA: Diagnosis not present

## 2021-09-30 DIAGNOSIS — Z9221 Personal history of antineoplastic chemotherapy: Secondary | ICD-10-CM | POA: Diagnosis not present

## 2021-09-30 DIAGNOSIS — C20 Malignant neoplasm of rectum: Secondary | ICD-10-CM | POA: Diagnosis not present

## 2021-09-30 DIAGNOSIS — Z882 Allergy status to sulfonamides status: Secondary | ICD-10-CM | POA: Diagnosis not present

## 2021-09-30 DIAGNOSIS — Z923 Personal history of irradiation: Secondary | ICD-10-CM | POA: Diagnosis not present

## 2021-09-30 DIAGNOSIS — Z85048 Personal history of other malignant neoplasm of rectum, rectosigmoid junction, and anus: Secondary | ICD-10-CM | POA: Diagnosis not present

## 2021-09-30 HISTORY — PX: RECTAL BIOPSY: SHX2303

## 2021-09-30 HISTORY — PX: RECTAL EXAM UNDER ANESTHESIA: SHX6399

## 2021-09-30 SURGERY — BIOPSY, RECTUM
Anesthesia: General | Site: Rectum

## 2021-09-30 MED ORDER — LIDOCAINE 2% (20 MG/ML) 5 ML SYRINGE
INTRAMUSCULAR | Status: DC | PRN
  Administered 2021-09-30: 80 mg via INTRAVENOUS

## 2021-09-30 MED ORDER — BUPIVACAINE-EPINEPHRINE 0.25% -1:200000 IJ SOLN
INTRAMUSCULAR | Status: DC | PRN
  Administered 2021-09-30: 20 mL

## 2021-09-30 MED ORDER — OXYCODONE HCL 5 MG PO TABS: 5.0000 mg | ORAL_TABLET | Freq: Once | ORAL | Status: DC | PRN

## 2021-09-30 MED ORDER — FENTANYL CITRATE (PF) 100 MCG/2ML IJ SOLN
INTRAMUSCULAR | Status: AC
  Filled 2021-09-30: qty 2

## 2021-09-30 MED ORDER — BUPIVACAINE LIPOSOME 1.3 % IJ SUSP: 20.0000 mL | Freq: Once | INTRAMUSCULAR | Status: DC

## 2021-09-30 MED ORDER — ONDANSETRON HCL 4 MG/2ML IJ SOLN
INTRAMUSCULAR | Status: DC | PRN
  Administered 2021-09-30: 4 mg via INTRAVENOUS

## 2021-09-30 MED ORDER — CHLORHEXIDINE GLUCONATE CLOTH 2 % EX PADS: 6.0000 | MEDICATED_PAD | Freq: Once | CUTANEOUS | Status: DC

## 2021-09-30 MED ORDER — DEXAMETHASONE SODIUM PHOSPHATE 4 MG/ML IJ SOLN
INTRAMUSCULAR | Status: DC | PRN
Start: 2021-09-30 — End: 2021-09-30
  Administered 2021-09-30: 10 mg via INTRAVENOUS

## 2021-09-30 MED ORDER — CHLORHEXIDINE GLUCONATE 0.12 % MT SOLN
15.0000 mL | Freq: Once | OROMUCOSAL | Status: AC
  Administered 2021-09-30: 15 mL via OROMUCOSAL

## 2021-09-30 MED ORDER — OXYCODONE HCL 5 MG/5ML PO SOLN: 5.0000 mg | Freq: Once | ORAL | Status: DC | PRN

## 2021-09-30 MED ORDER — FENTANYL CITRATE PF 50 MCG/ML IJ SOSY
25.0000 ug | PREFILLED_SYRINGE | INTRAMUSCULAR | Status: DC | PRN
  Administered 2021-09-30 (×3): 50 ug via INTRAVENOUS

## 2021-09-30 MED ORDER — FENTANYL CITRATE (PF) 100 MCG/2ML IJ SOLN
INTRAMUSCULAR | Status: DC | PRN
  Administered 2021-09-30 (×2): 50 ug via INTRAVENOUS
  Administered 2021-09-30 (×2): 25 ug via INTRAVENOUS
  Administered 2021-09-30: 50 ug via INTRAVENOUS

## 2021-09-30 MED ORDER — SUCCINYLCHOLINE CHLORIDE 200 MG/10ML IV SOSY
PREFILLED_SYRINGE | INTRAVENOUS | Status: DC | PRN
  Administered 2021-09-30: 100 mg via INTRAVENOUS

## 2021-09-30 MED ORDER — DEXAMETHASONE SODIUM PHOSPHATE 10 MG/ML IJ SOLN
INTRAMUSCULAR | Status: AC
  Filled 2021-09-30: qty 1

## 2021-09-30 MED ORDER — ENSURE PRE-SURGERY PO LIQD: 296.0000 mL | Freq: Once | ORAL | Status: DC

## 2021-09-30 MED ORDER — BUPIVACAINE LIPOSOME 1.3 % IJ SUSP
INTRAMUSCULAR | Status: AC
  Filled 2021-09-30: qty 20

## 2021-09-30 MED ORDER — PROPOFOL 10 MG/ML IV BOLUS
INTRAVENOUS | Status: DC | PRN
  Administered 2021-09-30: 130 mg via INTRAVENOUS
  Administered 2021-09-30: 150 mg via INTRAVENOUS
  Administered 2021-09-30: 50 mg via INTRAVENOUS

## 2021-09-30 MED ORDER — ONDANSETRON HCL 4 MG/2ML IJ SOLN
INTRAMUSCULAR | Status: AC
  Filled 2021-09-30: qty 2

## 2021-09-30 MED ORDER — LACTATED RINGERS IV SOLN: INTRAVENOUS | Status: DC

## 2021-09-30 MED ORDER — BUPIVACAINE LIPOSOME 1.3 % IJ SUSP
INTRAMUSCULAR | Status: DC | PRN
  Administered 2021-09-30: 20 mL

## 2021-09-30 MED ORDER — DIBUCAINE (PERIANAL) 1 % EX OINT
TOPICAL_OINTMENT | CUTANEOUS | Status: AC
  Filled 2021-09-30: qty 28

## 2021-09-30 MED ORDER — PROPOFOL 10 MG/ML IV BOLUS
INTRAVENOUS | Status: AC
  Filled 2021-09-30: qty 20

## 2021-09-30 MED ORDER — SODIUM CHLORIDE 0.9 % IV SOLN
2.0000 g | INTRAVENOUS | Status: AC
  Administered 2021-09-30: 2 g via INTRAVENOUS

## 2021-09-30 MED ORDER — ACETAMINOPHEN 500 MG PO TABS
1000.0000 mg | ORAL_TABLET | ORAL | Status: AC
  Administered 2021-09-30: 1000 mg via ORAL
  Filled 2021-09-30: qty 2

## 2021-09-30 MED ORDER — CELECOXIB 200 MG PO CAPS
200.0000 mg | ORAL_CAPSULE | ORAL | Status: AC
  Administered 2021-09-30: 200 mg via ORAL
  Filled 2021-09-30: qty 1

## 2021-09-30 MED ORDER — GABAPENTIN 300 MG PO CAPS
300.0000 mg | ORAL_CAPSULE | ORAL | Status: DC
  Filled 2021-09-30: qty 1

## 2021-09-30 MED ORDER — FENTANYL CITRATE PF 50 MCG/ML IJ SOSY
PREFILLED_SYRINGE | INTRAMUSCULAR | Status: AC
  Filled 2021-09-30: qty 3

## 2021-09-30 MED ORDER — SODIUM CHLORIDE 0.9 % IV SOLN
INTRAVENOUS | Status: AC
  Filled 2021-09-30: qty 20

## 2021-09-30 MED ORDER — MIDAZOLAM HCL 5 MG/5ML IJ SOLN
INTRAMUSCULAR | Status: DC | PRN
  Administered 2021-09-30: 2 mg via INTRAVENOUS

## 2021-09-30 MED ORDER — ACETAMINOPHEN 500 MG PO TABS: 1000.0000 mg | ORAL_TABLET | Freq: Once | ORAL | Status: DC

## 2021-09-30 MED ORDER — SODIUM CHLORIDE 0.9 % IV SOLN: 2.0000 g | INTRAVENOUS | Status: DC

## 2021-09-30 MED ORDER — MIDAZOLAM HCL 2 MG/2ML IJ SOLN
INTRAMUSCULAR | Status: AC
  Filled 2021-09-30: qty 2

## 2021-09-30 MED ORDER — OXYCODONE HCL 5 MG PO TABS: 5.0000 mg | ORAL_TABLET | Freq: Four times a day (QID) | ORAL | 0 refills | Status: DC | PRN

## 2021-09-30 SURGICAL SUPPLY — 42 items
APL SKNCLS STERI-STRIP NONHPOA (GAUZE/BANDAGES/DRESSINGS) ×1
BAG COUNTER SPONGE SURGICOUNT (BAG) IMPLANT
BAG SPNG CNTER NS LX DISP (BAG)
BENZOIN TINCTURE PRP APPL 2/3 (GAUZE/BANDAGES/DRESSINGS) ×2 IMPLANT
BLADE SURG 15 STRL LF DISP TIS (BLADE) ×1 IMPLANT
BLADE SURG 15 STRL SS (BLADE) ×2
CNTNR URN SCR LID CUP LEK RST (MISCELLANEOUS) ×1 IMPLANT
CONT SPEC 4OZ STRL OR WHT (MISCELLANEOUS) ×2
COVER SURGICAL LIGHT HANDLE (MISCELLANEOUS) ×2 IMPLANT
DECANTER SPIKE VIAL GLASS SM (MISCELLANEOUS) ×2 IMPLANT
DRAPE LAPAROTOMY T 102X78X121 (DRAPES) ×2 IMPLANT
DRSG PAD ABDOMINAL 8X10 ST (GAUZE/BANDAGES/DRESSINGS) ×2 IMPLANT
ELECT REM PT RETURN 15FT ADLT (MISCELLANEOUS) ×2 IMPLANT
GAUZE 4X4 16PLY ~~LOC~~+RFID DBL (SPONGE) ×2 IMPLANT
GAUZE SPONGE 4X4 12PLY STRL (GAUZE/BANDAGES/DRESSINGS) ×2 IMPLANT
GLOVE SURG NEOPR MICRO LF SZ8 (GLOVE) ×2 IMPLANT
GLOVE SURG UNDER LTX SZ8 (GLOVE) ×2 IMPLANT
GOWN STRL REUS W/TWL XL LVL3 (GOWN DISPOSABLE) ×4 IMPLANT
KIT BASIN OR (CUSTOM PROCEDURE TRAY) ×2 IMPLANT
KIT TURNOVER KIT A (KITS) ×2 IMPLANT
LOOP VESSEL MAXI BLUE (MISCELLANEOUS) IMPLANT
NEEDLE HYPO 22GX1.5 SAFETY (NEEDLE) ×2 IMPLANT
PACK BASIC VI WITH GOWN DISP (CUSTOM PROCEDURE TRAY) ×2 IMPLANT
PANTS MESH DISP LRG (UNDERPADS AND DIAPERS) ×1 IMPLANT
PANTS MESH DISPOSABLE L (UNDERPADS AND DIAPERS) ×1
PENCIL SMOKE EVACUATOR (MISCELLANEOUS) IMPLANT
SHEARS HARMONIC 9CM CVD (BLADE) IMPLANT
SUCTION FRAZIER HANDLE 12FR (TUBING)
SUCTION TUBE FRAZIER 12FR DISP (TUBING) IMPLANT
SURGILUBE 2OZ TUBE FLIPTOP (MISCELLANEOUS) ×2 IMPLANT
SUT CHROMIC 2 0 SH (SUTURE) ×2 IMPLANT
SUT CHROMIC 3 0 SH 27 (SUTURE) IMPLANT
SUT VIC AB 2-0 SH 27 (SUTURE)
SUT VIC AB 2-0 SH 27X BRD (SUTURE) IMPLANT
SUT VIC AB 2-0 UR6 27 (SUTURE) ×12 IMPLANT
SWAB COLLECTION DEVICE MRSA (MISCELLANEOUS) IMPLANT
SWAB CULTURE ESWAB REG 1ML (MISCELLANEOUS) IMPLANT
SYR 20ML LL LF (SYRINGE) ×2 IMPLANT
SYR 3ML LL SCALE MARK (SYRINGE) IMPLANT
SYR BULB IRRIG 60ML STRL (SYRINGE) IMPLANT
TOWEL OR 17X26 10 PK STRL BLUE (TOWEL DISPOSABLE) ×2 IMPLANT
TOWEL OR NON WOVEN STRL DISP B (DISPOSABLE) ×2 IMPLANT

## 2021-09-30 NOTE — Anesthesia Preprocedure Evaluation (Addendum)
Anesthesia Evaluation  Patient identified by MRN, date of birth, ID band Patient awake    Reviewed: Allergy & Precautions, NPO status , Patient's Chart, lab work & pertinent test results  History of Anesthesia Complications Negative for: history of anesthetic complications  Airway Mallampati: II  TM Distance: >3 FB Neck ROM: Full    Dental  (+) Dental Advisory Given, Teeth Intact   Pulmonary neg shortness of breath, neg sleep apnea, neg COPD, neg recent URI, former smoker,    breath sounds clear to auscultation       Cardiovascular negative cardio ROS   Rhythm:Regular     Neuro/Psych negative neurological ROS  negative psych ROS   GI/Hepatic Neg liver ROS, RECURRENT ANAL RECTAL MASS. HISTORY OF ANAL CANCER   Endo/Other  negative endocrine ROS  Renal/GU negative Renal ROS     Musculoskeletal negative musculoskeletal ROS (+)   Abdominal   Peds  Hematology negative hematology ROS (+)   Anesthesia Other Findings   Reproductive/Obstetrics                            Anesthesia Physical Anesthesia Plan  ASA: 2  Anesthesia Plan: General   Post-op Pain Management:    Induction: Intravenous  PONV Risk Score and Plan: 3 and Ondansetron and Dexamethasone  Airway Management Planned: LMA and Oral ETT  Additional Equipment: None  Intra-op Plan:   Post-operative Plan: Extubation in OR  Informed Consent: I have reviewed the patients History and Physical, chart, labs and discussed the procedure including the risks, benefits and alternatives for the proposed anesthesia with the patient or authorized representative who has indicated his/her understanding and acceptance.     Dental advisory given  Plan Discussed with: CRNA and Anesthesiologist  Anesthesia Plan Comments:         Anesthesia Quick Evaluation

## 2021-09-30 NOTE — Telephone Encounter (Signed)
Pt was seen by AMS 09/13/21.

## 2021-09-30 NOTE — H&P (Signed)
09/30/2021   ER: Hollace Kinnier, MD  Patient Care Team: Caryl Bis, MD as PCP - General (Family Medicine) Johney Maine, Adrian Saran, MD as Consulting Provider (General Surgery) Marye Round, MD (Radiation Oncology) Vonda Antigua, MD (Gastroenterology) Benay Spice Newman Nickels, MD (Hematology and Oncology)  [facility] MRN: [id number] DOB: 01-22-1971 DATE OF ENCOUNTER: 09/26/2021 Interval History:   The patient returns to the office after undergoing  Prior excision of AIN III, 07/2016-09/2016 (Lost to follow-up) Anorectal examination under anesthesia with biopsy of anal mass 02/25/2021 Pathology consistent with squamous cell carcinoma anus with some neuroendocrine features  modified nigro protocol for mixed high-grade neuroendocrine squamous cell carcinoma. - completed 04/27/2021  Last seen in August at the 51-monthfollow-up. No evidence of recurrence. Patient underwent some pelvic floor physical therapy. Finished that in September. She noted last week she started having some bleeding. It has not gone away. It scared her. We moved to her November - 339-monthollow-up appointment to now. No fevers or chills. Appetite okay. Takes MiraLAX every morning with her coffee to move her bowels a few times a day. No fevers or chills. Appetites been okay. Denies any major pain  Oncological history so far: 1. Squamous cell carcinoma of the anal canal ? Colonoscopy 01/28/2021-rectal mass extending to the "outside ", palpable on exam-biopsy revealed high-grade squamous intraepithelial lesion (AIN 3/carcinoma in situ) ? 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 CTs 03/07/2021-ill-definition of tissue planes along the anus with abnormal soft tissue prominence posteriorly along the anal canal suspicious for mass.  Small adjacent lower perirectal lymph nodes in addition to a 0.5 cm sacral node.  Focal enhancing lesion in the right hepatic lobe 1.2 x 0.8 x 0.9 cm.  2.1 x 1.5 cm cystic lesion right ovary with thin enhancing rim. ? PET scan 03/16/2021-hypermetabolic mass within the anal canal.  No definite evidence of local or distant metastatic disease.  7 mm left submandibular lymph node with mild FDG uptake, likely reactive.  Focal site of FDG uptake within the inner quadrant of the right breast.  03/21/2021 through 04/27/2021 Site Technique Total Dose (Gy) Dose per Fx (Gy) Completed Fx Beam Energies  Anus: Anus IMRT 50.4/50.4 1.8 28/28 6X     Labs, Imaging and Diagnostic Testing:  Located in 'CStowellsection of Epic EMR chart  PRIOR NOTES   [facility]: [facility]21 9[facility]15 AM [facility] Patient #: [id number]: 7/May 19, 1972arried / Language: EnCleophus Molt Race: White Female  History of Present Illness (SAdin HectorD; 04/25/2021 9:55 AM) The patient is a [age]ear old female who presents with anal lesions. Note for "Anal lesions": ` ` ` The patient returns s/p anorectal examination under anesthesia with biopsy of anal mass 02/25/2021  Pathology consistent with squamous cell carcinoma anus with some neuroendocrine features.  Oncological history so far: 1. Squamous cell carcinoma of the anal canal ? Colonoscopy 01/28/2021-rectal mass extending to the "outside ", palpable on exam-biopsy revealed high-grade squamous intraepithelial lesion (AIN 3/carcinoma in situ) ? Exam under anesthesia with transanal excision of an anal canal mass 02/25/2021-frozen section consistent with squamous cell carcinoma, 4 x 4 centimeter sessile  anal canal mass extending  to the anal verge, right lateral and posterior, 30% circumferential, fixed to the anal sphincter complex; anal rectal mass with mixed high-grade neuroendocrine squamous cell carcinoma; posterior anal tag with squamous cell carcinoma; left labial lesion-condyloma acuminata ? CTs 03/07/2021-ill-definition of tissue planes along the anus with abnormal soft tissue prominence posteriorly along the anal canal suspicious for mass.  Small adjacent lower perirectal lymph nodes in addition to a 0.5 cm sacral node.  Focal enhancing lesion in the right hepatic lobe 1.2 x 0.8 x 0.9 cm.  2.1 x 1.5 cm cystic lesion right ovary with thin enhancing rim. ? PET scan 03/16/2021-hypermetabolic mass within the anal canal.  No definite evidence of local or distant metastatic disease.  7 mm left submandibular lymph node with mild FDG uptake, likely reactive.  Focal site of FDG uptake within the inner quadrant of the right breast. ? Radiation 03/21/2021 ? Cycle one 5-FU/mitomycin-C 03/21/2021 ? Cycle two 5-FU/Mitomycin-C 04/18/2021, 5-FU dose reduced secondary to mucositis  The patient returns to clinic after surgery, gradually improving.  PET scan showed a breast mass.  She evaluation for a breast biopsy.  Biopsy showed fibrocystic changes.  She's been started on modified Nigro protocol starting March 21, 2021.  She is due to complete blood count this coming week.  She is very tender and sore doesn't want to be examined.  Denies nausea, constipation/diarrhea, worsening fatigue, high fevers, or other concerns.  She has about 1 bowel movement a day but it is watery.  They discussed about using Imodium.  She was hesitant to do any topical therapy since she recalls being told the cancer center that would affect treatment.  She was concerned about the diagnosis of cancer.  She was lost to follow-up but seemed confused that she thought I had set as needed.  She's tried to think forward for now.  Getting pain medications through the cancer  center. ` ###########################################  Pathology: Diagnosis Breast, right, needle core biopsy, upper, inner - FIBROCYSTIC CHANGES WITH SCLEROSING ADENOSIS. - SMALL FOCUS OF INFLAMMATION AND FIBROSIS SUGGESTIVE OF RESOLVING FAT NECROSIS. - PSEUDOANGIOMATOUS STROMAL HYPERPLASIA (PASH) - NO EVIDENCE OF MALIGNANCY. Microscopic Comment Called to the [facility] on 04/12/21. (JDP:kh 04/12/21) Claudette Laws MD Pathologist, Electronic Signature (Case signed 04/12/2021) Specimen Krysten Veronica and Clinical Information Specimen Comment TIF: 8:45 am, CIT: < 2 minutes, subcentimeter enhancing mass, indeterminate, recently diagnosed anal cancer, breast mass seen on PET Specimen(s) Obtained: Breast, right, needle core biopsy, upper, inner Specimen Clinical Information R/o atypia or malignancy Arbie Blankley Received in formalin labeled with the patient's name (Flavia Friley) and "Rt UIQ" is a 2.0 x 1.8 x 0.2 cm aggregate of yellow-tan fibrofatty tissue, submitted in toto in a single cassette(s). TIF 1601, CIT <2 minutes. (LF, 04/11/21) Report signed out from the following location(s) Technical Component was performed at [facility]. [address],[place] 1 of 2 FINAL for SYNAI, PRETTYMAN [phone]) Report signed out from the following location(s)(continued) [phone].KYHC:[id number],VVO:[id number]., Interpretation was performed at [facility] [address], [place], [place] [address]. CLIA #: [id number], 2 of 2` ` ###########################################`  02/25/2021  11:59 AM  PATIENT:  Beth [name]  50 y.o. female  Patient Care Team: Caryl Bis, MD as PCP - General (Unknown Physician Specialty) Michael Boston, MD as Consulting Physician (General Surgery) Wonda Horner, MD as Consulting Physician (Gastroenterology) [name], MD as Referring Physician (Obstetrics and Gynecology) Virgel Manifold, MD as  Consulting Physician (Gastroenterology)  PRE-OPERATIVE DIAGNOSIS:  ANAL CANAL MASS WITH AIN III  POST-OPERATIVE DIAGNOSIS:   Squamous Cell Cancer of the Anal Canal Labial skin mass, probable Condyloma  PROCEDURE:   ANORECTAL EXAM UNDER ANESTHESIA TRANSANAL EXCISIONAL  BIOPSY OF ANAL CANAL MASS EXCISION of  LABIAL LESION  SURGEON:  [name], MD  ANESTHESIA:  General Anorectal & Local field block (0.25% bupivacaine with epinephrine mixed with Liposomal bupivacaine (Experel)  EBL:  Total I/O In: 900 [I.V.:900] Out: 10 [Blood:10].  See operative record  Delay start of Pharmacological VTE agent (>24hrs) due to surgical blood loss or risk of bleeding:  NO  DRAINS: NONE  SPECIMEN:    -Posterior anal canal ulcerated mass with partial excision for biopsy -Posterior midline anal tag.  Most likely sentinel tag -Left external lateral labial skin tag.  Acrochordon versus condyloma  DISPOSITION OF SPECIMEN:  PATHOLOGY  COUNTS:  YES  PLAN OF CARE: Discharge home after PACU  PATIENT DISPOSITION:  PACU - hemodynamically stable.  INDICATION: Pleasant patient with history of anal intraepithelial neoplasia status post excision and treatment in 2017.  Lost to follow-up.  And recurrent swelling and concern.  Had colonoscopy which raised concern of recurrent anal canal mass.  Biopsy showing at least anal intraepithelial neoplasia.  Surgical consultation requested.  Patient had discomfort so examination limited in the office.  I recommended anorectal examination under anesthesia with removal and/or biopsy.    The anatomy & physiology of the anorectal region was discussed.  The pathophysiology of anorectal warts and differential diagnosis was discussed.  Natural history risks without surgery was discussed such as further growth and cancer.   I stressed the importance of office follow-up to catch early recurrence & minimize/halt progression of disease.  Interventions such as cauterization by  topical agents were discussed.  The patient's symptoms are not adequately controlled by non-operative treatments.  I feel the risks & problems of no surgery outweigh the operative risks; therefore, I recommended surgery to treat the anal warts by removal, ablation and/or cauterization.  Risks such as bleeding, infection, need for further treatment, Risks of bleeding, infection, injury to other organs, need for repair of tissues / organs, reoperation, heart attack, death, and other risks were discussed.   I noted a good likelihood this will help address the problem. Goals of post-operative recovery were discussed as well.  Possibility that this will not correct all symptoms was explained.  Post-operative pain, bleeding, constipation, and other problems after surgery were discussed.  We will work to minimize complications.   Educational handouts further explaining the pathology, treatment options, and bowel regimen were given as well.  Questions were answered.  The patient expresses understanding & wishes to proceed with surgery.  OR FINDINGS: Firm 4 x 4 cm sessile but fungating anal canal mass continuing to the anal verge.  Primarily right lateral and posterior.  Involving 30% of the circumference.  Fixed to and probably going through the posterior anal sphincter complex.  Grossly suspicious for cancer.  Frozen biopsy done.  Discussed with Dr. Jaquita Folds with pathology.  Squamous cell carcinoma noted by him on frozen evaluation.Marland Kitchen  Possible focus of high-grade differentiated versus neuroendocrine changes.  No evidence of adenocarcinoma.  Small posterior midline sentinel tag.  Excised.  7 cm pedunculated verrucous skin tag on left lateral external labia most likely consistent with condyloma versus atypical acrochordon.  Excised.  No other suspicious lesions.  DESCRIPTION:  Informed consent was confirmed. Patient underwent general anesthesia without difficulty. Patient was placed into prone  positioning.  The perianal region was prepped and draped in sterile fashion. Surgical time-out confirmed our plan.  I did digital rectal examination and then transitioned over to anoscopy to get a sense of the anatomy.  Findings noted above.  Patient clearly had a firm ulcerative fungating mostly sessile mass involving the anal canal.  Fullness going through the sphincters.  Highly suspicious for anal cancer.  I do scissors to sharply debulk the specimen without involving the sphincters.  Sent this for frozen specimen.  I assured hemostasis with focused cautery and interrupted Vicryl suture.  Patient had persistent posterior midline anal tag.  It seemed smooth like a sentinel tag from a prior chronic fissure.  I excised it.  I reinspected and hemostasis was good after suturing and pressure held for 15 minutes while I waited for frozen specimen evaluation.  And noted a skin mass on the left lateral labia that I excised vertically and closed with interrupted Monocryl suture and Dermabond.  Frozen specimen confirmed squamous cell cancer, unfortunately.  No evidence of adenocarcinoma.  Since this was not resectable and potentially better treated with chemoradiation therapy, I completed the case.  I had discussed postop care in detail with the patient in the preop holding area.  Instructions for post-operative recovery and prescriptions are written. I discussed operative findings, updated the patient's status, discussed probable steps to recovery, and gave postoperative recommendations to the patient's spouse, Jerricka Carvey, [name].  Recommendations were made.  Questions were answered.  He expressed understanding & appreciation.  [name], M.D., F.A.C.S. Gastrointestinal and Minimally Invasive Surgery [facility] [address] [place], [place] [address] [phone] Main / Paging  Allergies Janeann Forehand, CNA; 04/25/2021 8:58 AM) Sulfa Antibiotics   Allergies  Reconciled    Medication History Janeann Forehand, CNA; 04/25/2021 8:59 AM) Triamcinolone Acetonide  (0.1% Cream, External) Active. Nystatin  (100000 UNIT/ML Suspension, Mouth/Throat) Active. DILTIAZEM Gel  (2% Gel, 1 (one) application External four times daily, Taken starting 04/23/2017) Active. (Apply on anus for 3-6 weeks to allow fissure to heal) oxyCODONE HCl  (5MG Tablet, 1-2 Oral every six hours, as needed for moderate/severe pain, Taken starting 03/17/2021) Active. MiraLax  (Oral) Active. ProAir HFA  (108 (90 Base)MCG/ACT Aerosol Soln, Inhalation) Active. ValACYclovir HCl  (500MG Tablet, Oral as needed) Active. Multivitamin Adult  (Oral) Active. Medications Reconciled   Physical Exam [name] MD; 04/25/2021 9:54 AM) General Mental Status - Alert. General Appearance - Not in acute distress. Voice - Normal.  Integumentary Global Assessment Upon inspection and palpation of skin surfaces of the - Distribution of scalp and body hair is normal. General Characteristics Overall examination of the patient's skin reveals - no rashes and no suspicious lesions.  Head and Neck Head - normocephalic, atraumatic with no lesions or palpable masses. Face Global Assessment - atraumatic, no absence of expression. Neck Global Assessment - no abnormal movements, no decreased range of motion. Trachea - midline. Thyroid Gland Characteristics - non-tender.  Eye Eyeball - Left - Extraocular movements intact, No Nystagmus - Left. Eyeball - Right - Extraocular movements intact, No Nystagmus - Right. Upper Eyelid - Left - No Cyanotic - Left. Upper Eyelid - Right - No Cyanotic - Right.  Chest and Lung Exam Inspection Accessory muscles - No use of accessory muscles in breathing.  Rectal Note:  Patient against any examination.  Given her complaints, recommended at least external examination which was what I would only do at this point. She allowed that. 4  cm radius of pruritus and skin  breakdown consider with radiation. Some yellow-white discharge consistent with round wound. Increased sphincter tone. I held off on any internal or other exam. Examination done with female CMA in room  Peripheral Vascular Upper Extremity Inspection - Left - Not Gangrenous, No Petechiae. Inspection - Right - Not Gangrenous, No Petechiae.  Neurologic Neurologic evaluation reveals  - normal attention span and ability to concentrate, able to name objects and repeat phrases. Appropriate fund of knowledge and normal coordination.  Neuropsychiatric Mental status exam performed with findings of - able to articulate well with normal speech/language, rate, volume and coherence and no evidence of hallucinations, delusions, obsessions or homicidal/suicidal ideation. Orientation - oriented X3.  Musculoskeletal Global Assessment Gait and Station - normal gait and station.  Lymphatic General Lymphatics Description - No Generalized lymphadenopathy.  Assessment & Plan [name] MD; 04/25/2021 9:56 AM) SQUAMOUS CELL CARCINOMA OF ANUS (C21.0) Impression: Anal cancer arising in the setting of AIN from prior chronic fissure.  Follow-up was confused in 2018. I recommend 6 month follow-up given her history of AIN but she only saw the hemorrhoid follow-up that was when necessary. I apologized for the confusion that stressed that she should call if there is any doubt her concern to clarify things.  She is nearly done with her chemoradiation therapy. Last radiation this week.  Start surveillance in the survivable pathway in 3 months.  Nonetheless, I strongly recommend she do our survivable pathway for anal cancer. Usually start doing digital anoscopic exam 3 months after completion of chemoradiation therapy. 3 months the first year, then every 6 months until year 5.  I again gave copies of the pathology reports. Current Plans Follow up with [place] in the office in 3 MONTHS.   Call us sooner as  needed.  Pt Education - [facility] Anal Cancer ([place]) Surgical (Rectal & Anoscopic exam) follow-up after Treatement for Anal Cancer:               -every 3 months until negative exam x2, then -every 6 months until 5 years, then         -as needed thereafter  PRURITUS ANI (L29.0) Impression: Significant pruritus most likely due to radiation and chemotherapy fact.  Recommend she do warm soaks. Consider powder or cotton balls to help dry the area. Sometimes zinc barrier creams are helpful. She recovered recall being told by the cancer center to avoid any of this as it might keep the radiation from resolving. I will reach up to radiation oncology to make sure that that is not truly an issue or not.  I also recommend she get on a fiber supplement to thicken up her stools as she notes they're kind of loose and watery. Then add-on Imodium as needed since she does not have diarrhea. Hopefully she gets further out her stools will normalize and scarring will calm down. Current Plans Pt Education - [facility] Pruritus Ani ([place]) Pt Education - [facility] Good Bowel Health (Shaquera Ansley)  Signed electronically by [name], MD (04/25/2021 9:56 AM)  SURGERY NOTES:  02/25/2021   11:59 AM   PATIENT: Beth Juniper Sherod 50 y.o. female   Patient Care Team: Caryl Bis, MD as PCP - General (Unknown Physician Specialty) Michael Boston, MD as Consulting Physician (General Surgery) Wonda Horner, MD as Consulting Physician (Gastroenterology) [name], MD as Referring Physician (Obstetrics and Gynecology) Virgel Manifold, MD as Consulting Physician (Gastroenterology)   PRE-OPERATIVE DIAGNOSIS: ANAL CANAL MASS WITH AIN  III   POST-OPERATIVE DIAGNOSIS:  Squamous Cell Cancer of the Anal Canal Labial skin mass, probable Condyloma   PROCEDURE:  ANORECTAL EXAM UNDER ANESTHESIA TRANSANAL EXCISIONAL BIOPSY OF ANAL CANAL MASS EXCISION of LABIAL LESION   SURGEON: [name], MD   ANESTHESIA:    General Anorectal & Local field block (0.25% bupivacaine with epinephrine mixed with Liposomal bupivacaine (Experel)   EBL: Total I/O In: 900 [I.V.:900] Out: 10 [Blood:10]. See operative record   Delay start of Pharmacological VTE agent (>24hrs) due to surgical blood loss or risk of bleeding: NO   DRAINS: NONE   SPECIMEN:  -Posterior anal canal ulcerated mass with partial excision for biopsy -Posterior midline anal tag. Most likely sentinel tag -Left external lateral labial skin tag. Acrochordon versus condyloma   DISPOSITION OF SPECIMEN: PATHOLOGY   COUNTS: YES   PLAN OF CARE: Discharge home after PACU   PATIENT DISPOSITION: PACU - hemodynamically stable.   INDICATION: Pleasant patient with history of anal intraepithelial neoplasia status post excision and treatment in 2017. Lost to follow-up. And recurrent swelling and concern. Had colonoscopy which raised concern of recurrent anal canal mass. Biopsy showing at least anal intraepithelial neoplasia. Surgical consultation requested. Patient had discomfort so examination limited in the office. I recommended anorectal examination under anesthesia with removal and/or biopsy.    The anatomy & physiology of the anorectal region was discussed. The pathophysiology of anorectal warts and differential diagnosis was discussed. Natural history risks without surgery was discussed such as further growth and cancer. I stressed the importance of office follow-up to catch early recurrence & minimize/halt progression of disease. Interventions such as cauterization by topical agents were discussed.   The patient's symptoms are not adequately controlled by non-operative treatments. I feel the risks & problems of no surgery outweigh the operative risks; therefore, I recommended surgery to treat the anal warts by removal, ablation and/or cauterization.   Risks such as bleeding, infection, need for further treatment, Risks of bleeding, infection, injury to  other organs, need for repair of tissues / organs, reoperation, heart attack, death, and other risks were discussed. I noted a good likelihood this will help address the problem. Goals of post-operative recovery were discussed as well. Possibility that this will not correct all symptoms was explained. Post-operative pain, bleeding, constipation, and other problems after surgery were discussed. We will work to minimize complications. Educational handouts further explaining the pathology, treatment options, and bowel regimen were given as well. Questions were answered. The patient expresses understanding & wishes to proceed with surgery.     OR FINDINGS: Firm 4 x 4 cm sessile but fungating anal canal mass continuing to the anal verge. Primarily right lateral and posterior. Involving 30% of the circumference. Fixed to and probably going through the posterior anal sphincter complex. Grossly suspicious for cancer. Frozen biopsy done. Discussed with Dr. Jaquita Folds with pathology. Squamous cell carcinoma noted by him on frozen evaluation.Marland Kitchen Possible focus of high-grade differentiated versus neuroendocrine changes. No evidence of adenocarcinoma.   Small posterior midline sentinel tag. Excised.   7 cm pedunculated verrucous skin tag on left lateral external labia most likely consistent with condyloma versus atypical acrochordon. Excised. No other suspicious lesions.   DESCRIPTION:    Informed consent was confirmed. Patient underwent general anesthesia without difficulty. Patient was placed into prone positioning. The perianal region was prepped and draped in sterile fashion. Surgical time-out confirmed our plan.   I did digital rectal examination and then transitioned over to anoscopy to get a  sense of the anatomy. Findings noted above.   Patient clearly had a firm ulcerative fungating mostly sessile mass involving the anal canal. Fullness going through the sphincters. Highly suspicious for anal cancer. I  do scissors to sharply debulk the specimen without involving the sphincters. Sent this for frozen specimen. I assured hemostasis with focused cautery and interrupted Vicryl suture. Patient had persistent posterior midline anal tag. It seemed smooth like a sentinel tag from a prior chronic fissure. I excised it.   I reinspected and hemostasis was good after suturing and pressure held for 15 minutes while I waited for frozen specimen evaluation. And noted a skin mass on the left lateral labia that I excised vertically and closed with interrupted Monocryl suture and Dermabond.   Frozen specimen confirmed squamous cell cancer, unfortunately. No evidence of adenocarcinoma. Since this was not resectable and potentially better treated with chemoradiation therapy, I completed the case.   I had discussed postop care in detail with the patient in the preop holding area. Instructions for post-operative recovery and prescriptions are written. I discussed operative findings, updated the patient's status, discussed probable steps to recovery, and gave postoperative recommendations to the patient's spouse, Mairely Foxworth, [name]. Recommendations were made. Questions were answered. He expressed understanding & appreciation.   [name], M.D., F.A.C.S. Gastrointestinal and Minimally Invasive Surgery [facility] [address] [place], [place] [address] [phone] Main / Paging    PATHOLOGY:  SURGICAL PATHOLOGY  CASE: [id number]  PATIENT: [facility]  Surgical Pathology Report   Clinical History: Anal canal mass with AIN III (crm)   FINAL MICROSCOPIC DIAGNOSIS:   A. 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 ANAL TAG, POSTERIOR, EXCISION:  -  Squamous cell carcinoma  -  See comment   C. LABIA, LEFT, LESION, EXCISION:  -  Condyloma acuminata   COMMENT:   A.  The excision specimen  consists of two distinct histologic  components, "squamoid and neuroendocrine/basaloid".  By  immunohistochemistry, the squamous component is positive for p40 and p16  but negative for CD56, synaptophysin and chromogranin.  The  neuroendocrine component is positive for CD56 and p16 but negative for  p40, synaptophysin and chromogranin.  The neuroendocrine component has  an increased proliferative rate by Ki-67, supporting a high-grade  neuroendocrine carcinoma (small cell carcinoma).  Overall, this is  consistent with a mixed high-grade neuroendocrine and squamous cell  carcinoma.  Dr. Vic Ripper reviewed the case and agrees with the above  diagnosis.   B.  The biopsy consists of a polypoid fragment of squamous mucosa with  dysplasia and carcinoma present submucosally and in empty spaces.  It is  not clear whether these spaces represent retraction artifact or f  spaces.  The neoplastic cells are positive for p16 but negative for  CD56, supporting the diagnosis of squamous cell carcinoma.   C.  p16 immunohistochemistry supports the absence of high-grade  dysplasia.   INTRAOPERATIVE DIAGNOSIS:   A.  Anal rectal mass: "Squamous cell carcinoma.  Separate possible  component with a different morphology and myxoid stroma-defer to  permanent sections"  Intraoperative diagnosis rendered by Dr. Vic Ripper at 11:31 AM on  02/25/2021.   Aryahna Spagna DESCRIPTION:   A: The specimen is received fresh for rapid intraoperative consultation  and consists of a 3.5 x 2.0 x 1.5 cm aggregate of tan-red soft tissue.  The 2 larger portions of tissue  are bisected, the specimen is entirely  submitted for frozen section analysis in 2 cassettes.  Remnants are  resubmitted for permanent.   B: The specimen is received in formalin and consists of a 0.8 x 0.6 x  0.3 cm piece of tan-pink soft tissue.  The specimen is entirely  submitted in 1 cassette.   C: The specimen is received in formalin and consists of a 1.2 x  0.8 x  0.5 cm tan-pink, verrucoid piece of soft tissue.  Resection margin is  inked black, the specimen is bisected and entirely submitted in 1  cassette.  Craig Staggers 02/25/2021)   Final Diagnosis performed by Thressa Sheller, MD.   Electronically signed  03/02/2021  Technical component performed at [facility]  [address], [place], [place] [address].   Professional component performed at [employer]. [facility],  [place] [address] [place], [place] [address].   Immunohistochemistry Technical component (if applicable) was performed  at [facility]. [address] [place],  [place], [place] [address].   IMMUNOHISTOCHEMISTRY DISCLAIMER (if applicable):  Some of these immunohistochemical stains may have been developed and the  performance characteristics determine by [facility]. Some  may not have been cleared or approved by the U.S. Food and Drug  Administration. The FDA has determined that such clearance or approval  is not necessary. This test is used for clinical purposes. It should not  be regarded as investigational or for research. This laboratory is  certified under the [place]  (CLIA-88) as qualified to perform high complexity clinical laboratory  testing.  The controls stained appropriately.   Physical Examination:   Constitutional: Not cachectic. Hygeine adequate. Vitals signs as above.  Eyes: Normal extraocular movements. Sclera nonicteric Neuro: No major focal sensory defects. No major motor deficits. Psych: No severe agitation. No severe anxiety. Judgment & insight Adequate, Oriented x4, HENT: Normocephalic, Mucus membranes moist. No thrush.  Neck: Supple, No tracheal deviation. No obvious thyromegaly Chest: No pain to chest wall compression. Good respiratory excursion. No audible wheezing CV: Regular rhythm. No major extremity edema  Abdomen: Flat Hernia: Not present. Diastasis  recti: Not present. Soft. Nondistended. Nontender.   Gen: Inguinal hernia: Not present. Inguinal lymph nodes: without lymphadenopathy.   Rectal: ##################################  Patient examined in decubitus position.  Perianal skin Perianal skin thickening consistent with prior radiation treatment. No severe pruritus.  Pruritis ani: Not present Anal fissure: Not present Perirectal abscess/fistula Not present External hemorrhoids Not present Pilonidal disease: Not present Condyloma / warts: Not present  Digital rectal exam tolerated. Anoscopy not tolerated  Sphincter tone Normal  Hemorrhoidal piles Grade 1 normal   Rectal masses: Right posterior distal rectal/proximal anal canal mass. At least 2 cm. Elevated. Uncomfortable. Some [employer]. Highly suspicious for recurrent anal cancer fixed to posterior rectal wall. Too sensitive to tolerate anoscopy   ###################################  Ext: No obvious deformity or contracture. Edema: Not present. No cyanosis Skin: Warm and dry Musculoskeletal: Severe joint rigidity not present.   Assessment and Plan:   Khira Cudmore is a 50 y.o. female recovering s/p chemoradiation therapy completed Apr 27, 2021. Examination under anesthesia for anal cancer.  Diagnoses and all orders for this visit:  Anal squamous cell carcinoma (CMS-HCC)    Recurrent rectal bleeding and rectal mass suspicious for recurrent anal cancer 6 months after completing chemoradiation therapy.  I think this requires biopsy. Too sensitive and proximal to do in the office. Urgent anorectal  examination under esthesia with biopsy. If positive as I suspect, she will require abdominoperineal resection. Most likely would benefit from VRAM flap. See if Dr. Iran Planas is available to help do that. Patient no longer smokes. She was understandably tearful and worried. I did not want to get into much detail right now, but I did caution that I was very concerned. We  will get surgery done this week as soon as possible to get biopsy and get the process going. Try and send a frozen specimen intraoperatively.  The anatomy & physiology of the anorectal region was discussed. The pathophysiology of anal cancer and differential diagnosis was discussed. Natural history risks without surgery was discussed such as further growth and cancer. I stressed the importance of office follow-up to catch early recurrence & minimize/halt progression of disease. Interventions such as cauterization by topical agents were discussed.  The patient's symptoms are not adequately controlled by non-operative treatments. I feel the risks & problems of no surgery outweigh the operative risks; therefore, I recommended surgery to examine and biopsy the recurrent anal canal mass.  Risks such as bleeding, infection, need for further treatment, Risks of bleeding, infection, injury to other organs, need for repair of tissues / organs, reoperation, heart attack, death, and other risks were discussed. I noted a good likelihood this will help address the problem. Goals of post-operative recovery were discussed as well. Possibility that this will not correct all symptoms was explained. Post-operative pain, bleeding, constipation, and other problems after surgery were discussed. We will work to minimize complications. Educational handouts further explaining the pathology, treatment options, and bowel regimen were given as well. Questions were answered. The patient expresses understanding & wishes to proceed with surgery.      [name], MD, FACS, MASCRS Esophageal, Gastrointestinal & Colorectal Surgery Robotic and Minimally Invasive Surgery  [facility], [place]  [place]. [address], [place], [place] [address] [phone] Fax [phone] Main  CONTACT INFORMATION:  Weekday (9AM-5PM): Call [facility] main office at  [phone]  Weeknight (5PM-9AM) or Weekend/Holiday: Check [email or web address] (password " [email or web address]") for General Surgery [facility] coverage  (Please, do not use SecureChat as it is not reliable communication to operating surgeons for immediate patient care)

## 2021-09-30 NOTE — Anesthesia Postprocedure Evaluation (Signed)
Anesthesia Post Note  Patient: Beth [name]  Procedure(s) Performed: ANORECTAL MASS BIOPSY (Rectum) ANORECTAL EXAMINATION UNDER ANESTHESIA (Rectum)     Patient location during evaluation: PACU Anesthesia Type: General Level of consciousness: awake and alert, patient cooperative and oriented Pain management: pain level controlled (pain improving) Vital Signs Assessment: post-procedure vital signs reviewed and stable Respiratory status: spontaneous breathing, nonlabored ventilation and respiratory function stable Cardiovascular status: blood pressure returned to baseline and stable Postop Assessment: no apparent nausea or vomiting Anesthetic complications: no   No notable events documented.  Last Vitals:  Vitals:   09/30/21 1715 09/30/21 1730  BP: (!) 150/96 140/90  Pulse: 74 85  Resp: 10 14  Temp:    SpO2: 91% 95%    Last Pain:  Vitals:   09/30/21 1645  TempSrc:   PainSc: 8                  Shelbia Scinto,E. Ace Bergfeld

## 2021-09-30 NOTE — Transfer of Care (Signed)
Immediate Anesthesia Transfer of Care Note  Patient: Beth [name]  Procedure(s) Performed: ANORECTAL MASS BIOPSY (Rectum) ANORECTAL EXAMINATION UNDER ANESTHESIA (Rectum)  Patient Location: PACU  Anesthesia Type:General  Level of Consciousness: awake, alert , oriented and patient cooperative  Airway & Oxygen Therapy: Patient Spontanous Breathing and Patient connected to face mask oxygen  Post-op Assessment: Report given to RN and Post -op Vital signs reviewed and stable  Post vital signs: Reviewed and stable  Last Vitals:  Vitals Value Taken Time  BP 172/101 09/30/21 1634  Temp    Pulse 81 09/30/21 1637  Resp 11 09/30/21 1637  SpO2 96 % 09/30/21 1637  Vitals shown include unvalidated device data.  Last Pain:  Vitals:   09/30/21 1305  TempSrc:   PainSc: 0-No pain         Complications: No notable events documented.

## 2021-09-30 NOTE — Discharge Instructions (Addendum)
##############################################  ANORECTAL SURGERY:  POST OPERATIVE INSTRUCTIONS  ######################################################################  EAT Start with a pureed / full liquid diet After 24 hours, gradually transition to a high fiber diet.    CONTROL PAIN Control pain so you can tolerate bowel movements,  walk, sleep, tolerate sneezing/coughing, and go up/down stairs.   HAVE A BOWEL MOVEMENT DAILY Keep your bowels regular to avoid problems.   Taking a fiber supplement every day to keep bowels soft.   Try a laxative to override constipation. Use an antidairrheal to slow down diarrhea.   Call if not better after 2 tries  WALK Walk an hour a day.  Control your pain to do that.   CALL IF YOU HAVE PROBLEMS/CONCERNS Call if you are still struggling despite following these instructions. Call if you have concerns not answered by these instructions  ######################################################################    Take your usually prescribed home medications unless otherwise directed.  DIET: Follow a light bland diet & liquids the first 24 hours after arrival home, such as soup, liquids, starches, etc.  Be sure to drink plenty of fluids.  Quickly advance to a usual solid diet within a few days.  Avoid fast food or heavy meals as your are more likely to get nauseated or have irregular bowels.  A low-fat, high-fiber diet for the rest of your life is ideal.  PAIN CONTROL: Pain is best controlled by a usual combination of many methods TOGETHER: Warm baths/soaks or Ice packs Over the counter pain medication Prescription pain medications Topical creams  Expect swelling and discomfort in the anus/rectal area.  Warm water baths (30-60 minutes up to 6 times a day, especially after bowel meovements) will help. Use ice for the first few days to help decrease swelling and bruising, then switch to heat such as warm towels, sitz baths, warm baths, etc to  help relax tight/sore spots and speed recovery.  Some people prefer to use ice alone, heat alone, alternating between ice & heat.  Experiment to what works for you.   It is helpful to take an over-the-counter pain medication continuously for the first few weeks.  Choose one of the following that works best for you: Naproxen (Aleve, etc)  Two 220mg tabs twice a day Ibuprofen (Advil, etc) Three 200mg tabs four times a day (every meal & bedtime) Acetaminophen (Tylenol, etc) 500-650mg four times a day (every meal & bedtime) A  prescription for pain medication (such as oxycodone, hydrocodone, etc) should be given to you upon discharge.  Take your pain medication as prescribed.  If you are having problems/concerns with the prescription medicine (does not control pain, nausea, vomiting, rash, itching, etc), please call us [phone] to see if we need to switch you to a different pain medicine that will work better for you and/or control your side effect better. If you need a refill on your pain medication, please contact your pharmacy.  They will contact our office to request authorization. Prescriptions will not be filled after 5 pm or on week-ends.  If can take up to 48 hours for it to be filled & ready so avoid waiting until you are down to thel ast pill. A topical cream (Dibucaine) or a prescription for a cream (such as diltiazem 2% gel) may be given to you.  Many people find relief with topical creams.  Some people find it burns too much.  Experiment.  If it helps, use it.  If it burns, don't using it. You also may receive a prescription for   diazepam, and a muscle relaxant to help you to be able to urinate and defecate more easily.  It is safe to take a few doses with the other medications as long as you are not planning to drive or do anything intense.  Hopefully this can minimize the chance of needing a Foley catheter into your bladder   Use a Sitz Bath 4-8 times a day for relief   Sitz Bath A  sitz bath is a warm water bath taken in the sitting position that covers only the hips and buttocks. It may be used for either healing or hygiene purposes. Sitz baths are also used to relieve pain, itching, or muscle spasms. The water may contain medicine. Moist heat will help you heal and relax.  HOME CARE INSTRUCTIONS  Take 3 to 4 sitz baths a day. Fill the bathtub half full with warm water. Sit in the water and open the drain a little. Turn on the warm water to keep the tub half full. Keep the water running constantly. Soak in the water for 15 to 20 minutes. After the sitz bath, pat the affected area dry first.   KEEP YOUR BOWELS REGULAR The goal is one soft bowel movement a day Avoid getting constipated.  Between the surgery and the pain medications, it is common to experience some constipation.  Increasing fluid intake and taking a fiber supplement (such as Metamucil, Citrucel, FiberCon, MiraLax, etc) 2-3 times a day regularly will usually help prevent this problem from occurring.  A mild laxative (prune juice, Milk of Magnesia, MiraLax, etc) should be taken according to package directions if there are no bowel movements after 48 hours. Watch out for diarrhea.  If you have many loose bowel movements, simplify your diet to bland foods & liquids for a few days.  Stop any stool softeners and decrease your fiber supplement.  Switching to mild anti-diarrheal medications (Kayopectate, Pepto Bismol) can help.  Can try an imodium/loperamide dose.  If this worsens or does not improve, please call us.  Wound Care  Remove your bandages with your first bowel movement, usually the day after surgery.  Let the gauze fall off with the first bowel movement or shower.   Wear an absorbent pad or soft cotton balls in your underwear as needed to catch any drainage and help keep the area  Keep the area clean and dry.  Bathe / shower every day.  Keep the area clean by showering / bathing over the incision / wound.    It is okay to soak an open wound to help wash it.  Consider using a squeeze bottle filled with warm water to gently wash the anal area.  Wet wipes or showers / gentle washing after bowel movements is often less traumatic than regular toilet paper. You will often notice bleeding with bowel movements.  This should slow down by the end of the first week of surgery.  Sitting on an ice pack can help. Expect some drainage.  This should slow down by the end of the first week of surgery, but you will have occasional bleeding or drainage up to a few months after surgery.  Wear an absorbent pad or soft cotton gauze in your underwear until the drainage stops.  ACTIVITIES as tolerated:   You may resume regular (light) daily activities beginning the next day--such as daily self-care, walking, climbing stairs--gradually increasing activities as tolerated.  If you can walk 30 minutes without difficulty, it is safe to try more intense activity   such as jogging, treadmill, bicycling, low-impact aerobics, swimming, etc. Save the most intensive and strenuous activity for last such as sit-ups, heavy lifting, contact sports, etc  Refrain from any heavy lifting or straining until you are off narcotics for pain control.   DO NOT PUSH THROUGH PAIN.  Let pain be your guide: If it hurts to do something, don't do it.  Pain is your body warning you to avoid that activity for another week until the pain goes down. You may drive when you are no longer taking prescription pain medication, you can comfortably sit for long periods of time, and you can safely maneuver your car and apply brakes. You may have sexual intercourse when it is comfortable.  FOLLOW UP in our office Please call [facility] at [phone] to set up an appointment to see your surgeon in the office for a follow-up appointment approximately 2-3 weeks after your surgery. Make sure that you call for this appointment the day you arrive home to ensure a convenient appointment  time.  8. IF YOU HAVE DISABILITY OR FAMILY LEAVE FORMS, BRING THEM TO THE OFFICE FOR PROCESSING.  DO NOT GIVE THEM TO YOUR DOCTOR.        WHEN TO CALL US [phone]: Poor pain control Reactions / problems with new medications (rash/itching, nausea, etc)  Fever over 101.5 F (38.5 C) Inability to urinate Nausea and/or vomiting Worsening swelling or bruising Continued bleeding from incision. Increased pain, redness, or drainage from the incision  The clinic staff is available to answer your questions during regular business hours (8:30am-5pm).  Please don't hesitate to call and ask to speak to one of our nurses for clinical concerns.   A surgeon from [facility] is always on call at the hospitals   If you have a medical emergency, go to the nearest emergency room or call 911.    [facility] [address], [address], [place], [place]  [address] ? MAIN: [phone] ? TOLL FREE: [phone] ? FAX [phone] [email or web address]  #####################################################     

## 2021-09-30 NOTE — Op Note (Signed)
09/30/2021  4:27 PM  PATIENT:  Beth [name]  50 y.o. female  Patient Care Team: Caryl Bis, MD as PCP - General (Unknown Physician Specialty) Michael Boston, MD as Consulting Physician (General Surgery) Wonda Horner, MD as Consulting Physician (Gastroenterology) [name], MD as Referring Physician (Obstetrics and Gynecology) Virgel Manifold, MD as Consulting Physician (Gastroenterology) Ladell Pier, MD as Consulting Physician (Oncology)  PRE-OPERATIVE DIAGNOSIS:  RECURRENT ANAL RECTAL MASS. HISTORY OF ANAL CANCER  POST-OPERATIVE DIAGNOSIS:  RECURRENT ANAL RECTAL MASS. HISTORY OF ANAL CANCER  PROCEDURE:    ANORECTAL MASS BIOPSY ANORECTAL EXAMINATION UNDER ANESTHESIA  SURGEON:  [name], MD  ANESTHESIA:   General Anorectal & Local field block (0.25% bupivacaine with epinephrine mixed with Liposomal bupivacaine (Experel)   EBL:  Total I/O In: -  Out: 40 [Blood:40].  See operative record  Delay start of Pharmacological VTE agent (>24hrs) due to surgical blood loss or risk of bleeding:  NO  DRAINS: NONE  SPECIMEN:     Proximal anal canal polypoid mass excisional biopsy done.  Frozen consistent with cancer.  Request made for more tissue for more definitive staining and work-up, therefore more aggressive full-thickness debulking full thickness biopsy done.  DISPOSITION OF SPECIMEN:  PATHOLOGY  COUNTS:  YES  PLAN OF CARE: Discharge home after PACU  PATIENT DISPOSITION:  PACU - hemodynamically stable.  INDICATION: Patient with distant history of AIN who developed recurrent mass.  Biopsy consistent with anal carcinoma with neuroendocrine features.  Discussed in GI multidisciplinary tumor board.  Underwent standard chemoradiation therapy for modified Nigro protocol which was standard.  Good response at 72-month follow-up.  34-month follow-up showing c/o bleeding - DRE with larger anal mass.  I recommended examination under anesthesia to determine  if there is recurrence.  Too sensitive to biopsy in the office.  With physiology of anal rectal mass discussed.  Differential diagnosis discussed.  Risk benefits alternatives discussed.  Recommendation made for anorectal examination under anesthesia with biopsy and frozen specimen to determine pathology.  Risk of bleeding, pain incontinence, and other issues discussed.  Questions answered.  She agrees to proceed  OR FINDINGS: Ulcerative fibrotic mass fixed to the posterior rectal wall involving a third of the circumference from right lateral to left posterolateral.  Proximal anal canal into distal rectum.  Initial biopsy consistent with cancer.  Request made for more tissue biopsies.  DESCRIPTION:   Informed consent was confirmed. Patient underwent general anesthesia without difficulty. Patient was placed into prone positioning.  The perianal region was prepped and draped in sterile fashion. Surgical time-out confirmed our plan.  I did digital rectal examination and then transitioned over to anoscopy to get a sense of the anatomy.  Findings noted above.   I could clearly see a fixed fibrotic ulcerative bleeding mass involving a third of the distal rectum and anal canal strongly suspicious for recurrent anal cancer.  I was able to excise a frond of the ulcerative mass.  10 x 7 x 7 cm.  Sent for frozen specimen.  I assured hemostasis with cautery and pressure.  Dr. Vic Ripper with pathology noted specimen shows cancer.  However cannot determine if it is squamous cell cancer for certain.  He requested more tissue for definitive final pathology work-up.  I redid anoscopy & examination.  I used that scissors to debulk the mass more aggressively including full-thickness excision at the anorectal junction.  I excised the center of the mass that seem most suspicious.  Encountered some bleeding  in the distal rectum.  I controlled this with 2-0 Vicryl interrupted suture at the apex.  I then ran sutures on the  edges of the excisional biopsy to ensure hemostasis.  Sphincters not injured. Hemostasis was good.  Fluffed gauze was on-laid over the perianal region.  No packing done.  Patient is being extubated go to go to the recovery room.  I had discussed postop care in detail with the patient in the preop holding area.  Instructions for post-operative recovery and prescriptions are written. I discussed operative findings, updated the patient's status, discussed probable steps to recovery, and gave postoperative recommendations to the patient's spouse, [name].  Recommendations were made.  We will await final pathology to determine final recommendations the most likely patient will require an abdominal perineal resection with permanent colostomy.  See if by plastic reconstructive surgery colleagues can help do a VRAM flap to allow better healing.  Questions were answered.  He expressed understanding & appreciation.  [name], M.D., F.A.C.S. Gastrointestinal and Minimally Invasive Surgery [facility] [address] [place], [place] [address] [phone] Main / Paging

## 2021-09-30 NOTE — Anesthesia Procedure Notes (Signed)
Procedure Name: Intubation Date/Time: 09/30/2021 3:31 PM Performed by: Lavina Hamman, CRNA Pre-anesthesia Checklist: Patient identified, Emergency Drugs available, Suction available, Patient being monitored and Timeout performed Patient Re-evaluated:Patient Re-evaluated prior to induction Oxygen Delivery Method: Circle system utilized Preoxygenation: Pre-oxygenation with 100% oxygen Induction Type: IV induction Ventilation: Mask ventilation without difficulty Laryngoscope Size: Mac and 4 Grade View: Grade II Tube type: Oral Tube size: 7.0 mm Number of attempts: 1 Airway Equipment and Method: Stylet Placement Confirmation: ETT inserted through vocal cords under direct vision, positive ETCO2, CO2 detector and breath sounds checked- equal and bilateral Secured at: 22 cm Tube secured with: Tape Dental Injury: Teeth and Oropharynx as per pre-operative assessment  Comments: ATOI.  LMA 4 and 3 would not pass.

## 2021-09-30 NOTE — Interval H&P Note (Signed)
History and Physical Interval Note:  09/30/2021 1:41 PM  Beth [name]  has presented today for surgery, with the diagnosis of RECURRENT ANAL RECTAL MASS. HISTORY OF ANAL CANCER.  The various methods of treatment have been discussed with the patient and family. After consideration of risks, benefits and other options for treatment, the patient has consented to  Procedure(s) with comments: ANORECTAL MASS BIOPSY (N/A) ANORECTAL EXAMINATION UNDER ANESTHESIA (N/A) - GEN & LOCAL ANESTHESIA as a surgical intervention.  The patient's history has been reviewed, patient examined, no change in status, stable for surgery.  I have reviewed the patient's chart and labs.  Questions were answered to the patient's satisfaction.    I have re-reviewed the the patient's records, history, medications, and allergies.  I have re-examined the patient.  I again discussed intraoperative plans and goals of post-operative recovery.  The patient agrees to proceed.  Beth [name]  1971/09/04 [id number]  Patient Care Team: Caryl Bis, MD as PCP - General (Unknown Physician Specialty) Michael Boston, MD as Consulting Physician (General Surgery) Wonda Horner, MD as Consulting Physician (Gastroenterology) [name], MD as Referring Physician (Obstetrics and Gynecology) Virgel Manifold, MD as Consulting Physician (Gastroenterology) Ladell Pier, MD as Consulting Physician (Oncology)  Patient Active Problem List   Diagnosis Date Noted   Squamous cell carcinoma of anal canal ([facility]) 02/25/2021    Priority: 1.   Anal squamous cell carcinoma ([facility]) 03/01/2021   History of migraine    Encounter for screening colonoscopy    Rectal polyp    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 08/30/2016 10/07/2016   Tobacco abuse 08/08/2016   Chronic anal fissure s/p partial sphincterotomy 08/30/2016 08/08/2016   Constipation, chronic 08/08/2016    Past Medical History:  Diagnosis  Date   Acne    on face spirolactone for   Anal cancer ([facility]) 02/25/2021   Constipation, chronic 08/08/2016   History of migraine    none in years   Wears glasses     Past Surgical History:  Procedure Laterality Date   ABDOMINAL HYSTERECTOMY  2016   partial   COLONOSCOPY     COLONOSCOPY WITH PROPOFOL N/A 01/28/2021   Procedure: COLONOSCOPY WITH PROPOFOL;  Surgeon: Virgel Manifold, MD;  Location: [facility];  Service: Endoscopy;  Laterality: N/A;   EXCISION HYDRADENITIS LABIA Left 02/25/2021   Procedure: EXCISION of  LABIAL LESIOM;  Surgeon: Michael Boston, MD;  Location: [facility];  Service: General;  Laterality: Left;   IR FLUORO GUIDE CV LINE RIGHT  04/18/2021   RECTAL EXAM UNDER ANESTHESIA N/A 02/25/2021   Procedure: ANORECTAL EXAM UNDER ANESTHESIA;  Surgeon: Michael Boston, MD;  Location: [facility];  Service: General;  Laterality: N/A;   SPHINCTEROTOMY N/A 08/30/2016   Procedure: LATERAL INTERNAL AND SPHINCTEROTOMY;  Surgeon: Michael Boston, MD;  Location: [facility];  Service: General;  Laterality: N/A;   TONSILLECTOMY  as child   TRANSANAL EXCISION OF RECTAL MASS N/A 02/25/2021   Procedure: TRANSANAL EXCISIONAL  BIOPSY OF ANAL CANAL MASS;  Surgeon: Michael Boston, MD;  Location: [facility];  Service: General;  Laterality: N/A;    Social History   Socioeconomic History   Marital status: Married    Spouse name: Not on file   Number of children: Not on file   Years of education: Not on file   Highest education level: Not on file  Occupational History   Not on file  Tobacco Use   Smoking status: Former    Packs/day: 1.00    Years: 8.00    Pack years: 8.00    Types: Cigarettes    Quit date: 02/15/2021    Years since quitting: 0.6   Smokeless tobacco: Never  Vaping Use   Vaping Use: Never used  Substance and Sexual Activity   Alcohol use: Yes    Comment: 2 dirnks daily   Drug use: No   Sexual activity: Yes    Birth  control/protection: Surgical    Comment: Hysterectomy  Other Topics Concern   Not on file  Social History Narrative   Not on file   Social Determinants of Health   Financial Resource Strain: Not on file  Food Insecurity: Not on file  Transportation Needs: Not on file  Physical Activity: Not on file  Stress: Not on file  Social Connections: Not on file  Intimate Partner Violence: Not on file    Family History  Problem Relation Age of Onset   Breast cancer Paternal Aunt     Medications Prior to Admission  Medication Sig Dispense Refill Last Dose   Biotin w/ Vitamins C & E (HAIR SKIN & NAILS GUMMIES PO) Take 2 tablets by mouth daily. Gummies   09/29/2021   cetirizine (ZYRTEC) 10 MG tablet Take 10 mg by mouth daily.   09/29/2021   gabapentin (NEURONTIN) 300 MG capsule Take 1 capsule (300 mg total) by mouth 2 (two) times daily for 3 days. 1 tablet po once daily on day 1, 1 tablet po bid day 2, then 1 tablet po tid (Patient taking differently: Take 300 mg by mouth 2 (two) times daily.) 60 capsule 2 09/30/2021 at 0700   spironolactone (ALDACTONE) 100 MG tablet Take 100 mg by mouth daily.   09/29/2021   triamcinolone cream (KENALOG) 0.1 % Apply 1 application topically 4 (four) times daily as needed (eczema).   Past Week   oxyCODONE (OXY IR/ROXICODONE) 5 MG immediate release tablet Take 1-2 tablets (5-10 mg total) by mouth every 6 (six) hours as needed for moderate pain, severe pain or breakthrough pain. (Patient not taking: No sig reported) 60 tablet 0 Not Taking    Current Facility-Administered Medications  Medication Dose Route Frequency Provider Last Rate Last Admin   bupivacaine liposome (EXPAREL) 1.3 % injection 266 mg  20 mL Infiltration Once Michael Boston, MD       cefTRIAXone (ROCEPHIN) 2 g in sodium chloride 0.9 % 100 mL IVPB  2 g Intravenous Q24H Michael Boston, MD       Chlorhexidine Gluconate Cloth 2 % PADS 6 each  6 each Topical Once Michael Boston, MD       gabapentin  (NEURONTIN) capsule 300 mg  300 mg Oral On Call to OR Michael Boston, MD       lactated ringers infusion   Intravenous Continuous Annye Asa, MD 10 mL/hr at 09/30/21 1318 New Bag at 09/30/21 1318   sodium chloride 0.9 % with cefTRIAXone (ROCEPHIN) ADS Med              Allergies  Allergen Reactions   Clindamycin/Lincomycin Rash   Sulfa Drugs Cross Reactors Itching and Rash    BP (!) 130/92   Pulse 90   Temp 97.9 F (36.6 C) (Oral)   Resp 16   Ht 5\' 7"  (1.702 m)   Wt 69.9 kg   LMP 03/13/2012   SpO2 100%   BMI 24.14 kg/m   Labs: Results for orders placed  or performed during the hospital encounter of 09/29/21 (from the past 48 hour(s))  CBC     Status: Abnormal   Collection Time: 09/29/21  8:20 AM  Result Value Ref Range   WBC 5.1 4.0 - 10.5 K/uL   RBC 3.91 3.87 - 5.11 MIL/uL   Hemoglobin 13.4 12.0 - 15.0 g/dL   HCT 40.6 36.0 - 46.0 %   MCV 103.8 (H) 80.0 - 100.0 fL   MCH 34.3 (H) 26.0 - 34.0 pg   MCHC 33.0 30.0 - 36.0 g/dL   RDW 12.3 11.5 - 15.5 %   Platelets 150 150 - 400 K/uL   nRBC 0.0 0.0 - 0.2 %    Comment: Performed at [facility], [place] [address], [place], [place] [address]  Basic metabolic panel     Status: Abnormal   Collection Time: 09/29/21  8:20 AM  Result Value Ref Range   Sodium 140 135 - 145 mmol/L   Potassium 4.2 3.5 - 5.1 mmol/L   Chloride 105 98 - 111 mmol/L   CO2 24 22 - 32 mmol/L   Glucose, Bld 108 (H) 70 - 99 mg/dL    Comment: Glucose reference range applies only to samples taken after fasting for at least 8 hours.   BUN 13 6 - 20 mg/dL   Creatinine, Ser 0.60 0.44 - 1.00 mg/dL   Calcium 9.7 8.9 - 10.3 mg/dL   GFR, Estimated >60 >60 mL/min    Comment: (NOTE) Calculated using the CKD-EPI Creatinine Equation (2021)    Anion gap 11 5 - 15    Comment: Performed at [facility], [place] [address] [place], [place] [address]    Imaging / Studies: No results found.   [name], M.D.,  F.A.C.S. Gastrointestinal and Minimally Invasive Surgery [facility] [address], [place], [place] [address] [phone] Main / Paging  09/30/2021 1:41 PM     [name]

## 2021-10-01 ENCOUNTER — Encounter (HOSPITAL_COMMUNITY): Payer: Self-pay | Admitting: Surgery

## 2021-10-03 LAB — SURGICAL PATHOLOGY

## 2021-10-04 ENCOUNTER — Ambulatory Visit: Payer: Self-pay | Admitting: Surgery

## 2021-10-04 DIAGNOSIS — R739 Hyperglycemia, unspecified: Secondary | ICD-10-CM

## 2021-10-06 ENCOUNTER — Other Ambulatory Visit: Payer: Self-pay | Admitting: Urology

## 2021-10-07 DIAGNOSIS — Z923 Personal history of irradiation: Secondary | ICD-10-CM | POA: Diagnosis not present

## 2021-10-07 DIAGNOSIS — C211 Malignant neoplasm of anal canal: Secondary | ICD-10-CM | POA: Diagnosis not present

## 2021-10-12 NOTE — Patient Instructions (Signed)
DUE TO COVID-19 ONLY ONE VISITOR IS ALLOWED TO COME WITH YOU AND STAY IN THE WAITING ROOM ONLY DURING PRE OP AND PROCEDURE.   **NO VISITORS ARE ALLOWED IN THE SHORT STAY AREA OR RECOVERY ROOM!!**  IF YOU WILL BE ADMITTED INTO THE HOSPITAL YOU ARE ALLOWED ONLY TWO SUPPORT PEOPLE DURING VISITATION HOURS ONLY (7 AM -8PM)   The support person(s) must pass our screening, gel in and out, and wear a mask at all times, including in the patient's room. Patients must also wear a mask when staff or their support person are in the room. Visitors GUEST BADGE MUST BE WORN VISIBLY  One adult visitor may remain with you overnight and MUST be in the room by 8 P.M.  No visitors under the age of 67. Any visitor under the age of 48 must be accompanied by an adult.    COVID SWAB TESTING MUST BE COMPLETED ON:  11/01/21 **MUST PRESENT COMPLETED FORM AT TESTING SITE**    Beth [name] (backside of the building) You are not required to quarantine, however you are required to wear a well-fitted mask when you are out and around people not in your household.  Hand Hygiene often Do NOT share personal items Notify your provider if you are in close contact with someone who has COVID or you develop fever 100.4 or greater, new onset of sneezing, cough, sore throat, shortness of breath or body aches.  Beth [name], [address], must go inside of the hospital, NOT A DRIVE THRU!  (Must self quarantine after testing. Follow instructions on handout.)       Your procedure is scheduled on: 11/03/21   Report to [facility] Main Entrance    Report to short stay at: 5:15 AM   Call this number if you have problems the morning of surgery [phone]   DRINK 2 PRESURGERY ENSURE DRINKS THE NIGHT BEFORE SURGERY AT:  1000 PM AND 1 PRESURGERY DRINK THE DAY OF THE PROCEDURE 3 HOURS PRIOR TO SCHEDULED SURGERY. NO SOLIDS AFTER MIDNIGHT THE DAY  PRIOR TO THE SURGERY. NOTHING BY MOUTH EXCEPT CLEAR LIQUIDS UNTIL THREE HOURS PRIOR TO SCHEDULED SURGERY. PLEASE FINISH PRESURGERY ENSURE DRINK PER SURGEON ORDER 3 HOURS PRIOR TO SCHEDULED SURGERY TIME WHICH NEEDS TO BE COMPLETED AT : 4:30 AM.   CLEAR LIQUID DIET until: 4:30 AM  Foods Allowed                                                                     Foods Excluded  Water, Black Coffee and tea, regular and decaf                             liquids that you cannot  Plain Jell-O in any flavor  (No red)                                           see through such as: Fruit ices (not with fruit pulp)  milk, soups, orange juice              Iced Popsicles (No red)                                    All solid food                                   Apple juices Sports drinks like Gatorade (No red) Lightly seasoned clear broth or consume(fat free) Sugar  Sample Menu Breakfast                                Lunch                                     Supper Cranberry juice                    Beef broth                            Chicken broth Jell-O                                     Grape juice                           Apple juice Coffee or tea                        Jell-O                                      Popsicle                                                Coffee or tea                        Coffee or tea    Drink plenty fluid the day before surgery.  Oral Hygiene is also important to reduce your risk of infection.                                    Remember - BRUSH YOUR TEETH THE MORNING OF SURGERY WITH YOUR REGULAR TOOTHPASTE   Do NOT smoke after Midnight   Take these medicines the morning of surgery with A SIP OF WATER: gabapentin,cetirizine.  DO NOT TAKE ANY ORAL DIABETIC MEDICATIONS DAY OF YOUR SURGERY                              You may not have any metal on your body including hair pins, jewelry, and body piercing  Do  not wear make-up, lotions, powders, perfumes/cologne, or deodorant  Do not wear nail polish including gel and S&S, artificial/acrylic nails, or any other type of covering on natural nails including finger and toenails. If you have artificial nails, gel coating, etc. that needs to be removed by a nail salon please have this removed prior to surgery or surgery may need to be canceled/ delayed if the surgeon/ anesthesia feels like they are unable to be safely monitored.   Do not shave  48 hours prior to surgery.    Do not bring valuables to the hospital. [place].   Contacts, dentures or bridgework may not be worn into surgery.   Bring small overnight bag day of surgery.    Patients discharged on the day of surgery will not be allowed to drive home.   Special Instructions: Bring a copy of your healthcare power of attorney and living will documents         the day of surgery if you haven't scanned them before.              Please read over the following fact sheets you were given: IF YOU HAVE QUESTIONS ABOUT YOUR PRE-OP INSTRUCTIONS PLEASE CALL [phone]     [facility] - Preparing for Surgery Before surgery, you can play an important role.  Because skin is not sterile, your skin needs to be as free of germs as possible.  You can reduce the number of germs on your skin by washing with CHG (chlorahexidine gluconate) soap before surgery.  CHG is an antiseptic cleaner which kills germs and bonds with the skin to continue killing germs even after washing. Please DO NOT use if you have an allergy to CHG or antibacterial soaps.  If your skin becomes reddened/irritated stop using the CHG and inform your nurse when you arrive at Short Stay. Do not shave (including legs and underarms) for at least 48 hours prior to the first CHG shower.  You may shave your face/neck. Please follow these instructions carefully:  1.  Shower with CHG Soap the night before  surgery and the  morning of Surgery.  2.  If you choose to wash your hair, wash your hair first as usual with your  normal  shampoo.  3.  After you shampoo, rinse your hair and body thoroughly to remove the  shampoo.                           4.  Use CHG as you would any other liquid soap.  You can apply chg directly  to the skin and wash                       Gently with a scrungie or clean washcloth.  5.  Apply the CHG Soap to your body ONLY FROM THE NECK DOWN.   Do not use on face/ open                           Wound or open sores. Avoid contact with eyes, ears mouth and genitals (private parts).                       Wash face,  Genitals (private parts) with your normal soap.  6.  Wash thoroughly, paying special attention to the area where your surgery  will be performed.  7.  Thoroughly rinse your body with warm water from the neck down.  8.  DO NOT shower/wash with your normal soap after using and rinsing off  the CHG Soap.                9.  Pat yourself dry with a clean towel.            10.  Wear clean pajamas.            11.  Place clean sheets on your bed the night of your first shower and do not  sleep with pets. Day of Surgery : Do not apply any lotions/deodorants the morning of surgery.  Please wear clean clothes to the hospital/surgery center.  FAILURE TO FOLLOW THESE INSTRUCTIONS MAY RESULT IN THE CANCELLATION OF YOUR SURGERY PATIENT SIGNATURE_________________________________  NURSE SIGNATURE__________________________________  ________________________________________________________________________   Beth [name]  An incentive spirometer is a tool that can help keep your lungs clear and active. This tool measures how well you are filling your lungs with each breath. Taking long deep breaths may help reverse or decrease the chance of developing breathing (pulmonary) problems (especially infection) following: A long period of time when you are unable to  move or be active. BEFORE THE PROCEDURE  If the spirometer includes an indicator to show your best effort, your nurse or respiratory therapist will set it to a desired goal. If possible, sit up straight or lean slightly forward. Try not to slouch. Hold the incentive spirometer in an upright position. INSTRUCTIONS FOR USE  Sit on the edge of your bed if possible, or sit up as far as you can in bed or on a chair. Hold the incentive spirometer in an upright position. Breathe out normally. Place the mouthpiece in your mouth and seal your lips tightly around it. Breathe in slowly and as deeply as possible, raising the piston or the ball toward the top of the column. Hold your breath for 3-5 seconds or for as long as possible. Allow the piston or ball to fall to the bottom of the column. Remove the mouthpiece from your mouth and breathe out normally. Rest for a few seconds and repeat Steps 1 through 7 at least 10 times every 1-2 hours when you are awake. Take your time and take a few normal breaths between deep breaths. The spirometer may include an indicator to show your best effort. Use the indicator as a goal to work toward during each repetition. After each set of 10 deep breaths, practice coughing to be sure your lungs are clear. If you have an incision (the cut made at the time of surgery), support your incision when coughing by placing a pillow or rolled up towels firmly against it. Once you are able to get out of bed, walk around indoors and cough well. You may stop using the incentive spirometer when instructed by your caregiver.  RISKS AND COMPLICATIONS Take your time so you do not get dizzy or light-headed. If you are in pain, you may need to take or ask for pain medication before doing incentive spirometry. It is harder to take a deep breath if you are having pain. AFTER USE Rest and breathe slowly and easily. It can be helpful to keep track of a log of your progress. Your caregiver can  provide you with a simple table to help with this. If you are  using the spirometer at home, follow these instructions: [place] IF:  You are having difficultly using the spirometer. You have trouble using the spirometer as often as instructed. Your pain medication is not giving enough relief while using the spirometer. You develop fever of 100.5 F (38.1 C) or higher. SEEK IMMEDIATE MEDICAL CARE IF:  You cough up bloody sputum that had not been present before. You develop fever of 102 F (38.9 C) or greater. You develop worsening pain at or near the incision site. MAKE SURE YOU:  Understand these instructions. Will watch your condition. Will get help right away if you are not doing well or get worse. Document Released: 04/16/2007 Document Revised: 02/26/2012 Document Reviewed: 06/17/2007 [facility] [place] [facility] Patient Information 2014 [place], [place].   ________________________________________________________________________

## 2021-10-13 ENCOUNTER — Encounter (HOSPITAL_COMMUNITY): Payer: Self-pay

## 2021-10-13 ENCOUNTER — Encounter (HOSPITAL_COMMUNITY)
Admission: RE | Admit: 2021-10-13 | Discharge: 2021-10-13 | Disposition: A | Discharge status: Home / Self Care | Payer: BC Managed Care – PPO | Source: Ambulatory Visit | Attending: Surgery | Admitting: Surgery

## 2021-10-13 ENCOUNTER — Other Ambulatory Visit: Payer: Self-pay

## 2021-10-13 ENCOUNTER — Ambulatory Visit (HOSPITAL_COMMUNITY)
Admission: RE | Admit: 2021-10-13 | Discharge: 2021-10-13 | Disposition: A | Discharge status: Home / Self Care | Payer: BC Managed Care – PPO | Source: Ambulatory Visit | Attending: Nurse Practitioner | Admitting: Nurse Practitioner

## 2021-10-13 VITALS — BP 119/88 | HR 82 | Temp 97.9°F | Resp 16 | Ht 66.5 in | Wt 153.5 lb

## 2021-10-13 DIAGNOSIS — Z01818 Encounter for other preprocedural examination: Secondary | ICD-10-CM | POA: Insufficient documentation

## 2021-10-13 DIAGNOSIS — C210 Malignant neoplasm of anus, unspecified: Secondary | ICD-10-CM | POA: Diagnosis not present

## 2021-10-13 DIAGNOSIS — N6489 Other specified disorders of breast: Secondary | ICD-10-CM | POA: Diagnosis not present

## 2021-10-13 DIAGNOSIS — R928 Other abnormal and inconclusive findings on diagnostic imaging of breast: Secondary | ICD-10-CM | POA: Diagnosis not present

## 2021-10-13 DIAGNOSIS — R739 Hyperglycemia, unspecified: Secondary | ICD-10-CM | POA: Insufficient documentation

## 2021-10-13 LAB — CBC
HCT: 41 % (ref 36.0–46.0)
Hemoglobin: 13.3 g/dL (ref 12.0–15.0)
MCH: 33.8 pg (ref 26.0–34.0)
MCHC: 32.4 g/dL (ref 30.0–36.0)
MCV: 104.3 fL — ABNORMAL HIGH (ref 80.0–100.0)
Platelets: 193 10*3/uL (ref 150–400)
RBC: 3.93 MIL/uL (ref 3.87–5.11)
RDW: 12.5 % (ref 11.5–15.5)
WBC: 7 10*3/uL (ref 4.0–10.5)
nRBC: 0 % (ref 0.0–0.2)

## 2021-10-13 LAB — HEMOGLOBIN A1C
Hgb A1c MFr Bld: 5.7 % — ABNORMAL HIGH (ref 4.8–5.6)
Mean Plasma Glucose: 116.89 mg/dL

## 2021-10-13 IMAGING — MR MR BREAST BILAT WO/W CM
8 of 11 series · 28 of 48 positions shown · IV contrast (7ml GADAVIST)
Comparison: Previous exam(s).

CLINICAL DATA: Patient was diagnosed with anal carcinoma early and
[CL]. Staging PET-CT demonstrated a small focal area of uptake along
the posterior fibroglandular tissue of the upper inner right breast.
She underwent diagnostic imaging of the right breast on [DATE]
with no mammographic or sonographic evidence of malignancy or
correlate to the abnormal uptake noted on the PET CT. Follow-up
imaging with breast MRI was recommended due to the PET-CT findings.
On the breast MRI dated [DATE] a 7 mm enhancing mass was
described in the upper inner posterior right breast. This
subsequently underwent MRI guided biopsy on [DATE], with
pathology revealing fibrocystic changes and sclerosing adenosis, as
well as a small focus of inflammation with the suggestion of
resolving fat necrosis. No malignancy or atypia. Current exam is
protocol for follow-up following a benign MRI guided biopsy.

LABS:  No labs drawn at time of imaging.
EXAM:
BILATERAL BREAST MRI WITH AND WITHOUT CONTRAST
TECHNIQUE: Multiplanar, multisequence MR images of both breasts were obtained
prior to and following the intravenous administration of 7 ml of
Gadavist

[Series 2: T2 · axial · 3.0mm · 0.91mm/px · 1 of 69 slices shown]
[im 1/69]
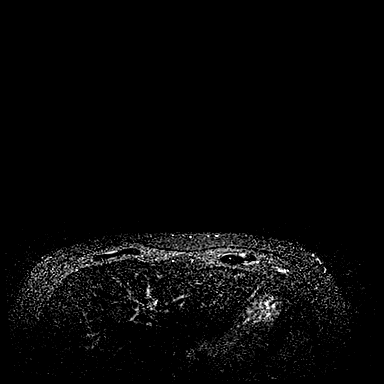

[Series 3: T1 fat-sat · axial · 1.2mm · 0.78mm/px · z∈[-98,+112]mm · 5 of 176 slices shown (1 of 4)]
[im 1/176]
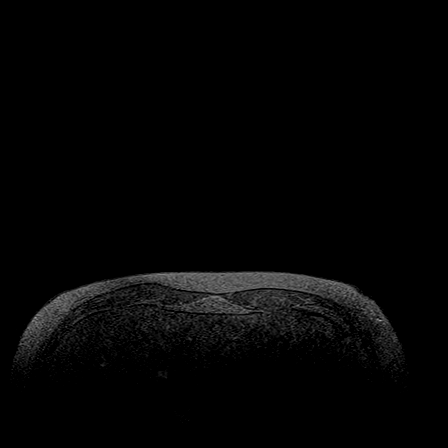
[im 44/176]
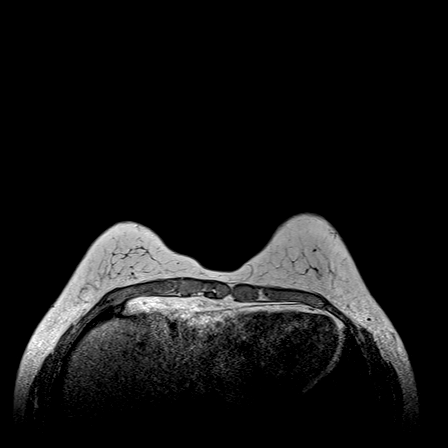
[im 88/176]
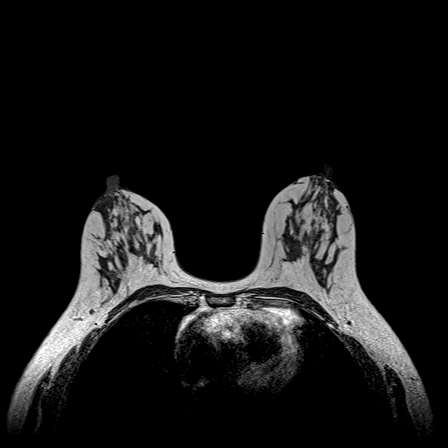
[im 132/176]
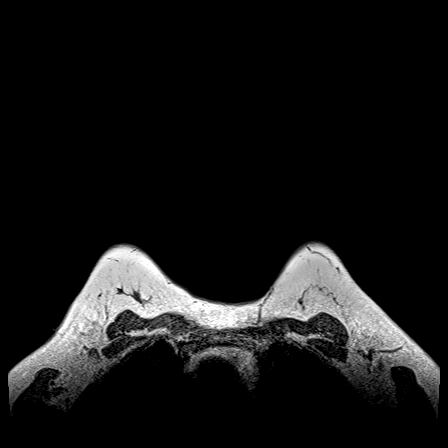
[im 176/176]
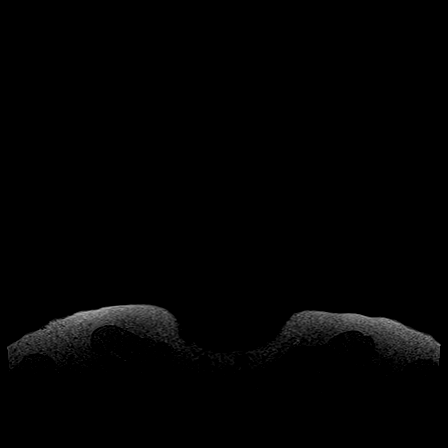

[Series 5: T1 fat-sat · axial · 1.2mm · 0.84mm/px · z∈[-98,+112]mm · 5 of 176 slices shown (2 of 4)]
[im 1/176]
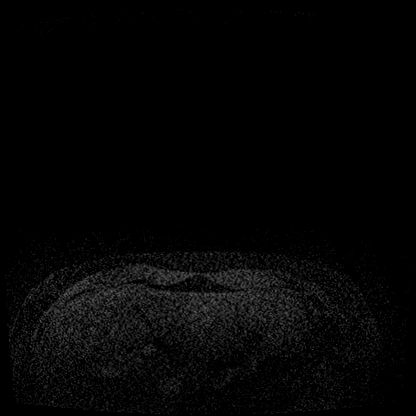
[im 44/176]
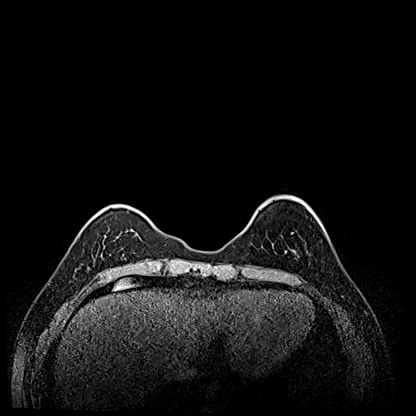
[im 88/176]
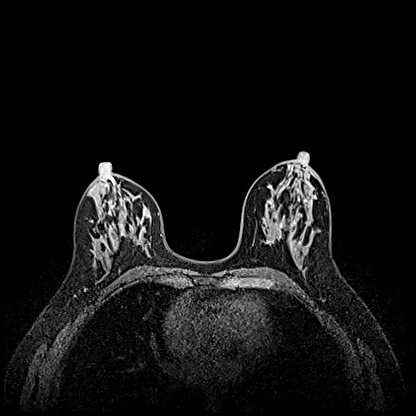
[im 132/176]
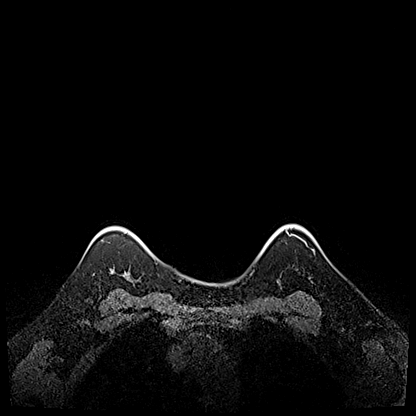
[im 176/176]
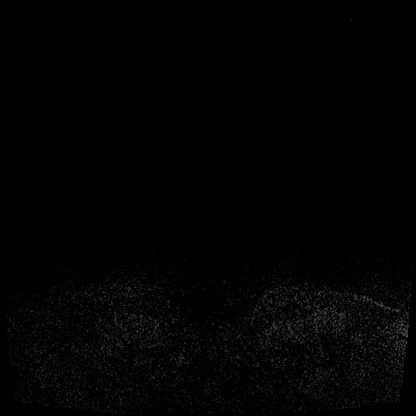

[Series 6: T1 fat-sat · axial · 1.2mm · 0.84mm/px · z∈[-98,+112]mm · 5 of 176 slices shown (3 of 4)]
[im 1/176]
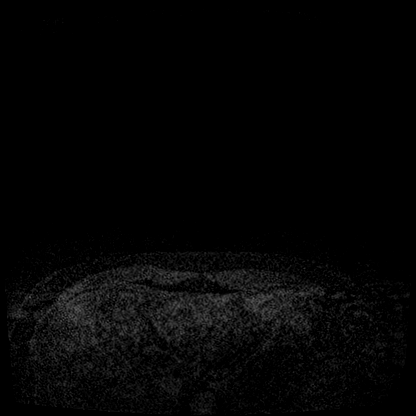
[im 44/176]
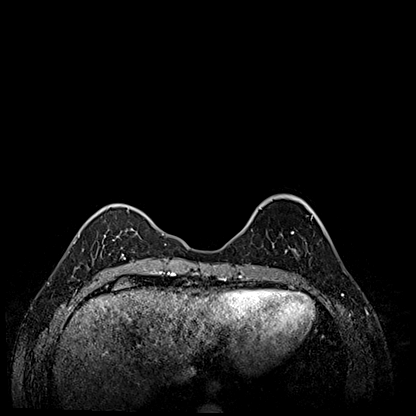
[im 88/176]
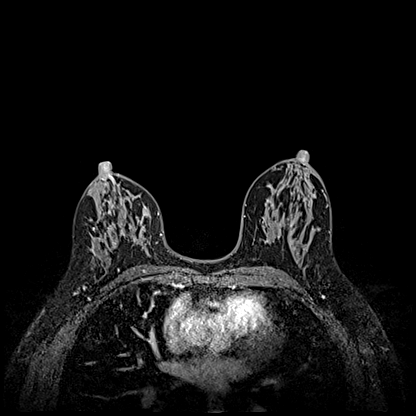
[im 132/176]
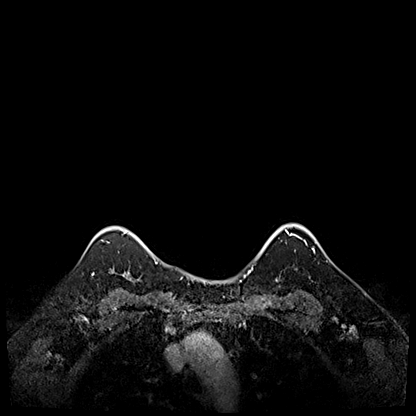
[im 176/176]
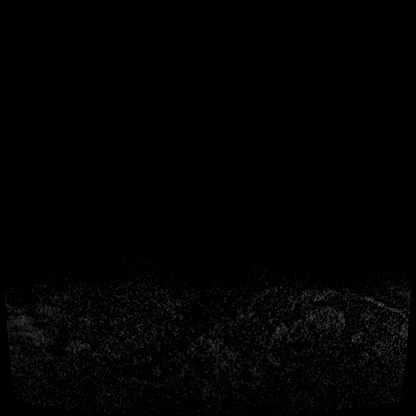

[Series 7: T1 · axial · 1.2mm · 0.84mm/px · z∈[-98,+112]mm · 6 of 176 slices shown (1 of 3)]
[im 1/176]
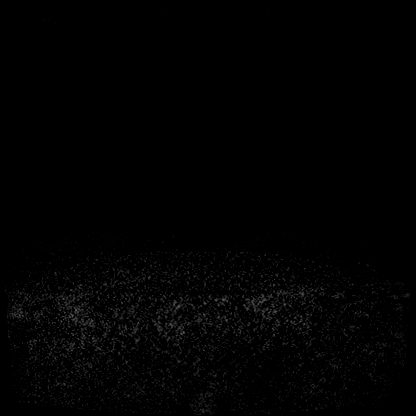
[im 36/176]
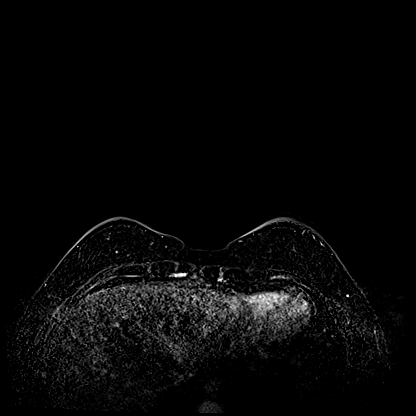
[im 71/176]
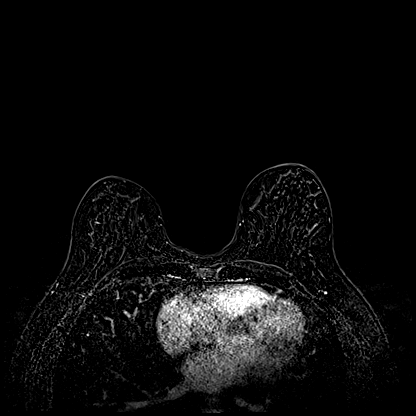
[im 106/176]
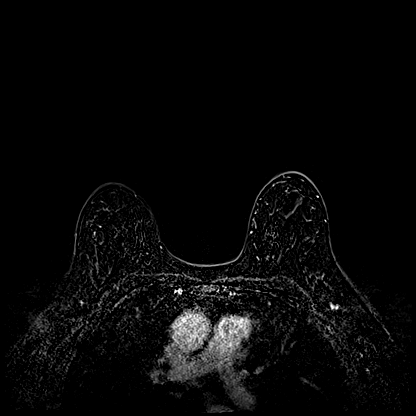
[im 141/176]
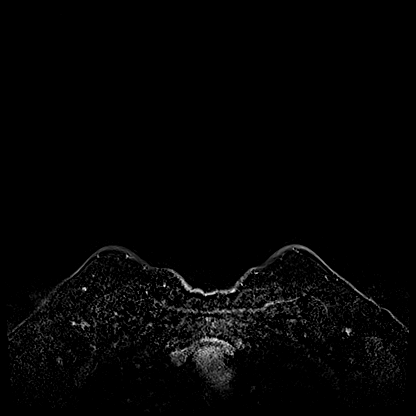
[im 176/176]
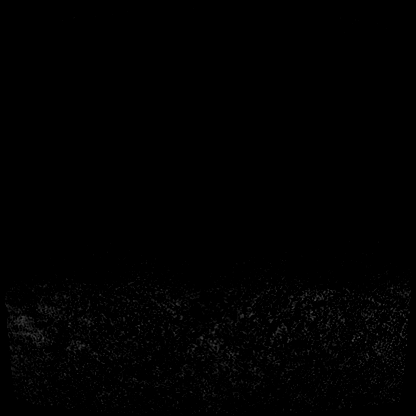

[Series 8: T1 · coronal · 350.0mm · 0.84mm/px · 1 of 3 slices shown (2 of 3)]
[im 1/3]
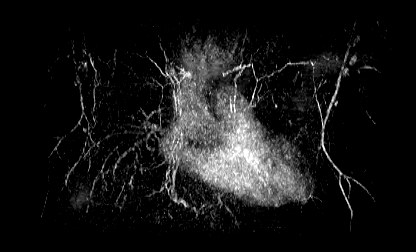

[Series 9: T1 · axial · 211.2mm · 0.84mm/px · 1 of 3 slices shown (3 of 3)]
[im 1/3]
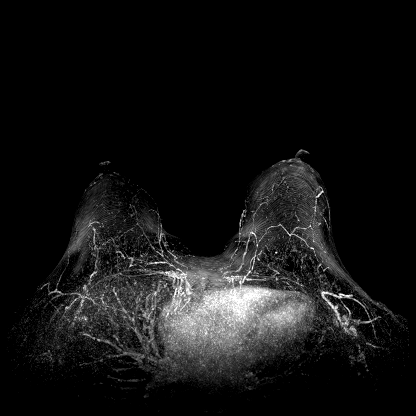

[Series 10: T1 fat-sat · axial · 1.2mm · 0.84mm/px · z∈[-98,+28]mm · 4 of 176 slices shown (4 of 4)]
[im 1/176]
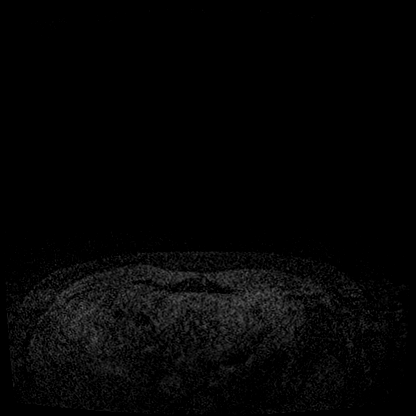
[im 36/176]
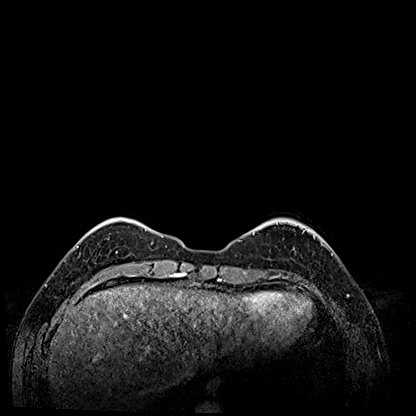
[im 71/176]
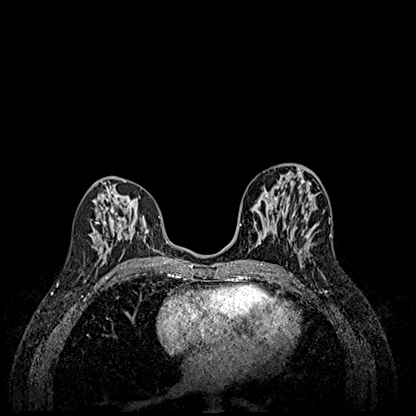
[im 106/176]
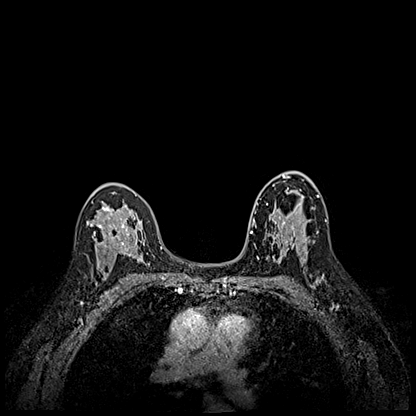

[28 of 48 positions shown; findings below may reference images not displayed]

Three-dimensional MR images were rendered by post-processing of the
original MR data on an independent workstation. The
three-dimensional MR images were interpreted, and findings are
reported in the following complete MRI report for this study. Three
dimensional images were evaluated at the independent interpreting
workstation using the DynaCAD thin client.
FINDINGS: Breast composition: c. Heterogeneous fibroglandular tissue.

Background parenchymal enhancement: Mild focus of susceptibility

Right breast: No mass or abnormal enhancement. Artifact in the
posterior, slightly medial aspect the breast, just above midline,
centered on image 79, series 3, consistent with the post biopsy
marker clip from the prior benign biopsy. There is no associated
abnormal enhancement.

Left breast: No mass or abnormal enhancement.

Lymph nodes: No abnormal appearing lymph nodes.

Ancillary findings:  None.
IMPRESSION: 1. No evidence of breast malignancy. No current mass or abnormal
enhancement seen adjacent to the post biopsy marker clip in the
posterior, upper inner right breast.

RECOMMENDATION:
1. Annual screening mammography.

BI-RADS CATEGORY  1: Negative.

## 2021-10-13 MED ORDER — GADOBUTROL 1 MMOL/ML IV SOLN
7.0000 mL | Freq: Once | INTRAVENOUS | Status: AC | PRN
  Administered 2021-10-13: 7 mL via INTRAVENOUS

## 2021-10-13 NOTE — Consult Note (Signed)
[facility] Nurse requested for preoperative stoma site marking  Discussed surgical procedure and stoma creation with patient. Explained role of the [facility] nurse team.  Provided the patient with educational booklet and provided samples of pouching options.  Answered patient questions.   Examined patient sitting, and standing in order to place the marking in the patient's visual field, away from any creases or abdominal contour issues and within the rectus muscle.  Attempted to mark below the patient's belt line.   Marked for colostomy in the LLQ  _4___ cm to the left of the umbilicus and _9___QZ below the umbilicus.  Marked for ileostomy in the RLQ  _4___cm to the right of the umbilicus and  __3__ cm below the umbilicus.     Patient's abdomen cleansed with CHG wipes at site markings, allowed to air dry prior to marking.Covered mark with thin film transparent dressing to preserve mark until date of surgery.   [facility] Nurse team will follow up with patient after surgery for continue ostomy care and teaching.  [place] MSN, [place], [place], [place]

## 2021-10-13 NOTE — Progress Notes (Signed)
COVID Vaccine Completed: Yes Date COVID Vaccine completed: 42767 x 1 COVID vaccine manufacturer: [place] Test: 11/01/21 PCP - Dr. Gar Ponto Cardiologist -   Chest x-ray -  EKG -  Stress Test -  ECHO -  Cardiac Cath -  Pacemaker/ICD device last checked:  Sleep Study -  CPAP -   Fasting Blood Sugar -  Checks Blood Sugar _____ times a day  Blood Thinner Instructions: Aspirin Instructions: Last Dose:  Anesthesia review:   Patient denies shortness of breath, fever, cough and chest pain at PAT appointment   Patient verbalized understanding of instructions that were given to them at the PAT appointment. Patient was also instructed that they will need to review over the PAT instructions again at home before surgery.

## 2021-10-19 ENCOUNTER — Ambulatory Visit: Payer: Self-pay | Admitting: Surgery

## 2021-10-21 ENCOUNTER — Ambulatory Visit (INDEPENDENT_AMBULATORY_CARE_PROVIDER_SITE_OTHER): Payer: BC Managed Care – PPO | Admitting: Obstetrics and Gynecology

## 2021-10-21 ENCOUNTER — Other Ambulatory Visit: Payer: Self-pay

## 2021-10-21 DIAGNOSIS — N951 Menopausal and female climacteric states: Secondary | ICD-10-CM | POA: Diagnosis not present

## 2021-10-21 MED ORDER — GABAPENTIN 300 MG PO CAPS: 300.0000 mg | ORAL_CAPSULE | Freq: Three times a day (TID) | ORAL | 3 refills | Status: DC

## 2021-10-21 NOTE — Progress Notes (Signed)
I connected with Beth [name] on 10/21/21 at 10:10 AM EDT by telephone and verified that I am speaking with the correct person using two identifiers.   I discussed the limitations, risks, security and privacy concerns of performing an evaluation and management service by telephone and the availability of in person appointments. I also discussed with the patient that there may be a patient responsible charge related to this service. The patient expressed understanding and agreed to proceed.  The patient was at home I spoke with the patient from my workstation phone The names of people involved in this encounter were: [name] , and [facility] Office Visit   Chief Complaint:  Chief Complaint  Patient presents with   Follow-up    Phone visit - still experiencing hot flashes during the day, can't really tell at night because she is taking pain meds to sleep.    History of Present Illness: 50 y.o. G3P3 presenting for medication follow up for a diagnosis of vasomotor symptoms associated with menopause.  She is currently being managed with gabapentin.   The patient reports improvement in symptoms but continued night time vasomotor symptoms, although some of her sleep disturbances are also pain related .  On her current medication regimen has not had menstrual issues secondary to postmenopausal status.   She has not noted any side-effects or new symptoms.    I scheduled for surgery secondary to concern for recurrence anal/rectal cancer  Review of Systems: Review of Systems  Constitutional: Negative.   Gastrointestinal: Negative.     Past Medical History:  Past Medical History:  Diagnosis Date   Acne    on face spirolactone for   Anal cancer ([place]) 02/25/2021   Constipation, chronic 08/08/2016   History of migraine    none in years   Wears glasses     Past Surgical History:  Past Surgical History:  Procedure Laterality Date   ABDOMINAL  HYSTERECTOMY  2016   partial   COLONOSCOPY     COLONOSCOPY WITH PROPOFOL N/A 01/28/2021   Procedure: COLONOSCOPY WITH PROPOFOL;  Surgeon: Virgel Manifold, MD;  Location: [facility] END[facility];  Service: Endoscopy;  Laterality: N/A;   EXCISION HYDRADENITIS LABIA Left 02/25/2021   Procedure: EXCISION of  LABIAL LESIOM;  Surgeon: Michael Boston, MD;  Location: [facility];  Service: General;  Laterality: Left;   IR FLUORO GUIDE CV LINE RIGHT  04/18/2021   RECTAL BIOPSY N/A 09/30/2021   Procedure: ANORECTAL MASS BIOPSY;  Surgeon: Michael Boston, MD;  Location: [facility];  Service: General;  Laterality: N/A;   RECTAL EXAM UNDER ANESTHESIA N/A 02/25/2021   Procedure: ANORECTAL EXAM UNDER ANESTHESIA;  Surgeon: Michael Boston, MD;  Location: [facility];  Service: General;  Laterality: N/A;   RECTAL EXAM UNDER ANESTHESIA N/A 09/30/2021   Procedure: ANORECTAL EXAMINATION UNDER ANESTHESIA;  Surgeon: Michael Boston, MD;  Location: [facility];  Service: General;  Laterality: N/A;  GEN & LOCAL ANESTHESIA   SPHINCTEROTOMY N/A 08/30/2016   Procedure: LATERAL INTERNAL AND SPHINCTEROTOMY;  Surgeon: Michael Boston, MD;  Location: [facility];  Service: General;  Laterality: N/A;   TONSILLECTOMY  as child   TRANSANAL EXCISION OF RECTAL MASS N/A 02/25/2021   Procedure: TRANSANAL EXCISIONAL  BIOPSY OF ANAL CANAL MASS;  Surgeon: Michael Boston, MD;  Location: [facility];  Service: General;  Laterality: N/A;    Gynecologic History: Patient's last menstrual period was 03/13/2012.  Obstetric History: G3P3  Family History:  Family History  Problem Relation Age of Onset   Breast cancer Paternal Aunt     Social History:  Social History   Socioeconomic History   Marital status: Married    Spouse name: Not on file   Number of children: Not on file   Years of education: Not on file   Highest education level: Not on file  Occupational History   Not on file  Tobacco Use   Smoking status:  Former    Packs/day: 1.00    Years: 8.00    Pack years: 8.00    Types: Cigarettes    Quit date: 02/15/2021    Years since quitting: 0.6   Smokeless tobacco: Never  Vaping Use   Vaping Use: Never used  Substance and Sexual Activity   Alcohol use: Yes    Comment: 2 dirnks daily   Drug use: No   Sexual activity: Yes    Birth control/protection: Surgical    Comment: Hysterectomy  Other Topics Concern   Not on file  Social History Narrative   Not on file   Social Determinants of Health   Financial Resource Strain: Not on file  Food Insecurity: Not on file  Transportation Needs: Not on file  Physical Activity: Not on file  Stress: Not on file  Social Connections: Not on file  Intimate Partner Violence: Not on file    Allergies:  Allergies  Allergen Reactions   Clindamycin/Lincomycin Rash   Sulfa Drugs Cross Reactors Itching and Rash    Medications: Prior to Admission medications   Medication Sig Start Date End Date Taking? Authorizing Provider  Biotin w/ Vitamins C & E (HAIR SKIN & NAILS GUMMIES PO) Take 2 tablets by mouth daily. Gummies   Yes [provider]  bisacodyl (DULCOLAX) 5 MG EC tablet Take by mouth. 10/19/21  Yes [provider]  cetirizine (ZYRTEC) 10 MG tablet Take 10 mg by mouth daily.   Yes [provider]  oxyCODONE (OXY IR/ROXICODONE) 5 MG immediate release tablet Take 1-2 tablets (5-10 mg total) by mouth every 6 (six) hours as needed for moderate pain, severe pain or breakthrough pain. 09/30/21  Yes Michael Boston, MD  spironolactone (ALDACTONE) 100 MG tablet Take 100 mg by mouth daily.   Yes [provider]  triamcinolone cream (KENALOG) 0.1 % Apply 1 application topically 4 (four) times daily as needed (eczema). 03/30/21  Yes [provider]  gabapentin (NEURONTIN) 300 MG capsule Take 1 capsule (300 mg total) by mouth 2 (two) times daily for 3 days. 1 tablet po once daily on day 1, 1 tablet po bid day 2, then 1  tablet po tid Patient taking differently: Take 300 mg by mouth 2 (two) times daily. 09/12/21 09/30/21  [name], MD  metroNIDAZOLE (FLAGYL) 500 MG tablet Take by mouth. 10/19/21   [provider]  neomycin (MYCIFRADIN) 500 MG tablet Take 1,000 mg by mouth 3 (three) times daily. 10/19/21   [provider]    Physical Exam Vitals: There were no vitals filed for this visit. Patient's last menstrual period was 03/13/2012.  No physical exam as this was a remote telephone visit to promote social distancing during the current COVID-19 Pandemic   Assessment: 50 y.o. G3P3 medication follow up vasomotor symptoms  Plan: Problem List Items Addressed This Visit   None Visit Diagnoses     Vasomotor symptoms due to menopause    -  Primary      1) increase neurontin to  300mg  tid, discussed addition of paroxitine if inadequate response given anal cancer conern with estrogen is DVT/PE risk as well as worsening of patient's migraines  2) Telephone time 8:00 min  3) No follow-ups on file.   [name], MD, [facility], [facility] 10/21/2021, 10:41 AM

## 2021-10-26 DIAGNOSIS — C211 Malignant neoplasm of anal canal: Secondary | ICD-10-CM | POA: Diagnosis not present

## 2021-10-26 DIAGNOSIS — Z923 Personal history of irradiation: Secondary | ICD-10-CM | POA: Diagnosis not present

## 2021-10-26 NOTE — H&P (Signed)
Subjective:     Patient ID: Beth [name] is a 50 y.o. female.   HPI   Here for follow up discussion flap reconstruction perineum at time APR. 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 PET scan 03/16/2021 showed mass within the anal canal and a focal site of uptake within the inner quadrant of the right breast. Completed chemoradiation.    Reported rectal bleeding 10.2022. Exam with recurrent mass. Completed EUA with biopsy poorly differentiated carcinoma. APR recommended.   With regards to breast mass, MMG/US normal. MRI 03/2021 showed 7 mm mass in the upper inner right breast correlating with the PET findings. Biopsy benign and repeat breast MRI 6 mo recommended. This is scheduled for 10.27.22.   Patient quit smoking at time of cancer diagnosis March 2022.   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 Lives with spouse who works in [employer].   Review of Systems  Gastrointestinal: Positive for anal bleeding and rectal pain.    Remainder 12 point review negative    Objective:   Physical Exam Cardiovascular:     Rate and Rhythm: Normal rate and regular rhythm.     Heart sounds: Normal heart sounds.  Pulmonary:     Effort: Pulmonary effort is normal.     Breath sounds: Normal breath sounds.     Abd: soft no scars    Assessment:     SCC anus Chemoradiation    Plan:     Reviewed  VRAM incisions,  harvest of both skin and muscle, drains, need for laparotomy to deliver to rectum. Discussed purpose of surgery is to improve chances of wound healing in setting of radiation, fill up anticipated dead space in pelvis. Risks include failure flap, abdominal hernia. Counseled patient I anticipate 100% chance wound healing problems over perineum, however the flap improves overall chances healing.    We discussed tumor resection may require resection vaginal wall. In this setting muscle of flap would also be used to reconstruct vaginal wall. This may permanently affect  ability to have sex, may require dilation vagina post operatively to allow for sexual activity.   Reviewed typical hospital stay, drains. Reviewed drainage perineum post surgery will be increased in short term.    Reviewed additional risks including but not limited to hernia abdomen, recurrence, cardiopulmonary complications, bleeding, hematoma, seroma, infection, failure flap, need for additional procedures, blood clots in legs or lungs. Complications can mean additional hospitalization, additional surgery, prolonged healing.

## 2021-11-01 ENCOUNTER — Other Ambulatory Visit: Payer: Self-pay | Admitting: Surgery

## 2021-11-01 LAB — SARS CORONAVIRUS 2 (TAT 6-24 HRS): SARS Coronavirus 2: NEGATIVE

## 2021-11-02 NOTE — Anesthesia Preprocedure Evaluation (Addendum)
Anesthesia Evaluation  Patient identified by MRN, date of birth, ID band Patient awake    Reviewed: Allergy & Precautions, NPO status , Patient's Chart, lab work & pertinent test results  History of Anesthesia Complications Negative for: history of anesthetic complications  Airway Mallampati: II  TM Distance: >3 FB Neck ROM: Full    Dental  (+) Dental Advisory Given, Teeth Intact   Pulmonary neg shortness of breath, neg sleep apnea, neg COPD, neg recent URI, former smoker,    breath sounds clear to auscultation       Cardiovascular Exercise Tolerance: Good negative cardio ROS   Rhythm:Regular     Neuro/Psych negative neurological ROS  negative psych ROS   GI/Hepatic Neg liver ROS, Anal cancer   Endo/Other  negative endocrine ROS  Renal/GU negative Renal ROS  negative genitourinary   Musculoskeletal negative musculoskeletal ROS (+)   Abdominal   Peds negative pediatric ROS (+)  Hematology negative hematology ROS (+)   Anesthesia Other Findings   Reproductive/Obstetrics negative OB ROS                            Anesthesia Physical  Anesthesia Plan  ASA: 3  Anesthesia Plan: General   Post-op Pain Management:    Induction: Intravenous  PONV Risk Score and Plan: 3 and Ondansetron, Dexamethasone, Scopolamine patch - Pre-op, Midazolam and Treatment may vary due to age or medical condition  Airway Management Planned: Oral ETT  Additional Equipment: None  Intra-op Plan:   Post-operative Plan: Extubation in OR  Informed Consent: I have reviewed the patients History and Physical, chart, labs and discussed the procedure including the risks, benefits and alternatives for the proposed anesthesia with the patient or authorized representative who has indicated his/her understanding and acceptance.     Dental advisory given  Plan Discussed with: CRNA and  Anesthesiologist  Anesthesia Plan Comments:        Anesthesia Quick Evaluation

## 2021-11-03 ENCOUNTER — Encounter (HOSPITAL_COMMUNITY): Payer: Self-pay | Admitting: Surgery

## 2021-11-03 ENCOUNTER — Inpatient Hospital Stay (HOSPITAL_COMMUNITY): Payer: BC Managed Care – PPO | Admitting: Certified Registered Nurse Anesthetist

## 2021-11-03 ENCOUNTER — Encounter (HOSPITAL_COMMUNITY)
Admission: RE | Disposition: A | Discharge status: Home Health | Payer: Self-pay | Source: Home / Self Care | Attending: Surgery

## 2021-11-03 ENCOUNTER — Inpatient Hospital Stay (HOSPITAL_COMMUNITY)
Admission: RE | Admit: 2021-11-03 | Discharge: 2021-11-07 | DRG: 331 | Disposition: A | Discharge status: Home Health | Payer: BC Managed Care – PPO | Attending: Surgery | Admitting: Surgery

## 2021-11-03 ENCOUNTER — Other Ambulatory Visit: Payer: Self-pay

## 2021-11-03 DIAGNOSIS — C211 Malignant neoplasm of anal canal: Secondary | ICD-10-CM | POA: Diagnosis not present

## 2021-11-03 DIAGNOSIS — Z923 Personal history of irradiation: Secondary | ICD-10-CM

## 2021-11-03 DIAGNOSIS — C19 Malignant neoplasm of rectosigmoid junction: Secondary | ICD-10-CM | POA: Diagnosis not present

## 2021-11-03 DIAGNOSIS — Z87891 Personal history of nicotine dependence: Secondary | ICD-10-CM

## 2021-11-03 DIAGNOSIS — C210 Malignant neoplasm of anus, unspecified: Secondary | ICD-10-CM | POA: Diagnosis not present

## 2021-11-03 DIAGNOSIS — Z9221 Personal history of antineoplastic chemotherapy: Secondary | ICD-10-CM | POA: Diagnosis not present

## 2021-11-03 DIAGNOSIS — R110 Nausea: Secondary | ICD-10-CM | POA: Diagnosis not present

## 2021-11-03 DIAGNOSIS — Z803 Family history of malignant neoplasm of breast: Secondary | ICD-10-CM

## 2021-11-03 DIAGNOSIS — Z90711 Acquired absence of uterus with remaining cervical stump: Secondary | ICD-10-CM | POA: Diagnosis not present

## 2021-11-03 DIAGNOSIS — Z881 Allergy status to other antibiotic agents status: Secondary | ICD-10-CM

## 2021-11-03 DIAGNOSIS — C218 Malignant neoplasm of overlapping sites of rectum, anus and anal canal: Secondary | ICD-10-CM | POA: Diagnosis not present

## 2021-11-03 DIAGNOSIS — K649 Unspecified hemorrhoids: Secondary | ICD-10-CM | POA: Diagnosis present

## 2021-11-03 HISTORY — PX: PECTORALIS FLAP: SHX6228

## 2021-11-03 HISTORY — PX: XI ROBOT ABDOMINAL PERINEAL RESECTION: SHX6709

## 2021-11-03 HISTORY — PX: OSTOMY: SHX5997

## 2021-11-03 LAB — TYPE AND SCREEN
ABO/RH(D): O POS
Antibody Screen: NEGATIVE

## 2021-11-03 LAB — ABO/RH: ABO/RH(D): O POS

## 2021-11-03 SURGERY — RESECTION, ABDOMINOPERINEAL, ROBOT-ASSISTED
Anesthesia: General

## 2021-11-03 MED ORDER — ACETAMINOPHEN 500 MG PO TABS
1000.0000 mg | ORAL_TABLET | ORAL | Status: DC
  Filled 2021-11-03: qty 2

## 2021-11-03 MED ORDER — PHENYLEPHRINE HCL (PRESSORS) 10 MG/ML IV SOLN
INTRAVENOUS | Status: AC
  Filled 2021-11-03: qty 2

## 2021-11-03 MED ORDER — BUPIVACAINE LIPOSOME 1.3 % IJ SUSP
INTRAMUSCULAR | Status: DC | PRN
  Administered 2021-11-03: 20 mL

## 2021-11-03 MED ORDER — STERILE WATER FOR INJECTION IJ SOLN
INTRAMUSCULAR | Status: DC | PRN
  Administered 2021-11-03: 08:00:00 15 mL via INTRAMUSCULAR

## 2021-11-03 MED ORDER — LIDOCAINE 2% (20 MG/ML) 5 ML SYRINGE
INTRAMUSCULAR | Status: DC | PRN
  Administered 2021-11-03: 50 mg via INTRAVENOUS

## 2021-11-03 MED ORDER — PROCHLORPERAZINE MALEATE 10 MG PO TABS
10.0000 mg | ORAL_TABLET | Freq: Four times a day (QID) | ORAL | Status: DC | PRN
  Filled 2021-11-03: qty 1

## 2021-11-03 MED ORDER — ROCURONIUM BROMIDE 10 MG/ML (PF) SYRINGE
PREFILLED_SYRINGE | INTRAVENOUS | Status: DC | PRN
Start: 2021-11-03 — End: 2021-11-03
  Administered 2021-11-03: 20 mg via INTRAVENOUS
  Administered 2021-11-03 (×2): 30 mg via INTRAVENOUS
  Administered 2021-11-03: 80 mg via INTRAVENOUS

## 2021-11-03 MED ORDER — METHOCARBAMOL 500 MG PO TABS
1000.0000 mg | ORAL_TABLET | Freq: Four times a day (QID) | ORAL | Status: DC | PRN
  Administered 2021-11-06 – 2021-11-07 (×2): 1000 mg via ORAL
  Filled 2021-11-03 (×2): qty 2

## 2021-11-03 MED ORDER — MIDAZOLAM HCL 5 MG/5ML IJ SOLN
INTRAMUSCULAR | Status: DC | PRN
Start: 2021-11-03 — End: 2021-11-03
  Administered 2021-11-03: 2 mg via INTRAVENOUS

## 2021-11-03 MED ORDER — TRIAMCINOLONE ACETONIDE 0.1 % EX CREA
1.0000 "application " | TOPICAL_CREAM | Freq: Four times a day (QID) | CUTANEOUS | Status: DC | PRN
  Filled 2021-11-03: qty 15

## 2021-11-03 MED ORDER — HYDRALAZINE HCL 20 MG/ML IJ SOLN
INTRAMUSCULAR | Status: DC | PRN
  Administered 2021-11-03 (×2): 4 mg via INTRAVENOUS

## 2021-11-03 MED ORDER — BISACODYL 5 MG PO TBEC: 20.0000 mg | DELAYED_RELEASE_TABLET | Freq: Once | ORAL | Status: DC

## 2021-11-03 MED ORDER — HYDROMORPHONE HCL 1 MG/ML IJ SOLN
0.2500 mg | INTRAMUSCULAR | Status: DC | PRN
  Administered 2021-11-03 (×3): 0.25 mg via INTRAVENOUS

## 2021-11-03 MED ORDER — ROCURONIUM BROMIDE 10 MG/ML (PF) SYRINGE
PREFILLED_SYRINGE | INTRAVENOUS | Status: AC
  Filled 2021-11-03: qty 10

## 2021-11-03 MED ORDER — HYDROMORPHONE HCL 1 MG/ML IJ SOLN
INTRAMUSCULAR | Status: AC
  Administered 2021-11-03: 13:00:00 0.25 mg via INTRAVENOUS
  Filled 2021-11-03: qty 1

## 2021-11-03 MED ORDER — LACTATED RINGERS IV SOLN: INTRAVENOUS | Status: DC

## 2021-11-03 MED ORDER — DEXAMETHASONE SODIUM PHOSPHATE 10 MG/ML IJ SOLN
INTRAMUSCULAR | Status: AC
  Filled 2021-11-03: qty 1

## 2021-11-03 MED ORDER — DIPHENHYDRAMINE HCL 12.5 MG/5ML PO ELIX: 12.5000 mg | ORAL_SOLUTION | Freq: Four times a day (QID) | ORAL | Status: DC | PRN

## 2021-11-03 MED ORDER — POLYETHYLENE GLYCOL 3350 17 GM/SCOOP PO POWD: 1.0000 | Freq: Once | ORAL | Status: DC

## 2021-11-03 MED ORDER — SODIUM CHLORIDE 0.9 % IV SOLN: Freq: Three times a day (TID) | INTRAVENOUS | Status: DC | PRN

## 2021-11-03 MED ORDER — ALVIMOPAN 12 MG PO CAPS
12.0000 mg | ORAL_CAPSULE | Freq: Two times a day (BID) | ORAL | Status: DC
  Administered 2021-11-04 – 2021-11-06 (×5): 12 mg via ORAL
  Filled 2021-11-03 (×6): qty 1

## 2021-11-03 MED ORDER — SODIUM CHLORIDE 0.9 % IR SOLN
Status: DC | PRN
  Administered 2021-11-03: 1000 mL

## 2021-11-03 MED ORDER — ONDANSETRON HCL 4 MG PO TABS: 4.0000 mg | ORAL_TABLET | Freq: Four times a day (QID) | ORAL | Status: DC | PRN

## 2021-11-03 MED ORDER — BUPIVACAINE LIPOSOME 1.3 % IJ SUSP
INTRAMUSCULAR | Status: AC
  Filled 2021-11-03: qty 20

## 2021-11-03 MED ORDER — ALVIMOPAN 12 MG PO CAPS
12.0000 mg | ORAL_CAPSULE | ORAL | Status: AC
  Administered 2021-11-03: 06:00:00 12 mg via ORAL
  Filled 2021-11-03: qty 1

## 2021-11-03 MED ORDER — NEOMYCIN SULFATE 500 MG PO TABS: 1000.0000 mg | ORAL_TABLET | ORAL | Status: DC

## 2021-11-03 MED ORDER — ACETAMINOPHEN 10 MG/ML IV SOLN
1000.0000 mg | Freq: Once | INTRAVENOUS | Status: AC
  Administered 2021-11-03: 11:00:00 1000 mg via INTRAVENOUS

## 2021-11-03 MED ORDER — METOPROLOL TARTRATE 5 MG/5ML IV SOLN: 5.0000 mg | Freq: Four times a day (QID) | INTRAVENOUS | Status: DC | PRN

## 2021-11-03 MED ORDER — LORATADINE 10 MG PO TABS
10.0000 mg | ORAL_TABLET | Freq: Every day | ORAL | Status: DC
  Administered 2021-11-04 – 2021-11-07 (×4): 10 mg via ORAL
  Filled 2021-11-03 (×4): qty 1

## 2021-11-03 MED ORDER — HYDROMORPHONE HCL 1 MG/ML IJ SOLN
INTRAMUSCULAR | Status: DC | PRN
  Administered 2021-11-03 (×2): 1 mg via INTRAVENOUS

## 2021-11-03 MED ORDER — MAGIC MOUTHWASH
15.0000 mL | Freq: Four times a day (QID) | ORAL | Status: DC | PRN
  Filled 2021-11-03: qty 15

## 2021-11-03 MED ORDER — LABETALOL HCL 5 MG/ML IV SOLN: 5.0000 mg | INTRAVENOUS | Status: DC | PRN

## 2021-11-03 MED ORDER — PHENYLEPHRINE HCL-NACL 20-0.9 MG/250ML-% IV SOLN
INTRAVENOUS | Status: DC | PRN
  Administered 2021-11-03: 40 ug/min via INTRAVENOUS

## 2021-11-03 MED ORDER — ALBUMIN HUMAN 5 % IV SOLN
INTRAVENOUS | Status: AC
  Filled 2021-11-03: qty 250

## 2021-11-03 MED ORDER — LACTATED RINGERS IR SOLN
Status: DC | PRN
  Administered 2021-11-03: 1000 mL

## 2021-11-03 MED ORDER — METHOCARBAMOL 500 MG IVPB - SIMPLE MED
INTRAVENOUS | Status: AC
  Administered 2021-11-03: 14:00:00 500 mg
  Filled 2021-11-03: qty 50

## 2021-11-03 MED ORDER — GABAPENTIN 300 MG PO CAPS
300.0000 mg | ORAL_CAPSULE | ORAL | Status: AC
  Administered 2021-11-03: 06:00:00 300 mg via ORAL
  Filled 2021-11-03: qty 1

## 2021-11-03 MED ORDER — ALUM & MAG HYDROXIDE-SIMETH 200-200-20 MG/5ML PO SUSP: 30.0000 mL | Freq: Four times a day (QID) | ORAL | Status: DC | PRN

## 2021-11-03 MED ORDER — SCOPOLAMINE 1 MG/3DAYS TD PT72
MEDICATED_PATCH | TRANSDERMAL | Status: AC
  Administered 2021-11-03: 06:00:00 1.5 mg via TRANSDERMAL
  Filled 2021-11-03: qty 1

## 2021-11-03 MED ORDER — KETAMINE HCL 10 MG/ML IJ SOLN
INTRAMUSCULAR | Status: AC
  Filled 2021-11-03: qty 1

## 2021-11-03 MED ORDER — ENSURE SURGERY PO LIQD
237.0000 mL | Freq: Two times a day (BID) | ORAL | Status: DC
  Administered 2021-11-04 – 2021-11-06 (×4): 237 mL via ORAL

## 2021-11-03 MED ORDER — ALBUMIN HUMAN 5 % IV SOLN: INTRAVENOUS | Status: DC | PRN

## 2021-11-03 MED ORDER — SCOPOLAMINE 1 MG/3DAYS TD PT72: 1.0000 | MEDICATED_PATCH | TRANSDERMAL | Status: DC

## 2021-11-03 MED ORDER — ENOXAPARIN SODIUM 40 MG/0.4ML IJ SOSY
40.0000 mg | PREFILLED_SYRINGE | INTRAMUSCULAR | Status: DC
  Administered 2021-11-04 – 2021-11-07 (×4): 40 mg via SUBCUTANEOUS
  Filled 2021-11-03 (×4): qty 0.4

## 2021-11-03 MED ORDER — BIOTIN W/ VITAMINS C & E 1250-7.5-7.5 MCG-MG-UNT PO CHEW: CHEWABLE_TABLET | Freq: Every day | ORAL | Status: DC

## 2021-11-03 MED ORDER — ONDANSETRON HCL 4 MG/2ML IJ SOLN
INTRAMUSCULAR | Status: AC
  Filled 2021-11-03: qty 2

## 2021-11-03 MED ORDER — METHOCARBAMOL 1000 MG/10ML IJ SOLN
1000.0000 mg | Freq: Four times a day (QID) | INTRAVENOUS | Status: DC | PRN
  Administered 2021-11-06: 1000 mg via INTRAVENOUS
  Filled 2021-11-03: qty 1000
  Filled 2021-11-03: qty 10

## 2021-11-03 MED ORDER — FENTANYL CITRATE (PF) 100 MCG/2ML IJ SOLN
INTRAMUSCULAR | Status: DC | PRN
  Administered 2021-11-03 (×2): 50 ug via INTRAVENOUS
  Administered 2021-11-03 (×2): 100 ug via INTRAVENOUS
  Administered 2021-11-03 (×3): 50 ug via INTRAVENOUS

## 2021-11-03 MED ORDER — SODIUM CHLORIDE 0.9 % IV SOLN
2.0000 g | INTRAVENOUS | Status: AC
  Administered 2021-11-03: 08:00:00 2 g via INTRAVENOUS
  Filled 2021-11-03: qty 2

## 2021-11-03 MED ORDER — MIDAZOLAM HCL 2 MG/2ML IJ SOLN
INTRAMUSCULAR | Status: AC
  Filled 2021-11-03: qty 2

## 2021-11-03 MED ORDER — OXYCODONE HCL 5 MG PO TABS
5.0000 mg | ORAL_TABLET | Freq: Four times a day (QID) | ORAL | Status: DC | PRN
  Administered 2021-11-03 – 2021-11-07 (×9): 10 mg via ORAL
  Filled 2021-11-03 (×9): qty 2

## 2021-11-03 MED ORDER — SUGAMMADEX SODIUM 200 MG/2ML IV SOLN
INTRAVENOUS | Status: DC | PRN
  Administered 2021-11-03: 200 mg via INTRAVENOUS

## 2021-11-03 MED ORDER — OXYCODONE HCL 5 MG PO TABS
ORAL_TABLET | ORAL | Status: AC
  Filled 2021-11-03: qty 2

## 2021-11-03 MED ORDER — ONDANSETRON HCL 4 MG/2ML IJ SOLN
4.0000 mg | Freq: Four times a day (QID) | INTRAMUSCULAR | Status: DC | PRN
  Administered 2021-11-05: 4 mg via INTRAVENOUS
  Filled 2021-11-03: qty 2

## 2021-11-03 MED ORDER — ONDANSETRON HCL 4 MG/2ML IJ SOLN
INTRAMUSCULAR | Status: DC | PRN
  Administered 2021-11-03 (×2): 4 mg via INTRAVENOUS

## 2021-11-03 MED ORDER — OXYCODONE HCL 5 MG/5ML PO SOLN: 5.0000 mg | Freq: Once | ORAL | Status: DC | PRN

## 2021-11-03 MED ORDER — PROPOFOL 500 MG/50ML IV EMUL
INTRAVENOUS | Status: DC | PRN
  Administered 2021-11-03: 25 ug/kg/min via INTRAVENOUS

## 2021-11-03 MED ORDER — LIDOCAINE HCL (PF) 2 % IJ SOLN
INTRAMUSCULAR | Status: AC
  Filled 2021-11-03: qty 20

## 2021-11-03 MED ORDER — MELATONIN 3 MG PO TABS: 3.0000 mg | ORAL_TABLET | Freq: Every evening | ORAL | Status: DC | PRN

## 2021-11-03 MED ORDER — LIDOCAINE HCL (PF) 2 % IJ SOLN
INTRAMUSCULAR | Status: DC | PRN
  Administered 2021-11-03: 1 mg/kg/h via INTRADERMAL

## 2021-11-03 MED ORDER — PROCHLORPERAZINE EDISYLATE 10 MG/2ML IJ SOLN: 5.0000 mg | Freq: Four times a day (QID) | INTRAMUSCULAR | Status: DC | PRN

## 2021-11-03 MED ORDER — GABAPENTIN 300 MG PO CAPS
300.0000 mg | ORAL_CAPSULE | Freq: Three times a day (TID) | ORAL | Status: DC
  Administered 2021-11-03 – 2021-11-07 (×11): 300 mg via ORAL
  Filled 2021-11-03 (×11): qty 1

## 2021-11-03 MED ORDER — ORAL CARE MOUTH RINSE: 15.0000 mL | Freq: Once | OROMUCOSAL | Status: AC

## 2021-11-03 MED ORDER — LACTATED RINGERS IV BOLUS: 1000.0000 mL | Freq: Three times a day (TID) | INTRAVENOUS | Status: AC | PRN

## 2021-11-03 MED ORDER — CHLORHEXIDINE GLUCONATE 0.12 % MT SOLN
15.0000 mL | Freq: Once | OROMUCOSAL | Status: AC
  Administered 2021-11-03: 06:00:00 15 mL via OROMUCOSAL

## 2021-11-03 MED ORDER — PROMETHAZINE HCL 25 MG/ML IJ SOLN: 6.2500 mg | INTRAMUSCULAR | Status: DC | PRN

## 2021-11-03 MED ORDER — OXYCODONE HCL 5 MG PO TABS: 5.0000 mg | ORAL_TABLET | Freq: Four times a day (QID) | ORAL | 0 refills | Status: DC | PRN

## 2021-11-03 MED ORDER — STERILE WATER FOR INJECTION IJ SOLN
INTRAMUSCULAR | Status: AC
  Filled 2021-11-03: qty 10

## 2021-11-03 MED ORDER — METRONIDAZOLE 500 MG PO TABS: 1000.0000 mg | ORAL_TABLET | ORAL | Status: DC

## 2021-11-03 MED ORDER — HYDRALAZINE HCL 20 MG/ML IJ SOLN
INTRAMUSCULAR | Status: AC
  Filled 2021-11-03: qty 1

## 2021-11-03 MED ORDER — 0.9 % SODIUM CHLORIDE (POUR BTL) OPTIME
TOPICAL | Status: DC | PRN
  Administered 2021-11-03: 10:00:00 1000 mL
  Administered 2021-11-03: 09:00:00 2000 mL
  Administered 2021-11-03: 08:00:00 1000 mL

## 2021-11-03 MED ORDER — PHENYLEPHRINE 40 MCG/ML (10ML) SYRINGE FOR IV PUSH (FOR BLOOD PRESSURE SUPPORT)
PREFILLED_SYRINGE | INTRAVENOUS | Status: DC | PRN
  Administered 2021-11-03: 120 ug via INTRAVENOUS
  Administered 2021-11-03: 40 ug via INTRAVENOUS
  Administered 2021-11-03: 120 ug via INTRAVENOUS

## 2021-11-03 MED ORDER — FENTANYL CITRATE (PF) 250 MCG/5ML IJ SOLN
INTRAMUSCULAR | Status: AC
  Filled 2021-11-03: qty 5

## 2021-11-03 MED ORDER — ACETAMINOPHEN 160 MG/5ML PO SOLN
1000.0000 mg | Freq: Four times a day (QID) | ORAL | Status: DC
  Administered 2021-11-03 – 2021-11-07 (×12): 1000 mg via ORAL
  Filled 2021-11-03 (×13): qty 40.6

## 2021-11-03 MED ORDER — CHLORHEXIDINE GLUCONATE CLOTH 2 % EX PADS: 6.0000 | MEDICATED_PAD | Freq: Once | CUTANEOUS | Status: DC

## 2021-11-03 MED ORDER — OXYCODONE HCL 5 MG PO TABS: 5.0000 mg | ORAL_TABLET | Freq: Once | ORAL | Status: DC | PRN

## 2021-11-03 MED ORDER — AMISULPRIDE (ANTIEMETIC) 5 MG/2ML IV SOLN
10.0000 mg | Freq: Once | INTRAVENOUS | Status: DC | PRN
Start: 2021-11-03 — End: 2021-11-03

## 2021-11-03 MED ORDER — DIPHENHYDRAMINE HCL 50 MG/ML IJ SOLN: 12.5000 mg | Freq: Four times a day (QID) | INTRAMUSCULAR | Status: DC | PRN

## 2021-11-03 MED ORDER — BUPIVACAINE-EPINEPHRINE 0.25% -1:200000 IJ SOLN
INTRAMUSCULAR | Status: DC | PRN
  Administered 2021-11-03: 30 mL

## 2021-11-03 MED ORDER — FENTANYL CITRATE (PF) 100 MCG/2ML IJ SOLN
INTRAMUSCULAR | Status: AC
  Filled 2021-11-03: qty 2

## 2021-11-03 MED ORDER — SIMETHICONE 80 MG PO CHEW: 40.0000 mg | CHEWABLE_TABLET | Freq: Four times a day (QID) | ORAL | Status: DC | PRN

## 2021-11-03 MED ORDER — ENSURE PRE-SURGERY PO LIQD
592.0000 mL | Freq: Once | ORAL | Status: DC
  Filled 2021-11-03: qty 592

## 2021-11-03 MED ORDER — PROPOFOL 10 MG/ML IV BOLUS
INTRAVENOUS | Status: DC | PRN
  Administered 2021-11-03: 150 mg via INTRAVENOUS

## 2021-11-03 MED ORDER — DEXAMETHASONE SODIUM PHOSPHATE 4 MG/ML IJ SOLN
INTRAMUSCULAR | Status: DC | PRN
  Administered 2021-11-03: 10 mg via INTRAVENOUS

## 2021-11-03 MED ORDER — HYDROMORPHONE HCL 2 MG/ML IJ SOLN
INTRAMUSCULAR | Status: AC
  Filled 2021-11-03: qty 1

## 2021-11-03 MED ORDER — ENOXAPARIN SODIUM 40 MG/0.4ML IJ SOSY
40.0000 mg | PREFILLED_SYRINGE | Freq: Once | INTRAMUSCULAR | Status: AC
  Administered 2021-11-03: 06:00:00 40 mg via SUBCUTANEOUS
  Filled 2021-11-03: qty 0.4

## 2021-11-03 MED ORDER — PHENYLEPHRINE 40 MCG/ML (10ML) SYRINGE FOR IV PUSH (FOR BLOOD PRESSURE SUPPORT)
PREFILLED_SYRINGE | INTRAVENOUS | Status: AC
  Filled 2021-11-03: qty 10

## 2021-11-03 MED ORDER — SPIRONOLACTONE 100 MG PO TABS
100.0000 mg | ORAL_TABLET | Freq: Every day | ORAL | Status: DC
  Administered 2021-11-04 – 2021-11-07 (×4): 100 mg via ORAL
  Filled 2021-11-03 (×4): qty 1

## 2021-11-03 MED ORDER — ACETAMINOPHEN 10 MG/ML IV SOLN
INTRAVENOUS | Status: AC
  Filled 2021-11-03: qty 100

## 2021-11-03 MED ORDER — LACTATED RINGERS IV SOLN: INTRAVENOUS | Status: DC | PRN

## 2021-11-03 MED ORDER — BUPIVACAINE-EPINEPHRINE (PF) 0.25% -1:200000 IJ SOLN
INTRAMUSCULAR | Status: AC
  Filled 2021-11-03: qty 60

## 2021-11-03 MED ORDER — SODIUM CHLORIDE 0.9 % IV SOLN
2.0000 g | Freq: Two times a day (BID) | INTRAVENOUS | Status: AC
  Administered 2021-11-03: 22:00:00 2 g via INTRAVENOUS
  Filled 2021-11-03: qty 2

## 2021-11-03 MED ORDER — LIP MEDEX EX OINT
1.0000 "application " | TOPICAL_OINTMENT | Freq: Two times a day (BID) | CUTANEOUS | Status: DC
  Administered 2021-11-03 – 2021-11-07 (×8): 1 via TOPICAL
  Filled 2021-11-03 (×2): qty 7

## 2021-11-03 MED ORDER — ENSURE PRE-SURGERY PO LIQD
296.0000 mL | Freq: Once | ORAL | Status: DC
  Filled 2021-11-03: qty 296

## 2021-11-03 MED ORDER — ACETAMINOPHEN 500 MG PO TABS
1000.0000 mg | ORAL_TABLET | Freq: Four times a day (QID) | ORAL | Status: DC
  Filled 2021-11-03: qty 2

## 2021-11-03 MED ORDER — TRAMADOL HCL 50 MG PO TABS: 50.0000 mg | ORAL_TABLET | Freq: Four times a day (QID) | ORAL | Status: DC | PRN

## 2021-11-03 MED ORDER — HYDROMORPHONE HCL 1 MG/ML IJ SOLN
0.5000 mg | INTRAMUSCULAR | Status: DC | PRN
  Administered 2021-11-04 – 2021-11-06 (×5): 1 mg via INTRAVENOUS
  Filled 2021-11-03 (×5): qty 1
  Filled 2021-11-03: qty 2

## 2021-11-03 MED ORDER — KETAMINE HCL 10 MG/ML IJ SOLN
INTRAMUSCULAR | Status: DC | PRN
  Administered 2021-11-03: 10 mg via INTRAVENOUS
  Administered 2021-11-03: 30 mg via INTRAVENOUS

## 2021-11-03 MED ORDER — BUPIVACAINE LIPOSOME 1.3 % IJ SUSP: 20.0000 mL | Freq: Once | INTRAMUSCULAR | Status: DC

## 2021-11-03 MED ORDER — CALCIUM POLYCARBOPHIL 625 MG PO TABS
625.0000 mg | ORAL_TABLET | Freq: Two times a day (BID) | ORAL | Status: DC
  Administered 2021-11-03 – 2021-11-07 (×8): 625 mg via ORAL
  Filled 2021-11-03 (×9): qty 1

## 2021-11-03 SURGICAL SUPPLY — 175 items
ADAPTER GOLDBERG URETERAL (ADAPTER) IMPLANT
ADH SKN CLS APL DERMABOND .7 (GAUZE/BANDAGES/DRESSINGS)
ADPR CATH 15X14FR FL DRN BG (ADAPTER)
APL PRP STRL LF DISP 70% ISPRP (MISCELLANEOUS) ×1
APPLIER CLIP 11 MED OPEN (CLIP) ×2
APPLIER CLIP 5 13 M/L LIGAMAX5 (MISCELLANEOUS)
APPLIER CLIP 9.375 MED OPEN (MISCELLANEOUS) ×2
APPLIER CLIP ROT 10 11.4 M/L (STAPLE)
APR CLP MED 11 20 MLT OPN (CLIP) ×1
APR CLP MED 9.3 20 MLT OPN (MISCELLANEOUS) ×1
APR CLP MED LRG 11.4X10 (STAPLE)
APR CLP MED LRG 5 ANG JAW (MISCELLANEOUS)
BAG COUNTER SPONGE SURGICOUNT (BAG) ×2 IMPLANT
BAG DECANTER FOR FLEXI CONT (MISCELLANEOUS) ×2 IMPLANT
BAG SPNG CNTER NS LX DISP (BAG) ×1
BAG URO CATCHER STRL LF (MISCELLANEOUS) ×2 IMPLANT
BINDER ABDOMINAL 12 ML 46-62 (SOFTGOODS) ×2 IMPLANT
BLADE CLIPPER SURG (BLADE) IMPLANT
BLADE EXTENDED COATED 6.5IN (ELECTRODE) IMPLANT
BLADE HEX COATED 2.75 (ELECTRODE) ×2 IMPLANT
BLADE SURG SZ10 CARB STEEL (BLADE) ×4 IMPLANT
CANISTER SUCT 3000ML PPV (MISCELLANEOUS) ×2 IMPLANT
CANNULA REDUC XI 12-8 STAPL (CANNULA) ×2
CANNULA REDUCER 12-8 DVNC XI (CANNULA) ×1 IMPLANT
CATH URETL OPEN 5X70 (CATHETERS) IMPLANT
CELLS DAT CNTRL 66122 CELL SVR (MISCELLANEOUS) ×1 IMPLANT
CHLORAPREP W/TINT 26 (MISCELLANEOUS) ×3 IMPLANT
CLIP APPLIE 11 MED OPEN (CLIP) ×1 IMPLANT
CLIP APPLIE 5 13 M/L LIGAMAX5 (MISCELLANEOUS) IMPLANT
CLIP APPLIE 9.375 MED OPEN (MISCELLANEOUS) ×1 IMPLANT
CLIP APPLIE ROT 10 11.4 M/L (STAPLE) IMPLANT
CLIP LIGATING HEMO O LOK GREEN (MISCELLANEOUS) IMPLANT
CLOTH BEACON ORANGE TIMEOUT ST (SAFETY) ×2 IMPLANT
CORD BIPOLAR FORCEPS 12FT (ELECTRODE) IMPLANT
COVER SURGICAL LIGHT HANDLE (MISCELLANEOUS) ×4 IMPLANT
COVER TIP SHEARS 8 DVNC (MISCELLANEOUS) ×1 IMPLANT
COVER TIP SHEARS 8MM DA VINCI (MISCELLANEOUS) ×2
DECANTER SPIKE VIAL GLASS SM (MISCELLANEOUS) ×2 IMPLANT
DERMABOND ADVANCED (GAUZE/BANDAGES/DRESSINGS)
DERMABOND ADVANCED .7 DNX12 (GAUZE/BANDAGES/DRESSINGS) IMPLANT
DEVICE TROCAR PUNCTURE CLOSURE (ENDOMECHANICALS) IMPLANT
DRAIN CHANNEL 15F RND FF 3/16 (WOUND CARE) ×7 IMPLANT
DRAIN CHANNEL 19F RND (DRAIN) ×1 IMPLANT
DRAPE ARM DVNC X/XI (DISPOSABLE) ×4 IMPLANT
DRAPE COLUMN DVNC XI (DISPOSABLE) ×1 IMPLANT
DRAPE DA VINCI XI ARM (DISPOSABLE) ×8
DRAPE DA VINCI XI COLUMN (DISPOSABLE) ×2
DRAPE ORTHO SPLIT 77X108 STRL (DRAPES) ×4
DRAPE SHEET LG 3/4 BI-LAMINATE (DRAPES) ×4 IMPLANT
DRAPE SURG IRRIG POUCH 19X23 (DRAPES) ×2 IMPLANT
DRAPE SURG ORHT 6 SPLT 77X108 (DRAPES) ×2 IMPLANT
DRAPE WARM FLUID 44X44 (DRAPES) ×2 IMPLANT
DRSG OPSITE POSTOP 4X10 (GAUZE/BANDAGES/DRESSINGS) IMPLANT
DRSG OPSITE POSTOP 4X6 (GAUZE/BANDAGES/DRESSINGS) IMPLANT
DRSG OPSITE POSTOP 4X8 (GAUZE/BANDAGES/DRESSINGS) ×1 IMPLANT
DRSG PAD ABDOMINAL 8X10 ST (GAUZE/BANDAGES/DRESSINGS) ×6 IMPLANT
DRSG TEGADERM 2-3/8X2-3/4 SM (GAUZE/BANDAGES/DRESSINGS) ×6 IMPLANT
DRSG TEGADERM 4X4.75 (GAUZE/BANDAGES/DRESSINGS) ×2 IMPLANT
ELECT COATED BLADE 2.86 ST (ELECTRODE) ×2 IMPLANT
ELECT PENCIL ROCKER SW 15FT (MISCELLANEOUS) ×1 IMPLANT
ELECT REM PT RETURN 15FT ADLT (MISCELLANEOUS) ×4 IMPLANT
ENDOLOOP SUT PDS II  0 18 (SUTURE)
ENDOLOOP SUT PDS II 0 18 (SUTURE) IMPLANT
EVACUATOR DRAINAGE 10X20 100CC (DRAIN) ×1 IMPLANT
EVACUATOR SILICONE 100CC (DRAIN) ×6 IMPLANT
GAUZE 4X4 16PLY ~~LOC~~+RFID DBL (SPONGE) ×1 IMPLANT
GAUZE SPONGE 2X2 8PLY STRL LF (GAUZE/BANDAGES/DRESSINGS) ×1 IMPLANT
GAUZE SPONGE 4X4 12PLY STRL (GAUZE/BANDAGES/DRESSINGS) ×4 IMPLANT
GLOVE SURG ENC MOIS LTX SZ6 (GLOVE) ×2 IMPLANT
GLOVE SURG ENC TEXT LTX SZ7.5 (GLOVE) ×2 IMPLANT
GLOVE SURG NEOPR MICRO LF SZ8 (GLOVE) ×8 IMPLANT
GLOVE SURG UNDER LTX SZ8 (GLOVE) ×8 IMPLANT
GOWN STRL REUS W/ TWL LRG LVL3 (GOWN DISPOSABLE) ×2 IMPLANT
GOWN STRL REUS W/TWL LRG LVL3 (GOWN DISPOSABLE) ×6 IMPLANT
GOWN STRL REUS W/TWL XL LVL3 (GOWN DISPOSABLE) ×10 IMPLANT
GRASPER ENDOPATH ANVIL 10MM (MISCELLANEOUS) IMPLANT
GRASPER SUT TROCAR 14GX15 (MISCELLANEOUS) ×1 IMPLANT
GUIDEWIRE ANG ZIPWIRE 038X150 (WIRE) IMPLANT
GUIDEWIRE STR DUAL SENSOR (WIRE) IMPLANT
HOLDER FOLEY CATH W/STRAP (MISCELLANEOUS) ×2 IMPLANT
IRRIG SUCT STRYKERFLOW 2 WTIP (MISCELLANEOUS) ×2
IRRIGATION SUCT STRKRFLW 2 WTP (MISCELLANEOUS) ×1 IMPLANT
KIT BASIN OR (CUSTOM PROCEDURE TRAY) ×2 IMPLANT
KIT PROCEDURE DA VINCI SI (MISCELLANEOUS) ×2
KIT PROCEDURE DVNC SI (MISCELLANEOUS) ×1 IMPLANT
KIT TURNOVER KIT A (KITS) ×1 IMPLANT
MANIFOLD NEPTUNE II (INSTRUMENTS) ×2 IMPLANT
NDL INSUFFLATION 14GA 120MM (NEEDLE) ×1 IMPLANT
NEEDLE INSUFFLATION 14GA 120MM (NEEDLE) ×2 IMPLANT
NS IRRIG 1000ML POUR BTL (IV SOLUTION) ×2 IMPLANT
PACK CARDIOVASCULAR III (CUSTOM PROCEDURE TRAY) ×2 IMPLANT
PACK COLON (CUSTOM PROCEDURE TRAY) ×2 IMPLANT
PACK CYSTO (CUSTOM PROCEDURE TRAY) ×2 IMPLANT
PACK GENERAL/GYN (CUSTOM PROCEDURE TRAY) ×3 IMPLANT
PAD POSITIONING PINK XL (MISCELLANEOUS) ×2 IMPLANT
RELOAD PROXIMATE 75MM BLUE (ENDOMECHANICALS) ×2 IMPLANT
RELOAD STAPLE 45 3.5 BLU DVNC (STAPLE) IMPLANT
RELOAD STAPLE 45 4.3 GRN DVNC (STAPLE) IMPLANT
RELOAD STAPLE 60 4.3 GRN DVNC (STAPLE) IMPLANT
RELOAD STAPLE 75 3.8 BLU REG (ENDOMECHANICALS) ×1 IMPLANT
RELOAD STAPLER 3.5X45 BLU DVNC (STAPLE) IMPLANT
RELOAD STAPLER 4.3X45 GRN DVNC (STAPLE) IMPLANT
RELOAD STAPLER 4.3X60 GRN DVNC (STAPLE) ×1 IMPLANT
RETRACTOR LONE STAR DISPOSABLE (INSTRUMENTS) ×2 IMPLANT
RETRACTOR STAY HOOK 5MM (MISCELLANEOUS) ×1 IMPLANT
RETRACTOR WND ALEXIS 18 MED (MISCELLANEOUS) ×1 IMPLANT
RTRCTR WOUND ALEXIS 18CM MED (MISCELLANEOUS) ×2
SCISSORS LAP 5X35 DISP (ENDOMECHANICALS) ×2 IMPLANT
SEAL CANN UNIV 5-8 DVNC XI (MISCELLANEOUS) ×3 IMPLANT
SEAL XI 5MM-8MM UNIVERSAL (MISCELLANEOUS) ×8
SEALER VESSEL DA VINCI XI (MISCELLANEOUS) ×2
SEALER VESSEL EXT DVNC XI (MISCELLANEOUS) ×1 IMPLANT
SLEEVE ADV FIXATION 5X100MM (TROCAR) IMPLANT
SOLUTION ELECTROLUBE (MISCELLANEOUS) ×2 IMPLANT
SPONGE GAUZE 2X2 STER 10/PKG (GAUZE/BANDAGES/DRESSINGS) ×1
SPONGE T-LAP 18X18 ~~LOC~~+RFID (SPONGE) ×3 IMPLANT
STAPLER 60 DA VINCI SURE FORM (STAPLE) ×2
STAPLER 60 SUREFORM DVNC (STAPLE) IMPLANT
STAPLER CANNULA SEAL DVNC XI (STAPLE) ×1 IMPLANT
STAPLER CANNULA SEAL XI (STAPLE) ×2
STAPLER PROXIMATE 75MM BLUE (STAPLE) ×2 IMPLANT
STAPLER RELOAD 3.5X45 BLU DVNC (STAPLE)
STAPLER RELOAD 3.5X45 BLUE (STAPLE)
STAPLER RELOAD 4.3X45 GREEN (STAPLE)
STAPLER RELOAD 4.3X45 GRN DVNC (STAPLE)
STAPLER RELOAD 4.3X60 GREEN (STAPLE) ×2
STAPLER RELOAD 4.3X60 GRN DVNC (STAPLE) ×1
STAPLER VISISTAT 35W (STAPLE) ×2 IMPLANT
STOPCOCK 4 WAY LG BORE MALE ST (IV SETS) ×4 IMPLANT
SURGILUBE 2OZ TUBE FLIPTOP (MISCELLANEOUS) ×2 IMPLANT
SUT MNCRL AB 4-0 PS2 18 (SUTURE) ×2 IMPLANT
SUT MON AB 5-0 PS2 18 (SUTURE) IMPLANT
SUT PDS AB 0 CT 36 (SUTURE) IMPLANT
SUT PDS AB 1 CTX 36 (SUTURE) ×4 IMPLANT
SUT PDS AB 1 TP1 96 (SUTURE) ×2 IMPLANT
SUT PDS AB 2-0 CT2 27 (SUTURE) ×3 IMPLANT
SUT PDS AB 3-0 SH 27 (SUTURE) ×4 IMPLANT
SUT PLAIN GUT FAST 5-0 (SUTURE) IMPLANT
SUT PROLENE 0 CT 2 (SUTURE) ×2 IMPLANT
SUT PROLENE 2 0 KS (SUTURE) ×2 IMPLANT
SUT PROLENE 2 0 SH DA (SUTURE) ×2 IMPLANT
SUT SILK 2 0 (SUTURE) ×2
SUT SILK 2 0 SH CR/8 (SUTURE) ×2 IMPLANT
SUT SILK 2-0 18XBRD TIE 12 (SUTURE) ×1 IMPLANT
SUT SILK 3 0 (SUTURE) ×2
SUT SILK 3 0 SH CR/8 (SUTURE) ×2 IMPLANT
SUT SILK 3-0 18XBRD TIE 12 (SUTURE) ×1 IMPLANT
SUT V-LOC BARB 180 2/0GR6 GS22 (SUTURE)
SUT VIC AB 2-0 SH 27 (SUTURE) ×2
SUT VIC AB 2-0 SH 27X BRD (SUTURE) IMPLANT
SUT VIC AB 3-0 PS2 18 (SUTURE) ×2 IMPLANT
SUT VIC AB 3-0 PS2 18XBRD (SUTURE) IMPLANT
SUT VIC AB 3-0 SH 18 (SUTURE) IMPLANT
SUT VIC AB 3-0 SH 27 (SUTURE)
SUT VIC AB 3-0 SH 27X BRD (SUTURE) IMPLANT
SUT VIC AB 3-0 SH 27XBRD (SUTURE) IMPLANT
SUT VIC AB 4-0 PS2 27 (SUTURE) ×2 IMPLANT
SUT VICRYL 0 UR6 27IN ABS (SUTURE) ×2 IMPLANT
SUT VLOC 180 0 24IN GS25 (SUTURE) IMPLANT
SUTURE V-LC BRB 180 2/0GR6GS22 (SUTURE) IMPLANT
SYR 10ML LL (SYRINGE) ×2 IMPLANT
SYR BULB IRRIG 60ML STRL (SYRINGE) ×2 IMPLANT
SYS LAPSCP GELPORT 120MM (MISCELLANEOUS)
SYS WOUND ALEXIS 18CM MED (MISCELLANEOUS)
SYSTEM LAPSCP GELPORT 120MM (MISCELLANEOUS) IMPLANT
SYSTEM WOUND ALEXIS 18CM MED (MISCELLANEOUS) IMPLANT
TAPE UMBILICAL 1/8 X36 TWILL (MISCELLANEOUS) ×1 IMPLANT
TOWEL OR 17X26 10 PK STRL BLUE (TOWEL DISPOSABLE) ×4 IMPLANT
TOWEL OR NON WOVEN STRL DISP B (DISPOSABLE) ×4 IMPLANT
TRAY FOLEY MTR SLVR 14FR STAT (SET/KITS/TRAYS/PACK) ×2 IMPLANT
TRAY FOLEY MTR SLVR 16FR STAT (SET/KITS/TRAYS/PACK) ×2 IMPLANT
TROCAR ADV FIXATION 5X100MM (TROCAR) ×2 IMPLANT
TUBING CONNECTING 10 (TUBING) ×4 IMPLANT
TUBING INSUFFLATION 10FT LAP (TUBING) ×2 IMPLANT
TUBING UROLOGY SET (TUBING) IMPLANT

## 2021-11-03 NOTE — Interval H&P Note (Signed)
History and Physical Interval Note:  11/03/2021 6:45 AM  Beth [name]  has presented today for surgery, with the diagnosis of RECURRENT ANAL CANCER.  The various methods of treatment have been discussed with the patient and family. After consideration of risks, benefits and other options for treatment, the patient has consented to  Procedure(s): XI ROBOT ABDOMINAL PERINEAL RESECTION (N/A) POSSIBLE OSTOMY (N/A) RIGID PROCTOSCOPY (N/A) CYSTOSCOPY with FIREFLY INJECTION (N/A) VERTICAL RECTUS ABDOMINIS MYOCUTANEOUS TO PERINEUM (N/A) as a surgical intervention.  The patient's history has been reviewed, patient examined, no change in status, stable for surgery.  I have reviewed the patient's chart and labs.  Questions were answered to the patient's satisfaction.     Arnoldo Hooker Aeva Posey

## 2021-11-03 NOTE — Op Note (Signed)
11/03/2021  10:52 AM  PATIENT:  Beth [name]  50 y.o. female  Patient Care Team: Caryl Bis, MD as PCP - General (Unknown Physician Specialty) Michael Boston, MD as Consulting Physician (General Surgery) Wonda Horner, MD as Consulting Physician (Gastroenterology) [name], MD as Referring Physician (Obstetrics and Gynecology) Virgel Manifold, MD as Consulting Physician (Gastroenterology) Ladell Pier, MD as Consulting Physician (Oncology)  PRE-OPERATIVE DIAGNOSIS:  RECURRENT ANAL CANCER  POST-OPERATIVE DIAGNOSIS:  RECURRENT ANAL SQUAMOUS CELL CANCER  PROCEDURE:   XI ROBOT ABDOMINOPERINEAL RESECTION END COLOSTOMY  SURGEON:  [name], MD  ASSISTANT: Nadeen Landau, MD An experienced assistant was required given the standard of surgical care given the complexity of the case.  This assistant was needed for exposure, dissection, suction, tissue approximation, retraction, perception, etc.  ANESTHESIA:   general  Nerve block provided with liposomal bupivacaine (Experel) mixed with 0.25% bupivacaine as a Bilateral TAP block x 65mL each side at the level of the transverse abdominis & preperitoneal spaces along the flank at the anterior axillary line, from subcostal ridge to iliac crest under laparoscopic guidance    EBL:  Total I/O In: 2600 [I.V.:2000; IV Piggyback:600] Out: 400 [Urine:100; Blood:300].  See anesthesia record  Delay start of Pharmacological VTE agent (>24hrs) due to surgical blood loss or risk of bleeding:  no  DRAINS:   27 [place] Blake drain goes down into the pelvis and exits at the right lateral abdomen  15 French drain goes into the right paramedian subfascial tissues of the vertical anterior abdominal wall VRAM donor site and exits at the right mid abdomen  SPECIMEN:   RECTOSIGMOID WITH ANUS RIGHT LATERAL LEVATOR ANI PELVIC MARGIN  DISPOSITION OF SPECIMEN:  PATHOLOGY  COUNTS:  YES  PLAN OF CARE: Admit to inpatient    PATIENT DISPOSITION:  PACU - hemodynamically stable.   INDICATION:    Pleasant patient with history of abnormal anal masses and found to have anal intraepithelial neoplasia.  Several years later developed hard nodular area and recurrence.  Biopsy consistent with squamous cell cancer.  Underwent  chemoradiation modified Nigro protocol.  Cancer regression and 3 months but concern for recurrence on follow up anal rectal examination.  Operative anorectal examination under anesthesia with biopsy showing evidence of recurrent squamous cell carcinoma.  Recommendation made for abdominoperineal resection.  Given the need for significant abdominoperineal resection and pelvic floor musculature resection, I recommended muscle flap wound closure of the pelvic floor by plastic reconstructive surgery.  Dr. Iran Planas agreed and recommended vertical rectus abdominis muscular (VRAM) flap.  I recommended segmental resection:  The anatomy & physiology of the digestive tract was discussed.  The pathophysiology was discussed.  Natural history risks without surgery was discussed.   I worked to give an overview of the disease and the frequent need to have multispecialty involvement.  I feel the risks of no intervention will lead to serious problems that outweigh the operative risks; therefore, I recommended a partial colectomy to remove the pathology.  Laparoscopic & open techniques were discussed.    Risks such as bleeding, infection, abscess, leak, reoperation, possible ostomy, hernia, heart attack, death, and other risks were discussed.  I noted a good likelihood this will help address the problem.   Goals of post-operative recovery were discussed as well.  We will work to minimize complications.  Educational materials on the pathology had been given in the office.  Questions were answered.    The patient expressed understanding & wished to proceed  with surgery.  OR FINDINGS:   Dense fibrotic tumor involving the anal  canal and distal rectum posterior 40% of the circumference.  Closest margin right lateral pelvic floor/levator.  Remaining right lateral pelvic floor margin negative on frozen.  End descending colostomy left lower quadrant.  Right vertical rectus abdominis donor site to perineum per Dr. Iran Planas.  See her separate OR note  Drains as noted above  CASE DATA:  Type of patient?: Elective [facility] Private Case  Status of Case? Elective Scheduled  Infection Present At Time Of Surgery (PATOS)?  NO  PROCEDURE: nformed consent was confirmed.  The right abdominal donor VRAM flap surface anatomy was marked by the right upper paramedian supraumbilical region by Dr. Iran Planas with plastic reconstructive surgery.  The patient underwent general anaesthesia without difficulty.  The patient was positioned appropriately.  VTE prevention in place.  The patient's abdomen & perineum was clipped, prepped, & draped in a sterile fashion.  Dr. Louis Meckel was able to do cystoscopy with firefly injection of ureters and bladder for anatomical marking.  Patient reprepped and draped.  Surgical timeout confirmed our plan.  The patient was positioned in reverse Trendelenburg.  Abdominal entry was gained using Varess technique on the anterior abdominal wall fascia at the subcostal ridge in the left upper abdomen.  Entry was clean.  I induced carbon dioxide insufflation.  Camera inspection revealed no injury.  Extra robotic noncutting ports were carefully placed under direct laparoscopic visualization, taking care to stay away from the right sided VRAM flap donor site and inferior epigastric pedicle.  I reflected the greater omentum and the upper abdomen the small bowel in the upper abdomen.  The patient was carefully positioned.  The Intuitive daVinci robot was carefully docked with camera & instruments carefully placed.  Began robotic exploration.  Thickening of the rectosigmoid and some twisting.  I mobilized the rectosigmoid colon off  the left lower quadrant and anterior/left lateral pelvis and lateral medial fashion to help straighten out.  With that I elevate the rectosigmoid mesentery.  It was fibrotic, consistent with prior pelvic radiation.  I scored the base of peritoneum of the medial side of the mesentery of the left colon from the ligament of Treitz to the peritoneal reflection of the mid rectum.   I elevated the sigmoid mesentery and entered into the retro-mesenteric plane.   Able to see and identify the ureters and keep them in their native retroperitoneal positioning.  Some edema and fibrosis consistent with prior iliac radiation.  Mobilized it off the left ureter and gonadal vessels and adnexa safely.  We took extra care to skeletonized and identified the superior hemorrhoidal as well as left colic pedicles.  Mobilized the sigmoid colon off the retroperitoneum.  Chose a region in the descending colon that would reach to the left lower quadrant anterior abdominal wall at the premarked colostomy site.  There is a come down and skeletonized the IMA near its takeoff of the aorta [place] the lymph nodes around it.  Found the branching point at the superior hemorrhoidal off the IMA pedicle and preserved a good left colic branch going to the mid/distal descending junction as the site for proximal resection.    We then focused on pelvic dissection.  Elevated the rectosigmoid mesentery had proximal mesorectum anteriorly.  Was quite thickened & fibrotic.  Was able to free the mesorectum off the sacrum in the presacral plane staying close to its to have good margins.  They will eventually come down to the posterior  floor.  The patient had a sharp anterior curvature of the pelvis.  Primarily worked posteriorly, then laterally, then anteriorly.  Freddrick March the very low anterior peritoneal reflection off the narrow radiated pelvis.  I had to switch over to hook cautery since it was very fibrotic in hard.  However was able to come through en bloc the  post pelvic floor levator ani musculature.  But was able to came through the posterior pelvic floor musculature.  Patient had a sharp anterior curvature to his coccyx.  Eventually freed the mesorectum off that.  Came around right lateral and left lateral into ischiorectal fat.  Came around the lateral pedicles carefully.  Came around anteriorly.  Fortunately the plane between the anterior rectum and rectovaginal septum was merely edematous and not involved with tumor.  Was able to free that down more safely.  I did do digital rectal exam confirmed that I had good distal dissection.  Right lateral anal rectal region is where it had a prior Vicryl suture when I done my biopsy last month.     We placed a robotic stapler port through the colostomy site premarked in the left infraumbilical paramedian region.  Completed transection of the mesentery and skeletonized around it at the mid/distal descending junction.  Transected across this with a single robotic green load 60 mm staple fire.  We inspected and sure hemostasis was good.  Clamp held on the descending colon staple line to bring up as an end colostomy.  We undocked the robot and focused on perineal resection.  Used retractors.  Made a vertical Bio-[facility] in [facility] like incision between the perineum to the lateral perirectal region to the distal intergluteal cleft.  Came through the dermis and ischial rectal fat.  I was able to come more proximally and connected with my dissection of the posterior pelvic floor off the coccyx.  Got into the pelvis.  Freed off the remaining ischial rectal fat and pelvic floor muscle posterior laterally on both sides and then anterior laterally.  I was able to eviscerate the colon that had been stapled off.  Then carefully freed off the specimen off the rectovaginal septum circumferentially and removed it.  Specimen sent off.  The closest margin had been right posteriolateral so I did send off extra levator muscle and sent that  for frozen.  Dr. Claudette Laws called and confirmed fibrosis and no tumor.  Reassuring.  We created a 25 mm cylindrical opening at the prior staple port in the left infraumbilical paramedian region at the premarked colostomy site.  Removed, wound of subcutaneous fat.  Open the anterior rectus fascia transversely.  Came through the rectus muscle and posterior rectus fascia vertically.  Brought up the end colostomy for good orientation.  Mesentery inferiorly at 6:00.  At this point Dr. Iran Planas came in and created the VRAM flap from the right rectus muscle donor site.  See her separate operative note.  I scrubbed back in when she had created a VRAM pedicle flap and confirmed good viability.  I created an opening in the posterior fascia and the right suprapubic region obliquely.  I passed the flap down into the true pelvis to Dr. Iran Planas.  She confirmed orientation and viability.  We confirmed that the 42 French pelvic drain was in good placement.  I brought the greater omentum down and use that to help fill up the pelvis to help keep some of the small bowel out.  She had a short greater omentum but her transverse  colon reached down well.  While Dr. Iran Planas sutured the VRAM flap in place, I closed the right paramedian wound.  I closed the posterior rectus fascia that I had created using 0 PDS in a running fashion.  We had placed a 15 Fr Blake drain in this subfascial plane where her rectus used to be.   I then closed the anterior rectus fascia vertically using #1 PDS in a running fashion.  Some mild tension but came together relatively well.   Trimmed off some excess skin and mobilize some subcutaneous flaps.  Closed the skin with 3-0 Vicryl interrupted deep dermal sutures and running 4 Monocryl suture.  Port sites had been removed.  Drain secured with Prolene sutures and port sites closed with Monocryl.  Both bulbs held suction.  Sterile dressings applied.   I matured the colostomy in a Brooke fashion using  3-0 Vicryl interrupted suture.  Confirmed good intubation and orientation.  Mucosa pink and viable.  1 cm circumferential height.  Colostomy appliance applied.  Patient extubated and is in recovery room patient medically stable.  I discussed operative findings, updated the patient's status, discussed probable steps to recovery, and gave postoperative recommendations to the patient's spouse< [employer].  Recommendations were made.  Questions were answered.  He expressed understanding & appreciation.    [name], MD, FACS, MASCRS Gastrointestinal and Minimally Invasive Surgery    [address], [place], [place] [address] [phone] Main / Paging [phone] Fax

## 2021-11-03 NOTE — H&P (Signed)
11/03/2021    PROVIDER: Hollace Kinnier, MD  Patient Care Team: Caryl Bis, MD as PCP - General (Family Medicine) Johney Maine, Adrian Saran, MD as Consulting Provider (General Surgery) Marye Round, MD (Radiation Oncology) Vonda Antigua, MD (Gastroenterology) Benay Spice Newman Nickels, MD (Hematology and Oncology) Irene Limbo, MD (Plastic Surgery)  [facility] MRN: [id number] DOB: [birth year] DATE OF ENCOUNTER: 10/19/2021  Interval History:   The patient returns to the office after undergoing  Prior excision of AIN III, 07/2016-09/2016 (Lost to follow-up) Anorectal examination under anesthesia with biopsy of anal mass 02/25/2021 Pathology consistent with squamous cell carcinoma anus with some neuroendocrine features  -Modified Nigro chemoradiation therapy protocol for mixed high-grade neuroendocrine squamous cell carcinoma. - completed 04/27/2021  Anorectal examination under anesthesia with biopsy of recurrent anal mass. Biopsy consistent with poorly differentiated cancer.  Patient comes a by herself. She is eating okay. Moving her bowels a couple times a day with some urgency. She has still has some anal pain after the biopsy but it is mostly controlled with Tylenol. Occasionally uses narcotics at night. She is working. She already had her preop visit and marking. Had questions about needing bowel prep and antibiotics.  Oncological history: 1. Squamous cell carcinoma of the anal canal ? Colonoscopy 01/28/2021-rectal mass extending to the "outside ", palpable on exam-biopsy revealed high-grade squamous intraepithelial lesion (AIN 3/carcinoma in situ) ? Exam under anesthesia with transanal excision of an anal canal mass 02/25/2021-frozen section consistent with squamous cell carcinoma, 4 x 4 centimeter sessile anal canal mass extending to the anal verge, right lateral and posterior, 30% circumferential, fixed to the anal sphincter complex; anal rectal mass with mixed high-grade  neuroendocrine squamous cell carcinoma; posterior anal tag with squamous cell carcinoma; left labial lesion-condyloma acuminata ? CTs 03/07/2021-ill-definition of tissue planes along the anus with abnormal soft tissue prominence posteriorly along the anal canal suspicious for mass.  Small adjacent lower perirectal lymph nodes in addition to a 0.5 cm sacral node.  Focal enhancing lesion in the right hepatic lobe 1.2 x 0.8 x 0.9 cm.  2.1 x 1.5 cm cystic lesion right ovary with thin enhancing rim. ? PET scan 03/16/2021-hypermetabolic mass within the anal canal.  No definite evidence of local or distant metastatic disease.  7 mm left submandibular lymph node with mild FDG uptake, likely reactive.  Focal site of FDG uptake within the inner quadrant of the right breast.  03/21/2021 through 04/27/2021 Site Technique Total Dose (Gy) Dose per Fx (Gy) Completed Fx Beam Energies  Anus: Anus IMRT 50.4/50.4 1.8 28/28 6X     Labs, Imaging and Diagnostic Testing:  Located in [place]' section of Epic EMR chart  PRIOR NOTES  [facility] Appointment: 04/25/2021 9:15 AM Location: [facility] Patient #: [id number] DOB: May 25, 1971 Married / Language: [name] / Race: White Female  History of Present Illness [name] MD; 04/25/2021 9:55 AM) The patient is a 50 year old female who presents with anal lesions. Note for "Anal lesions": ` ` ` The patient returns s/p anorectal examination under anesthesia with biopsy of anal mass 02/25/2021  Pathology consistent with squamous cell carcinoma anus with some neuroendocrine features.  Oncological history so far: 1. Squamous cell carcinoma of the anal canal ? Colonoscopy 01/28/2021-rectal mass extending to the "outside ", palpable on exam-biopsy revealed high-grade squamous intraepithelial lesion (AIN 3/carcinoma in situ) ? Exam under anesthesia with transanal excision of an anal canal mass 02/25/2021-frozen section consistent with squamous cell  carcinoma, 4 x 4 centimeter  sessile anal canal mass extending to the anal verge, right lateral and posterior, 30% circumferential, fixed to the anal sphincter complex; anal rectal mass with mixed high-grade neuroendocrine squamous cell carcinoma; posterior anal tag with squamous cell carcinoma; left labial lesion-condyloma acuminata ? CTs 03/07/2021-ill-definition of tissue planes along the anus with abnormal soft tissue prominence posteriorly along the anal canal suspicious for mass.  Small adjacent lower perirectal lymph nodes in addition to a 0.5 cm sacral node.  Focal enhancing lesion in the right hepatic lobe 1.2 x 0.8 x 0.9 cm.  2.1 x 1.5 cm cystic lesion right ovary with thin enhancing rim. ? PET scan 03/16/2021-hypermetabolic mass within the anal canal.  No definite evidence of local or distant metastatic disease.  7 mm left submandibular lymph node with mild FDG uptake, likely reactive.  Focal site of FDG uptake within the inner quadrant of the right breast. ? Radiation 03/21/2021 ? Cycle one 5-FU/mitomycin-C 03/21/2021 ? Cycle two 5-FU/Mitomycin-C 04/18/2021, 5-FU dose reduced secondary to mucositis  The patient returns to clinic after surgery, gradually improving.  PET scan showed a breast mass.  She evaluation for a breast biopsy.  Biopsy showed fibrocystic changes.  She's been started on modified Nigro protocol starting March 21, 2021.  She is due to complete blood count this coming week.  She is very tender and sore doesn't want to be examined.  Denies nausea, constipation/diarrhea, worsening fatigue, high fevers, or other concerns.  She has about 1 bowel movement a day but it is watery.  They discussed about using Imodium.  She was hesitant to do any topical therapy since she recalls being told the cancer center that would affect treatment.  She was concerned about the diagnosis of cancer.  She was lost to follow-up but seemed confused that she thought I had set as needed.  She's tried to think  forward for now.  Getting pain medications through the cancer center. ` ###########################################  Pathology: Diagnosis Breast, right, needle core biopsy, upper, inner - FIBROCYSTIC CHANGES WITH SCLEROSING ADENOSIS. - SMALL FOCUS OF INFLAMMATION AND FIBROSIS SUGGESTIVE OF RESOLVING FAT NECROSIS. - PSEUDOANGIOMATOUS STROMAL HYPERPLASIA (PASH) - NO EVIDENCE OF MALIGNANCY. Microscopic Comment Called to the [place] on 04/12/21. (JDP:kh 04/12/21) Claudette Laws MD Pathologist, Electronic Signature (Case signed 04/12/2021) Specimen Rekita Miotke and Clinical Information Specimen Comment TIF: 8:45 am, CIT: < 2 minutes, subcentimeter enhancing mass, indeterminate, recently diagnosed anal cancer, breast mass seen on PET Specimen(s) Obtained: Breast, right, needle core biopsy, upper, inner Specimen Clinical Information R/o atypia or malignancy Jenelle Drennon Received in formalin labeled with the patient's name (Adrina Lapid) and "Rt UIQ" is a 2.0 x 1.8 x 0.2 cm aggregate of yellow-tan fibrofatty tissue, submitted in toto in a single cassette(s). TIF 0174, CIT <2 minutes. (LF, 04/11/21) Report signed out from the following location(s) Technical Component was performed at [facility]. [address],[place] 1 of 2 FINAL for [place], [place] [phone]) Report signed out from the following location(s)(continued) [phone].[id number]., Interpretation was performed at [facility] [place], [place], [place] [address]. CLIA #: [id number], 2 of 2` ` ###########################################`  02/25/2021  11:59 AM  PATIENT:  Beth [name]  50 y.o. female  Patient Care Team: Caryl Bis, MD as PCP - General (Unknown Physician Specialty) Michael Boston, MD as Consulting Physician (General Surgery) Wonda Horner, MD as Consulting Physician (Gastroenterology) [name], MD as Referring  Physician (Obstetrics and Gynecology) Virgel Manifold, MD as Consulting Physician (Gastroenterology)  PRE-OPERATIVE DIAGNOSIS:  ANAL CANAL MASS WITH AIN III  POST-OPERATIVE DIAGNOSIS:   Squamous Cell Cancer of the Anal Canal Labial skin mass, probable Condyloma  PROCEDURE:   ANORECTAL EXAM UNDER ANESTHESIA TRANSANAL EXCISIONAL  BIOPSY OF ANAL CANAL MASS EXCISION of  LABIAL LESION  SURGEON:  [name], MD  ANESTHESIA:  General Anorectal & Local field block (0.25% bupivacaine with epinephrine mixed with Liposomal bupivacaine (Experel)  EBL:  Total I/O In: 900 [I.V.:900] Out: 10 [Blood:10].  See operative record  Delay start of Pharmacological VTE agent (>24hrs) due to surgical blood loss or risk of bleeding:  NO  DRAINS: NONE  SPECIMEN:    -Posterior anal canal ulcerated mass with partial excision for biopsy -Posterior midline anal tag.  Most likely sentinel tag -Left external lateral labial skin tag.  Acrochordon versus condyloma  DISPOSITION OF SPECIMEN:  PATHOLOGY  COUNTS:  YES  PLAN OF CARE: Discharge home after PACU  PATIENT DISPOSITION:  PACU - hemodynamically stable.  INDICATION: Pleasant patient with history of anal intraepithelial neoplasia status post excision and treatment in 2017.  Lost to follow-up.  And recurrent swelling and concern.  Had colonoscopy which raised concern of recurrent anal canal mass.  Biopsy showing at least anal intraepithelial neoplasia.  Surgical consultation requested.  Patient had discomfort so examination limited in the office.  I recommended anorectal examination under anesthesia with removal and/or biopsy.    The anatomy & physiology of the anorectal region was discussed.  The pathophysiology of anorectal warts and differential diagnosis was discussed.  Natural history risks without surgery was discussed such as further growth and cancer.   I stressed the importance of office follow-up to catch early recurrence &  minimize/halt progression of disease.  Interventions such as cauterization by topical agents were discussed.  The patient's symptoms are not adequately controlled by non-operative treatments.  I feel the risks & problems of no surgery outweigh the operative risks; therefore, I recommended surgery to treat the anal warts by removal, ablation and/or cauterization.  Risks such as bleeding, infection, need for further treatment, Risks of bleeding, infection, injury to other organs, need for repair of tissues / organs, reoperation, heart attack, death, and other risks were discussed.   I noted a good likelihood this will help address the problem. Goals of post-operative recovery were discussed as well.  Possibility that this will not correct all symptoms was explained.  Post-operative pain, bleeding, constipation, and other problems after surgery were discussed.  We will work to minimize complications.   Educational handouts further explaining the pathology, treatment options, and bowel regimen were given as well.  Questions were answered.  The patient expresses understanding & wishes to proceed with surgery.  OR FINDINGS: Firm 4 x 4 cm sessile but fungating anal canal mass continuing to the anal verge.  Primarily right lateral and posterior.  Involving 30% of the circumference.  Fixed to and probably going through the posterior anal sphincter complex.  Grossly suspicious for cancer.  Frozen biopsy done.  Discussed with Dr. Jaquita Folds with pathology.  Squamous cell carcinoma noted by him on frozen evaluation.Marland Kitchen  Possible focus of high-grade differentiated versus neuroendocrine changes.  No evidence of adenocarcinoma.  Small posterior midline sentinel tag.  Excised.  7 cm pedunculated verrucous skin tag on left lateral external labia most likely consistent with condyloma versus atypical acrochordon.  Excised.  No other suspicious lesions.  DESCRIPTION:  Informed consent was confirmed. Patient underwent  general anesthesia without difficulty. Patient was placed into  prone positioning.  The perianal region was prepped and draped in sterile fashion. Surgical time-out confirmed our plan.  I did digital rectal examination and then transitioned over to anoscopy to get a sense of the anatomy.  Findings noted above.  Patient clearly had a firm ulcerative fungating mostly sessile mass involving the anal canal.  Fullness going through the sphincters.  Highly suspicious for anal cancer.  I do scissors to sharply debulk the specimen without involving the sphincters.  Sent this for frozen specimen.  I assured hemostasis with focused cautery and interrupted Vicryl suture.  Patient had persistent posterior midline anal tag.  It seemed smooth like a sentinel tag from a prior chronic fissure.  I excised it.  I reinspected and hemostasis was good after suturing and pressure held for 15 minutes while I waited for frozen specimen evaluation.  And noted a skin mass on the left lateral labia that I excised vertically and closed with interrupted Monocryl suture and Dermabond.  Frozen specimen confirmed squamous cell cancer, unfortunately.  No evidence of adenocarcinoma.  Since this was not resectable and potentially better treated with chemoradiation therapy, I completed the case.  I had discussed postop care in detail with the patient in the preop holding area.  Instructions for post-operative recovery and prescriptions are written. I discussed operative findings, updated the patient's status, discussed probable steps to recovery, and gave postoperative recommendations to the patient's spouse, Chloeanne Poteet, [name].  Recommendations were made.  Questions were answered.  He expressed understanding & appreciation.  [name], M.D., F.A.C.S. Gastrointestinal and Minimally Invasive Surgery [facility] [address] [place],  [address] [phone] Main / Paging  Allergies  Janeann Forehand, CNA; 04/25/2021 8:58 AM) Sulfa Antibiotics   Allergies Reconciled    Medication History Janeann Forehand, CNA; 04/25/2021 8:59 AM) Triamcinolone Acetonide  (0.1% Cream, External) Active. Nystatin  (100000 UNIT/ML Suspension, Mouth/Throat) Active. DILTIAZEM Gel  (2% Gel, 1 (one) application External four times daily, Taken starting 04/23/2017) Active. (Apply on anus for 3-6 weeks to allow fissure to heal) oxyCODONE HCl  (5MG Tablet, 1-2 Oral every six hours, as needed for moderate/severe pain, Taken starting 03/17/2021) Active. MiraLax  (Oral) Active. ProAir HFA  (108 (90 Base)MCG/ACT Aerosol Soln, Inhalation) Active. ValACYclovir HCl  (500MG Tablet, Oral as needed) Active. Multivitamin Adult  (Oral) Active. Medications Reconciled   Physical Exam [name] MD; 04/25/2021 9:54 AM) General Mental Status - Alert. General Appearance - Not in acute distress. Voice - Normal.  Integumentary Global Assessment Upon inspection and palpation of skin surfaces of the - Distribution of scalp and body hair is normal. General Characteristics Overall examination of the patient's skin reveals - no rashes and no suspicious lesions.  Head and Neck Head - normocephalic, atraumatic with no lesions or palpable masses. Face Global Assessment - atraumatic, no absence of expression. Neck Global Assessment - no abnormal movements, no decreased range of motion. Trachea - midline. Thyroid Gland Characteristics - non-tender.  Eye Eyeball - Left - Extraocular movements intact, No Nystagmus - Left. Eyeball - Right - Extraocular movements intact, No Nystagmus - Right. Upper Eyelid - Left - No Cyanotic - Left. Upper Eyelid - Right - No Cyanotic - Right.  Chest and Lung Exam Inspection Accessory muscles - No use of accessory muscles in breathing.  Rectal Note:  Patient against any examination.  Given her complaints, recommended at least external examination which was what I would only  do at this point. She allowed  that. 4 cm radius of pruritus and skin breakdown consider with radiation. Some yellow-white discharge consistent with round wound. Increased sphincter tone. I held off on any internal or other exam. Examination done with female CMA in room  Peripheral Vascular Upper Extremity Inspection - Left - Not Gangrenous, No Petechiae. Inspection - Right - Not Gangrenous, No Petechiae.  Neurologic Neurologic evaluation reveals  - normal attention span and ability to concentrate, able to name objects and repeat phrases. Appropriate fund of knowledge and normal coordination.  Neuropsychiatric Mental status exam performed with findings of - able to articulate well with normal speech/language, rate, volume and coherence and no evidence of hallucinations, delusions, obsessions or homicidal/suicidal ideation. Orientation - oriented X3.  Musculoskeletal Global Assessment Gait and Station - normal gait and station.  Lymphatic General Lymphatics Description - No Generalized lymphadenopathy.  Assessment & Plan [name] MD; 04/25/2021 9:56 AM) SQUAMOUS CELL CARCINOMA OF ANUS (C21.0) Impression: Anal cancer arising in the setting of AIN from prior chronic fissure.  Follow-up was confused in 2018. I recommend 6 month follow-up given her history of AIN but she only saw the hemorrhoid follow-up that was when necessary. I apologized for the confusion that stressed that she should call if there is any doubt her concern to clarify things.  She is nearly done with her chemoradiation therapy. Last radiation this week.  Start surveillance in the survivable pathway in 3 months.  Nonetheless, I strongly recommend she do our survivable pathway for anal cancer. Usually start doing digital anoscopic exam 3 months after completion of chemoradiation therapy. 3 months the first year, then every 6 months until year 5.  I again gave copies of the pathology reports. Current Plans Follow  up with [place] in the office in 3 MONTHS.   Call us sooner as needed.  Pt Education - [facility] Anal Cancer ([place]) Surgical (Rectal & Anoscopic exam) follow-up after Treatement for Anal Cancer:               -every 3 months until negative exam x2, then -every 6 months until 5 years, then         -as needed thereafter  PRURITUS ANI (L29.0) Impression: Significant pruritus most likely due to radiation and chemotherapy fact.  Recommend she do warm soaks. Consider powder or cotton balls to help dry the area. Sometimes zinc barrier creams are helpful. She recovered recall being told by the cancer center to avoid any of this as it might keep the radiation from resolving. I will reach up to radiation oncology to make sure that that is not truly an issue or not.  I also recommend she get on a fiber supplement to thicken up her stools as she notes they're kind of loose and watery. Then add-on Imodium as needed since she does not have diarrhea. Hopefully she gets further out her stools will normalize and scarring will calm down. Current Plans Pt Education - [facility] Pruritus Ani ([place]) Pt Education - [facility] Good Bowel Health ([place]am [place])  Signed electronically by [name], MD (04/25/2021 9:56 AM)  SURGERY NOTES:  02/25/2021   11:59 AM   PATIENT: Beth Juniper Humphrey 51 y.o. female   Patient Care Team: Caryl Bis, MD as PCP - General (Unknown Physician Specialty) Michael Boston, MD as Consulting Physician (General Surgery) Wonda Horner, MD as Consulting Physician (Gastroenterology) [name], MD as Referring Physician (Obstetrics and Gynecology) Virgel Manifold, MD as Consulting Physician (Gastroenterology)   PRE-OPERATIVE DIAGNOSIS: ANAL CANAL MASS WITH  AIN III   POST-OPERATIVE DIAGNOSIS:  Squamous Cell Cancer of the Anal Canal Labial skin mass, probable Condyloma   PROCEDURE:  ANORECTAL EXAM UNDER ANESTHESIA TRANSANAL EXCISIONAL BIOPSY OF ANAL CANAL MASS EXCISION of LABIAL LESION    SURGEON: [name], MD   ANESTHESIA:   General Anorectal & Local field block (0.25% bupivacaine with epinephrine mixed with Liposomal bupivacaine (Experel)   EBL: Total I/O In: 900 [I.V.:900] Out: 10 [Blood:10]. See operative record   Delay start of Pharmacological VTE agent (>24hrs) due to surgical blood loss or risk of bleeding: NO   DRAINS: NONE   SPECIMEN:  -Posterior anal canal ulcerated mass with partial excision for biopsy -Posterior midline anal tag. Most likely sentinel tag -Left external lateral labial skin tag. Acrochordon versus condyloma   DISPOSITION OF SPECIMEN: PATHOLOGY   COUNTS: YES   PLAN OF CARE: Discharge home after PACU   PATIENT DISPOSITION: PACU - hemodynamically stable.   INDICATION: Pleasant patient with history of anal intraepithelial neoplasia status post excision and treatment in 2017. Lost to follow-up. And recurrent swelling and concern. Had colonoscopy which raised concern of recurrent anal canal mass. Biopsy showing at least anal intraepithelial neoplasia. Surgical consultation requested. Patient had discomfort so examination limited in the office. I recommended anorectal examination under anesthesia with removal and/or biopsy.    The anatomy & physiology of the anorectal region was discussed. The pathophysiology of anorectal warts and differential diagnosis was discussed. Natural history risks without surgery was discussed such as further growth and cancer. I stressed the importance of office follow-up to catch early recurrence & minimize/halt progression of disease. Interventions such as cauterization by topical agents were discussed.   The patient's symptoms are not adequately controlled by non-operative treatments. I feel the risks & problems of no surgery outweigh the operative risks; therefore, I recommended surgery to treat the anal warts by removal, ablation and/or cauterization.   Risks such as bleeding, infection, need for further  treatment, Risks of bleeding, infection, injury to other organs, need for repair of tissues / organs, reoperation, heart attack, death, and other risks were discussed. I noted a good likelihood this will help address the problem. Goals of post-operative recovery were discussed as well. Possibility that this will not correct all symptoms was explained. Post-operative pain, bleeding, constipation, and other problems after surgery were discussed. We will work to minimize complications. Educational handouts further explaining the pathology, treatment options, and bowel regimen were given as well. Questions were answered. The patient expresses understanding & wishes to proceed with surgery.     OR FINDINGS: Firm 4 x 4 cm sessile but fungating anal canal mass continuing to the anal verge. Primarily right lateral and posterior. Involving 30% of the circumference. Fixed to and probably going through the posterior anal sphincter complex. Grossly suspicious for cancer. Frozen biopsy done. Discussed with Dr. Jaquita Folds with pathology. Squamous cell carcinoma noted by him on frozen evaluation.Marland Kitchen Possible focus of high-grade differentiated versus neuroendocrine changes. No evidence of adenocarcinoma.   Small posterior midline sentinel tag. Excised.   7 cm pedunculated verrucous skin tag on left lateral external labia most likely consistent with condyloma versus atypical acrochordon. Excised. No other suspicious lesions.   DESCRIPTION:    Informed consent was confirmed. Patient underwent general anesthesia without difficulty. Patient was placed into prone positioning. The perianal region was prepped and draped in sterile fashion. Surgical time-out confirmed our plan.   I did digital rectal examination and then transitioned over to anoscopy to get  a sense of the anatomy. Findings noted above.   Patient clearly had a firm ulcerative fungating mostly sessile mass involving the anal canal. Fullness going through  the sphincters. Highly suspicious for anal cancer. I do scissors to sharply debulk the specimen without involving the sphincters. Sent this for frozen specimen. I assured hemostasis with focused cautery and interrupted Vicryl suture. Patient had persistent posterior midline anal tag. It seemed smooth like a sentinel tag from a prior chronic fissure. I excised it.   I reinspected and hemostasis was good after suturing and pressure held for 15 minutes while I waited for frozen specimen evaluation. And noted a skin mass on the left lateral labia that I excised vertically and closed with interrupted Monocryl suture and Dermabond.   Frozen specimen confirmed squamous cell cancer, unfortunately. No evidence of adenocarcinoma. Since this was not resectable and potentially better treated with chemoradiation therapy, I completed the case.   I had discussed postop care in detail with the patient in the preop holding area. Instructions for post-operative recovery and prescriptions are written. I discussed operative findings, updated the patient's status, discussed probable steps to recovery, and gave postoperative recommendations to the patient's spouse, Chairty Toman, [name]. Recommendations were made. Questions were answered. He expressed understanding & appreciation.   [name], M.D., F.A.C.S. Gastrointestinal and Minimally Invasive Surgery [facility] [address], [place], [place] [address] [phone] Main / Paging    PATHOLOGY:  SURGICAL PATHOLOGY  CASE: [id number]  PATIENT: Armida Sans  Surgical Pathology Report   Clinical History: Recurrent Anal rectal mass. History of anal cancer  ([place])   FINAL MICROSCOPIC DIAGNOSIS:   A. ANAL CANAL, MASS, EXCISION:  - Poorly differentiated carcinoma.  - Carcinoma extends to the edges of the biopsy.  - See comment.   B. RECTAL WALL, POSTERIOR, EXCISION:  - Poorly differentiated carcinoma.  - Carcinoma  extends to the edges of the biopsy.  - See comment.   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   INTRAOPERATIVE DIAGNOSIS:  A.  Anal canal, mass excision: "Malignant neoplasm - defer to permanent  sections."  Intraoperative diagnosis rendered by Dr. Vic Ripper at 15:58 on September 30, 2021.   Kimbree Casanas DESCRIPTION:   A. Received fresh for intraoperative consultation labeled with the  patient's name and "Anal canal mass" is a 1.2 x 1.0 x 0.8 cm red-tan,  rubbery nodule with white-tan cut surfaces. Representative sections are  submitted on a single chuck for frozen section analysis and subsequently  in cassette A1 for permanent. The remaining tissue is submitted in  cassette A2 for routine histology.   B. Received fresh and subsequently placed in formalin labeled with the  patient's name and "Posterior rectal wall mass" is a 2.8 x 1.9 x 0.8 cm  aggregate of red-tan, rubbery soft tissue.  Submitted in toto in  cassettes B1-B2.   (LEF 09/30/2021)   Final Diagnosis performed by Claudette Laws, MD.   Electronically signed  10/03/2021  Technical and / or Professional components performed at [facility], [place] [address], [place], [place] [address].   Immunohistochemistry Technical component (if applicable) was performed  at [facility]. [address] [place],  [place], [place] [address].   IMMUNOHISTOCHEMISTRY DISCLAIMER (if applicable):  Some of these immunohistochemical stains may have been developed and the  performance characteristics determine by [facility]. Some  may not have been cleared or approved by the U.S. Food and Drug  Administration. The FDA has determined that such clearance or approval  is not necessary. This test is used for clinical  purposes. It should not  be regarded as investigational or for research. This laboratory is  certified under the [place]  (CLIA-88) as qualified to perform high complexity clinical laboratory  testing.  The controls stained appropriately.   ############################################################  SURGICAL PATHOLOGY  CASE: [id number]  PATIENT: [facility]  Surgical Pathology Report   Clinical History: Anal canal mass with AIN III (crm)   FINAL MICROSCOPIC DIAGNOSIS:   A. 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 ANAL TAG, POSTERIOR, EXCISION:  -  Squamous cell carcinoma  -  See comment   C. LABIA, LEFT, LESION, EXCISION:  -  Condyloma acuminata   COMMENT:   A.  The excision specimen consists of two distinct histologic  components, "squamoid and neuroendocrine/basaloid".  By  immunohistochemistry, the squamous component is positive for p40 and p16  but negative for CD56, synaptophysin and chromogranin.  The  neuroendocrine component is positive for CD56 and p16 but negative for  p40, synaptophysin and chromogranin.  The neuroendocrine component has  an increased proliferative rate by Ki-67, supporting a high-grade  neuroendocrine carcinoma (small cell carcinoma).  Overall, this is  consistent with a mixed high-grade neuroendocrine and squamous cell  carcinoma.  Dr. Vic Ripper reviewed the case and agrees with the above  diagnosis.   B.  The biopsy consists of a polypoid fragment of squamous mucosa with  dysplasia and carcinoma present submucosally and in empty spaces.  It is  not clear whether these spaces represent retraction artifact or f  spaces.  The neoplastic cells are positive for p16 but negative for  CD56, supporting the diagnosis of squamous cell carcinoma.   C.  p16 immunohistochemistry supports the absence of high-grade  dysplasia.    INTRAOPERATIVE DIAGNOSIS:   A.  Anal rectal mass: "Squamous cell carcinoma.  Separate possible  component with a different morphology and myxoid stroma-defer to  permanent sections"  Intraoperative diagnosis rendered by Dr. Vic Ripper at 11:31 AM on  02/25/2021.   Madyn Ivins DESCRIPTION:   A: The specimen is received fresh for rapid intraoperative consultation  and consists of a 3.5 x 2.0 x 1.5 cm aggregate of tan-red soft tissue.  The 2 larger portions of tissue are bisected, the specimen is entirely  submitted for frozen section analysis in 2 cassettes.  Remnants are  resubmitted for permanent.   B: The specimen is received in formalin and consists of a 0.8 x 0.6 x  0.3 cm piece of tan-pink soft tissue.  The specimen is entirely  submitted in 1 cassette.   C: The specimen is received in formalin and consists of a 1.2 x 0.8 x  0.5 cm tan-pink, verrucoid piece of soft tissue.  Resection margin is  inked black, the specimen is bisected and entirely submitted in 1  cassette.  Craig Staggers 02/25/2021)   Final Diagnosis performed by Thressa Sheller, MD.   Electronically signed  03/02/2021  Technical component performed at [facility]  [address], [place], [place] [address].   Professional component performed at [employer]. [facility],  [place] [address] [place], [place] [address].   Immunohistochemistry Technical component (if applicable)  was performed  at [facility]. [address], [place],  [place], [place] [address].   IMMUNOHISTOCHEMISTRY DISCLAIMER (if applicable):  Some of these immunohistochemical stains may have been developed and the  performance characteristics determine by [facility]. Some  may not have been cleared or approved by the U.S. Food and Drug  Administration. The FDA has determined that such clearance or approval  is not necessary. This test is used for clinical purposes. It should not  be regarded as  investigational or for research. This laboratory is  certified under the [place]  (CLIA-88) as qualified to perform high complexity clinical laboratory  testing.  The controls stained appropriately.  Physical Examination:   Constitutional: Not cachectic. Hygeine adequate. Vitals signs as above.  Eyes: Normal extraocular movements. Sclera nonicteric Neuro: No major focal sensory defects. No major motor deficits. Psych: No severe agitation. No severe anxiety. Judgment & insight Adequate, Oriented x4, HENT: Normocephalic, Mucus membranes moist. No thrush.  Neck: Supple, No tracheal deviation. No obvious thyromegaly Chest: No pain to chest wall compression. Good respiratory excursion. No audible wheezing CV: Regular rhythm. No major extremity edema  I did not do a rectal exam today given recent examination under anesthesia ###################################  Assessment and Plan:   Caelyn Route is a 50 y.o. female recovering s/p chemoradiation therapy completed Apr 27, 2021. Examination under anesthesia for anal cancer.  There are no diagnoses linked to this encounter.   Recurrent rectal bleeding and rectal mass with biopsy showing poorly differentiated carcinoma consistent with recurrent anal cancer 6 months after completing chemoradiation therapy.   As I discussed before, I recommended abdominal perineal resection to get rid of the recurrent cancer. Have consulted Dr. Iran Planas with plastic reconstructive surgery for VRAM flap for pelvic floor reconstruction. Permanent colostomy. Have urology do cystoscopy and firefly injection to help clarify the ureteral and bladder anatomy and protected. She has many appropriate questions about bowel prep and technique.  The anatomy & physiology of the digestive tract was discussed. The pathophysiology of the rectal pathology was discussed. Natural history risks without surgery was discussed. I worked to give  an overview of the disease and the frequent need to have multispecialty involvement. I feel the risks of no intervention will lead to serious problems that outweigh the operative risks; therefore, I recommended a partial proctocolectomy to remove the pathology. Minimally Invasive (Robotic/Laparoscopic) & open techniques were discussed.   Risks such as bleeding, infection, abscess, leak, reoperation, permanent ostomy, hernia, stroke, heart attack, death, and other risks were discussed. I noted a good likelihood this will help address the problem. Goals of post-operative recovery were discussed as well. We will work to minimize complications. Educational information was available as well. Questions were answered. The patient expresses understanding & wishes to proceed with surgery.  [name], MD, FACS, MASCRS Esophageal, Gastrointestinal & Colorectal Surgery Robotic and Minimally Invasive Surgery  [facility], [place]  [place]. [address], [place], [place] [address] [phone] Fax [phone] Main  CONTACT INFORMATION:  Weekday (9AM-5PM): Call [facility] main office at [phone]  Weeknight (5PM-9AM) or Weekend/Holiday: Check [email or web address] (password " [email or web address]") for General Surgery [facility] coverage  (Please, do not use SecureChat as it is not reliable communication to operating surgeons for immediate patient care)    11/03/2021

## 2021-11-03 NOTE — Interval H&P Note (Signed)
History and Physical Interval Note:  11/03/2021 7:08 AM  Linus Orn S Fedor  has presented today for surgery, with the diagnosis of RECURRENT ANAL CANCER.  The various methods of treatment have been discussed with the patient and family. After consideration of risks, benefits and other options for treatment, the patient has consented to  Procedure(s): XI ROBOT ABDOMINAL PERINEAL RESECTION (N/A) POSSIBLE OSTOMY (N/A) RIGID PROCTOSCOPY (N/A) CYSTOSCOPY with FIREFLY INJECTION (N/A) VERTICAL RECTUS ABDOMINIS MYOCUTANEOUS TO PERINEUM (N/A) as a surgical intervention.  The patient's history has been reviewed, patient examined, no change in status, stable for surgery.  I have reviewed the patient's chart and labs.  Questions were answered to the patient's satisfaction.    I have re-reviewed the the patient's records, history, medications, and allergies.  I have re-examined the patient.  I again discussed intraoperative plans and goals of post-operative recovery.  The patient agrees to proceed.  Eldana Isip Orban  04/09/1971 [id number]  Patient Care Team: Caryl Bis, MD as PCP - General (Unknown Physician Specialty) Michael Boston, MD as Consulting Physician (General Surgery) Wonda Horner, MD as Consulting Physician (Gastroenterology) [name], MD as Referring Physician (Obstetrics and Gynecology) Virgel Manifold, MD as Consulting Physician (Gastroenterology) Ladell Pier, MD as Consulting Physician (Oncology)  Patient Active Problem List   Diagnosis Date Noted   Squamous cell carcinoma of anal canal ([place]) 02/25/2021    Priority: High   Anal squamous cell carcinoma ([place]) 03/01/2021   History of migraine    Encounter for screening colonoscopy    Rectal polyp    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 08/30/2016 10/07/2016   Tobacco abuse 08/08/2016   Chronic anal fissure s/p partial sphincterotomy 08/30/2016 08/08/2016   Constipation, chronic  08/08/2016    Past Medical History:  Diagnosis Date   Acne    on face spirolactone for   Anal cancer ([place]) 02/25/2021   Constipation, chronic 08/08/2016   History of migraine    none in years   Wears glasses     Past Surgical History:  Procedure Laterality Date   ABDOMINAL HYSTERECTOMY  2016   partial   COLONOSCOPY     COLONOSCOPY WITH PROPOFOL N/A 01/28/2021   Procedure: COLONOSCOPY WITH PROPOFOL;  Surgeon: Virgel Manifold, MD;  Location: [facility];  Service: Endoscopy;  Laterality: N/A;   EXCISION HYDRADENITIS LABIA Left 02/25/2021   Procedure: EXCISION of  LABIAL LESIOM;  Surgeon: Michael Boston, MD;  Location: [facility];  Service: General;  Laterality: Left;   IR FLUORO GUIDE CV LINE RIGHT  04/18/2021   RECTAL BIOPSY N/A 09/30/2021   Procedure: ANORECTAL MASS BIOPSY;  Surgeon: Michael Boston, MD;  Location: [facility];  Service: General;  Laterality: N/A;   RECTAL EXAM UNDER ANESTHESIA N/A 02/25/2021   Procedure: ANORECTAL EXAM UNDER ANESTHESIA;  Surgeon: Michael Boston, MD;  Location: [facility];  Service: General;  Laterality: N/A;   RECTAL EXAM UNDER ANESTHESIA N/A 09/30/2021   Procedure: ANORECTAL EXAMINATION UNDER ANESTHESIA;  Surgeon: Michael Boston, MD;  Location: [facility];  Service: General;  Laterality: N/A;  GEN & LOCAL ANESTHESIA   SPHINCTEROTOMY N/A 08/30/2016   Procedure: LATERAL INTERNAL AND SPHINCTEROTOMY;  Surgeon: Michael Boston, MD;  [facility];  Service: General;  Laterality: N/A;   TONSILLECTOMY  as child   TRANSANAL EXCISION OF RECTAL MASS N/A 02/25/2021   Procedure: TRANSANAL EXCISIONAL  BIOPSY OF ANAL CANAL MASS;  Surgeon: Michael Boston, MD;  Location:  [facility];  Service: General;  Laterality: N/A;    Social History   Socioeconomic History   Marital status: Married    Spouse name: Not on file   Number of children: Not on file   Years of education: Not on file   Highest education level: Not on file   Occupational History   Not on file  Tobacco Use   Smoking status: Former    Packs/day: 1.00    Years: 8.00    Pack years: 8.00    Types: Cigarettes    Quit date: 02/15/2021    Years since quitting: 0.7   Smokeless tobacco: Never  Vaping Use   Vaping Use: Never used  Substance and Sexual Activity   Alcohol use: Yes    Comment: 2 dirnks daily   Drug use: No   Sexual activity: Yes    Birth control/protection: Surgical    Comment: Hysterectomy  Other Topics Concern   Not on file  Social History Narrative   Not on file   Social Determinants of Health   Financial Resource Strain: Not on file  Food Insecurity: Not on file  Transportation Needs: Not on file  Physical Activity: Not on file  Stress: Not on file  Social Connections: Not on file  Intimate Partner Violence: Not on file    Family History  Problem Relation Age of Onset   Breast cancer Paternal Aunt     Medications Prior to Admission  Medication Sig Dispense Refill Last Dose   Biotin w/ Vitamins C & E (HAIR SKIN & NAILS GUMMIES PO) Take 2 tablets by mouth daily. Gummies   Past Week   bisacodyl (DULCOLAX) 5 MG EC tablet Take by mouth.   Past Week   cetirizine (ZYRTEC) 10 MG tablet Take 10 mg by mouth daily.   Past Week   gabapentin (NEURONTIN) 300 MG capsule Take 1 capsule (300 mg total) by mouth 3 (three) times daily for 3 days. 90 capsule 3 11/02/2021   metroNIDAZOLE (FLAGYL) 500 MG tablet Take by mouth.   11/02/2021   neomycin (MYCIFRADIN) 500 MG tablet Take 1,000 mg by mouth 3 (three) times daily.   11/02/2021   oxyCODONE (OXY IR/ROXICODONE) 5 MG immediate release tablet Take 1-2 tablets (5-10 mg total) by mouth every 6 (six) hours as needed for moderate pain, severe pain or breakthrough pain. 30 tablet 0 Past Week   spironolactone (ALDACTONE) 100 MG tablet Take 100 mg by mouth daily.   Past Week   triamcinolone cream (KENALOG) 0.1 % Apply 1 application topically 4 (four) times daily as needed (eczema).   Past  Week    Current Facility-Administered Medications  Medication Dose Route Frequency Provider Last Rate Last Admin   acetaminophen (OFIRMEV) 10 MG/ML IV            acetaminophen (OFIRMEV) IV 1,000 mg  1,000 mg Intravenous Once Merlinda Frederick, MD       acetaminophen (TYLENOL) tablet 1,000 mg  1,000 mg Oral On Call to OR Michael Boston, MD       bupivacaine liposome (EXPAREL) 1.3 % injection 266 mg  20 mL Infiltration Once Michael Boston, MD       cefoTEtan (CEFOTAN) 2 g in sodium chloride 0.9 % 100 mL IVPB  2 g Intravenous On Call to OR Michael Boston, MD       Chlorhexidine Gluconate Cloth 2 % PADS 6 each  6 each Topical Once Michael Boston, MD  And   Chlorhexidine Gluconate Cloth 2 % PADS 6 each  6 each Topical Once Michael Boston, MD       Derrill Memo ON 11/04/2021] feeding supplement (ENSURE PRE-SURGERY) liquid 296 mL  296 mL Oral Once Michael Boston, MD       feeding supplement (ENSURE PRE-SURGERY) liquid 592 mL  592 mL Oral Once Michael Boston, MD       lactated ringers infusion   Intravenous Continuous Barnet Glasgow, MD 10 mL/hr at 11/03/21 0545 New Bag at 11/03/21 0545   scopolamine (TRANSDERM-SCOP) 1 MG/3DAYS 1.5 mg  1 patch Transdermal Q72H Merlinda Frederick, MD   1.5 mg at 11/03/21 9211     Allergies  Allergen Reactions   Clindamycin/Lincomycin Rash   Sulfa Drugs Cross Reactors Itching and Rash    BP (!) 140/101   Pulse 89   Temp 97.9 F (36.6 C) (Oral)   Resp 18   Ht 5' 6.5" (1.689 m)   Wt 69.6 kg   LMP 03/13/2012   SpO2 100%   BMI 24.40 kg/m   Labs: Results for orders placed or performed during the hospital encounter of 11/03/21 (from the past 48 hour(s))  Type and screen [place]     Status: None (Preliminary result)   Collection Time: 11/03/21  6:19 AM  Result Value Ref Range   ABO/RH(D) PENDING    Antibody Screen PENDING    Sample Expiration      11/06/2021,2359 Performed at [facility], [place] [address] [place],  [place] [address]     Imaging / Studies: MR BREAST BILATERAL W WO CONTRAST INC CAD  Result Date: 10/13/2021 CLINICAL DATA:  Patient was diagnosed with anal carcinoma early and 2022. Staging PET-CT demonstrated a small focal area of uptake along the posterior fibroglandular tissue of the upper inner right breast. She underwent diagnostic imaging of the right breast on 03/28/2021 with no mammographic or sonographic evidence of malignancy or correlate to the abnormal uptake noted on the PET CT. Follow-up imaging with breast MRI was recommended due to the PET-CT findings. On the breast MRI dated 03/31/2021 a 7 mm enhancing mass was described in the upper inner posterior right breast. This subsequently underwent MRI guided biopsy on 04/11/2021, with pathology revealing fibrocystic changes and sclerosing adenosis, as well as a small focus of inflammation with the suggestion of resolving fat necrosis. No malignancy or atypia. Current exam is protocol for follow-up following a benign MRI guided biopsy. LABS:  No labs drawn at time of imaging. EXAM: BILATERAL BREAST MRI WITH AND WITHOUT CONTRAST TECHNIQUE: Multiplanar, multisequence MR images of both breasts were obtained prior to and following the intravenous administration of 7 ml of Gadavist Three-dimensional MR images were rendered by post-processing of the original MR data on an independent workstation. The three-dimensional MR images were interpreted, and findings are reported in the following complete MRI report for this study. Three dimensional images were evaluated at the independent interpreting workstation using the DynaCAD thin client. COMPARISON:  Previous exam(s). FINDINGS: Breast composition: c. Heterogeneous fibroglandular tissue. Background parenchymal enhancement: Mild focus of susceptibility Right breast: No mass or abnormal enhancement. Artifact in the posterior, slightly medial aspect the breast, just above midline, centered on image 79, series 3,  consistent with the post biopsy marker clip from the prior benign biopsy. There is no associated abnormal enhancement. Left breast: No mass or abnormal enhancement. Lymph nodes: No abnormal appearing lymph nodes. Ancillary findings:  None. IMPRESSION: 1. No evidence of  breast malignancy. No current mass or abnormal enhancement seen adjacent to the post biopsy marker clip in the posterior, upper inner right breast. RECOMMENDATION: 1. Annual screening mammography. BI-RADS CATEGORY  1: Negative. Electronically Signed   By: Lajean Manes M.D.   On: 10/13/2021 10:58    .[name], M.D., F.A.C.S. Gastrointestinal and Minimally Invasive Surgery [facility] [address] [place], [place] [address] [phone] Main / Paging  11/03/2021 7:08 AM    [name]

## 2021-11-03 NOTE — Discharge Instructions (Addendum)
SURGERY: POST OP INSTRUCTIONS (Surgery for small bowel obstruction, colon resection, etc)   ######################################################################  EAT Gradually transition to a high fiber diet with a fiber supplement over the next few days after discharge  WALK Walk an hour a day.  Control your pain to do that.    CONTROL PAIN Control pain so that you can walk, sleep, tolerate sneezing/coughing, go up/down stairs.  HAVE A BOWEL MOVEMENT DAILY Keep your bowels regular to avoid problems.  OK to try a laxative to override constipation.  OK to use an antidairrheal to slow down diarrhea.  Call if not better after 2 tries  CALL IF YOU HAVE PROBLEMS/CONCERNS Call if you are still struggling despite following these instructions. Call if you have concerns not answered by these instructions  ######################################################################   DIET Follow a light diet the first few days at home.  Start with a bland diet such as soups, liquids, starchy foods, low fat foods, etc.  If you feel full, bloated, or constipated, stay on a ful liquid or pureed/blenderized diet for a few days until you feel better and no longer constipated. Be sure to drink plenty of fluids every day to avoid getting dehydrated (feeling dizzy, not urinating, etc.). Gradually add a fiber supplement to your diet over the next week.  Gradually get back to a regular solid diet.  Avoid fast food or heavy meals the first week as you are more likely to get nauseated. It is expected for your digestive tract to need a few months to get back to normal.  It is common for your bowel movements and stools to be irregular.  You will have occasional bloating and cramping that should eventually fade away.  Until you are eating solid food normally, off all pain medications, and back to regular activities; your bowels will not be normal. Focus on eating a low-fat, high fiber diet the rest of your life  (See Getting to [place], below).  CARE of your INCISION or WOUND It is good for closed incision and even open wounds to be washed every day.  Shower every day.  Short baths are fine.  Wash the incisions and wounds clean with soap & water.     If you have a closed incision(s), wash the incision with soap & water every day.  You may leave closed incisions open to air if it is dry.   You may cover the incision with clean gauze & replace it after your daily shower for comfort.  It is good for closed incisions and even open wounds to be washed every day.  Shower every day.  Short baths are fine.  Wash the incisions and wounds clean with soap & water.    You may leave closed incisions open to air if it is dry.   You may cover the incision with clean gauze & replace it after your daily shower for comfort.  TEGADERM:  You have clear gauze band-aid dressings over your closed incision(s).  Remove the dressings 3 days after surgery.   If you have an open wound with a wound vac, see wound vac care instructions.     ACTIVITIES as tolerated Start light daily activities --- self-care, walking, climbing stairs-- beginning the day after surgery.  Gradually increase activities as tolerated.  Control your pain to be active.  Stop when you are tired.  Ideally, walk several times a day, eventually an hour a day.   Most people are back to most day-to-day activities in  a few weeks.  It takes 4-8 weeks to get back to unrestricted, intense activity. If you can walk 30 minutes without difficulty, it is safe to try more intense activity such as jogging, treadmill, bicycling, low-impact aerobics, swimming, etc. Save the most intensive and strenuous activity for last (Usually 4-8 weeks after surgery) such as sit-ups, heavy lifting, contact sports, etc.  Refrain from any intense heavy lifting or straining until you are off narcotics for pain control.  You will have off days, but things should improve  week-by-week. DO NOT PUSH THROUGH PAIN.  Let pain be your guide: If it hurts to do something, don't do it.  Pain is your body warning you to avoid that activity for another week until the pain goes down. You may drive when you are no longer taking narcotic prescription pain medication, you can comfortably wear a seatbelt, and you can safely make sudden turns/stops to protect yourself without hesitating due to pain. You may have sexual intercourse when it is comfortable. If it hurts to do something, stop.  MEDICATIONS Take your usually prescribed home medications unless otherwise directed.   Blood thinners:  Usually you can restart any strong blood thinners after the second postoperative day.  It is OK to take aspirin right away.     If you are on strong blood thinners (warfarin/Coumadin, Plavix, Xerelto, Eliquis, Pradaxa, etc), discuss with your surgeon, medicine PCP, and/or cardiologist for instructions on when to restart the blood thinner & if blood monitoring is needed (PT/INR blood check, etc).     PAIN CONTROL Pain after surgery or related to activity is often due to strain/injury to muscle, tendon, nerves and/or incisions.  This pain is usually short-term and will improve in a few months.  To help speed the process of healing and to get back to regular activity more quickly, DO THE FOLLOWING THINGS TOGETHER: Increase activity gradually.  DO NOT PUSH THROUGH PAIN Use Ice and/or Heat Try Gentle Massage and/or Stretching Take over the counter pain medication Take Narcotic prescription pain medication for more severe pain  Good pain control = faster recovery.  It is better to take more medicine to be more active than to stay in bed all day to avoid medications.  Increase activity gradually Avoid heavy lifting at first, then increase to lifting as tolerated over the next 6 weeks. Do not "push through" the pain.  Listen to your body and avoid positions and maneuvers than reproduce the pain.   Wait a few days before trying something more intense Walking an hour a day is encouraged to help your body recover faster and more safely.  Start slowly and stop when getting sore.  If you can walk 30 minutes without stopping or pain, you can try more intense activity (running, jogging, aerobics, cycling, swimming, treadmill, sex, sports, weightlifting, etc.) Remember: If it hurts to do it, then don't do it! Use Ice and/or Heat You will have swelling and bruising around the incisions.  This will take several weeks to resolve. Ice packs or heating pads (6-8 times a day, 30-60 minutes at a time) will help sooth soreness & bruising. Some people prefer to use ice alone, heat alone, or alternate between ice & heat.  Experiment and see what works best for you.  Consider trying ice for the first few days to help decrease swelling and bruising; then, switch to heat to help relax sore spots and speed recovery. Shower every day.  Short baths are fine.  It feels good!  Keep the incisions and wounds clean with soap & water.   Try Gentle Massage and/or Stretching Massage at the area of pain many times a day Stop if you feel pain - do not overdo it Take over the counter pain medication This helps the muscle and nerve tissues become less irritable and calm down faster Choose ONE of the following over-the-counter anti-inflammatory medications: Acetaminophen 578m tabs (Tylenol) 1-2 pills with every meal and just before bedtime (avoid if you have liver problems or if you have acetaminophen in you narcotic prescription) Naproxen 227mtabs (ex. Aleve, Naprosyn) 1-2 pills twice a day (avoid if you have kidney, stomach, IBD, or bleeding problems) Ibuprofen 20073mabs (ex. Advil, Motrin) 3-4 pills with every meal and just before bedtime (avoid if you have kidney, stomach, IBD, or bleeding problems) Take with food/snack several times a day as directed for at least 2 weeks to help keep pain / soreness down & more  manageable. Take Narcotic prescription pain medication for more severe pain A prescription for strong pain control is often given to you upon discharge (for example: oxycodone/Percocet, hydrocodone/Norco/Vicodin, or tramadol/Ultram) Take your pain medication as prescribed. Be mindful that most narcotic prescriptions contain Tylenol (acetaminophen) as well - avoid taking too much Tylenol. If you are having problems/concerns with the prescription medicine (does not control pain, nausea, vomiting, rash, itching, etc.), please call us [phone] see if we need to switch you to a different pain medicine that will work better for you and/or control your side effects better. If you need a refill on your pain medication, you must call the office before 4 pm and on weekdays only.  By federal law, prescriptions for narcotics cannot be called into a pharmacy.  They must be filled out on paper & picked up from our office by the patient or authorized caretaker.  Prescriptions cannot be filled after 4 pm nor on weekends.    WHEN TO CALL US [phone] uncontrolled or worsening pain  Fever over 101 F (38.5 C) Concerns with the incision: Worsening pain, redness, rash/hives, swelling, bleeding, or drainage Reactions / problems with new medications (itching, rash, hives, nausea, etc.) Nausea and/or vomiting Difficulty urinating Difficulty breathing Worsening fatigue, dizziness, lightheadedness, blurred vision Other concerns If you are not getting better after two weeks or are noticing you are getting worse, contact our office [phone] for further advice.  We may need to adjust your medications, re-evaluate you in the office, send you to the emergency room, or see what other things we can do to help. The clinic staff is available to answer your questions during regular business hours (8:30am-5pm).  Please don't hesitate to call and ask to speak to one of our nurses for clinical concerns.    A  surgeon from [facility] is always on call at the hospitals 24 hours/day If you have a medical emergency, go to the nearest emergency room or call 911.  FOLLOW UP in our office One the day of your discharge from the hospital (or the next business weekday), please call [facility] to set up or confirm an appointment to see your surgeon in the office for a follow-up appointment.  Usually it is 2-3 weeks after your surgery.   If you have skin staples at your incision(s), let the office know so we can set up a time in the office for the nurse to remove them (usually around 10 days after surgery). Make sure that you call for  appointments the day of discharge (or the next business weekday) from the hospital to ensure a convenient appointment time. IF YOU HAVE DISABILITY OR FAMILY LEAVE FORMS, BRING THEM TO THE OFFICE FOR PROCESSING.  DO NOT GIVE THEM TO YOUR DOCTOR.  [facility] [address] [place], [place], [place]  [address] ? [phone] - Main [phone] - [place],  [phone] - Fax [email or web address]  GETTING TO GOOD BOWEL HEALTH. It is expected for your digestive tract to need a few months to get back to normal.  It is common for your bowel movements and stools to be irregular.  You will have occasional bloating and cramping that should eventually fade away.  Until you are eating solid food normally, off all pain medications, and back to regular activities; your bowels will not be normal.   Avoiding constipation The goal: ONE SOFT BOWEL MOVEMENT A DAY!    Drink plenty of fluids.  Choose water first. TAKE A FIBER SUPPLEMENT EVERY DAY THE REST OF YOUR LIFE During your first week back home, gradually add back a fiber supplement every day Experiment which form you can tolerate.   There are many forms such as powders, tablets, wafers, gummies, etc Psyllium bran (Metamucil), methylcellulose (Citrucel), Miralax or Glycolax,  Benefiber, Flax Seed.  Adjust the dose week-by-week (1/2 dose/day to 6 doses a day) until you are moving your bowels 1-2 times a day.  Cut back the dose or try a different fiber product if it is giving you problems such as diarrhea or bloating. Sometimes a laxative is needed to help jump-start bowels if constipated until the fiber supplement can help regulate your bowels.  If you are tolerating eating & you are farting, it is okay to try a gentle laxative such as double dose MiraLax, prune juice, or Milk of Magnesia.  Avoid using laxatives too often. Stool softeners can sometimes help counteract the constipating effects of narcotic pain medicines.  It can also cause diarrhea, so avoid using for too long. If you are still constipated despite taking fiber daily, eating solids, and a few doses of laxatives, call our office. Controlling diarrhea Try drinking liquids and eating bland foods for a few days to avoid stressing your intestines further. Avoid dairy products (especially milk & ice cream) for a short time.  The intestines often can lose the ability to digest lactose when stressed. Avoid foods that cause gassiness or bloating.  Typical foods include beans and other legumes, cabbage, broccoli, and dairy foods.  Avoid greasy, spicy, fast foods.  Every person has some sensitivity to other foods, so listen to your body and avoid those foods that trigger problems for you. Probiotics (such as active yogurt, Align, etc) may help repopulate the intestines and colon with normal bacteria and calm down a sensitive digestive tract Adding a fiber supplement gradually can help thicken stools by absorbing excess fluid and retrain the intestines to act more normally.  Slowly increase the dose over a few weeks.  Too much fiber too soon can backfire and cause cramping & bloating. It is okay to try and slow down diarrhea with a few doses of antidiarrheal medicines.   Bismuth subsalicylate (ex. Kayopectate, Pepto Bismol)  for a few doses can help control diarrhea.  Avoid if pregnant.   Loperamide (Imodium) can slow down diarrhea.  Start with one tablet (47m) first.  Avoid if you are having fevers or severe pain.  ILEOSTOMY PATIENTS WILL HAVE CHRONIC DIARRHEA since their colon is  not in use.    Drink plenty of liquids.  You will need to drink even more glasses of water/liquid a day to avoid getting dehydrated. Record output from your ileostomy.  Expect to empty the bag every 3-4 hours at first.  Most people with a permanent ileostomy empty their bag 4-6 times at the least.   Use antidiarrheal medicine (especially Imodium) several times a day to avoid getting dehydrated.  Start with a dose at bedtime & breakfast.  Adjust up or down as needed.  Increase antidiarrheal medications as directed to avoid emptying the bag more than 8 times a day (every 3 hours). Work with your wound ostomy nurse to learn care for your ostomy.  See ostomy care instructions. TROUBLESHOOTING IRREGULAR BOWELS 1) Start with a soft & bland diet. No spicy, greasy, or fried foods.  2) Avoid gluten/wheat or dairy products from diet to see if symptoms improve. 3) Miralax 17gm or flax seed mixed in [place]. water or juice-daily. May use 2-4 times a day as needed. 4) Gas-X, Phazyme, etc. as needed for gas & bloating.  5) Prilosec (omeprazole) over-the-counter as needed 6)  Consider probiotics (Align, Activa, etc) to help calm the bowels down  Call your doctor if you are getting worse or not getting better.  Sometimes further testing (cultures, endoscopy, X-ray studies, CT scans, bloodwork, etc.) may be needed to help diagnose and treat the cause of the diarrhea. [facility], [place], [place], [place], [place]  [address] [phone] - Main.    [phone]  - Toll Free.   [phone] - Fax [email or web address]   #########################   Ostomy Support Information  You've heard that people get along  just fine with only one of their eyes, or one of their lungs, or one of their kidneys. But you also know that you have only one intestine and only one bladder, and that leaves you feeling awfully empty, both physically and emotionally: You think no other people go around without part of their intestine with the ends of their intestines sticking out through their abdominal walls.   YOU ARE NOT ALONE.  There are nearly three quarters of a million people in the [place] who have an ostomy; people who have had surgery to remove all or part of their colons or bladders.   There is even a national association, the [place] [employer] [place] with over 350 local affiliated support groups that are organized by volunteers who provide peer support and counseling. [place] has a toll free telephone num-ber, [phone] and an educational, interactive website, www.ostomy.org   An ostomy is an opening in the belly (abdominal wall) made by surgery. Ostomates are people who have had this procedure. The opening (stoma) allows the kidney or bowel to discharge waste. An external pouch covers the stoma to collect waste. Pouches are are a simple bag and are odor free. Different companies have disposable or reusable pouches to fit one's lifestyle. An ostomy can either be temporary or permanent.   THERE ARE THREE MAIN TYPES OF OSTOMIES Colostomy. A colostomy is a surgically created opening in the large intestine (colon). Ileostomy. An ileostomy is a surgically created opening in the small intestine. Urostomy. A urostomy is a surgically created opening to divert urine away from the bladder.  OSTOMY Care  The following guidelines will make care of your colostomy easier. Keep this information close by for quick reference.  Helpful DIET hints Eat a well-balanced diet including vegetables and fresh  fruits. Eat on a regular schedule.  Drink at least 6 to 8 glasses of fluids daily. Eat slowly in a relaxed atmosphere. Chew  your food thoroughly. Avoid chewing gum, smoking, and drinking from a straw. This will help decrease the amount of air you swallow, which may help reduce gas. Eating yogurt or drinking buttermilk may help reduce gas.  To control gas at night, do not eat after 8 p.m. This will give your bowel time to quiet down before you go to bed.  If gas is a problem, you can purchase Beano. Sprinkle Beano on the first bite of food before eating to reduce gas. It has no flavor and should not change the taste of your food. You can buy Beano over the counter at your local drugstore.  Foods like fish, onions, garlic, broccoli, asparagus, and cabbage produce odor. Although your pouch is odor-proof, if you eat these foods you may notice a stronger odor when emptying your pouch. If this is a concern, you may want to limit these foods in your diet.  If you have an ileostomy, you will have chronic diarrhea & need to drink more liquids to avoid getting dehydrated.  Consider antidiarrheal medicine like imodium (loperamide) or Lomotil to help slow down bowel movements / diarrhea into your ileostomy bag.  GETTING TO GOOD BOWEL HEALTH WITH AN ILEOSTOMY    With the colon bypassed & not in use, you will have small bowel diarrhea.   It is important to thicken & slow your bowel movements down.   The goal: 4-6 small BOWEL MOVEMENTS A DAY It is important to drink plenty of liquids to avoid getting dehydrated  CONTROLLING ILEOSTOMY DIARRHEA  TAKE A FIBER SUPPLEMENT (FiberCon or Benefiner soluble fiber) twice a day - to thicken stools by absorbing excess fluid and retrain the intestines to act more normally.  Slowly increase the dose over a few weeks.  Too much fiber too soon can backfire and cause cramping & bloating.  TAKE AN IRON SUPPLEMENT twice a day to naturally constipate your bowels.  Usually ferrous sulfate 348m twice a day)  TAKE ANTI-DIARRHEAL MEDICINES: Loperamide (Imodium) can slow down diarrhea.  Start with two  tablets (= 411m first and then try one tablet every 6 hours.  Can go up to 2 pills four times day (8 pills of 32m75max) Avoid if you are having fevers or severe pain.  If you are not better or start feeling worse, stop all medicines and call your doctor for advice LoMotil (Diphenoxylate / Atropine) is another medicine that can constipate & slow down bowel moevements Pepto Bismol (bismuth) can gently thicken bowels as well  If diarrhea is worse,: drink plenty of liquids and try simpler foods for a few days to avoid stressing your intestines further. Avoid dairy products (especially milk & ice cream) for a short time.  The intestines often can lose the ability to digest lactose when stressed. Avoid foods that cause gassiness or bloating.  Typical foods include beans and other legumes, cabbage, broccoli, and dairy foods.  Every person has some sensitivity to other foods, so listen to our body and avoid those foods that trigger problems for you.Call your doctor if you are getting worse or not better.  Sometimes further testing (cultures, endoscopy, X-ray studies, bloodwork, etc) may be needed to help diagnose and treat the cause of the diarrhea. Take extra anti-diarrheal medicines (maximum is 8 pills of 32mg31mperamide a day)   Tips for POUCHING an OSTOMY  Changing Your Pouch The best time to change your pouch is in the morning, before eating or drinking anything. Your stoma can function at any time, but it will function more after eating or drinking.   Applying the pouching system  Place all your equipment close at hand before removing your pouch.  Wash your hands.  Stand or sit in front of a mirror. Use the position that works best for you. Remember that you must keep the skin around the stoma wrinkle-free for a good seal.  Gently remove the used pouch (1-piece system) or the pouch and old wafer (2-piece system). Empty the pouch into the toilet. Save the closure clip to use again.  Wash the  stoma itself and the skin around the stoma. Your stoma may bleed a little when being washed. This is normal. Rinse and pat dry. You may use a wash cloth or soft paper towels (like Bounty), mild soap (like Dial, Safeguard, or [place]), and water. Avoid soaps that contain perfumes or lotions.  For a new pouch (1-piece system) or a new wafer (2-piece system), measure your stoma using the stoma guide in each box of supplies.  Trace the shape of your stoma onto the back of the new pouch or the back of the new wafer. Cut out the opening. Remove the paper backing and set it aside.  Optional: Apply a skin barrier powder to surrounding skin if it is irritated (bare or weeping), and dust off the excess. Optional: Apply a skin-prep wipe (such as Skin Prep or All-Kare) to the skin around the stoma, and let it dry. Do not apply this solution if the skin is irritated (red, tender, or broken) or if you have shaved around the stoma. Optional: Apply a skin barrier paste (such as Stomahesive, Coloplast, or Premium) around the opening cut in the back of the pouch or wafer. Allow it to dry for 30 to 60 seconds.  Hold the pouch (1-piece system) or wafer (2-piece system) with the sticky side toward your body. Make sure the skin around the stoma is wrinkle-free. Center the opening on the stoma, then press firmly to your abdomen (Fig. 4). Look in the mirror to check if you are placing the pouch, or wafer, in the right position. For a 2-piece system, snap the pouch onto the wafer. Make sure it snaps into place securely.  Place your hand over the stoma and the pouch or wafer for about 30 seconds. The heat from your hand can help the pouch or wafer stick to your skin.  Add deodorant (such as Super Banish or Nullo) to your pouch. Other options include food extracts such as vanilla oil and peppermint extract. Add about 10 drops of the deodorant to the pouch. Then apply the closure clamp. Note: Do not use toxic  chemicals or  commercial cleaning agents in your pouch. These substances may harm the stoma.  Optional: For extra seal, apply tape to all 4 sides around the pouch or wafer, as if you were framing a picture. You may use any brand of medical adhesive tape. Change your pouch every 5 to 7 days. Change it immediately if a leak occurs.  Wash your hands afterwards.  If you are wearing a 2-piece system, you may use 2 new pouches per week and alternate them. Rinse the pouch with mild soap and warm water and hang it to dry for the next day. Apply the fresh pouch. Alternate the 2 pouches like this for a week. After a  week, change the wafer and begin with 2 new pouches. Place the old pouches in a plastic bag, and put them in the trash.   LIVING WITH AN OSTOMY  Emptying Your Pouch Empty your pouch when it is one-third full (of urine, stool, and/or gas). If you wait until your pouch is fuller than this, it will be more difficult to empty and more noticeable. When you empty your pouch, either put toilet paper in the toilet bowl first, or flush the toilet while you empty the pouch. This will reduce splashing. You can empty the pouch between your legs or to one side while sitting, or while standing or stooping. If you have a 2-piece system, you can snap off the pouch to empty it. Remember that your stoma may function during this time. If you wish to rinse your pouch after you empty it, a [place] baster can be helpful. When using a baster, squirt water up into the pouch through the opening at the bottom. With a 2-piece system, you can snap off the pouch to rinse it. After rinsing  your pouch, empty it into the toilet. When rinsing your pouch at home, put a few granules of Dreft soap in the rinse water. This helps lubricate and freshen your pouch. The inside of your pouch can be sprayed with non-stick cooking oil (Pam spray). This may help reduce stool sticking to the inside of the pouch.  Bathing You may shower or bathe with your  pouch on or off. Remember that your stoma may function during this time.  The materials you use to wash your stoma and the skin around it should be clean, but they do not need to be sterile.  Wearing Your Pouch During hot weather, or if you perspire a lot in general, wear a cover over your pouch. This may prevent a rash on your skin under the pouch. Pouch covers are sold at ostomy supply stores. Wear the pouch inside your underwear for better support. Watch your weight. Any gain or loss of 10 to 15 pounds or more can change the way your pouch fits.  Going Away From Home A collapsible cup (like those that come in travel kits) or a soft plastic squirt bottle with a pull-up top (like a travel bottle for shampoo) can be used for rinsing your pouch when you are away from home. Tilt the opening of the pouch at an upward angle when using a cup to rinse.  Carry wet wipes or extra tissues to use in public bathrooms.  Carry an extra pouching system with you at all times.  Never keep ostomy supplies in the glove compartment of your car. Extreme heat or cold can damage the skin barriers and adhesive wafers on the pouch.  When you travel, carry your ostomy supplies with you at all times. Keep them within easy reach. Do not pack ostomy supplies in baggage that will be checked or otherwise separated from you, because your baggage might be lost. If you're traveling out of the country, it is helpful to have a letter stating that you are carrying ostomy supplies as a medical necessity.  If you need ostomy supplies while traveling, look in the yellow pages of the telephone book under "Surgical Supplies." Or call the local ostomy organization to find out where supplies are available.  Do not let your ostomy supplies get low. Always order new pouches before you use the last one.  Reducing Odor Limit foods such as broccoli, cabbage, onions, fish, and  garlic in your diet to help reduce odor. Each time you empty  your pouch, carefully clean the opening of the pouch, both inside and outside, with toilet paper. Rinse your pouch 1 or 2 times daily after you empty it (see directions for emptying your pouch and going away from home). Add deodorant (such as Super Banish or Nullo) to your pouch. Use air deodorizers in your bathroom. Do not add aspirin to your pouch. Even though aspirin can help prevent odor, it could cause ulcers on your stoma.  When to call the doctor Call the doctor if you have any of the following symptoms: Purple, black, or white stoma Severe cramps lasting more than 6 hours Severe watery discharge from the stoma lasting more than 6 hours No output from the colostomy for 3 days Excessive bleeding from your stoma Swelling of your stoma to more than 1/2-inch larger than usual Pulling inward of your stoma below skin level Severe skin irritation or deep ulcers Bulging or other changes in your abdomen  When to call your ostomy nurse Call your ostomy/enterostomal therapy (WOCN) nurse if any of the following occurs: Frequent leaking of your pouching system Change in size or appearance of your stoma, causing discomfort or problems with your pouch Skin rash or rawness Weight gain or loss that causes problems with your pouch     FREQUENTLY ASKED QUESTIONS   Why haven't you met any of these folks who have an ostomy?  Well, maybe you have! You just did not recognize them because an ostomy doesn't show. It can be kept secret if you wish. Why, maybe some of your best friends, office associates or neighbors have an ostomy ... you never can tell. People facing ostomy surgery have many quality-of-life questions like: Will you bulge? Smell? Make noises? Will you feel waste leaving your body? Will you be a captive of the toilet? Will you starve? Be a social outcast? Get/stay married? Have babies? Easily bathe, go swimming, bend over?  OK, let's look at what you can expect:   Will you bulge?   Remember, without part of the intestine or bladder, and its contents, you should have a flatter tummy than before. You can expect to wear, with little exception, what you wore before surgery ... and this in-cludes tight clothing and bathing suits.   Will you smell?  Today, thanks to modern odor proof pouching systems, you can walk into an ostomy support group meeting and not smell anything that is foul or offensive. And, for those with an ileostomy or colostomy who are concerned about odor when emptying their pouch, there are in-pouch deodorants that can be used to eliminate any waste odors that may exist.   Will you make noises?  Everyone produces gas, especially if they are an air-swallower. But intestinal sounds that occur from time to time are no differ-ent than a gurgling tummy, and quite often your clothing will muffle any sounds.   Will you feel the waste discharges?  For those with a colostomy or ileostomy there might be a slight pressure when waste leaves your body, but understand that the intestines have no nerve endings, so there will be no unpleasant sensations. Those with a urostomy will probably be unaware of any kidney drainage.   Will you be a captive of the toilet?  Immediately post-op you will spend more time in the bathroom than you will after your body recovers from surgery. Every person is different, but on average those with an ileostomy or  urostomy may empty their pouches 4 to 6 times a day; a little  less if you have a colostomy. The average wear time between pouch system changes is 3 to 5 days and the changing process should take less than 30 minutes.   Will I need to be on a special diet? Most people return to their normal diet when they have recovered from surgery. Be sure to chew your food well, eat a well-balanced diet and drink plenty of fluids. If you experience problems with a certain food, wait a couple of weeks and try it again.  Will there be odor and  noises? Pouching systems are designed to be odor-proof or odor-resistant. There are deodorants that can be used in the pouch. Medications are also available to help reduce odor. Limit gas-producing foods and carbonated beverages. You will experience less gas and fewer noises as you heal from surgery.  How much time will it take to care for my ostomy? At first, you may spend a lot of time learning about your ostomy and how to take care of it. As you become more comfortable and skilled at changing the pouching system, it will take very little time to care for it.   Will I be able to return to work? People with ostomies can perform most jobs. As soon as you have healed from surgery, you should be able to return to work. Heavy lifting (more than 10 pounds) may be discouraged.   What about intimacy? Sexual relationships and intimacy are important and fulfilling aspects of your life. They should continue after ostomy surgery. Intimacy-related concerns should be discussed openly between you and your partner.   Can I wear regular clothing? You do not need to wear special clothing. Ostomy pouches are fairly flat and barely noticeable. Elastic undergarments will not hurt the stoma or prevent the ostomy from functioning.   Can I participate in sports? An ostomy should not limit your involvement in sports. Many people with ostomies are runners, skiers, swimmers or participate in other active lifestyles. Talk with your caregiver first before doing heavy physical activity.  Will you starve?  Not if you follow doctor's orders at each stage of your post-op adjustment. There is no such thing as an "ostomy diet". Some people with an ostomy will be able to eat and tolerate anything; others may find diffi-culty with some foods. Each person is an individual and must determine, by trial, what is best for them. A good practice for all is to drink plenty of water.   Will you be a social outcast?  Have you met anyone  who has an ostomy and is a social outcast? Why should you be the first? Only your attitude and self image will effect how you are treated. No confi-dent person is an Occupational psychologist.    PROFESSIONAL HELP   Resources are available if you need help or have questions about your ostomy.   Specially trained nurses called Wound, Ostomy Continence Nurses (WOCN) are available for consultation in most major medical centers.  Consider getting an ostomy consult at one of the outpatient ostomy clinics.  Your surgeon office or wound ostomy nurse can help set it up  The [employer] Ostomy Association (UOA) is a group made up of many local chapters throughout the [place]. These local groups hold meetings and provide support to prospective and existing ostomates. They sponsor educational events and have qualified visitors to make personal or telephone visits. Contact the UOA for the chapter nearest you  and for other educational publications.  More detailed information can be found in Colostomy Guide, a publication of the [employer] (UOA). Contact UOA at 1-720-261-8236 or visit their web site at https://arellano.com/. The website contains links to other sites, suppliers and resources.  Tree surgeon Start Services: Start at the website to enlist for support.  Your Wound Ostomy (WOCN) nurse may have started this process. https://www.hollister.com/en/securestart Secure Start services are designed to support people as they live their lives with an ostomy or neurogenic bladder. Enrolling is easy and at no cost to the patient. We realize that each person's needs and life journey are different. Through Secure Start services, we want to help people live their life, their way.   #################################   DRAIN CARE:   You have a closed bulb drain to help you heal.    A bulb drain is a small, plastic reservoir which creates a gentle suction. It is used to remove excess fluid from a surgical wound. The  color and amount of fluid will vary. Immediately after surgery, the fluid is bright red. It may gradually change to a yellow color. When the amount decreases to about 1 or 2 tablespoons (15 to 30 cc) per 24 hours, your caregiver will usually remove it.  JP Care  The Jackson-Pratt drainage system has flexible tubing attached to a soft, plastic bulb with a stopper. The drainage end of the tubing, which is flat and white, goes into your body through a small opening near your incision (surgical cut). A stitch holds the drainage end in place. The rest of the tube is outside your body, attached to the bulb. When the bulb is compressed with the stopper in place, it creates a vacuum. This causes a constant gentle suction, which helps draw out fluid that collects under your incision. The bulb should be compressed at all times, except when you are emptying the drainage.  How long you will have your Jackson-Pratt depends on your surgery and the amount of fluid is draining. This is different for everyone. The Jackson-Pratt is usually removed when the drainage is 30 mL or less over 24 hours. To keep track of how much drainage you're having, you will record the amount in a drainage log. It's important to bring the log with you to your follow-up appointments.  Caring for Your Drain at Home In order to care for your drain at home, you or your caregiver will do the following:  Empty the drain once a day and record the color and amount of drainage  Care for the area where the tubing enters your skin by washing with soap and water.  Milk the tubing to help move clots into the bulb.  Do this before you empty and measure your drainage. Look in the mirror at the tubing. This will help you see where your hands need to be. Pinch the tubing close to where it goes into your skin between your thumb and forefinger. With the thumb and forefinger of your other hand, pinch the tubing right below your other fingers. Keep your  fingers pinched and slide them down the tubing, pushing any clots down toward the bulb. You may want to use alcohol swabs to help you slide your fingers down the tubing. Repeat steps 3 and 4 as necessary to push clots from the tubing into the bulb. If you are not able to move a clot into the bulb, call your doctor's office. The fluid may leak around the insertion site if  a clot is blocking the drainage flow. If there is fluid in the bulb and no leakage at the insertion site, the drain is working.  How to Empty Your Jackson-Pratt and Record the Drainage You will need to empty your Jackson-Pratt every day  Gather the following supplies:  Measuring container your nurse gave you Jackson-Pratt Drainage Record  Pen or pencil  Instructions Clean an area to work on. Clean your hands thoroughly. Unplug the stopper on top of your Jackson-Pratt. This will cause the bulb to expand. Do not touch the inside of the stopper or the inner area of the opening on the bulb. Turn your Jackson-Pratt upside down, gently squeeze the bulb, and pour the drainage into the measuring container. Turn your Jackson-Pratt right side up. Squeeze the bulb until your fingers feel the palm of your hand. Keep squeezing the bulb while you replug the stopper. Make sure the bulb stays fully compressed to ensure constant, gentle suction.    Check the amount and color of drainage in the measuring container. The first couple days after surgery the fluid may be dark red. This is normal. As you heal the fluid may look pink or pale yellow. Record this amount and the color of drainage on your Jackson-Pratt Drainage Record. Flush the drainage down the toilet and rinse the measuring container with water.  Caring for the Insertion Site  Once you have emptied the drainage, clean your hands again. Check the area around the insertion site. Look for tenderness, swelling, or pus. If you have any of these, or if you have a temperature of 101  F (38.3 C) or higher, you may have an infection. Call your doctor's office.  Sometimes, the drain causes redness the size of a dime at your insertion site. This is normal. Your healthcare provider will tell you if you should place a bandage over the insertion site.  Wash drain site with soap & water (dilute hydrogen peroxide PRN) daily & replace clean dressing / tape    DAILY CARE Keep the bulb compressed at all times, except while emptying it. The compression creates suction.  Keep sites where the tubes enter the skin dry and covered with a light bandage (dressing).  Tape the tubes to your skin, 1 to 2 inches below the insertion sites, to keep from pulling on your stitches. Tubes are stitched in place and will not slip out.  Pin the bulb to your shirt (not to your pants) with a safety pin.  For the first few days after surgery, there usually is more fluid in the bulb. Empty the bulb whenever it becomes half full because the bulb does not create enough suction if it is too full. Include this amount in your 24 hour totals.  When the amount of drainage decreases, empty the bulb at the same time every day. Write down the amounts and the 24 hour totals. Your caregiver will want to know them. This helps your caregiver know when the tubes can be removed.  (We anticipate removing the drain in 1-3 weeks, depending on when the output is <76m a day for 2+ days) If there is drainage around the tube sites, change dressings and keep the area dry. If you see a clot in the tube, leave it alone. However, if the tube does not appear to be draining, let your caregiver know.  TO EMPTY THE BULB Open the stopper to release suction.  Holding the stopper out of the way, pour drainage into the measuring cup  that was sent home with you.  Measure and write down the amount. If there are 2 bulbs, note the amount of drainage from bulb 1 or bulb 2 and keep the totals separate. Your caregiver will want to know which tube is  draining more.  Compress the bulb by folding it in half.  Replace the stopper.  Check the tape that holds the tube to your skin, and pin the bulb to your shirt.  SEEK MEDICAL CARE IF: The drainage develops a bad odor.  You have an oral temperature above 102 F (38.9 C).  The amount of drainage from your wound suddenly increases or decreases.  You accidentally pull out your drain.  You have any other questions or concerns.  MAKE SURE YOU:  Understand these instructions.  Will watch your condition.  Will get help right away if you are not doing well or get worse.    Call our office if you have any questions about your drain. [phone]

## 2021-11-03 NOTE — Op Note (Signed)
Operative Note   DATE OF OPERATION: 11.17.22  LOCATION: [facility]-inpatient  SURGICAL DIVISION: Plastic Surgery  PREOPERATIVE DIAGNOSES:  1. Squamous cell carcinoma anus 2. History therapeutic radiation  POSTOPERATIVE DIAGNOSES:  same  PROCEDURE:  Vertical rectus abdominus myocutaneous flap to perineum  SURGEON: Irene Limbo MD MBA  ASSISTANT: none  ANESTHESIA:  General.   EBL: 300 ml for entire procedure  COMPLICATIONS: None immediate.   INDICATIONS FOR PROCEDURE:  The patient, Beth [name], is a 50 y.o. female born on 1971/05/06, is here for reconstruction perinuem following abdominoperineal resection, history prior radiation.   FINDINGS: Bright dermal bleeding of skin paddle noted at end of flap inset. Closure anterior rectus fascia completed primarily by Dr. Johney Maine.  DESCRIPTION OF PROCEDURE:  The patient's operative site was marked with the patient in the preoperative area. The patient was taken to the operating room. SCDs were placed and IV antibiotics were given. Foley catheter placed. Doppler used to identify medial and lateral row of perforators through right rectus abdominus muscle. Vertically oriented skin paddle designed over supraumbilical abdomen to include the identified perforators.The patient's operative site was prepped and draped in a sterile fashion. A time out was performed and all information was confirmed to be correct. Please refer to Dr. Johney Maine note for description of robotic assisted resection. Following completion of this, incision made sharply around skin paddle and carried to anterior right rectus fascia. Anterior sheath incised beneath skin paddle. Rectus abdominus then freed from both anterior and posterior sheath at level of costal margin and muscle divided with GIA 75 stapler. Dissection then proceeded caudally; segmental nerves and blood vessels controlled with clips. Skin incision continued from skin paddle inferiorly over right paramedian  hemiabdomen. Inferior epigastric pedicle identified and noted to have palpable and visible pulse. Dr. Johney Maine performed peritoneal incision, and the flap was then rotated over pubic bone into pelvis without tension. The skin paddle of flap was inset to perineum using 2-0 PDS interrupted to approximate superficial fascia. The perineal portion of skin paddle was inset with interrupted 3-0 vicryl in dermis and interrupted 4-0 vicryl for skin closure. Closure of rectus fascia and soft tissue completed by Dr. Johney Maine. Following primary closure of anterior rectus fascia, a 15 Fr JP drain was placed percutaneously in subfascial plane. Drains were secured to skin.  Dry dressing applied to perinuem.  The patient was allowed to wake from anesthesia, extubated and taken to the recovery room in satisfactory condition.   SPECIMENS: none  DRAINS: 15 Fr JP in right subcutaneous and subfascial abdomen

## 2021-11-03 NOTE — Anesthesia Procedure Notes (Signed)
Procedure Name: Intubation Date/Time: 11/03/2021 7:35 AM Performed by: Claudia Desanctis, CRNA Pre-anesthesia Checklist: Patient identified, Emergency Drugs available, Suction available and Patient being monitored Patient Re-evaluated:Patient Re-evaluated prior to induction Oxygen Delivery Method: Circle system utilized Preoxygenation: Pre-oxygenation with 100% oxygen Induction Type: IV induction Ventilation: Mask ventilation without difficulty Laryngoscope Size: 2 and Miller Grade View: Grade I Tube type: Oral Tube size: 7.0 mm Number of attempts: 1 Airway Equipment and Method: Stylet Placement Confirmation: ETT inserted through vocal cords under direct vision, positive ETCO2 and breath sounds checked- equal and bilateral Tube secured with: Tape Dental Injury: Teeth and Oropharynx as per pre-operative assessment

## 2021-11-03 NOTE — Transfer of Care (Signed)
Immediate Anesthesia Transfer of Care Note  Patient: Annisa Mazzarella Macom[place]  Procedure(s) Performed: XI ROBOT ABDOMINOPERINEAL RESECTION END COLOSTOMY CYSTOSCOPY with FIREFLY INJECTION VERTICAL RECTUS ABDOMINIS MYOCUTANEOUS TO PERINEUM  Patient Location: PACU  Anesthesia Type:General  Level of Consciousness: awake, alert , oriented and patient cooperative  Airway & Oxygen Therapy: Patient Spontanous Breathing and Patient connected to face mask  Post-op Assessment: Report given to RN and Post -op Vital signs reviewed and stable  Post vital signs: Reviewed and stable  Last Vitals:  Vitals Value Taken Time  BP 154/92 11/03/21 1300  Temp    Pulse 81 11/03/21 1301  Resp 10 11/03/21 1301  SpO2 100 % 11/03/21 1301  Vitals shown include unvalidated device data.  Last Pain:  Vitals:   11/03/21 0544  TempSrc:   PainSc: 0-No pain      Patients Stated Pain Goal: 4 (30/16/01 0932)  Complications: No notable events documented.

## 2021-11-04 ENCOUNTER — Encounter (HOSPITAL_COMMUNITY): Payer: Self-pay | Admitting: Surgery

## 2021-11-04 LAB — CBC
HCT: 30.2 % — ABNORMAL LOW (ref 36.0–46.0)
Hemoglobin: 9.9 g/dL — ABNORMAL LOW (ref 12.0–15.0)
MCH: 34.5 pg — ABNORMAL HIGH (ref 26.0–34.0)
MCHC: 32.8 g/dL (ref 30.0–36.0)
MCV: 105.2 fL — ABNORMAL HIGH (ref 80.0–100.0)
Platelets: 141 10*3/uL — ABNORMAL LOW (ref 150–400)
RBC: 2.87 MIL/uL — ABNORMAL LOW (ref 3.87–5.11)
RDW: 13.2 % (ref 11.5–15.5)
WBC: 10.9 10*3/uL — ABNORMAL HIGH (ref 4.0–10.5)
nRBC: 0 % (ref 0.0–0.2)

## 2021-11-04 LAB — BASIC METABOLIC PANEL
Anion gap: 7 (ref 5–15)
BUN: 10 mg/dL (ref 6–20)
CO2: 25 mmol/L (ref 22–32)
Calcium: 8.9 mg/dL (ref 8.9–10.3)
Chloride: 106 mmol/L (ref 98–111)
Creatinine, Ser: 0.66 mg/dL (ref 0.44–1.00)
GFR, Estimated: >60 mL/min (ref >60)
Glucose, Bld: 118 mg/dL — ABNORMAL HIGH (ref 70–99)
Potassium: 3.8 mmol/L (ref 3.5–5.1)
Sodium: 138 mmol/L (ref 135–145)

## 2021-11-04 LAB — URINE CULTURE: Culture: NO GROWTH

## 2021-11-04 LAB — MAGNESIUM: Magnesium: 1.7 mg/dL (ref 1.7–2.4)

## 2021-11-04 NOTE — Consult Note (Signed)
[facility] Nurse ostomy consult note Pt had colostomy surgery performed yesterday.  Stoma type/location: Stoma is red and viable, slightly above skin level, 1 3/4 inches with edema Peristomal assessment: intact skin surrounding with post-op Honeycomb dressing Output: scant amt pink liquid, no stool or flatus Ostomy pouching: 1pc. Education provided:  Husband at the bedside for teaching session.  Applied barrier ring and one piece flexible convex pouch.  Pt was able to open and close velcro to empty.  Reviewed pouching routines and ordering supplies.  Educational materials left at the bedside and supplies ordered to the room Enrolled patient in [place] program: Yes [facility] team will perform another teaching session on Mon. Julien Girt MSN, RN, [place], [place], [place]

## 2021-11-04 NOTE — Progress Notes (Signed)
Plastic Surgery  POD#1 APR, VRAM to perineum  Awake reports sore some nausea after walking resolved Foley out this am  Temp:  [97.8 F (36.6 C)-98.8 F (37.1 C)] 98.2 F (36.8 C) (11/18 0437) Pulse Rate:  [70-101] 70 (11/18 0437) Resp:  [12-18] 16 (11/18 0437) BP: (102-154)/(64-92) 110/73 (11/18 0437) SpO2:  [95 %-100 %] 100 % (11/18 0437)   PO 240 JP 19 Fr 30 ml 15 Fr 170 ml  PE JPs watery serosanguinous Abd soft distended dressing dry intact ostomy pink Perineum: flap soft pink without hyperemia small drainage on dressings  A/P Ambulate IS Dry dressing to perineum Ok to shower from Plastics standpoint  Irene Limbo, MD [facility] Plastic & Reconstructive Surgery

## 2021-11-04 NOTE — Anesthesia Postprocedure Evaluation (Signed)
Anesthesia Post Note  Patient: Beth [name]  Procedure(s) Performed: XI ROBOT ABDOMINOPERINEAL RESECTION END COLOSTOMY CYSTOSCOPY with FIREFLY INJECTION VERTICAL RECTUS ABDOMINIS MYOCUTANEOUS TO PERINEUM     Patient location during evaluation: PACU Anesthesia Type: General Level of consciousness: awake and alert Pain management: pain level controlled Vital Signs Assessment: post-procedure vital signs reviewed and stable Respiratory status: spontaneous breathing, nonlabored ventilation and respiratory function stable Cardiovascular status: blood pressure returned to baseline and stable Postop Assessment: no apparent nausea or vomiting Anesthetic complications: no   No notable events documented.  Last Vitals:  Vitals:   11/04/21 1315 11/04/21 1721  BP: 120/78 116/77  Pulse: 83 86  Resp: 14 15  Temp: 36.8 C 37.3 C  SpO2: 98% 97%    Last Pain:  Vitals:   11/04/21 1721  TempSrc: Oral  PainSc:    Pain Goal: Patients Stated Pain Goal: 3 (11/04/21 1700)                 Merlinda Frederick

## 2021-11-04 NOTE — Progress Notes (Signed)
Beth [name] [id number] 1971/01/29  CARE TEAM:  PCP: Caryl Bis, MD  Outpatient Care Team: Patient Care Team: Caryl Bis, MD as PCP - General (Unknown Physician Specialty) Michael Boston, MD as Consulting Physician (General Surgery) Wonda Horner, MD as Consulting Physician (Gastroenterology) [name], MD as Referring Physician (Obstetrics and Gynecology) Virgel Manifold, MD as Consulting Physician (Gastroenterology) Ladell Pier, MD as Consulting Physician (Oncology)  Inpatient Treatment Team: Treatment Team: Attending Provider: Michael Boston, MD; Registered Nurse: Marney Doctor, RN; Technician: Samara Snide, NT; [place] Nurse: Illene Regulus, RN   Problem List:   Principal Problem:   Squamous cell carcinoma of anal canal ([place])   1 Day Post-Op    11/03/2021  POST-OPERATIVE DIAGNOSIS:  RECURRENT ANAL SQUAMOUS CELL CANCER   PROCEDURE:   XI ROBOT ABDOMINOPERINEAL RESECTION END COLOSTOMY   SURGEON:  [name], MD  OR FINDINGS:    Dense fibrotic tumor involving the anal canal and distal rectum posterior 40% of the circumference.  Closest margin right lateral pelvic floor/levator.  Remaining right lateral pelvic floor margin negative on frozen.   End descending colostomy left lower quadrant.  Right vertical rectus abdominis donor site to perineum per Dr. Iran Planas.  See her separate OR note   Drains as noted above    ######################  PREOPERATIVE DIAGNOSES:  1. Squamous cell carcinoma anus 2. History therapeutic radiation   POSTOPERATIVE DIAGNOSES:  same   PROCEDURE:  Vertical rectus abdominus myocutaneous flap to perineum   SURGEON: Irene Limbo MD MBA  FINDINGS: Bright dermal bleeding of skin paddle noted at end of flap inset. Closure anterior rectus fascia completed primarily by Dr. Johney Maine.  Assessment  Doing relatively well so far  [facility] Stay = 1 days)  Plan: -ERAS protocol -Nausea gone.  Advance  diet gradually. -Colostomy care & teach -Foley down.  I&O cath as needed.  Try to avoid urine wakes with VRAM flap on perineum -pain control -drain care.  Expect them to be in place for 7-14 days minimum -Follow-up on pathology -VTE prophylaxis- SCDs, etc -mobilize as tolerated to help recovery  Disposition:  Disposition:  The patient is from: Home  Anticipate discharge to:  Home with Home Health  Anticipated Date of Discharge is:  November 22,2022    Barriers to discharge:  Pending Clinical improvement (more likely than not)  Patient currently is NOT MEDICALLY STABLE for discharge from the hospital from a surgery standpoint.      30 minutes spent in review, evaluation, examination, counseling, and coordination of care.   I have reviewed this patient's available data, including medical history, events of note, physical examination and test results as part of my evaluation.  A significant portion of that time was spent in counseling.  Care during the described time interval was provided by me.  11/04/2021    Subjective: (Chief complaint)  Nauseated last night but better now.  Already walked in hallway.  Pain mostly controlled  Objective:  Vital signs:  Vitals:   11/03/21 1917 11/03/21 2020 11/04/21 0137 11/04/21 0437  BP: 118/76 119/76 102/64 110/73  Pulse: 97 93 82 70  Resp: 18 18 16 16   Temp: 98 F (36.7 C) 98 F (36.7 C) 98.8 F (37.1 C) 98.2 F (36.8 C)  TempSrc: Oral Oral Oral Oral  SpO2: 100% 99% 98% 100%  Weight:      Height:           Intake/Output   Yesterday:  11/17 0701 -  11/18 0700 In: 5302.2 [P.O.:240; I.V.:4312.2; IV Piggyback:750] Out: 2370 [Urine:1870; Drains:200; Blood:300] This shift:  Total I/O In: 632.2 [P.O.:120; I.V.:512.2] Out: 3016 [Urine:1100; Drains:95]  Bowel function:  Flatus: No  BM:  No  Drain: Serosanguinous   Physical Exam:  General: Pt awake/alert in no acute distress Eyes: PERRL, normal EOM.  Sclera  clear.  No icterus Neuro: CN II-XII intact w/o focal sensory/motor deficits. Lymph: No head/neck/groin lymphadenopathy Psych:  No delerium/psychosis/paranoia.  Oriented x 4 HENT: Normocephalic, Mucus membranes moist.  No thrush Neck: Supple, No tracheal deviation.  No obvious thyromegaly Chest: No pain to chest wall compression.  Good respiratory excursion.  No audible wheezing CV:  Pulses intact.  Regular rhythm.  No major extremity edema MS: Normal AROM mjr joints.  No obvious deformity  Abdomen: Soft.  Nondistended.  Mildly tender at incisions only.  No evidence of peritonitis.  No incarcerated hernias. Colostomy pink with minimal edema.  No gas or stool in bag  GU/perineum -per Dr. Iran Planas. Ext:   No deformity.  No mjr edema.  No cyanosis Skin: No petechiae / purpurea.  No major sores.  Warm and dry    Results:   Cultures: Recent Results (from the past 720 hour(s))  SARS Coronavirus 2 (TAT 6-24 hrs)     Status: None   Collection Time: 11/01/21 12:00 AM  Result Value Ref Range Status   SARS Coronavirus 2 RESULT: NEGATIVE  Final    Comment: RESULT: NEGATIVESARS-CoV-2 INTERPRETATION:A NEGATIVE  test result means that SARS-CoV-2 RNA was not present in the specimen above the limit of detection of this test. This does not preclude a possible SARS-CoV-2 infection and should not be used as the  sole basis for patient management decisions. Negative results must be combined with clinical observations, patient history, and epidemiological information. Optimum specimen types and timing for peak viral levels during infections caused by SARS-CoV-2  have not been determined. Collection of multiple specimens or types of specimens may be necessary to detect virus. Improper specimen collection and handling, sequence variability under primers/probes, or organism present below the limit of detection may  lead to false negative results. Positive and negative predictive values of testing are highly  dependent on prevalence. False negative test results are more likely when prevalence of disease is high.The expected result is NEGATIVE.Fact S heet for  Healthcare Providers: LocalChronicle.no Sheet for Patients: SalonLookup.es Reference Range - Negative     Labs: Results for orders placed or performed during the hospital encounter of 11/03/21 (from the past 48 hour(s))  Type and screen [place]     Status: None   Collection Time: 11/03/21  6:19 AM  Result Value Ref Range   ABO/RH(D) O POS    Antibody Screen NEG    Sample Expiration      11/06/2021,2359 Performed at [facility], [place] [address] [place], [place] [address]   ABO/Rh     Status: None   Collection Time: 11/03/21  6:21 AM  Result Value Ref Range   ABO/RH(D)      O POS Performed at [facility], [place] [address] [place], [place] [address]   Basic metabolic panel     Status: Abnormal   Collection Time: 11/04/21  3:46 AM  Result Value Ref Range   Sodium 138 135 - 145 mmol/L   Potassium 3.8 3.5 - 5.1 mmol/L   Chloride 106 98 - 111 mmol/L   CO2 25 22 - 32 mmol/L   Glucose,  Bld 118 (H) 70 - 99 mg/dL    Comment: Glucose reference range applies only to samples taken after fasting for at least 8 hours.   BUN 10 6 - 20 mg/dL   Creatinine, Ser 0.66 0.44 - 1.00 mg/dL   Calcium 8.9 8.9 - 10.3 mg/dL   GFR, Estimated >60 >60 mL/min    Comment: (NOTE) Calculated using the CKD-EPI Creatinine Equation (2021)    Anion gap 7 5 - 15    Comment: Performed at [facility], [place] [address], [place], [place] [address]  CBC     Status: Abnormal   Collection Time: 11/04/21  3:46 AM  Result Value Ref Range   WBC 10.9 (H) 4.0 - 10.5 K/uL   RBC 2.87 (L) 3.87 - 5.11 MIL/uL   Hemoglobin 9.9 (L) 12.0 - 15.0 g/dL   HCT 30.2 (L) 36.0 - 46.0 %   MCV 105.2 (H) 80.0 - 100.0 fL   MCH 34.5 (H) 26.0 - 34.0 pg    MCHC 32.8 30.0 - 36.0 g/dL   RDW 13.2 11.5 - 15.5 %   Platelets 141 (L) 150 - 400 K/uL   nRBC 0.0 0.0 - 0.2 %    Comment: Performed at [facility], [place] [address], [place], [place] [address]  Magnesium     Status: None   Collection Time: 11/04/21  3:46 AM  Result Value Ref Range   Magnesium 1.7 1.7 - 2.4 mg/dL    Comment: Performed at [facility], [place] [address] [place], [place] [address]    Imaging / Studies: No results found.  Medications / Allergies: per chart  Antibiotics: Anti-infectives (From admission, onward)    Start     Dose/Rate Route Frequency Ordered Stop   11/03/21 2000  cefoTEtan (CEFOTAN) 2 g in sodium chloride 0.9 % 100 mL IVPB        2 g 200 mL/hr over 30 Minutes Intravenous Every 12 hours 11/03/21 1746 11/03/21 2241   11/03/21 1400  neomycin (MYCIFRADIN) tablet 1,000 mg  Status:  Discontinued       See Hyperspace for full Linked Orders Report.   1,000 mg Oral 3 times per day 11/03/21 0512 11/03/21 0520   11/03/21 1400  metroNIDAZOLE (FLAGYL) tablet 1,000 mg  Status:  Discontinued       See Hyperspace for full Linked Orders Report.   1,000 mg Oral 3 times per day 11/03/21 0512 11/03/21 0520   11/03/21 0600  cefoTEtan (CEFOTAN) 2 g in sodium chloride 0.9 % 100 mL IVPB        2 g 200 mL/hr over 30 Minutes Intravenous On call to O.R. 11/03/21 0512 11/03/21 0739         Note: Portions of this report may have been transcribed using voice recognition software. Every effort was made to ensure accuracy; however, inadvertent computerized transcription errors may be present.   Any transcriptional errors that result from this process are unintentional.    [name], MD, FACS, MASCRS Esophageal, Gastrointestinal & Colorectal Surgery Robotic and Minimally Invasive Surgery  [facility], [place]  [place]. [address] [place], [place] [address] [phone] Fax [phone] Main  CONTACT INFORMATION:  Weekday (9AM-5PM): Call [facility] main office at [phone]  Weeknight (5PM-9AM) or Weekend/Holiday: Check [email or web address] (password " [email or web address]") for General Surgery [facility] coverage  (Please, do not use SecureChat as it is not reliable communication to operating surgeons for immediate  patient care)      11/04/2021  6:55 AM

## 2021-11-05 LAB — HEMOGLOBIN: Hemoglobin: 11.1 g/dL — ABNORMAL LOW (ref 12.0–15.0)

## 2021-11-05 LAB — POTASSIUM: Potassium: 3.8 mmol/L (ref 3.5–5.1)

## 2021-11-05 LAB — CREATININE, SERUM
Creatinine, Ser: 0.5 mg/dL (ref 0.44–1.00)
GFR, Estimated: >60 mL/min (ref >60)

## 2021-11-05 NOTE — Progress Notes (Signed)
Plastic Surgery  POD#2 APR, VRAM to perineum  Tolerating liquids, had eggs this am  Temp:  [98.2 F (36.8 C)-99.3 F (37.4 C)] 98.8 F (37.1 C) (11/19 0550) Pulse Rate:  [83-97] 92 (11/19 0550) Resp:  [14-18] 18 (11/19 0550) BP: (116-127)/(77-81) 123/80 (11/19 0550) SpO2:  [97 %-98 %] 97 % (11/19 0550) Weight:  [70.6 kg] 70.6 kg (11/19 0500)   PO 1200 Ostomy 40 ml JP 19 Fr 30 ml 15 Fr 385 ml  PE Pelvic 19 Fr JP watery serosanguinous subfascial 15 Fr JP serous small amount  Abd soft - RUQ superior to incision fullness, soft, feel this is normal soft tissue prominence result of skin paddle harvest caudal to this area  Perineum: flap soft pink without hyperemia no drainage on dressings  A/P Doing well #2 drain is 19 Fr and is the high output drain- this is currently being recorded under "15 Fr drain"- reviewed with staff to correct Ambulate IS Dry dressing to perineum Ok to shower from Wilder, MD [facility] Plastic & Reconstructive Surgery

## 2021-11-05 NOTE — Progress Notes (Signed)
2 Days Post-Op   Subjective/Chief Complaint: A little bloated, tired, has been up, voiding, tol some diet   Objective: Vital signs in last 24 hours: Temp:  [98.2 F (36.8 C)-99.3 F (37.4 C)] 98.8 F (37.1 C) (11/19 0550) Pulse Rate:  [83-97] 92 (11/19 0550) Resp:  [14-18] 18 (11/19 0550) BP: (116-127)/(77-81) 123/80 (11/19 0550) SpO2:  [97 %-98 %] 97 % (11/19 0550) Weight:  [70.6 kg] 70.6 kg (11/19 0500)    Intake/Output from previous day: 11/18 0701 - 11/19 0700 In: 1200 [P.O.:1200] Out: 1905 [Urine:1450; Drains:415; Stool:40] Intake/Output this shift: Total I/O In: 130 [P.O.:130] Out: -   General fatigued nad Cv regular Pulm effort normal Ab soft no real output yet, approp tender jps serosang  Lab Results:  Recent Labs    11/04/21 0346 11/05/21 0413  WBC 10.9*  --   HGB 9.9* 11.1*  HCT 30.2*  --   PLT 141*  --    BMET Recent Labs    11/04/21 0346 11/05/21 0413  NA 138  --   K 3.8 3.8  CL 106  --   CO2 25  --   GLUCOSE 118*  --   BUN 10  --   CREATININE 0.66 0.50  CALCIUM 8.9  --    PT/INR No results for input(s): LABPROT, INR in the last 72 hours. ABG No results for input(s): PHART, HCO3 in the last 72 hours.  Invalid input(s): PCO2, PO2  Studies/Results: No results found.  Anti-infectives: Anti-infectives (From admission, onward)    Start     Dose/Rate Route Frequency Ordered Stop   11/03/21 2000  cefoTEtan (CEFOTAN) 2 g in sodium chloride 0.9 % 100 mL IVPB        2 g 200 mL/hr over 30 Minutes Intravenous Every 12 hours 11/03/21 1746 11/03/21 2241   11/03/21 1400  neomycin (MYCIFRADIN) tablet 1,000 mg  Status:  Discontinued       See Hyperspace for full Linked Orders Report.   1,000 mg Oral 3 times per day 11/03/21 0512 11/03/21 0520   11/03/21 1400  metroNIDAZOLE (FLAGYL) tablet 1,000 mg  Status:  Discontinued       See Hyperspace for full Linked Orders Report.   1,000 mg Oral 3 times per day 11/03/21 0512 11/03/21 0520   11/03/21  0600  cefoTEtan (CEFOTAN) 2 g in sodium chloride 0.9 % 100 mL IVPB        2 g 200 mL/hr over 30 Minutes Intravenous On call to O.R. 11/03/21 0512 11/03/21 0739       Assessment/Plan: POD 2 APR, vram - Gross -continue diet  -colostomy care -pulm toilet, oob -area of swelling not concerning now  I think this is normal after vram -monitor drains Scds, lovenox -labs look good Rolm Bookbinder 11/05/2021

## 2021-11-06 NOTE — Progress Notes (Signed)
Plastic Surgery  POD#3 APR, VRAM to perineum  Reports gas in ostomy bag, had walk this am  Temp:  [98.1 F (36.7 C)-99 F (37.2 C)] 98.4 F (36.9 C) (11/20 0601) Pulse Rate:  [89-99] 89 (11/20 0601) Resp:  [14-18] 16 (11/20 0601) BP: (118-145)/(74-98) 123/74 (11/20 0601) SpO2:  [98 %-100 %] 98 % (11/20 0601) Weight:  [70.7 kg] 70.7 kg (11/20 0500)   PO 910 Ostomy 0 ml JP 19 Fr 275 ml 15 Fr 25 ml  PE Pelvic 19 Fr JP watery serosanguinous subfascial 15 Fr JP serous small amount  Abd soft  dressing in place incision dry intact  Perineum: flap soft pink without hyperemia no drainage  A/P Doing well Likely remove 15 Fr JP in next 1-2 days Ambulate IS Dry dressing to perineum   Irene Limbo, MD [facility] Plastic & Reconstructive Surgery

## 2021-11-06 NOTE — Progress Notes (Signed)
3 Days Post-Op   Subjective/Chief Complaint: Doing fine, has some air in bag, ambulating, voiding, pain under fair control,no n/v   Objective: Vital signs in last 24 hours: Temp:  [98.1 F (36.7 C)-99 F (37.2 C)] 98.4 F (36.9 C) (11/20 0601) Pulse Rate:  [89-99] 89 (11/20 0601) Resp:  [14-18] 16 (11/20 0601) BP: (118-145)/(74-98) 123/74 (11/20 0601) SpO2:  [98 %-100 %] 98 % (11/20 0601) Weight:  [70.7 kg] 70.7 kg (11/20 0500)    Intake/Output from previous day: 11/19 0701 - 11/20 0700 In: 910 [P.O.:910] Out: 1550 [Urine:1250; Drains:300] Intake/Output this shift: No intake/output data recorded.  General fatigued nad Cv regular Pulm effort normal Ab soft no real output yet, approp tender jps serosang  Lab Results:  Recent Labs    11/04/21 0346 11/05/21 0413  WBC 10.9*  --   HGB 9.9* 11.1*  HCT 30.2*  --   PLT 141*  --    BMET Recent Labs    11/04/21 0346 11/05/21 0413  NA 138  --   K 3.8 3.8  CL 106  --   CO2 25  --   GLUCOSE 118*  --   BUN 10  --   CREATININE 0.66 0.50  CALCIUM 8.9  --    PT/INR No results for input(s): LABPROT, INR in the last 72 hours. ABG No results for input(s): PHART, HCO3 in the last 72 hours.  Invalid input(s): PCO2, PO2  Studies/Results: No results found.  Anti-infectives: Anti-infectives (From admission, onward)    Start     Dose/Rate Route Frequency Ordered Stop   11/03/21 2000  cefoTEtan (CEFOTAN) 2 g in sodium chloride 0.9 % 100 mL IVPB        2 g 200 mL/hr over 30 Minutes Intravenous Every 12 hours 11/03/21 1746 11/03/21 2241   11/03/21 1400  neomycin (MYCIFRADIN) tablet 1,000 mg  Status:  Discontinued       See Hyperspace for full Linked Orders Report.   1,000 mg Oral 3 times per day 11/03/21 0512 11/03/21 0520   11/03/21 1400  metroNIDAZOLE (FLAGYL) tablet 1,000 mg  Status:  Discontinued       See Hyperspace for full Linked Orders Report.   1,000 mg Oral 3 times per day 11/03/21 0512 11/03/21 0520    11/03/21 0600  cefoTEtan (CEFOTAN) 2 g in sodium chloride 0.9 % 100 mL IVPB        2 g 200 mL/hr over 30 Minutes Intravenous On call to O.R. 11/03/21 0512 11/03/21 0739       Assessment/Plan: POD 3 APR, vram - Gross -continue diet  -colostomy care -pulm toilet, oob -area of swelling not concerning now  I think this is normal after vram -monitor drains Scds, lovenox -check labs in am  Rolm Bookbinder 11/06/2021

## 2021-11-07 LAB — CBC
HCT: 33.4 % — ABNORMAL LOW (ref 36.0–46.0)
Hemoglobin: 10.9 g/dL — ABNORMAL LOW (ref 12.0–15.0)
MCH: 33.7 pg (ref 26.0–34.0)
MCHC: 32.6 g/dL (ref 30.0–36.0)
MCV: 103.4 fL — ABNORMAL HIGH (ref 80.0–100.0)
Platelets: 162 10*3/uL (ref 150–400)
RBC: 3.23 MIL/uL — ABNORMAL LOW (ref 3.87–5.11)
RDW: 12.9 % (ref 11.5–15.5)
WBC: 7.8 10*3/uL (ref 4.0–10.5)
nRBC: 0 % (ref 0.0–0.2)

## 2021-11-07 LAB — BASIC METABOLIC PANEL
Anion gap: 6 (ref 5–15)
BUN: 7 mg/dL (ref 6–20)
CO2: 26 mmol/L (ref 22–32)
Calcium: 9 mg/dL (ref 8.9–10.3)
Chloride: 103 mmol/L (ref 98–111)
Creatinine, Ser: 0.41 mg/dL — ABNORMAL LOW (ref 0.44–1.00)
GFR, Estimated: >60 mL/min (ref >60)
Glucose, Bld: 119 mg/dL — ABNORMAL HIGH (ref 70–99)
Potassium: 3.8 mmol/L (ref 3.5–5.1)
Sodium: 135 mmol/L (ref 135–145)

## 2021-11-07 NOTE — Plan of Care (Signed)

## 2021-11-07 NOTE — Consult Note (Signed)
[place] Nurse ostomy follow up Patient receiving care in [place] 1310. Spouse not present. Patient requested she perform the ostomy pouch change process without spouse present. Stoma type/location: left upper quadrant colostomy Stomal assessment/size: 1 3/8 inches, red, moist, sutures intact Peristomal assessment: intact Treatment options for stomal/peristomal skin: barrier ring Output: thick liquid brown  Ostomy pouching: 1pc. Convex. Three additional pouches ordered by [place] for patient's discharge. Education provided: Patient removed existing pouch, participated in cutting new skin barrier, sizing the barrier ring, and placing the new pouch. Patient emptied existing pouch prior to removal and application. Enrolled patient in [place] Start Discharge program: Yes, stated box had arrived at her home. Val Riles, RN, MSN, CWOCN, CNS-BC, pager [phone]

## 2021-11-07 NOTE — Op Note (Signed)
Preoperative diagnosis:  Pelvic Abscess   Diverticulitis Postoperative diagnosis:  Same   Procedure: Cystoscopy Instillation of ureteral firefly constrast   Surgeon: Ardis Hughs, MD   Anesthesia: General   Complications: None   Intraoperative findings: turbid urine, normal appearing bladder   EBL: Minimal   Specimens: urine culture   Indication:  Beth [name]  is a 50 y.o.  patient with anal cancer s/p radiation.  Dr. Johney Maine requested temporary ureteral stents to help facilitate the dissection.  After reviewing the management options for treatment, she elected to proceed with the above surgical procedure(s). We have discussed the potential benefits and risks of the procedure, side effects of the proposed treatment, the likelihood of the patient achieving the goals of the procedure, and any potential problems that might occur during the procedure or recuperation. Informed consent has been obtained.   Description of procedure:   The patient was taken to the operating room and general anesthesia was induced.  The patient was placed in the dorsal lithotomy position, prepped and draped in the usual sterile fashion, and preoperative antibiotics were administered. A preoperative time-out was performed.    A 21 French 30 degree cystoscope was gently passed through the patient's urethra into the bladder.  The bladder was subsequently emptied and then filled slowly up performing a 360 degrees cystoscopic evaluation.  This demonstrated orthotopic ureteral orifices, normal bladder mucosa with no abnormalities.   I then advanced a 5 [place] open-ended ureteral catheter into the patient's left ureteral orifice and  I then advanced the catheter up into the proximal ureter and then slowly pulled back and injected 7.86ml of the firefly contrast.  Subsequently turned my attention to the patient's right ureteral orifice and performed a similar task.   I then  placed a 16 [place] Foley.    The  surgery was then turned over to Dr. Johney Maine for facilitation of the remainder of the case.

## 2021-11-07 NOTE — Progress Notes (Signed)
Plastic Surgery  POD#4 APR, VRAM to perineum  Reports gas in ostomy bag, had walk this am  Temp:  [98.1 F (36.7 C)-98.5 F (36.9 C)] 98.5 F (36.9 C) (11/21 0457) Pulse Rate:  [85-107] 101 (11/21 0457) Resp:  [16-18] 18 (11/21 0457) BP: (134-143)/(80-98) 134/89 (11/21 0457) SpO2:  [99 %-100 %] 99 % (11/21 0457) Weight:  [70.9 kg] 70.9 kg (11/21 0500)   PO 1080  Ostomy 200 ml JP 19 Fr 250  ml 15 Fr 30 ml  PE Pelvic 19 Fr JP watery serosanguinous subfascial 15 Fr JP serous small amount  Abd soft  dressing off and incision dry intact  Perineum: flap soft pink without hyperemia no drainage  A/P Home today D/c 15 Fr JP prior to d/c Dry dressing to perineum F/u 1 week with me   Irene Limbo, MD [facility] Plastic & Reconstructive Surgery

## 2021-11-07 NOTE — TOC Transition Note (Signed)
Transition of Care [facility]) - CM/SW Discharge Note   Patient Details  Name: Beth [name] MRN: [id number] Date of Birth: [birth year]  [facility]) CM/SW Contact:  Trish Mage, LCSW Phone Number: 11/07/2021, 10:17 AM   Clinical Narrative:   Spoke with patient about home situation and readiness for d/c.  Mother was present.  Ms Harms lives in [place] with her husband, who works daily, but works only a mile from home and can come to help if needed during the day.  Mother also lives in the area and plans to come by daily post d/c.  Ms Stolarz has not yet changed her colostomy, states she is to be walked through the process later today with husband present.  Also unsure about drains, is waiting to be trained on that as well.  Cindie with [facility] can provide RN [facility] services, but not until Friday due to holiday week.  Am reaching out to other [facility] providers to see if she can be seen sooner. TOC will continue to follow during the course of hospitalization.     Final next level of care: [place] Barriers to Discharge: No Barriers Identified   Patient Goals and CMS Choice        Discharge Placement                       Discharge Plan and Services                                     Social Determinants of Health (SDOH) Interventions     Readmission Risk Interventions No flowsheet data found.

## 2021-11-07 NOTE — Discharge Summary (Signed)
Physician Discharge Summary    Patient ID: Beth [name] MRN: [id number] DOB/AGE: 1971-08-29  50 y.o.  Patient Care Team: Caryl Bis, MD as PCP - General (Unknown Physician Specialty) Michael Boston, MD as Consulting Physician (General Surgery) Wonda Horner, MD as Consulting Physician (Gastroenterology) [name], MD as Referring Physician (Obstetrics and Gynecology) Virgel Manifold, MD as Consulting Physician (Gastroenterology) Ladell Pier, MD as Consulting Physician (Oncology)  Admit date: 11/03/2021  Discharge date: 11/07/2021  Hospital Stay = 4 days    Discharge Diagnoses:  Principal Problem:   Squamous cell carcinoma of anal canal ([place])   4 Days Post-Op  11/03/2021  11/03/2021   POST-OPERATIVE DIAGNOSIS:  RECURRENT ANAL SQUAMOUS CELL CANCER   PROCEDURE:   XI ROBOT ABDOMINOPERINEAL RESECTION END COLOSTOMY   SURGEON:  [name], MD   OR FINDINGS:    Dense fibrotic tumor involving the anal canal and distal rectum posterior 40% of the circumference.  Closest margin right lateral pelvic floor/levator.  Remaining right lateral pelvic floor margin negative on frozen.   End descending colostomy left lower quadrant.  Right vertical rectus abdominis donor site to perineum per Dr. Iran Planas.  See her separate OR note   Drains as noted above     ######################   PREOPERATIVE DIAGNOSES:  1. Squamous cell carcinoma anus 2. History therapeutic radiation   POSTOPERATIVE DIAGNOSES:  same   PROCEDURE:  Vertical rectus abdominus myocutaneous flap to perineum   SURGEON: Irene Limbo MD MBA   FINDINGS: Bright dermal bleeding of skin paddle noted at end of flap inset. Closure anterior rectus fascia completed primarily by Dr. Johney Maine.  Consults:   Urology.  Dr. Louis Meckel Plastic reconstructive surgery.  Dr. Arnoldo Hooker [facility] Course:   Patient with history of anal cancer status post chemoradiation therapy.   Unfortunately developed recurrence.  Underwent the surgery above.  Postoperatively, the patient gradually mobilized and advanced to a solid diet.  Pain and other symptoms were treated aggressively.  Followed closely by myself and Dr. Iran Planas.  By the time of discharge, the patient was walking well the hallways, eating food, having flatus.  Pain was well-controlled on an oral medications.  Based on meeting discharge criteria and continuing to recover, I felt it was safe for the patient to be discharged from the hospital to further recover with close followup. Postoperative recommendations were discussed in detail.  They are written as well.  Discharged Condition: good  Discharge Exam: Blood pressure 134/89, pulse (!) 101, temperature 98.5 F (36.9 C), temperature source Oral, resp. rate 18, height 5' 6.5" (1.689 m), weight 70.9 kg, last menstrual period 03/13/2012, SpO2 99 %.  General: Pt awake/alert/oriented x4 in No acute distress Eyes: PERRL, normal EOM.  Sclera clear.  No icterus Neuro: CN II-XII intact w/o focal sensory/motor deficits. Lymph: No head/neck/groin lymphadenopathy Psych:  No delerium/psychosis/paranoia HENT: Normocephalic, Mucus membranes moist.  No thrush Neck: Supple, No tracheal deviation Chest:  No chest wall pain w good excursion CV:  Pulses intact.  Regular rhythm MS: Normal AROM mjr joints.  No obvious deformity  Abdomen: Soft.  Nondistended.  Nontender.  Mild lumpiness in right subcostal region above donor site consistent with expected normal healing ridge.  Colostomy viable with gas and liquid stool in bag.   Surgical drains serosanguineous.No evidence of peritonitis.  No incarcerated hernias.  Perineum with VRAM donor flap soft.  No cellulitis no necrosis   Ext:  SCDs BLE.  No mjr edema.  No cyanosis Skin:  No petechiae / purpura   Disposition:    Follow-up Information     Michael Boston, MD Follow up.   Specialties: General Surgery, Colon and Rectal  Surgery Why: To follow up after your operation, To follow up after your hospital stay Contact information: [place] [address] [phone]         [facility], [place] Follow up in 1 week(s).   Specialty: General Surgery Why: To have your right abdomen surgical JP/Blake drain removed & incisions re-checked Contact information: [place] [phone]        Irene Limbo, MD. Call.   Specialty: Plastic Surgery Why: To follow up after your operation, To have your VRAM flap in lower pelvis/perineum re-checked Contact information: [address] [place] [address] [phone]                 Discharge disposition: 01-Home or Self Care      Discharge Instructions     Call MD for:   Complete by: As directed    FEVER > 101.5 F  (temperatures < 101.5 F are not significant)   Call MD for:  extreme fatigue   Complete by: As directed    Call MD for:  persistant dizziness or light-headedness   Complete by: As directed    Call MD for:  persistant nausea and vomiting   Complete by: As directed    Call MD for:  redness, tenderness, or signs of infection (pain, swelling, redness, odor or green/yellow discharge around incision site)   Complete by: As directed    Call MD for:  severe uncontrolled pain   Complete by: As directed    Diet - low sodium heart healthy   Complete by: As directed    Start with a bland diet such as soups, liquids, starchy foods, low fat foods, etc. the first few days at home. Gradually advance to a solid, low-fat, high fiber diet by the end of the first week at home.   Add a fiber supplement to your diet (Metamucil, etc) If you feel full, bloated, or constipated, stay on a full liquid or pureed/blenderized diet for a few days until you feel better and are no longer constipated.   Discharge instructions   Complete by: As  directed    See Discharge Instructions concerning colostomy care and drain management, etc  If you are not getting better after two weeks or are noticing you are getting worse, contact our office [phone] for further advice.  We may need to adjust your medications, re-evaluate you in the office, send you to the emergency room, or see what other things we can do to help.  The clinic staff is available to answer your questions during regular business hours (8:30am-5pm).  Please don't hesitate to call and ask to speak to one of our nurses for clinical concerns.    A surgeon from [facility] is always on call at the hospitals 24 hours/day If you have a medical emergency, go to the nearest emergency room or call 911.   Discharge wound care:   Complete by: As directed    It is good for closed incisions and even open wounds to be washed every day.  Shower every day.  Short baths are fine.  Wash the incisions and wounds clean with soap & water.    You may leave closed incisions open to air if  it is dry.   You may cover the incision with clean gauze & replace it after your daily shower for comfort.  SEE OSTOMY CARE INSTRUCTIONS   Driving Restrictions   Complete by: As directed    You may drive when: - you are no longer taking narcotic prescription pain medication - you can comfortably wear a seatbelt - you can safely make sudden turns/stops without pain.   Increase activity slowly   Complete by: As directed    Start light daily activities --- self-care, walking, climbing stairs- beginning the day after surgery.  Gradually increase activities as tolerated.  Control your pain to be active.  Stop when you are tired.  Ideally, walk several times a day, eventually an hour a day.   Most people are back to most day-to-day activities in a few weeks.  It takes 4-6 weeks to get back to unrestricted, intense activity. If you can walk 30 minutes without difficulty, it is safe to try more  intense activity such as jogging, treadmill, bicycling, low-impact aerobics, swimming, etc. Save the most intensive and strenuous activity for last (Usually 4-8 weeks after surgery) such as sit-ups, heavy lifting, contact sports, etc.  Refrain from any intense heavy lifting or straining until you are off narcotics for pain control.  You will have off days, but things should improve week-by-week. DO NOT PUSH THROUGH PAIN.  Let pain be your guide: If it hurts to do something, don't do it.   Lifting restrictions   Complete by: As directed    If you can walk 30 minutes without difficulty, it is safe to try more intense activity such as jogging, treadmill, bicycling, low-impact aerobics, swimming, etc. Save the most intensive and strenuous activity for last (Usually 4-8 weeks after surgery) such as sit-ups, heavy lifting, contact sports, etc.   Refrain from any intense heavy lifting or straining until you are off narcotics for pain control.  You will have off days, but things should improve week-by-week. DO NOT PUSH THROUGH PAIN.  Let pain be your guide: If it hurts to do something, don't do it.  Pain is your body warning you to avoid that activity for another week until the pain goes down.   May shower / Bathe   Complete by: As directed    May walk up steps   Complete by: As directed    Sexual Activity Restrictions   Complete by: As directed    You may have sexual intercourse when it is comfortable. If it hurts to do something, stop.       Allergies as of 11/07/2021       Reactions   Clindamycin/lincomycin Rash   Sulfa Drugs Cross Reactors Itching, Rash        Medication List     TAKE these medications    bisacodyl 5 MG EC tablet Commonly known as: DULCOLAX Take by mouth.   cetirizine 10 MG tablet Commonly known as: ZYRTEC Take 10 mg by mouth daily.   gabapentin 300 MG capsule Commonly known as: NEURONTIN Take 1 capsule (300 mg total) by mouth 3 (three) times daily for 3  days.   HAIR SKIN & NAILS GUMMIES PO Take 2 tablets by mouth daily. Gummies   oxyCODONE 5 MG immediate release tablet Commonly known as: Oxy IR/ROXICODONE Take 1-2 tablets (5-10 mg total) by mouth every 6 (six) hours as needed for moderate pain, severe pain or breakthrough pain.   spironolactone 100 MG tablet Commonly known as: ALDACTONE Take 100 mg  by mouth daily.   triamcinolone cream 0.1 % Commonly known as: KENALOG Apply 1 application topically 4 (four) times daily as needed (eczema).               Discharge Care Instructions  (From admission, onward)           Start     Ordered   11/03/21 0000  Discharge wound care:       Comments: It is good for closed incisions and even open wounds to be washed every day.  Shower every day.  Short baths are fine.  Wash the incisions and wounds clean with soap & water.    You may leave closed incisions open to air if it is dry.   You may cover the incision with clean gauze & replace it after your daily shower for comfort.  SEE OSTOMY CARE INSTRUCTIONS   11/03/21 1042            Significant Diagnostic Studies:  Results for orders placed or performed during the hospital encounter of 11/03/21 (from the past 72 hour(s))  Creatinine, serum     Status: None   Collection Time: 11/05/21  4:13 AM  Result Value Ref Range   Creatinine, Ser 0.50 0.44 - 1.00 mg/dL   GFR, Estimated >60 >60 mL/min    Comment: (NOTE) Calculated using the CKD-EPI Creatinine Equation (2021) Performed at [facility], [place] [address] [place], [place] [address]   Potassium     Status: None   Collection Time: 11/05/21  4:13 AM  Result Value Ref Range   Potassium 3.8 3.5 - 5.1 mmol/L    Comment: Performed at [facility], [place] [address], [place], [place] [address]  Hemoglobin     Status: Abnormal   Collection Time: 11/05/21  4:13 AM  Result Value Ref Range   Hemoglobin 11.1 (L) 12.0 - 15.0 g/dL    Comment:  Performed at [facility], [place] [address], [place], [place] [address]  CBC     Status: Abnormal   Collection Time: 11/07/21  4:27 AM  Result Value Ref Range   WBC 7.8 4.0 - 10.5 K/uL   RBC 3.23 (L) 3.87 - 5.11 MIL/uL   Hemoglobin 10.9 (L) 12.0 - 15.0 g/dL   HCT 33.4 (L) 36.0 - 46.0 %   MCV 103.4 (H) 80.0 - 100.0 fL   MCH 33.7 26.0 - 34.0 pg   MCHC 32.6 30.0 - 36.0 g/dL   RDW 12.9 11.5 - 15.5 %   Platelets 162 150 - 400 K/uL   nRBC 0.0 0.0 - 0.2 %    Comment: Performed at [facility], [place] [address] [place], [place] [address]  Basic metabolic panel     Status: Abnormal   Collection Time: 11/07/21  4:27 AM  Result Value Ref Range   Sodium 135 135 - 145 mmol/L   Potassium 3.8 3.5 - 5.1 mmol/L   Chloride 103 98 - 111 mmol/L   CO2 26 22 - 32 mmol/L   Glucose, Bld 119 (H) 70 - 99 mg/dL    Comment: Glucose reference range applies only to samples taken after fasting for at least 8 hours.   BUN 7 6 - 20 mg/dL   Creatinine, Ser 0.41 (L) 0.44 - 1.00 mg/dL   Calcium 9.0 8.9 - 10.3 mg/dL   GFR, Estimated >60 >60 mL/min    Comment: (NOTE) Calculated using the CKD-EPI Creatinine Equation (2021)    Anion gap  6 5 - 15    Comment: Performed at [facility], [place] [address] [place], [place] [address]    No results found.  Past Medical History:  Diagnosis Date   Acne    on face spirolactone for   Anal cancer ([place]) 02/25/2021   Constipation, chronic 08/08/2016   History of migraine    none in years   Wears glasses     Past Surgical History:  Procedure Laterality Date   ABDOMINAL HYSTERECTOMY  2016   partial   COLONOSCOPY     COLONOSCOPY WITH PROPOFOL N/A 01/28/2021   Procedure: COLONOSCOPY WITH PROPOFOL;  Surgeon: Virgel Manifold, MD;  Location: [facility];  Service: Endoscopy;  Laterality: N/A;   EXCISION HYDRADENITIS LABIA Left 02/25/2021   Procedure: EXCISION of  LABIAL LESIOM;  Surgeon: Michael Boston, MD;   Location: [place];  Service: General;  Laterality: Left;   IR FLUORO GUIDE CV LINE RIGHT  04/18/2021   OSTOMY N/A 11/03/2021   Procedure: END COLOSTOMY;  Surgeon: Michael Boston, MD;  Location: [facility];  Service: General;  Laterality: N/A;   PECTORALIS FLAP N/A 11/03/2021   Procedure: VERTICAL RECTUS ABDOMINIS MYOCUTANEOUS TO PERINEUM;  Surgeon: Irene Limbo, MD;  Location: [facility];  Service: Plastics;  Laterality: N/A;   RECTAL BIOPSY N/A 09/30/2021   Procedure: ANORECTAL MASS BIOPSY;  Surgeon: Michael Boston, MD;  Location: [facility];  Service: General;  Laterality: N/A;   RECTAL EXAM UNDER ANESTHESIA N/A 02/25/2021   Procedure: ANORECTAL EXAM UNDER ANESTHESIA;  Surgeon: Michael Boston, MD;  Location: [place];  Service: General;  Laterality: N/A;   RECTAL EXAM UNDER ANESTHESIA N/A 09/30/2021   Procedure: ANORECTAL EXAMINATION UNDER ANESTHESIA;  Surgeon: Michael Boston, MD;  Location: [facility];  Service: General;  Laterality: N/A;  GEN & LOCAL ANESTHESIA   SPHINCTEROTOMY N/A 08/30/2016   Procedure: LATERAL INTERNAL AND SPHINCTEROTOMY;  Surgeon: Michael Boston, MD;  Location: [facility];  Service: General;  Laterality: N/A;   TONSILLECTOMY  as child   TRANSANAL EXCISION OF RECTAL MASS N/A 02/25/2021   Procedure: TRANSANAL EXCISIONAL  BIOPSY OF ANAL CANAL MASS;  Surgeon: Michael Boston, MD;  Location: [place];  Service: General;  Laterality: N/A;   XI ROBOT ABDOMINAL PERINEAL RESECTION N/A 11/03/2021   Procedure: XI ROBOT ABDOMINOPERINEAL RESECTION;  Surgeon: Michael Boston, MD;  Location: [facility];  Service: General;  Laterality: N/A;    Social History   Socioeconomic History   Marital status: Married    Spouse name: Not on file   Number of children: Not on file   Years of education: Not on file   Highest education level: Not on file  Occupational History   Not on file  Tobacco Use   Smoking status: Former    Packs/day: 1.00    Years: 8.00     Pack years: 8.00    Types: Cigarettes    Quit date: 02/15/2021    Years since quitting: 0.7   Smokeless tobacco: Never  Vaping Use   Vaping Use: Never used  Substance and Sexual Activity   Alcohol use: Yes    Comment: 2 dirnks daily   Drug use: No   Sexual activity: Yes    Birth control/protection: Surgical    Comment: Hysterectomy  Other Topics Concern   Not on file  Social History Narrative   Not on file   Social Determinants of Health   Financial Resource Strain: Not on file  Food  Insecurity: Not on file  Transportation Needs: Not on file  Physical Activity: Not on file  Stress: Not on file  Social Connections: Not on file  Intimate Partner Violence: Not on file    Family History  Problem Relation Age of Onset   Breast cancer Paternal Aunt     Current Facility-Administered Medications  Medication Dose Route Frequency Provider Last Rate Last Admin   acetaminophen (TYLENOL) 160 MG/5ML solution 1,000 mg  1,000 mg Oral Q6H Michael Boston, MD   1,000 mg at 11/07/21 0456   alum & mag hydroxide-simeth (MAALOX/MYLANTA) 200-200-20 MG/5ML suspension 30 mL  30 mL Oral Q6H PRN Michael Boston, MD       alvimopan (ENTEREG) capsule 12 mg  12 mg Oral BID Michael Boston, MD   12 mg at 11/06/21 0946   diphenhydrAMINE (BENADRYL) 12.5 MG/5ML elixir 12.5 mg  12.5 mg Oral Q6H PRN Michael Boston, MD       Or   diphenhydrAMINE (BENADRYL) injection 12.5 mg  12.5 mg Intravenous Q6H PRN Michael Boston, MD       enoxaparin (LOVENOX) injection 40 mg  40 mg Subcutaneous Q24H Michael Boston, MD   40 mg at 11/06/21 0817   feeding supplement (ENSURE SURGERY) liquid 237 mL  237 mL Oral BID BM Michael Boston, MD   237 mL at 11/06/21 1328   gabapentin (NEURONTIN) capsule 300 mg  300 mg Oral TID Michael Boston, MD   300 mg at 11/06/21 2100   HYDROmorphone (DILAUDID) injection 0.5-2 mg  0.5-2 mg Intravenous Q4H PRN Michael Boston, MD   1 mg at 11/06/21 1732   lip balm (CARMEX) ointment 1 application  1 application  Topical BID Michael Boston, MD   1 application at 74/25/95 2101   loratadine (CLARITIN) tablet 10 mg  10 mg Oral Daily Michael Boston, MD   10 mg at 11/06/21 6387   magic mouthwash  15 mL Oral QID PRN Michael Boston, MD       melatonin tablet 3 mg  3 mg Oral QHS PRN Michael Boston, MD       methocarbamol (ROBAXIN) 1,000 mg in dextrose 5 % 100 mL IVPB  1,000 mg Intravenous Q6H PRN Michael Boston, MD 200 mL/hr at 11/06/21 0556 1,000 mg at 11/06/21 0556   methocarbamol (ROBAXIN) tablet 1,000 mg  1,000 mg Oral Q6H PRN Michael Boston, MD   1,000 mg at 11/06/21 1550   metoprolol tartrate (LOPRESSOR) injection 5 mg  5 mg Intravenous Q6H PRN Michael Boston, MD       ondansetron (ZOFRAN) tablet 4 mg  4 mg Oral Q6H PRN Michael Boston, MD       Or   ondansetron (ZOFRAN) injection 4 mg  4 mg Intravenous Q6H PRN Michael Boston, MD   4 mg at 11/05/21 0001   oxyCODONE (Oxy IR/ROXICODONE) immediate release tablet 5-10 mg  5-10 mg Oral Q6H PRN Michael Boston, MD   10 mg at 11/07/21 0456   polycarbophil (FIBERCON) tablet 625 mg  625 mg Oral BID Michael Boston, MD   625 mg at 11/06/21 2100   prochlorperazine (COMPAZINE) tablet 10 mg  10 mg Oral Q6H PRN Michael Boston, MD       Or   prochlorperazine (COMPAZINE) injection 5-10 mg  5-10 mg Intravenous Q6H PRN Michael Boston, MD       simethicone (MYLICON) chewable tablet 40 mg  40 mg Oral Q6H PRN Michael Boston, MD       spironolactone (ALDACTONE) tablet 100 mg  100 mg Oral Daily Michael Boston, MD   100 mg at 11/06/21 0946   traMADol (ULTRAM) tablet 50-100 mg  50-100 mg Oral Q6H PRN Michael Boston, MD       triamcinolone cream (KENALOG) 0.1 % cream 1 application  1 application Topical QID PRN Michael Boston, MD         Allergies  Allergen Reactions   Clindamycin/Lincomycin Rash   Sulfa Drugs Cross Reactors Itching and Rash    Signed: Morton Peters, MD, FACS, MASCRS Esophageal, Gastrointestinal & Colorectal Surgery Robotic and Minimally Invasive  Surgery  [facility], [place]  [address], [place], [place] [address] [phone] Fax [phone] Main  CONTACT INFORMATION:  Weekday (9AM-5PM): Call [facility] main office at [phone]  Weeknight (5PM-9AM) or Weekend/Holiday: Check [email or web address] (password " [email or web address]") for General Surgery [facility] coverage  (Please, do not use SecureChat as it is not reliable communication to operating surgeons for immediate patient care)      11/07/2021, 8:06 AM

## 2021-11-09 LAB — SURGICAL PATHOLOGY

## 2021-11-12 DIAGNOSIS — C218 Malignant neoplasm of overlapping sites of rectum, anus and anal canal: Secondary | ICD-10-CM | POA: Diagnosis not present

## 2021-11-12 DIAGNOSIS — Z9181 History of falling: Secondary | ICD-10-CM | POA: Diagnosis not present

## 2021-11-12 DIAGNOSIS — Z87891 Personal history of nicotine dependence: Secondary | ICD-10-CM | POA: Diagnosis not present

## 2021-11-12 DIAGNOSIS — Z433 Encounter for attention to colostomy: Secondary | ICD-10-CM | POA: Diagnosis not present

## 2021-11-12 DIAGNOSIS — Z483 Aftercare following surgery for neoplasm: Secondary | ICD-10-CM | POA: Diagnosis not present

## 2021-11-12 DIAGNOSIS — G43909 Migraine, unspecified, not intractable, without status migrainosus: Secondary | ICD-10-CM | POA: Diagnosis not present

## 2021-11-14 NOTE — Progress Notes (Signed)
Patient s/p robotic APR surgery on 11/03/2021 Dx: recuuren t anal cancer with neuroendocrine/basaloid feature  [place]:   1.  Please set the patient up for GI Tumor Board especially to discuss pathology & postadjuvant med onc needs (chemo?/survival path?)  Patient Care Team: Caryl Bis, MD as PCP - General (Unknown Physician Specialty) Michael Boston, MD as Consulting Physician (General Surgery) Wonda Horner, MD as Consulting Physician (Gastroenterology) [name], MD as Referring Physician (Obstetrics and Gynecology) Virgel Manifold, MD as Consulting Physician (Gastroenterology) Ladell Pier, MD as Consulting Physician (Oncology)

## 2021-11-18 DIAGNOSIS — Z433 Encounter for attention to colostomy: Secondary | ICD-10-CM | POA: Diagnosis not present

## 2021-11-18 DIAGNOSIS — Z87891 Personal history of nicotine dependence: Secondary | ICD-10-CM | POA: Diagnosis not present

## 2021-11-18 DIAGNOSIS — C218 Malignant neoplasm of overlapping sites of rectum, anus and anal canal: Secondary | ICD-10-CM | POA: Diagnosis not present

## 2021-11-18 DIAGNOSIS — Z483 Aftercare following surgery for neoplasm: Secondary | ICD-10-CM | POA: Diagnosis not present

## 2021-11-18 DIAGNOSIS — G43909 Migraine, unspecified, not intractable, without status migrainosus: Secondary | ICD-10-CM | POA: Diagnosis not present

## 2021-11-18 DIAGNOSIS — Z9181 History of falling: Secondary | ICD-10-CM | POA: Diagnosis not present

## 2021-11-22 ENCOUNTER — Ambulatory Visit (HOSPITAL_COMMUNITY): Payer: BC Managed Care – PPO

## 2021-11-22 DIAGNOSIS — Z933 Colostomy status: Secondary | ICD-10-CM | POA: Diagnosis not present

## 2021-11-22 DIAGNOSIS — T8189XA Other complications of procedures, not elsewhere classified, initial encounter: Secondary | ICD-10-CM | POA: Diagnosis not present

## 2021-11-22 DIAGNOSIS — L709 Acne, unspecified: Secondary | ICD-10-CM | POA: Diagnosis not present

## 2021-11-23 ENCOUNTER — Other Ambulatory Visit: Payer: Self-pay

## 2021-11-23 DIAGNOSIS — Z433 Encounter for attention to colostomy: Secondary | ICD-10-CM | POA: Diagnosis not present

## 2021-11-23 DIAGNOSIS — Z87891 Personal history of nicotine dependence: Secondary | ICD-10-CM | POA: Diagnosis not present

## 2021-11-23 DIAGNOSIS — Z483 Aftercare following surgery for neoplasm: Secondary | ICD-10-CM | POA: Diagnosis not present

## 2021-11-23 DIAGNOSIS — Z9181 History of falling: Secondary | ICD-10-CM | POA: Diagnosis not present

## 2021-11-23 DIAGNOSIS — C218 Malignant neoplasm of overlapping sites of rectum, anus and anal canal: Secondary | ICD-10-CM | POA: Diagnosis not present

## 2021-11-23 DIAGNOSIS — G43909 Migraine, unspecified, not intractable, without status migrainosus: Secondary | ICD-10-CM | POA: Diagnosis not present

## 2021-11-23 NOTE — Progress Notes (Signed)
The proposed treatment discussed in conference is for discussion purpose only and is not a binding recommendation.  The patients have not been physically examined, or presented with their treatment options.  Therefore, final treatment plans cannot be decided.  

## 2021-11-29 ENCOUNTER — Other Ambulatory Visit: Payer: Self-pay

## 2021-11-29 ENCOUNTER — Inpatient Hospital Stay: Payer: BC Managed Care – PPO | Attending: Nurse Practitioner

## 2021-11-29 ENCOUNTER — Encounter: Payer: Self-pay | Admitting: Oncology

## 2021-11-29 ENCOUNTER — Telehealth: Payer: Self-pay | Admitting: *Deleted

## 2021-11-29 ENCOUNTER — Inpatient Hospital Stay: Payer: BC Managed Care – PPO

## 2021-11-29 ENCOUNTER — Ambulatory Visit (HOSPITAL_BASED_OUTPATIENT_CLINIC_OR_DEPARTMENT_OTHER)
Admission: RE | Admit: 2021-11-29 | Discharge: 2021-11-29 | Disposition: A | Discharge status: Home / Self Care | Payer: BC Managed Care – PPO | Source: Ambulatory Visit | Attending: Surgery | Admitting: Surgery

## 2021-11-29 ENCOUNTER — Inpatient Hospital Stay (HOSPITAL_BASED_OUTPATIENT_CLINIC_OR_DEPARTMENT_OTHER): Payer: BC Managed Care – PPO | Admitting: Oncology

## 2021-11-29 VITALS — BP 114/80 | HR 85 | Temp 97.8°F | Resp 20 | Ht 66.0 in | Wt 148.4 lb

## 2021-11-29 DIAGNOSIS — C211 Malignant neoplasm of anal canal: Secondary | ICD-10-CM | POA: Insufficient documentation

## 2021-11-29 DIAGNOSIS — C210 Malignant neoplasm of anus, unspecified: Secondary | ICD-10-CM | POA: Diagnosis not present

## 2021-11-29 DIAGNOSIS — Z933 Colostomy status: Secondary | ICD-10-CM | POA: Insufficient documentation

## 2021-11-29 DIAGNOSIS — K9400 Colostomy complication, unspecified: Secondary | ICD-10-CM

## 2021-11-29 DIAGNOSIS — L24B1 Irritant contact dermatitis related to digestive stoma or fistula: Secondary | ICD-10-CM

## 2021-11-29 DIAGNOSIS — Z23 Encounter for immunization: Secondary | ICD-10-CM

## 2021-11-29 LAB — CBC WITH DIFFERENTIAL (CANCER CENTER ONLY)
Abs Immature Granulocytes: 0.02 10*3/uL (ref 0.00–0.07)
Basophils Absolute: 0 10*3/uL (ref 0.0–0.1)
Basophils Relative: 0 %
Eosinophils Absolute: 0.5 10*3/uL (ref 0.0–0.5)
Eosinophils Relative: 7 %
HCT: 34.7 % — ABNORMAL LOW (ref 36.0–46.0)
Hemoglobin: 10.9 g/dL — ABNORMAL LOW (ref 12.0–15.0)
Immature Granulocytes: 0 %
Lymphocytes Relative: 12 %
Lymphs Abs: 0.8 10*3/uL (ref 0.7–4.0)
MCH: 31.6 pg (ref 26.0–34.0)
MCHC: 31.4 g/dL (ref 30.0–36.0)
MCV: 100.6 fL — ABNORMAL HIGH (ref 80.0–100.0)
Monocytes Absolute: 0.6 10*3/uL (ref 0.1–1.0)
Monocytes Relative: 10 %
Neutro Abs: 4.7 10*3/uL (ref 1.7–7.7)
Neutrophils Relative %: 71 %
Platelet Count: 279 10*3/uL (ref 150–400)
RBC: 3.45 MIL/uL — ABNORMAL LOW (ref 3.87–5.11)
RDW: 13.2 % (ref 11.5–15.5)
WBC Count: 6.6 10*3/uL (ref 4.0–10.5)
nRBC: 0 % (ref 0.0–0.2)

## 2021-11-29 MED ORDER — OXYCODONE HCL 5 MG PO TABS: 5.0000 mg | ORAL_TABLET | Freq: Four times a day (QID) | ORAL | 0 refills | Status: DC | PRN

## 2021-11-29 MED ORDER — INFLUENZA VAC SPLIT QUAD 0.5 ML IM SUSY
0.5000 mL | PREFILLED_SYRINGE | Freq: Once | INTRAMUSCULAR | Status: AC
  Administered 2021-11-29: 0.5 mL via INTRAMUSCULAR

## 2021-11-29 NOTE — Telephone Encounter (Signed)
Called patient w/CT appointment on 12/27 at [place] on 12/27 at 0845/0900. NPO 4 hours prior and oral contrast at 0700 and 0800. She will need to come to [facility] to pick up the oral contrast. Contrast in a bag at desk for her to pick up.,

## 2021-11-29 NOTE — Progress Notes (Signed)
[facility]   Reason for visit:  LLQ colostomy HPI:  Squamous cell carcinoma of anal canal with abdominoperineal resection and resulting colostomy ROS  Review of Systems Vital signs:  BP 114/72    Pulse 87    Temp 98.1 F (36.7 C) (Oral)    Resp 18    Ht 5\' 6"  (1.676 m)    Wt 67.1 kg    LMP 03/13/2012    SpO2 100%    BMI 23.89 kg/m  Exam:  Physical Exam  Stoma type/location:  LLQ colostomy Stomal assessment/size:  1 3/8" flush along lower hemisphere with os pointing down towards  Peristomal assessment:  denuded skin from 6 to 7 o'clock Treatment options for stomal/peristomal skin: stoma powder and skin prep to irritant contact dermatitis.  Added barrier ring Output: soft brown stool Ostomy pouching: 1pc.convex Education provided:  see above.  See back in 2 weeks    Impression/dx  Irritant contact dermatitis Discussion  Discussed leaks due to position of os and flush stoma Plan  Convex pouch, barrier ring and stoma powder and skin prep    Visit time: 45 minutes.   Domenic Moras FNP-BC

## 2021-11-29 NOTE — Patient Instructions (Signed)
Influenza Virus Vaccine injection What is this medication? INFLUENZA VIRUS VACCINE (in floo EN zuh VAHY ruhs vak SEEN) helps to reduce the risk of getting influenza also known as the flu. The vaccine only helps protect you against some strains of the flu. This medicine may be used for other purposes; ask your health care provider or pharmacist if you have questions. COMMON BRAND NAME(S): Afluria, Afluria Quadrivalent, Agriflu, Alfuria, FLUAD, FLUAD Quadrivalent, Fluarix, Fluarix Quadrivalent, Flublok, Flublok Quadrivalent, FLUCELVAX, FLUCELVAX Quadrivalent, Flulaval, Flulaval Quadrivalent, Fluvirin, Fluzone, Fluzone High-Dose, Fluzone Intradermal, Fluzone Quadrivalent What should I tell my care team before I take this medication? They need to know if you have any of these conditions: bleeding disorder like hemophilia fever or infection Guillain-Barre syndrome or other neurological problems immune system problems infection with the human immunodeficiency virus (HIV) or AIDS low blood platelet counts multiple sclerosis an unusual or allergic reaction to influenza virus vaccine, latex, other medicines, foods, dyes, or preservatives. Different brands of vaccines contain different allergens. Some may contain latex or eggs. Talk to your doctor about your allergies to make sure that you get the right vaccine. pregnant or trying to get pregnant breast-feeding How should I use this medication? This vaccine is for injection into a muscle or under the skin. It is given by a health care professional. A copy of Vaccine Information Statements will be given before each vaccination. Read this sheet carefully each time. The sheet may change frequently. Talk to your healthcare provider to see which vaccines are right for you. Some vaccines should not be used in all age groups. Overdosage: If you think you have taken too much of this medicine contact a poison control center or emergency room at once. NOTE: This  medicine is only for you. Do not share this medicine with others. What if I miss a dose? This does not apply. What may interact with this medication? chemotherapy or radiation therapy medicines that lower your immune system like etanercept, anakinra, infliximab, and adalimumab medicines that treat or prevent blood clots like warfarin phenytoin steroid medicines like prednisone or cortisone theophylline vaccines This list may not describe all possible interactions. Give your health care provider a list of all the medicines, herbs, non-prescription drugs, or dietary supplements you use. Also tell them if you smoke, drink alcohol, or use illegal drugs. Some items may interact with your medicine. What should I watch for while using this medication? Report any side effects that do not go away within 3 days to your doctor or health care professional. Call your health care provider if any unusual symptoms occur within 6 weeks of receiving this vaccine. You may still catch the flu, but the illness is not usually as bad. You cannot get the flu from the vaccine. The vaccine will not protect against colds or other illnesses that may cause fever. The vaccine is needed every year. What side effects may I notice from receiving this medication? Side effects that you should report to your doctor or health care professional as soon as possible: allergic reactions like skin rash, itching or hives, swelling of the face, lips, or tongue Side effects that usually do not require medical attention (report to your doctor or health care professional if they continue or are bothersome): fever headache muscle aches and pains pain, tenderness, redness, or swelling at the injection site tiredness This list may not describe all possible side effects. Call your doctor for medical advice about side effects. You may report side effects to FDA at 1-800-FDA-1088.  Where should I keep my medication? The vaccine will be given  by a health care professional in a clinic, pharmacy, doctor's office, or other health care setting. You will not be given vaccine doses to store at home. NOTE: This sheet is a summary. It may not cover all possible information. If you have questions about this medicine, talk to your doctor, pharmacist, or health care provider.  2022 Elsevier/Gold Standard (2021-08-23 00:00:00) Influenza, Adult Influenza, also called "the flu," is a viral infection that mainly affects the respiratory tract. This includes the lungs, nose, and throat. The flu spreads easily from person to person (is contagious). It causes common cold symptoms, along with high fever and body aches. What are the causes? This condition is caused by the influenza virus. You can get the virus by: Breathing in droplets that are in the air from an infected person's cough or sneeze. Touching something that has the virus on it (has been contaminated) and then touching your mouth, nose, or eyes. What increases the risk? The following factors may make you more likely to get the flu: Not washing or sanitizing your hands often. Having close contact with many people during cold and flu season. Touching your mouth, eyes, or nose without first washing or sanitizing your hands. Not getting an annual flu shot. You may have a higher risk for the flu, including serious problems, such as a lung infection (pneumonia), if you: Are older than 65. Are pregnant. Have a weakened disease-fighting system (immune system). This includes people who have HIV or AIDS, are on chemotherapy, or are taking medicines that reduce (suppress) the immune system. Have a long-term (chronic) illness, such as heart disease, kidney disease, diabetes, or lung disease. Have a liver disorder. Are severely overweight (morbidly obese). Have anemia. Have asthma. What are the signs or symptoms? Symptoms of this condition usually begin suddenly and last 4-14 days. These may  include: Fever and chills. Headaches, body aches, or muscle aches. Sore throat. Cough. Runny or stuffy (congested) nose. Chest discomfort. Poor appetite. Weakness or fatigue. Dizziness. Nausea or vomiting. How is this diagnosed? This condition may be diagnosed based on: Your symptoms and medical history. A physical exam. Swabbing your nose or throat and testing the fluid for the influenza virus. How is this treated? If the flu is diagnosed early, you can be treated with antiviral medicine that is given by mouth (orally) or through an IV. This can help reduce how severe the illness is and how long it lasts. Taking care of yourself at home can help relieve symptoms. Your health care provider may recommend: Taking over-the-counter medicines. Drinking plenty of fluids. In many cases, the flu goes away on its own. If you have severe symptoms or complications, you may be treated in a hospital. Follow these instructions at home: Activity Rest as needed and get plenty of sleep. Stay home from work or school as told by your health care provider. Unless you are visiting your health care provider, avoid leaving home until your fever has been gone for 24 hours without taking medicine. Eating and drinking Take an oral rehydration solution (ORS). This is a drink that is sold at pharmacies and retail stores. Drink enough fluid to keep your urine pale yellow. Drink clear fluids in small amounts as you are able. Clear fluids include water, ice chips, fruit juice mixed with water, and low-calorie sports drinks. Eat bland, easy-to-digest foods in small amounts as you are able. These foods include bananas, applesauce, rice, lean meats,  toast, and crackers. Avoid drinking fluids that contain a lot of sugar or caffeine, such as energy drinks, regular sports drinks, and soda. Avoid alcohol. Avoid spicy or fatty foods. General instructions   Take over-the-counter and prescription medicines only as told  by your health care provider. Use a cool mist humidifier to add humidity to the air in your home. This can make it easier to breathe. When using a cool mist humidifier, clean it daily. Empty the water and replace it with clean water. Cover your mouth and nose when you cough or sneeze. Wash your hands with soap and water often and for at least 20 seconds, especially after you cough or sneeze. If soap and water are not available, use alcohol-based hand sanitizer. Keep all follow-up visits. This is important. How is this prevented?  Get an annual flu shot. This is usually available in late summer, fall, or winter. Ask your health care provider when you should get your flu shot. Avoid contact with people who are sick during cold and flu season. This is generally fall and winter. Contact a health care provider if: You develop new symptoms. You have: Chest pain. Diarrhea. A fever. Your cough gets worse. You produce more mucus. You feel nauseous or you vomit. Get help right away if you: Develop shortness of breath or have difficulty breathing. Have skin or nails that turn a bluish color. Have severe pain or stiffness in your neck. Develop a sudden headache or sudden pain in your face or ear. Cannot eat or drink without vomiting. These symptoms may represent a serious problem that is an emergency. Do not wait to see if the symptoms will go away. Get medical help right away. Call your local emergency services (911 in the [place] Do not drive yourself to the hospital. Summary Influenza, also called "the flu," is a viral infection that primarily affects your respiratory tract. Symptoms of the flu usually begin suddenly and last 4-14 days. Getting an annual flu shot is the best way to prevent getting the flu. Stay home from work or school as told by your health care provider. Unless you are visiting your health care provider, avoid leaving home until your fever has been gone for 24 hours without  taking medicine. Keep all follow-up visits. This is important. This information is not intended to replace advice given to you by your health care provider. Make sure you discuss any questions you have with your health care provider. Document Revised: 07/23/2020 Document Reviewed: 07/23/2020 Elsevier Patient Education  [place].

## 2021-11-29 NOTE — Progress Notes (Signed)
[facility] OFFICE PROGRESS NOTE   Diagnosis: Anal cancer  INTERVAL HISTORY:   Beth [name] underwent an examination under anesthesia and biopsy of a ulcerative fibrotic mass next to the posterior rectal wall on 09/30/2021.  The pathology from an anal canal mass excision and rectal wall posterior excision revealed poorly differentiated carcinoma extending to the edge of the biopsy.  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   She underwent a robotic APR and vertical rectus abdominis muscular flap by Dr. Johney Maine and Dr. Iran Planas on 11/03/2021 Dense fibrotic tumor involve the anal canal and distal rectum with the closest resection margin being the right lateral pelvic floor/levator.  The pathology revealed a benign right lateral margin.  The rectosigmoid colon and anus resection was involved with invasive poorly differentiated carcinoma, 10 lymph nodes were negative for metastatic carcinoma.  All margins are negative for invasive carcinoma.  Treatment effect is present with a partial response score of 2.ypT1,ypN0.  Beth [name] continues to have pain at the perineum.  She takes oxycodone as needed. Objective:  Vital signs in last 24 hours:  Blood pressure 114/80, pulse 85, temperature 97.8 F (36.6 C), temperature source Oral, resp. rate 20, height 5\' 6"  (1.676 m), weight 148 lb 6.4 oz (67.3 kg), last menstrual period 03/13/2012, SpO2 100 %.    HEENT: Neck without mass Lymphatics: No cervical, supraclavicular, axillary, or inguinal nodes Resp: Lungs clear bilaterally Cardio: Regular rate and rhythm GI: Healed incision at the right abdomen graft site with mild associated tenderness, no hepatosplenomegaly Vascular: No leg edema  Skin: Perineal wound appears to be healing   Lab Results:  Lab Results   Component Value Date   WBC 6.6 11/29/2021   HGB 10.9 (L) 11/29/2021   HCT 34.7 (L) 11/29/2021   MCV 100.6 (H) 11/29/2021   PLT 279 11/29/2021   NEUTROABS 4.7 11/29/2021    CMP  Lab Results  Component Value Date   NA 135 11/07/2021   K 3.8 11/07/2021   CL 103 11/07/2021   CO2 26 11/07/2021   GLUCOSE 119 (H) 11/07/2021   BUN 7 11/07/2021   CREATININE 0.41 (L) 11/07/2021   CALCIUM 9.0 11/07/2021   PROT 6.6 04/18/2021   ALBUMIN 4.2 04/18/2021   AST 19 04/18/2021   ALT 32 04/18/2021   ALKPHOS 55 04/18/2021   BILITOT 0.5 04/18/2021   GFRNONAA >60 11/07/2021   GFRAA 106 11/29/2020     Medications: I have reviewed the patient's current medications.   Assessment/Plan: Squamous cell carcinoma of the anal canal Colonoscopy 01/28/2021-rectal mass extending to the "outside ", palpable on exam-biopsy revealed high-grade squamous intraepithelial lesion (AIN 3/carcinoma in situ) Exam under anesthesia with transanal excision of an anal canal mass 02/25/2021-frozen section consistent with squamous cell carcinoma, 4 x 4 centimeter sessile anal canal mass extending to the anal verge, right lateral and posterior, 30% circumferential, fixed to the anal sphincter complex; anal rectal mass with mixed high-grade neuroendocrine squamous cell carcinoma; posterior anal tag with squamous cell carcinoma; left labial lesion-condyloma acuminata CTs 03/07/2021-ill-definition of tissue planes along the anus with abnormal soft tissue prominence posteriorly along the anal canal suspicious for mass.  Small adjacent lower perirectal lymph nodes in addition to a 0.5 cm sacral node.  Focal enhancing lesion in the right hepatic lobe 1.2 x 0.8 x 0.9 cm.  2.1 x 1.5 cm cystic lesion right ovary with thin  enhancing rim. PET scan 03/16/2021-hypermetabolic mass within the anal canal.  No definite evidence of local or distant metastatic disease.  7 mm left submandibular lymph node with mild FDG uptake, likely reactive.  Focal  site of FDG uptake within the inner quadrant of the right breast. Radiation 03/21/2021-04/27/2021 Cycle one 5-FU/mitomycin-C 03/21/2021 Cycle two 5-FU/Mitomycin-C 04/18/2021, 5-FU dose reduced secondary to mucositis Anoscopy by Dr. Johney Maine 07/20/2021-divet in the right posterior lateral aspect with some right anterior and right lateral folds consistent with radiation treatment.  No ulceration.  Not fully resolved but markedly shrunk down. Anorectal mass 09/30/2021 examination under anesthesia, biopsy-poorly differentiated carcinoma-basaloid squamous carcinoma and high-grade neuroendocrine carcinoma, margins positive APR/and vertical rectus abdominis flap 11/03/2021, invasive poorly differentiated carcinoma, negative resection margins, 0/10 nodes, treatment response score 2,ypT1,ypN0  Remote history of an anal fissure repair Tobacco use Moderate alcohol use Eczema Right breast mass- PET scan 03/16/2021 with focal FDG activity in the inner right breast Mammogram and right breast ultrasound 03/28/2021- negative Bilateral breast MRI 03/31/2021- 7 mm mass in the upper inner right breast correlating with the PET findings, no other abnormality, no adenopathy MRI guided biopsy of right breast mass 04/11/2021- fibrocystic change with sclerosing adenosis, small focus of inflammation/fibrosis suggestive of resolving fat necrosis, no evidence of malignancy--repeat bilateral breast MRI at a 49-month interval      Disposition: Beth [name] has been diagnosed with local persistence/progression of anal carcinoma.  She underwent an APR and rectus flap procedure on 11/03/2021.  The pathology from this procedure and a biopsy 09/30/2021 confirmed involvement with poorly differentiated carcinoma consisting of basaloid squamous carcinoma and a high-grade neuroendocrine component.  The resection margins are negative. There is no clinical evidence of distant metastatic disease.  Her case was presented at the GI tumor conference last  week.  There is no recommendation for "adjuvant "therapy.  I recommend restaging CTs of the chest, abdomen, and pelvis.  This will be scheduled in approximately 2 weeks.  Beth [name] received an influenza vaccine today.  She will return for an office visit in 3 months.  I refilled her prescription for oxycodone.  She will continue postoperative care with Dr. Johney Maine and Dr.Thimmappa  [name], MD  11/29/2021  1:32 PM

## 2021-11-29 NOTE — Discharge Instructions (Signed)
Stay with 1 piece convex pouch Added stoma powder and skin prep to irritated skin Barrier ring Items flagged in [employer]. Call office if you need prescription.

## 2021-12-03 ENCOUNTER — Encounter (HOSPITAL_COMMUNITY): Admission: EM | Disposition: A | Discharge status: Home / Self Care | Payer: Self-pay | Source: Home / Self Care

## 2021-12-03 ENCOUNTER — Emergency Department (HOSPITAL_COMMUNITY): Payer: BC Managed Care – PPO

## 2021-12-03 ENCOUNTER — Encounter (HOSPITAL_COMMUNITY): Payer: Self-pay

## 2021-12-03 ENCOUNTER — Inpatient Hospital Stay (HOSPITAL_COMMUNITY)
Admission: EM | Admit: 2021-12-03 | Discharge: 2021-12-08 | DRG: 336 | Disposition: A | Discharge status: Home / Self Care | Payer: BC Managed Care – PPO | Attending: Surgery | Admitting: Surgery

## 2021-12-03 ENCOUNTER — Other Ambulatory Visit: Payer: Self-pay

## 2021-12-03 DIAGNOSIS — Z20822 Contact with and (suspected) exposure to covid-19: Secondary | ICD-10-CM | POA: Diagnosis present

## 2021-12-03 DIAGNOSIS — K567 Ileus, unspecified: Secondary | ICD-10-CM | POA: Diagnosis not present

## 2021-12-03 DIAGNOSIS — K460 Unspecified abdominal hernia with obstruction, without gangrene: Principal | ICD-10-CM | POA: Diagnosis present

## 2021-12-03 DIAGNOSIS — K56609 Unspecified intestinal obstruction, unspecified as to partial versus complete obstruction: Secondary | ICD-10-CM | POA: Diagnosis not present

## 2021-12-03 DIAGNOSIS — D72829 Elevated white blood cell count, unspecified: Secondary | ICD-10-CM | POA: Diagnosis not present

## 2021-12-03 DIAGNOSIS — C211 Malignant neoplasm of anal canal: Secondary | ICD-10-CM | POA: Diagnosis present

## 2021-12-03 DIAGNOSIS — Z882 Allergy status to sulfonamides status: Secondary | ICD-10-CM | POA: Diagnosis not present

## 2021-12-03 DIAGNOSIS — Z85048 Personal history of other malignant neoplasm of rectum, rectosigmoid junction, and anus: Secondary | ICD-10-CM | POA: Diagnosis not present

## 2021-12-03 DIAGNOSIS — K3189 Other diseases of stomach and duodenum: Secondary | ICD-10-CM | POA: Diagnosis not present

## 2021-12-03 DIAGNOSIS — Z881 Allergy status to other antibiotic agents status: Secondary | ICD-10-CM | POA: Diagnosis not present

## 2021-12-03 DIAGNOSIS — K469 Unspecified abdominal hernia without obstruction or gangrene: Secondary | ICD-10-CM | POA: Diagnosis not present

## 2021-12-03 DIAGNOSIS — K6389 Other specified diseases of intestine: Secondary | ICD-10-CM | POA: Diagnosis not present

## 2021-12-03 DIAGNOSIS — Z87891 Personal history of nicotine dependence: Secondary | ICD-10-CM | POA: Diagnosis not present

## 2021-12-03 DIAGNOSIS — Z9889 Other specified postprocedural states: Secondary | ICD-10-CM

## 2021-12-03 DIAGNOSIS — R1084 Generalized abdominal pain: Secondary | ICD-10-CM | POA: Diagnosis not present

## 2021-12-03 DIAGNOSIS — J90 Pleural effusion, not elsewhere classified: Secondary | ICD-10-CM | POA: Diagnosis not present

## 2021-12-03 DIAGNOSIS — C210 Malignant neoplasm of anus, unspecified: Secondary | ICD-10-CM

## 2021-12-03 DIAGNOSIS — K660 Peritoneal adhesions (postprocedural) (postinfection): Secondary | ICD-10-CM | POA: Diagnosis not present

## 2021-12-03 DIAGNOSIS — I10 Essential (primary) hypertension: Secondary | ICD-10-CM | POA: Diagnosis not present

## 2021-12-03 DIAGNOSIS — Z933 Colostomy status: Secondary | ICD-10-CM

## 2021-12-03 DIAGNOSIS — R110 Nausea: Secondary | ICD-10-CM | POA: Diagnosis not present

## 2021-12-03 DIAGNOSIS — R188 Other ascites: Secondary | ICD-10-CM | POA: Diagnosis not present

## 2021-12-03 HISTORY — PX: LAPAROTOMY: SHX154

## 2021-12-03 LAB — CBC WITH DIFFERENTIAL/PLATELET
Abs Immature Granulocytes: 0.07 10*3/uL (ref 0.00–0.07)
Basophils Absolute: 0 10*3/uL (ref 0.0–0.1)
Basophils Relative: 0 %
Eosinophils Absolute: 0.1 10*3/uL (ref 0.0–0.5)
Eosinophils Relative: 1 %
HCT: 41.1 % (ref 36.0–46.0)
Hemoglobin: 13.6 g/dL (ref 12.0–15.0)
Immature Granulocytes: 1 %
Lymphocytes Relative: 6 %
Lymphs Abs: 0.7 10*3/uL (ref 0.7–4.0)
MCH: 32.6 pg (ref 26.0–34.0)
MCHC: 33.1 g/dL (ref 30.0–36.0)
MCV: 98.6 fL (ref 80.0–100.0)
Monocytes Absolute: 0.4 10*3/uL (ref 0.1–1.0)
Monocytes Relative: 4 %
Neutro Abs: 10.9 10*3/uL — ABNORMAL HIGH (ref 1.7–7.7)
Neutrophils Relative %: 88 %
Platelets: 359 10*3/uL (ref 150–400)
RBC: 4.17 MIL/uL (ref 3.87–5.11)
RDW: 13.3 % (ref 11.5–15.5)
WBC: 12.2 10*3/uL — ABNORMAL HIGH (ref 4.0–10.5)
nRBC: 0 % (ref 0.0–0.2)

## 2021-12-03 LAB — COMPREHENSIVE METABOLIC PANEL
ALT: 62 U/L — ABNORMAL HIGH (ref 0–44)
AST: 62 U/L — ABNORMAL HIGH (ref 15–41)
Albumin: 4.3 g/dL (ref 3.5–5.0)
Alkaline Phosphatase: 91 U/L (ref 38–126)
Anion gap: 15 (ref 5–15)
BUN: 11 mg/dL (ref 6–20)
CO2: 22 mmol/L (ref 22–32)
Calcium: 9.9 mg/dL (ref 8.9–10.3)
Chloride: 100 mmol/L (ref 98–111)
Creatinine, Ser: 0.56 mg/dL (ref 0.44–1.00)
GFR, Estimated: >60 mL/min (ref >60)
Glucose, Bld: 168 mg/dL — ABNORMAL HIGH (ref 70–99)
Potassium: 3.5 mmol/L (ref 3.5–5.1)
Sodium: 137 mmol/L (ref 135–145)
Total Bilirubin: 0.8 mg/dL (ref 0.3–1.2)
Total Protein: 8.3 g/dL — ABNORMAL HIGH (ref 6.5–8.1)

## 2021-12-03 LAB — URINALYSIS, ROUTINE W REFLEX MICROSCOPIC
Bacteria, UA: NONE SEEN
Bilirubin Urine: NEGATIVE
Glucose, UA: NEGATIVE mg/dL
Hgb urine dipstick: NEGATIVE
Ketones, ur: 20 mg/dL — AB
Nitrite: NEGATIVE
Protein, ur: >=300 mg/dL — AB
Specific Gravity, Urine: 1.028 (ref 1.005–1.030)
pH: 8 (ref 5.0–8.0)

## 2021-12-03 LAB — RESP PANEL BY RT-PCR (FLU A&B, COVID) ARPGX2
Influenza A by PCR: NEGATIVE
Influenza B by PCR: NEGATIVE
SARS Coronavirus 2 by RT PCR: NEGATIVE

## 2021-12-03 LAB — LIPASE, BLOOD: Lipase: 23 U/L (ref 11–51)

## 2021-12-03 IMAGING — CT CT ABD-PELV W/ CM
2 of 5 series · 13 of 46 positions shown, 15 images · IV contrast (omnipaque)
Comparison: CT chest, abdomen and pelvis with contrast [DATE].

CLINICAL DATA: History of anal cancer resection surgery and
colostomy last month, with worsening lower abdominal pain.

EXAM:
CT ABDOMEN AND PELVIS WITH CONTRAST
TECHNIQUE: Multidetector CT imaging of the abdomen and pelvis was performed
using the standard protocol following bolus administration of
intravenous contrast.
CONTRAST:  100mL OMNIPAQUE IOHEXOL 300 MG/ML  SOLN

[Series 2: axial st · axial · 0.82mm/px · z∈[+842,+1307]mm · 10 of 107 slices shown, 12 images]
[im 7/107  soft-tissue]
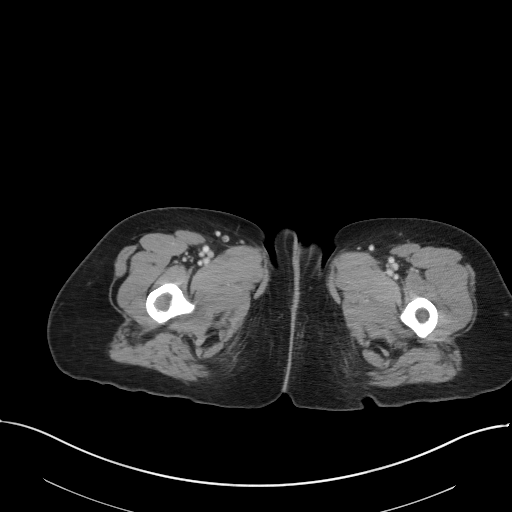
[im 7/107  bone]
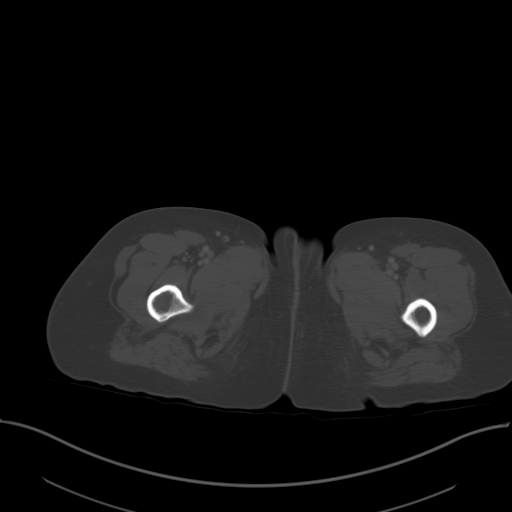
[im 20/107  soft-tissue]
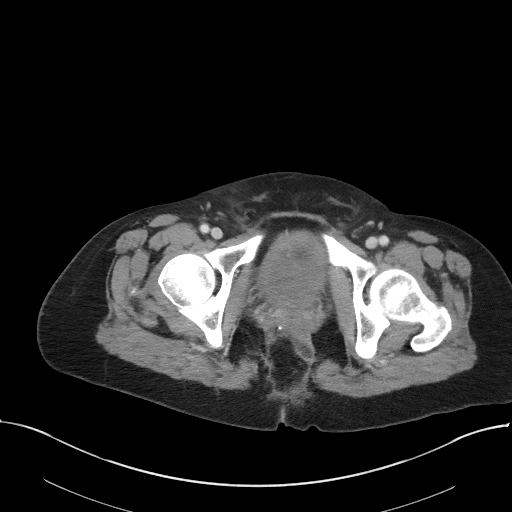
[im 27/107  soft-tissue]
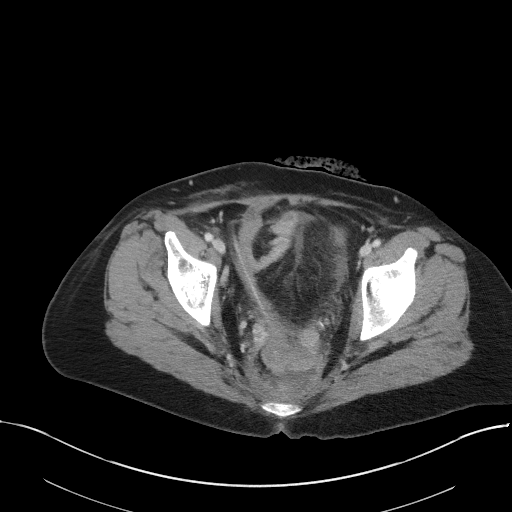
[im 40/107  soft-tissue]
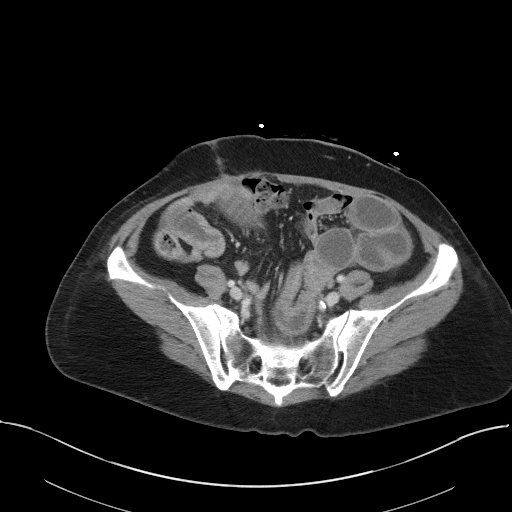
[im 47/107  soft-tissue]
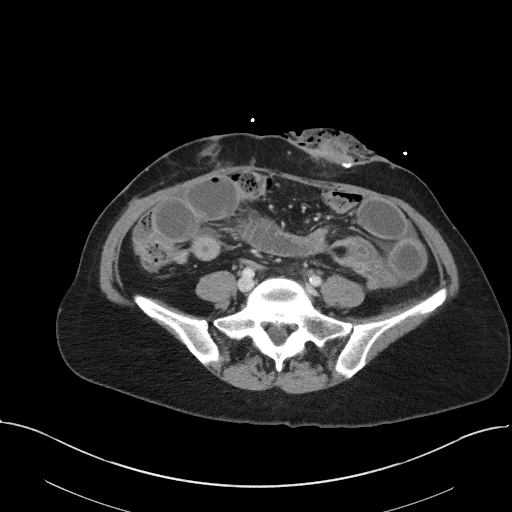
[im 60/107  soft-tissue]
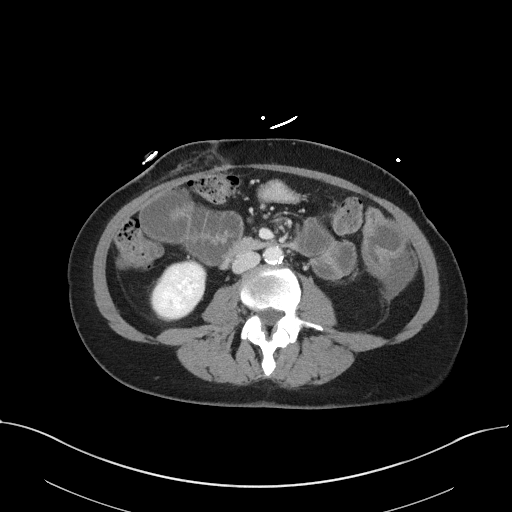
[im 67/107  soft-tissue]
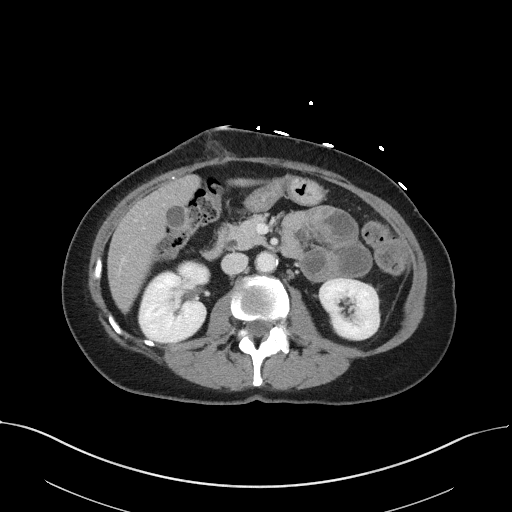
[im 80/107  soft-tissue]
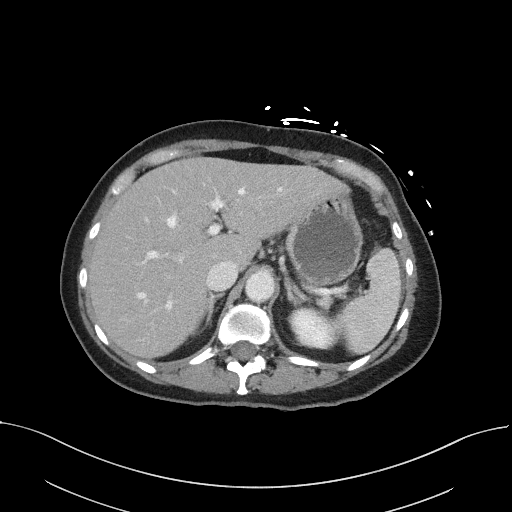
[im 87/107  soft-tissue]
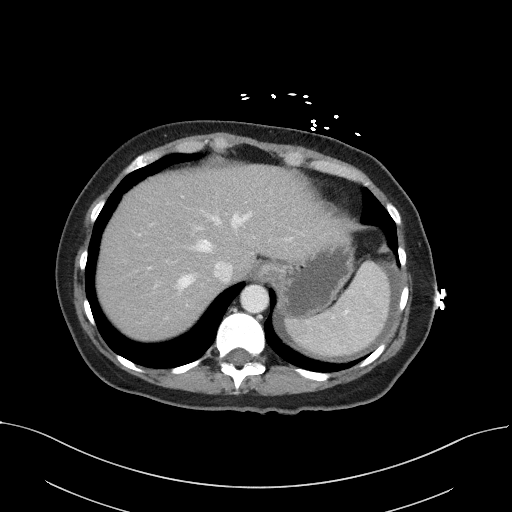
[im 87/107  bone]
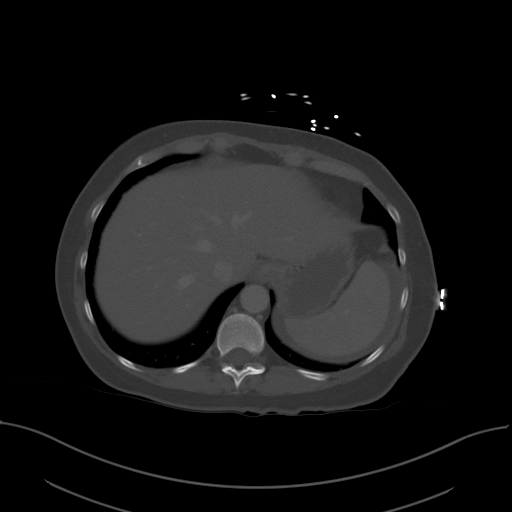
[im 100/107  soft-tissue]
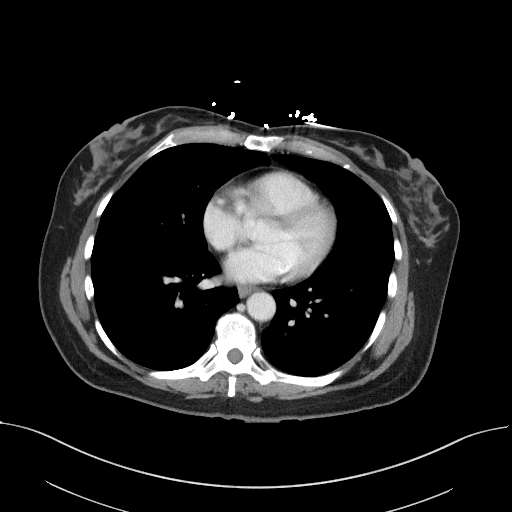

[Series 5: coronal st · coronal · 0.74mm/px · 3 of 93 slices shown]
[im 31/93  soft-tissue]
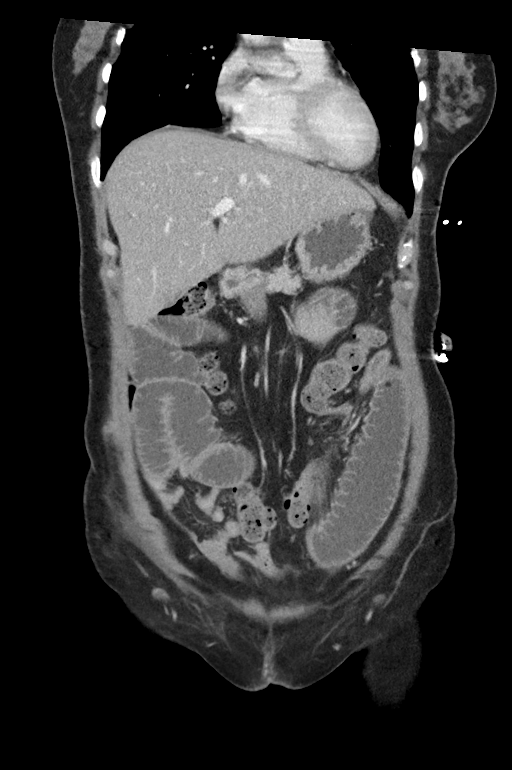
[im 41/93  soft-tissue]
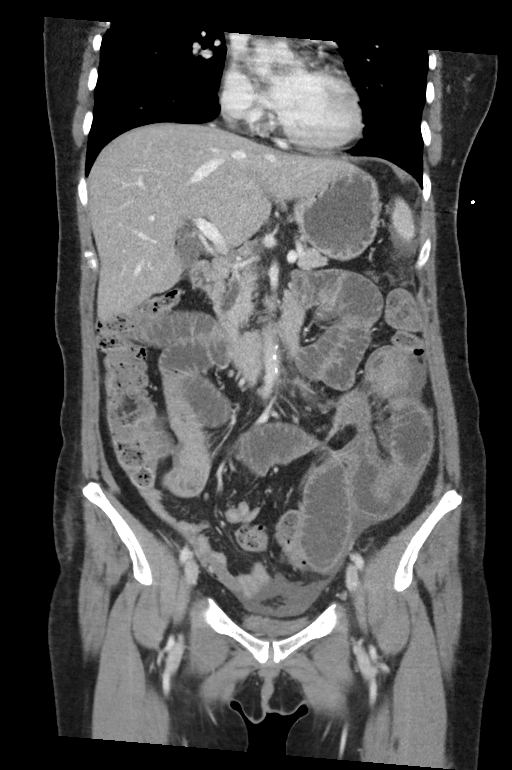
[im 52/93  soft-tissue]
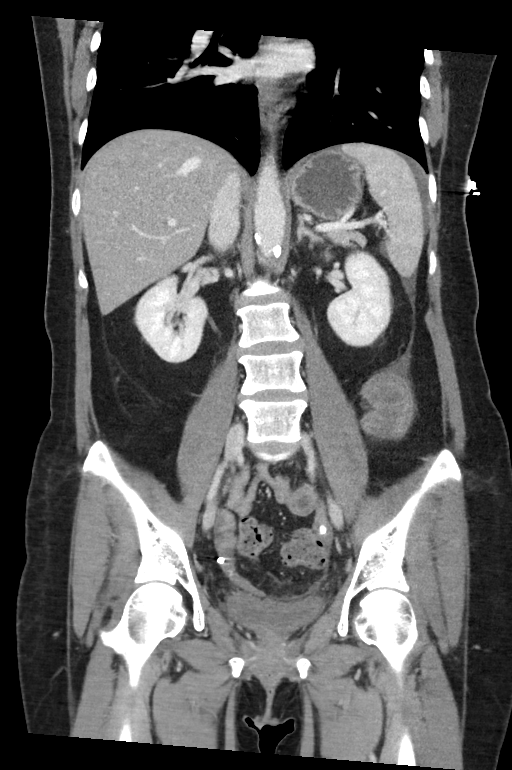

[13 of 46 positions shown; findings below may reference images not displayed]

FINDINGS: Lower chest: No lung base nodule is seen. The cardiac size is
normal. Calcification is again noted in the LAD coronary artery.

Hepatobiliary: No focal liver abnormality is seen. No gallstones or
gallbladder wall thickening. The common bile duct is more prominent
than previously, currently 8 mm in diameter previously 5 mm.
Laboratory and clinical correlation advised. No ductal filling
defect is visible.

Pancreas: Unremarkable.

Spleen: Unremarkable.

Adrenals/Urinary Tract: There is no adrenal mass. No renal mass,
calculus or hydronephrosis. Somewhat thickened bladder is seen but
also nondistended.

Stomach/Bowel: There are new surgical changes. There has been
resection of the rectosigmoid colon with output of the descending
colon through a left mid abdominal colostomy 7-8 cm to left of the
umbilicus.

There is evidence of a prior incision right of midline which might
have been the laparotomy incision and extends for several cm,
postsurgical changes in the abdominal wall underlying this.

The stomach and duodenum are relatively decompressed, but beginning
at the jejunum there is dilatation of small bowel up to 4 cm in
caliber with multiple dilated segments in the upper 2 mid abdomen.

The transition is in the left lower quadrant where there is a
clockwise twisting of the small bowel and mesenteric vessels on
axial images 55-63 of series 2 consistent with a postoperative small
bowel volvulus.

Some of the dilated small bowel above this is thickened and there is
mesenteric edema both above and below this level in the left
abdomen, and scattered fluid in the left abdominal mesenteric folds
tracking cephalad along side the spleen.

No bowel pneumatosis is evident but the wall thickening in the
dilated segments could indicate early ischemia.

There is mild stool retention in the ascending and transverse colon.
The transverse colon is quite redundant in this patient with the
midportion dipping down into the pelvis including into the presacral
space. There are scattered uncomplicated diverticula.

Vascular/Lymphatic: Aortic atherosclerosis. No enlarged abdominal or
pelvic lymph nodes.

Reproductive: Uterus is absent. There previously was a 2.1 cm left
ovarian cyst which is no longer seen. There is no adnexal mass.

Other: As above there is scattered ascites in the left abdomen
tracking into the pelvis. There is a presacral thick walled rim
enhancing collection measuring 4.4 x 2.8 by 3.9 cm (series 2 axial
79, coronal reconstruction image 76) and 10.9 Hounsfield units and
does not appear connected to the bowel. This could be a seroma,
hematoma, or abscess but there is no air in the collection. No
pneumoperitoneum is seen.

Musculoskeletal: Slight lumbar dextroscoliosis. No destructive
osseous or lytic lesions.
IMPRESSION: 1. High-grade small-bowel obstruction transitioning in the left
lower abdomen where there is a clockwise twisting of the bowel and
mesentery consistent with a postoperative volvulus or internal
hernia.
2. The level of obstruction is probably in the mid ileum with
decompressed small bowel in the low central abdomen and right lower
quadrant.
3. Some of the small bowel above this level is thickened, with
mesenteric congestive features adjacent. No pneumatosis is seen but
early ischemia can't be excluded.
4. Presacral rim enhancing 4.4 x 2.8 x 3.8 cm fluid collection which
could be a seroma, hematoma or abscess. There is no air in the
collection.
5. Postsurgical changes with rectosigmoid resection and left mid
abdominal descending colostomy.
6. Cystitis versus bladder nondistention.
7. Small amount of abdominal and pelvic ascites.
8. Prior study described a potential abnormality in the right lobe
of the liver which is not seen today, and a cystic lesion of the
right ovary which has resolved.
9. Mild prominence of the common bile duct increased from the prior
study. Laboratory and clinical correlation advised. No focal
abnormality of the gallbladder. No significant intrahepatic biliary
prominence.

## 2021-12-03 SURGERY — LAPAROTOMY, EXPLORATORY
Anesthesia: General | Site: Abdomen

## 2021-12-03 MED ORDER — IOHEXOL 300 MG/ML  SOLN
100.0000 mL | Freq: Once | INTRAMUSCULAR | Status: AC | PRN
  Administered 2021-12-03: 100 mL via INTRAVENOUS

## 2021-12-03 MED ORDER — PIPERACILLIN-TAZOBACTAM 3.375 G IVPB 30 MIN
3.3750 g | Freq: Once | INTRAVENOUS | Status: AC
  Administered 2021-12-03: 3.375 g via INTRAVENOUS
  Filled 2021-12-03: qty 50

## 2021-12-03 MED ORDER — ONDANSETRON HCL 4 MG/2ML IJ SOLN
4.0000 mg | Freq: Once | INTRAMUSCULAR | Status: AC
  Administered 2021-12-03: 4 mg via INTRAVENOUS
  Filled 2021-12-03: qty 2

## 2021-12-03 MED ORDER — HYDROMORPHONE HCL 1 MG/ML IJ SOLN
1.0000 mg | Freq: Once | INTRAMUSCULAR | Status: AC
  Administered 2021-12-03: 1 mg via INTRAVENOUS
  Filled 2021-12-03: qty 1

## 2021-12-03 MED ORDER — SODIUM CHLORIDE 0.9 % IV BOLUS
1000.0000 mL | Freq: Once | INTRAVENOUS | Status: AC
  Administered 2021-12-03: 1000 mL via INTRAVENOUS

## 2021-12-03 SURGICAL SUPPLY — 41 items
APL PRP STRL LF DISP 70% ISPRP (MISCELLANEOUS) ×1
CANISTER SUCT 3000ML PPV (MISCELLANEOUS) ×4 IMPLANT
CHLORAPREP W/TINT 26 (MISCELLANEOUS) ×4 IMPLANT
COVER SURGICAL LIGHT HANDLE (MISCELLANEOUS) ×4 IMPLANT
DRAPE LAPAROSCOPIC ABDOMINAL (DRAPES) ×4 IMPLANT
DRAPE WARM FLUID 44X44 (DRAPES) ×4 IMPLANT
DRSG OPSITE POSTOP 4X10 (GAUZE/BANDAGES/DRESSINGS) ×2 IMPLANT
DRSG OPSITE POSTOP 4X8 (GAUZE/BANDAGES/DRESSINGS) IMPLANT
ELECT BLADE 6.5 EXT (BLADE) IMPLANT
ELECT CAUTERY BLADE 6.4 (BLADE) ×4 IMPLANT
ELECT REM PT RETURN 9FT ADLT (ELECTROSURGICAL) ×3
ELECTRODE REM PT RTRN 9FT ADLT (ELECTROSURGICAL) ×2 IMPLANT
GLOVE SRG 8 PF TXTR STRL LF DI (GLOVE) ×2 IMPLANT
GLOVE SURG ENC MOIS LTX SZ7.5 (GLOVE) ×4 IMPLANT
GLOVE SURG POLYISO LF SZ7 (GLOVE) ×2 IMPLANT
GLOVE SURG SYN 7.5  E (GLOVE) ×3
GLOVE SURG SYN 7.5 E (GLOVE) ×1 IMPLANT
GLOVE SURG SYN 7.5 PF PI (GLOVE) IMPLANT
GLOVE SURG UNDER POLY LF SZ8 (GLOVE) ×3
GOWN STRL REUS W/ TWL LRG LVL3 (GOWN DISPOSABLE) ×2 IMPLANT
GOWN STRL REUS W/ TWL XL LVL3 (GOWN DISPOSABLE) ×2 IMPLANT
GOWN STRL REUS W/TWL LRG LVL3 (GOWN DISPOSABLE) ×3
GOWN STRL REUS W/TWL XL LVL3 (GOWN DISPOSABLE) ×3
HANDLE SUCTION POOLE (INSTRUMENTS) ×2 IMPLANT
KIT BASIN OR (CUSTOM PROCEDURE TRAY) ×4 IMPLANT
KIT OSTOMY DRAINABLE 2.75 STR (WOUND CARE) ×2 IMPLANT
KIT TURNOVER KIT B (KITS) ×4 IMPLANT
PACK GENERAL/GYN (CUSTOM PROCEDURE TRAY) ×4 IMPLANT
PAD ARMBOARD 7.5X6 YLW CONV (MISCELLANEOUS) ×4 IMPLANT
PENCIL SMOKE EVACUATOR (MISCELLANEOUS) ×4 IMPLANT
SPONGE T-LAP 18X18 ~~LOC~~+RFID (SPONGE) IMPLANT
STAPLER VISISTAT 35W (STAPLE) ×4 IMPLANT
SUCTION POOLE HANDLE (INSTRUMENTS) ×3
SUT PDS AB 1 TP1 54 (SUTURE) ×8 IMPLANT
SUT SILK 2 0 SH CR/8 (SUTURE) ×4 IMPLANT
SUT SILK 2 0 TIES 10X30 (SUTURE) ×4 IMPLANT
SUT SILK 3 0 SH CR/8 (SUTURE) ×4 IMPLANT
SUT SILK 3 0 TIES 10X30 (SUTURE) ×4 IMPLANT
TOWEL GREEN STERILE (TOWEL DISPOSABLE) ×4 IMPLANT
TRAY FOLEY MTR SLVR 14FR STAT (SET/KITS/TRAYS/PACK) ×2 IMPLANT
YANKAUER SUCT BULB TIP NO VENT (SUCTIONS) ×2 IMPLANT

## 2021-12-03 NOTE — ED Triage Notes (Signed)
PT brought in by [facility] due to abdominal pain. EMS states pt had surgery in abdomen last month and has been having pain, N/V, since then. Was given zofran en route.

## 2021-12-03 NOTE — Anesthesia Preprocedure Evaluation (Addendum)
Anesthesia Evaluation  Patient identified by MRN, date of birth, ID band Patient awake    Reviewed: Allergy & Precautions, H&P , NPO status , Patient's Chart, lab work & pertinent test results  Airway Mallampati: II   Neck ROM: full    Dental  (+) Caps, Teeth Intact, Dental Advisory Given,    Pulmonary former smoker,    breath sounds clear to auscultation       Cardiovascular negative cardio ROS   Rhythm:regular Rate:Normal     Neuro/Psych    GI/Hepatic H/o anal CA s/p resection and ileostomy x76month ago.  Now with SBO.   Endo/Other    Renal/GU      Musculoskeletal   Abdominal   Peds  Hematology   Anesthesia Other Findings   Reproductive/Obstetrics                            Anesthesia Physical Anesthesia Plan  ASA: 2 and emergent  Anesthesia Plan: General   Post-op Pain Management:    Induction: Intravenous  PONV Risk Score and Plan: 3 and Ondansetron, Dexamethasone, Midazolam and Treatment may vary due to age or medical condition  Airway Management Planned: Oral ETT  Additional Equipment:   Intra-op Plan:   Post-operative Plan: Extubation in OR  Informed Consent: I have reviewed the patients History and Physical, chart, labs and discussed the procedure including the risks, benefits and alternatives for the proposed anesthesia with the patient or authorized representative who has indicated his/her understanding and acceptance.     Dental advisory given  Plan Discussed with: CRNA, Anesthesiologist and Surgeon  Anesthesia Plan Comments:         Anesthesia Quick Evaluation

## 2021-12-03 NOTE — ED Notes (Signed)
ED Provider at bedside. 

## 2021-12-03 NOTE — ED Provider Notes (Signed)
Beth [name] Provider Note   CSN: [id number] Arrival date & time: 12/03/21  1845     History No chief complaint on file.   Beth [name] is a 50 y.o. female.  HPI  Patient with medical history including squamous cell carcinoma of the anal canal currently not on immunotherapy or chemotherapy presents with chief complaint of stomach pain.  Patient states  pain started today, came on suddenly, pain is around her ileostomy, pain is intermittent, last about 5 minutes then stop, describes it as a sharp-like sensation which does not radiate, she has associated nausea and vomiting, she denies any hematemesis or coffee-ground emesis, she denies  decreased output in her ileostomy, denies melena or hematochezia.  She has no associated fevers, chills, chest pain, shortness of breath, general body aches, states that she is been unable to tolerate p.o., she states her last time she ate was around 1230 today.  Patient states she is never had pain like this in the past, she denies any leaving or aggravating factors.  after  reviewing patient's chart patient had a abdominal perineal resection with end colonoscopy on 11/12 by Dr. Annie Main gross, patient was later discharged home.    Past Medical History:  Diagnosis Date   Acne    on face spirolactone for   Anal cancer ([place]) 02/25/2021   Constipation, chronic 08/08/2016   History of migraine    none in years   Wears glasses     Patient Active Problem List   Diagnosis Date Noted   Anal squamous cell carcinoma ([place]) 03/01/2021   Squamous cell carcinoma of anal canal ([place]) 02/25/2021   History of migraine    Encounter for screening colonoscopy    Rectal polyp    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 08/30/2016 10/07/2016   Tobacco abuse 08/08/2016   Chronic anal fissure s/p partial sphincterotomy 08/30/2016 08/08/2016   Constipation, chronic 08/08/2016    Past Surgical History:  Procedure  Laterality Date   ABDOMINAL HYSTERECTOMY  2016   partial   COLONOSCOPY     COLONOSCOPY WITH PROPOFOL N/A 01/28/2021   Procedure: COLONOSCOPY WITH PROPOFOL;  Surgeon: Virgel Manifold, MD;  Location: [facility];  Service: Endoscopy;  Laterality: N/A;   EXCISION HYDRADENITIS LABIA Left 02/25/2021   Procedure: EXCISION of  LABIAL LESIOM;  Surgeon: Michael Boston, MD;  Location: [place];  Service: General;  Laterality: Left;   IR FLUORO GUIDE CV LINE RIGHT  04/18/2021   OSTOMY N/A 11/03/2021   Procedure: END COLOSTOMY;  Surgeon: Michael Boston, MD;  Location: [facility];  Service: General;  Laterality: N/A;   PECTORALIS FLAP N/A 11/03/2021   Procedure: VERTICAL RECTUS ABDOMINIS MYOCUTANEOUS TO PERINEUM;  Surgeon: Irene Limbo, MD;  Location: [facility];  Service: Plastics;  Laterality: N/A;   RECTAL BIOPSY N/A 09/30/2021   Procedure: ANORECTAL MASS BIOPSY;  Surgeon: Michael Boston, MD;  Location: [facility];  Service: General;  Laterality: N/A;   RECTAL EXAM UNDER ANESTHESIA N/A 02/25/2021   Procedure: ANORECTAL EXAM UNDER ANESTHESIA;  Surgeon: Michael Boston, MD;  Location: [place];  Service: General;  Laterality: N/A;   RECTAL EXAM UNDER ANESTHESIA N/A 09/30/2021   Procedure: ANORECTAL EXAMINATION UNDER ANESTHESIA;  Surgeon: Michael Boston, MD;  Location: [facility];  Service: General;  Laterality: N/A;  GEN & LOCAL ANESTHESIA   SPHINCTEROTOMY N/A 08/30/2016   Procedure: LATERAL INTERNAL AND SPHINCTEROTOMY;  Surgeon: Michael Boston, MD;  Location: [facility];  Service: General;  Laterality: N/A;   TONSILLECTOMY  as child   TRANSANAL EXCISION OF RECTAL MASS N/A 02/25/2021   Procedure: TRANSANAL EXCISIONAL  BIOPSY OF ANAL CANAL MASS;  Surgeon: Michael Boston, MD;  Location: [place];  Service: General;  Laterality: N/A;   XI ROBOT ABDOMINAL PERINEAL RESECTION N/A 11/03/2021   Procedure: XI ROBOT ABDOMINOPERINEAL RESECTION;  Surgeon: Michael Boston, MD;  Location:  [facility];  Service: General;  Laterality: N/A;     OB History     Gravida  3   Para  3   Term      Preterm      AB      Living  3      SAB      IAB      Ectopic      Multiple      Live Births              Family History  Problem Relation Age of Onset   Breast cancer Paternal Aunt     Social History   Tobacco Use   Smoking status: Former    Packs/day: 1.00    Years: 8.00    Pack years: 8.00    Types: Cigarettes    Quit date: 02/15/2021    Years since quitting: 0.7   Smokeless tobacco: Never  Vaping Use   Vaping Use: Never used  Substance Use Topics   Alcohol use: Yes    Comment: 2 dirnks daily   Drug use: No    Home Medications Prior to Admission medications   Medication Sig Start Date End Date Taking? Authorizing Provider  Biotin w/ Vitamins C & E (HAIR SKIN & NAILS GUMMIES PO) Take 2 tablets by mouth daily. Gummies    [provider]  bisacodyl (DULCOLAX) 5 MG EC tablet Take by mouth. Patient not taking: Reported on 11/29/2021 10/19/21   [provider]  cetirizine (ZYRTEC) 10 MG tablet Take 10 mg by mouth daily.    [provider]  cyclobenzaprine (FLEXERIL) 5 MG tablet TAKE 1 TABLET BY MOUTH 3 TIMES DAILY AS NEEDED FOR MUSCLE SPASMS FOR UP TO 10 DAYS. 11/21/21   [provider]  gabapentin (NEURONTIN) 300 MG capsule Take 1 capsule (300 mg total) by mouth 3 (three) times daily for 3 days. 10/21/21 11/03/21  [name], MD  oxyCODONE (OXY IR/ROXICODONE) 5 MG immediate release tablet Take 1-2 tablets (5-10 mg total) by mouth every 6 (six) hours as needed for moderate pain, severe pain or breakthrough pain. 11/29/21   Ladell Pier, MD  spironolactone (ALDACTONE) 100 MG tablet Take 100 mg by mouth daily.    [provider]  TRIAMCINOLONE ACETONIDE, TOP, 0.05 % OINT Apply 1 application topically 4 (four) times daily as needed (eczema). 03/30/21   [provider]    Allergies     Clindamycin/lincomycin and Sulfa drugs cross reactors  Review of Systems   Review of Systems  Constitutional:  Negative for chills and fever.  HENT:  Negative for congestion.   Respiratory:  Negative for shortness of breath.   Cardiovascular:  Negative for chest pain.  Gastrointestinal:  Positive for abdominal pain, nausea and vomiting. Negative for blood in stool and diarrhea.  Genitourinary:  Negative for enuresis.  Musculoskeletal:  Negative for back pain.  Skin:  Negative for rash.  Neurological:  Negative for dizziness.  Hematological:  Does not bruise/bleed easily.   Physical Exam Updated Vital Signs BP (!) 128/94  Pulse 88    Temp 97.9 F (36.6 C) (Oral)    Resp 19    Ht 5\' 5"  (1.651 m)    Wt 67.1 kg    LMP 03/13/2012    SpO2 100%    BMI 24.63 kg/m   Physical Exam Vitals and nursing note reviewed.  Constitutional:      General: She is not in acute distress.    Appearance: She is not ill-appearing.  HENT:     Head: Normocephalic and atraumatic.     Nose: No congestion.  Eyes:     Conjunctiva/sclera: Conjunctivae normal.  Cardiovascular:     Rate and Rhythm: Normal rate and regular rhythm.     Pulses: Normal pulses.     Heart sounds: No murmur heard.   No friction rub. No gallop.  Pulmonary:     Effort: No respiratory distress.     Breath sounds: No wheezing, rhonchi or rales.  Abdominal:     Palpations: Abdomen is soft.     Tenderness: There is abdominal tenderness. There is guarding. There is no right CVA tenderness or left CVA tenderness.     Comments: Abdomen is visualized has a ileostomy present, no erythema around the ostomy itself, with output present, no melena or hematochezia present.  Abdomen nondistended dull to percussion, extremely tender to palpation which is not localized, guarding was present, no rebound tenderness noted.  No CVA tenderness present.  Musculoskeletal:     Right lower leg: No edema.     Left lower leg: No edema.  Skin:    General:  Skin is warm and dry.  Neurological:     Mental Status: She is alert.  Psychiatric:        Mood and Affect: Mood normal.    ED Results / Procedures / Treatments   Labs (all labs ordered are listed, but only abnormal results are displayed) Labs Reviewed  COMPREHENSIVE METABOLIC PANEL - Abnormal; Notable for the following components:      Result Value   Glucose, Bld 168 (*)    Total Protein 8.3 (*)    AST 62 (*)    ALT 62 (*)    All other components within normal limits  CBC WITH DIFFERENTIAL/PLATELET - Abnormal; Notable for the following components:   WBC 12.2 (*)    Neutro Abs 10.9 (*)    All other components within normal limits  URINALYSIS, ROUTINE W REFLEX MICROSCOPIC - Abnormal; Notable for the following components:   APPearance HAZY (*)    Ketones, ur 20 (*)    Protein, ur >=300 (*)    Leukocytes,Ua TRACE (*)    All other components within normal limits  RESP PANEL BY RT-PCR (FLU A&B, COVID) ARPGX2  LIPASE, BLOOD    EKG None  Radiology CT ABDOMEN PELVIS W CONTRAST  Result Date: 12/03/2021 CLINICAL DATA:  History of anal cancer resection surgery and colostomy last month, with worsening lower abdominal pain. EXAM: CT ABDOMEN AND PELVIS WITH CONTRAST TECHNIQUE: Multidetector CT imaging of the abdomen and pelvis was performed using the standard protocol following bolus administration of intravenous contrast. CONTRAST:  172mL OMNIPAQUE IOHEXOL 300 MG/ML  SOLN COMPARISON:  CT chest, abdomen and pelvis with contrast 03/07/2021. FINDINGS: Lower chest: No lung base nodule is seen. The cardiac size is normal. Calcification is again noted in the LAD coronary artery. Hepatobiliary: No focal liver abnormality is seen. No gallstones or gallbladder wall thickening. The common bile duct is more prominent than previously, currently 8 mm in  diameter previously 5 mm. Laboratory and clinical correlation advised. No ductal filling defect is visible. Pancreas: Unremarkable. Spleen:  Unremarkable. Adrenals/Urinary Tract: There is no adrenal mass. No renal mass, calculus or hydronephrosis. Somewhat thickened bladder is seen but also nondistended. Stomach/Bowel: There are new surgical changes. There has been resection of the rectosigmoid colon with output of the descending colon through a left mid abdominal colostomy 7-8 cm to left of the umbilicus. There is evidence of a prior incision right of midline which might have been the laparotomy incision and extends for several cm, postsurgical changes in the abdominal wall underlying this. The stomach and duodenum are relatively decompressed, but beginning at the jejunum there is dilatation of small bowel up to 4 cm in caliber with multiple dilated segments in the upper 2 mid abdomen. The transition is in the left lower quadrant where there is a clockwise twisting of the small bowel and mesenteric vessels on axial images 55-63 of series 2 consistent with a postoperative small bowel volvulus. Some of the dilated small bowel above this is thickened and there is mesenteric edema both above and below this level in the left abdomen, and scattered fluid in the left abdominal mesenteric folds tracking cephalad along side the spleen. No bowel pneumatosis is evident but the wall thickening in the dilated segments could indicate early ischemia. There is mild stool retention in the ascending and transverse colon. The transverse colon is quite redundant in this patient with the midportion dipping down into the pelvis including into the presacral space. There are scattered uncomplicated diverticula. Vascular/Lymphatic: Aortic atherosclerosis. No enlarged abdominal or pelvic lymph nodes. Reproductive: Uterus is absent. There previously was a 2.1 cm left ovarian cyst which is no longer seen. There is no adnexal mass. Other: As above there is scattered ascites in the left abdomen tracking into the pelvis. There is a presacral thick walled rim enhancing collection  measuring 4.4 x 2.8 by 3.9 cm (series 2 axial 79, coronal reconstruction image 76) and 10.9 Hounsfield units and does not appear connected to the bowel. This could be a seroma, hematoma, or abscess but there is no air in the collection. No pneumoperitoneum is seen. Musculoskeletal: Slight lumbar dextroscoliosis. No destructive osseous or lytic lesions. IMPRESSION: 1. High-grade small-bowel obstruction transitioning in the left lower abdomen where there is a clockwise twisting of the bowel and mesentery consistent with a postoperative volvulus or internal hernia. 2. The level of obstruction is probably in the mid ileum with decompressed small bowel in the low central abdomen and right lower quadrant. 3. Some of the small bowel above this level is thickened, with mesenteric congestive features adjacent. No pneumatosis is seen but early ischemia can't be excluded. 4. Presacral rim enhancing 4.4 x 2.8 x 3.8 cm fluid collection which could be a seroma, hematoma or abscess. There is no air in the collection. 5. Postsurgical changes with rectosigmoid resection and left mid abdominal descending colostomy. 6. Cystitis versus bladder nondistention. 7. Small amount of abdominal and pelvic ascites. 8. Prior study described a potential abnormality in the right lobe of the liver which is not seen today, and a cystic lesion of the right ovary which has resolved. 9. Mild prominence of the common bile duct increased from the prior study. Laboratory and clinical correlation advised. No focal abnormality of the gallbladder. No significant intrahepatic biliary prominence. Electronically Signed   By: Telford Nab M.D.   On: 12/03/2021 21:56    Procedures Procedures   Medications Ordered in ED Medications  piperacillin-tazobactam (ZOSYN) IVPB 3.375 g (3.375 g Intravenous New Bag/Given 12/03/21 2240)  sodium chloride 0.9 % bolus 1,000 mL (0 mLs Intravenous Stopped 12/03/21 2145)  ondansetron (ZOFRAN) injection 4 mg (4 mg  Intravenous Given 12/03/21 2020)  HYDROmorphone (DILAUDID) injection 1 mg (1 mg Intravenous Given 12/03/21 2020)  iohexol (OMNIPAQUE) 300 MG/ML solution 100 mL (100 mLs Intravenous Contrast Given 12/03/21 2109)  HYDROmorphone (DILAUDID) injection 1 mg (1 mg Intravenous Given 12/03/21 2239)    ED Course  I have reviewed the triage vital signs and the nursing notes.  Pertinent labs & imaging results that were available during my care of the patient were reviewed by me and considered in my medical decision making (see chart for details).    MDM Rules/Calculators/A&P                         Initial impression-presents with stomach pain, she is alert, no acute distress, vital signs are reassuring.  Due to her exam I am concerned for possible obstruction, will obtain lab work imaging prior pain medications reassess.  Work-up-CBC shows a leukocytosis of 12.2, CMP shows glucose of 168, protein 8.3, liver enzymes slightly elevated, lipase 23, UA shows trace leukocytes white blood cells no bacteria.  CT ab pelvis reveals high-grade small bowel obstruction consistent with postoperative volvulus or internal hernia, there is noted mesenteric congestive features possibly early ischemia present, presacral rim enhancement fluid collection could be seroma hematoma or abscess cystitis versus bowel nondistention  Reassessment-patient  reassessed pain has improved, she is not nauseous at this time has not vomited since arrival.  Vital signs remained stable, updated on imaging,  will consult with general surgery for further recommendations.  Patient was updated on recommendation from general surgery she is agreement this plan, patient was in some pain will provide her with more pain medications, add on lactic, respiratory panel, and give her fluids as well as antibiotics.  Consult spoke with local general surgeon Dr. Lysle Pearl who feels this patient needs to be transferred to [facility] for surgery.  Spoke with Dr.  Rosendo Gros of general surgery, he recommends transfer ED to ED, start her on antibiotics, fluids obtain lactic.  Spoke with Dr. Dondra Spry at [facility], ED he will accept the patient in transfer.  Rule out-low suspicion for systemic infection as patient is nontoxic-appearing, vital signs reassuring, she does have leukocytosis this is most likely secondary due to the bowel obstruction.  I have low suspicion for GI bleeding as she is hemodynamically stable, denies lightheaded or dizziness, hemoglobin is within normal limits.  I have low suspicion for UTI, [place], kidney stone she denies any urinary symptoms, UA is unremarkable for signs infection.  Plan-high-grade bowel obstruction patient will be transferred over to [facility] where she will be evaluate by the general surgery team to determine ultimate disposition.     Final Clinical Impression(s) / ED Diagnoses Final diagnoses:  Small bowel obstruction [facility])    Rx / DC Orders ED Discharge Orders     None        Marcello Fennel, PA-C 12/03/21 2256    Daleen Bo, MD 12/04/21 1735

## 2021-12-04 ENCOUNTER — Emergency Department (HOSPITAL_COMMUNITY): Payer: BC Managed Care – PPO | Admitting: Anesthesiology

## 2021-12-04 ENCOUNTER — Other Ambulatory Visit: Payer: Self-pay

## 2021-12-04 ENCOUNTER — Encounter (HOSPITAL_COMMUNITY): Payer: Self-pay | Admitting: Anesthesiology

## 2021-12-04 DIAGNOSIS — K56609 Unspecified intestinal obstruction, unspecified as to partial versus complete obstruction: Secondary | ICD-10-CM | POA: Diagnosis present

## 2021-12-04 DIAGNOSIS — Z87891 Personal history of nicotine dependence: Secondary | ICD-10-CM | POA: Diagnosis not present

## 2021-12-04 DIAGNOSIS — Z881 Allergy status to other antibiotic agents status: Secondary | ICD-10-CM | POA: Diagnosis not present

## 2021-12-04 DIAGNOSIS — R188 Other ascites: Secondary | ICD-10-CM | POA: Diagnosis present

## 2021-12-04 DIAGNOSIS — D72829 Elevated white blood cell count, unspecified: Secondary | ICD-10-CM | POA: Diagnosis present

## 2021-12-04 DIAGNOSIS — Z20822 Contact with and (suspected) exposure to covid-19: Secondary | ICD-10-CM | POA: Diagnosis present

## 2021-12-04 DIAGNOSIS — K460 Unspecified abdominal hernia with obstruction, without gangrene: Secondary | ICD-10-CM | POA: Diagnosis present

## 2021-12-04 DIAGNOSIS — K567 Ileus, unspecified: Secondary | ICD-10-CM | POA: Diagnosis present

## 2021-12-04 DIAGNOSIS — Z933 Colostomy status: Secondary | ICD-10-CM | POA: Diagnosis not present

## 2021-12-04 DIAGNOSIS — C211 Malignant neoplasm of anal canal: Secondary | ICD-10-CM | POA: Diagnosis present

## 2021-12-04 DIAGNOSIS — Z882 Allergy status to sulfonamides status: Secondary | ICD-10-CM | POA: Diagnosis not present

## 2021-12-04 DIAGNOSIS — K660 Peritoneal adhesions (postprocedural) (postinfection): Secondary | ICD-10-CM | POA: Diagnosis present

## 2021-12-04 DIAGNOSIS — Z9889 Other specified postprocedural states: Secondary | ICD-10-CM

## 2021-12-04 LAB — BASIC METABOLIC PANEL
Anion gap: 8 (ref 5–15)
BUN: 9 mg/dL (ref 6–20)
CO2: 23 mmol/L (ref 22–32)
Calcium: 9 mg/dL (ref 8.9–10.3)
Chloride: 107 mmol/L (ref 98–111)
Creatinine, Ser: 0.73 mg/dL (ref 0.44–1.00)
GFR, Estimated: >60 mL/min (ref >60)
Glucose, Bld: 170 mg/dL — ABNORMAL HIGH (ref 70–99)
Potassium: 3.9 mmol/L (ref 3.5–5.1)
Sodium: 138 mmol/L (ref 135–145)

## 2021-12-04 LAB — CBC
HCT: 35.4 % — ABNORMAL LOW (ref 36.0–46.0)
Hemoglobin: 11.6 g/dL — ABNORMAL LOW (ref 12.0–15.0)
MCH: 32.5 pg (ref 26.0–34.0)
MCHC: 32.8 g/dL (ref 30.0–36.0)
MCV: 99.2 fL (ref 80.0–100.0)
Platelets: 272 10*3/uL (ref 150–400)
RBC: 3.57 MIL/uL — ABNORMAL LOW (ref 3.87–5.11)
RDW: 13.3 % (ref 11.5–15.5)
WBC: 16 10*3/uL — ABNORMAL HIGH (ref 4.0–10.5)
nRBC: 0 % (ref 0.0–0.2)

## 2021-12-04 MED ORDER — ROCURONIUM BROMIDE 100 MG/10ML IV SOLN
INTRAVENOUS | Status: DC | PRN
  Administered 2021-12-04: 40 mg via INTRAVENOUS

## 2021-12-04 MED ORDER — SPIRONOLACTONE 100 MG PO TABS
100.0000 mg | ORAL_TABLET | Freq: Every day | ORAL | Status: DC
  Administered 2021-12-04 – 2021-12-08 (×5): 100 mg via ORAL
  Filled 2021-12-04 (×5): qty 1

## 2021-12-04 MED ORDER — HYDROMORPHONE HCL 1 MG/ML IJ SOLN
0.5000 mg | INTRAMUSCULAR | Status: DC | PRN
  Administered 2021-12-04: 05:00:00 0.5 mg via INTRAVENOUS
  Filled 2021-12-04: qty 0.5

## 2021-12-04 MED ORDER — FENTANYL CITRATE (PF) 100 MCG/2ML IJ SOLN
INTRAMUSCULAR | Status: DC | PRN
  Administered 2021-12-04: 50 ug via INTRAVENOUS
  Administered 2021-12-04: 100 ug via INTRAVENOUS
  Administered 2021-12-04: 25 ug via INTRAVENOUS
  Administered 2021-12-04: 50 ug via INTRAVENOUS
  Administered 2021-12-04: 25 ug via INTRAVENOUS

## 2021-12-04 MED ORDER — PHENYLEPHRINE HCL-NACL 20-0.9 MG/250ML-% IV SOLN
INTRAVENOUS | Status: DC | PRN
  Administered 2021-12-04: 50 ug/min via INTRAVENOUS

## 2021-12-04 MED ORDER — PROPOFOL 10 MG/ML IV BOLUS
INTRAVENOUS | Status: DC | PRN
  Administered 2021-12-04: 150 mg via INTRAVENOUS
  Administered 2021-12-04: 50 mg via INTRAVENOUS

## 2021-12-04 MED ORDER — PROPOFOL 10 MG/ML IV BOLUS
INTRAVENOUS | Status: AC
  Filled 2021-12-04: qty 20

## 2021-12-04 MED ORDER — DEXAMETHASONE SODIUM PHOSPHATE 4 MG/ML IJ SOLN
INTRAMUSCULAR | Status: DC | PRN
  Administered 2021-12-04: 10 mg via INTRAVENOUS

## 2021-12-04 MED ORDER — SUGAMMADEX SODIUM 200 MG/2ML IV SOLN
INTRAVENOUS | Status: DC | PRN
  Administered 2021-12-04: 200 mg via INTRAVENOUS

## 2021-12-04 MED ORDER — HYDROMORPHONE HCL 1 MG/ML IJ SOLN
INTRAMUSCULAR | Status: AC
  Filled 2021-12-04: qty 1

## 2021-12-04 MED ORDER — METHOCARBAMOL 500 MG PO TABS: 500.0000 mg | ORAL_TABLET | Freq: Four times a day (QID) | ORAL | Status: DC | PRN

## 2021-12-04 MED ORDER — KETOROLAC TROMETHAMINE 30 MG/ML IJ SOLN
30.0000 mg | Freq: Four times a day (QID) | INTRAMUSCULAR | Status: DC
  Administered 2021-12-04 – 2021-12-08 (×16): 30 mg via INTRAVENOUS
  Filled 2021-12-04 (×17): qty 1

## 2021-12-04 MED ORDER — ACETAMINOPHEN 325 MG PO TABS
650.0000 mg | ORAL_TABLET | Freq: Four times a day (QID) | ORAL | Status: DC
  Administered 2021-12-04 – 2021-12-08 (×16): 650 mg via ORAL
  Filled 2021-12-04 (×16): qty 2

## 2021-12-04 MED ORDER — FENTANYL CITRATE (PF) 100 MCG/2ML IJ SOLN
INTRAMUSCULAR | Status: AC
  Filled 2021-12-04: qty 2

## 2021-12-04 MED ORDER — MIDAZOLAM HCL 2 MG/2ML IJ SOLN
INTRAMUSCULAR | Status: AC
  Filled 2021-12-04: qty 2

## 2021-12-04 MED ORDER — DEXTROSE-NACL 5-0.9 % IV SOLN: INTRAVENOUS | Status: DC

## 2021-12-04 MED ORDER — OXYCODONE HCL 5 MG PO TABS: 5.0000 mg | ORAL_TABLET | Freq: Once | ORAL | Status: DC | PRN

## 2021-12-04 MED ORDER — OXYCODONE HCL 5 MG PO TABS
5.0000 mg | ORAL_TABLET | Freq: Four times a day (QID) | ORAL | Status: DC | PRN
  Administered 2021-12-04 – 2021-12-07 (×10): 10 mg via ORAL
  Filled 2021-12-04 (×12): qty 2

## 2021-12-04 MED ORDER — ONDANSETRON HCL 4 MG/2ML IJ SOLN
INTRAMUSCULAR | Status: DC | PRN
  Administered 2021-12-04: 4 mg via INTRAVENOUS

## 2021-12-04 MED ORDER — FENTANYL CITRATE (PF) 100 MCG/2ML IJ SOLN
25.0000 ug | INTRAMUSCULAR | Status: DC | PRN
  Administered 2021-12-04 (×2): 50 ug via INTRAVENOUS

## 2021-12-04 MED ORDER — SUCCINYLCHOLINE CHLORIDE 200 MG/10ML IV SOSY
PREFILLED_SYRINGE | INTRAVENOUS | Status: DC | PRN
  Administered 2021-12-04: 120 mg via INTRAVENOUS

## 2021-12-04 MED ORDER — MIDAZOLAM HCL 5 MG/5ML IJ SOLN
INTRAMUSCULAR | Status: DC | PRN
  Administered 2021-12-04 (×2): 1 mg via INTRAVENOUS

## 2021-12-04 MED ORDER — OXYCODONE HCL 5 MG/5ML PO SOLN: 5.0000 mg | Freq: Once | ORAL | Status: DC | PRN

## 2021-12-04 MED ORDER — ONDANSETRON 4 MG PO TBDP: 4.0000 mg | ORAL_TABLET | Freq: Four times a day (QID) | ORAL | Status: DC | PRN

## 2021-12-04 MED ORDER — ONDANSETRON HCL 4 MG/2ML IJ SOLN: 4.0000 mg | Freq: Four times a day (QID) | INTRAMUSCULAR | Status: DC | PRN

## 2021-12-04 MED ORDER — FENTANYL CITRATE (PF) 250 MCG/5ML IJ SOLN
INTRAMUSCULAR | Status: AC
  Filled 2021-12-04: qty 5

## 2021-12-04 MED ORDER — GABAPENTIN 300 MG PO CAPS
300.0000 mg | ORAL_CAPSULE | Freq: Three times a day (TID) | ORAL | Status: DC
  Administered 2021-12-04 – 2021-12-08 (×13): 300 mg via ORAL
  Filled 2021-12-04 (×13): qty 1

## 2021-12-04 MED ORDER — LIDOCAINE HCL (CARDIAC) PF 100 MG/5ML IV SOSY
PREFILLED_SYRINGE | INTRAVENOUS | Status: DC | PRN
  Administered 2021-12-04: 50 mg via INTRAVENOUS

## 2021-12-04 MED ORDER — ENOXAPARIN SODIUM 40 MG/0.4ML IJ SOSY
40.0000 mg | PREFILLED_SYRINGE | INTRAMUSCULAR | Status: DC
  Administered 2021-12-05 – 2021-12-08 (×4): 40 mg via SUBCUTANEOUS
  Filled 2021-12-04 (×4): qty 0.4

## 2021-12-04 MED ORDER — HYDROMORPHONE HCL 1 MG/ML IJ SOLN
0.2500 mg | INTRAMUSCULAR | Status: DC | PRN
  Administered 2021-12-04 (×2): 0.5 mg via INTRAVENOUS

## 2021-12-04 MED ORDER — LACTATED RINGERS IV SOLN: INTRAVENOUS | Status: DC | PRN

## 2021-12-04 NOTE — Anesthesia Procedure Notes (Signed)
Procedure Name: Intubation Date/Time: 12/04/2021 12:21 AM Performed by: Suzy Bouchard, CRNA Pre-anesthesia Checklist: Patient identified, Emergency Drugs available, Suction available, Patient being monitored and Timeout performed Patient Re-evaluated:Patient Re-evaluated prior to induction Oxygen Delivery Method: Circle system utilized Preoxygenation: Pre-oxygenation with 100% oxygen Induction Type: IV induction, Rapid sequence and Cricoid Pressure applied Laryngoscope Size: Miller and 2 Grade View: Grade I Tube type: Oral Tube size: 7.5 mm Number of attempts: 1 Airway Equipment and Method: Stylet Placement Confirmation: ETT inserted through vocal cords under direct vision, positive ETCO2 and breath sounds checked- equal and bilateral Secured at: 23 cm Tube secured with: Tape Dental Injury: Teeth and Oropharynx as per pre-operative assessment

## 2021-12-04 NOTE — H&P (Signed)
Beth [name] is an 50 y.o. female.   Chief Complaint: Abdominal pain HPI: Patient is a 50 year old female who recently underwent robotic APR, with end colostomy, V RAM flap approximate 1 month ago today.  Patient did well postoperatively.  Patient states that she began having pain Saturday a.m. upon waking.  She states that the pain continued to become progressively worse and more frequent.  She states the pain was deep to her colostomy site.  Patient states that she had some nausea vomiting as the pain progressed.  Patient denies any hematemesis.  Patient denies any blood in her stool.  She does state that the stool continued.  Patient was seen at an outside hospital.  Patient went CT scan.  CT scan was significant for volvulus versus possible internal hernia with questionable ischemic bowel.Patient also with leukocytosis.  I did review the CT scan and laboratory studies personally.  Patient was transferred to [facility] for further evaluation and management.    Past Medical History:  Diagnosis Date   Acne    on face spirolactone for   Anal cancer ([place]) 02/25/2021   Constipation, chronic 08/08/2016   History of migraine    none in years   Wears glasses     Past Surgical History:  Procedure Laterality Date   ABDOMINAL HYSTERECTOMY  2016   partial   COLONOSCOPY     COLONOSCOPY WITH PROPOFOL N/A 01/28/2021   Procedure: COLONOSCOPY WITH PROPOFOL;  Surgeon: Virgel Manifold, MD;  Location: [facility];  Service: Endoscopy;  Laterality: N/A;   EXCISION HYDRADENITIS LABIA Left 02/25/2021   Procedure: EXCISION of  LABIAL LESIOM;  Surgeon: Michael Boston, MD;  Location: [place];  Service: General;  Laterality: Left;   IR FLUORO GUIDE CV LINE RIGHT  04/18/2021   OSTOMY N/A 11/03/2021   Procedure: END COLOSTOMY;  Surgeon: Michael Boston, MD;  Location: [facility];  Service: General;  Laterality: N/A;   PECTORALIS FLAP N/A 11/03/2021   Procedure: VERTICAL RECTUS  ABDOMINIS MYOCUTANEOUS TO PERINEUM;  Surgeon: Irene Limbo, MD;  Location: [facility];  Service: Plastics;  Laterality: N/A;   RECTAL BIOPSY N/A 09/30/2021   Procedure: ANORECTAL MASS BIOPSY;  Surgeon: Michael Boston, MD;  Location: [facility];  Service: General;  Laterality: N/A;   RECTAL EXAM UNDER ANESTHESIA N/A 02/25/2021   Procedure: ANORECTAL EXAM UNDER ANESTHESIA;  Surgeon: Michael Boston, MD;  Location: [place];  Service: General;  Laterality: N/A;   RECTAL EXAM UNDER ANESTHESIA N/A 09/30/2021   Procedure: ANORECTAL EXAMINATION UNDER ANESTHESIA;  Surgeon: Michael Boston, MD;  Location: [facility];  Service: General;  Laterality: N/A;  GEN & LOCAL ANESTHESIA   SPHINCTEROTOMY N/A 08/30/2016   Procedure: LATERAL INTERNAL AND SPHINCTEROTOMY;  Surgeon: Michael Boston, MD;  Location: [facility];  Service: General;  Laterality: N/A;   TONSILLECTOMY  as child   TRANSANAL EXCISION OF RECTAL MASS N/A 02/25/2021   Procedure: TRANSANAL EXCISIONAL  BIOPSY OF ANAL CANAL MASS;  Surgeon: Michael Boston, MD;  Location: [place];  Service: General;  Laterality: N/A;   XI ROBOT ABDOMINAL PERINEAL RESECTION N/A 11/03/2021   Procedure: XI ROBOT ABDOMINOPERINEAL RESECTION;  Surgeon: Michael Boston, MD;  Location: [facility];  Service: General;  Laterality: N/A;    Family History  Problem Relation Age of Onset   Breast cancer Paternal Aunt    Social History:  reports that she quit smoking about 9 months ago. Her smoking use included cigarettes. She has a 8.00  pack-year smoking history. She has never used smokeless tobacco. She reports current alcohol use. She reports that she does not use drugs.  Allergies:  Allergies  Allergen Reactions   Clindamycin/Lincomycin Rash   Sulfa Drugs Cross Reactors Itching and Rash    (Not in a hospital admission)   Results for orders placed or performed during the hospital encounter of 12/03/21 (from the past 48 hour(s))  Comprehensive metabolic panel      Status: Abnormal   Collection Time: 12/03/21  7:25 PM  Result Value Ref Range   Sodium 137 135 - 145 mmol/L   Potassium 3.5 3.5 - 5.1 mmol/L   Chloride 100 98 - 111 mmol/L   CO2 22 22 - 32 mmol/L   Glucose, Bld 168 (H) 70 - 99 mg/dL    Comment: Glucose reference range applies only to samples taken after fasting for at least 8 hours.   BUN 11 6 - 20 mg/dL   Creatinine, Ser 0.56 0.44 - 1.00 mg/dL   Calcium 9.9 8.9 - 10.3 mg/dL   Total Protein 8.3 (H) 6.5 - 8.1 g/dL   Albumin 4.3 3.5 - 5.0 g/dL   AST 62 (H) 15 - 41 U/L   ALT 62 (H) 0 - 44 U/L   Alkaline Phosphatase 91 38 - 126 U/L   Total Bilirubin 0.8 0.3 - 1.2 mg/dL   GFR, Estimated >60 >60 mL/min    Comment: (NOTE) Calculated using the CKD-EPI Creatinine Equation (2021)    Anion gap 15 5 - 15    Comment: Performed at [facility], [address] [place], [place] [address]  Lipase, blood     Status: None   Collection Time: 12/03/21  7:25 PM  Result Value Ref Range   Lipase 23 11 - 51 U/L    Comment: Performed at [facility], [address], [place], [place] [address]  CBC with Differential     Status: Abnormal   Collection Time: 12/03/21  7:25 PM  Result Value Ref Range   WBC 12.2 (H) 4.0 - 10.5 K/uL   RBC 4.17 3.87 - 5.11 MIL/uL   Hemoglobin 13.6 12.0 - 15.0 g/dL   HCT 41.1 36.0 - 46.0 %   MCV 98.6 80.0 - 100.0 fL   MCH 32.6 26.0 - 34.0 pg   MCHC 33.1 30.0 - 36.0 g/dL   RDW 13.3 11.5 - 15.5 %   Platelets 359 150 - 400 K/uL   nRBC 0.0 0.0 - 0.2 %   Neutrophils Relative % 88 %   Neutro Abs 10.9 (H) 1.7 - 7.7 K/uL   Lymphocytes Relative 6 %   Lymphs Abs 0.7 0.7 - 4.0 K/uL   Monocytes Relative 4 %   Monocytes Absolute 0.4 0.1 - 1.0 K/uL   Eosinophils Relative 1 %   Eosinophils Absolute 0.1 0.0 - 0.5 K/uL   Basophils Relative 0 %   Basophils Absolute 0.0 0.0 - 0.1 K/uL   Immature Granulocytes 1 %   Abs Immature Granulocytes 0.07 0.00 - 0.07 K/uL    Comment: Performed at [facility], [address], [place],  [place] [address]  Urinalysis, Routine w reflex microscopic     Status: Abnormal   Collection Time: 12/03/21  8:09 PM  Result Value Ref Range   Color, Urine YELLOW YELLOW   APPearance HAZY (A) CLEAR   Specific Gravity, Urine 1.028 1.005 - 1.030   pH 8.0 5.0 - 8.0   Glucose, UA NEGATIVE NEGATIVE mg/dL   Hgb urine dipstick NEGATIVE  NEGATIVE   Bilirubin Urine NEGATIVE NEGATIVE   Ketones, ur 20 (A) NEGATIVE mg/dL   Protein, ur >=300 (A) NEGATIVE mg/dL   Nitrite NEGATIVE NEGATIVE   Leukocytes,Ua TRACE (A) NEGATIVE   RBC / HPF 0-5 0 - 5 RBC/hpf   WBC, UA 11-20 0 - 5 WBC/hpf   Bacteria, UA NONE SEEN NONE SEEN   Squamous Epithelial / LPF 0-5 0 - 5   Mucus PRESENT    Amorphous Crystal PRESENT     Comment: Performed at [facility], [address], [place], [place] [address]  Resp Panel by RT-PCR (Flu A&B, Covid) Nasopharyngeal Swab     Status: None   Collection Time: 12/03/21 10:02 PM   Specimen: Nasopharyngeal Swab; Nasopharyngeal(NP) swabs in vial transport medium  Result Value Ref Range   SARS Coronavirus 2 by RT PCR NEGATIVE NEGATIVE    Comment: (NOTE) SARS-CoV-2 target nucleic acids are NOT DETECTED.  The SARS-CoV-2 RNA is generally detectable in upper respiratory specimens during the acute phase of infection. The lowest concentration of SARS-CoV-2 viral copies this assay can detect is 138 copies/mL. A negative result does not preclude SARS-Cov-2 infection and should not be used as the sole basis for treatment or other patient management decisions. A negative result may occur with  improper specimen collection/handling, submission of specimen other than nasopharyngeal swab, presence of viral mutation(s) within the areas targeted by this assay, and inadequate number of viral copies(<138 copies/mL). A negative result must be combined with clinical observations, patient history, and epidemiological information. The expected result is Negative.  Fact Sheet for Patients:   EntrepreneurPulse.com.au  Fact Sheet for Healthcare Providers:  IncredibleEmployment.be  This test is no t yet approved or cleared by the [place] FDA and  has been authorized for detection and/or diagnosis of SARS-CoV-2 by FDA under an Emergency Use Authorization (EUA). This EUA will remain  in effect (meaning this test can be used) for the duration of the COVID-19 declaration under Section 564(b)(1) of the Act, 21 U.S.C.section 360bbb-3(b)(1), unless the authorization is terminated  or revoked sooner.       Influenza A by PCR NEGATIVE NEGATIVE   Influenza B by PCR NEGATIVE NEGATIVE    Comment: (NOTE) The Xpert Xpress SARS-CoV-2/FLU/RSV plus assay is intended as an aid in the diagnosis of influenza from Nasopharyngeal swab specimens and should not be used as a sole basis for treatment. Nasal washings and aspirates are unacceptable for Xpert Xpress SARS-CoV-2/FLU/RSV testing.  Fact Sheet for Patients: EntrepreneurPulse.com.au  Fact Sheet for Healthcare Providers: IncredibleEmployment.be  This test is not yet approved or cleared by the [place] FDA and has been authorized for detection and/or diagnosis of SARS-CoV-2 by FDA under an Emergency Use Authorization (EUA). This EUA will remain in effect (meaning this test can be used) for the duration of the COVID-19 declaration under Section 564(b)(1) of the Act, 21 U.S.C. section 360bbb-3(b)(1), unless the authorization is terminated or revoked.  Performed at [facility], [address] [place], [place] [address]    CT ABDOMEN PELVIS W CONTRAST  Result Date: 12/03/2021 CLINICAL DATA:  History of anal cancer resection surgery and colostomy last month, with worsening lower abdominal pain. EXAM: CT ABDOMEN AND PELVIS WITH CONTRAST TECHNIQUE: Multidetector CT imaging of the abdomen and pelvis was performed using the standard protocol following bolus  administration of intravenous contrast. CONTRAST:  120mL OMNIPAQUE IOHEXOL 300 MG/ML  SOLN COMPARISON:  CT chest, abdomen and pelvis with contrast 03/07/2021. FINDINGS: Lower chest: No lung base nodule  is seen. The cardiac size is normal. Calcification is again noted in the LAD coronary artery. Hepatobiliary: No focal liver abnormality is seen. No gallstones or gallbladder wall thickening. The common bile duct is more prominent than previously, currently 8 mm in diameter previously 5 mm. Laboratory and clinical correlation advised. No ductal filling defect is visible. Pancreas: Unremarkable. Spleen: Unremarkable. Adrenals/Urinary Tract: There is no adrenal mass. No renal mass, calculus or hydronephrosis. Somewhat thickened bladder is seen but also nondistended. Stomach/Bowel: There are new surgical changes. There has been resection of the rectosigmoid colon with output of the descending colon through a left mid abdominal colostomy 7-8 cm to left of the umbilicus. There is evidence of a prior incision right of midline which might have been the laparotomy incision and extends for several cm, postsurgical changes in the abdominal wall underlying this. The stomach and duodenum are relatively decompressed, but beginning at the jejunum there is dilatation of small bowel up to 4 cm in caliber with multiple dilated segments in the upper 2 mid abdomen. The transition is in the left lower quadrant where there is a clockwise twisting of the small bowel and mesenteric vessels on axial images 55-63 of series 2 consistent with a postoperative small bowel volvulus. Some of the dilated small bowel above this is thickened and there is mesenteric edema both above and below this level in the left abdomen, and scattered fluid in the left abdominal mesenteric folds tracking cephalad along side the spleen. No bowel pneumatosis is evident but the wall thickening in the dilated segments could indicate early ischemia. There is mild stool  retention in the ascending and transverse colon. The transverse colon is quite redundant in this patient with the midportion dipping down into the pelvis including into the presacral space. There are scattered uncomplicated diverticula. Vascular/Lymphatic: Aortic atherosclerosis. No enlarged abdominal or pelvic lymph nodes. Reproductive: Uterus is absent. There previously was a 2.1 cm left ovarian cyst which is no longer seen. There is no adnexal mass. Other: As above there is scattered ascites in the left abdomen tracking into the pelvis. There is a presacral thick walled rim enhancing collection measuring 4.4 x 2.8 by 3.9 cm (series 2 axial 79, coronal reconstruction image 76) and 10.9 Hounsfield units and does not appear connected to the bowel. This could be a seroma, hematoma, or abscess but there is no air in the collection. No pneumoperitoneum is seen. Musculoskeletal: Slight lumbar dextroscoliosis. No destructive osseous or lytic lesions. IMPRESSION: 1. High-grade small-bowel obstruction transitioning in the left lower abdomen where there is a clockwise twisting of the bowel and mesentery consistent with a postoperative volvulus or internal hernia. 2. The level of obstruction is probably in the mid ileum with decompressed small bowel in the low central abdomen and right lower quadrant. 3. Some of the small bowel above this level is thickened, with mesenteric congestive features adjacent. No pneumatosis is seen but early ischemia can't be excluded. 4. Presacral rim enhancing 4.4 x 2.8 x 3.8 cm fluid collection which could be a seroma, hematoma or abscess. There is no air in the collection. 5. Postsurgical changes with rectosigmoid resection and left mid abdominal descending colostomy. 6. Cystitis versus bladder nondistention. 7. Small amount of abdominal and pelvic ascites. 8. Prior study described a potential abnormality in the right lobe of the liver which is not seen today, and a cystic lesion of the right  ovary which has resolved. 9. Mild prominence of the common bile duct increased from the prior study.  Laboratory and clinical correlation advised. No focal abnormality of the gallbladder. No significant intrahepatic biliary prominence. Electronically Signed   By: Telford Nab M.D.   On: 12/03/2021 21:56    Review of Systems  Constitutional:  Negative for chills and fever.  HENT:  Negative for ear discharge, hearing loss and sore throat.   Eyes:  Negative for discharge.  Respiratory:  Negative for cough and shortness of breath.   Cardiovascular:  Negative for chest pain and leg swelling.  Gastrointestinal:  Positive for abdominal pain, nausea and vomiting. Negative for constipation and diarrhea.  Musculoskeletal:  Negative for myalgias and neck pain.  Skin:  Negative for rash.  Allergic/Immunologic: Negative for environmental allergies.  Neurological:  Negative for dizziness and seizures.  Hematological:  Does not bruise/bleed easily.  Psychiatric/Behavioral:  Negative for suicidal ideas.   All other systems reviewed and are negative.  Blood pressure (!) 128/94, pulse 88, temperature 97.9 F (36.6 C), temperature source Oral, resp. rate 19, height 5\' 5"  (1.651 m), weight 67.1 kg, last menstrual period 03/13/2012, SpO2 100 %. Physical Exam Constitutional:      Appearance: She is well-developed.     Comments: Conversant No acute distress  HENT:     Head: Normocephalic and atraumatic.  Eyes:     General: Lids are normal. No scleral icterus.    Pupils: Pupils are equal, round, and reactive to light.     Comments: Pupils are equal round and reactive No lid lag Moist conjunctiva  Neck:     Thyroid: No thyromegaly.     Trachea: No tracheal tenderness.     Comments: No cervical lymphadenopathy Cardiovascular:     Rate and Rhythm: Normal rate and regular rhythm.     Heart sounds: No murmur heard. Pulmonary:     Effort: Pulmonary effort is normal.     Breath sounds: Normal breath  sounds. No wheezing or rales.  Abdominal:     Tenderness: There is abdominal tenderness (LLQ). There is guarding. There is no rebound.     Hernia: No hernia is present.  Musculoskeletal:     Cervical back: Normal range of motion and neck supple.  Skin:    General: Skin is warm.     Findings: No rash.     Nails: There is no clubbing.     Comments: Normal skin turgor  Neurological:     Mental Status: She is alert and oriented to person, place, and time.     Comments: Normal gait and station  Psychiatric:        Mood and Affect: Mood normal.        Thought Content: Thought content normal.        Judgment: Judgment normal.     Comments: Appropriate affect     Assessment/Plan 50 year old female with questionable small bowel volvulus versus internal hernia resulting in possible ischemic bowel.  Patient is status post robotic APR with end colostomy and VRAM flap.  Secondary patient's signs of peritonitis and findings on CT scan we will proceed to the operating for exploratory laparotomy.  I discussed the CT scan findings and the relevance the in detail with the patient.  I discussed with the patient the risks benefits of the procedure to include but not limited to: Infection, bleeding, damage surrounding structures, possible need for further surgery.  Patient voiced understanding wish to proceed.  Ralene Ok, MD 12/04/2021, 12:08 AM

## 2021-12-04 NOTE — Transfer of Care (Signed)
Immediate Anesthesia Transfer of Care Note  Patient: Beth [name]  Procedure(s) Performed: EXPLORATORY LAPAROTOMY, LYSIS OF ADHESIONS (Abdomen)  Patient Location: PACU  Anesthesia Type:General  Level of Consciousness: awake and alert   Airway & Oxygen Therapy: Patient Spontanous Breathing and Patient connected to nasal cannula oxygen  Post-op Assessment: Report given to RN and Post -op Vital signs reviewed and stable  Post vital signs: Reviewed and stable  Last Vitals:  Vitals Value Taken Time  BP 171/91 12/04/21 0124  Temp    Pulse 77 12/04/21 0125  Resp 15 12/04/21 0125  SpO2 100 % 12/04/21 0125  Vitals shown include unvalidated device data.  Last Pain:  Vitals:   12/04/21 0124  TempSrc:   PainSc: 10-Worst pain ever         Complications: No notable events documented.

## 2021-12-04 NOTE — Progress Notes (Signed)
1 Day Post-Op   Subjective/Chief Complaint: Abdomen is sore No nausea or vomiting Foley still in place   Objective: Vital signs in last 24 hours: Temp:  [97.2 F (36.2 C)-98.2 F (36.8 C)] 98.2 F (36.8 C) (12/18 0838) Pulse Rate:  [70-96] 71 (12/18 0838) Resp:  [8-25] 18 (12/18 0838) BP: (128-173)/(80-104) 130/80 (12/18 0838) SpO2:  [94 %-100 %] 98 % (12/18 0838) Weight:  [67.1 kg] 67.1 kg (12/17 1853)    Intake/Output from previous day: 12/17 0701 - 12/18 0700 In: 1187.3 [I.V.:1187.3] Out: 410 [Urine:160; Blood:50] Intake/Output this shift: No intake/output data recorded.  General appearance: alert, cooperative, and no distress GI: Incisional tenderness Honeycomb - minimal bloody drainage; colostomy pink and viable  Lab Results:  Recent Labs    12/03/21 1925 12/04/21 0326  WBC 12.2* 16.0*  HGB 13.6 11.6*  HCT 41.1 35.4*  PLT 359 272   BMET Recent Labs    12/03/21 1925 12/04/21 0326  NA 137 138  K 3.5 3.9  CL 100 107  CO2 22 23  GLUCOSE 168* 170*  BUN 11 9  CREATININE 0.56 0.73  CALCIUM 9.9 9.0   PT/INR No results for input(s): LABPROT, INR in the last 72 hours. ABG No results for input(s): PHART, HCO3 in the last 72 hours.  Invalid input(s): PCO2, PO2  Studies/Results: CT ABDOMEN PELVIS W CONTRAST  Result Date: 12/03/2021 CLINICAL DATA:  History of anal cancer resection surgery and colostomy last month, with worsening lower abdominal pain. EXAM: CT ABDOMEN AND PELVIS WITH CONTRAST TECHNIQUE: Multidetector CT imaging of the abdomen and pelvis was performed using the standard protocol following bolus administration of intravenous contrast. CONTRAST:  164mL OMNIPAQUE IOHEXOL 300 MG/ML  SOLN COMPARISON:  CT chest, abdomen and pelvis with contrast 03/07/2021. FINDINGS: Lower chest: No lung base nodule is seen. The cardiac size is normal. Calcification is again noted in the LAD coronary artery. Hepatobiliary: No focal liver abnormality is seen. No  gallstones or gallbladder wall thickening. The common bile duct is more prominent than previously, currently 8 mm in diameter previously 5 mm. Laboratory and clinical correlation advised. No ductal filling defect is visible. Pancreas: Unremarkable. Spleen: Unremarkable. Adrenals/Urinary Tract: There is no adrenal mass. No renal mass, calculus or hydronephrosis. Somewhat thickened bladder is seen but also nondistended. Stomach/Bowel: There are new surgical changes. There has been resection of the rectosigmoid colon with output of the descending colon through a left mid abdominal colostomy 7-8 cm to left of the umbilicus. There is evidence of a prior incision right of midline which might have been the laparotomy incision and extends for several cm, postsurgical changes in the abdominal wall underlying this. The stomach and duodenum are relatively decompressed, but beginning at the jejunum there is dilatation of small bowel up to 4 cm in caliber with multiple dilated segments in the upper 2 mid abdomen. The transition is in the left lower quadrant where there is a clockwise twisting of the small bowel and mesenteric vessels on axial images 55-63 of series 2 consistent with a postoperative small bowel volvulus. Some of the dilated small bowel above this is thickened and there is mesenteric edema both above and below this level in the left abdomen, and scattered fluid in the left abdominal mesenteric folds tracking cephalad along side the spleen. No bowel pneumatosis is evident but the wall thickening in the dilated segments could indicate early ischemia. There is mild stool retention in the ascending and transverse colon. The transverse colon is quite redundant in  this patient with the midportion dipping down into the pelvis including into the presacral space. There are scattered uncomplicated diverticula. Vascular/Lymphatic: Aortic atherosclerosis. No enlarged abdominal or pelvic lymph nodes. Reproductive: Uterus is  absent. There previously was a 2.1 cm left ovarian cyst which is no longer seen. There is no adnexal mass. Other: As above there is scattered ascites in the left abdomen tracking into the pelvis. There is a presacral thick walled rim enhancing collection measuring 4.4 x 2.8 by 3.9 cm (series 2 axial 79, coronal reconstruction image 76) and 10.9 Hounsfield units and does not appear connected to the bowel. This could be a seroma, hematoma, or abscess but there is no air in the collection. No pneumoperitoneum is seen. Musculoskeletal: Slight lumbar dextroscoliosis. No destructive osseous or lytic lesions. IMPRESSION: 1. High-grade small-bowel obstruction transitioning in the left lower abdomen where there is a clockwise twisting of the bowel and mesentery consistent with a postoperative volvulus or internal hernia. 2. The level of obstruction is probably in the mid ileum with decompressed small bowel in the low central abdomen and right lower quadrant. 3. Some of the small bowel above this level is thickened, with mesenteric congestive features adjacent. No pneumatosis is seen but early ischemia can't be excluded. 4. Presacral rim enhancing 4.4 x 2.8 x 3.8 cm fluid collection which could be a seroma, hematoma or abscess. There is no air in the collection. 5. Postsurgical changes with rectosigmoid resection and left mid abdominal descending colostomy. 6. Cystitis versus bladder nondistention. 7. Small amount of abdominal and pelvic ascites. 8. Prior study described a potential abnormality in the right lobe of the liver which is not seen today, and a cystic lesion of the right ovary which has resolved. 9. Mild prominence of the common bile duct increased from the prior study. Laboratory and clinical correlation advised. No focal abnormality of the gallbladder. No significant intrahepatic biliary prominence. Electronically Signed   By: Telford Nab M.D.   On: 12/03/2021 21:56    Anti-infectives: Anti-infectives (From  admission, onward)    Start     Dose/Rate Route Frequency Ordered Stop   12/03/21 2230  piperacillin-tazobactam (ZOSYN) IVPB 3.375 g        3.375 g 100 mL/hr over 30 Minutes Intravenous  Once 12/03/21 2226 12/03/21 2310       Assessment/Plan: Robotic abdominal perineal resection - 11/03/21 Dr. Johney Maine for anal cancer Small bowel obstruction due to internal hernia/ adhesions - s/p exploratory laparotomy/ lysis of adhesions 12/03/21 - Dr. Rosendo Gros  Discontinue Foley Encourage ambulation Toradol/ home neurontin added to pain regimen Clear liquids only until she regains some bowel function   LOS: 0 days    Beth [name] 12/04/2021

## 2021-12-04 NOTE — Op Note (Signed)
12/04/2021  1:01 AM  PATIENT:  Beth [name]  50 y.o. female  PRE-OPERATIVE DIAGNOSIS:  INTERNAL HERNIA, POSSIBLE ISCHEMIC BOWEL  POST-OPERATIVE DIAGNOSIS: Internal hernia secondary to adhesions  PROCEDURE:  Procedure(s): EXPLORATORY LAPAROTOMY, lysis of adhesions  SURGEON:  Surgeon(s) and Role:    Ralene Ok, MD - Primary   ANESTHESIA:   general  EBL:  25 mL   BLOOD ADMINISTERED:none  DRAINS: none   LOCAL MEDICATIONS USED:  NONE  SPECIMEN:  No Specimen  DISPOSITION OF SPECIMEN:  N/A  COUNTS:  YES  TOURNIQUET:  * No tourniquets in log *  DICTATION: .Dragon Dictation  Patient is a 50 year old female who recently underwent robotic APR with end colostomy.  Patient was seen in outside hospital secondary to continued abdominal pain.  She underwent CT scan.  CT scan was significant for possible bowel ischemia secondary to internal hernia versus volvulus.  Patient was taken to the operating room urgently for an exploratory laparotomy.  Findings: Patient had several thin adhesions to the left lower quadrant area.  This was just inferior to the ostomy site.  There was a internal hernia with herniated loops of small bowel.  These were dilated.  The mesentery was congested however there was no ischemia to the bowel itself.  Adhesions were lysed.  This freed up the bowel.  There was no ischemia to the bowel and the bowel was pink at the end of the case.  Details of procedure: After the patient was consented she was taken back to the OR and placed in supine position with bilateral SCDs in place.  She underwent general endotracheal intubation.  Patient then underwent Foley placement.  Patient was then prepped and draped in standard fashion.  A timeout was called and all facts verified.  Midline incision was made with a #10 blade.  Cautery was used to maintain hemostasis and dissection was taken down the linea alba.  Linea alba was incised the length of the skin incision.   This time the peritoneum was entered bluntly.  There was some ascites however there is no bloody ascites evident.  At this time I sent a fashion to the length of the skin incision.  At this time I was able to place a Richardson retractor into the left lower quadrant area.  I was able to eviscerate portion of the bowel.  There was a portion of bowel that appeared to be dilated.  I traced this down to the left lower quadrant and felt a tethering.  This appeared to be a thin adhesion with about herniated underneath this area.  I was able to visualize this in able to finger fracture the adhesion.  There were several other adhesions that were just inferior to this.  These were also broken up.  It appeared that this was the area of concern.  The bowel appeared pink however was distended.  The mesentery was somewhat congested however there was no ischemia.  At this time secondary to the VRAM flap in the pelvis which I was able to visualize as healthy I decided to minimize my dissection inferiorly.  I was able to palpate it around the ostomy there was no further adhesions taken to be palpated.  At this time I was happy with the look of the bowel.  The omentum was brought over the midline.  The midline fascia was reapproximated using single-stranded #1 PDS in a standard running fashion x2.  The skin was stapled closed.  The ostomy appliance was reapplied.  Honeycomb dressing was placed in midline.  Patient taught the procedure well was taken to the recovery in stable condition.   PLAN OF CARE: Admit to inpatient   PATIENT DISPOSITION:  PACU - hemodynamically stable.   Delay start of Pharmacological VTE agent (>24hrs) due to surgical blood loss or risk of bleeding: Yes

## 2021-12-04 NOTE — Progress Notes (Signed)
Patient arrived to 6N14, AX0X4, able to make needs known. VSS BP157/97 T98.1 PR81 RR15 02sat 98%room air. Has c/o 8/10 abd/surgical pain. Recently was given Dilaudid in PACU before the transfer. Honeycomb dsg C/D/I, colostomy in place. Patient was oriented to call bell and surroundings. Call bell within reach and will continue to close monitor.

## 2021-12-05 ENCOUNTER — Encounter (HOSPITAL_COMMUNITY): Payer: Self-pay | Admitting: General Surgery

## 2021-12-05 ENCOUNTER — Telehealth: Payer: Self-pay

## 2021-12-05 MED ORDER — METOPROLOL TARTRATE 5 MG/5ML IV SOLN: 5.0000 mg | Freq: Four times a day (QID) | INTRAVENOUS | Status: DC | PRN

## 2021-12-05 MED ORDER — PHENOL 1.4 % MT LIQD: 2.0000 | OROMUCOSAL | Status: DC | PRN

## 2021-12-05 MED ORDER — MENTHOL 3 MG MT LOZG: 1.0000 | LOZENGE | OROMUCOSAL | Status: DC | PRN

## 2021-12-05 MED ORDER — SIMETHICONE 40 MG/0.6ML PO SUSP: 80.0000 mg | Freq: Four times a day (QID) | ORAL | Status: DC | PRN

## 2021-12-05 MED ORDER — HYDROMORPHONE HCL 1 MG/ML IJ SOLN
0.5000 mg | INTRAMUSCULAR | Status: DC | PRN
Start: 2021-12-05 — End: 2021-12-08
  Administered 2021-12-05 – 2021-12-07 (×5): 1 mg via INTRAVENOUS
  Filled 2021-12-05 (×5): qty 1

## 2021-12-05 MED ORDER — MAGIC MOUTHWASH
15.0000 mL | Freq: Four times a day (QID) | ORAL | Status: DC | PRN
  Filled 2021-12-05: qty 15

## 2021-12-05 MED ORDER — METHOCARBAMOL 1000 MG/10ML IJ SOLN
1000.0000 mg | Freq: Four times a day (QID) | INTRAVENOUS | Status: DC | PRN
  Filled 2021-12-05: qty 10

## 2021-12-05 MED ORDER — PROCHLORPERAZINE EDISYLATE 10 MG/2ML IJ SOLN
5.0000 mg | INTRAMUSCULAR | Status: DC | PRN
Start: 2021-12-05 — End: 2021-12-08

## 2021-12-05 MED ORDER — LACTATED RINGERS IV BOLUS: 1000.0000 mL | Freq: Three times a day (TID) | INTRAVENOUS | Status: DC | PRN

## 2021-12-05 MED ORDER — SODIUM CHLORIDE 0.9 % IV SOLN: 8.0000 mg | Freq: Four times a day (QID) | INTRAVENOUS | Status: DC | PRN

## 2021-12-05 MED ORDER — ONDANSETRON HCL 4 MG/2ML IJ SOLN: 4.0000 mg | Freq: Four times a day (QID) | INTRAMUSCULAR | Status: DC | PRN

## 2021-12-05 MED ORDER — DIPHENHYDRAMINE HCL 50 MG/ML IJ SOLN: 12.5000 mg | Freq: Four times a day (QID) | INTRAMUSCULAR | Status: DC | PRN

## 2021-12-05 MED ORDER — ALUM & MAG HYDROXIDE-SIMETH 200-200-20 MG/5ML PO SUSP: 30.0000 mL | Freq: Four times a day (QID) | ORAL | Status: DC | PRN

## 2021-12-05 MED ORDER — LIP MEDEX EX OINT
1.0000 "application " | TOPICAL_OINTMENT | Freq: Two times a day (BID) | CUTANEOUS | Status: DC
  Administered 2021-12-05 – 2021-12-08 (×5): 1 via TOPICAL
  Filled 2021-12-05: qty 7

## 2021-12-05 NOTE — Telephone Encounter (Addendum)
Called and spoke to patient. Patient admitted to [facility] with a small bowel obstruction on 12/03/2021. The patient wanted [place] to mail her Barium sulfate. Advice the patient to ask the floor nurse to get her two bottle of Barium sulfate. Patient voiced understanding

## 2021-12-05 NOTE — Anesthesia Postprocedure Evaluation (Signed)
Anesthesia Post Note  Patient: Beth [name]  Procedure(s) Performed: EXPLORATORY LAPAROTOMY, LYSIS OF ADHESIONS (Abdomen)     Patient location during evaluation: PACU Anesthesia Type: General Level of consciousness: awake and alert Pain management: pain level controlled Vital Signs Assessment: post-procedure vital signs reviewed and stable Respiratory status: spontaneous breathing, nonlabored ventilation, respiratory function stable and patient connected to nasal cannula oxygen Cardiovascular status: blood pressure returned to baseline and stable Postop Assessment: no apparent nausea or vomiting Anesthetic complications: no   No notable events documented.  Last Vitals:  Vitals:   12/05/21 0438 12/05/21 0744  BP: 125/82 115/67  Pulse: 72 (!) 54  Resp: 19 16  Temp: 36.8 C 36.6 C  SpO2: 100% 99%    Last Pain:  Vitals:   12/05/21 0744  TempSrc: Oral  PainSc:                  [place] S

## 2021-12-05 NOTE — Progress Notes (Signed)
Beth [name] [id number] 10/08/1971  CARE TEAM:  PCP: Caryl Bis, MD  Outpatient Care Team: Patient Care Team: Caryl Bis, MD as PCP - General (Unknown Physician Specialty) Michael Boston, MD as Consulting Physician (General Surgery) Wonda Horner, MD as Consulting Physician (Gastroenterology) [name], MD as Referring Physician (Obstetrics and Gynecology) Virgel Manifold, MD as Consulting Physician (Gastroenterology) Ladell Pier, MD as Consulting Physician (Oncology)  Inpatient Treatment Team: Treatment Team: Attending Provider: Edison Pace, Md, MD; Registered Nurse: Erasmo Leventhal, RN; Case Manager: Angelita Ingles, RN; Utilization Review: Hetty Ely, RN; Consulting Physician: Michael Boston, MD   Problem List:   Principal Problem:   S/P exploratory laparotomy   2 Days Post-Op  12/04/2021  POST-OPERATIVE DIAGNOSIS: Internal hernia secondary to adhesions   PROCEDURE: EXPLORATORY LAPAROTOMY, lysis of adhesions   SURGEON:  Ralene Ok, MD - Primary  Findings: Patient had several thin adhesions to the left lower quadrant area.  This was just inferior to the ostomy site.  There was a internal hernia with herniated loops of small bowel.  These were dilated.  The mesentery was congested however there was no ischemia to the bowel itself.  Adhesions were lysed.  This freed up the bowel.  There was no ischemia to the bowel and the bowel was pink at the end of the case.  Assessment  Ileus after emergent lyse adhesions for bowel obstruction  [facility] Stay = 1 days)  Plan:  Keep on clear liquids for now.  Try and get up and mobilize.  Pain control. -VTE prophylaxis- SCDs, etc -mobilize as tolerated to help recovery  Disposition:  Disposition:  The patient is from: Home  Anticipate discharge to:  Home  Anticipated Date of Discharge is:  December 22,2022    Barriers to discharge:  Pending Clinical improvement (more likely than  not)  Patient currently is NOT MEDICALLY STABLE for discharge from the hospital from a surgery standpoint.      25 minutes spent in review, evaluation, examination, counseling, and coordination of care.   I have reviewed this patient's available data, including medical history, events of note, physical examination and test results as part of my evaluation.  A significant portion of that time was spent in counseling.  Care during the described time interval was provided by me.  12/05/2021    Subjective: (Chief complaint)  Sore with some crampiness but better overall.  Tolerating sips of liquids  Objective:  Vital signs:  Vitals:   12/04/21 2050 12/04/21 2355 12/05/21 0438 12/05/21 0744  BP: 109/66 110/65 125/82 115/67  Pulse: 76 74 72 (!) 54  Resp: 18 17 19 16   Temp: 98.2 F (36.8 C) 98.4 F (36.9 C) 98.2 F (36.8 C) 97.9 F (36.6 C)  TempSrc:    Oral  SpO2: 99% 99% 100% 99%  Weight:      Height:           Intake/Output   Yesterday:  12/18 0701 - 12/19 0700 In: 2481.4 [I.V.:2481.4] Out: -  This shift:  No intake/output data recorded.  Bowel function:  Flatus: No  BM:  No  Drain: (No drain)   Physical Exam:  General: Pt awake/alert in no acute distress Eyes: PERRL, normal EOM.  Sclera clear.  No icterus Neuro: CN II-XII intact w/o focal sensory/motor deficits. Lymph: No head/neck/groin lymphadenopathy Psych:  No delerium/psychosis/paranoia.  Oriented x 4 HENT: Normocephalic, Mucus membranes moist.  No thrush Neck: Supple, No tracheal deviation.  No obvious thyromegaly  Chest: No pain to chest wall compression.  Good respiratory excursion.  No audible wheezing CV:  Pulses intact.  Regular rhythm.  No major extremity edema MS: Normal AROM mjr joints.  No obvious deformity  Abdomen: Somewhat firm.  Moderately distended.  Tenderness at L side mild - no peritonitis .  No evidence of peritonitis.  No incarcerated hernias.  Ext:   No deformity.  No mjr  edema.  No cyanosis Skin: No petechiae / purpurea.  No major sores.  Warm and dry    Results:   Cultures: Recent Results (from the past 720 hour(s))  Resp Panel by RT-PCR (Flu A&B, Covid) Nasopharyngeal Swab     Status: None   Collection Time: 12/03/21 10:02 PM   Specimen: Nasopharyngeal Swab; Nasopharyngeal(NP) swabs in vial transport medium  Result Value Ref Range Status   SARS Coronavirus 2 by RT PCR NEGATIVE NEGATIVE Final    Comment: (NOTE) SARS-CoV-2 target nucleic acids are NOT DETECTED.  The SARS-CoV-2 RNA is generally detectable in upper respiratory specimens during the acute phase of infection. The lowest concentration of SARS-CoV-2 viral copies this assay can detect is 138 copies/mL. A negative result does not preclude SARS-Cov-2 infection and should not be used as the sole basis for treatment or other patient management decisions. A negative result may occur with  improper specimen collection/handling, submission of specimen other than nasopharyngeal swab, presence of viral mutation(s) within the areas targeted by this assay, and inadequate number of viral copies(<138 copies/mL). A negative result must be combined with clinical observations, patient history, and epidemiological information. The expected result is Negative.  Fact Sheet for Patients:  EntrepreneurPulse.com.au  Fact Sheet for Healthcare Providers:  IncredibleEmployment.be  This test is no t yet approved or cleared by the [place] FDA and  has been authorized for detection and/or diagnosis of SARS-CoV-2 by FDA under an Emergency Use Authorization (EUA). This EUA will remain  in effect (meaning this test can be used) for the duration of the COVID-19 declaration under Section 564(b)(1) of the Act, 21 U.S.C.section 360bbb-3(b)(1), unless the authorization is terminated  or revoked sooner.       Influenza A by PCR NEGATIVE NEGATIVE Final   Influenza B by PCR  NEGATIVE NEGATIVE Final    Comment: (NOTE) The Xpert Xpress SARS-CoV-2/FLU/RSV plus assay is intended as an aid in the diagnosis of influenza from Nasopharyngeal swab specimens and should not be used as a sole basis for treatment. Nasal washings and aspirates are unacceptable for Xpert Xpress SARS-CoV-2/FLU/RSV testing.  Fact Sheet for Patients: EntrepreneurPulse.com.au  Fact Sheet for Healthcare Providers: IncredibleEmployment.be  This test is not yet approved or cleared by the [place] FDA and has been authorized for detection and/or diagnosis of SARS-CoV-2 by FDA under an Emergency Use Authorization (EUA). This EUA will remain in effect (meaning this test can be used) for the duration of the COVID-19 declaration under Section 564(b)(1) of the Act, 21 U.S.C. section 360bbb-3(b)(1), unless the authorization is terminated or revoked.  Performed at [facility], [address], [place], [place] [address]     Labs: Results for orders placed or performed during the hospital encounter of 12/03/21 (from the past 48 hour(s))  Comprehensive metabolic panel     Status: Abnormal   Collection Time: 12/03/21  7:25 PM  Result Value Ref Range   Sodium 137 135 - 145 mmol/L   Potassium 3.5 3.5 - 5.1 mmol/L   Chloride 100 98 - 111 mmol/L   CO2 22 22 -  32 mmol/L   Glucose, Bld 168 (H) 70 - 99 mg/dL    Comment: Glucose reference range applies only to samples taken after fasting for at least 8 hours.   BUN 11 6 - 20 mg/dL   Creatinine, Ser 0.56 0.44 - 1.00 mg/dL   Calcium 9.9 8.9 - 10.3 mg/dL   Total Protein 8.3 (H) 6.5 - 8.1 g/dL   Albumin 4.3 3.5 - 5.0 g/dL   AST 62 (H) 15 - 41 U/L   ALT 62 (H) 0 - 44 U/L   Alkaline Phosphatase 91 38 - 126 U/L   Total Bilirubin 0.8 0.3 - 1.2 mg/dL   GFR, Estimated >60 >60 mL/min    Comment: (NOTE) Calculated using the CKD-EPI Creatinine Equation (2021)    Anion gap 15 5 - 15    Comment: Performed at [facility], [address] [place], [place] [address]  Lipase, blood     Status: None   Collection Time: 12/03/21  7:25 PM  Result Value Ref Range   Lipase 23 11 - 51 U/L    Comment: Performed at [facility], [address] [place], [place] [address]  CBC with Differential     Status: Abnormal   Collection Time: 12/03/21  7:25 PM  Result Value Ref Range   WBC 12.2 (H) 4.0 - 10.5 K/uL   RBC 4.17 3.87 - 5.11 MIL/uL   Hemoglobin 13.6 12.0 - 15.0 g/dL   HCT 41.1 36.0 - 46.0 %   MCV 98.6 80.0 - 100.0 fL   MCH 32.6 26.0 - 34.0 pg   MCHC 33.1 30.0 - 36.0 g/dL   RDW 13.3 11.5 - 15.5 %   Platelets 359 150 - 400 K/uL   nRBC 0.0 0.0 - 0.2 %   Neutrophils Relative % 88 %   Neutro Abs 10.9 (H) 1.7 - 7.7 K/uL   Lymphocytes Relative 6 %   Lymphs Abs 0.7 0.7 - 4.0 K/uL   Monocytes Relative 4 %   Monocytes Absolute 0.4 0.1 - 1.0 K/uL   Eosinophils Relative 1 %   Eosinophils Absolute 0.1 0.0 - 0.5 K/uL   Basophils Relative 0 %   Basophils Absolute 0.0 0.0 - 0.1 K/uL   Immature Granulocytes 1 %   Abs Immature Granulocytes 0.07 0.00 - 0.07 K/uL    Comment: Performed at [facility], [address] [place], [place] [address]  Urinalysis, Routine w reflex microscopic     Status: Abnormal   Collection Time: 12/03/21  8:09 PM  Result Value Ref Range   Color, Urine YELLOW YELLOW   APPearance HAZY (A) CLEAR   Specific Gravity, Urine 1.028 1.005 - 1.030   pH 8.0 5.0 - 8.0   Glucose, UA NEGATIVE NEGATIVE mg/dL   Hgb urine dipstick NEGATIVE NEGATIVE   Bilirubin Urine NEGATIVE NEGATIVE   Ketones, ur 20 (A) NEGATIVE mg/dL   Protein, ur >=300 (A) NEGATIVE mg/dL   Nitrite NEGATIVE NEGATIVE   Leukocytes,Ua TRACE (A) NEGATIVE   RBC / HPF 0-5 0 - 5 RBC/hpf   WBC, UA 11-20 0 - 5 WBC/hpf   Bacteria, UA NONE SEEN NONE SEEN   Squamous Epithelial / LPF 0-5 0 - 5   Mucus PRESENT    Amorphous Crystal PRESENT     Comment: Performed at [facility], [address] [place], [place] [address]  Resp Panel by RT-PCR  (Flu A&B, Covid) Nasopharyngeal Swab     Status: None   Collection Time: 12/03/21 10:02 PM  Specimen: Nasopharyngeal Swab; Nasopharyngeal(NP) swabs in vial transport medium  Result Value Ref Range   SARS Coronavirus 2 by RT PCR NEGATIVE NEGATIVE    Comment: (NOTE) SARS-CoV-2 target nucleic acids are NOT DETECTED.  The SARS-CoV-2 RNA is generally detectable in upper respiratory specimens during the acute phase of infection. The lowest concentration of SARS-CoV-2 viral copies this assay can detect is 138 copies/mL. A negative result does not preclude SARS-Cov-2 infection and should not be used as the sole basis for treatment or other patient management decisions. A negative result may occur with  improper specimen collection/handling, submission of specimen other than nasopharyngeal swab, presence of viral mutation(s) within the areas targeted by this assay, and inadequate number of viral copies(<138 copies/mL). A negative result must be combined with clinical observations, patient history, and epidemiological information. The expected result is Negative.  Fact Sheet for Patients:  EntrepreneurPulse.com.au  Fact Sheet for Healthcare Providers:  IncredibleEmployment.be  This test is no t yet approved or cleared by the [place] FDA and  has been authorized for detection and/or diagnosis of SARS-CoV-2 by FDA under an Emergency Use Authorization (EUA). This EUA will remain  in effect (meaning this test can be used) for the duration of the COVID-19 declaration under Section 564(b)(1) of the Act, 21 U.S.C.section 360bbb-3(b)(1), unless the authorization is terminated  or revoked sooner.       Influenza A by PCR NEGATIVE NEGATIVE   Influenza B by PCR NEGATIVE NEGATIVE    Comment: (NOTE) The Xpert Xpress SARS-CoV-2/FLU/RSV plus assay is intended as an aid in the diagnosis of influenza from Nasopharyngeal swab specimens and should not be used as  a sole basis for treatment. Nasal washings and aspirates are unacceptable for Xpert Xpress SARS-CoV-2/FLU/RSV testing.  Fact Sheet for Patients: EntrepreneurPulse.com.au  Fact Sheet for Healthcare Providers: IncredibleEmployment.be  This test is not yet approved or cleared by the [place] FDA and has been authorized for detection and/or diagnosis of SARS-CoV-2 by FDA under an Emergency Use Authorization (EUA). This EUA will remain in effect (meaning this test can be used) for the duration of the COVID-19 declaration under Section 564(b)(1) of the Act, 21 U.S.C. section 360bbb-3(b)(1), unless the authorization is terminated or revoked.  Performed at [facility], [address], [place],  [address]   Basic metabolic panel     Status: Abnormal   Collection Time: 12/04/21  3:26 AM  Result Value Ref Range   Sodium 138 135 - 145 mmol/L   Potassium 3.9 3.5 - 5.1 mmol/L   Chloride 107 98 - 111 mmol/L   CO2 23 22 - 32 mmol/L   Glucose, Bld 170 (H) 70 - 99 mg/dL    Comment: Glucose reference range applies only to samples taken after fasting for at least 8 hours.   BUN 9 6 - 20 mg/dL   Creatinine, Ser 0.73 0.44 - 1.00 mg/dL   Calcium 9.0 8.9 - 10.3 mg/dL   GFR, Estimated >60 >60 mL/min    Comment: (NOTE) Calculated using the CKD-EPI Creatinine Equation (2021)    Anion gap 8 5 - 15    Comment: Performed at [facility] [address], [place] [address]  CBC     Status: Abnormal   Collection Time: 12/04/21  3:26 AM  Result Value Ref Range   WBC 16.0 (H) 4.0 - 10.5 K/uL   RBC 3.57 (L) 3.87 - 5.11 MIL/uL   Hemoglobin 11.6 (L) 12.0 - 15.0 g/dL   HCT 35.4 (  L) 36.0 - 46.0 %   MCV 99.2 80.0 - 100.0 fL   MCH 32.5 26.0 - 34.0 pg   MCHC 32.8 30.0 - 36.0 g/dL   RDW 13.3 11.5 - 15.5 %   Platelets 272 150 - 400 K/uL   nRBC 0.0 0.0 - 0.2 %    Comment: Performed at [facility] [address], [place], [place] [address]     Imaging / Studies: CT ABDOMEN PELVIS W CONTRAST  Result Date: 12/03/2021 CLINICAL DATA:  History of anal cancer resection surgery and colostomy last month, with worsening lower abdominal pain. EXAM: CT ABDOMEN AND PELVIS WITH CONTRAST TECHNIQUE: Multidetector CT imaging of the abdomen and pelvis was performed using the standard protocol following bolus administration of intravenous contrast. CONTRAST:  160mL OMNIPAQUE IOHEXOL 300 MG/ML  SOLN COMPARISON:  CT chest, abdomen and pelvis with contrast 03/07/2021. FINDINGS: Lower chest: No lung base nodule is seen. The cardiac size is normal. Calcification is again noted in the LAD coronary artery. Hepatobiliary: No focal liver abnormality is seen. No gallstones or gallbladder wall thickening. The common bile duct is more prominent than previously, currently 8 mm in diameter previously 5 mm. Laboratory and clinical correlation advised. No ductal filling defect is visible. Pancreas: Unremarkable. Spleen: Unremarkable. Adrenals/Urinary Tract: There is no adrenal mass. No renal mass, calculus or hydronephrosis. Somewhat thickened bladder is seen but also nondistended. Stomach/Bowel: There are new surgical changes. There has been resection of the rectosigmoid colon with output of the descending colon through a left mid abdominal colostomy 7-8 cm to left of the umbilicus. There is evidence of a prior incision right of midline which might have been the laparotomy incision and extends for several cm, postsurgical changes in the abdominal wall underlying this. The stomach and duodenum are relatively decompressed, but beginning at the jejunum there is dilatation of small bowel up to 4 cm in caliber with multiple dilated segments in the upper 2 mid abdomen. The transition is in the left lower quadrant where there is a clockwise twisting of the small bowel and mesenteric vessels on axial images 55-63 of series 2 consistent with a postoperative small bowel volvulus. Some of  the dilated small bowel above this is thickened and there is mesenteric edema both above and below this level in the left abdomen, and scattered fluid in the left abdominal mesenteric folds tracking cephalad along side the spleen. No bowel pneumatosis is evident but the wall thickening in the dilated segments could indicate early ischemia. There is mild stool retention in the ascending and transverse colon. The transverse colon is quite redundant in this patient with the midportion dipping down into the pelvis including into the presacral space. There are scattered uncomplicated diverticula. Vascular/Lymphatic: Aortic atherosclerosis. No enlarged abdominal or pelvic lymph nodes. Reproductive: Uterus is absent. There previously was a 2.1 cm left ovarian cyst which is no longer seen. There is no adnexal mass. Other: As above there is scattered ascites in the left abdomen tracking into the pelvis. There is a presacral thick walled rim enhancing collection measuring 4.4 x 2.8 by 3.9 cm (series 2 axial 79, coronal reconstruction image 76) and 10.9 Hounsfield units and does not appear connected to the bowel. This could be a seroma, hematoma, or abscess but there is no air in the collection. No pneumoperitoneum is seen. Musculoskeletal: Slight lumbar dextroscoliosis. No destructive osseous or lytic lesions. IMPRESSION: 1. High-grade small-bowel obstruction transitioning in the left lower abdomen where there is a clockwise twisting of  the bowel and mesentery consistent with a postoperative volvulus or internal hernia. 2. The level of obstruction is probably in the mid ileum with decompressed small bowel in the low central abdomen and right lower quadrant. 3. Some of the small bowel above this level is thickened, with mesenteric congestive features adjacent. No pneumatosis is seen but early ischemia can't be excluded. 4. Presacral rim enhancing 4.4 x 2.8 x 3.8 cm fluid collection which could be a seroma, hematoma or abscess.  There is no air in the collection. 5. Postsurgical changes with rectosigmoid resection and left mid abdominal descending colostomy. 6. Cystitis versus bladder nondistention. 7. Small amount of abdominal and pelvic ascites. 8. Prior study described a potential abnormality in the right lobe of the liver which is not seen today, and a cystic lesion of the right ovary which has resolved. 9. Mild prominence of the common bile duct increased from the prior study. Laboratory and clinical correlation advised. No focal abnormality of the gallbladder. No significant intrahepatic biliary prominence. Electronically Signed   By: Telford Nab M.D.   On: 12/03/2021 21:56    Medications / Allergies: per chart  Antibiotics: Anti-infectives (From admission, onward)    Start     Dose/Rate Route Frequency Ordered Stop   12/03/21 2230  piperacillin-tazobactam (ZOSYN) IVPB 3.375 g        3.375 g 100 mL/hr over 30 Minutes Intravenous  Once 12/03/21 2226 12/03/21 2310         Note: Portions of this report may have been transcribed using voice recognition software. Every effort was made to ensure accuracy; however, inadvertent computerized transcription errors may be present.   Any transcriptional errors that result from this process are unintentional.    [name], MD, FACS, MASCRS Esophageal, Gastrointestinal & Colorectal Surgery Robotic and Minimally Invasive Surgery  [facility], [place]  [place]. [address], [place], [place] [address] [phone] Fax [phone] Main  CONTACT INFORMATION:  Weekday (9AM-5PM): Call [facility] main office at [phone]  Weeknight (5PM-9AM) or Weekend/Holiday: Check [email or web address] (password " [email or web address]") for General Surgery [facility] coverage  (Please, do not use SecureChat as it is not reliable communication to operating surgeons for immediate patient care)      12/05/2021  8:23 AM

## 2021-12-06 ENCOUNTER — Encounter: Payer: Self-pay | Admitting: Oncology

## 2021-12-06 DIAGNOSIS — K56609 Unspecified intestinal obstruction, unspecified as to partial versus complete obstruction: Secondary | ICD-10-CM

## 2021-12-06 LAB — CBC
HCT: 31.1 % — ABNORMAL LOW (ref 36.0–46.0)
Hemoglobin: 9.6 g/dL — ABNORMAL LOW (ref 12.0–15.0)
MCH: 31.4 pg (ref 26.0–34.0)
MCHC: 30.9 g/dL (ref 30.0–36.0)
MCV: 101.6 fL — ABNORMAL HIGH (ref 80.0–100.0)
Platelets: 173 10*3/uL (ref 150–400)
RBC: 3.06 MIL/uL — ABNORMAL LOW (ref 3.87–5.11)
RDW: 13.8 % (ref 11.5–15.5)
WBC: 5.7 10*3/uL (ref 4.0–10.5)
nRBC: 0 % (ref 0.0–0.2)

## 2021-12-06 LAB — BASIC METABOLIC PANEL
Anion gap: 6 (ref 5–15)
BUN: <5 mg/dL — ABNORMAL LOW (ref 6–20)
CO2: 26 mmol/L (ref 22–32)
Calcium: 8.8 mg/dL — ABNORMAL LOW (ref 8.9–10.3)
Chloride: 109 mmol/L (ref 98–111)
Creatinine, Ser: 0.65 mg/dL (ref 0.44–1.00)
GFR, Estimated: >60 mL/min (ref >60)
Glucose, Bld: 103 mg/dL — ABNORMAL HIGH (ref 70–99)
Potassium: 3.6 mmol/L (ref 3.5–5.1)
Sodium: 141 mmol/L (ref 135–145)

## 2021-12-06 LAB — MAGNESIUM: Magnesium: 1.8 mg/dL (ref 1.7–2.4)

## 2021-12-06 MED ORDER — BARIUM SULFATE 2.1 % PO SUSP
ORAL | Status: AC
  Filled 2021-12-06: qty 2

## 2021-12-06 NOTE — Telephone Encounter (Signed)
View Chart

## 2021-12-06 NOTE — Progress Notes (Signed)
Monette Omara Jezek [id number] 10/18/1971  CARE TEAM:  PCP: Caryl Bis, MD  Outpatient Care Team: Patient Care Team: Caryl Bis, MD as PCP - General (Unknown Physician Specialty) Michael Boston, MD as Consulting Physician (General Surgery) Wonda Horner, MD as Consulting Physician (Gastroenterology) [name], MD as Referring Physician (Obstetrics and Gynecology) Virgel Manifold, MD as Consulting Physician (Gastroenterology) Ladell Pier, MD as Consulting Physician (Oncology)  Inpatient Treatment Team: Treatment Team: Attending Provider: Edison Pace, Md, MD; Consulting Physician: Michael Boston, MD; Mobility Specialist: Derenda Mis; Registered Nurse: Nicholes Calamity, RN; Registered Nurse: Erasmo Leventhal, RN; Utilization Review: Hetty Ely, RN; Case Manager: Angelita Ingles, RN   Problem List:   Principal Problem:   S/P exploratory laparotomy   3 Days Post-Op  12/04/2021  POST-OPERATIVE DIAGNOSIS: Internal hernia secondary to adhesions   PROCEDURE: EXPLORATORY LAPAROTOMY, lysis of adhesions   SURGEON:  Ralene Ok, MD - Primary  Findings: Patient had several thin adhesions to the left lower quadrant area.  This was just inferior to the ostomy site.  There was a internal hernia with herniated loops of small bowel.  These were dilated.  The mesentery was congested however there was no ischemia to the bowel itself.  Adhesions were lysed.  This freed up the bowel.  There was no ischemia to the bowel and the bowel was pink at the end of the case.  Assessment  Ileus after emergent lyse adhesions for bowel obstruction  [facility] Stay = 2 days)  Plan:  Adv to full liquids.   Pain control. -VTE prophylaxis- SCDs, etc -mobilize as tolerated to help recovery  -Pt wishes to get her PO CT scan contrast OK to give PO CT scan contrast to patient for her planned outpatient cancer surveillance CT scan per Dr Benay Spice with medical  oncology.  Disposition:  Disposition:  The patient is from: Home  Anticipate discharge to:  Home  Anticipated Date of Discharge is:  December 22,2022    Barriers to discharge:  Pending Clinical improvement (more likely than not)  Patient currently is NOT MEDICALLY STABLE for discharge from the hospital from a surgery standpoint.      25 minutes spent in review, evaluation, examination, counseling, and coordination of care.   I have reviewed this patient's available data, including medical history, events of note, physical examination and test results as part of my evaluation.  A significant portion of that time was spent in counseling.  Care during the described time interval was provided by me.  12/06/2021    Subjective: (Chief complaint)  Sore with some crampiness but better overall.  Tolerating liquids  Walking a lot in hallways  Objective:  Vital signs:  Vitals:   12/05/21 1625 12/05/21 2020 12/06/21 0421 12/06/21 0759  BP: 117/74 127/62 109/64 124/83  Pulse: 61 60 (!) 58 67  Resp: 18 17 15 17   Temp: 98 F (36.7 C) 98.1 F (36.7 C) 98.1 F (36.7 C) 98.2 F (36.8 C)  TempSrc: Oral Oral Oral Oral  SpO2: 98% 100% 96% 100%  Weight:      Height:        Last BM Date: 12/05/21  Intake/Output   Yesterday:  12/19 0701 - 12/20 0700 In: 2546.5 [P.O.:2000; I.V.:543.4; IV Piggyback:3.1] Out: 1200 [Urine:1200] This shift:  No intake/output data recorded.  Bowel function:  Flatus: YES  BM:  No  Drain: (No drain)   Physical Exam:  General: Pt awake/alert in no acute distress Eyes: PERRL,  normal EOM.  Sclera clear.  No icterus Neuro: CN II-XII intact w/o focal sensory/motor deficits. Lymph: No head/neck/groin lymphadenopathy Psych:  No delerium/psychosis/paranoia.  Oriented x 4 HENT: Normocephalic, Mucus membranes moist.  No thrush Neck: Supple, No tracheal deviation.  No obvious thyromegaly Chest: No pain to chest wall compression.  Good respiratory  excursion.  No audible wheezing CV:  Pulses intact.  Regular rhythm.  No major extremity edema MS: Normal AROM mjr joints.  No obvious deformity  Abdomen: Soft.  Mildy distended.  Tenderness at L side mild - no peritonitis .  No evidence of peritonitis.  No incarcerated hernias.  Ext:   No deformity.  No mjr edema.  No cyanosis Skin: No petechiae / purpurea.  No major sores.  Warm and dry    Results:   Cultures: Recent Results (from the past 720 hour(s))  Resp Panel by RT-PCR (Flu A&B, Covid) Nasopharyngeal Swab     Status: None   Collection Time: 12/03/21 10:02 PM   Specimen: Nasopharyngeal Swab; Nasopharyngeal(NP) swabs in vial transport medium  Result Value Ref Range Status   SARS Coronavirus 2 by RT PCR NEGATIVE NEGATIVE Final    Comment: (NOTE) SARS-CoV-2 target nucleic acids are NOT DETECTED.  The SARS-CoV-2 RNA is generally detectable in upper respiratory specimens during the acute phase of infection. The lowest concentration of SARS-CoV-2 viral copies this assay can detect is 138 copies/mL. A negative result does not preclude SARS-Cov-2 infection and should not be used as the sole basis for treatment or other patient management decisions. A negative result may occur with  improper specimen collection/handling, submission of specimen other than nasopharyngeal swab, presence of viral mutation(s) within the areas targeted by this assay, and inadequate number of viral copies(<138 copies/mL). A negative result must be combined with clinical observations, patient history, and epidemiological information. The expected result is Negative.  Fact Sheet for Patients:  EntrepreneurPulse.com.au  Fact Sheet for Healthcare Providers:  IncredibleEmployment.be  This test is no t yet approved or cleared by the [place] FDA and  has been authorized for detection and/or diagnosis of SARS-CoV-2 by FDA under an Emergency Use Authorization (EUA).  This EUA will remain  in effect (meaning this test can be used) for the duration of the COVID-19 declaration under Section 564(b)(1) of the Act, 21 U.S.C.section 360bbb-3(b)(1), unless the authorization is terminated  or revoked sooner.       Influenza A by PCR NEGATIVE NEGATIVE Final   Influenza B by PCR NEGATIVE NEGATIVE Final    Comment: (NOTE) The Xpert Xpress SARS-CoV-2/FLU/RSV plus assay is intended as an aid in the diagnosis of influenza from Nasopharyngeal swab specimens and should not be used as a sole basis for treatment. Nasal washings and aspirates are unacceptable for Xpert Xpress SARS-CoV-2/FLU/RSV testing.  Fact Sheet for Patients: EntrepreneurPulse.com.au  Fact Sheet for Healthcare Providers: IncredibleEmployment.be  This test is not yet approved or cleared by the [place] FDA and has been authorized for detection and/or diagnosis of SARS-CoV-2 by FDA under an Emergency Use Authorization (EUA). This EUA will remain in effect (meaning this test can be used) for the duration of the COVID-19 declaration under Section 564(b)(1) of the Act, 21 U.S.C. section 360bbb-3(b)(1), unless the authorization is terminated or revoked.  Performed at [facility], [address], [place], [place] [address]     Labs: Results for orders placed or performed during the hospital encounter of 12/03/21 (from the past 48 hour(s))  Basic metabolic panel     Status:  Abnormal   Collection Time: 12/06/21  3:32 AM  Result Value Ref Range   Sodium 141 135 - 145 mmol/L   Potassium 3.6 3.5 - 5.1 mmol/L   Chloride 109 98 - 111 mmol/L   CO2 26 22 - 32 mmol/L   Glucose, Bld 103 (H) 70 - 99 mg/dL    Comment: Glucose reference range applies only to samples taken after fasting for at least 8 hours.   BUN <5 (L) 6 - 20 mg/dL   Creatinine, Ser 0.65 0.44 - 1.00 mg/dL   Calcium 8.8 (L) 8.9 - 10.3 mg/dL   GFR, Estimated >60 >60 mL/min    Comment:  (NOTE) Calculated using the CKD-EPI Creatinine Equation (2021)    Anion gap 6 5 - 15    Comment: Performed at [facility] [address], [place], [place] [address]  CBC     Status: Abnormal   Collection Time: 12/06/21  3:32 AM  Result Value Ref Range   WBC 5.7 4.0 - 10.5 K/uL   RBC 3.06 (L) 3.87 - 5.11 MIL/uL   Hemoglobin 9.6 (L) 12.0 - 15.0 g/dL   HCT 31.1 (L) 36.0 - 46.0 %   MCV 101.6 (H) 80.0 - 100.0 fL   MCH 31.4 26.0 - 34.0 pg   MCHC 30.9 30.0 - 36.0 g/dL   RDW 13.8 11.5 - 15.5 %   Platelets 173 150 - 400 K/uL   nRBC 0.0 0.0 - 0.2 %    Comment: Performed at [facility], [place] [address] [place], [place] [address]  Magnesium     Status: None   Collection Time: 12/06/21  3:32 AM  Result Value Ref Range   Magnesium 1.8 1.7 - 2.4 mg/dL    Comment: Performed at [facility] [address], [place], [place] [address]    Imaging / Studies: No results found.  Medications / Allergies: per chart  Antibiotics: Anti-infectives (From admission, onward)    Start     Dose/Rate Route Frequency Ordered Stop   12/03/21 2230  piperacillin-tazobactam (ZOSYN) IVPB 3.375 g        3.375 g 100 mL/hr over 30 Minutes Intravenous  Once 12/03/21 2226 12/03/21 2310         Note: Portions of this report may have been transcribed using voice recognition software. Every effort was made to ensure accuracy; however, inadvertent computerized transcription errors may be present.   Any transcriptional errors that result from this process are unintentional.    [name], MD, FACS, MASCRS Esophageal, Gastrointestinal & Colorectal Surgery Robotic and Minimally Invasive Surgery  [facility], [place]  [place]. [address], [place], [place] [address] [phone] Fax [phone] Main  CONTACT INFORMATION:  Weekday (9AM-5PM): Call [facility] main office at [phone]  Weeknight (5PM-9AM) or Weekend/Holiday:  Check [email or web address] (password " [email or web address]") for General Surgery [facility] coverage  (Please, do not use SecureChat as it is not reliable communication to operating surgeons for immediate patient care)      12/06/2021  8:27 AM

## 2021-12-06 NOTE — Plan of Care (Signed)
Pt has received 2 doses of prn pain meds and received scheduled tylenol and tramadol. Pt progressing. Vitals stable. Ambulating in hall at shift changed at [place].  Problem: Education: Goal: Knowledge of General Education information will improve Description: Including pain rating scale, medication(s)/side effects and non-pharmacologic comfort measures Outcome: Progressing   Problem: Health Behavior/Discharge Planning: Goal: Ability to manage health-related needs will improve Outcome: Progressing   Problem: Clinical Measurements: Goal: Ability to maintain clinical measurements within normal limits will improve Outcome: Progressing Goal: Will remain free from infection Outcome: Progressing Goal: Diagnostic test results will improve Outcome: Progressing Goal: Respiratory complications will improve Outcome: Progressing Goal: Cardiovascular complication will be avoided Outcome: Progressing   Problem: Activity: Goal: Risk for activity intolerance will decrease Outcome: Progressing   Problem: Nutrition: Goal: Adequate nutrition will be maintained Outcome: Progressing   Problem: Coping: Goal: Level of anxiety will decrease Outcome: Progressing   Problem: Elimination: Goal: Will not experience complications related to bowel motility Outcome: Progressing Goal: Will not experience complications related to urinary retention Outcome: Progressing   Problem: Pain Managment: Goal: General experience of comfort will improve Outcome: Progressing   Problem: Safety: Goal: Ability to remain free from injury will improve Outcome: Progressing   Problem: Skin Integrity: Goal: Risk for impaired skin integrity will decrease Outcome: Progressing

## 2021-12-06 NOTE — Progress Notes (Signed)
Patient received oral contrast to take home with her for outpatient CT scan.

## 2021-12-06 NOTE — Social Work (Signed)
°  Transition of Care (TOC) Screening Note   Patient Details  Name: TIFFNAY BOSSI Date of Birth: 04-04-1971   Transition of Care [facility]) CM/SW Contact:    Emeterio Reeve, LCSW Phone Number: 12/06/2021, 11:04 AM    Transition of Care Department [facility]) has reviewed patient and no TOC needs have been identified at this time. We will continue to monitor patient advancement through interdisciplinary progression rounds. If new patient transition needs arise, please place a TOC consult.

## 2021-12-07 ENCOUNTER — Other Ambulatory Visit (HOSPITAL_COMMUNITY): Payer: Self-pay

## 2021-12-07 ENCOUNTER — Inpatient Hospital Stay (HOSPITAL_COMMUNITY): Payer: BC Managed Care – PPO

## 2021-12-07 ENCOUNTER — Encounter: Payer: Self-pay | Admitting: Oncology

## 2021-12-07 MED ORDER — OXYCODONE HCL 5 MG PO TABS
5.0000 mg | ORAL_TABLET | Freq: Four times a day (QID) | ORAL | 0 refills | Status: DC | PRN
  Filled 2021-12-07: qty 30, 4d supply, fill #0

## 2021-12-07 MED ORDER — SODIUM CHLORIDE 0.9 % IV SOLN: 250.0000 mL | INTRAVENOUS | Status: DC | PRN

## 2021-12-07 MED ORDER — CALCIUM POLYCARBOPHIL 625 MG PO TABS
625.0000 mg | ORAL_TABLET | Freq: Two times a day (BID) | ORAL | Status: DC
  Administered 2021-12-07 – 2021-12-08 (×3): 625 mg via ORAL
  Filled 2021-12-07 (×3): qty 1

## 2021-12-07 MED ORDER — IOHEXOL 300 MG/ML  SOLN
75.0000 mL | Freq: Once | INTRAMUSCULAR | Status: AC | PRN
  Administered 2021-12-07: 14:00:00 75 mL via INTRAVENOUS

## 2021-12-07 MED ORDER — SODIUM CHLORIDE 0.9% FLUSH
3.0000 mL | Freq: Two times a day (BID) | INTRAVENOUS | Status: DC
  Administered 2021-12-07 – 2021-12-08 (×2): 3 mL via INTRAVENOUS

## 2021-12-07 MED ORDER — LACTATED RINGERS IV BOLUS: 1000.0000 mL | Freq: Three times a day (TID) | INTRAVENOUS | Status: DC | PRN

## 2021-12-07 MED ORDER — SODIUM CHLORIDE 0.9% FLUSH: 3.0000 mL | INTRAVENOUS | Status: DC | PRN

## 2021-12-07 NOTE — Progress Notes (Signed)
Beth [name] [id number] January 11, 1971  CARE TEAM:  PCP: Caryl Bis, MD  Outpatient Care Team: Patient Care Team: Caryl Bis, MD as PCP - General (Unknown Physician Specialty) Michael Boston, MD as Consulting Physician (General Surgery) Wonda Horner, MD as Consulting Physician (Gastroenterology) [name], MD as Referring Physician (Obstetrics and Gynecology) Virgel Manifold, MD as Consulting Physician (Gastroenterology) Ladell Pier, MD as Consulting Physician (Oncology)  Inpatient Treatment Team: Treatment Team: Attending Provider: Edison Pace, Md, MD; Consulting Physician: Michael Boston, MD; Mobility Specialist: Derenda Mis Consulting Physician: Ladell Pier, MD; Technician: Adele Barthel, NT; Registered Nurse: Lucia Bitter, RN; Technician: Alma Friendly, NT   Problem List:   Principal Problem:   SBO (small bowel obstruction) s/p lysis of adhesions 12/04/2021 Active Problems:   Squamous cell carcinoma of anal canal ([place])   S/P exploratory laparotomy   4 Days Post-Op  12/04/2021  POST-OPERATIVE DIAGNOSIS: Internal hernia secondary to adhesions   PROCEDURE: EXPLORATORY LAPAROTOMY, lysis of adhesions   SURGEON:  Ralene Ok, MD - Primary  Findings: Patient had several thin adhesions to the left lower quadrant area.  This was just inferior to the ostomy site.  There was a internal hernia with herniated loops of small bowel.  These were dilated.  The mesentery was congested however there was no ischemia to the bowel itself.  Adhesions were lysed.  This freed up the bowel.  There was no ischemia to the bowel and the bowel was pink at the end of the case.  Assessment  Ileus after emergent lyse adhesions for bowel obstruction  [facility] Stay = 3 days)  Plan:  Adv diet.    Pain control. - switch to PO  -VTE prophylaxis- SCDs, etc  -colostomy care  -mobilize as tolerated to help recovery  -need for cancer  surveillence CT scans per Dr Benay Spice with medical oncology.  Updated by him - she just needs CT chest IV only at this point - ED CT abd/pelvis adequate.  Will order for today while inpatient per his request  -will needs staples out next week   D/C patient from hospital when patient meets criteria (anticipate in 1 day(s)):  Tolerating oral intake well Ambulating well Adequate pain control without IV medications Urinating  Having flatus Disposition planning in place   Disposition:  Disposition:  The patient is from: Home  Anticipate discharge to:  Home  Anticipated Date of Discharge is:  December 22,2022    Barriers to discharge:  Pending Clinical improvement (more likely than not)  Patient currently is NOT MEDICALLY STABLE for discharge from the hospital from a surgery standpoint.      25 minutes spent in review, evaluation, examination, counseling, and coordination of care.   I have reviewed this patient's available data, including medical history, events of note, physical examination and test results as part of my evaluation.  A significant portion of that time was spent in counseling.  Care during the described time interval was provided by me.  12/07/2021    Subjective: (Chief complaint)  Sore with some crampiness but better overall.  Tolerating liquids  Walking a lot in hallways  Objective:  Vital signs:  Vitals:   12/06/21 0759 12/06/21 1558 12/06/21 2027 12/07/21 0415  BP: 124/83 105/73 108/75 (!) 151/95  Pulse: 67 68 63 65  Resp: 17 16 16    Temp: 98.2 F (36.8 C) 98 F (36.7 C) 98.4 F (36.9 C) 98.1 F (36.7 C)  TempSrc: Oral Oral  Oral Oral  SpO2: 100% 98% 99% 100%  Weight:      Height:        Last BM Date: 12/06/21  Intake/Output   Yesterday:  12/20 0701 - 12/21 0700 In: 480 [P.O.:480] Out: 2200 [Urine:2200] This shift:  No intake/output data recorded.  Bowel function:  Flatus: YES  BM:  No  Drain: (No drain)   Physical  Exam:  General: Pt awake/alert in no acute distress Eyes: PERRL, normal EOM.  Sclera clear.  No icterus Neuro: CN II-XII intact w/o focal sensory/motor deficits. Lymph: No head/neck/groin lymphadenopathy Psych:  No delerium/psychosis/paranoia.  Oriented x 4 HENT: Normocephalic, Mucus membranes moist.  No thrush Neck: Supple, No tracheal deviation.  No obvious thyromegaly Chest: No pain to chest wall compression.  Good respiratory excursion.  No audible wheezing CV:  Pulses intact.  Regular rhythm.  No major extremity edema MS: Normal AROM mjr joints.  No obvious deformity  Abdomen: Soft.  Mildy distended.  Tenderness at L side mild - no peritonitis .  No evidence of peritonitis.  No incarcerated hernias.  Ext:   No deformity.  No mjr edema.  No cyanosis Skin: No petechiae / purpurea.  No major sores.  Warm and dry    Results:   Cultures: Recent Results (from the past 720 hour(s))  Resp Panel by RT-PCR (Flu A&B, Covid) Nasopharyngeal Swab     Status: None   Collection Time: 12/03/21 10:02 PM   Specimen: Nasopharyngeal Swab; Nasopharyngeal(NP) swabs in vial transport medium  Result Value Ref Range Status   SARS Coronavirus 2 by RT PCR NEGATIVE NEGATIVE Final    Comment: (NOTE) SARS-CoV-2 target nucleic acids are NOT DETECTED.  The SARS-CoV-2 RNA is generally detectable in upper respiratory specimens during the acute phase of infection. The lowest concentration of SARS-CoV-2 viral copies this assay can detect is 138 copies/mL. A negative result does not preclude SARS-Cov-2 infection and should not be used as the sole basis for treatment or other patient management decisions. A negative result may occur with  improper specimen collection/handling, submission of specimen other than nasopharyngeal swab, presence of viral mutation(s) within the areas targeted by this assay, and inadequate number of viral copies(<138 copies/mL). A negative result must be combined with clinical  observations, patient history, and epidemiological information. The expected result is Negative.  Fact Sheet for Patients:  EntrepreneurPulse.com.au  Fact Sheet for Healthcare Providers:  IncredibleEmployment.be  This test is no t yet approved or cleared by the [place] FDA and  has been authorized for detection and/or diagnosis of SARS-CoV-2 by FDA under an Emergency Use Authorization (EUA). This EUA will remain  in effect (meaning this test can be used) for the duration of the COVID-19 declaration under Section 564(b)(1) of the Act, 21 U.S.C.section 360bbb-3(b)(1), unless the authorization is terminated  or revoked sooner.       Influenza A by PCR NEGATIVE NEGATIVE Final   Influenza B by PCR NEGATIVE NEGATIVE Final    Comment: (NOTE) The Xpert Xpress SARS-CoV-2/FLU/RSV plus assay is intended as an aid in the diagnosis of influenza from Nasopharyngeal swab specimens and should not be used as a sole basis for treatment. Nasal washings and aspirates are unacceptable for Xpert Xpress SARS-CoV-2/FLU/RSV testing.  Fact Sheet for Patients: EntrepreneurPulse.com.au  Fact Sheet for Healthcare Providers: IncredibleEmployment.be  This test is not yet approved or cleared by the [place] FDA and has been authorized for detection and/or diagnosis of SARS-CoV-2 by FDA under an Emergency Use Authorization (EUA).  This EUA will remain in effect (meaning this test can be used) for the duration of the COVID-19 declaration under Section 564(b)(1) of the Act, 21 U.S.C. section 360bbb-3(b)(1), unless the authorization is terminated or revoked.  Performed at [facility], [address] [place], [place] [address]     Labs: Results for orders placed or performed during the hospital encounter of 12/03/21 (from the past 48 hour(s))  Basic metabolic panel     Status: Abnormal   Collection Time: 12/06/21  3:32 AM   Result Value Ref Range   Sodium 141 135 - 145 mmol/L   Potassium 3.6 3.5 - 5.1 mmol/L   Chloride 109 98 - 111 mmol/L   CO2 26 22 - 32 mmol/L   Glucose, Bld 103 (H) 70 - 99 mg/dL    Comment: Glucose reference range applies only to samples taken after fasting for at least 8 hours.   BUN <5 (L) 6 - 20 mg/dL   Creatinine, Ser 0.65 0.44 - 1.00 mg/dL   Calcium 8.8 (L) 8.9 - 10.3 mg/dL   GFR, Estimated >60 >60 mL/min    Comment: (NOTE) Calculated using the CKD-EPI Creatinine Equation (2021)    Anion gap 6 5 - 15    Comment: Performed at [facility] [address] [place], [place] [address]  CBC     Status: Abnormal   Collection Time: 12/06/21  3:32 AM  Result Value Ref Range   WBC 5.7 4.0 - 10.5 K/uL   RBC 3.06 (L) 3.87 - 5.11 MIL/uL   Hemoglobin 9.6 (L) 12.0 - 15.0 g/dL   HCT 31.1 (L) 36.0 - 46.0 %   MCV 101.6 (H) 80.0 - 100.0 fL   MCH 31.4 26.0 - 34.0 pg   MCHC 30.9 30.0 - 36.0 g/dL   RDW 13.8 11.5 - 15.5 %   Platelets 173 150 - 400 K/uL   nRBC 0.0 0.0 - 0.2 %    Comment: Performed at [facility], [place] [address], [place], [place] [address]  Magnesium     Status: None   Collection Time: 12/06/21  3:32 AM  Result Value Ref Range   Magnesium 1.8 1.7 - 2.4 mg/dL    Comment: Performed at [facility] [address], [place], [place] [address]    Imaging / Studies: No results found.  Medications / Allergies: per chart  Antibiotics: Anti-infectives (From admission, onward)    Start     Dose/Rate Route Frequency Ordered Stop   12/03/21 2230  piperacillin-tazobactam (ZOSYN) IVPB 3.375 g        3.375 g 100 mL/hr over 30 Minutes Intravenous  Once 12/03/21 2226 12/03/21 2310         Note: Portions of this report may have been transcribed using voice recognition software. Every effort was made to ensure accuracy; however, inadvertent computerized transcription errors may be present.   Any transcriptional errors that result from this process are  unintentional.    [name], MD, FACS, MASCRS Esophageal, Gastrointestinal & Colorectal Surgery Robotic and Minimally Invasive Surgery  [facility], [place]  [place]. [address], [place], [place] [address] [phone] Fax [phone] Main  CONTACT INFORMATION:  Weekday (9AM-5PM): Call [facility] main office at [phone]  Weeknight (5PM-9AM) or Weekend/Holiday: Check [email or web address] (password " [email or web address]") for General Surgery [facility] coverage  (Please, do not use SecureChat as it is not reliable communication to operating surgeons for immediate  patient care)      12/07/2021  7:29 AM

## 2021-12-07 NOTE — Progress Notes (Signed)
Mobility Specialist Progress Note   12/07/21 1100  Mobility  Activity Ambulated in hall  Level of Assistance Independent after set-up  Assistive Device None (IV Pole)  Distance Ambulated (ft) 1100 ft  Mobility Ambulated independently in hallway  Mobility Response Tolerated well  Mobility performed by Mobility specialist  $Mobility charge 1 Mobility   Received pt in bed taking meds from RN, c/o of slight abdominal pain(6/10) but agreeable. Pt states having slight trunk flexion causes comfort while walking, otherwise asx throughout ambulation. Returned back to chair w/ call bell in reach.        Holland Falling Mobility Specialist Phone Number [phone]

## 2021-12-07 NOTE — Discharge Instructions (Signed)
EATING AFTER A SMALL BOWEL OBSTRUCTION   EAT START WITH PUREED OR SOFT FOODS Gradually transition to a high fiber diet with a fiber supplement over the next few days after discharge  WALK Walk an hour a day.  Control your pain to do that.    CONTROL PAIN Control pain so that you can walk, sleep, tolerate sneezing/coughing, go up/down stairs.  HAVE A BOWEL MOVEMENT DAILY Keep your bowels regular to avoid problems.  OK to try a laxative to override constipation.  OK to use an antidairrheal to slow down diarrhea.  Call if not better after 2 tries  CALL IF YOU HAVE PROBLEMS/CONCERNS Call if you are still struggling despite following these instructions. Call if you have concerns not answered by these instructions     After your attack of SMALL BOWEL OBSTRUCTION, expect some issues over the next few weeks.    To help you through this temporary phase, we start you out on a pureed (blenderized) diet.  Your first meal in the hospital was thin liquids.  You should have been given a pureed diet by the time you left the hospital.  We ask patients to stay on a pureed diet for the first few days to avoid anything getting "stuck."  Don't be alarmed if your ability to swallow doesn't progress according to this plan.  Everyone is different and some diets can advance more or less quickly.     Some BASIC RULES to follow are: Maintain an upright position whenever eating or drinking. Take small bites - just a teaspoon size bite at a time. Eat slowly.  It may also help to eat only one food at a time. Consider nibbling through smaller, more frequent meals & avoid the urge to eat BIG meals Do not push through feelings of fullness, nausea, or bloatedness Do not mix solid foods and liquids in the same mouthful Try not to "wash foods down" with large gulps of liquids. Avoid carbonated (bubbly/fizzy) drinks.   Avoid foods that make you feel gassy or bloated.  Start with bland foods first.  Wait on trying  greasy, fried, or spicy meals until you are tolerating more bland solids well. Expect to be more gassy/flatulent/bloated initially.  Walking will help your body manage it better. Consider using medications for bloating that contain simethicone such as  Maalox or Gas-X  Eat in a relaxed atmosphere & minimize distractions. Avoid talking while eating.   Do not use straws. Following each meal, sit in an upright position (90 degree angle) for 60 to 90 minutes.  Going for a short walk can help as well If food does stick, don't panic.  Try to relax and let the food pass on its own.  Sipping WARM LIQUID such as strong hot black tea can also help slide it down.   Be gradual in changes & use common sense:  -If you easily tolerating a certain "level" of foods, advance to the next level gradually -If you are having trouble swallowing a particular food, then avoid it.   -If food is sticking when you advance your diet, go back to thinner previous diet (the lower LEVEL) for 1-2 days.  LEVEL 1 = PUREED DIET  Do for the first [place] in this group are pureed or blenderized to a smooth, mashed potato-like consistency.  -If necessary, the pureed foods can keep their shape with the addition of a thickening agent.   -Meat should be pureed to a  smooth, pasty consistency.  Hot broth or gravy may be added to the pureed meat, approximately 1 oz. of liquid per 3 oz. serving of meat. -CAUTION:  If any foods do not puree into a smooth consistency, swallowing will be more difficult.  (For example, nuts or seeds sometimes do not blend well.)  Hot Foods Cold Foods  Pureed scrambled eggs and cheese Pureed cottage cheese  Baby cereals Thickened juices and nectars  Thinned cooked cereals (no lumps) Thickened milk or eggnog  Pureed [place] toast or pancakes Ensure  Mashed potatoes Ice cream  Pureed parsley, au gratin, scalloped potatoes, candied sweet potatoes Fruit or [place] ice,  sherbet  Pureed buttered or alfredo noodles Plain yogurt  Pureed vegetables (no corn or peas) Instant breakfast  Pureed soups and creamed soups Smooth pudding, mousse, custard  Pureed scalloped apples Whipped gelatin  Gravies Sugar, syrup, honey, jelly  Sauces, cheese, tomato, barbecue, white, creamed Cream  Any baby food Creamer  Alcohol in moderation (not beer or champagne) Margarine  Coffee or tea Mayonnaise   Ketchup, mustard   Apple sauce   SAMPLE MENU:  PUREED DIET Breakfast Lunch Dinner  Orange juice, 1/2 cup Cream of wheat, 1/2 cup Pineapple juice, 1/2 cup Pureed [place], barley soup, 3/4 cup Pureed Hawaiian chicken, 3 oz  Scrambled eggs, mashed or blended with cheese, 1/2 cup Tea or coffee, 1 cup  Whole milk, 1 cup  Non-dairy creamer, 2 Tbsp. Mashed potatoes, 1/2 cup Pureed cooled broccoli, 1/2 cup Apple sauce, 1/2 cup Coffee or tea Mashed potatoes, 1/2 cup Pureed spinach, 1/2 cup Frozen yogurt, 1/2 cup Tea or coffee      LEVEL 2 = SOFT DIET  After your first few days, you can advance to a soft, low residue diet.   Keep on this diet until everything goes down easily.  Hot Foods Cold Foods  White fish Cottage cheese  Stuffed fish Junior baby fruit  Baby food meals Semi thickened juices  Minced soft cooked, scrambled, poached eggs nectars  Souffle & omelets Ripe mashed bananas  Cooked cereals Canned fruit, pineapple sauce, milk  potatoes Milkshake  Buttered or Alfredo noodles Custard  Cooked cooled vegetable Puddings, including tapioca  Sherbet Yogurt  Vegetable soup or alphabet soup Fruit ice, [place] ice  Gravies Whipped gelatin  Sugar, syrup, honey, jelly Junior baby desserts  Sauces:  Cheese, creamed, barbecue, tomato, white Cream  Coffee or tea Margarine   SAMPLE MENU:  LEVEL 2 Breakfast Lunch Dinner  Orange juice, 1/2 cup Oatmeal, 1/2 cup Scrambled eggs with cheese, 1/2 cup Decaffeinated tea, 1 cup Whole milk, 1 cup Non-dairy creamer, 2 Tbsp  Pineapple juice, 1/2 cup Minced beef, 3 oz Gravy, 2 Tbsp Mashed potatoes, 1/2 cup Minced fresh broccoli, 1/2 cup Applesauce, 1/2 cup Coffee, 1 cup [place], barley soup, 3/4 cup Minced Hawaiian chicken, 3 oz Mashed potatoes, 1/2 cup Cooked spinach, 1/2 cup Frozen yogurt, 1/2 cup Non-dairy creamer, 2 Tbsp      LEVEL 3 = CHOPPED DIET  -After all the foods in level 2 (soft diet) are passing through well you should advance up to more chopped foods.  -It is still important to cut these foods into small pieces and eat slowly.  Hot Foods Cold Foods  Poultry Cottage cheese  Chopped Swedish meatballs Yogurt  Meat salads (ground or flaked meat) Milk  Flaked fish (tuna) Milkshakes  Poached or scrambled eggs Soft, cold, dry cereal  Souffles and omelets Fruit juices or nectars  Cooked cereals Chopped canned  fruit  Chopped [place] toast or pancakes Canned fruit cocktail  Noodles or pasta (no rice) Pudding, mousse, custard  Cooked vegetables (no frozen peas, corn, or mixed vegetables) Green salad  Canned small sweet peas Ice cream  Creamed soup or vegetable soup Fruit ice, [place] ice  Pureed vegetable soup or alphabet soup Non-dairy creamer  Ground scalloped apples Margarine  Gravies Mayonnaise  Sauces:  Cheese, creamed, barbecue, tomato, white Ketchup  Coffee or tea Mustard   SAMPLE MENU:  LEVEL 3 Breakfast Lunch Dinner  Orange juice, 1/2 cup Oatmeal, 1/2 cup Scrambled eggs with cheese, 1/2 cup Decaffeinated tea, 1 cup Whole milk, 1 cup Non-dairy creamer, 2 Tbsp Ketchup, 1 Tbsp Margarine, 1 tsp Salt, 1/4 tsp Sugar, 2 tsp Pineapple juice, 1/2 cup Ground beef, 3 oz Gravy, 2 Tbsp Mashed potatoes, 1/2 cup Cooked spinach, 1/2 cup Applesauce, 1/2 cup Decaffeinated coffee Whole milk Non-dairy creamer, 2 Tbsp Margarine, 1 tsp Salt, 1/4 tsp Pureed [place], barley soup, 3/4 cup Barbecue chicken, 3 oz Mashed potatoes, 1/2 cup Ground fresh broccoli, 1/2 cup Frozen yogurt, 1/2  cup Decaffeinated tea, 1 cup Non-dairy creamer, 2 Tbsp Margarine, 1 tsp Salt, 1/4 tsp Sugar, 1 tsp    LEVEL 4:  HIGH FIBER DIET / REGULAR FOODS  -Foods in this group are soft, moist, regularly textured foods.   -This level includes meat and breads, which tend to be the hardest things to swallow.   -Eat very slowly, chew well and continue to avoid carbonated drinks. -most people are at this level in 2-4 weeks  Hot Foods Cold Foods  Baked fish or skinned Soft cheeses - cottage cheese  Souffles and omelets Cream cheese  Eggs Yogurt  Stuffed shells Milk  Spaghetti with meat sauce Milkshakes  Cooked cereal Cold dry cereals (no nuts, dried fruit, coconut)  [place] toast or pancakes Crackers  Buttered toast Fruit juices or nectars  Noodles or pasta (no rice) Canned fruit  Potatoes (all types) Ripe bananas  Soft, cooked vegetables (no corn, lima, or baked beans) Peeled, ripe, fresh fruit  Creamed soups or vegetable soup Cakes (no nuts, dried fruit, coconut)  Canned chicken noodle soup Plain doughnuts  Gravies Ice cream  Bacon dressing Pudding, mousse, custard  Sauces:  Cheese, creamed, barbecue, tomato, white Fruit ice, [place] ice, sherbet  Decaffeinated tea or coffee Whipped gelatin  Pork chops Regular gelatin   Canned fruited gelatin molds   Sugar, syrup, honey, jam, jelly   Cream   Non-dairy   Margarine   Oil   Mayonnaise   Ketchup   Mustard   TROUBLESHOOTING IRREGULAR BOWELS  1) Avoid extremes of bowel movements (no bad constipation/diarrhea)  2) Miralax 17gm mixed in 8oz. water or juice-daily. May use BID as needed.  3) Gas-x,Phazyme, etc. as needed for gas & bloating.  4) Soft,bland diet. No spicy,greasy,fried foods.  5) Prilosec over-the-counter as needed  6) May hold gluten/wheat products from diet to see if symptoms improve.  7) May try probiotics (Align, Activa, etc) to help calm the bowels down  7) If symptoms become worse call back immediately.    If you  have any questions please call our office at [place]: [phone].     This information is not intended to replace advice given to you by your health care provider. Make sure you discuss any questions you have with your health care provider.      Bowel Obstruction A bowel obstruction is a blockage in the small or large bowel. The  bowel, which is also called the intestine, is a long, slender tube that connects the stomach to the anus. When a person eats and drinks, food and fluids go from the mouth to the stomach to the small bowel. This is where most of the nutrients in the food and fluids are absorbed. After the small bowel, material passes through the large bowel for further absorption until any leftover material leaves the body as stool through the anus during a bowel movement. A bowel obstruction will prevent food and fluids from passing through the bowel as they normally do during digestion. The bowel can become partially or completely blocked. If this condition is not treated, it can be dangerous because the bowel could rupture. What are the causes? Common causes of this condition include: Scar tissue (adhesions) from previous surgery or treatment with high-energy X-rays (radiation). Recent surgery. This may cause the movements of the bowel to slow down and cause food to block the intestine. Inflammatory bowel disease, such as Crohn's disease or diverticulitis. Growths or tumors. A bulging organ (hernia). Twisting of the bowel (volvulus). A foreign body. Slipping of a part of the bowel into another part (intussusception). What are the signs or symptoms? Symptoms of this condition include: Pain in the abdomen. Depending on the degree of obstruction, pain may be: Mild or severe. Dull cramping or sharp pain. In one area or in the entire abdomen. Nausea and vomiting. Vomit may be greenish or a yellow bile color. Bloating in the abdomen. Difficulty passing stool  (constipation). Lack of passing gas. Frequent belching. Diarrhea. This may occur if the obstruction is partial and runny stool is able to leak around the obstruction. How is this diagnosed? This condition may be diagnosed based on: A physical exam. Medical history. Imaging tests of the abdomen or pelvis, such as X-ray or CT scan. Blood or urine tests. How is this treated? Treatment for this condition depends on the cause and severity of the problem. Treatment may include: Fluids and pain medicines that are given through an IV. Your health care provider may instruct you not to eat or drink if you have nausea or vomiting. Eating a simple diet. You may be asked to consume a clear liquid diet for several days. This allows the bowel to rest. Placement of a small tube (nasogastric tube) into the stomach. This will relieve pain, discomfort, and nausea by removing blocked air and fluids from the stomach. It can also help the obstruction clear up faster. Surgery. This may be required if other treatments do not work. Surgery may be required for: Bowel obstruction from a hernia. This can be an emergency procedure. Scar tissue that causes frequent or severe obstructions. Follow these instructions at home: Medicines Take over-the-counter and prescription medicines only as told by your health care provider. If you were prescribed an antibiotic medicine, take it as told by your health care provider. Do not stop taking the antibiotic even if you start to feel better. General instructions Follow instructions from your health care provider about eating restrictions. You may need to avoid solid foods and consume only clear liquids until your condition improves. Return to your normal activities as told by your health care provider. Ask your health care provider what activities are safe for you. Avoid sitting for a long time without moving. Get up to take short walks every 1-2 hours. This is important to improve  blood flow and breathing. Ask for help if you feel weak or unsteady. Keep all follow-up visits  as told by your health care provider. This is important. How is this prevented? After having a bowel obstruction, you are more likely to have another. You may do the following things to prevent another obstruction: If you have a long-term (chronic) disease, pay attention to your symptoms and contact your health care provider if you have questions or concerns. Avoid becoming constipated. To prevent or treat constipation, your health care provider may recommend that you: Drink enough fluid to keep your urine pale yellow. Take over-the-counter or prescription medicines. Eat foods that are high in fiber, such as beans, whole grains, and fresh fruits and vegetables. Limit foods that are high in fat and processed sugars, such as fried or sweet foods. Stay active. Exercise for 30 minutes or more, 5 or more days each week. Ask your health care provider which exercises are safe for you. Avoid stress. Find ways to reduce stress, such as meditation, exercise, or taking time for activities that relax you. Instead of eating three large meals each day, eat three small meals with three small snacks. Work with a Microbiologist to make a healthy meal plan that works for you. Do not use any products that contain nicotine or tobacco, such as cigarettes and e-cigarettes. If you need help quitting, ask your health care provider. Contact a health care provider if you: Have a fever. Have chills. Get help right away if you: Have increased pain or cramping. Vomit blood. Have uncontrolled vomiting or nausea. Cannot drink fluids because of vomiting or pain. Become confused. Begin feeling very thirsty (dehydrated). Have severe bloating. Feel extremely weak or you faint. Summary A bowel obstruction is a blockage in the small or large bowel. A bowel obstruction will prevent food and fluids from passing through the bowel as they  normally do during digestion. Treatment for this condition depends on the cause and severity of the problem. It may include fluids and pain medicines through an IV, a simple diet, a nasogastric tube, or surgery. Follow instructions from your health care provider about eating restrictions. You may need to avoid solid foods and consume only clear liquids until your condition improves.  This information is not intended to replace advice given to you by your health care provider. Make sure you discuss any questions you have with your health care provider. Document Revised: 01/10/2019 Document Reviewed: 04/17/2018 Elsevier Patient Education  [place].   ########################   Managing Pain  ######################################################################   CONTROL PAIN Control pain so that you can walk, sleep, tolerate sneezing/coughing, go up/down stairs.  (Good pain control is not pain free only when lying still, unable to move)  WALK Walk an hour a day.  Control your pain to do that.   HAVE A BOWEL MOVEMENT DAILY Keep your bowels regular to avoid problems.  OK to try a laxative to override constipation.  OK to use an antidairrheal to slow down diarrhea.  Call if not better after 2 tries  CALL IF YOU HAVE PROBLEMS/CONCERNS Call if you are still struggling despite following these instructions. Call if you have concerns not answered by these instructions  ######################################################################    Pain after surgery or related to activity is often due to strain/injury to muscle, tendon, nerves and/or incisions.  This pain is usually short-term and will improve in a few months.   Many people find it helpful to do the following things TOGETHER to help speed the process of healing and to get back to regular activity more quickly:  Avoid heavy physical  activity at first No lifting greater than 20 pounds at first, then increase to lifting  as tolerated over the next few weeks Do not push through the pain.  Listen to your body and avoid positions and maneuvers than reproduce the pain.  Wait a few days before trying something more intense Walking is okay as tolerated, but go slowly and stop when getting sore.  If you can walk 30 minutes without stopping or pain, you can try more intense activity (running, jogging, aerobics, cycling, swimming, treadmill, sex, sports, weightlifting, etc ) Remember: If it hurts to do it, then dont do it!  Take Anti-inflammatory medication Choose ONE of the following over-the-counter medications:    Acetaminophen 555m tabs (Tylenol) 1-2 pills with every meal and just before bedtime (avoid if you have liver problems) Naproxen 228mtabs (ex. Aleve) 1-2 pills twice a day (avoid if you have kidney, stomach, IBD, or bleeding problems) Ibuprofen 200107mabs (ex. Advil, Motrin) 3-4 pills with every meal and just before bedtime (avoid if you have kidney, stomach, IBD, or bleeding problems) Take with food/snack around the clock for 1-2 weeks This helps the muscle and nerve tissues become less irritable and calm down faster  Use a Heating pad or Ice/Cold Pack 4-6 times a day May use warm bath/hottub  or showers  Try Gentle Massage and/or Stretching  at the area of pain many times a day stop if you feel pain - do not overdo it  Try these steps together to help you body heal faster and avoid making things get worse.  Doing just one of these things may not be enough.    If you are not getting better after two weeks or are noticing you are getting worse, contact our office for further advice; we may need to re-evaluate you & see what other things we can do to help.   #########################   #######################################################  Ostomy Support Information  YouTheresia Majorsard that people get along just fine with only one of their eyes, or one of their lungs, or one of their kidneys. But  you also know that you have only one intestine and only one bladder, and that leaves you feeling awfully empty, both physically and emotionally: You think no other people go around without part of their intestine with the ends of their intestines sticking out through their abdominal walls.   YOU ARE NOT ALONE.  There are nearly three quarters of a million people in the [place] have an ostomy; people who have had surgery to remove all or part of their colons or bladders.   There is even a national association, the [employer] over 350 local affiliated support groups that are organized by volunteers who provide peer support and counseling. [employer] a toll free telephone num-ber, [phone] an educational, interactive website, www.ostomy.org   An ostomy is an opening in the belly (abdominal wall) made by surgery. Ostomates are people who have had this procedure. The opening (stoma) allows the kidney or bowel to grdischarge waste. An external pouch covers the stoma to collect waste. Pouches are are a simple bag and are odor free. Different companies have disposable or reusable pouches to fit one's lifestyle. An ostomy can either be temporary or permanent.   THERE ARE THREE MAIN TYPES OF OSTOMIES Colostomy. A colostomy is a surgically created opening in the large intestine (colon). Ileostomy. An ileostomy is a surgically created opening in the small intestine. Urostomy. A urostomy is a  surgically created opening to divert urine away from the bladder.  OSTOMY Care  The following guidelines will make care of your colostomy easier. Keep this information close by for quick reference.  Helpful DIET hints Eat a well-balanced diet including vegetables and fresh fruits. Eat on a regular schedule.  Drink at least 6 to 8 glasses of fluids daily. Eat slowly in a relaxed atmosphere. Chew your food thoroughly. Avoid chewing gum, smoking, and drinking from a straw. This will help  decrease the amount of air you swallow, which may help reduce gas. Eating yogurt or drinking buttermilk may help reduce gas.  To control gas at night, do not eat after 8 p.m. This will give your bowel time to quiet down before you go to bed.  If gas is a problem, you can purchase Beano. Sprinkle Beano on the first bite of food before eating to reduce gas. It has no flavor and should not change the taste of your food. You can buy Beano over the counter at your local drugstore.  Foods like fish, onions, garlic, broccoli, asparagus, and cabbage produce odor. Although your pouch is odor-proof, if you eat these foods you may notice a stronger odor when emptying your pouch. If this is a concern, you may want to limit these foods in your diet.  If you have an ileostomy, you will have chronic diarrhea & need to drink more liquids to avoid getting dehydrated.  Consider antidiarrheal medicine like imodium (loperamide) or Lomotil to help slow down bowel movements / diarrhea into your ileostomy bag.  GETTING TO GOOD BOWEL HEALTH WITH AN ILEOSTOMY    With the colon bypassed & not in use, you will have small bowel diarrhea.   It is important to thicken & slow your bowel movements down.   The goal: 4-6 small BOWEL MOVEMENTS A DAY It is important to drink plenty of liquids to avoid getting dehydrated  CONTROLLING ILEOSTOMY DIARRHEA  TAKE A FIBER SUPPLEMENT (FiberCon or Benefiner soluble fiber) twice a day - to thicken stools by absorbing excess fluid and retrain the intestines to act more normally.  Slowly increase the dose over a few weeks.  Too much fiber too soon can backfire and cause cramping & bloating.  TAKE AN IRON SUPPLEMENT twice a day to naturally constipate your bowels.  Usually ferrous sulfate 368m twice a day)  TAKE ANTI-DIARRHEAL MEDICINES: Loperamide (Imodium) can slow down diarrhea.  Start with two tablets (= 453m first and then try one tablet every 6 hours.  Can go up to 2 pills four  times day (8 pills of 34m18max) Avoid if you are having fevers or severe pain.  If you are not better or start feeling worse, stop all medicines and call your doctor for advice LoMotil (Diphenoxylate / Atropine) is another medicine that can constipate & slow down bowel moevements Pepto Bismol (bismuth) can gently thicken bowels as well  If diarrhea is worse,: drink plenty of liquids and try simpler foods for a few days to avoid stressing your intestines further. Avoid dairy products (especially milk & ice cream) for a short time.  The intestines often can lose the ability to digest lactose when stressed. Avoid foods that cause gassiness or bloating.  Typical foods include beans and other legumes, cabbage, broccoli, and dairy foods.  Every person has some sensitivity to other foods, so listen to our body and avoid those foods that trigger problems for you.Call your doctor if you are getting worse or not better.  Sometimes further testing (cultures, endoscopy, X-ray studies, bloodwork, etc) may be needed to help diagnose and treat the cause of the diarrhea. Take extra anti-diarrheal medicines (maximum is 8 pills of $Remove'2mg'LnKbOpm$  loperamide a day)   Tips for POUCHING an OSTOMY   Changing Your Pouch The best time to change your pouch is in the morning, before eating or drinking anything. Your stoma can function at any time, but it will function more after eating or drinking.   Applying the pouching system  Place all your equipment close at hand before removing your pouch.  Wash your hands.  Stand or sit in front of a mirror. Use the position that works best for you. Remember that you must keep the skin around the stoma wrinkle-free for a good seal.  Gently remove the used pouch (1-piece system) or the pouch and old wafer (2-piece system). Empty the pouch into the toilet. Save the closure clip to use again.  Wash the stoma itself and the skin around the stoma. Your stoma may bleed a little when being  washed. This is normal. Rinse and pat dry. You may use a wash cloth or soft paper towels (like Bounty), mild soap (like Dial, Safeguard, or [place]), and water. Avoid soaps that contain perfumes or lotions.  For a new pouch (1-piece system) or a new wafer (2-piece system), measure your stoma using the stoma guide in each box of supplies.  Trace the shape of your stoma onto the back of the new pouch or the back of the new wafer. Cut out the opening. Remove the paper backing and set it aside.  Optional: Apply a skin barrier powder to surrounding skin if it is irritated (bare or weeping), and dust off the excess. Optional: Apply a skin-prep wipe (such as Skin Prep or All-Kare) to the skin around the stoma, and let it dry. Do not apply this solution if the skin is irritated (red, tender, or broken) or if you have shaved around the stoma. Optional: Apply a skin barrier paste (such as Stomahesive, Coloplast, or Premium) around the opening cut in the back of the pouch or wafer. Allow it to dry for 30 to 60 seconds.  Hold the pouch (1-piece system) or wafer (2-piece system) with the sticky side toward your body. Make sure the skin around the stoma is wrinkle-free. Center the opening on the stoma, then press firmly to your abdomen (Fig. 4). Look in the mirror to check if you are placing the pouch, or wafer, in the right position. For a 2-piece system, snap the pouch onto the wafer. Make sure it snaps into place securely.  Place your hand over the stoma and the pouch or wafer for about 30 seconds. The heat from your hand can help the pouch or wafer stick to your skin.  Add deodorant (such as Super Banish or Nullo) to your pouch. Other options include food extracts such as vanilla oil and peppermint extract. Add about 10 drops of the deodorant to the pouch. Then apply the closure clamp. Note: Do not use toxic  chemicals or commercial cleaning agents in your pouch. These substances may harm the stoma.  Optional:  For extra seal, apply tape to all 4 sides around the pouch or wafer, as if you were framing a picture. You may use any brand of medical adhesive tape. Change your pouch every 5 to 7 days. Change it immediately if a leak occurs.  Wash your hands afterwards.  If you are wearing a 2-piece system,  you may use 2 new pouches per week and alternate them. Rinse the pouch with mild soap and warm water and hang it to dry for the next day. Apply the fresh pouch. Alternate the 2 pouches like this for a week. After a week, change the wafer and begin with 2 new pouches. Place the old pouches in a plastic bag, and put them in the trash.   LIVING WITH AN OSTOMY  Emptying Your Pouch Empty your pouch when it is one-third full (of urine, stool, and/or gas). If you wait until your pouch is fuller than this, it will be more difficult to empty and more noticeable. When you empty your pouch, either put toilet paper in the toilet bowl first, or flush the toilet while you empty the pouch. This will reduce splashing. You can empty the pouch between your legs or to one side while sitting, or while standing or stooping. If you have a 2-piece system, you can snap off the pouch to empty it. Remember that your stoma may function during this time. If you wish to rinse your pouch after you empty it, a [place] baster can be helpful. When using a baster, squirt water up into the pouch through the opening at the bottom. With a 2-piece system, you can snap off the pouch to rinse it. After rinsing  your pouch, empty it into the toilet. When rinsing your pouch at home, put a few granules of Dreft soap in the rinse water. This helps lubricate and freshen your pouch. The inside of your pouch can be sprayed with non-stick cooking oil (Pam spray). This may help reduce stool sticking to the inside of the pouch.  Bathing You may shower or bathe with your pouch on or off. Remember that your stoma may function during this time.  The materials  you use to wash your stoma and the skin around it should be clean, but they do not need to be sterile.  Wearing Your Pouch During hot weather, or if you perspire a lot in general, wear a cover over your pouch. This may prevent a rash on your skin under the pouch. Pouch covers are sold at ostomy supply stores. Wear the pouch inside your underwear for better support. Watch your weight. Any gain or loss of 10 to 15 pounds or more can change the way your pouch fits.  Going Away From Home A collapsible cup (like those that come in travel kits) or a soft plastic squirt bottle with a pull-up top (like a travel bottle for shampoo) can be used for rinsing your pouch when you are away from home. Tilt the opening of the pouch at an upward angle when using a cup to rinse.  Carry wet wipes or extra tissues to use in public bathrooms.  Carry an extra pouching system with you at all times.  Never keep ostomy supplies in the glove compartment of your car. Extreme heat or cold can damage the skin barriers and adhesive wafers on the pouch.  When you travel, carry your ostomy supplies with you at all times. Keep them within easy reach. Do not pack ostomy supplies in baggage that will be checked or otherwise separated from you, because your baggage might be lost. If youre traveling out of the country, it is helpful to have a letter stating that you are carrying ostomy supplies as a medical necessity.  If you need ostomy supplies while traveling, look in the yellow pages of the telephone book under Surgical Supplies.  Or call the local ostomy organization to find out where supplies are available.  Do not let your ostomy supplies get low. Always order new pouches before you use the last one.  Reducing Odor Limit foods such as broccoli, cabbage, onions, fish, and garlic in your diet to help reduce odor. Each time you empty your pouch, carefully clean the opening of the pouch, both inside and outside, with toilet  paper. Rinse your pouch 1 or 2 times daily after you empty it (see directions for emptying your pouch and going away from home). Add deodorant (such as Super Banish or Nullo) to your pouch. Use air deodorizers in your bathroom. Do not add aspirin to your pouch. Even though aspirin can help prevent odor, it could cause ulcers on your stoma.  When to call the doctor Call the doctor if you have any of the following symptoms: Purple, black, or white stoma Severe cramps lasting more than 6 hours Severe watery discharge from the stoma lasting more than 6 hours No output from the colostomy for 3 days Excessive bleeding from your stoma Swelling of your stoma to more than 1/2-inch larger than usual Pulling inward of your stoma below skin level Severe skin irritation or deep ulcers Bulging or other changes in your abdomen  When to call your ostomy nurse Call your ostomy/enterostomal therapy (WOCN) nurse if any of the following occurs: Frequent leaking of your pouching system Change in size or appearance of your stoma, causing discomfort or problems with your pouch Skin rash or rawness Weight gain or loss that causes problems with your pouch     FREQUENTLY ASKED QUESTIONS   Why havent you met any of these folks who have an ostomy?  Well, maybe you have! You just did not recognize them because an ostomy doesn't show. It can be kept secret if you wish. Why, maybe some of your best friends, office associates or neighbors have an ostomy ... you never can tell. People facing ostomy surgery have many quality-of-life questions like: Will you bulge? Smell? Make noises? Will you feel waste leaving your body? Will you be a captive of the toilet? Will you starve? Be a social outcast? Get/stay married? Have babies? Easily bathe, go swimming, bend over?  OK, lets look at what you can expect:   Will you bulge?  Remember, without part of the intestine or bladder, and its contents, you should have a  flatter tummy than before. You can expect to wear, with little exception, what you wore before surgery ... and this in-cludes tight clothing and bathing suits.   Will you smell?  Today, thanks to modern odor proof pouching systems, you can walk into an ostomy support group meeting and not smell anything that is foul or offensive. And, for those with an ileostomy or colostomy who are concerned about odor when emptying their pouch, there are in-pouch deodorants that can be used to eliminate any waste odors that may exist.   Will you make noises?  Everyone produces gas, especially if they are an air-swallower. But intestinal sounds that occur from time to time are no differ-ent than a gurgling tummy, and quite often your clothing will muffle any sounds.   Will you feel the waste discharges?  For those with a colostomy or ileostomy there might be a slight pressure when waste leaves your body, but understand that the intestines have no nerve endings, so there will be no unpleasant sensations. Those with a urostomy will probably be unaware of any  kidney drainage.   Will you be a captive of the toilet?  Immediately post-op you will spend more time in the bathroom than you will after your body recovers from surgery. Every person is different, but on average those with an ileostomy or urostomy may empty their pouches 4 to 6 times a day; a little  less if you have a colostomy. The average wear time between pouch system changes is 3 to 5 days and the changing process should take less than 30 minutes.   Will I need to be on a special diet? Most people return to their normal diet when they have recovered from surgery. Be sure to chew your food well, eat a well-balanced diet and drink plenty of fluids. If you experience problems with a certain food, wait a couple of weeks and try it again.  Will there be odor and noises? Pouching systems are designed to be odor-proof or odor-resistant. There are deodorants that  can be used in the pouch. Medications are also available to help reduce odor. Limit gas-producing foods and carbonated beverages. You will experience less gas and fewer noises as you heal from surgery.  How much time will it take to care for my ostomy? At first, you may spend a lot of time learning about your ostomy and how to take care of it. As you become more comfortable and skilled at changing the pouching system, it will take very little time to care for it.   Will I be able to return to work? People with ostomies can perform most jobs. As soon as you have healed from surgery, you should be able to return to work. Heavy lifting (more than 10 pounds) may be discouraged.   What about intimacy? Sexual relationships and intimacy are important and fulfilling aspects of your life. They should continue after ostomy surgery. Intimacy-related concerns should be discussed openly between you and your partner.   Can I wear regular clothing? You do not need to wear special clothing. Ostomy pouches are fairly flat and barely noticeable. Elastic undergarments will not hurt the stoma or prevent the ostomy from functioning.   Can I participate in sports? An ostomy should not limit your involvement in sports. Many people with ostomies are runners, skiers, swimmers or participate in other active lifestyles. Talk with your caregiver first before doing heavy physical activity.  Will you starve?  Not if you follow doctors orders at each stage of your post-op adjustment. There is no such thing as an ostomy diet. Some people with an ostomy will be able to eat and tolerate anything; others may find diffi-culty with some foods. Each person is an individual and must determine, by trial, what is best for them. A good practice for all is to drink plenty of water.   Will you be a social outcast?  Have you met anyone who has an ostomy and is a social outcast? Why should you be the first? Only your attitude and self  image will effect how you are treated. No confi-dent person is an Occupational psychologist.    PROFESSIONAL HELP   Resources are available if you need help or have questions about your ostomy.   Specially trained nurses called Wound, Ostomy Continence Nurses (WOCN) are available for consultation in most major medical centers.  Consider getting an ostomy consult at an outpatient ostomy clinic.   [place] [facility] has an [facility] run by an Programmer, systems at the [facility].  [phone]. [facility]  can help set up an appointment   The United Ostomy Association (UOA) is a group made up of many local chapters throughout the [place]. These local groups hold meetings and provide support to prospective and existing ostomates. They sponsor educational events and have qualified visitors to make personal or telephone visits. Contact the UOA for the chapter nearest you and for other educational publications.  More detailed information can be found in Colostomy Guide, a publication of the [employer] (UOA). Contact UOA at 1-445-489-0250 or visit their web site at [email or web address].[email or web address]/. The website contains links to other sites, suppliers and resources.  [facility] Start Services: Start at the website to enlist for support.  Your Wound Ostomy (WOCN) nurse may have started this process. https://www.hollister.com/en/securestart Secure Start services are designed to support people as they live their lives with an ostomy or neurogenic bladder. Enrolling is easy and at no cost to the patient. We realize that each person's needs and life journey are different. Through Secure Start services, we want to help people live their life, their way.  #######################################################

## 2021-12-08 NOTE — Discharge Summary (Signed)
Physician Discharge Summary  Patient ID: Beth [name] MRN: [id number] DOB/AGE: 1971/07/04 50 y.o.  PCP: Caryl Bis, MD  Admit date: 12/03/2021 Discharge date: 12/08/2021  Admission Diagnoses:  small bowel obstruction  Discharge Diagnoses:  same post exploratory laparotomy and enterolysis by Dr. Rosendo Gros  Principal Problem:   SBO (small bowel obstruction) s/p lysis of adhesions 12/04/2021 Active Problems:   Squamous cell carcinoma of anal canal (HCC)   S/P exploratory laparotomy   Surgery:  exploratory laparotomy and enterolysis  Discharged Condition: improved  Hospital Course:   Had surgery by Dr. Francia Greaves is followed by Dr. Johney Maine who performed an APR on her.    Consults: none  Significant Diagnostic Studies: none noted    Discharge Exam: Blood pressure 124/84, pulse 68, temperature 98.4 F (36.9 C), temperature source Oral, resp. rate 18, height 5\' 5"  (1.651 m), weight 67.1 kg, last menstrual period 03/13/2012, SpO2 97 %. Staples in place beside colostomy;  staple line is not red.  Advised to cover with neosporin until staples removed next week.    Disposition: Discharge disposition: 01-Home or Self Care       Discharge Instructions     Call MD for:   Complete by: As directed    FEVER > 101.5 F  (temperatures < 101.5 F are not significant)   Call MD for:  extreme fatigue   Complete by: As directed    Call MD for:  persistant dizziness or light-headedness   Complete by: As directed    Call MD for:  persistant nausea and vomiting   Complete by: As directed    Call MD for:  redness, tenderness, or signs of infection (pain, swelling, redness, odor or green/yellow discharge around incision site)   Complete by: As directed    Call MD for:  redness, tenderness, or signs of infection (pain, swelling, redness, odor or green/yellow discharge around incision site)   Complete by: As directed    Call MD for:  severe uncontrolled pain   Complete by: As  directed    Diet - low sodium heart healthy   Complete by: As directed    Start with a bland diet such as soups, liquids, starchy foods, low fat foods, etc. the first few days at home. Gradually advance to a solid, low-fat, high fiber diet by the end of the first week at home.   Add a fiber supplement to your diet (Metamucil, etc) If you feel full, bloated, or constipated, stay on a full liquid or pureed/blenderized diet for a few days until you feel better and are no longer constipated.   Diet - low sodium heart healthy   Complete by: As directed    Discharge instructions   Complete by: As directed    See Discharge Instructions If you are not getting better after two weeks or are noticing you are getting worse, contact our office [phone] for further advice.  We may need to adjust your medications, re-evaluate you in the office, send you to the emergency room, or see what other things we can do to help. The clinic staff is available to answer your questions during regular business hours (8:30am-5pm).  Please don't hesitate to call and ask to speak to one of our nurses for clinical concerns.    A surgeon from [facility] is always on call at the hospitals 24 hours/day If you have a medical emergency, go to the nearest emergency room or call 911.   Discharge instructions  Complete by: As directed    Apply neosporin ointment along staple line until discharge next week   Discharge wound care:   Complete by: As directed    It is good for closed incisions and even open wounds to be washed every day.  Shower every day.  Short baths are fine.  Wash the incisions and wounds clean with soap & water.    You may leave closed incisions open to air if it is dry.   You may cover the incision with clean gauze & replace it after your daily shower for comfort.  STAPLES: You have skin staples.  Leave them in place & set up an appointment for them to be removed by a surgery office nurse  next week, ~10 days after surgery.   Driving Restrictions   Complete by: As directed    You may drive when: - you are no longer taking narcotic prescription pain medication - you can comfortably wear a seatbelt - you can safely make sudden turns/stops without pain.   Increase activity slowly   Complete by: As directed    Start light daily activities --- self-care, walking, climbing stairs- beginning the day after surgery.  Gradually increase activities as tolerated.  Control your pain to be active.  Stop when you are tired.  Ideally, walk several times a day, eventually an hour a day.   Most people are back to most day-to-day activities in a few weeks.  It takes 4-6 weeks to get back to unrestricted, intense activity. If you can walk 30 minutes without difficulty, it is safe to try more intense activity such as jogging, treadmill, bicycling, low-impact aerobics, swimming, etc. Save the most intensive and strenuous activity for last (Usually 4-8 weeks after surgery) such as sit-ups, heavy lifting, contact sports, etc.  Refrain from any intense heavy lifting or straining until you are off narcotics for pain control.  You will have off days, but things should improve week-by-week. DO NOT PUSH THROUGH PAIN.  Let pain be your guide: If it hurts to do something, don't do it.   Increase activity slowly   Complete by: As directed    Lifting restrictions   Complete by: As directed    If you can walk 30 minutes without difficulty, it is safe to try more intense activity such as jogging, treadmill, bicycling, low-impact aerobics, swimming, etc. Save the most intensive and strenuous activity for last (Usually 4-8 weeks after surgery) such as sit-ups, heavy lifting, contact sports, etc.   Refrain from any intense heavy lifting or straining until you are off narcotics for pain control.  You will have off days, but things should improve week-by-week. DO NOT PUSH THROUGH PAIN.  Let pain be your guide: If it  hurts to do something, don't do it.  Pain is your body warning you to avoid that activity for another week until the pain goes down.   May shower / Bathe   Complete by: As directed    May walk up steps   Complete by: As directed    Remove dressing in 72 hours   Complete by: As directed    Make sure all dressings are removed by the third day after surgery.  Leave incisions open to air.  OK to cover incisions with gauze or bandages as desired   Sexual Activity Restrictions   Complete by: As directed    You may have sexual intercourse when it is comfortable. If it hurts to do something, stop.  Allergies as of 12/08/2021       Reactions   Clindamycin/lincomycin Rash   Sulfa Drugs Cross Reactors Itching, Rash        Medication List     TAKE these medications    acetaminophen 500 MG tablet Commonly known as: TYLENOL Take 500 mg by mouth every 6 (six) hours as needed for mild pain.   cetirizine 10 MG tablet Commonly known as: ZYRTEC Take 10 mg by mouth daily.   cyclobenzaprine 5 MG tablet Commonly known as: FLEXERIL Take 5 mg by mouth 3 (three) times daily as needed for muscle spasms.   gabapentin 300 MG capsule Commonly known as: NEURONTIN Take 1 capsule (300 mg total) by mouth 3 (three) times daily for 3 days.   HAIR SKIN & NAILS GUMMIES PO Take 2 tablets by mouth daily. Gummies   ondansetron 4 MG disintegrating tablet Commonly known as: ZOFRAN-ODT Take 4 mg by mouth every 8 (eight) hours as needed for nausea/vomiting.   oxyCODONE 5 MG immediate release tablet Commonly known as: Oxy IR/ROXICODONE Take 1-2 tablets (5-10 mg total) by mouth every 6 (six) hours as needed for moderate pain, severe pain or breakthrough pain.   spironolactone 100 MG tablet Commonly known as: ALDACTONE Take 100 mg by mouth daily.   TRIAMCINOLONE ACETONIDE (TOP) 0.05 % Oint Apply 1 application topically 4 (four) times daily as needed (eczema).               Discharge Care  Instructions  (From admission, onward)           Start     Ordered   12/07/21 0000  Discharge wound care:       Comments: It is good for closed incisions and even open wounds to be washed every day.  Shower every day.  Short baths are fine.  Wash the incisions and wounds clean with soap & water.    You may leave closed incisions open to air if it is dry.   You may cover the incision with clean gauze & replace it after your daily shower for comfort.  STAPLES: You have skin staples.  Leave them in place & set up an appointment for them to be removed by a surgery office nurse next week, ~10 days after surgery.   12/07/21 0732            Follow-up [facility] Follow up in 5 day(s).   Specialty: General Surgery Why: To have sutures/staples removed from incision(s) Contact information: [address] [place] [phone]        Michael Boston, MD Follow up in 3 week(s).   Specialties: General Surgery, Colon and Rectal Surgery Why: To follow up after your operation Contact information: [address] [address] [phone]                 Signed: Pedro Earls 12/08/2021, 8:17 AM

## 2021-12-08 NOTE — Progress Notes (Signed)
Discharge instructions (including medications) discussed with and copy provided to patient/caregiver 

## 2021-12-08 NOTE — Plan of Care (Signed)
  Problem: Education: Goal: Knowledge of General Education information will improve Description: Including pain rating scale, medication(s)/side effects and non-pharmacologic comfort measures Outcome: Adequate for Discharge   

## 2021-12-13 ENCOUNTER — Ambulatory Visit (HOSPITAL_BASED_OUTPATIENT_CLINIC_OR_DEPARTMENT_OTHER): Admission: RE | Admit: 2021-12-13 | Payer: BC Managed Care – PPO | Source: Ambulatory Visit

## 2021-12-15 DIAGNOSIS — C20 Malignant neoplasm of rectum: Secondary | ICD-10-CM | POA: Diagnosis not present

## 2021-12-15 DIAGNOSIS — Z933 Colostomy status: Secondary | ICD-10-CM | POA: Diagnosis not present

## 2021-12-23 ENCOUNTER — Other Ambulatory Visit: Payer: Self-pay

## 2021-12-23 ENCOUNTER — Ambulatory Visit (HOSPITAL_COMMUNITY)
Admission: RE | Admit: 2021-12-23 | Discharge: 2021-12-23 | Disposition: A | Discharge status: Home / Self Care | Payer: BC Managed Care – PPO | Source: Ambulatory Visit | Attending: Nurse Practitioner | Admitting: Nurse Practitioner

## 2021-12-23 DIAGNOSIS — Z933 Colostomy status: Secondary | ICD-10-CM | POA: Diagnosis not present

## 2021-12-23 DIAGNOSIS — L24B1 Irritant contact dermatitis related to digestive stoma or fistula: Secondary | ICD-10-CM

## 2021-12-23 DIAGNOSIS — K9400 Colostomy complication, unspecified: Secondary | ICD-10-CM

## 2021-12-26 NOTE — Progress Notes (Signed)
[facility]   Reason for visit:  LUQ colostomy HPI:  Anal squamous cell carcinoma with colostomy ROS  Review of Systems  All other systems reviewed and are negative. Vital signs:  BP (!) 141/103    Pulse 91    Temp 98.3 F (36.8 C) (Oral)    Resp 18    LMP 03/13/2012    SpO2 99%  Exam:  Physical Exam Constitutional:      Appearance: She is normal weight.  Abdominal:     General: Abdomen is flat.  Neurological:     Mental Status: She is alert.    Stoma type/location:  LUQ colostomy Stomal assessment/size:  1 3/8" pink and producing soft brown stool Peristomal assessment:  intact Treatment options for stomal/peristomal skin: barrier ring, 2 piece convex pouch with belt.  Switched to [employer]: soft brown stool Ostomy pouching:2pc. 1 3/8" pouch with barrier ring and belt.   Education provided:  Performed pouch change with new coloplast supplies.  Provided with samples. Will update [facility] with new orders and enroll in coloplast patient program.       Impression/dx  Contact dermatitis  Leaking appliance Discussion  Switch to new pouch Plan  See above New pouch Will call [facility]    Visit time: 60 minutes.   Domenic Moras FNP-BC

## 2021-12-27 NOTE — Discharge Instructions (Signed)
We have switched to 2 piece convex presized pouches Coloplast Added belt and barrier ring Skin prep and powder as needed.  Call clinic  as needed,  I will update [facility].

## 2022-01-05 DIAGNOSIS — Z933 Colostomy status: Secondary | ICD-10-CM | POA: Diagnosis not present

## 2022-01-05 DIAGNOSIS — C20 Malignant neoplasm of rectum: Secondary | ICD-10-CM | POA: Diagnosis not present

## 2022-01-10 DIAGNOSIS — Z933 Colostomy status: Secondary | ICD-10-CM | POA: Diagnosis not present

## 2022-01-10 DIAGNOSIS — C20 Malignant neoplasm of rectum: Secondary | ICD-10-CM | POA: Diagnosis not present

## 2022-02-03 DIAGNOSIS — C20 Malignant neoplasm of rectum: Secondary | ICD-10-CM | POA: Diagnosis not present

## 2022-02-03 DIAGNOSIS — Z933 Colostomy status: Secondary | ICD-10-CM | POA: Diagnosis not present

## 2022-02-10 ENCOUNTER — Telehealth: Payer: Self-pay

## 2022-02-10 NOTE — Telephone Encounter (Signed)
Pt calling; takes gabapentin for hot flashes; has one refill left; can we refill or does she need to be seen.  [phone]  pt states she is trying to find a doctor closer to where she lives but they can't see her until April; adv to schedule appt and I'd see if a provider would refill until her appt.  Pt to call and leave msg on nurse line.

## 2022-02-16 NOTE — Telephone Encounter (Signed)
Pt hasn't called back; will close msg. ?

## 2022-02-27 ENCOUNTER — Encounter: Payer: Self-pay | Admitting: Nurse Practitioner

## 2022-02-27 ENCOUNTER — Inpatient Hospital Stay: Payer: BC Managed Care – PPO | Attending: Nurse Practitioner | Admitting: Nurse Practitioner

## 2022-02-27 ENCOUNTER — Other Ambulatory Visit: Payer: Self-pay

## 2022-02-27 VITALS — BP 130/91 | HR 94 | Temp 98.1°F | Resp 19 | Ht 65.0 in | Wt 157.6 lb

## 2022-02-27 DIAGNOSIS — C211 Malignant neoplasm of anal canal: Secondary | ICD-10-CM | POA: Insufficient documentation

## 2022-02-27 DIAGNOSIS — Z933 Colostomy status: Secondary | ICD-10-CM | POA: Insufficient documentation

## 2022-02-27 DIAGNOSIS — C210 Malignant neoplasm of anus, unspecified: Secondary | ICD-10-CM

## 2022-02-27 NOTE — Progress Notes (Signed)
?[place] ?OFFICE PROGRESS NOTE ? ? ?Diagnosis: Anal cancer ? ?INTERVAL HISTORY:  ? ?Beth [name] returns as scheduled.  She continues to have discomfort at the right abdomen.  This has been ongoing since the surgery November 2022 and is unchanged.  Colostomy is functioning well.  She reports a good appetite.  For the last few weeks she feels as if she has a "tear" at the left buttock. ? ?Objective: ? ?Vital signs in last 24 hours: ? ?Blood pressure (!) 130/91, pulse 94, temperature 98.1 ?F (36.7 ?C), temperature source Oral, resp. rate 19, height 5\' 5"  (1.651 m), weight 157 lb 9.6 oz (71.5 kg), last menstrual period 03/13/2012, SpO2 98 %. ?  ? ?Lymphatics: No palpable cervical, supraclavicular, axillary or inguinal lymph nodes. ?Resp: Lungs clear bilaterally. ?Cardio: Regular rate and rhythm. ?GI: Abdomen soft, tender at the right abdomen.  Healed midline surgical scar.  No hepatosplenomegaly.  Left lower quadrant colostomy. ?Vascular: No leg edema. ?Skin: Perineal scar appears healed.  No skin breakdown. ? ? ? ?Lab Results: ? ?Lab Results  ?Component Value Date  ? WBC 5.7 12/06/2021  ? HGB 9.6 (L) 12/06/2021  ? HCT 31.1 (L) 12/06/2021  ? MCV 101.6 (H) 12/06/2021  ? PLT 173 12/06/2021  ? NEUTROABS 10.9 (H) 12/03/2021  ? ? ?Imaging: ? ?No results found. ? ?Medications: I have reviewed the patient's current medications. ? ?Assessment/Plan: ?Squamous cell carcinoma of the anal canal ?Colonoscopy 01/28/2021-rectal mass extending to the "outside ", palpable on exam-biopsy revealed high-grade squamous intraepithelial lesion (AIN 3/carcinoma in situ) ?Exam under anesthesia with transanal excision of an anal canal mass 02/25/2021-frozen section consistent with squamous cell carcinoma, 4 x 4 centimeter sessile anal canal mass extending to the anal verge, right lateral and posterior, 30% circumferential, fixed to the anal sphincter complex; anal rectal mass with mixed high-grade neuroendocrine squamous cell  carcinoma; posterior anal tag with squamous cell carcinoma; left labial lesion-condyloma acuminata ?CTs 03/07/2021-ill-definition of tissue planes along the anus with abnormal soft tissue prominence posteriorly along the anal canal suspicious for mass.  Small adjacent lower perirectal lymph nodes in addition to a 0.5 cm sacral node.  Focal enhancing lesion in the right hepatic lobe 1.2 x 0.8 x 0.9 cm.  2.1 x 1.5 cm cystic lesion right ovary with thin enhancing rim. ?PET scan 03/16/2021-hypermetabolic mass within the anal canal.  No definite evidence of local or distant metastatic disease.  7 mm left submandibular lymph node with mild FDG uptake, likely reactive.  Focal site of FDG uptake within the inner quadrant of the right breast. ?Radiation 03/21/2021-04/27/2021 ?Cycle one 5-FU/mitomycin-C 03/21/2021 ?Cycle two 5-FU/Mitomycin-C 04/18/2021, 5-FU dose reduced secondary to mucositis ?Anoscopy by Dr. Johney Maine 07/20/2021-divet in the right posterior lateral aspect with some right anterior and right lateral folds consistent with radiation treatment.  No ulceration.  Not fully resolved but markedly shrunk down. ?Anorectal mass 09/30/2021 examination under anesthesia, biopsy-poorly differentiated carcinoma-basaloid squamous carcinoma and high-grade neuroendocrine carcinoma, margins positive ?APR/and vertical rectus abdominis flap 11/03/2021, invasive poorly differentiated carcinoma, negative resection margins, 0/10 nodes, treatment response score 2,ypT1,ypN0 ?CT abdomen/pelvis 12/03/2021-high-grade small bowel obstruction transitioning in the left lower abdomen.  Presacral rim-enhancing 4.4 x 2.8 x 3.8 cm fluid collection.  No air in the collection.  Prior study described a potential abnormality in the right lobe of the liver, not seen on current study.  Cystic lesion of the right ovary resolved. ?CT chest 12/07/2021-interval development of bilateral trace pleural effusions. ?  ?Remote history of an anal  fissure repair ?Tobacco  use ?Moderate alcohol use ?Eczema ?Right breast mass- PET scan 03/16/2021 with focal FDG activity in the inner right breast ?Mammogram and right breast ultrasound 03/28/2021- negative ?Bilateral breast MRI 03/31/2021- 7 mm mass in the upper inner right breast correlating with the PET findings, no other abnormality, no adenopathy ?MRI guided biopsy of right breast mass 04/11/2021- fibrocystic change with sclerosing adenosis, small focus of inflammation/fibrosis suggestive of resolving fat necrosis, no evidence of malignancy--repeat bilateral breast MRI at a 47-month interval; bilateral breast MRI on 10/13/2021-no evidence of breast malignancy.  Annual screening mammography recommended. ? ?7.  Admission with small bowel obstruction 12/03/2021 status post exploratory laparotomy and lysis of adhesions ?  ?  ? ?Disposition: Beth [name] appears stable.  She is in clinical remission from anal cancer. ? ?She has persistent discomfort lateral to the midline surgical incision and also has more recent discomfort at the perineal scar.  She will contact Dr. Iran Planas for a follow-up appointment. ? ?She will return for follow-up here in 6 months. ? ?Plan reviewed with Dr. Benay Spice. ? ? ?Ned Card ANP/GNP-BC  ? ?02/27/2022  ?8:20 AM ? ? ? ? ? ? ? ?

## 2022-02-28 DIAGNOSIS — B009 Herpesviral infection, unspecified: Secondary | ICD-10-CM | POA: Insufficient documentation

## 2022-02-28 DIAGNOSIS — E559 Vitamin D deficiency, unspecified: Secondary | ICD-10-CM | POA: Insufficient documentation

## 2022-03-04 DIAGNOSIS — Z933 Colostomy status: Secondary | ICD-10-CM | POA: Diagnosis not present

## 2022-03-04 DIAGNOSIS — C20 Malignant neoplasm of rectum: Secondary | ICD-10-CM | POA: Diagnosis not present

## 2022-03-08 ENCOUNTER — Telehealth: Payer: Self-pay

## 2022-03-08 DIAGNOSIS — C20 Malignant neoplasm of rectum: Secondary | ICD-10-CM | POA: Diagnosis not present

## 2022-03-08 DIAGNOSIS — Z933 Colostomy status: Secondary | ICD-10-CM | POA: Diagnosis not present

## 2022-03-08 NOTE — Telephone Encounter (Signed)
Pt calling for refill of gabapentin 300mg  TID for hot flashes; pt has appt at another facility scheduled; [place], [place] ?

## 2022-03-09 DIAGNOSIS — L280 Lichen simplex chronicus: Secondary | ICD-10-CM | POA: Diagnosis not present

## 2022-03-13 ENCOUNTER — Other Ambulatory Visit: Payer: Self-pay

## 2022-03-13 ENCOUNTER — Other Ambulatory Visit: Payer: Self-pay | Admitting: Licensed Practical Nurse

## 2022-03-13 DIAGNOSIS — Z923 Personal history of irradiation: Secondary | ICD-10-CM | POA: Diagnosis not present

## 2022-03-13 DIAGNOSIS — C211 Malignant neoplasm of anal canal: Secondary | ICD-10-CM | POA: Diagnosis not present

## 2022-03-13 MED ORDER — GABAPENTIN 300 MG PO CAPS: 300.0000 mg | ORAL_CAPSULE | Freq: Three times a day (TID) | ORAL | 3 refills | Status: DC

## 2022-03-13 NOTE — Telephone Encounter (Signed)
Patient is calling to follow up on prescription request. Please advise

## 2022-03-22 DIAGNOSIS — N951 Menopausal and female climacteric states: Secondary | ICD-10-CM | POA: Insufficient documentation

## 2022-03-22 DIAGNOSIS — Z6825 Body mass index (BMI) 25.0-25.9, adult: Secondary | ICD-10-CM | POA: Diagnosis not present

## 2022-03-22 DIAGNOSIS — Z01419 Encounter for gynecological examination (general) (routine) without abnormal findings: Secondary | ICD-10-CM | POA: Diagnosis not present

## 2022-04-04 DIAGNOSIS — Z933 Colostomy status: Secondary | ICD-10-CM | POA: Diagnosis not present

## 2022-04-04 DIAGNOSIS — C20 Malignant neoplasm of rectum: Secondary | ICD-10-CM | POA: Diagnosis not present

## 2022-04-05 DIAGNOSIS — Z933 Colostomy status: Secondary | ICD-10-CM | POA: Diagnosis not present

## 2022-04-05 DIAGNOSIS — C20 Malignant neoplasm of rectum: Secondary | ICD-10-CM | POA: Diagnosis not present

## 2022-04-10 DIAGNOSIS — L280 Lichen simplex chronicus: Secondary | ICD-10-CM | POA: Diagnosis not present

## 2022-05-23 DIAGNOSIS — L280 Lichen simplex chronicus: Secondary | ICD-10-CM | POA: Diagnosis not present

## 2022-06-19 DIAGNOSIS — G5601 Carpal tunnel syndrome, right upper limb: Secondary | ICD-10-CM | POA: Diagnosis not present

## 2022-06-19 DIAGNOSIS — Z6826 Body mass index (BMI) 26.0-26.9, adult: Secondary | ICD-10-CM | POA: Diagnosis not present

## 2022-07-10 ENCOUNTER — Other Ambulatory Visit: Payer: Self-pay

## 2022-07-12 ENCOUNTER — Other Ambulatory Visit: Payer: Self-pay | Admitting: Licensed Practical Nurse

## 2022-07-18 ENCOUNTER — Other Ambulatory Visit: Payer: Self-pay

## 2022-07-28 DIAGNOSIS — Z933 Colostomy status: Secondary | ICD-10-CM | POA: Diagnosis not present

## 2022-07-28 DIAGNOSIS — C20 Malignant neoplasm of rectum: Secondary | ICD-10-CM | POA: Diagnosis not present

## 2022-08-01 ENCOUNTER — Other Ambulatory Visit (HOSPITAL_COMMUNITY): Payer: Self-pay | Admitting: Neurology

## 2022-08-01 ENCOUNTER — Ambulatory Visit (HOSPITAL_COMMUNITY)
Admission: RE | Admit: 2022-08-01 | Discharge: 2022-08-01 | Disposition: A | Discharge status: Home / Self Care | Payer: BC Managed Care – PPO | Source: Ambulatory Visit | Attending: Neurology | Admitting: Neurology

## 2022-08-01 DIAGNOSIS — D519 Vitamin B12 deficiency anemia, unspecified: Secondary | ICD-10-CM | POA: Diagnosis not present

## 2022-08-01 DIAGNOSIS — G5601 Carpal tunnel syndrome, right upper limb: Secondary | ICD-10-CM | POA: Diagnosis not present

## 2022-08-01 DIAGNOSIS — E559 Vitamin D deficiency, unspecified: Secondary | ICD-10-CM | POA: Diagnosis not present

## 2022-08-01 DIAGNOSIS — M13841 Other specified arthritis, right hand: Secondary | ICD-10-CM | POA: Diagnosis not present

## 2022-08-01 DIAGNOSIS — M79641 Pain in right hand: Secondary | ICD-10-CM | POA: Diagnosis not present

## 2022-08-01 DIAGNOSIS — R7303 Prediabetes: Secondary | ICD-10-CM | POA: Diagnosis not present

## 2022-08-01 DIAGNOSIS — M7989 Other specified soft tissue disorders: Secondary | ICD-10-CM | POA: Diagnosis not present

## 2022-08-01 DIAGNOSIS — Z79899 Other long term (current) drug therapy: Secondary | ICD-10-CM | POA: Diagnosis not present

## 2022-08-01 DIAGNOSIS — G5602 Carpal tunnel syndrome, left upper limb: Secondary | ICD-10-CM | POA: Diagnosis not present

## 2022-08-10 ENCOUNTER — Encounter: Payer: BC Managed Care – PPO | Admitting: Nutrition

## 2022-08-10 DIAGNOSIS — G5601 Carpal tunnel syndrome, right upper limb: Secondary | ICD-10-CM | POA: Diagnosis not present

## 2022-08-10 DIAGNOSIS — M5412 Radiculopathy, cervical region: Secondary | ICD-10-CM | POA: Diagnosis not present

## 2022-08-18 DIAGNOSIS — E119 Type 2 diabetes mellitus without complications: Secondary | ICD-10-CM

## 2022-08-18 HISTORY — DX: Type 2 diabetes mellitus without complications: E11.9

## 2022-08-23 ENCOUNTER — Encounter: Payer: Self-pay | Admitting: Dietician

## 2022-08-23 ENCOUNTER — Encounter: Payer: BC Managed Care – PPO | Attending: Family Medicine | Admitting: Dietician

## 2022-08-23 DIAGNOSIS — Z6830 Body mass index (BMI) 30.0-30.9, adult: Secondary | ICD-10-CM | POA: Insufficient documentation

## 2022-08-23 DIAGNOSIS — E669 Obesity, unspecified: Secondary | ICD-10-CM | POA: Insufficient documentation

## 2022-08-23 NOTE — Patient Instructions (Addendum)
Aim to get 150 minutes of physical activity weekly. -consider starting with 15 minutes after dinner. Take your dog with you. -when the weather gets cooler start utilizing your lunch break again.   Try to consume non-starchy vegetables with 2 meals each day.  Make a list of the ones you enjoy: cabbage, cauliflower, cucumbers, green beans, salad greens, tomatoes, broccoli, carrots  Consider packing your lunch again.   Mindfulness:  Consistently scheduled meal - avoid skipping  Choices  Eat slowly  Away from distraction (sitting in kitchen or dining room)  Stop eating when satisfied  Before a snack ask, "Am I hungry or eating for another reason?"   "What can I do instead if I am not hungry?"  Try to find something every day that brings you joy!

## 2022-08-23 NOTE — Progress Notes (Signed)
Medical Nutrition Therapy  Appointment Start time:  [phone]  Appointment End time:  1615  Primary concerns today: Pt states she has a stoma and wants to prevent weight gain. Pt states she wants to learn about how to eat healthier.   Referral diagnosis: E66.9 Preferred learning style: no preference Learning readiness: ready   NUTRITION ASSESSMENT   Anthropometrics  Ht: 68in Wt: 171lb  Clinical Medical Hx: cancer, prediabetes Medications: reviewed Labs: 08/01/22 A1C 6.4%  Notable Signs/Symptoms: none indicated  Lifestyle & Dietary Hx Pt works a sedentary job and states she used to be in the habit of walking on her lunch break and packing lunch but has recently fallen out of this habit but hopes to get back. After work she states she cooks, has dinner, and rides around on her golf cart.   Pt does shopping and cooking for her and her husband. She states they usually eat [place] meals, consisting of mostly meat and starches. She states that when she gets home from work she feels hungry and is not satisfied with her snack. She also reports that after her breakfast of cereal she gets hungry quickly.   Estimated daily fluid intake: 60 oz Supplements: Biotin w/ vitamins Sleep: sleep fluctuates, sleeps soundly or wakes up  Stress / self-care: low stress Current average weekly physical activity: 0 minutes. Pt is considering beginning to walk.   24-Hr Dietary Recall First Meal: sausage and egg biscuit OR raisin bran cereal or rice krispies with whole milk with banana chips Snack: none OR beef jerky OR nabs Second Meal: leftovers OR hamburger with fries OR hotdog and fries Snack: cheese-itz OR pretzels Third Meal: meat and potatoes OR pork chop with broccoli and cheese, creamed potatoes and a roll OR bbq chicken, beans, creamed potatoes, roll. Snack: none Beverages: coffee with cream and splenda, water, occasional soda 2x/wk, daily 3 vodka and crystal light lemonade  NUTRITION DIAGNOSIS   NB-1.1 Food and nutrition-related knowledge deficit As related to balance of protein, carbohydrate and non-starchy vegetables.  As evidenced by pt report and diet history.   NUTRITION INTERVENTION  Nutrition education (E-1) on the following topics:  MyPlate: balance of protein, carbohydrate and non-starchy vegetables Saturated vs. Unsaturated fat Portion sizes Balancing snacks with a protein and carbohydrate Mindfulness strategies Physical activity goals Brainstorming for packing lunch  Handouts Provided Include  Snack sheet MyPlate  Learning Style & Readiness for Change Teaching method utilized: Visual & Auditory  Demonstrated degree of understanding via: Teach Back  Barriers to learning/adherence to lifestyle change: none  Goals Established by Pt Aim to get 150 minutes of physical activity weekly. -consider starting with 15 minutes after dinner. Take your dog with you. -when the weather gets cooler start utilizing your lunch break again.   Try to consume non-starchy vegetables with 2 meals each day.  Make a list of the ones you enjoy: cabbage, cauliflower, cucumbers, green beans, salad greens, tomatoes, broccoli, carrots  Consider packing your lunch again.   Mindfulness:  Consistently scheduled meal - avoid skipping  Choices  Eat slowly  Away from distraction (sitting in kitchen or dining room)  Stop eating when satisfied  Before a snack ask, "Am I hungry or eating for another reason?"   "What can I do instead if I am not hungry?"  Try to find something every day that brings you joy!   MONITORING & EVALUATION Dietary intake, weekly physical activity, and follow up in 1 month.  Next Steps  Patient is to call for  questions.

## 2022-08-25 ENCOUNTER — Ambulatory Visit (HOSPITAL_COMMUNITY)
Admission: RE | Admit: 2022-08-25 | Discharge: 2022-08-25 | Disposition: A | Discharge status: Home / Self Care | Payer: BC Managed Care – PPO | Source: Ambulatory Visit | Attending: Nurse Practitioner | Admitting: Nurse Practitioner

## 2022-08-25 DIAGNOSIS — L309 Dermatitis, unspecified: Secondary | ICD-10-CM | POA: Insufficient documentation

## 2022-08-25 DIAGNOSIS — C210 Malignant neoplasm of anus, unspecified: Secondary | ICD-10-CM | POA: Insufficient documentation

## 2022-08-25 DIAGNOSIS — L24B3 Irritant contact dermatitis related to fecal or urinary stoma or fistula: Secondary | ICD-10-CM | POA: Diagnosis not present

## 2022-08-25 DIAGNOSIS — K5901 Slow transit constipation: Secondary | ICD-10-CM | POA: Diagnosis not present

## 2022-08-25 DIAGNOSIS — K5900 Constipation, unspecified: Secondary | ICD-10-CM | POA: Insufficient documentation

## 2022-08-25 DIAGNOSIS — Z433 Encounter for attention to colostomy: Secondary | ICD-10-CM | POA: Insufficient documentation

## 2022-08-25 DIAGNOSIS — R109 Unspecified abdominal pain: Secondary | ICD-10-CM | POA: Diagnosis not present

## 2022-08-25 DIAGNOSIS — Z933 Colostomy status: Secondary | ICD-10-CM | POA: Diagnosis not present

## 2022-08-25 NOTE — Discharge Instructions (Signed)
Cutting opening larger (more oblong)  See pattern Stop fiber pills Miralax twice daily

## 2022-08-25 NOTE — Progress Notes (Signed)
[facility]   Reason for visit:  LLQ colostomy, noted bleeding at pouch change.  No pain.  Some abdominal discomfort in RLQ  HPI:  Anal cancer with resection, end colostomy ROS  Review of Systems  Gastrointestinal:  Positive for abdominal pain and constipation.       HAs tenderness in RLQ and LLQ at times.  Abdomen is firm, sluggish bowel sounds  Skin:  Positive for rash.       States she gets peristomal irritation at times.   Psychiatric/Behavioral: Negative.    All other systems reviewed and are negative.  Vital signs:  BP (!) 153/86   Pulse 98   Temp 98 F (36.7 C) (Oral)   LMP 03/13/2012  Exam:  Physical Exam Vitals reviewed.  Constitutional:      Appearance: Normal appearance.  Abdominal:     Palpations: Abdomen is soft.  Skin:    Findings: No rash.     Comments: Bleeding from stoma.  Appliance not cut large enough  Neurological:     Mental Status: She is alert and oriented to person, place, and time.  Psychiatric:        Mood and Affect: Mood normal.        Behavior: Behavior normal.     Stoma type/location:  LLQ pink and moist  granuloma at 12 o'clock, not the site of the bleeding.  Stomal assessment/size:  Oval 3 cm x 4 cm pink and moist, os at center Peristomal assessment:  Skin intact  wears coloplast 2 piece pouch, convex with barrier ring, stoma powder and skin prep.  Treatment options for stomal/peristomal skin: powder, skin prep and barrier ring Output: thick brown stool  does not have output every day.  History constipation Ostomy pouching: 2pc. Convex coloplast  Education provided:  Stop fiber pills as she is not drinking a lot of water.  Continue colace daily and increase miralax from daily to twice daily.  Understands we can restart fiber if needed or decrease miralax if too loose   Due to stoma evolving from round to oval, she needs to cut barrier opening wider. I measure and provide her with a new pattern.  Impression/dx   Colostomy Bleeding with pouch change  Contact dermatitis Discussion  See above Plan  Call clinic as needed    Visit time: 40 minutes.   Domenic Moras FNP-BC

## 2022-08-31 ENCOUNTER — Ambulatory Visit: Payer: BC Managed Care – PPO | Admitting: Oncology

## 2022-09-08 ENCOUNTER — Encounter (HOSPITAL_COMMUNITY): Payer: Self-pay | Admitting: *Deleted

## 2022-09-08 ENCOUNTER — Emergency Department (HOSPITAL_COMMUNITY)
Admission: EM | Admit: 2022-09-08 | Discharge: 2022-09-08 | Disposition: A | Discharge status: Home / Self Care | Payer: BC Managed Care – PPO | Attending: Emergency Medicine | Admitting: Emergency Medicine

## 2022-09-08 DIAGNOSIS — E1165 Type 2 diabetes mellitus with hyperglycemia: Secondary | ICD-10-CM | POA: Diagnosis not present

## 2022-09-08 DIAGNOSIS — R5383 Other fatigue: Secondary | ICD-10-CM | POA: Diagnosis not present

## 2022-09-08 DIAGNOSIS — R739 Hyperglycemia, unspecified: Secondary | ICD-10-CM

## 2022-09-08 DIAGNOSIS — Z7984 Long term (current) use of oral hypoglycemic drugs: Secondary | ICD-10-CM | POA: Diagnosis not present

## 2022-09-08 DIAGNOSIS — Z6827 Body mass index (BMI) 27.0-27.9, adult: Secondary | ICD-10-CM | POA: Diagnosis not present

## 2022-09-08 DIAGNOSIS — Z85048 Personal history of other malignant neoplasm of rectum, rectosigmoid junction, and anus: Secondary | ICD-10-CM | POA: Insufficient documentation

## 2022-09-08 LAB — URINALYSIS, ROUTINE W REFLEX MICROSCOPIC
Bacteria, UA: NONE SEEN
Bilirubin Urine: NEGATIVE
Glucose, UA: 150 mg/dL — AB
Ketones, ur: 20 mg/dL — AB
Nitrite: NEGATIVE
Protein, ur: 30 mg/dL — AB
Specific Gravity, Urine: 1.011 (ref 1.005–1.030)
pH: 6 (ref 5.0–8.0)

## 2022-09-08 LAB — BASIC METABOLIC PANEL
Anion gap: 13 (ref 5–15)
BUN: 10 mg/dL (ref 6–20)
CO2: 24 mmol/L (ref 22–32)
Calcium: 9.5 mg/dL (ref 8.9–10.3)
Chloride: 94 mmol/L — ABNORMAL LOW (ref 98–111)
Creatinine, Ser: 0.71 mg/dL (ref 0.44–1.00)
GFR, Estimated: >60 mL/min (ref >60)
Glucose, Bld: 320 mg/dL — ABNORMAL HIGH (ref 70–99)
Potassium: 3.6 mmol/L (ref 3.5–5.1)
Sodium: 131 mmol/L — ABNORMAL LOW (ref 135–145)

## 2022-09-08 LAB — CBG MONITORING, ED
Glucose-Capillary: 330 mg/dL — ABNORMAL HIGH (ref 70–99)
Glucose-Capillary: 247 mg/dL — ABNORMAL HIGH (ref 70–99)
Glucose-Capillary: 222 mg/dL — ABNORMAL HIGH (ref 70–99)

## 2022-09-08 LAB — CBC
HCT: 39.8 % (ref 36.0–46.0)
Hemoglobin: 13.9 g/dL (ref 12.0–15.0)
MCH: 34.3 pg — ABNORMAL HIGH (ref 26.0–34.0)
MCHC: 34.9 g/dL (ref 30.0–36.0)
MCV: 98.3 fL (ref 80.0–100.0)
Platelets: 166 10*3/uL (ref 150–400)
RBC: 4.05 MIL/uL (ref 3.87–5.11)
RDW: 12.5 % (ref 11.5–15.5)
WBC: 4.2 10*3/uL (ref 4.0–10.5)
nRBC: 0 % (ref 0.0–0.2)

## 2022-09-08 MED ORDER — SODIUM CHLORIDE 0.9 % IV BOLUS
1000.0000 mL | Freq: Once | INTRAVENOUS | Status: AC
  Administered 2022-09-08: 1000 mL via INTRAVENOUS

## 2022-09-08 NOTE — Discharge Instructions (Addendum)
You were seen in emergency department today for elevated blood sugar.  Your blood sugar when you initially got here was 320.  Given you some IV fluids and it has come down significantly.  I recommend starting the medication regimen that your doctor prescribed you today.  Continue to monitor how you're doing and return to the ER for new or worsening symptoms.

## 2022-09-08 NOTE — ED Triage Notes (Signed)
Pt in c/o new diagnosis of DM today at [facility] with reported A1C 9.6 and CBg in the 300s, pt denies n/v/d, pt reports feeling "out of it and drinking a lot of water", pt A&O x4

## 2022-09-08 NOTE — ED Notes (Signed)
Dr. Jimmye Norman from [facility] called and stated new onset DM 2  with 9.6 A1C. Hx of anal cancer and partial colectomy. Dr wrote RX for glucometer, lantus and glipizide. Pt just needs to decrease glucose since it read high.

## 2022-09-08 NOTE — ED Provider Notes (Signed)
[facility] EMERGENCY DEPARTMENT Provider Note   CSN: [id number] Arrival date & time: 09/08/22  1257     History  Chief Complaint  Patient presents with   Hyperglycemia    Beth [name] is a 51 y.o. female with history of anal cancer who presents the emergency department for elevated blood sugar.  Patient states that she was having some dry mouth and fatigue, that prompted her to check her blood sugar while at home.  Her daughter is a type I diabetic, and her father is type II diabetic.  Her blood sugar was around 340.  She had contacted her doctor and they got her in for a visit.  They diagnosed her with type 2 diabetes with an A1c of 9.6.  Her PCP wrote her prescriptions for a glucometer, Lantus, glipizide.  They recommended that she come to the ER for assistance in lowering her blood sugar acutely since it was high in their office.  She states that she has been "feeling out of it", as well as drinking lots of water recently.  No nausea or vomiting. No other complaints.    Hyperglycemia Associated symptoms: fatigue and increased thirst   Associated symptoms: no nausea and no vomiting        Home Medications Prior to Admission medications   Medication Sig Start Date End Date Taking? Authorizing Provider  acetaminophen (TYLENOL) 500 MG tablet Take 500 mg by mouth every 6 (six) hours as needed for mild pain.    [provider]  Biotin w/ Vitamins C & E (HAIR SKIN & NAILS GUMMIES PO) Take 2 tablets by mouth daily. Gummies    [provider]  cetirizine (ZYRTEC) 10 MG tablet Take 10 mg by mouth daily.    [provider]  cyclobenzaprine (FLEXERIL) 5 MG tablet Take 5 mg by mouth 3 (three) times daily as needed for muscle spasms. 11/21/21   [provider]  gabapentin (NEURONTIN) 300 MG capsule Take 1 capsule (300 mg total) by mouth 3 (three) times daily for 3 days. 03/13/22 03/16/22  Dominic, Nunzio Cobbs, CNM  ondansetron (ZOFRAN) 8 MG tablet Take 8 mg  by mouth every 8 (eight) hours as needed. 02/13/22   [provider]  ondansetron (ZOFRAN-ODT) 4 MG disintegrating tablet Take 4 mg by mouth every 8 (eight) hours as needed for nausea/vomiting. 11/08/21   [provider]  oxyCODONE (OXY IR/ROXICODONE) 5 MG immediate release tablet Take 1-2 tablets (5-10 mg total) by mouth every 6 (six) hours as needed for moderate pain, severe pain or breakthrough pain. 12/07/21   Michael Boston, MD  spironolactone (ALDACTONE) 100 MG tablet Take 100 mg by mouth daily.    [provider]  TRIAMCINOLONE ACETONIDE, TOP, 0.05 % OINT Apply 1 application topically 4 (four) times daily as needed (eczema). 03/30/21   [provider]      Allergies    Clindamycin/lincomycin and Sulfa drugs cross reactors    Review of Systems   Review of Systems  Constitutional:  Positive for fatigue.  Gastrointestinal:  Negative for nausea and vomiting.  Endocrine: Positive for polydipsia.  Neurological:  Positive for light-headedness.  All other systems reviewed and are negative.   Physical Exam Updated Vital Signs BP (!) 162/106   Pulse 88   Temp 97.9 F (36.6 C) (Oral)   Resp 12   Ht 5\' 8"  (1.727 m)   Wt 77.1 kg   LMP 03/13/2012   SpO2 98%   BMI 25.85 kg/m  Physical Exam  Vitals and nursing note reviewed.  Constitutional:      Appearance: Normal appearance.  HENT:     Head: Normocephalic and atraumatic.  Eyes:     Conjunctiva/sclera: Conjunctivae normal.  Pulmonary:     Effort: Pulmonary effort is normal. No respiratory distress.  Skin:    General: Skin is warm and dry.  Neurological:     Mental Status: She is alert.  Psychiatric:        Mood and Affect: Mood normal.        Behavior: Behavior normal.     ED Results / Procedures / Treatments   Labs (all labs ordered are listed, but only abnormal results are displayed) Labs Reviewed  BASIC METABOLIC PANEL - Abnormal; Notable for the following components:      Result  Value   Sodium 131 (*)    Chloride 94 (*)    Glucose, Bld 320 (*)    All other components within normal limits  CBC - Abnormal; Notable for the following components:   MCH 34.3 (*)    All other components within normal limits  CBG MONITORING, ED - Abnormal; Notable for the following components:   Glucose-Capillary 330 (*)    All other components within normal limits  CBG MONITORING, ED - Abnormal; Notable for the following components:   Glucose-Capillary 247 (*)    All other components within normal limits  URINALYSIS, ROUTINE W REFLEX MICROSCOPIC  CBG MONITORING, ED    EKG None  Radiology No results found.  Procedures Procedures    Medications Ordered in ED Medications  sodium chloride 0.9 % bolus 1,000 mL (1,000 mLs Intravenous New Bag/Given 09/08/22 1805)    ED Course/ Medical Decision Making/ A&P                           Medical Decision Making Amount and/or Complexity of Data Reviewed Labs: ordered.   Patient is a 51 year old female presents the emergency department with complaint of elevated blood sugar.  Was diagnosed with type 2 diabetes today at her PCP.  Was recommended she come to the ER for assistance with lowering her blood sugar until she can start her prescribed medications.  On exam patient is hypertensive, otherwise has normal vital signs.  In no acute distress.  Is only complaining of feeling thirsty.  CBC unremarkable.  Glucose 320, normal kidney function.  Repeated sugar before intervention 247. Not in DKA.   IV fluid bolus given.  After consideration of the diagnostic results and the patients response to treatment, I feel that emergency department workup does not suggest an emergent condition requiring admission or immediate intervention beyond what has been performed at this time. The plan is: discharge to home with encouraged PO fluid intake and starting diabetes medication regimen as prescribed by PCP. The patient is safe for discharge and has  been instructed to return immediately for worsening symptoms, change in symptoms or any other concerns.  Final Clinical Impression(s) / ED Diagnoses Final diagnoses:  Hyperglycemia    Rx / DC Orders ED Discharge Orders     None      Portions of this report may have been transcribed using voice recognition software. Every effort was made to ensure accuracy; however, inadvertent computerized transcription errors may be present.    Estill Cotta 09/08/22 Diamantina Monks, MD 09/09/22 1217

## 2022-09-08 NOTE — ED Notes (Signed)
C/o HA, and light headedness. Denies other pain, NVD, fever, sob or other sx.

## 2022-09-14 ENCOUNTER — Inpatient Hospital Stay: Payer: BC Managed Care – PPO | Attending: Oncology | Admitting: Oncology

## 2022-09-14 VITALS — BP 127/88 | HR 93 | Temp 98.2°F | Resp 18 | Ht 68.0 in | Wt 171.0 lb

## 2022-09-14 DIAGNOSIS — Z720 Tobacco use: Secondary | ICD-10-CM | POA: Insufficient documentation

## 2022-09-14 DIAGNOSIS — F1090 Alcohol use, unspecified, uncomplicated: Secondary | ICD-10-CM | POA: Diagnosis not present

## 2022-09-14 DIAGNOSIS — L309 Dermatitis, unspecified: Secondary | ICD-10-CM | POA: Insufficient documentation

## 2022-09-14 DIAGNOSIS — C210 Malignant neoplasm of anus, unspecified: Secondary | ICD-10-CM | POA: Diagnosis not present

## 2022-09-14 DIAGNOSIS — Z85048 Personal history of other malignant neoplasm of rectum, rectosigmoid junction, and anus: Secondary | ICD-10-CM | POA: Insufficient documentation

## 2022-09-14 NOTE — Progress Notes (Signed)
[facility] OFFICE PROGRESS NOTE   Diagnosis: Anal cancer   INTERVAL HISTORY:   Beth [name] returns as scheduled.  She was recently diagnosed with diabetes.  Her daughter and father also have diabetes.  She otherwise feels well.  The colostomy is functioning well.  Good appetite.  She has "soreness "at the right abdominal scar.  Objective:  Vital signs in last 24 hours:  Blood pressure 127/88, pulse 93, temperature 98.2 F (36.8 C), temperature source Oral, resp. rate 18, height 5\' 8"  (1.727 m), weight 171 lb (77.6 kg), last menstrual period 03/13/2012, SpO2 98 %.    HEENT: Neck without mass Lymphatics: No cervical, supraclavicular, axillary, or inguinal nodes Resp: Lungs clear bilaterally Cardio: Regular rate and rhythm GI: No hepatosplenomegaly, no mass, left lower quadrant colostomy Vascular: No leg edema  Skin: Perineal scar without evidence of recurrent tumor  Portacath/PICC-without erythema  Lab Results:  Lab Results  Component Value Date   WBC 4.2 09/08/2022   HGB 13.9 09/08/2022   HCT 39.8 09/08/2022   MCV 98.3 09/08/2022   PLT 166 09/08/2022   NEUTROABS 10.9 (H) 12/03/2021    CMP  Lab Results  Component Value Date   NA 131 (L) 09/08/2022   K 3.6 09/08/2022   CL 94 (L) 09/08/2022   CO2 24 09/08/2022   GLUCOSE 320 (H) 09/08/2022   BUN 10 09/08/2022   CREATININE 0.71 09/08/2022   CALCIUM 9.5 09/08/2022   PROT 8.3 (H) 12/03/2021   ALBUMIN 4.3 12/03/2021   AST 62 (H) 12/03/2021   ALT 62 (H) 12/03/2021   ALKPHOS 91 12/03/2021   BILITOT 0.8 12/03/2021   GFRNONAA >60 09/08/2022   GFRAA 106 11/29/2020    Medications: I have reviewed the patient's current medications.   Assessment/Plan: Squamous cell carcinoma of the anal canal Colonoscopy 01/28/2021-rectal mass extending to the "outside ", palpable on exam-biopsy revealed high-grade squamous intraepithelial lesion (AIN 3/carcinoma in situ) Exam under anesthesia with transanal excision  of an anal canal mass 02/25/2021-frozen section consistent with squamous cell carcinoma, 4 x 4 centimeter sessile anal canal mass extending to the anal verge, right lateral and posterior, 30% circumferential, fixed to the anal sphincter complex; anal rectal mass with mixed high-grade neuroendocrine squamous cell carcinoma; posterior anal tag with squamous cell carcinoma; left labial lesion-condyloma acuminata CTs 03/07/2021-ill-definition of tissue planes along the anus with abnormal soft tissue prominence posteriorly along the anal canal suspicious for mass.  Small adjacent lower perirectal lymph nodes in addition to a 0.5 cm sacral node.  Focal enhancing lesion in the right hepatic lobe 1.2 x 0.8 x 0.9 cm.  2.1 x 1.5 cm cystic lesion right ovary with thin enhancing rim. PET scan 03/16/2021-hypermetabolic mass within the anal canal.  No definite evidence of local or distant metastatic disease.  7 mm left submandibular lymph node with mild FDG uptake, likely reactive.  Focal site of FDG uptake within the inner quadrant of the right breast. Radiation 03/21/2021-04/27/2021 Cycle one 5-FU/mitomycin-C 03/21/2021 Cycle two 5-FU/Mitomycin-C 04/18/2021, 5-FU dose reduced secondary to mucositis Anoscopy by Dr. Johney Maine 07/20/2021-divet in the right posterior lateral aspect with some right anterior and right lateral folds consistent with radiation treatment.  No ulceration.  Not fully resolved but markedly shrunk down. Anorectal mass 09/30/2021 examination under anesthesia, biopsy-poorly differentiated carcinoma-basaloid squamous carcinoma and high-grade neuroendocrine carcinoma, margins positive APR/and vertical rectus abdominis flap 11/03/2021, invasive poorly differentiated carcinoma, negative resection margins, 0/10 nodes, treatment response score 2,ypT1,ypN0 CT abdomen/pelvis 12/03/2021-high-grade small bowel obstruction transitioning in  the left lower abdomen.  Presacral rim-enhancing 4.4 x 2.8 x 3.8 cm fluid collection.  No  air in the collection.  Prior study described a potential abnormality in the right lobe of the liver, not seen on current study.  Cystic lesion of the right ovary resolved. CT chest 12/07/2021-interval development of bilateral trace pleural effusions.   Remote history of an anal fissure repair Tobacco use Moderate alcohol use Eczema Right breast mass- PET scan 03/16/2021 with focal FDG activity in the inner right breast Mammogram and right breast ultrasound 03/28/2021- negative Bilateral breast MRI 03/31/2021- 7 mm mass in the upper inner right breast correlating with the PET findings, no other abnormality, no adenopathy MRI guided biopsy of right breast mass 04/11/2021- fibrocystic change with sclerosing adenosis, small focus of inflammation/fibrosis suggestive of resolving fat necrosis, no evidence of malignancy--repeat bilateral breast MRI at a 19-month interval; bilateral breast MRI on 10/13/2021-no evidence of breast malignancy.  Annual screening mammography recommended.  7.  Admission with small bowel obstruction 12/03/2021 status post exploratory laparotomy and lysis of adhesions       Disposition: [name] is in clinical remission from anal cancer.  She will be scheduled for surveillance CTs in December.  She will return for an office visit in 6 months.  [name], MD  09/14/2022  8:36 AM

## 2022-09-19 DIAGNOSIS — Z6828 Body mass index (BMI) 28.0-28.9, adult: Secondary | ICD-10-CM | POA: Diagnosis not present

## 2022-09-19 DIAGNOSIS — Z85038 Personal history of other malignant neoplasm of large intestine: Secondary | ICD-10-CM | POA: Diagnosis not present

## 2022-09-19 DIAGNOSIS — E1165 Type 2 diabetes mellitus with hyperglycemia: Secondary | ICD-10-CM | POA: Diagnosis not present

## 2022-09-20 DIAGNOSIS — Z933 Colostomy status: Secondary | ICD-10-CM | POA: Diagnosis not present

## 2022-09-20 DIAGNOSIS — C20 Malignant neoplasm of rectum: Secondary | ICD-10-CM | POA: Diagnosis not present

## 2022-09-25 ENCOUNTER — Encounter: Payer: Self-pay | Admitting: Dietician

## 2022-09-25 ENCOUNTER — Encounter: Payer: BC Managed Care – PPO | Attending: Family Medicine | Admitting: Dietician

## 2022-09-25 DIAGNOSIS — E669 Obesity, unspecified: Secondary | ICD-10-CM | POA: Diagnosis not present

## 2022-09-25 NOTE — Progress Notes (Signed)
Medical Nutrition Therapy  Appointment Start time:  93  Appointment End time:  1600  Primary concerns today: Since last visit pt was diagnosed with diabetes.    Referral diagnosis: E66.9 Preferred learning style: no preference indicated Learning readiness: ready   NUTRITION ASSESSMENT   Anthropometrics  Ht: 68in Wt: 171lbs Wt 08/23/22: 171lbs  Clinical Medical Hx: cancer, prediabetes Medications: reviewed, newly taking glipizide, Semglee long acting insulin Labs: 08/01/22 A1C 6.4%, 09/08/22 A1C 9.6% Notable Signs/Symptoms: none indicated  Lifestyle & Dietary Hx  Per last visit, pt states she has been walking some but not as much as she hoped. She walks around the yard for 15 minutes in the evening.   Pt states since her diagnosis of diabetes she has been trying to cut out sweets.   Pt noticed she was very thirsty so she checked her BG at her moms house and it was 300+, she then went to the ER and got her diabetes diagnosis.  Pt states her doctor told her to not eat any carbohydrates.   Pt is concerned because she feels she doesn't know what to eat.   Estimated daily fluid intake: 60 oz Supplements: biotin with vitamins Sleep: not assessed this visit Stress / self-care: moderate stress Current average weekly physical activity: 15 minutes walking  24-Hr Dietary Recall First Meal: eggs with bacon or ham OR cheerios and strawberries OR whole grain english muffin with butter and SF jelly Snack: SF wafer cookies OR apple Second Meal: salad with grilled chicken OR chicken salad with crackers and pineapple OR low carb tortilla with chicken and cheese Snack: SF chocolate OR fruit Third Meal: chicken with broccoli with cheese OR pork chops with broccoli and cheese and beans Snack: none Beverages: water, SF sunkist   NUTRITION DIAGNOSIS  NB-1.1 Food and nutrition-related knowledge deficit As related to balance of protein, carbohydrate and non-starchy vegetables.  As evidenced  by pt report and diet hx.   NUTRITION INTERVENTION  Nutrition education (E-1) on the following topics:  MyPlate for diabetes Impact of fiber on blood glucose levels Balance of carbohydrate, protein, and non-starchy vegetables Building balanced snacks Insulin resistance Importance of physical activity for blood glucose control  Handouts Provided Include  MyPlacemat for Diabetes ADA: How to Thrive: A Guide for Your Journey with Diabetes Low-Carb Snack Sheet  Learning Style & Readiness for Change Teaching method utilized: Visual & Auditory  Demonstrated degree of understanding via: Teach Back  Barriers to learning/adherence to lifestyle change: none  Previous Goals Established by Pt Aim to get 150 minutes of physical activity weekly.   Try to consume non-starchy vegetables with 2 meals each day.    Consider packing your lunch again.    Mindfulness           New Goals: Aim to increase your water intake. Tips for increasing water intake: -Keep a glass of water by your bedside so that you're encouraged to drink it upon waking -Carry a re-usable, re-fillable water bottle -Add fruit such as lemon, lime, berries, or cucumbers to your water -Try sparkling water -Make herbal tea and drink it hot or iced  Make 1/2 of your plate non-starchy vegetables as often as possible.  When snacking, if choosing a carbohydrate be sure to pair it with a protein.  Aim for 150 minutes of physical activity weekly.   MONITORING & EVALUATION Dietary intake, weekly physical activity, and follow up in 1 month.  Next Steps  Patient is to call for questions.

## 2022-09-25 NOTE — Patient Instructions (Addendum)
Aim to increase your water intake. Tips for increasing water intake: -Keep a glass of water by your bedside so that you're encouraged to drink it upon waking -Carry a re-usable, re-fillable water bottle -Add fruit such as lemon, lime, berries, or cucumbers to your water -Try sparkling water -Make herbal tea and drink it hot or iced  Make 1/2 of your plate non-starchy vegetables as often as possible.  When snacking, if choosing a carbohydrate be sure to pair it with a protein.  Aim for 150 minutes of physical activity weekly.

## 2022-10-20 DIAGNOSIS — Z6828 Body mass index (BMI) 28.0-28.9, adult: Secondary | ICD-10-CM | POA: Diagnosis not present

## 2022-10-20 DIAGNOSIS — E1165 Type 2 diabetes mellitus with hyperglycemia: Secondary | ICD-10-CM | POA: Diagnosis not present

## 2022-10-20 DIAGNOSIS — Z85038 Personal history of other malignant neoplasm of large intestine: Secondary | ICD-10-CM | POA: Diagnosis not present

## 2022-10-26 ENCOUNTER — Ambulatory Visit: Payer: BC Managed Care – PPO | Admitting: Dietician

## 2022-11-01 ENCOUNTER — Encounter (HOSPITAL_COMMUNITY): Payer: Self-pay | Admitting: Nurse Practitioner

## 2022-11-01 DIAGNOSIS — L0100 Impetigo, unspecified: Secondary | ICD-10-CM | POA: Diagnosis not present

## 2022-11-01 DIAGNOSIS — L719 Rosacea, unspecified: Secondary | ICD-10-CM | POA: Diagnosis not present

## 2022-11-03 ENCOUNTER — Ambulatory Visit (HOSPITAL_COMMUNITY)
Admission: RE | Admit: 2022-11-03 | Discharge: 2022-11-03 | Disposition: A | Discharge status: Home / Self Care | Payer: BC Managed Care – PPO | Source: Ambulatory Visit | Attending: Nurse Practitioner | Admitting: Nurse Practitioner

## 2022-11-03 DIAGNOSIS — L24B3 Irritant contact dermatitis related to fecal or urinary stoma or fistula: Secondary | ICD-10-CM | POA: Insufficient documentation

## 2022-11-03 DIAGNOSIS — C210 Malignant neoplasm of anus, unspecified: Secondary | ICD-10-CM | POA: Diagnosis not present

## 2022-11-03 DIAGNOSIS — Z933 Colostomy status: Secondary | ICD-10-CM | POA: Diagnosis not present

## 2022-11-03 DIAGNOSIS — K9400 Colostomy complication, unspecified: Secondary | ICD-10-CM

## 2022-11-03 NOTE — Progress Notes (Signed)
[facility]   Reason for visit:  LLQ colostomy HPI:  Anal cancer with resection end colostomy ROS  Review of Systems  Gastrointestinal:        LLQ colostomy   Skin:  Positive for rash.       Peristomal breakdown   All other systems reviewed and are negative.  Vital signs:  BP (!) 130/91   Pulse 92   Temp 98 F (36.7 C) (Oral)   Resp 18   LMP 03/13/2012   SpO2 95%  Exam:  Physical Exam Vitals reviewed.  Constitutional:      Appearance: Normal appearance.  Abdominal:     Palpations: Abdomen is soft.  Skin:    Findings: Rash present.  Neurological:     Mental Status: She is alert and oriented to person, place, and time.  Psychiatric:        Mood and Affect: Mood normal.        Behavior: Behavior normal.     Stoma type/location:  LLQ pink and moist  Stomal assessment/size:  oval stoma, pink and moist Peristomal assessment:  partial thickness tissue loss Treatment options for stomal/peristomal skin: barrier ring 2 piece coloplast pouch soft brown stool in pouch Output: soft stool Ostomy pouching: 2pc. Pouch with barrier ring Education provided:  call clinic as needed.     Impression/dx  Contact dermatitis Oval stoma Discussion  See above   Plan  Call clinic for appointment as needed    Visit time: 30 minutes.   Domenic Moras FNP-BC

## 2022-11-07 DIAGNOSIS — K9400 Colostomy complication, unspecified: Secondary | ICD-10-CM | POA: Insufficient documentation

## 2022-11-07 NOTE — Discharge Instructions (Signed)
Call clinic as needed

## 2022-11-18 DIAGNOSIS — Z933 Colostomy status: Secondary | ICD-10-CM | POA: Diagnosis not present

## 2022-11-18 DIAGNOSIS — C20 Malignant neoplasm of rectum: Secondary | ICD-10-CM | POA: Diagnosis not present

## 2022-11-22 DIAGNOSIS — L309 Dermatitis, unspecified: Secondary | ICD-10-CM | POA: Diagnosis not present

## 2022-11-27 ENCOUNTER — Telehealth: Payer: Self-pay | Admitting: *Deleted

## 2022-11-27 NOTE — Telephone Encounter (Signed)
Beth [name] called to report as of 11/17/2022 she has a new [employer] option plan. ID #[id number] Group #[id number] Asking to be sure her CT scan for 12/04/22 is authorized under this plan. Forwarded her request to managed care and informed she will be notified by Friday if it was approved.

## 2022-11-28 NOTE — Telephone Encounter (Signed)
Notified patient that her CT scan has been approved w/new policy.

## 2022-12-04 ENCOUNTER — Encounter (HOSPITAL_BASED_OUTPATIENT_CLINIC_OR_DEPARTMENT_OTHER): Payer: Self-pay

## 2022-12-04 ENCOUNTER — Inpatient Hospital Stay: Payer: BC Managed Care – PPO | Attending: Oncology

## 2022-12-04 ENCOUNTER — Ambulatory Visit (HOSPITAL_BASED_OUTPATIENT_CLINIC_OR_DEPARTMENT_OTHER)
Admission: RE | Admit: 2022-12-04 | Discharge: 2022-12-04 | Disposition: A | Discharge status: Home / Self Care | Payer: BC Managed Care – PPO | Source: Ambulatory Visit | Attending: Oncology | Admitting: Oncology

## 2022-12-04 DIAGNOSIS — Z85048 Personal history of other malignant neoplasm of rectum, rectosigmoid junction, and anus: Secondary | ICD-10-CM | POA: Insufficient documentation

## 2022-12-04 DIAGNOSIS — C210 Malignant neoplasm of anus, unspecified: Secondary | ICD-10-CM | POA: Insufficient documentation

## 2022-12-04 LAB — BASIC METABOLIC PANEL - CANCER CENTER ONLY
Anion gap: 9 (ref 5–15)
BUN: 11 mg/dL (ref 6–20)
CO2: 28 mmol/L (ref 22–32)
Calcium: 10 mg/dL (ref 8.9–10.3)
Chloride: 104 mmol/L (ref 98–111)
Creatinine: 0.54 mg/dL (ref 0.44–1.00)
GFR, Estimated: >60 mL/min (ref >60)
Glucose, Bld: 131 mg/dL — ABNORMAL HIGH (ref 70–99)
Potassium: 4 mmol/L (ref 3.5–5.1)
Sodium: 141 mmol/L (ref 135–145)

## 2022-12-04 MED ORDER — IOHEXOL 300 MG/ML  SOLN
100.0000 mL | Freq: Once | INTRAMUSCULAR | Status: AC | PRN
  Administered 2022-12-04: 85 mL via INTRAVENOUS

## 2022-12-05 ENCOUNTER — Telehealth: Payer: Self-pay | Admitting: Nurse Practitioner

## 2022-12-05 ENCOUNTER — Other Ambulatory Visit: Payer: BC Managed Care – PPO

## 2022-12-05 ENCOUNTER — Other Ambulatory Visit: Payer: Self-pay | Admitting: Nurse Practitioner

## 2022-12-05 DIAGNOSIS — C210 Malignant neoplasm of anus, unspecified: Secondary | ICD-10-CM

## 2022-12-05 NOTE — Telephone Encounter (Signed)
I called Beth [name] regarding the CT scan results from yesterday.  There is a new, solitary nodule in the right lower lobe concerning for metastatic disease.  She understands we are making a referral to Dr. Roxan Hockey and she will be contacted with an appointment.

## 2022-12-06 ENCOUNTER — Encounter: Payer: Self-pay | Admitting: *Deleted

## 2022-12-06 NOTE — Progress Notes (Signed)
Faxed referral order, demographics, office note, CT report and med list to Dr. Leonarda Salon office for "urgent" referral for new solitary lung nodule. H/O anal cancer. Fax [phone]

## 2022-12-13 ENCOUNTER — Telehealth: Payer: Self-pay

## 2022-12-13 ENCOUNTER — Other Ambulatory Visit: Payer: Self-pay | Admitting: *Deleted

## 2022-12-13 DIAGNOSIS — R911 Solitary pulmonary nodule: Secondary | ICD-10-CM

## 2022-12-13 DIAGNOSIS — C210 Malignant neoplasm of anus, unspecified: Secondary | ICD-10-CM

## 2022-12-13 NOTE — Telephone Encounter (Signed)
Mrs.Reisz called and ask if our office working on her PET scan for Dr. Roxan Hockey. I advise the patient to have Dr. Leonarda Salon staff to give [place] a call so we can  placed the order.

## 2022-12-13 NOTE — Progress Notes (Signed)
Received return call from [facility] (Dr. Leonarda Salon office) reporting that partner, Dr. Kipp Brood does request she have PET scan and PFTs w/[facility] be scheduled prior to her visit if possible. Orders placed and managed care notified to work on PA asap.

## 2022-12-14 ENCOUNTER — Other Ambulatory Visit: Payer: Self-pay

## 2022-12-14 ENCOUNTER — Telehealth: Payer: Self-pay | Admitting: *Deleted

## 2022-12-14 NOTE — Telephone Encounter (Signed)
Called patient w/appointment at [facility] on 12/19/22 at 0900 w/arrival time of 0845. OK to eat/drink--avoid caffeine that morning. She requested the information be put in MyChart for her since she was driving when nurse called her.

## 2022-12-14 NOTE — Progress Notes (Signed)
The proposed treatment discussed in conference is for discussion purpose only and is not a binding recommendation.  The patients have not been physically examined, or presented with their treatment options.  Therefore, final treatment plans cannot be decided.  

## 2022-12-15 ENCOUNTER — Ambulatory Visit (HOSPITAL_COMMUNITY)
Admission: RE | Admit: 2022-12-15 | Discharge: 2022-12-15 | Disposition: A | Discharge status: Home / Self Care | Payer: BC Managed Care – PPO | Source: Ambulatory Visit | Attending: Nurse Practitioner | Admitting: Nurse Practitioner

## 2022-12-15 DIAGNOSIS — K9409 Other complications of colostomy: Secondary | ICD-10-CM | POA: Diagnosis present

## 2022-12-15 DIAGNOSIS — Y832 Surgical operation with anastomosis, bypass or graft as the cause of abnormal reaction of the patient, or of later complication, without mention of misadventure at the time of the procedure: Secondary | ICD-10-CM | POA: Diagnosis not present

## 2022-12-15 DIAGNOSIS — L309 Dermatitis, unspecified: Secondary | ICD-10-CM

## 2022-12-15 DIAGNOSIS — K9400 Colostomy complication, unspecified: Secondary | ICD-10-CM

## 2022-12-15 DIAGNOSIS — L24B3 Irritant contact dermatitis related to fecal or urinary stoma or fistula: Secondary | ICD-10-CM | POA: Diagnosis not present

## 2022-12-15 NOTE — Progress Notes (Signed)
[facility]   Reason for visit:  Pain around stoma site. Is using deep convexity that I feel is causing tenderness.   Prefers 2 piece pouch HPI:   Past Medical History:  Diagnosis Date  . Acne    on face spirolactone for  . Anal cancer (HCC) 02/25/2021  . Constipation, chronic 08/08/2016  . History of migraine    none in years  . Wears glasses    Family History  Problem Relation Age of Onset  . Breast cancer Paternal Aunt    Allergies  Allergen Reactions  . Clindamycin/Lincomycin Rash  . Sulfa Drugs Cross Reactors Itching and Rash   Current Outpatient Medications  Medication Sig Dispense Refill Last Dose  . acetaminophen (TYLENOL) 500 MG tablet Take 500 mg by mouth every 6 (six) hours as needed for mild pain.     . Biotin w/ Vitamins C & E (HAIR SKIN & NAILS GUMMIES PO) Take 2 tablets by mouth daily. Gummies     . cetirizine (ZYRTEC) 10 MG tablet Take 10 mg by mouth daily.     . cyclobenzaprine (FLEXERIL) 5 MG tablet Take 5 mg by mouth 3 (three) times daily as needed for muscle spasms.     Marland Kitchen gabapentin (NEURONTIN) 600 MG tablet Take 600 mg by mouth every 8 (eight) hours.     Marland Kitchen glipiZIDE (GLUCOTROL XL) 2.5 MG 24 hr tablet Take 2.5 mg by mouth every morning.     . hydrOXYzine (ATARAX) 25 MG tablet Take 25 mg by mouth at bedtime.     . ondansetron (ZOFRAN) 8 MG tablet Take 8 mg by mouth every 8 (eight) hours as needed. (Patient not taking: Reported on 09/14/2022)     . ondansetron (ZOFRAN-ODT) 4 MG disintegrating tablet Take 4 mg by mouth every 8 (eight) hours as needed for nausea/vomiting. (Patient not taking: Reported on 09/14/2022)     . oxyCODONE (OXY IR/ROXICODONE) 5 MG immediate release tablet Take 1-2 tablets (5-10 mg total) by mouth every 6 (six) hours as needed for moderate pain, severe pain or breakthrough pain. (Patient not taking: Reported on 09/14/2022) 30 tablet 0   . SEMGLEE, YFGN, 100 UNIT/ML Pen Inject into the skin.     Marland Kitchen spironolactone (ALDACTONE) 100  MG tablet Take 100 mg by mouth daily.     . TRIAMCINOLONE ACETONIDE, TOP, 0.05 % OINT Apply 1 application topically 4 (four) times daily as needed (eczema).     . valACYclovir (VALTREX) 500 MG tablet Take 500 mg by mouth daily.      No current facility-administered medications for this encounter.   ROS  Review of Systems  Gastrointestinal:        LLQ colostomy  Skin:        Redness, tenderness to peristomal skin  All other systems reviewed and are negative.  Vital signs:  BP (!) 150/101 (BP Location: Right Arm)   Pulse 95   Temp 98 F (36.7 C) (Oral)   Resp 18   LMP 03/13/2012   SpO2 99%  Exam:  Physical Exam Vitals reviewed.  Constitutional:      Appearance: Normal appearance. She is obese.  Abdominal:     Palpations: Abdomen is soft.  Skin:    General: Skin is warm and dry.     Findings: Bruising present.  Neurological:     Mental Status: She is alert and oriented to person, place, and time.  Psychiatric:        Mood and Affect: Mood normal.  Behavior: Behavior normal.    Stoma type/location:  LLQ colostomy Stomal assessment/size:  oval stoma Peristomal assessment:  bruising at 3 and 9 o'clock Treatment options for stomal/peristomal skin: barrier ring, 2 piece flat pouch with ring to build minimal convexity Output: soft brown stool Ostomy pouching: 2pc. Soft convex with barrier ring  coloplast Education provided:  I apply new pouch  Instruct her to let me know if this is the pouch she prefers.  She can wear a presized 1 3/8" flat with barrier ring    Impression/dx  Colostomy complication Contact dermatitis to peristomal skin Discussion  Will update [facility] if she wishes to change.  Plan  See back in 2 weeks as needed    Visit time: 35 minutes.   Maple Hudson FNP-BC

## 2022-12-15 NOTE — Discharge Instructions (Addendum)
We are switching to presized 1 3/8" flat coloplast 2 piece pouch Continue stoma powder skin prep and barrier ring I will update with [facility] if you want to switch [id number] # barrier #pouch [id number]

## 2022-12-19 ENCOUNTER — Ambulatory Visit (HOSPITAL_COMMUNITY)
Admission: RE | Admit: 2022-12-19 | Discharge: 2022-12-19 | Disposition: A | Discharge status: Home / Self Care | Payer: BC Managed Care – PPO | Source: Ambulatory Visit | Attending: Oncology | Admitting: Oncology

## 2022-12-19 DIAGNOSIS — C210 Malignant neoplasm of anus, unspecified: Secondary | ICD-10-CM | POA: Diagnosis present

## 2022-12-19 DIAGNOSIS — R911 Solitary pulmonary nodule: Secondary | ICD-10-CM | POA: Diagnosis present

## 2022-12-19 LAB — PULMONARY FUNCTION TEST
DL/VA % pred: 84 %
DL/VA: 3.51 ml/min/mmHg/L
DLCO unc % pred: 76 %
DLCO unc: 18.2 ml/min/mmHg
FEF 25-75 Post: 2.63 L/sec
FEF 25-75 Pre: 1.98 L/sec
FEF2575-%Change-Post: 33 %
FEF2575-%Pred-Post: 90 %
FEF2575-%Pred-Pre: 67 %
FEV1-%Change-Post: 7 %
FEV1-%Pred-Post: 90 %
FEV1-%Pred-Pre: 84 %
FEV1-Post: 2.85 L
FEV1-Pre: 2.65 L
FEV1FVC-%Change-Post: 7 %
FEV1FVC-%Pred-Pre: 91 %
FEV6-%Change-Post: 0 %
FEV6-%Pred-Post: 92 %
FEV6-%Pred-Pre: 92 %
FEV6-Post: 3.61 L
FEV6-Pre: 3.6 L
FEV6FVC-%Change-Post: 0 %
FEV6FVC-%Pred-Post: 102 %
FEV6FVC-%Pred-Pre: 102 %
FVC-%Change-Post: 0 %
FVC-%Pred-Post: 90 %
FVC-%Pred-Pre: 90 %
FVC-Post: 3.62 L
FVC-Pre: 3.63 L
Post FEV1/FVC ratio: 79 %
Post FEV6/FVC ratio: 100 %
Pre FEV1/FVC ratio: 73 %
Pre FEV6/FVC Ratio: 99 %
RV % pred: 90 %
RV: 1.84 L
TLC % pred: 98 %
TLC: 5.57 L

## 2022-12-19 MED ORDER — ALBUTEROL SULFATE (2.5 MG/3ML) 0.083% IN NEBU
2.5000 mg | INHALATION_SOLUTION | Freq: Once | RESPIRATORY_TRACT | Status: AC
  Administered 2022-12-19: 2.5 mg via RESPIRATORY_TRACT

## 2022-12-21 ENCOUNTER — Encounter (HOSPITAL_COMMUNITY): Payer: BC Managed Care – PPO

## 2022-12-21 ENCOUNTER — Encounter (HOSPITAL_COMMUNITY)
Admission: RE | Admit: 2022-12-21 | Discharge: 2022-12-21 | Disposition: A | Discharge status: Home / Self Care | Payer: BC Managed Care – PPO | Source: Ambulatory Visit | Attending: Oncology | Admitting: Oncology

## 2022-12-21 ENCOUNTER — Institutional Professional Consult (permissible substitution): Payer: BC Managed Care – PPO | Admitting: Thoracic Surgery (Cardiothoracic Vascular Surgery)

## 2022-12-21 VITALS — BP 150/99 | HR 90 | Resp 20 | Ht 68.0 in | Wt 176.0 lb

## 2022-12-21 DIAGNOSIS — C210 Malignant neoplasm of anus, unspecified: Secondary | ICD-10-CM | POA: Insufficient documentation

## 2022-12-21 DIAGNOSIS — R911 Solitary pulmonary nodule: Secondary | ICD-10-CM | POA: Diagnosis not present

## 2022-12-21 LAB — GLUCOSE, CAPILLARY: Glucose-Capillary: 119 mg/dL — ABNORMAL HIGH (ref 70–99)

## 2022-12-21 MED ORDER — FLUDEOXYGLUCOSE F - 18 (FDG) INJECTION
8.5000 | Freq: Once | INTRAVENOUS | Status: AC
  Administered 2022-12-21: 8.34 via INTRAVENOUS

## 2022-12-21 NOTE — H&P (View-Only) (Signed)
PCP is Caryl Bis, MD Referring Provider is Owens Shark, NP  Chief Complaint  Patient presents with   Lung Lesion    Surgical consult/ Chest CT 12/04/22/ PET scan  12/21/22/ PFT's 12/19/22    HPI: Beth [name] is sent for consultation regarding a right lower lobe lung mass.  Beth [name] is a 52 year old woman with a history of tobacco abuse, anal cancer, and migraines.  She was diagnosed with anal cancer in 2021.  She was treated with chemo and radiation.  She ultimately ended up having a AP resection with end colostomy and a rectus flap to the perineum.  Recently she had a routine follow-up CT which showed a new 2.5 x 2.6 cm mass in the right lower lobe.  She does have a history of tobacco use.  She smoked about a pack a day for 25 years prior to quitting 2 years ago.  She denies any chest pain, pressure, tightness, shortness of breath, cough, wheezing, or hemoptysis.  She is gained some weight recently.  No problems with appetite.  No unusual headaches or visual changes.  Zubrod Score: At the time of surgery this patient's most appropriate activity status/level should be described as: [x]     0    Normal activity, no symptoms []     1    Restricted in physical strenuous activity but ambulatory, able to do out light work []     2    Ambulatory and capable of self care, unable to do work activities, up and about >50 % of waking hours                              []     3    Only limited self care, in bed greater than 50% of waking hours []     4    Completely disabled, no self care, confined to bed or chair []     5    Moribund   Past Medical History:  Diagnosis Date   Acne    on face spirolactone for   Anal cancer ([place]) 02/25/2021   Constipation, chronic 08/08/2016   History of migraine    none in years   Wears glasses     Past Surgical History:  Procedure Laterality Date   ABDOMINAL HYSTERECTOMY  2016   partial   COLONOSCOPY     COLONOSCOPY WITH PROPOFOL N/A 01/28/2021    Procedure: COLONOSCOPY WITH PROPOFOL;  Surgeon: Virgel Manifold, MD;  Location: [facility];  Service: Endoscopy;  Laterality: N/A;   EXCISION HYDRADENITIS LABIA Left 02/25/2021   Procedure: EXCISION of  LABIAL LESIOM;  Surgeon: Michael Boston, MD;  Location: [place];  Service: General;  Laterality: Left;   IR FLUORO GUIDE CV LINE RIGHT  04/18/2021   LAPAROTOMY N/A 12/03/2021   Procedure: EXPLORATORY LAPAROTOMY, LYSIS OF ADHESIONS;  Surgeon: Ralene Ok, MD;  Location: [place];  Service: General;  Laterality: N/A;   OSTOMY N/A 11/03/2021   Procedure: END COLOSTOMY;  Surgeon: Michael Boston, MD;  Location: [facility];  Service: General;  Laterality: N/A;   PECTORALIS FLAP N/A 11/03/2021   Procedure: VERTICAL RECTUS ABDOMINIS MYOCUTANEOUS TO PERINEUM;  Surgeon: Irene Limbo, MD;  Location: [facility];  Service: Plastics;  Laterality: N/A;   RECTAL BIOPSY N/A 09/30/2021   Procedure: ANORECTAL MASS BIOPSY;  Surgeon: Michael Boston, MD;  Location: [facility];  Service: General;  Laterality: N/A;   RECTAL EXAM UNDER  ANESTHESIA N/A 02/25/2021   Procedure: ANORECTAL EXAM UNDER ANESTHESIA;  Surgeon: Karie Soda, MD;  Location: [facility];  Service: General;  Laterality: N/A;   RECTAL EXAM UNDER ANESTHESIA N/A 09/30/2021   Procedure: ANORECTAL EXAMINATION UNDER ANESTHESIA;  Surgeon: Karie Soda, MD;  Location: [facility];  Service: General;  Laterality: N/A;  GEN & LOCAL ANESTHESIA   SPHINCTEROTOMY N/A 08/30/2016   Procedure: LATERAL INTERNAL AND SPHINCTEROTOMY;  Surgeon: Karie Soda, MD;  Location: [facility];  Service: General;  Laterality: N/A;   TONSILLECTOMY  as child   TRANSANAL EXCISION OF RECTAL MASS N/A 02/25/2021   Procedure: TRANSANAL EXCISIONAL  BIOPSY OF ANAL CANAL MASS;  Surgeon: Karie Soda, MD;  Location: [facility];  Service: General;  Laterality: N/A;   XI ROBOT ABDOMINAL PERINEAL RESECTION N/A 11/03/2021   Procedure: XI ROBOT  ABDOMINOPERINEAL RESECTION;  Surgeon: Karie Soda, MD;  Location: [facility];  Service: General;  Laterality: N/A;    Family History  Problem Relation Age of Onset   Breast cancer Paternal Aunt     Social History Social History   Tobacco Use   Smoking status: Former    Packs/day: 1.00    Years: 8.00    Total pack years: 8.00    Types: Cigarettes    Quit date: 02/15/2021    Years since quitting: 1.8   Smokeless tobacco: Never  Vaping Use   Vaping Use: Never used  Substance Use Topics   Alcohol use: Yes    Comment: 2 dirnks daily   Drug use: No    Current Outpatient Medications  Medication Sig Dispense Refill   acetaminophen (TYLENOL) 500 MG tablet Take 500 mg by mouth every 6 (six) hours as needed for mild pain.     cetirizine (ZYRTEC) 10 MG tablet Take 10 mg by mouth daily.     gabapentin (NEURONTIN) 600 MG tablet Take 600 mg by mouth every 8 (eight) hours.     glipiZIDE (GLUCOTROL XL) 2.5 MG 24 hr tablet Take 2.5 mg by mouth every morning.     hydrOXYzine (ATARAX) 25 MG tablet Take 25 mg by mouth at bedtime.     SEMGLEE, YFGN, 100 UNIT/ML Pen Inject into the skin.     spironolactone (ALDACTONE) 100 MG tablet Take 100 mg by mouth daily.     TRIAMCINOLONE ACETONIDE, TOP, 0.05 % OINT Apply 1 application topically 4 (four) times daily as needed (eczema).     valACYclovir (VALTREX) 500 MG tablet Take 500 mg by mouth daily.     Biotin w/ Vitamins C & E (HAIR SKIN & NAILS GUMMIES PO) Take 2 tablets by mouth daily. Gummies (Patient not taking: Reported on 12/21/2022)     cyclobenzaprine (FLEXERIL) 5 MG tablet Take 5 mg by mouth 3 (three) times daily as needed for muscle spasms. (Patient not taking: Reported on 12/21/2022)     ondansetron (ZOFRAN) 8 MG tablet Take 8 mg by mouth every 8 (eight) hours as needed. (Patient not taking: Reported on 09/14/2022)     ondansetron (ZOFRAN-ODT) 4 MG disintegrating tablet Take 4 mg by mouth every 8 (eight) hours as needed for nausea/vomiting. (Patient  not taking: Reported on 09/14/2022)     oxyCODONE (OXY IR/ROXICODONE) 5 MG immediate release tablet Take 1-2 tablets (5-10 mg total) by mouth every 6 (six) hours as needed for moderate pain, severe pain or breakthrough pain. 30 tablet 0   No current facility-administered medications for this visit.    Allergies  Allergen Reactions  Clindamycin/Lincomycin Rash   Sulfa Drugs Cross Reactors Itching and Rash    Review of Systems  Constitutional:  Negative for activity change, appetite change, fever and unexpected weight change.  HENT:  Negative for trouble swallowing and voice change.   Eyes:  Negative for visual disturbance.  Respiratory:  Negative for cough, shortness of breath and wheezing.   Cardiovascular:  Negative for chest pain and leg swelling.  Gastrointestinal:        Colostomy  Genitourinary:  Negative for difficulty urinating and dysuria.  Neurological:  Negative for syncope and weakness.  Hematological:  Does not bruise/bleed easily.  All other systems reviewed and are negative.   BP (!) 150/99   Pulse 90   Resp 20   Ht 5\' 8"  (1.727 m)   Wt 176 lb (79.8 kg)   LMP 03/13/2012   SpO2 97%   BMI 26.76 kg/m  Physical Exam Vitals reviewed.  Constitutional:      Appearance: Normal appearance.  HENT:     Head: Normocephalic and atraumatic.  Eyes:     General: No scleral icterus.    Extraocular Movements: Extraocular movements intact.  Cardiovascular:     Rate and Rhythm: Normal rate and regular rhythm.     Heart sounds: Normal heart sounds. No murmur heard.    No friction rub. No gallop.  Pulmonary:     Effort: No respiratory distress.     Breath sounds: Normal breath sounds. No wheezing or rales.  Abdominal:     Palpations: Abdomen is soft.     Comments: Multiple healed scars and ostomy  Musculoskeletal:     Cervical back: Neck supple.  Lymphadenopathy:     Cervical: No cervical adenopathy.  Skin:    General: Skin is warm and dry.  Neurological:      General: No focal deficit present.     Mental Status: She is alert.     Cranial Nerves: No cranial nerve deficit.     Motor: No weakness.    Diagnostic Tests: CT CHEST, ABDOMEN, AND PELVIS WITH CONTRAST   TECHNIQUE: Multidetector CT imaging of the chest, abdomen and pelvis was performed following the standard protocol during bolus administration of intravenous contrast.   RADIATION DOSE REDUCTION: This exam was performed according to the departmental dose-optimization program which includes automated exposure control, adjustment of the mA and/or kV according to patient size and/or use of iterative reconstruction technique.   CONTRAST:  4mL OMNIPAQUE IOHEXOL 300 MG/ML  SOLN   COMPARISON:  Chest CT 12/07/2021.  Abdomen and pelvis CT 12/03/2021.   FINDINGS: CT CHEST FINDINGS   Cardiovascular: The heart size is normal. No substantial pericardial effusion. Coronary artery calcification is evident. No thoracic aortic aneurysm.   Mediastinum/Nodes: No mediastinal lymphadenopathy. There is no hilar lymphadenopathy. The esophagus has normal imaging features. There is no axillary lymphadenopathy.   Lungs/Pleura: 2.6 x 2.5 cm right lower lobe pulmonary nodule on image 107/series 4 is new in the interval. No other suspicious new pulmonary nodule or mass. No focal airspace consolidation. There is no evidence of pleural effusion.   Musculoskeletal: No worrisome lytic or sclerotic osseous abnormality.   CT ABDOMEN PELVIS FINDINGS   Hepatobiliary: No suspicious focal abnormality within the liver parenchyma. There is no evidence for gallstones, gallbladder wall thickening, or pericholecystic fluid. No intrahepatic or extrahepatic biliary dilation.   Pancreas: No focal mass lesion. No dilatation of the main duct. No intraparenchymal cyst. No peripancreatic edema.   Spleen: No splenomegaly. No focal mass lesion.  Adrenals/Urinary Tract: No adrenal nodule or mass.  Kidneys unremarkable. No evidence for hydroureter. The urinary bladder appears normal for the degree of distention.   Stomach/Bowel: Stomach is unremarkable. No gastric wall thickening. No evidence of outlet obstruction. Duodenum is normally positioned as is the ligament of Treitz. No small bowel wall thickening. No small bowel dilatation. The terminal ileum is normal. The appendix is not well visualized, but there is no edema or inflammation in the region of the cecum. Status post AVR. No gross colonic mass. No colonic wall thickening. Left abdominal sigmoid end colostomy noted. Transverse colon takes a low coarsened is tethered posteriorly to the presacral space.   Vascular/Lymphatic: There is mild atherosclerotic calcification of the abdominal aorta without aneurysm. There is no gastrohepatic or hepatoduodenal ligament lymphadenopathy. No retroperitoneal or mesenteric lymphadenopathy. No pelvic sidewall lymphadenopathy.   Reproductive: Hysterectomy There is no adnexal mass.   Other: No intraperitoneal free fluid. Surgical changes are noted in the low pelvis and presacral space.   Musculoskeletal: No worrisome lytic or sclerotic osseous abnormality.   IMPRESSION: 1. 2.6 x 2.5 cm right lower lobe pulmonary nodule, new in the interval. Imaging features are consistent with solitary metastatic disease to the lung. 2. No evidence for metastatic disease in the abdomen or pelvis. 3. Status post APR with left abdominal sigmoid end colostomy. Low course of the transverse colon with tethering posteriorly into the presacral space.   These results will be called to the ordering clinician or representative by the Radiologist Assistant, and communication documented in the PACS or [employer].     Electronically Signed   By: Misty Stanley M.D.   On: 12/04/2022 11:16  I personally reviewed the CT images and also reviewed the PET/CT images.  The official reading of the PET/CT has  not been done yet.  2.6 x 2.8 cm right lower lobe mass that is markedly hypermetabolic on PET/CT.  No evidence of regional or distant metastatic disease per my read.  Await formal radiology report.  Pulmonary function testing 12/19/2022 FVC 3.63 (90%) FEV1 2.65 (84%) FEV1 2.85 (90%) postbronchodilator DLCO 18.20 (76%)  Impression: Beth [name] is a 52 year old woman with a history of tobacco use and anal cancer who now has a 2.8 x 2.6 cm right lower lobe mass that is markedly hypermetabolic on PET/CT.  Differential diagnosis includes primary bronchogenic carcinoma versus metastatic anal cancer.  To my reading the PET/CT does not show any other sites of disease.  Even if this is metastatic disease is oligometastatic and potentially curable.  If it is a primary bronchogenic carcinoma it is certainly resectable.  We discussed options including bronchoscopic biopsy treatment with radiation and/or chemotherapy, and surgical resection.  She understands there is no guarantee of a cure with surgical resection but there is a possibility for cure if this is indeed oligometastatic disease or a primary lung cancer.  Based on the size of the lesion and its relatively central location I think a lobectomy would be necessary to achieve a complete resection.  I informed her of the general nature of the procedure including the need for general anesthesia, the incisions to be used, the use of the surgical robot, use of drains to postoperatively, the expected hospital stay, and the overall recovery.  I informed her of the indications, risk, benefits, and alternatives.  She understands the risks include, but not limited to death, MI, DVT, PE, bleeding, possible need for transfusion, possible need for conversion to an open procedure, prolonged air leak, cardiac  arrhythmias, as well as the possibility of other unforeseeable complications.  She understands and accepts the risk and wishes to proceed with  surgery.  Plan: Robotic right lower lobectomy on Friday, 12/29/2022  Loreli Slot, MD [facility] [phone]

## 2022-12-21 NOTE — Progress Notes (Signed)
PCP is Caryl Bis, MD Referring Provider is Owens Shark, NP  Chief Complaint  Patient presents with   Lung Lesion    Surgical consult/ Chest CT 12/04/22/ PET scan  12/21/22/ PFT's 12/19/22    HPI: Mrs. Beth [name] is sent for consultation regarding a right lower lobe lung mass.  Beth [name] is a 52 year old woman with a history of tobacco abuse, anal cancer, and migraines.  She was diagnosed with anal cancer in 2021.  She was treated with chemo and radiation.  She ultimately ended up having a AP resection with end colostomy and a rectus flap to the perineum.  Recently she had a routine follow-up CT which showed a new 2.5 x 2.6 cm mass in the right lower lobe.  She does have a history of tobacco use.  She smoked about a pack a day for 25 years prior to quitting 2 years ago.  She denies any chest pain, pressure, tightness, shortness of breath, cough, wheezing, or hemoptysis.  She is gained some weight recently.  No problems with appetite.  No unusual headaches or visual changes.  Zubrod Score: At the time of surgery this patient's most appropriate activity status/level should be described as: [x]     0    Normal activity, no symptoms []     1    Restricted in physical strenuous activity but ambulatory, able to do out light work []     2    Ambulatory and capable of self care, unable to do work activities, up and about >50 % of waking hours                              []     3    Only limited self care, in bed greater than 50% of waking hours []     4    Completely disabled, no self care, confined to bed or chair []     5    Moribund   Past Medical History:  Diagnosis Date   Acne    on face spirolactone for   Anal cancer ([place]) 02/25/2021   Constipation, chronic 08/08/2016   History of migraine    none in years   Wears glasses     Past Surgical History:  Procedure Laterality Date   ABDOMINAL HYSTERECTOMY  2016   partial   COLONOSCOPY     COLONOSCOPY WITH PROPOFOL N/A 01/28/2021    Procedure: COLONOSCOPY WITH PROPOFOL;  Surgeon: Virgel Manifold, MD;  Location: [facility];  Service: Endoscopy;  Laterality: N/A;   EXCISION HYDRADENITIS LABIA Left 02/25/2021   Procedure: EXCISION of  LABIAL LESIOM;  Surgeon: Michael Boston, MD;  Location: [place];  Service: General;  Laterality: Left;   IR FLUORO GUIDE CV LINE RIGHT  04/18/2021   LAPAROTOMY N/A 12/03/2021   Procedure: EXPLORATORY LAPAROTOMY, LYSIS OF ADHESIONS;  Surgeon: Ralene Ok, MD;  Location: [place];  Service: General;  Laterality: N/A;   OSTOMY N/A 11/03/2021   Procedure: END COLOSTOMY;  Surgeon: Michael Boston, MD;  Location: [facility];  Service: General;  Laterality: N/A;   PECTORALIS FLAP N/A 11/03/2021   Procedure: VERTICAL RECTUS ABDOMINIS MYOCUTANEOUS TO PERINEUM;  Surgeon: Irene Limbo, MD;  Location: [facility];  Service: Plastics;  Laterality: N/A;   RECTAL BIOPSY N/A 09/30/2021   Procedure: ANORECTAL MASS BIOPSY;  Surgeon: Michael Boston, MD;  Location: [facility];  Service: General;  Laterality: N/A;   RECTAL EXAM UNDER  ANESTHESIA N/A 02/25/2021   Procedure: ANORECTAL EXAM UNDER ANESTHESIA;  Surgeon: Michael Boston, MD;  Location: [place];  Service: General;  Laterality: N/A;   RECTAL EXAM UNDER ANESTHESIA N/A 09/30/2021   Procedure: ANORECTAL EXAMINATION UNDER ANESTHESIA;  Surgeon: Michael Boston, MD;  Location: [facility];  Service: General;  Laterality: N/A;  GEN & LOCAL ANESTHESIA   SPHINCTEROTOMY N/A 08/30/2016   Procedure: LATERAL INTERNAL AND SPHINCTEROTOMY;  Surgeon: Michael Boston, MD;  Location: [facility];  Service: General;  Laterality: N/A;   TONSILLECTOMY  as child   TRANSANAL EXCISION OF RECTAL MASS N/A 02/25/2021   Procedure: TRANSANAL EXCISIONAL  BIOPSY OF ANAL CANAL MASS;  Surgeon: Michael Boston, MD;  Location: [place];  Service: General;  Laterality: N/A;   XI ROBOT ABDOMINAL PERINEAL RESECTION N/A 11/03/2021   Procedure: XI ROBOT  ABDOMINOPERINEAL RESECTION;  Surgeon: Michael Boston, MD;  Location: [facility];  Service: General;  Laterality: N/A;    Family History  Problem Relation Age of Onset   Breast cancer Paternal Aunt     Social History Social History   Tobacco Use   Smoking status: Former    Packs/day: 1.00    Years: 8.00    Total pack years: 8.00    Types: Cigarettes    Quit date: 02/15/2021    Years since quitting: 1.8   Smokeless tobacco: Never  Vaping Use   Vaping Use: Never used  Substance Use Topics   Alcohol use: Yes    Comment: 2 dirnks daily   Drug use: No    Current Outpatient Medications  Medication Sig Dispense Refill   acetaminophen (TYLENOL) 500 MG tablet Take 500 mg by mouth every 6 (six) hours as needed for mild pain.     cetirizine (ZYRTEC) 10 MG tablet Take 10 mg by mouth daily.     gabapentin (NEURONTIN) 600 MG tablet Take 600 mg by mouth every 8 (eight) hours.     glipiZIDE (GLUCOTROL XL) 2.5 MG 24 hr tablet Take 2.5 mg by mouth every morning.     hydrOXYzine (ATARAX) 25 MG tablet Take 25 mg by mouth at bedtime.     SEMGLEE, YFGN, 100 UNIT/ML Pen Inject into the skin.     spironolactone (ALDACTONE) 100 MG tablet Take 100 mg by mouth daily.     TRIAMCINOLONE ACETONIDE, TOP, 0.05 % OINT Apply 1 application topically 4 (four) times daily as needed (eczema).     valACYclovir (VALTREX) 500 MG tablet Take 500 mg by mouth daily.     Biotin w/ Vitamins C & E (HAIR SKIN & NAILS GUMMIES PO) Take 2 tablets by mouth daily. Gummies (Patient not taking: Reported on 12/21/2022)     cyclobenzaprine (FLEXERIL) 5 MG tablet Take 5 mg by mouth 3 (three) times daily as needed for muscle spasms. (Patient not taking: Reported on 12/21/2022)     ondansetron (ZOFRAN) 8 MG tablet Take 8 mg by mouth every 8 (eight) hours as needed. (Patient not taking: Reported on 09/14/2022)     ondansetron (ZOFRAN-ODT) 4 MG disintegrating tablet Take 4 mg by mouth every 8 (eight) hours as needed for nausea/vomiting. (Patient  not taking: Reported on 09/14/2022)     oxyCODONE (OXY IR/ROXICODONE) 5 MG immediate release tablet Take 1-2 tablets (5-10 mg total) by mouth every 6 (six) hours as needed for moderate pain, severe pain or breakthrough pain. 30 tablet 0   No current facility-administered medications for this visit.    Allergies  Allergen Reactions  Clindamycin/Lincomycin Rash   Sulfa Drugs Cross Reactors Itching and Rash    Review of Systems  Constitutional:  Negative for activity change, appetite change, fever and unexpected weight change.  HENT:  Negative for trouble swallowing and voice change.   Eyes:  Negative for visual disturbance.  Respiratory:  Negative for cough, shortness of breath and wheezing.   Cardiovascular:  Negative for chest pain and leg swelling.  Gastrointestinal:        Colostomy  Genitourinary:  Negative for difficulty urinating and dysuria.  Neurological:  Negative for syncope and weakness.  Hematological:  Does not bruise/bleed easily.  All other systems reviewed and are negative.   BP (!) 150/99   Pulse 90   Resp 20   Ht 5\' 8"  (1.727 m)   Wt 176 lb (79.8 kg)   LMP 03/13/2012   SpO2 97%   BMI 26.76 kg/m  Physical Exam Vitals reviewed.  Constitutional:      Appearance: Normal appearance.  HENT:     Head: Normocephalic and atraumatic.  Eyes:     General: No scleral icterus.    Extraocular Movements: Extraocular movements intact.  Cardiovascular:     Rate and Rhythm: Normal rate and regular rhythm.     Heart sounds: Normal heart sounds. No murmur heard.    No friction rub. No gallop.  Pulmonary:     Effort: No respiratory distress.     Breath sounds: Normal breath sounds. No wheezing or rales.  Abdominal:     Palpations: Abdomen is soft.     Comments: Multiple healed scars and ostomy  Musculoskeletal:     Cervical back: Neck supple.  Lymphadenopathy:     Cervical: No cervical adenopathy.  Skin:    General: Skin is warm and dry.  Neurological:      General: No focal deficit present.     Mental Status: She is alert.     Cranial Nerves: No cranial nerve deficit.     Motor: No weakness.    Diagnostic Tests: CT CHEST, ABDOMEN, AND PELVIS WITH CONTRAST   TECHNIQUE: Multidetector CT imaging of the chest, abdomen and pelvis was performed following the standard protocol during bolus administration of intravenous contrast.   RADIATION DOSE REDUCTION: This exam was performed according to the departmental dose-optimization program which includes automated exposure control, adjustment of the mA and/or kV according to patient size and/or use of iterative reconstruction technique.   CONTRAST:  96mL OMNIPAQUE IOHEXOL 300 MG/ML  SOLN   COMPARISON:  Chest CT 12/07/2021.  Abdomen and pelvis CT 12/03/2021.   FINDINGS: CT CHEST FINDINGS   Cardiovascular: The heart size is normal. No substantial pericardial effusion. Coronary artery calcification is evident. No thoracic aortic aneurysm.   Mediastinum/Nodes: No mediastinal lymphadenopathy. There is no hilar lymphadenopathy. The esophagus has normal imaging features. There is no axillary lymphadenopathy.   Lungs/Pleura: 2.6 x 2.5 cm right lower lobe pulmonary nodule on image 107/series 4 is new in the interval. No other suspicious new pulmonary nodule or mass. No focal airspace consolidation. There is no evidence of pleural effusion.   Musculoskeletal: No worrisome lytic or sclerotic osseous abnormality.   CT ABDOMEN PELVIS FINDINGS   Hepatobiliary: No suspicious focal abnormality within the liver parenchyma. There is no evidence for gallstones, gallbladder wall thickening, or pericholecystic fluid. No intrahepatic or extrahepatic biliary dilation.   Pancreas: No focal mass lesion. No dilatation of the main duct. No intraparenchymal cyst. No peripancreatic edema.   Spleen: No splenomegaly. No focal mass lesion.  Adrenals/Urinary Tract: No adrenal nodule or mass.  Kidneys unremarkable. No evidence for hydroureter. The urinary bladder appears normal for the degree of distention.   Stomach/Bowel: Stomach is unremarkable. No gastric wall thickening. No evidence of outlet obstruction. Duodenum is normally positioned as is the ligament of Treitz. No small bowel wall thickening. No small bowel dilatation. The terminal ileum is normal. The appendix is not well visualized, but there is no edema or inflammation in the region of the cecum. Status post AVR. No gross colonic mass. No colonic wall thickening. Left abdominal sigmoid end colostomy noted. Transverse colon takes a low coarsened is tethered posteriorly to the presacral space.   Vascular/Lymphatic: There is mild atherosclerotic calcification of the abdominal aorta without aneurysm. There is no gastrohepatic or hepatoduodenal ligament lymphadenopathy. No retroperitoneal or mesenteric lymphadenopathy. No pelvic sidewall lymphadenopathy.   Reproductive: Hysterectomy There is no adnexal mass.   Other: No intraperitoneal free fluid. Surgical changes are noted in the low pelvis and presacral space.   Musculoskeletal: No worrisome lytic or sclerotic osseous abnormality.   IMPRESSION: 1. 2.6 x 2.5 cm right lower lobe pulmonary nodule, new in the interval. Imaging features are consistent with solitary metastatic disease to the lung. 2. No evidence for metastatic disease in the abdomen or pelvis. 3. Status post APR with left abdominal sigmoid end colostomy. Low course of the transverse colon with tethering posteriorly into the presacral space.   These results will be called to the ordering clinician or representative by the Radiologist Assistant, and communication documented in the PACS or [employer].     Electronically Signed   By: Misty Stanley M.D.   On: 12/04/2022 11:16  I personally reviewed the CT images and also reviewed the PET/CT images.  The official reading of the PET/CT has  not been done yet.  2.6 x 2.8 cm right lower lobe mass that is markedly hypermetabolic on PET/CT.  No evidence of regional or distant metastatic disease per my read.  Await formal radiology report.  Pulmonary function testing 12/19/2022 FVC 3.63 (90%) FEV1 2.65 (84%) FEV1 2.85 (90%) postbronchodilator DLCO 18.20 (76%)  Impression: Beth [name] is a 52 year old woman with a history of tobacco use and anal cancer who now has a 2.8 x 2.6 cm right lower lobe mass that is markedly hypermetabolic on PET/CT.  Differential diagnosis includes primary bronchogenic carcinoma versus metastatic anal cancer.  To my reading the PET/CT does not show any other sites of disease.  Even if this is metastatic disease is oligometastatic and potentially curable.  If it is a primary bronchogenic carcinoma it is certainly resectable.  We discussed options including bronchoscopic biopsy treatment with radiation and/or chemotherapy, and surgical resection.  She understands there is no guarantee of a cure with surgical resection but there is a possibility for cure if this is indeed oligometastatic disease or a primary lung cancer.  Based on the size of the lesion and its relatively central location I think a lobectomy would be necessary to achieve a complete resection.  I informed her of the general nature of the procedure including the need for general anesthesia, the incisions to be used, the use of the surgical robot, use of drains to postoperatively, the expected hospital stay, and the overall recovery.  I informed her of the indications, risk, benefits, and alternatives.  She understands the risks include, but not limited to death, MI, DVT, PE, bleeding, possible need for transfusion, possible need for conversion to an open procedure, prolonged air leak, cardiac  arrhythmias, as well as the possibility of other unforeseeable complications.  She understands and accepts the risk and wishes to proceed with  surgery.  Plan: Robotic right lower lobectomy on Friday, 12/29/2022  Melrose Nakayama, MD [facility] [phone]

## 2022-12-22 ENCOUNTER — Other Ambulatory Visit: Payer: Self-pay | Admitting: *Deleted

## 2022-12-22 ENCOUNTER — Encounter: Payer: Self-pay | Admitting: *Deleted

## 2022-12-22 DIAGNOSIS — R918 Other nonspecific abnormal finding of lung field: Secondary | ICD-10-CM

## 2022-12-26 ENCOUNTER — Inpatient Hospital Stay: Payer: BC Managed Care – PPO | Attending: Oncology | Admitting: Oncology

## 2022-12-26 VITALS — BP 145/91 | HR 99 | Temp 98.1°F | Resp 18 | Ht 68.0 in | Wt 176.0 lb

## 2022-12-26 DIAGNOSIS — C210 Malignant neoplasm of anus, unspecified: Secondary | ICD-10-CM | POA: Diagnosis not present

## 2022-12-26 NOTE — Progress Notes (Signed)
[place] OFFICE PROGRESS NOTE   Diagnosis: Anal cancer  INTERVAL HISTORY:   Beth [name] feels well.  The colostomy is functioning well.  She is not smoking.  No new complaint.  She underwent surveillance CTs on 12/04/2022.  There is a new 2.6 x 2.5 cm right lower lobe nodule.  No other evidence of metastatic disease. PET scan 12/21/2022 confirmed an FDG avid right lower lobe nodule and other evidence of metastatic disease.  She was referred to Dr. Roxan Hockey on 12/21/2022.  He discussed treatment options with her including a biopsy.  She decided to proceed with resection of the lung mass.  She is scheduled for surgery on 12/29/2022.   Objective:  Vital signs in last 24 hours:  Blood pressure (!) 145/91, pulse 99, temperature 98.1 F (36.7 C), temperature source Oral, resp. rate 18, height 5\' 8"  (1.727 m), weight 176 lb (79.8 kg), last menstrual period 03/13/2012, SpO2 100 %.   Lymphatics: No cervical, supraclavicular, axillary, or inguinal nodes Resp: Clear bilaterally Cardio: Regular rate and rhythm GI: No hepatosplenomegaly, left abdomen colostomy Vascular: Leg edema  Skin: Perineal scar without evidence of recurrent tumor  Lab Results:  Lab Results  Component Value Date   WBC 4.2 09/08/2022   HGB 13.9 09/08/2022   HCT 39.8 09/08/2022   MCV 98.3 09/08/2022   PLT 166 09/08/2022   NEUTROABS 10.9 (H) 12/03/2021    CMP  Lab Results  Component Value Date   NA 141 12/04/2022   K 4.0 12/04/2022   CL 104 12/04/2022   CO2 28 12/04/2022   GLUCOSE 131 (H) 12/04/2022   BUN 11 12/04/2022   CREATININE 0.54 12/04/2022   CALCIUM 10.0 12/04/2022   PROT 8.3 (H) 12/03/2021   ALBUMIN 4.3 12/03/2021   AST 62 (H) 12/03/2021   ALT 62 (H) 12/03/2021   ALKPHOS 91 12/03/2021   BILITOT 0.8 12/03/2021   GFRNONAA >60 12/04/2022   GFRAA 106 11/29/2020     Medications: I have reviewed the patient's current medications.   Assessment/Plan:  Squamous cell carcinoma of  the anal canal Colonoscopy 01/28/2021-rectal mass extending to the "outside ", palpable on exam-biopsy revealed high-grade squamous intraepithelial lesion (AIN 3/carcinoma in situ) Exam under anesthesia with transanal excision of an anal canal mass 02/25/2021-frozen section consistent with squamous cell carcinoma, 4 x 4 centimeter sessile anal canal mass extending to the anal verge, right lateral and posterior, 30% circumferential, fixed to the anal sphincter complex; anal rectal mass with mixed high-grade neuroendocrine squamous cell carcinoma; posterior anal tag with squamous cell carcinoma; left labial lesion-condyloma acuminata CTs 03/07/2021-ill-definition of tissue planes along the anus with abnormal soft tissue prominence posteriorly along the anal canal suspicious for mass.  Small adjacent lower perirectal lymph nodes in addition to a 0.5 cm sacral node.  Focal enhancing lesion in the right hepatic lobe 1.2 x 0.8 x 0.9 cm.  2.1 x 1.5 cm cystic lesion right ovary with thin enhancing rim. PET scan 03/16/2021-hypermetabolic mass within the anal canal.  No definite evidence of local or distant metastatic disease.  7 mm left submandibular lymph node with mild FDG uptake, likely reactive.  Focal site of FDG uptake within the inner quadrant of the right breast. Radiation 03/21/2021-04/27/2021 Cycle one 5-FU/mitomycin-C 03/21/2021 Cycle two 5-FU/Mitomycin-C 04/18/2021, 5-FU dose reduced secondary to mucositis Anoscopy by Dr. Johney Maine 07/20/2021-divet in the right posterior lateral aspect with some right anterior and right lateral folds consistent with radiation treatment.  No ulceration.  Not fully resolved but markedly  shrunk down. Anorectal mass 09/30/2021 examination under anesthesia, biopsy-poorly differentiated carcinoma-basaloid squamous carcinoma and high-grade neuroendocrine carcinoma, margins positive APR/and vertical rectus abdominis flap 11/03/2021, invasive poorly differentiated carcinoma, negative resection  margins, 0/10 nodes, treatment response score 2,ypT1,ypN0 CT abdomen/pelvis 12/03/2021-high-grade small bowel obstruction transitioning in the left lower abdomen.  Presacral rim-enhancing 4.4 x 2.8 x 3.8 cm fluid collection.  No air in the collection.  Prior study described a potential abnormality in the right lobe of the liver, not seen on current study.  Cystic lesion of the right ovary resolved. CT chest 12/07/2021-interval development of bilateral trace pleural effusions. CTs 12/04/2022-new 2.6 x 2.5 cm right lower lobe nodule PET 12/21/2022-FDG avid right lower lobe nodule, no other evidence of metastatic disease   Remote history of an anal fissure repair Tobacco use Moderate alcohol use Eczema Right breast mass- PET scan 03/16/2021 with focal FDG activity in the inner right breast Mammogram and right breast ultrasound 03/28/2021- negative Bilateral breast MRI 03/31/2021- 7 mm mass in the upper inner right breast correlating with the PET findings, no other abnormality, no adenopathy MRI guided biopsy of right breast mass 04/11/2021- fibrocystic change with sclerosing adenosis, small focus of inflammation/fibrosis suggestive of resolving fat necrosis, no evidence of malignancy--repeat bilateral breast MRI at a 39-month interval; bilateral breast MRI on 10/13/2021-no evidence of breast malignancy.  Annual screening mammography recommended.  7.  Admission with small bowel obstruction 12/03/2021 status post exploratory laparotomy and lysis of adhesions       Disposition: Beth [name] has a history of anal cancer.  She now has a hypermetabolic right lower lobe nodule.  There is no other evidence of metastatic disease.  She is asymptomatic.  I discussed the differential diagnosis with her.  She understands the nodule could represent a metastasis from anal cancer, a primary bronchogenic carcinoma, or a metastasis.  She would like to proceed with resection of the lung mass.  This is scheduled for 12/29/2022.   We will see her a few weeks after surgery to review the pathology and discuss adjuvant therapy.  [name], MD  12/26/2022  4:18 PM

## 2022-12-26 NOTE — Progress Notes (Signed)
Surgical Instructions    Your procedure is scheduled on Friday January 12th.  Report to [facility] Main Entrance "A" at 5:30 A.M., then check in with the Admitting office.  Call this number if you have problems the morning of surgery:  [phone]   If you have any questions prior to your surgery date call [phone]: Open Monday-Friday 8am-4pm If you experience any cold or flu symptoms such as cough, fever, chills, shortness of breath, etc. between now and your scheduled surgery, please notify us at the above number     Remember:  Do not eat or drink after midnight the night before your surgery      Take these medicines the morning of surgery with A SIP OF WATER: cetirizine (ZYRTEC) 10 MG tablet  gabapentin (NEURONTIN) 600 MG tablet  valACYclovir (VALTREX) 500 MG tablet   IF NEEDED  acetaminophen (TYLENOL) 500 MG tablet     As of today, STOP taking any Aspirin (unless otherwise instructed by your surgeon) Aleve, Naproxen, Ibuprofen, Motrin, Advil, Goody's, BC's, all herbal medications, fish oil, and all vitamins.  WHAT DO I DO ABOUT MY DIABETES MEDICATION?   Do not take oral diabetes medicines (glipizide) the morning of surgery.  THE NIGHT BEFORE SURGERY, take ____4_______ units of __Semglee_________insulin.       The day of surgery, do not take other diabetes injectables, including Byetta (exenatide), Bydureon (exenatide ER), Victoza (liraglutide), or Trulicity (dulaglutide).  If your CBG is greater than 220 mg/dL, you may take  of your sliding scale (correction) dose of insulin.   HOW TO MANAGE YOUR DIABETES BEFORE AND AFTER SURGERY  Why is it important to control my blood sugar before and after surgery? Improving blood sugar levels before and after surgery helps healing and can limit problems. A way of improving blood sugar control is eating a healthy diet by:  Eating less sugar and carbohydrates  Increasing activity/exercise  Talking with your doctor about  reaching your blood sugar goals High blood sugars (greater than 180 mg/dL) can raise your risk of infections and slow your recovery, so you will need to focus on controlling your diabetes during the weeks before surgery. Make sure that the doctor who takes care of your diabetes knows about your planned surgery including the date and location.  How do I manage my blood sugar before surgery? Check your blood sugar at least 4 times a day, starting 2 days before surgery, to make sure that the level is not too high or low.  Check your blood sugar the morning of your surgery when you wake up and every 2 hours until you get to the Short Stay unit.  If your blood sugar is less than 70 mg/dL, you will need to treat for low blood sugar: Do not take insulin. Treat a low blood sugar (less than 70 mg/dL) with  cup of clear juice (cranberry or apple), 4 glucose tablets, OR glucose gel. Recheck blood sugar in 15 minutes after treatment (to make sure it is greater than 70 mg/dL). If your blood sugar is not greater than 70 mg/dL on recheck, call [phone] for further instructions. Report your blood sugar to the short stay nurse when you get to Short Stay.  If you are admitted to the hospital after surgery: Your blood sugar will be checked by the staff and you will probably be given insulin after surgery (instead of oral diabetes medicines) to make sure you have good blood sugar levels. The goal for blood sugar control after  surgery is 80-180 mg/dL.     DAY OF SURGERY      Do not wear jewelry or makeup. Do not wear lotions, powders, perfumes or deodorant. Do not shave 48 hours prior to surgery.   Do not bring valuables to the hospital. Do not wear nail polish, gel polish, artificial nails, or any other type of covering on natural nails (fingers and toes) If you have artificial nails or gel coating that need to be removed by a nail salon, please have this removed prior to surgery. Artificial nails or gel  coating may interfere with anesthesia's ability to adequately monitor your vital signs.  [facility] is not responsible for any belongings or valuables.    Do NOT Smoke (Tobacco/Vaping)  24 hours prior to your procedure  If you use a CPAP at night, you may bring your mask for your overnight stay.   Contacts, glasses, hearing aids, dentures or partials may not be worn into surgery, please bring cases for these belongings   For patients admitted to the hospital, discharge time will be determined by your treatment team.   Patients discharged the day of surgery will not be allowed to drive home, and someone needs to stay with them for 24 hours.   SURGICAL WAITING ROOM VISITATION Patients having surgery or a procedure may have no more than 2 support people in the waiting area - these visitors may rotate.   Children under the age of 52 must have an adult with them who is not the patient. If the patient needs to stay at the hospital during part of their recovery, the visitor guidelines for inpatient rooms apply. Pre-op nurse will coordinate an appropriate time for 1 support person to accompany patient in pre-op.  This support person may not rotate.   Please refer to [email or web address] for the visitor guidelines for Inpatients (after your surgery is over and you are in a regular room).    Special instructions:    Oral Hygiene is also important to reduce your risk of infection.  Remember - BRUSH YOUR TEETH THE MORNING OF SURGERY WITH YOUR REGULAR TOOTHPASTE   [facility]- Preparing For Surgery  Before surgery, you can play an important role. Because skin is not sterile, your skin needs to be as free of germs as possible. You can reduce the number of germs on your skin by washing with CHG (chlorahexidine gluconate) Soap before surgery.  CHG is an antiseptic cleaner which kills germs and bonds with the skin to continue killing germs even after  washing.     Please do not use if you have an allergy to CHG or antibacterial soaps. If your skin becomes reddened/irritated stop using the CHG.  Do not shave (including legs and underarms) for at least 48 hours prior to first CHG shower. It is OK to shave your face.  Please follow these instructions carefully.     Shower the NIGHT BEFORE SURGERY and the MORNING OF SURGERY with CHG Soap.   If you chose to wash your hair, wash your hair first as usual with your normal shampoo. After you shampoo, rinse your hair and body thoroughly to remove the shampoo.  Then [employer] and genitals (private parts) with your normal soap and rinse thoroughly to remove soap.  After that Use CHG Soap as you would any other liquid soap. You can apply CHG directly to the skin and wash gently with a scrungie or a clean washcloth.   Apply the CHG Soap to your  body ONLY FROM THE NECK DOWN.  Do not use on open wounds or open sores. Avoid contact with your eyes, ears, mouth and genitals (private parts). Wash Face and genitals (private parts)  with your normal soap.   Wash thoroughly, paying special attention to the area where your surgery will be performed.  Thoroughly rinse your body with warm water from the neck down.  DO NOT shower/wash with your normal soap after using and rinsing off the CHG Soap.  Pat yourself dry with a CLEAN TOWEL.  Wear CLEAN PAJAMAS to bed the night before surgery  Place CLEAN SHEETS on your bed the night before your surgery  DO NOT SLEEP WITH PETS.   Day of Surgery:  Take a shower with CHG soap. Wear Clean/Comfortable clothing the morning of surgery Do not apply any deodorants/lotions.   Remember to brush your teeth WITH YOUR REGULAR TOOTHPASTE.    If you received a COVID test during your pre-op visit, it is requested that you wear a mask when out in public, stay away from anyone that may not be feeling well, and notify your surgeon if you develop symptoms. If you have been in  contact with anyone that has tested positive in the last 10 days, please notify your surgeon.    Please read over the following fact sheets that you were given.

## 2022-12-27 ENCOUNTER — Other Ambulatory Visit: Payer: Self-pay

## 2022-12-27 ENCOUNTER — Ambulatory Visit (HOSPITAL_COMMUNITY)
Admission: RE | Admit: 2022-12-27 | Discharge: 2022-12-27 | Disposition: A | Discharge status: Home / Self Care | Payer: BC Managed Care – PPO | Source: Ambulatory Visit | Attending: Thoracic Surgery (Cardiothoracic Vascular Surgery) | Admitting: Thoracic Surgery (Cardiothoracic Vascular Surgery)

## 2022-12-27 ENCOUNTER — Encounter (HOSPITAL_COMMUNITY)
Admission: RE | Admit: 2022-12-27 | Discharge: 2022-12-27 | Disposition: A | Discharge status: Home / Self Care | Payer: BC Managed Care – PPO | Source: Ambulatory Visit | Attending: Thoracic Surgery (Cardiothoracic Vascular Surgery) | Admitting: Thoracic Surgery (Cardiothoracic Vascular Surgery)

## 2022-12-27 ENCOUNTER — Encounter (HOSPITAL_COMMUNITY): Payer: Self-pay

## 2022-12-27 VITALS — BP 134/94 | HR 93 | Temp 97.8°F | Resp 17 | Ht 68.0 in | Wt 174.4 lb

## 2022-12-27 DIAGNOSIS — Z01818 Encounter for other preprocedural examination: Secondary | ICD-10-CM

## 2022-12-27 DIAGNOSIS — E119 Type 2 diabetes mellitus without complications: Secondary | ICD-10-CM | POA: Insufficient documentation

## 2022-12-27 DIAGNOSIS — Z1152 Encounter for screening for COVID-19: Secondary | ICD-10-CM | POA: Insufficient documentation

## 2022-12-27 DIAGNOSIS — R918 Other nonspecific abnormal finding of lung field: Secondary | ICD-10-CM | POA: Insufficient documentation

## 2022-12-27 DIAGNOSIS — Z794 Long term (current) use of insulin: Secondary | ICD-10-CM

## 2022-12-27 HISTORY — DX: Herpesviral infection, unspecified: B00.9

## 2022-12-27 HISTORY — DX: Family history of other specified conditions: Z84.89

## 2022-12-27 LAB — CBC
HCT: 44.9 % (ref 36.0–46.0)
Hemoglobin: 14.6 g/dL (ref 12.0–15.0)
MCH: 33.7 pg (ref 26.0–34.0)
MCHC: 32.5 g/dL (ref 30.0–36.0)
MCV: 103.7 fL — ABNORMAL HIGH (ref 80.0–100.0)
Platelets: 146 10*3/uL — ABNORMAL LOW (ref 150–400)
RBC: 4.33 MIL/uL (ref 3.87–5.11)
RDW: 12.9 % (ref 11.5–15.5)
WBC: 4.9 10*3/uL (ref 4.0–10.5)
nRBC: 0 % (ref 0.0–0.2)

## 2022-12-27 LAB — COMPREHENSIVE METABOLIC PANEL WITH GFR
ALT: 253 U/L — ABNORMAL HIGH (ref 0–44)
AST: 181 U/L — ABNORMAL HIGH (ref 15–41)
Albumin: 4.4 g/dL (ref 3.5–5.0)
Alkaline Phosphatase: 71 U/L (ref 38–126)
Anion gap: 10 (ref 5–15)
BUN: 6 mg/dL (ref 6–20)
CO2: 25 mmol/L (ref 22–32)
Calcium: 9.4 mg/dL (ref 8.9–10.3)
Chloride: 103 mmol/L (ref 98–111)
Creatinine, Ser: 0.65 mg/dL (ref 0.44–1.00)
GFR, Estimated: >60 mL/min
Glucose, Bld: 109 mg/dL — ABNORMAL HIGH (ref 70–99)
Potassium: 3.9 mmol/L (ref 3.5–5.1)
Sodium: 138 mmol/L (ref 135–145)
Total Bilirubin: 0.8 mg/dL (ref 0.3–1.2)
Total Protein: 7.8 g/dL (ref 6.5–8.1)

## 2022-12-27 LAB — URINALYSIS, ROUTINE W REFLEX MICROSCOPIC
Bilirubin Urine: NEGATIVE
Glucose, UA: NEGATIVE mg/dL
Hgb urine dipstick: NEGATIVE
Ketones, ur: NEGATIVE mg/dL
Nitrite: NEGATIVE
Protein, ur: NEGATIVE mg/dL
Specific Gravity, Urine: 1.017 (ref 1.005–1.030)
pH: 5 (ref 5.0–8.0)

## 2022-12-27 LAB — HEMOGLOBIN A1C
Hgb A1c MFr Bld: 6.1 % — ABNORMAL HIGH (ref 4.8–5.6)
Mean Plasma Glucose: 128.37 mg/dL

## 2022-12-27 LAB — PROTIME-INR
INR: 1.1 (ref 0.8–1.2)
Prothrombin Time: 13.9 s (ref 11.4–15.2)

## 2022-12-27 LAB — TYPE AND SCREEN
ABO/RH(D): O POS
Antibody Screen: NEGATIVE

## 2022-12-27 LAB — SURGICAL PCR SCREEN
MRSA, PCR: NEGATIVE
Staphylococcus aureus: NEGATIVE

## 2022-12-27 LAB — APTT: aPTT: 28 s (ref 24–36)

## 2022-12-27 LAB — SARS CORONAVIRUS 2 (TAT 6-24 HRS): SARS Coronavirus 2: NEGATIVE

## 2022-12-27 LAB — GLUCOSE, CAPILLARY: Glucose-Capillary: 152 mg/dL — ABNORMAL HIGH (ref 70–99)

## 2022-12-27 NOTE — Progress Notes (Addendum)
PCP - Gar Ponto Cardiologist - Denies  PPM/ICD - Denies  Chest x-ray - 12/27/22 EKG - 12/27/22 Stress Test - "Years ago" no f/u needed ECHO - Denies Cardiac Cath - Denies  Sleep Study - No  DM - Type II CBG @ PAT appt 152 Fasting Blood Sugar - 130-150's Checks Blood Sugar _3-4____ times a day  Blood Thinner Instructions:Denies Aspirin Instructions:Denies  COVID TEST- 12/27/22   Anesthesia review: Yes abnormal labs   Patient denies shortness of breath, fever, cough and chest pain at PAT appointment   All instructions explained to the patient, with a verbal understanding of the material. Patient agrees to go over the instructions while at home for a better understanding. Patient also instructed to wear a mask while in public after being tested for COVID-19. The opportunity to ask questions was provided.   [place] Dr. Roxan Hockey notified through staff message about the UA result

## 2022-12-28 NOTE — Anesthesia Preprocedure Evaluation (Addendum)
Anesthesia Evaluation  Patient identified by MRN, date of birth, ID band Patient awake    Reviewed: Allergy & Precautions, H&P , NPO status , Patient's Chart, lab work & pertinent test results  History of Anesthesia Complications (+) Family history of anesthesia reaction  Airway Mallampati: II  TM Distance: >3 FB Neck ROM: Full    Dental  (+) Dental Advisory Given, Teeth Intact,    Pulmonary former smoker   Pulmonary exam normal breath sounds clear to auscultation       Cardiovascular negative cardio ROS Normal cardiovascular exam Rhythm:Regular Rate:Normal     Neuro/Psych    GI/Hepatic H/o anal CA s/p resection and ileostomy x56month ago.  Now with SBO.   Endo/Other  diabetes    Renal/GU      Musculoskeletal   Abdominal   Peds  Hematology   Anesthesia Other Findings   Reproductive/Obstetrics                             Anesthesia Physical Anesthesia Plan  ASA: 3  Anesthesia Plan: General   Post-op Pain Management: Tylenol PO (pre-op)* and Gabapentin PO (pre-op)*   Induction: Intravenous  PONV Risk Score and Plan: 3 and Ondansetron, Dexamethasone, Midazolam and Treatment may vary due to age or medical condition  Airway Management Planned: Double Lumen EBT  Additional Equipment: Arterial line  Intra-op Plan:   Post-operative Plan: Possible Post-op intubation/ventilation  Informed Consent: I have reviewed the patients History and Physical, chart, labs and discussed the procedure including the risks, benefits and alternatives for the proposed anesthesia with the patient or authorized representative who has indicated his/her understanding and acceptance.     Dental advisory given  Plan Discussed with: CRNA  Anesthesia Plan Comments: (LFTs noted to be elevated on preop labs. Dr. Dorris Fetch aware and advised okay to proceed, will have pt followup with primary care postop for  reassessment. )        Anesthesia Quick Evaluation

## 2022-12-29 ENCOUNTER — Other Ambulatory Visit: Payer: Self-pay

## 2022-12-29 ENCOUNTER — Inpatient Hospital Stay (HOSPITAL_COMMUNITY): Payer: BC Managed Care – PPO | Admitting: Physician Assistant

## 2022-12-29 ENCOUNTER — Inpatient Hospital Stay (HOSPITAL_COMMUNITY)
Admission: RE | Admit: 2022-12-29 | Discharge: 2023-01-01 | DRG: 164 | Disposition: A | Discharge status: Home / Self Care | Payer: BC Managed Care – PPO | Attending: Thoracic Surgery (Cardiothoracic Vascular Surgery) | Admitting: Thoracic Surgery (Cardiothoracic Vascular Surgery)

## 2022-12-29 ENCOUNTER — Encounter (HOSPITAL_COMMUNITY)
Admission: RE | Disposition: A | Discharge status: Home / Self Care | Payer: Self-pay | Source: Home / Self Care | Attending: Thoracic Surgery (Cardiothoracic Vascular Surgery)

## 2022-12-29 ENCOUNTER — Inpatient Hospital Stay (HOSPITAL_COMMUNITY): Payer: BC Managed Care – PPO

## 2022-12-29 DIAGNOSIS — Z923 Personal history of irradiation: Secondary | ICD-10-CM | POA: Diagnosis not present

## 2022-12-29 DIAGNOSIS — Z881 Allergy status to other antibiotic agents status: Secondary | ICD-10-CM | POA: Diagnosis not present

## 2022-12-29 DIAGNOSIS — C7801 Secondary malignant neoplasm of right lung: Principal | ICD-10-CM | POA: Diagnosis present

## 2022-12-29 DIAGNOSIS — J95812 Postprocedural air leak: Secondary | ICD-10-CM | POA: Diagnosis not present

## 2022-12-29 DIAGNOSIS — R918 Other nonspecific abnormal finding of lung field: Secondary | ICD-10-CM

## 2022-12-29 DIAGNOSIS — Z794 Long term (current) use of insulin: Secondary | ICD-10-CM

## 2022-12-29 DIAGNOSIS — Z902 Acquired absence of lung [part of]: Principal | ICD-10-CM

## 2022-12-29 DIAGNOSIS — Z882 Allergy status to sulfonamides status: Secondary | ICD-10-CM

## 2022-12-29 DIAGNOSIS — D696 Thrombocytopenia, unspecified: Secondary | ICD-10-CM | POA: Diagnosis not present

## 2022-12-29 DIAGNOSIS — Z9221 Personal history of antineoplastic chemotherapy: Secondary | ICD-10-CM

## 2022-12-29 DIAGNOSIS — Z85048 Personal history of other malignant neoplasm of rectum, rectosigmoid junction, and anus: Secondary | ICD-10-CM | POA: Diagnosis not present

## 2022-12-29 DIAGNOSIS — E119 Type 2 diabetes mellitus without complications: Secondary | ICD-10-CM | POA: Diagnosis present

## 2022-12-29 DIAGNOSIS — Z20822 Contact with and (suspected) exposure to covid-19: Secondary | ICD-10-CM | POA: Diagnosis present

## 2022-12-29 DIAGNOSIS — Z933 Colostomy status: Secondary | ICD-10-CM | POA: Diagnosis not present

## 2022-12-29 DIAGNOSIS — Z79899 Other long term (current) drug therapy: Secondary | ICD-10-CM

## 2022-12-29 DIAGNOSIS — Z90710 Acquired absence of both cervix and uterus: Secondary | ICD-10-CM | POA: Diagnosis not present

## 2022-12-29 DIAGNOSIS — Z7984 Long term (current) use of oral hypoglycemic drugs: Secondary | ICD-10-CM | POA: Diagnosis not present

## 2022-12-29 DIAGNOSIS — Z87891 Personal history of nicotine dependence: Secondary | ICD-10-CM

## 2022-12-29 DIAGNOSIS — D62 Acute posthemorrhagic anemia: Secondary | ICD-10-CM | POA: Diagnosis not present

## 2022-12-29 HISTORY — PX: INTERCOSTAL NERVE BLOCK: SHX5021

## 2022-12-29 HISTORY — PX: LYMPH NODE DISSECTION: SHX5087

## 2022-12-29 LAB — BLOOD GAS, ARTERIAL
Acid-Base Excess: 0.7 mmol/L (ref 0.0–2.0)
Bicarbonate: 24.3 mmol/L (ref 20.0–28.0)
O2 Saturation: 99.5 %
Patient temperature: 37
pCO2 arterial: 35 mmHg (ref 32–48)
pH, Arterial: 7.45 (ref 7.35–7.45)
pO2, Arterial: 110 mmHg — ABNORMAL HIGH (ref 83–108)

## 2022-12-29 LAB — GLUCOSE, CAPILLARY
Glucose-Capillary: 139 mg/dL — ABNORMAL HIGH (ref 70–99)
Glucose-Capillary: 173 mg/dL — ABNORMAL HIGH (ref 70–99)
Glucose-Capillary: 162 mg/dL — ABNORMAL HIGH (ref 70–99)
Glucose-Capillary: 157 mg/dL — ABNORMAL HIGH (ref 70–99)
Glucose-Capillary: 222 mg/dL — ABNORMAL HIGH (ref 70–99)
Glucose-Capillary: 147 mg/dL — ABNORMAL HIGH (ref 70–99)

## 2022-12-29 SURGERY — LOBECTOMY, LUNG, ROBOT-ASSISTED, USING VATS
Anesthesia: General | Site: Chest | Laterality: Right

## 2022-12-29 MED ORDER — DEXMEDETOMIDINE HCL IN NACL 80 MCG/20ML IV SOLN
INTRAVENOUS | Status: DC | PRN
  Administered 2022-12-29: 8 ug via BUCCAL
  Administered 2022-12-29 (×2): 12 ug via BUCCAL
  Administered 2022-12-29: 8 ug via BUCCAL

## 2022-12-29 MED ORDER — HYDROMORPHONE HCL 1 MG/ML IJ SOLN
INTRAMUSCULAR | Status: AC
  Filled 2022-12-29: qty 0.5

## 2022-12-29 MED ORDER — KETOROLAC TROMETHAMINE 15 MG/ML IJ SOLN
INTRAMUSCULAR | Status: AC
  Filled 2022-12-29: qty 1

## 2022-12-29 MED ORDER — LACTATED RINGERS IV SOLN: INTRAVENOUS | Status: DC

## 2022-12-29 MED ORDER — OXYCODONE HCL 5 MG PO TABS
5.0000 mg | ORAL_TABLET | ORAL | Status: DC | PRN
  Administered 2022-12-29 – 2022-12-30 (×4): 10 mg via ORAL
  Administered 2022-12-30: 5 mg via ORAL
  Administered 2022-12-31: 10 mg via ORAL
  Administered 2022-12-31: 5 mg via ORAL
  Filled 2022-12-29 (×5): qty 2
  Filled 2022-12-29: qty 1
  Filled 2022-12-29: qty 2

## 2022-12-29 MED ORDER — CEFAZOLIN SODIUM-DEXTROSE 2-4 GM/100ML-% IV SOLN
2.0000 g | INTRAVENOUS | Status: AC
  Administered 2022-12-29: 2 g via INTRAVENOUS
  Filled 2022-12-29: qty 100

## 2022-12-29 MED ORDER — BUPIVACAINE LIPOSOME 1.3 % IJ SUSP
INTRAMUSCULAR | Status: AC
  Filled 2022-12-29: qty 20

## 2022-12-29 MED ORDER — ACETAMINOPHEN 500 MG PO TABS
1000.0000 mg | ORAL_TABLET | Freq: Four times a day (QID) | ORAL | Status: DC
  Administered 2022-12-29 – 2023-01-01 (×12): 1000 mg via ORAL
  Filled 2022-12-29 (×12): qty 2

## 2022-12-29 MED ORDER — INSULIN ASPART 100 UNIT/ML IJ SOLN
0.0000 [IU] | Freq: Four times a day (QID) | INTRAMUSCULAR | Status: DC
  Administered 2022-12-29: 8 [IU] via SUBCUTANEOUS
  Administered 2022-12-29 – 2022-12-30 (×4): 2 [IU] via SUBCUTANEOUS

## 2022-12-29 MED ORDER — LACTATED RINGERS IV SOLN: INTRAVENOUS | Status: DC | PRN

## 2022-12-29 MED ORDER — PROPOFOL 10 MG/ML IV BOLUS
INTRAVENOUS | Status: AC
  Filled 2022-12-29: qty 20

## 2022-12-29 MED ORDER — FENTANYL CITRATE (PF) 250 MCG/5ML IJ SOLN
INTRAMUSCULAR | Status: AC
  Filled 2022-12-29: qty 5

## 2022-12-29 MED ORDER — SENNOSIDES-DOCUSATE SODIUM 8.6-50 MG PO TABS
1.0000 | ORAL_TABLET | Freq: Every day | ORAL | Status: DC
  Administered 2022-12-31: 1 via ORAL
  Filled 2022-12-29: qty 1

## 2022-12-29 MED ORDER — ONDANSETRON HCL 4 MG/2ML IJ SOLN
INTRAMUSCULAR | Status: AC
  Filled 2022-12-29: qty 2

## 2022-12-29 MED ORDER — HYDROXYZINE HCL 25 MG PO TABS
25.0000 mg | ORAL_TABLET | Freq: Every day | ORAL | Status: DC
  Administered 2022-12-30 – 2022-12-31 (×2): 25 mg via ORAL
  Filled 2022-12-29 (×2): qty 1

## 2022-12-29 MED ORDER — ONDANSETRON HCL 4 MG/2ML IJ SOLN
4.0000 mg | Freq: Four times a day (QID) | INTRAMUSCULAR | Status: DC | PRN
  Administered 2022-12-30: 4 mg via INTRAVENOUS
  Filled 2022-12-29 (×2): qty 2

## 2022-12-29 MED ORDER — SODIUM CHLORIDE 0.45 % IV SOLN: INTRAVENOUS | Status: DC

## 2022-12-29 MED ORDER — LUNG SURGERY BOOK
Freq: Once | Status: AC
  Filled 2022-12-29: qty 1

## 2022-12-29 MED ORDER — DEXMEDETOMIDINE HCL IN NACL 80 MCG/20ML IV SOLN
INTRAVENOUS | Status: AC
  Filled 2022-12-29: qty 20

## 2022-12-29 MED ORDER — INSULIN ASPART 100 UNIT/ML IJ SOLN: 0.0000 [IU] | INTRAMUSCULAR | Status: DC | PRN

## 2022-12-29 MED ORDER — HYDROMORPHONE HCL 1 MG/ML IJ SOLN
INTRAMUSCULAR | Status: DC | PRN
  Administered 2022-12-29: .5 mg via INTRAVENOUS

## 2022-12-29 MED ORDER — CHLORHEXIDINE GLUCONATE 0.12 % MT SOLN
15.0000 mL | Freq: Once | OROMUCOSAL | Status: AC
  Administered 2022-12-29: 15 mL via OROMUCOSAL
  Filled 2022-12-29: qty 15

## 2022-12-29 MED ORDER — ACETAMINOPHEN 160 MG/5ML PO SOLN: 1000.0000 mg | Freq: Four times a day (QID) | ORAL | Status: DC

## 2022-12-29 MED ORDER — LIDOCAINE 2% (20 MG/ML) 5 ML SYRINGE
INTRAMUSCULAR | Status: AC
  Filled 2022-12-29: qty 5

## 2022-12-29 MED ORDER — BUPIVACAINE HCL (PF) 0.5 % IJ SOLN
INTRAMUSCULAR | Status: AC
  Filled 2022-12-29: qty 30

## 2022-12-29 MED ORDER — ACETAMINOPHEN 500 MG PO TABS
1000.0000 mg | ORAL_TABLET | Freq: Once | ORAL | Status: AC
  Administered 2022-12-29: 1000 mg via ORAL
  Filled 2022-12-29: qty 2

## 2022-12-29 MED ORDER — SPIRONOLACTONE 100 MG PO TABS
100.0000 mg | ORAL_TABLET | Freq: Every day | ORAL | Status: DC
  Administered 2022-12-30 – 2023-01-01 (×3): 100 mg via ORAL
  Filled 2022-12-29 (×3): qty 1

## 2022-12-29 MED ORDER — CEFAZOLIN SODIUM-DEXTROSE 2-4 GM/100ML-% IV SOLN
2.0000 g | Freq: Three times a day (TID) | INTRAVENOUS | Status: AC
  Administered 2022-12-29 (×2): 2 g via INTRAVENOUS
  Filled 2022-12-29 (×2): qty 100

## 2022-12-29 MED ORDER — KETOROLAC TROMETHAMINE 15 MG/ML IJ SOLN
15.0000 mg | Freq: Four times a day (QID) | INTRAMUSCULAR | Status: AC
  Administered 2022-12-29 – 2022-12-31 (×8): 15 mg via INTRAVENOUS
  Filled 2022-12-29 (×7): qty 1

## 2022-12-29 MED ORDER — MIDAZOLAM HCL 2 MG/2ML IJ SOLN
INTRAMUSCULAR | Status: AC
  Filled 2022-12-29: qty 2

## 2022-12-29 MED ORDER — PANTOPRAZOLE SODIUM 40 MG PO TBEC
40.0000 mg | DELAYED_RELEASE_TABLET | Freq: Every day | ORAL | Status: DC
  Administered 2022-12-30 – 2023-01-01 (×3): 40 mg via ORAL
  Filled 2022-12-29 (×3): qty 1

## 2022-12-29 MED ORDER — MEPERIDINE HCL 25 MG/ML IJ SOLN: 6.2500 mg | INTRAMUSCULAR | Status: DC | PRN

## 2022-12-29 MED ORDER — MIDAZOLAM HCL 2 MG/2ML IJ SOLN
INTRAMUSCULAR | Status: DC | PRN
  Administered 2022-12-29: 2 mg via INTRAVENOUS

## 2022-12-29 MED ORDER — ENOXAPARIN SODIUM 40 MG/0.4ML IJ SOSY
40.0000 mg | PREFILLED_SYRINGE | Freq: Every day | INTRAMUSCULAR | Status: DC
  Administered 2022-12-30 – 2023-01-01 (×3): 40 mg via SUBCUTANEOUS
  Filled 2022-12-29 (×3): qty 0.4

## 2022-12-29 MED ORDER — GLYCOPYRROLATE PF 0.2 MG/ML IJ SOSY
PREFILLED_SYRINGE | INTRAMUSCULAR | Status: DC | PRN
  Administered 2022-12-29: .2 mg via INTRAVENOUS

## 2022-12-29 MED ORDER — FENTANYL CITRATE (PF) 250 MCG/5ML IJ SOLN
INTRAMUSCULAR | Status: DC | PRN
  Administered 2022-12-29: 50 ug via INTRAVENOUS
  Administered 2022-12-29: 100 ug via INTRAVENOUS
  Administered 2022-12-29: 50 ug via INTRAVENOUS
  Administered 2022-12-29: 100 ug via INTRAVENOUS
  Administered 2022-12-29: 50 ug via INTRAVENOUS
  Administered 2022-12-29: 150 ug via INTRAVENOUS

## 2022-12-29 MED ORDER — VALACYCLOVIR HCL 500 MG PO TABS
500.0000 mg | ORAL_TABLET | Freq: Every morning | ORAL | Status: DC
  Administered 2022-12-30 – 2023-01-01 (×3): 500 mg via ORAL
  Filled 2022-12-29 (×3): qty 1

## 2022-12-29 MED ORDER — SODIUM CHLORIDE 0.9% IV SOLUTION
INTRAVENOUS | Status: AC | PRN
  Administered 2022-12-29: 1000 mL via INTRAMUSCULAR

## 2022-12-29 MED ORDER — PROMETHAZINE HCL 25 MG/ML IJ SOLN: 6.2500 mg | INTRAMUSCULAR | Status: DC | PRN

## 2022-12-29 MED ORDER — ORAL CARE MOUTH RINSE: 15.0000 mL | Freq: Once | OROMUCOSAL | Status: AC

## 2022-12-29 MED ORDER — BUPIVACAINE LIPOSOME 1.3 % IJ SUSP
INTRAMUSCULAR | Status: DC | PRN
  Administered 2022-12-29: 100 mL

## 2022-12-29 MED ORDER — LORATADINE 10 MG PO TABS
10.0000 mg | ORAL_TABLET | Freq: Every day | ORAL | Status: DC
  Administered 2022-12-30 – 2023-01-01 (×3): 10 mg via ORAL
  Filled 2022-12-29 (×3): qty 1

## 2022-12-29 MED ORDER — AMISULPRIDE (ANTIEMETIC) 5 MG/2ML IV SOLN: 10.0000 mg | Freq: Once | INTRAVENOUS | Status: DC | PRN

## 2022-12-29 MED ORDER — DEXAMETHASONE SODIUM PHOSPHATE 10 MG/ML IJ SOLN
INTRAMUSCULAR | Status: DC | PRN
  Administered 2022-12-29: 10 mg via INTRAVENOUS

## 2022-12-29 MED ORDER — LIDOCAINE 2% (20 MG/ML) 5 ML SYRINGE
INTRAMUSCULAR | Status: DC | PRN
  Administered 2022-12-29: 35 mg via INTRAVENOUS

## 2022-12-29 MED ORDER — HYDROMORPHONE HCL 1 MG/ML IJ SOLN
INTRAMUSCULAR | Status: AC
  Filled 2022-12-29: qty 1

## 2022-12-29 MED ORDER — GABAPENTIN 300 MG PO CAPS
300.0000 mg | ORAL_CAPSULE | Freq: Once | ORAL | Status: DC
  Filled 2022-12-29: qty 1

## 2022-12-29 MED ORDER — GABAPENTIN 300 MG PO CAPS
300.0000 mg | ORAL_CAPSULE | Freq: Every day | ORAL | Status: AC
  Administered 2022-12-29 – 2022-12-31 (×3): 300 mg via ORAL
  Filled 2022-12-29 (×3): qty 1

## 2022-12-29 MED ORDER — MORPHINE SULFATE (PF) 2 MG/ML IV SOLN: 2.0000 mg | INTRAVENOUS | Status: DC | PRN

## 2022-12-29 MED ORDER — BISACODYL 5 MG PO TBEC
10.0000 mg | DELAYED_RELEASE_TABLET | Freq: Every day | ORAL | Status: DC
  Administered 2022-12-30 – 2023-01-01 (×3): 10 mg via ORAL
  Filled 2022-12-29 (×3): qty 2

## 2022-12-29 MED ORDER — ROCURONIUM BROMIDE 100 MG/10ML IV SOLN: INTRAVENOUS | Status: DC | PRN

## 2022-12-29 MED ORDER — PHENYLEPHRINE 80 MCG/ML (10ML) SYRINGE FOR IV PUSH (FOR BLOOD PRESSURE SUPPORT)
PREFILLED_SYRINGE | INTRAVENOUS | Status: DC | PRN
  Administered 2022-12-29: 40 ug via INTRAVENOUS

## 2022-12-29 MED ORDER — ROCURONIUM BROMIDE 10 MG/ML (PF) SYRINGE
PREFILLED_SYRINGE | INTRAVENOUS | Status: DC | PRN
  Administered 2022-12-29: 10 mg via INTRAVENOUS
  Administered 2022-12-29: 50 mg via INTRAVENOUS
  Administered 2022-12-29: 80 mg via INTRAVENOUS

## 2022-12-29 MED ORDER — PROPOFOL 10 MG/ML IV BOLUS
INTRAVENOUS | Status: DC | PRN
  Administered 2022-12-29 (×2): 50 mg via INTRAVENOUS
  Administered 2022-12-29: 10 mg via INTRAVENOUS
  Administered 2022-12-29: 50 mg via INTRAVENOUS
  Administered 2022-12-29: 150 mg via INTRAVENOUS
  Administered 2022-12-29: 50 mg via INTRAVENOUS
  Administered 2022-12-29 (×2): 10 mg via INTRAVENOUS
  Administered 2022-12-29: 50 mg via INTRAVENOUS

## 2022-12-29 MED ORDER — DEXAMETHASONE SODIUM PHOSPHATE 10 MG/ML IJ SOLN
INTRAMUSCULAR | Status: AC
  Filled 2022-12-29: qty 1

## 2022-12-29 MED ORDER — ONDANSETRON HCL 4 MG/2ML IJ SOLN
INTRAMUSCULAR | Status: DC | PRN
  Administered 2022-12-29: 4 mg via INTRAVENOUS

## 2022-12-29 MED ORDER — HYDROMORPHONE HCL 1 MG/ML IJ SOLN
0.2500 mg | INTRAMUSCULAR | Status: DC | PRN
  Administered 2022-12-29 (×2): 0.5 mg via INTRAVENOUS

## 2022-12-29 MED ORDER — LIDOCAINE HCL (CARDIAC) PF 100 MG/5ML IV SOSY: PREFILLED_SYRINGE | INTRAVENOUS | Status: DC | PRN

## 2022-12-29 MED ORDER — 0.9 % SODIUM CHLORIDE (POUR BTL) OPTIME
TOPICAL | Status: DC | PRN
  Administered 2022-12-29 (×2): 1000 mL

## 2022-12-29 MED ORDER — ROCURONIUM BROMIDE 10 MG/ML (PF) SYRINGE
PREFILLED_SYRINGE | INTRAVENOUS | Status: AC
  Filled 2022-12-29: qty 20

## 2022-12-29 MED ORDER — SUGAMMADEX SODIUM 200 MG/2ML IV SOLN
INTRAVENOUS | Status: DC | PRN
  Administered 2022-12-29: 200 mg via INTRAVENOUS

## 2022-12-29 MED ORDER — HEMOSTATIC AGENTS (NO CHARGE) OPTIME
TOPICAL | Status: DC | PRN
  Administered 2022-12-29: 1 via TOPICAL

## 2022-12-29 MED ORDER — GABAPENTIN 300 MG PO CAPS: 300.0000 mg | ORAL_CAPSULE | Freq: Two times a day (BID) | ORAL | Status: DC

## 2022-12-29 SURGICAL SUPPLY — 94 items
ADH SKN CLS APL DERMABOND .7 (GAUZE/BANDAGES/DRESSINGS) ×2
BAG SPEC RTRVL C1550 15 (MISCELLANEOUS) ×2
BLADE CLIPPER SURG (BLADE) IMPLANT
CANISTER SUCT 3000ML PPV (MISCELLANEOUS) ×6 IMPLANT
CANNULA REDUC XI 12-8 STAPL (CANNULA) ×4
CANNULA REDUCER 12-8 DVNC XI (CANNULA) ×6 IMPLANT
CNTNR URN SCR LID CUP LEK RST (MISCELLANEOUS) ×15 IMPLANT
CONN ST 1/4X3/8  BEN (MISCELLANEOUS)
CONN ST 1/4X3/8 BEN (MISCELLANEOUS) IMPLANT
CONT SPEC 4OZ STRL OR WHT (MISCELLANEOUS) ×30
DEFOGGER SCOPE WARMER CLEARIFY (MISCELLANEOUS) ×3 IMPLANT
DERMABOND ADVANCED .7 DNX12 (GAUZE/BANDAGES/DRESSINGS) ×3 IMPLANT
DRAIN CHANNEL 28F RND 3/8 FF (WOUND CARE) IMPLANT
DRAIN CHANNEL 32F RND 10.7 FF (WOUND CARE) IMPLANT
DRAPE ARM DVNC X/XI (DISPOSABLE) ×12 IMPLANT
DRAPE COLUMN DVNC XI (DISPOSABLE) ×3 IMPLANT
DRAPE CV SPLIT W-CLR ANES SCRN (DRAPES) ×3 IMPLANT
DRAPE DA VINCI XI ARM (DISPOSABLE) ×8
DRAPE DA VINCI XI COLUMN (DISPOSABLE) ×2
DRAPE HALF SHEET 40X57 (DRAPES) ×3 IMPLANT
DRAPE INCISE IOBAN 66X45 STRL (DRAPES) IMPLANT
DRAPE ORTHO SPLIT 77X108 STRL (DRAPES) ×2
DRAPE SURG ORHT 6 SPLT 77X108 (DRAPES) ×3 IMPLANT
ELECT BLADE 6.5 EXT (BLADE) ×3 IMPLANT
ELECT REM PT RETURN 9FT ADLT (ELECTROSURGICAL) ×2
ELECTRODE REM PT RTRN 9FT ADLT (ELECTROSURGICAL) ×3 IMPLANT
GAUZE KITTNER 4X5 RF (MISCELLANEOUS) ×6 IMPLANT
GAUZE SPONGE 4X4 12PLY STRL (GAUZE/BANDAGES/DRESSINGS) ×3 IMPLANT
GAUZE SPONGE 4X4 12PLY STRL LF (GAUZE/BANDAGES/DRESSINGS) IMPLANT
GLOVE SS BIOGEL STRL SZ 7.5 (GLOVE) ×6 IMPLANT
GLOVE SURG POLYISO LF SZ8 (GLOVE) ×3 IMPLANT
GOWN STRL REUS W/ TWL LRG LVL3 (GOWN DISPOSABLE) ×6 IMPLANT
GOWN STRL REUS W/ TWL XL LVL3 (GOWN DISPOSABLE) ×6 IMPLANT
GOWN STRL REUS W/TWL 2XL LVL3 (GOWN DISPOSABLE) ×3 IMPLANT
GOWN STRL REUS W/TWL LRG LVL3 (GOWN DISPOSABLE) ×4
GOWN STRL REUS W/TWL XL LVL3 (GOWN DISPOSABLE) ×4
HEMOSTAT SURGICEL 2X14 (HEMOSTASIS) ×9 IMPLANT
IRRIGATION STRYKERFLOW (MISCELLANEOUS) ×3 IMPLANT
IRRIGATOR STRYKERFLOW (MISCELLANEOUS) ×2
KIT BASIN OR (CUSTOM PROCEDURE TRAY) ×3 IMPLANT
KIT SUCTION CATH 14FR (SUCTIONS) IMPLANT
KIT TURNOVER KIT B (KITS) ×3 IMPLANT
NDL HYPO 25GX1X1/2 BEV (NEEDLE) ×3 IMPLANT
NDL SPNL 22GX3.5 QUINCKE BK (NEEDLE) ×3 IMPLANT
NEEDLE HYPO 25GX1X1/2 BEV (NEEDLE) ×2 IMPLANT
NEEDLE SPNL 22GX3.5 QUINCKE BK (NEEDLE) ×2 IMPLANT
NS IRRIG 1000ML POUR BTL (IV SOLUTION) ×6 IMPLANT
PACK CHEST (CUSTOM PROCEDURE TRAY) ×3 IMPLANT
PAD ARMBOARD 7.5X6 YLW CONV (MISCELLANEOUS) ×6 IMPLANT
RELOAD STAPLE 45 2.5 WHT DVNC (STAPLE) IMPLANT
RELOAD STAPLE 45 3.5 BLU DVNC (STAPLE) IMPLANT
RELOAD STAPLE 45 4.3 GRN DVNC (STAPLE) IMPLANT
RELOAD STAPLER 2.5X45 WHT DVNC (STAPLE) ×6 IMPLANT
RELOAD STAPLER 3.5X45 BLU DVNC (STAPLE) ×10 IMPLANT
RELOAD STAPLER 4.3X45 GRN DVNC (STAPLE) ×6 IMPLANT
SCISSORS LAP 5X35 DISP (ENDOMECHANICALS) IMPLANT
SEAL CANN UNIV 5-8 DVNC XI (MISCELLANEOUS) ×6 IMPLANT
SEAL XI 5MM-8MM UNIVERSAL (MISCELLANEOUS) ×4
SET TRI-LUMEN FLTR TB AIRSEAL (TUBING) ×3 IMPLANT
SOLUTION ELECTROLUBE (MISCELLANEOUS) ×3 IMPLANT
SPONGE INTESTINAL PEANUT (DISPOSABLE) IMPLANT
SPONGE TONSIL TAPE 1 RFD (DISPOSABLE) IMPLANT
STAPLER 45 SUREFORM CVD (STAPLE) ×2
STAPLER 45 SUREFORM CVD DVNC (STAPLE) IMPLANT
STAPLER CANNULA SEAL DVNC XI (STAPLE) ×6 IMPLANT
STAPLER CANNULA SEAL XI (STAPLE) ×4
STAPLER RELOAD 2.5X45 WHITE (STAPLE) ×6
STAPLER RELOAD 2.5X45 WHT DVNC (STAPLE) ×6
STAPLER RELOAD 3.5X45 BLU DVNC (STAPLE) ×10
STAPLER RELOAD 3.5X45 BLUE (STAPLE) ×10
STAPLER RELOAD 4.3X45 GREEN (STAPLE) ×6
STAPLER RELOAD 4.3X45 GRN DVNC (STAPLE) ×6
SUT PROLENE 4 0 RB 1 (SUTURE)
SUT PROLENE 4-0 RB1 .5 CRCL 36 (SUTURE) IMPLANT
SUT SILK  1 MH (SUTURE) ×2
SUT SILK 1 MH (SUTURE) ×3 IMPLANT
SUT SILK 2 0 SH (SUTURE) ×3 IMPLANT
SUT SILK 2 0SH CR/8 30 (SUTURE) IMPLANT
SUT SILK 3 0SH CR/8 30 (SUTURE) IMPLANT
SUT VIC AB 1 CTX 36 (SUTURE) ×2
SUT VIC AB 1 CTX36XBRD ANBCTR (SUTURE) ×3 IMPLANT
SUT VIC AB 2-0 CTX 36 (SUTURE) ×3 IMPLANT
SUT VIC AB 3-0 X1 27 (SUTURE) ×6 IMPLANT
SUT VICRYL 0 TIES 12 18 (SUTURE) ×3 IMPLANT
SUT VICRYL 0 UR6 27IN ABS (SUTURE) ×6 IMPLANT
SUT VICRYL 2 TP 1 (SUTURE) IMPLANT
SYR 20CC LL (SYRINGE) ×6 IMPLANT
SYSTEM RETRIEVAL ANCHOR 15 (MISCELLANEOUS) IMPLANT
SYSTEM SAHARA CHEST DRAIN ATS (WOUND CARE) ×3 IMPLANT
TAPE CLOTH 4X10 WHT NS (GAUZE/BANDAGES/DRESSINGS) ×3 IMPLANT
TOWEL GREEN STERILE (TOWEL DISPOSABLE) ×3 IMPLANT
TRAY FOLEY MTR SLVR 16FR STAT (SET/KITS/TRAYS/PACK) ×3 IMPLANT
TRAY FOLEY W/BAG SLVR 14FR (SET/KITS/TRAYS/PACK) IMPLANT
WATER STERILE IRR 1000ML POUR (IV SOLUTION) ×6 IMPLANT

## 2022-12-29 NOTE — Interval H&P Note (Signed)
History and Physical Interval Note:  12/29/2022 7:17 AM  Beth [name]  has presented today for surgery, with the diagnosis of RLL LUNG MASS.  The various methods of treatment have been discussed with the patient and family. After consideration of risks, benefits and other options for treatment, the patient has consented to  Procedure(s): XI ROBOTIC ASSISTED THORACOSCOPY-RIGHT LOWER LOBECTOMY (Right) as a surgical intervention.  The patient's history has been reviewed, patient examined, no change in status, stable for surgery.  I have reviewed the patient's chart and labs.  Questions were answered to the patient's satisfaction.     Loreli Slot

## 2022-12-29 NOTE — Transfer of Care (Signed)
Immediate Anesthesia Transfer of Care Note  Patient: Beth [name]  Procedure(s) Performed: XI ROBOTIC ASSISTED THORACOSCOPY-RIGHT LOWER LOBECTOMY (Right: Chest) LYMPH NODE DISSECTION (Right: Chest) INTERCOSTAL NERVE BLOCK (Chest)  Patient Location: PACU  Anesthesia Type:General  Level of Consciousness: drowsy, patient cooperative, and responds to stimulation  Airway & Oxygen Therapy: Patient Spontanous Breathing and Patient connected to nasal cannula oxygen  Post-op Assessment: Report given to RN and Post -op Vital signs reviewed and stable  Post vital signs: Reviewed and stable  Last Vitals:  Vitals Value Taken Time  BP 131/100 12/29/22 1108  Temp    Pulse 80 12/29/22 1111  Resp 10 12/29/22 1111  SpO2 98 % 12/29/22 1111  Vitals shown include unvalidated device data.  Last Pain:  Vitals:   12/29/22 0622  TempSrc:   PainSc: 0-No pain         Complications: No notable events documented.

## 2022-12-29 NOTE — Hospital Course (Signed)
HPI:  Beth [name] is sent for consultation regarding a right lower lobe lung mass.   Beth [name] is a 52 year old woman with a history of tobacco abuse, anal cancer, and migraines.  She was diagnosed with anal cancer in 2021.  She was treated with chemo and radiation.  She ultimately ended up having a AP resection with end colostomy and a rectus flap to the perineum.  Recently she had a routine follow-up CT which showed a new 2.5 x 2.6 cm mass in the right lower lobe.   She does have a history of tobacco use.  She smoked about a pack a day for 25 years prior to quitting 2 years ago.   She denies any chest pain, pressure, tightness, shortness of breath, cough, wheezing, or hemoptysis.  She is gained some weight recently.  No problems with appetite.  No unusual headaches or visual changes.   Hospital Course:

## 2022-12-29 NOTE — Brief Op Note (Addendum)
12/29/2022  11:00 AM  PATIENT:  Beth [name]  52 y.o. female  PRE-OPERATIVE DIAGNOSIS:  RLL LUNG MASS  POST-OPERATIVE DIAGNOSIS:  RLL LUNG MASS  PROCEDURE:  XI ROBOTIC ASSISTED THORACOSCOPY-RIGHT LOWER LOBECTOMY (Right) LYMPH NODE DISSECTION (Right) INTERCOSTAL NERVE BLOCK  SURGEON:  Surgeon(s) and Role:  Loreli Slot, MD - Primary  PHYSICIAN ASSISTANT: Aloha Gell PA-C  ASSISTANTS: none   ANESTHESIA:   local and general  EBL:  50 mL   BLOOD ADMINISTERED:none  DRAINS:  Right pleural tube    LOCAL MEDICATIONS USED:  OTHER Exparel  SPECIMEN:  Source of Specimen:  Right lower lobectomy and lymph nodes  DISPOSITION OF SPECIMEN:  PATHOLOGY  COUNTS CORRECT:  YES  DICTATION: .Dragon Dictation  PLAN OF CARE: Admit to inpatient   PATIENT DISPOSITION:  PACU - hemodynamically stable.   Delay start of Pharmacological VTE agent (>24hrs) due to surgical blood loss or risk of bleeding: no

## 2022-12-29 NOTE — Anesthesia Postprocedure Evaluation (Signed)
Anesthesia Post Note  Patient: Beth [name]  Procedure(s) Performed: XI ROBOTIC ASSISTED THORACOSCOPY-RIGHT LOWER LOBECTOMY (Right: Chest) LYMPH NODE DISSECTION (Right: Chest) INTERCOSTAL NERVE BLOCK (Chest)     Patient location during evaluation: PACU Anesthesia Type: General Level of consciousness: sedated and patient cooperative Pain management: pain level controlled Vital Signs Assessment: post-procedure vital signs reviewed and stable Respiratory status: spontaneous breathing Cardiovascular status: stable Anesthetic complications: no   No notable events documented.  Last Vitals:  Vitals:   12/29/22 1330 12/29/22 1401  BP: 127/86 129/80  Pulse: 71 77  Resp: 12 15  Temp: 36.7 C 36.6 C  SpO2: 100% 100%    Last Pain:  Vitals:   12/29/22 1401  TempSrc: Oral  PainSc:                  Lewie Loron

## 2022-12-29 NOTE — Anesthesia Procedure Notes (Signed)
Procedure Name: Intubation Date/Time: 12/29/2022 7:46 AM  Performed by: Betha Loa, CRNAPre-anesthesia Checklist: Patient identified, Emergency Drugs available, Suction available and Patient being monitored Patient Re-evaluated:Patient Re-evaluated prior to induction Oxygen Delivery Method: Circle System Utilized Preoxygenation: Pre-oxygenation with 100% oxygen Induction Type: IV induction Ventilation: Mask ventilation without difficulty Laryngoscope Size: Mac, 3 and Glidescope Grade View: Grade I Tube type: Oral Endobronchial tube: Left, EBT position confirmed by auscultation, Double lumen EBT and EBT position confirmed by fiberoptic bronchoscope and 35 Fr Number of attempts: 1 Airway Equipment and Method: Stylet and Oral airway Placement Confirmation: ETT inserted through vocal cords under direct vision, positive ETCO2 and breath sounds checked- equal and bilateral Tube secured with: Tape Dental Injury: Teeth and Oropharynx as per pre-operative assessment  Comments: Smooth IV induction, DL with MAC3 per CRNA-G2 view, unable to advance 37 EBT via glottic opening x 2 attempts.  DL x3 per Dr Milagros Loll with MAC3-G2 view, unable to advance 37 EBT via glottic opeining x 2 attempts and Glidescope MAC3 x1 attempt.  MAC3 with G1 view per Dr Lissa Hoard, able to advance 41 EBT for successful intubation.

## 2022-12-29 NOTE — Op Note (Signed)
Beth [name], Beth [name] MEDICAL RECORD NO: [id number] ACCOUNT NO: [id number] DATE OF BIRTH: September 18, 1971 FACILITY: [facility] LOCATION: [facility] PHYSICIAN: Revonda Standard. Roxan Hockey, MD  Operative Report   DATE OF PROCEDURE: 12/29/2022  PREOPERATIVE DIAGNOSIS:  Right lower lobe lung mass.  POSTOPERATIVE DIAGNOSIS:  Right lower lobe lung mass.  PROCEDURE:   Xi robotic-assisted right lower lobectomy,  Lymph node dissection and  Intercostal nerve blocks levels 3 through 10.  SURGEON:  Revonda Standard. Roxan Hockey, MD  ASSISTANT:  Wynelle Beckmann, PA  ANESTHESIA:  General.  FINDINGS:  Mass in right lower lobe.  Frozen section- indeterminate, but suspicious for metastatic squamous cell carcinoma.  Bronchial margin negative for tumor.  CLINICAL NOTE:  Beth [name] is a 52 year old woman with a history of smoking and anal cancer.  She recently had a followup CT, which showed a new 2.5 x 2.6 cm mass in the right lower lobe.  On PET scan the mass was markedly hypermetabolic with no evidence of  disease elsewhere.  She was offered surgical resection. The indications, risks, benefits, and alternatives were discussed in detail with the patient.  She understood and accepted the risks and agreed to proceed.  OPERATIVE NOTE:  Beth [name] was brought to the preoperative holding area on 12/29/2022.  Anesthesia placed an arterial blood pressure monitoring line and established intravenous access.  She was taken to the operating room and anesthetized and intubated with a double lumen endotracheal tube.  Intravenous antibiotics were administered.  A Foley catheter was placed.  Sequential compression devices were placed on the calves for DVT prophylaxis.  She was placed in a left lateral decubitus position and the right chest was prepped and draped in the usual sterile fashion.  A Bair Hugger was placed for active warming.  Single lung ventilation of the left lung was initiated and was tolerated well throughout the procedure.  A  timeout was performed.  A solution containing 20 mL of liposomal bupivacaine, 30 mL of 0.5% bupivacaine and 50 mL of saline was used for nerve blocks and as well as local at the incision sites.  A timeout was performed.  An incision was made in the eighth interspace in approximately the midaxillary line.  An 8 mm robotic port was inserted.  After confirming intrapleural placement, carbon dioxide was insufflated per protocol.  A 12 mm robotic port was placed in the eighth interspace anterior to the camera port.  Intercostal nerve blocks then were performed from the third to the tenth interspace by injecting 10 mL of the bupivacaine solution into a subpleural plane at each level.  A 12 mm AirSeal port was placed in the tenth interspace  posterolaterally.  Two additional eighth interspace robotic ports were placed.  The robot was deployed.  The camera arm was docked, targeting was performed.  The remaining arms were docked.  Robotic instruments were inserted with thoracoscopic visualization.  There was good isolation of the right lung.  Dissection was begun at the inferior pulmonary ligament with bipolar cautery.  No level 9 node was identified.  The pleural reflection was divided at the hilum posteriorly.  Level 7 nodes were identified and removed as well as a level 11 node at the bifurcation of the right upper lobe bronchus from the bronchus intermedius.  Working further superiorly, the pleural reflection was divided at the hilum between the azygos vein and the pulmonary vessels.  A level 10 node was removed and then dissecting further superiorly a level 4R node was removed. Pleural reflection was then  divided at the hilum anteriorly, the phrenic nerve was identified and no cautery was used in its vicinity.  Next, the fissure was inspected.  The pulmonary artery was identifiable in the fissure between the middle and lower lobes and the pleura overlying it was incised. Plane was developed and nodes along the  pulmonary artery were removed.  The fissure was completed anteriorly with a single firing of the robotic stapler.  Dissection then proceeded from anterior to posterior along the pulmonary artery, the basilar segmental branch had already been dissected out. The superior segmental branch was identified and then  the fissure was completed, working from anterior to posterior between the superior segment and the right upper lobe.  This required multiple firings of the robotic stapler.  The basilar segmental pulmonary artery then was carefully encircled and divided with the robotic stapler followed by the superior segmental branch.  The lung then was retracted superiorly and the inferior pulmonary vein was divided.  Finally, the stapler was placed across the right lower lobe bronchus at its origin.  Care was taken not  to occlude the middle lobe bronchus.  A test inflation showed good aeration of the upper and middle lobes.  The stapler was fired transecting the bronchus.  The sponges and vessel loop that had been placed during the dissection were removed.  The chest was copiously irrigated with warm saline.  A test inflation to 30 cm of water revealed no evidence of leakage from the bronchial stump.  The robotic instruments were removed.  The robot was undocked.  The anterior eighth interspace incision was extended to ~3 cm.  A 15 mm endoscopic retrieval bag was placed into the chest.  The lower lobe was advanced into the bag, which was then removed and sent for frozen section on the mass and the bronchial margin.  The bronchial margin was free of tumor.  The mass was malignant, but could not differentiate between a high-grade neuroendocrine versus metastatic disease.  A 28-French Blake drain was placed through the original port incision and secured with a 0 silk suture.  Dual lung ventilation was resumed.  The remaining incisions were closed in standard fashion.  Dermabond was applied.  The chest tube was placed to a  Pleur-Evac on waterseal.  The patient was placed back in a supine position.  She was extubated in the operating room and taken to the  postanesthetic care unit in good condition.  All sponge, needle and instrument counts were correct at the end of the procedure.  Experienced assistance was necessary for this case due to surgical complexity.  Aloha Gell, PA served as the Geophysicist/field seismologist providing assistance with port placement, robot docking and undocking, instrument exchange, specimen retrieval, suctioning, and wound closure.   PUS D: 12/29/2022 1:14:16 pm T: 12/29/2022 4:02:00 pm  JOB: [id number]/ [id number]

## 2022-12-29 NOTE — Anesthesia Procedure Notes (Signed)
Arterial Line Insertion Start/End1/11/2023 7:10 AM, 12/29/2022 7:12 AM Performed by: Lonia Mad, CRNA, CRNA  Patient location: Pre-op. Preanesthetic checklist: patient identified, IV checked, site marked, risks and benefits discussed, surgical consent, monitors and equipment checked, pre-op evaluation, timeout performed and anesthesia consent Lidocaine 1% used for infiltration Left, radial was placed Catheter size: 20 G Hand hygiene performed  and maximum sterile barriers used   Attempts: 1 Procedure performed without using ultrasound guided technique. Following insertion, dressing applied and Biopatch. Post procedure assessment: normal  Patient tolerated the procedure well with no immediate complications.

## 2022-12-30 ENCOUNTER — Encounter (HOSPITAL_COMMUNITY): Payer: Self-pay | Admitting: Thoracic Surgery (Cardiothoracic Vascular Surgery)

## 2022-12-30 ENCOUNTER — Inpatient Hospital Stay (HOSPITAL_COMMUNITY): Payer: BC Managed Care – PPO

## 2022-12-30 LAB — BASIC METABOLIC PANEL
Anion gap: 8 (ref 5–15)
BUN: 11 mg/dL (ref 6–20)
CO2: 23 mmol/L (ref 22–32)
Calcium: 8.5 mg/dL — ABNORMAL LOW (ref 8.9–10.3)
Chloride: 103 mmol/L (ref 98–111)
Creatinine, Ser: 0.76 mg/dL (ref 0.44–1.00)
GFR, Estimated: >60 mL/min (ref >60)
Glucose, Bld: 179 mg/dL — ABNORMAL HIGH (ref 70–99)
Potassium: 4 mmol/L (ref 3.5–5.1)
Sodium: 134 mmol/L — ABNORMAL LOW (ref 135–145)

## 2022-12-30 LAB — CBC
HCT: 35.1 % — ABNORMAL LOW (ref 36.0–46.0)
Hemoglobin: 11.7 g/dL — ABNORMAL LOW (ref 12.0–15.0)
MCH: 33.6 pg (ref 26.0–34.0)
MCHC: 33.3 g/dL (ref 30.0–36.0)
MCV: 100.9 fL — ABNORMAL HIGH (ref 80.0–100.0)
Platelets: 108 10*3/uL — ABNORMAL LOW (ref 150–400)
RBC: 3.48 MIL/uL — ABNORMAL LOW (ref 3.87–5.11)
RDW: 12.7 % (ref 11.5–15.5)
WBC: 9.8 10*3/uL (ref 4.0–10.5)
nRBC: 0 % (ref 0.0–0.2)

## 2022-12-30 LAB — GLUCOSE, CAPILLARY
Glucose-Capillary: 153 mg/dL — ABNORMAL HIGH (ref 70–99)
Glucose-Capillary: 139 mg/dL — ABNORMAL HIGH (ref 70–99)
Glucose-Capillary: 130 mg/dL — ABNORMAL HIGH (ref 70–99)
Glucose-Capillary: 139 mg/dL — ABNORMAL HIGH (ref 70–99)

## 2022-12-30 MED ORDER — INSULIN ASPART 100 UNIT/ML IJ SOLN
0.0000 [IU] | Freq: Three times a day (TID) | INTRAMUSCULAR | Status: DC
  Administered 2022-12-31 (×3): 4 [IU] via SUBCUTANEOUS
  Administered 2023-01-01: 3 [IU] via SUBCUTANEOUS

## 2022-12-30 MED ORDER — INSULIN ASPART 100 UNIT/ML IJ SOLN: 0.0000 [IU] | Freq: Every day | INTRAMUSCULAR | Status: DC

## 2022-12-30 MED ORDER — INSULIN ASPART 100 UNIT/ML IJ SOLN: 0.0000 [IU] | Freq: Four times a day (QID) | INTRAMUSCULAR | Status: DC

## 2022-12-30 NOTE — Progress Notes (Signed)
Mobility Specialist Progress Note:   12/30/22 1103  Mobility  Activity Ambulated with assistance in hallway  Level of Assistance Standby assist, set-up cues, supervision of patient - no hands on  Assistive Device Front wheel walker  Distance Ambulated (ft) 150 ft  Activity Response Tolerated well  Mobility Referral Yes  $Mobility charge 1 Mobility   Pt received in chair and requesting ambulation. No complaints. Pt left in bed with all needs met, call bell in reach, and bed alarm on.   Daine Gravel Mobility Specialist Please contact via SecureChat or  Rehab office at [phone]

## 2022-12-30 NOTE — Progress Notes (Signed)
[address]       [name] [address]             [phone]     1 Day Post-Op Procedure(s) (LRB): XI ROBOTIC ASSISTED THORACOSCOPY-RIGHT LOWER LOBECTOMY (Right) LYMPH NODE DISSECTION (Right) INTERCOSTAL NERVE BLOCK Subjective: Feels ok, some pain c/o but not out of ordinary  Objective: Vital signs in last 24 hours: Temp:  [97.8 F (36.6 C)-98.2 F (36.8 C)] 98 F (36.7 C) (01/13 0700) Pulse Rate:  [61-89] 83 (01/13 0700) Cardiac Rhythm: Normal sinus rhythm (01/13 0816) Resp:  [9-24] 24 (01/13 0700) BP: (92-153)/(61-100) 118/75 (01/13 0700) SpO2:  [90 %-100 %] 97 % (01/13 0700) Arterial Line BP: (118-152)/(63-87) 132/69 (01/12 1245)  Hemodynamic parameters for last 24 hours:    Intake/Output from previous day: 01/12 0701 - 01/13 0700 In: 3143.4 [P.O.:240; I.V.:2903.4] Out: 2595 [Urine:2330; Blood:50; Chest Tube:215] Intake/Output this shift: Total I/O In: 360 [P.O.:360] Out: -   General appearance: alert, cooperative, and no distress Heart: regular rate and rhythm Lungs: Coarse in lower fields, no wheeze Abdomen: Benign Extremities: Warm and well-perfused, no edema Wound: Patient is healing well  Lab Results: Recent Labs    12/30/22 0006  WBC 9.8  HGB 11.7*  HCT 35.1*  PLT 108*   BMET:  Recent Labs    12/30/22 0006  NA 134*  K 4.0  CL 103  CO2 23  GLUCOSE 179*  BUN 11  CREATININE 0.76  CALCIUM 8.5*    PT/INR: No results for input(s): "LABPROT", "INR" in the last 72 hours. ABG    Component Value Date/Time   PHART 7.45 12/29/2022 0715   HCO3 24.3 12/29/2022 0715   TCO2 24 02/25/2021 0934   O2SAT 99.5 12/29/2022 0715   CBG (last 3)  Recent Labs    12/29/22 1910 12/29/22 2318 12/30/22 0553  GLUCAP 222* 147* 153*    Meds Scheduled Meds:  acetaminophen  1,000 mg Oral Q6H   Or   acetaminophen (TYLENOL) oral liquid 160 mg/5 mL  1,000 mg Oral Q6H   bisacodyl  10 mg Oral Daily   enoxaparin (LOVENOX) injection   40 mg Subcutaneous Daily   gabapentin  300 mg Oral QHS   Followed by   Melene Muller ON 01/01/2023] gabapentin  300 mg Oral BID   hydrOXYzine  25 mg Oral QHS   insulin aspart  0-24 Units Subcutaneous Q6H   ketorolac  15 mg Intravenous Q6H   loratadine  10 mg Oral Daily   pantoprazole  40 mg Oral Daily   senna-docusate  1 tablet Oral QHS   spironolactone  100 mg Oral Q1500   valACYclovir  500 mg Oral q AM   Continuous Infusions:  sodium chloride 100 mL/hr at 12/30/22 0115   PRN Meds:.morphine injection, ondansetron (ZOFRAN) IV, oxyCODONE  Xrays DG Chest Port 1 View  Result Date: 12/30/2022 CLINICAL DATA:  Lobectomy follow up EXAM: PORTABLE CHEST 1 VIEW COMPARISON:  12/29/2018 FINDINGS: Small right apical pneumothorax. Right apical chest tube. Vague alveolar density left lower lung could represent early pneumonia. Lungs are otherwise clear. No pleural effusion identified. Normal pulmonary vasculature. IMPRESSION: Small right apical pneumothorax. Possible early left basilar consolidation. Electronically Signed   By: Layla Maw M.D.   On: 12/30/2022 08:50   DG Chest Port 1 View  Result Date: 12/29/2022 CLINICAL DATA:  Status post right lower lobectomy EXAM: PORTABLE CHEST 1 VIEW COMPARISON:  Chest radiograph dated 12/27/2022 FINDINGS: Lines/tubes: Interval placement of right  apical pleural drainage catheter. Chest: Patient is rotated slightly to the right. Focal density along the medial right lung base likely reflects postsurgical changes. Pleura: No definite pneumothorax or pleural effusion. Heart/mediastinum: The heart size and mediastinal contours are within normal limits. Bones: No acute osseous abnormality. IMPRESSION: 1. Interval placement of right apical pleural drainage catheter status post right lower lobectomy. No definite pneumothorax. 2. Focal density along the medial right lung base likely reflects postsurgical changes. Electronically Signed   By: Agustin Cree M.D.   On: 12/29/2022 11:45     Assessment/Plan: S/P Procedure(s) (LRB): XI ROBOTIC ASSISTED THORACOSCOPY-RIGHT LOWER LOBECTOMY (Right) LYMPH NODE DISSECTION (Right) INTERCOSTAL NERVE BLOCK POD #1  1 afebrile, vital signs stable, systolic blood pressure 92-153 range, sinus rhythm 2 oxygen saturations are good on 2 L nasal cannula 3 chest tube 215 cc/postop, small airleak, will leave chest tube for now 4 excellent urine output 5 chest x-ray-tiny right apical pneumothorax 6 normal renal function 7 very minor expected acute blood loss anemia 8 thrombocytopenia, platelet count 108 K monitor 9 no leukocytosis 10 Lovenox for DVT PPx, may need to DC if platelet count decreases further, repeat labs and chest x-ray in morning 11 continue routine pulmonary hygiene and rehab modalities   LOS: 1 day    Rowe Clack [facility] Pager [phone]  12/30/2022

## 2022-12-31 ENCOUNTER — Inpatient Hospital Stay (HOSPITAL_COMMUNITY): Payer: BC Managed Care – PPO

## 2022-12-31 LAB — COMPREHENSIVE METABOLIC PANEL
ALT: 84 U/L — ABNORMAL HIGH (ref 0–44)
AST: 52 U/L — ABNORMAL HIGH (ref 15–41)
Albumin: 3.5 g/dL (ref 3.5–5.0)
Alkaline Phosphatase: 59 U/L (ref 38–126)
Anion gap: 9 (ref 5–15)
BUN: 11 mg/dL (ref 6–20)
CO2: 21 mmol/L — ABNORMAL LOW (ref 22–32)
Calcium: 8.4 mg/dL — ABNORMAL LOW (ref 8.9–10.3)
Chloride: 105 mmol/L (ref 98–111)
Creatinine, Ser: 0.76 mg/dL (ref 0.44–1.00)
GFR, Estimated: >60 mL/min (ref >60)
Glucose, Bld: 169 mg/dL — ABNORMAL HIGH (ref 70–99)
Potassium: 3.7 mmol/L (ref 3.5–5.1)
Sodium: 135 mmol/L (ref 135–145)
Total Bilirubin: 1 mg/dL (ref 0.3–1.2)
Total Protein: 6.3 g/dL — ABNORMAL LOW (ref 6.5–8.1)

## 2022-12-31 LAB — CBC
HCT: 40.2 % (ref 36.0–46.0)
Hemoglobin: 13.3 g/dL (ref 12.0–15.0)
MCH: 34.6 pg — ABNORMAL HIGH (ref 26.0–34.0)
MCHC: 33.1 g/dL (ref 30.0–36.0)
MCV: 104.7 fL — ABNORMAL HIGH (ref 80.0–100.0)
Platelets: 129 10*3/uL — ABNORMAL LOW (ref 150–400)
RBC: 3.84 MIL/uL — ABNORMAL LOW (ref 3.87–5.11)
RDW: 13.2 % (ref 11.5–15.5)
WBC: 8.3 10*3/uL (ref 4.0–10.5)
nRBC: 0 % (ref 0.0–0.2)

## 2022-12-31 LAB — GLUCOSE, CAPILLARY
Glucose-Capillary: 164 mg/dL — ABNORMAL HIGH (ref 70–99)
Glucose-Capillary: 170 mg/dL — ABNORMAL HIGH (ref 70–99)
Glucose-Capillary: 164 mg/dL — ABNORMAL HIGH (ref 70–99)
Glucose-Capillary: 128 mg/dL — ABNORMAL HIGH (ref 70–99)

## 2022-12-31 NOTE — Progress Notes (Signed)
Mobility Specialist Progress Note    12/31/22 1446  Mobility  Activity Ambulated independently in hallway  Level of Assistance Standby assist, set-up cues, supervision of patient - no hands on  Assistive Device None  Distance Ambulated (ft) 420 ft  Activity Response Tolerated well  Mobility Referral Yes  $Mobility charge 1 Mobility   Pre-Mobility: 92 HR, 94% SpO2 Post-Mobility: 97 HR  Pt received in bed and agreeable. No complaints on walk. Returned to [facility].   [place]on Nation Mobility Specialist  Please Neurosurgeon or Rehab Office at [phone]

## 2022-12-31 NOTE — Progress Notes (Addendum)
[address]       [name] [address]             [phone]     2 Days Post-Op Procedure(s) (LRB): XI ROBOTIC ASSISTED THORACOSCOPY-RIGHT LOWER LOBECTOMY (Right) LYMPH NODE DISSECTION (Right) INTERCOSTAL NERVE BLOCK Subjective: Some issues related to pain.  Has not walked today yet, some mild nausea overall she feels as though she is making progress  Objective: Vital signs in last 24 hours: Temp:  [98 F (36.7 C)-98.2 F (36.8 C)] 98.2 F (36.8 C) (01/14 0741) Pulse Rate:  [70-86] 86 (01/14 0741) Cardiac Rhythm: Normal sinus rhythm (01/14 0748) Resp:  [11-23] 23 (01/14 0741) BP: (102-157)/(67-87) 107/75 (01/14 0741) SpO2:  [94 %-97 %] 94 % (01/14 0741)  Hemodynamic parameters for last 24 hours:    Intake/Output from previous day: 01/13 0701 - 01/14 0700 In: 1080 [P.O.:1080] Out: 100 [Chest Tube:100] Intake/Output this shift: No intake/output data recorded.  General appearance: alert, cooperative, and no distress Heart: regular rate and rhythm Lungs: Right basilar crackles Abdomen: Benign exam Extremities: Warm and well-perfused, no edema, no calf tenderness Wound: Incision healing well  Lab Results: Recent Labs    12/30/22 0006 12/31/22 0020  WBC 9.8 8.3  HGB 11.7* 13.3  HCT 35.1* 40.2  PLT 108* 129*   BMET:  Recent Labs    12/30/22 0006 12/31/22 0020  NA 134* 135  K 4.0 3.7  CL 103 105  CO2 23 21*  GLUCOSE 179* 169*  BUN 11 11  CREATININE 0.76 0.76  CALCIUM 8.5* 8.4*    PT/INR: No results for input(s): "LABPROT", "INR" in the last 72 hours. ABG    Component Value Date/Time   PHART 7.45 12/29/2022 0715   HCO3 24.3 12/29/2022 0715   TCO2 24 02/25/2021 0934   O2SAT 99.5 12/29/2022 0715   CBG (last 3)  Recent Labs    12/30/22 1633 12/30/22 2246 12/31/22 0557  GLUCAP 130* 139* 164*    Meds Scheduled Meds:  acetaminophen  1,000 mg Oral Q6H   Or   acetaminophen (TYLENOL) oral liquid 160 mg/5 mL  1,000 mg Oral  Q6H   bisacodyl  10 mg Oral Daily   enoxaparin (LOVENOX) injection  40 mg Subcutaneous Daily   gabapentin  300 mg Oral QHS   Followed by   Melene Muller ON 01/01/2023] gabapentin  300 mg Oral BID   hydrOXYzine  25 mg Oral QHS   insulin aspart  0-20 Units Subcutaneous TID WC   insulin aspart  0-5 Units Subcutaneous QHS   loratadine  10 mg Oral Daily   pantoprazole  40 mg Oral Daily   senna-docusate  1 tablet Oral QHS   spironolactone  100 mg Oral Q1500   valACYclovir  500 mg Oral q AM   Continuous Infusions:  sodium chloride 100 mL/hr at 12/31/22 0615   PRN Meds:.morphine injection, ondansetron (ZOFRAN) IV, oxyCODONE  Xrays DG Chest 1 View  Result Date: 12/31/2022 CLINICAL DATA:  Status post lobectomy EXAM: CHEST  1 VIEW COMPARISON:  Prior chest x-ray 12/30/2022 FINDINGS: Right-sided chest tube remains in unchanged position. Trace right apical pneumothorax. Mild subcutaneous emphysema along the right chest wall. Surgical changes in the region of the right hilum. Minimal bibasilar atelectasis. Cardiac and mediastinal contours are normal. No acute osseous abnormality. IMPRESSION: 1. Trace right apical pneumothorax with chest tube in place. 2. Bibasilar atelectasis. Electronically Signed   By: Malachy Moan M.D.   On: 12/31/2022 07:25  DG Chest Port 1 View  Result Date: 12/30/2022 CLINICAL DATA:  Lobectomy follow up EXAM: PORTABLE CHEST 1 VIEW COMPARISON:  12/29/2018 FINDINGS: Small right apical pneumothorax. Right apical chest tube. Vague alveolar density left lower lung could represent early pneumonia. Lungs are otherwise clear. No pleural effusion identified. Normal pulmonary vasculature. IMPRESSION: Small right apical pneumothorax. Possible early left basilar consolidation. Electronically Signed   By: Layla Maw M.D.   On: 12/30/2022 08:50   DG Chest Port 1 View  Result Date: 12/29/2022 CLINICAL DATA:  Status post right lower lobectomy EXAM: PORTABLE CHEST 1 VIEW COMPARISON:  Chest  radiograph dated 12/27/2022 FINDINGS: Lines/tubes: Interval placement of right apical pleural drainage catheter. Chest: Patient is rotated slightly to the right. Focal density along the medial right lung base likely reflects postsurgical changes. Pleura: No definite pneumothorax or pleural effusion. Heart/mediastinum: The heart size and mediastinal contours are within normal limits. Bones: No acute osseous abnormality. IMPRESSION: 1. Interval placement of right apical pleural drainage catheter status post right lower lobectomy. No definite pneumothorax. 2. Focal density along the medial right lung base likely reflects postsurgical changes. Electronically Signed   By: Agustin Cree M.D.   On: 12/29/2022 11:45    Assessment/Plan: S/P Procedure(s) (LRB): XI ROBOTIC ASSISTED THORACOSCOPY-RIGHT LOWER LOBECTOMY (Right) LYMPH NODE DISSECTION (Right) INTERCOSTAL NERVE BLOCK  POD #2  1 afebrile, SBP 102-157, sinus rhythm 2 O2 sats good on room air 3 chest tube 100 cc/24H-no airleak, will removed today 4 voiding-not all measured 5 blood sugars adequately controlled on sliding scale-will resume home meds at discharge 6 normal renal function 7 minor elevation of LFTs, improved from previous on 12/27/2022 8 chest x-ray-trace apical pneumothorax on right 9 thrombocytopenia with improved trend, platelet count 129 K today, not anemic, no leukocytosis 10 routine pulmonary hygiene and activity progression-possible home in a.m. if no new issues    LOS: 2 days    Rowe Clack PA-C Pager [phone] 12/31/2022 Agree with above

## 2022-12-31 NOTE — Discharge Summary (Addendum)
Physician Discharge Summary       [address]       [place] [address]             [phone]    Patient ID: Beth [name] MRN: [id number] DOB/AGE: 53/27/1972 53 y.o.  Admit date: 12/29/2022 Discharge date: 01/01/2023  Admission Diagnoses: Right lower lobe lung mass History of squamous cell carcinoma the anal canal   Discharge Diagnoses:  Metastatic squamous cell carcinoma of the anal canal to right lower lobe of lung. Principal Problem:   S/P lobectomy of lung   Consults: None  Procedure (s):  Xi robotic-assisted right lower lobectomy, lymph node dissection and intercostal nerve blocks levels 3 through 10 by Dr. Roxan Hockey on 12/29/2022.  Pathology: FINAL MICROSCOPIC DIAGNOSIS:   A. LUNG, RIGHT LOWER, LOBECTOMY:  Poorly differentiated carcinoma consistent with metastasis (see comment)  Tumor measures 4.8 x 2.9 x 2.6 cm  Margins free  One benign lymph node, negative for carcinoma (0/1)   B. LYMPH NODE, LEVEL 7, EXCISION:  One benign lymph node, negative for carcinoma (0/1)   C. LYMPH NODE, LEVEL 11, EXCISION:  One benign lymph node, negative for carcinoma (0/1)   D. LYMPH NODE, LEVEL 7#2, EXCISION:  One benign lymph node, negative for carcinoma (0/1)   E. LYMPH NODE, LEVEL 10, EXCISION:  One benign lymph node, negative for carcinoma (0/1)   F. LYMPH NODE, LEVEL 4R, EXCISION:  One benign lymph node, negative for carcinoma (0/1)   G. LYMPH NODE, LEVEL 10#2, EXCISION:  One benign lymph node, negative for carcinoma (0/1)    HPI:  [name] is sent for consultation regarding a right lower lobe lung mass.   Beth [name] is a 52 year old woman with a history of tobacco abuse, anal cancer, and migraines.  She was diagnosed with anal cancer in 2021.  She was treated with chemo and radiation.  She ultimately ended up having a AP resection with end colostomy and a rectus flap to the perineum.  Recently she had a routine follow-up CT which  showed a new 2.5 x 2.6 cm mass in the right lower lobe.   She does have a history of tobacco use.  She smoked about a pack a day for 25 years prior to quitting 2 years ago.   She denies any chest pain, pressure, tightness, shortness of breath, cough, wheezing, or hemoptysis.  She is gained some weight recently.  No problems with appetite.  No unusual headaches or visual changes.   Hospital Course: Patient was admitted and underwent robotic assisted right lower lobectomy with lymph node dissection and intercostal nerve block on 12/29/2022.  Postoperative hospital course: Patient has done well.  On postoperative day #1 she did have a air leak and chest tube was left in place.  It resolved by postoperative day #2 and tube was removed.  She has been somewhat slow in terms of increasing activity level but it is overall steady.  Oxygen has been weaned and she maintains good saturations on room air.  Incisions are noted to be healing well without evidence of infection.  She is tolerating diet.  She does have minor postoperative thrombocytopenia but the trend is improving.  Maintains normal renal function and is not anemic.  Blood sugars have been under adequate control and she will resume home medications at discharge. She did have intermittent nausea, but no emesis. She was given IV Reglan.  All wounds are clean, dry, healing without signs of infection. She has  been ambulating on room air with good oxygenation. Patient ate breakfast and lunch today. She had brief nausea after ambulating but is now feeling better. She wants to go home. She will be given a prescription for Zofran ODT PRN. Discharge instructions reviewed with patient. She is felt surgically stable for discharge today.    Latest Vital Signs: Blood pressure (!) 151/93, pulse 91, temperature 98.3 F (36.8 C), temperature source Oral, resp. rate 20, height 5\' 8"  (1.727 m), weight 78.9 kg, last menstrual period 03/13/2012, SpO2 96 %.  Physical  Exam: Cardiovascular: RRR Pulmonary: Clear to auscultation bilaterally Abdomen: Soft, non tender, bowel sounds present. Extremities: No calf tenderness  Wounds: Clean and dry.  No erythema or signs of infection.  Discharge Condition:Stable  Recent laboratory studies:  Lab Results  Component Value Date   WBC 8.3 12/31/2022   HGB 13.3 12/31/2022   HCT 40.2 12/31/2022   MCV 104.7 (H) 12/31/2022   PLT 129 (L) 12/31/2022   Lab Results  Component Value Date   NA 135 12/31/2022   K 3.7 12/31/2022   CL 105 12/31/2022   CO2 21 (L) 12/31/2022   CREATININE 0.76 12/31/2022   GLUCOSE 169 (H) 12/31/2022      Diagnostic Studies: DG Chest 2 View  Result Date: 01/01/2023 CLINICAL DATA:  Surgery follow-up. History of right lower lobectomy. EXAM: CHEST - 2 VIEW COMPARISON:  12/31/2022 FINDINGS: Right chest tube has been removed. The small right apical pneumothorax has minimally changed. Elevation of the right hemidiaphragm is compatible with volume loss and right lower lobectomy. Small patchy densities at the left lung base are suggestive for atelectasis. Heart size is stable. The trachea remains midline. Difficult to exclude trace pleural fluid particularly at the left lung base. Persistent subcutaneous gas in the right lower chest. IMPRESSION: 1. Removal of right chest tube. Minimal change in the small right apical pneumothorax. 2. Left basilar atelectasis. 3. Possible trace left pleural fluid. Electronically Signed   By: 01/02/2023 M.D.   On: 01/01/2023 08:17   DG Chest 1 View  Result Date: 12/31/2022 CLINICAL DATA:  Status post lobectomy EXAM: CHEST  1 VIEW COMPARISON:  Prior chest x-ray 12/30/2022 FINDINGS: Right-sided chest tube remains in unchanged position. Trace right apical pneumothorax. Mild subcutaneous emphysema along the right chest wall. Surgical changes in the region of the right hilum. Minimal bibasilar atelectasis. Cardiac and mediastinal contours are normal. No acute osseous  abnormality. IMPRESSION: 1. Trace right apical pneumothorax with chest tube in place. 2. Bibasilar atelectasis. Electronically Signed   By: 01/01/2023 M.D.   On: 12/31/2022 07:25   DG Chest Port 1 View  Result Date: 12/30/2022 CLINICAL DATA:  Lobectomy follow up EXAM: PORTABLE CHEST 1 VIEW COMPARISON:  12/29/2018 FINDINGS: Small right apical pneumothorax. Right apical chest tube. Vague alveolar density left lower lung could represent early pneumonia. Lungs are otherwise clear. No pleural effusion identified. Normal pulmonary vasculature. IMPRESSION: Small right apical pneumothorax. Possible early left basilar consolidation. Electronically Signed   By: 02/27/2019 M.D.   On: 12/30/2022 08:50   DG Chest Port 1 View  Result Date: 12/29/2022 CLINICAL DATA:  Status post right lower lobectomy EXAM: PORTABLE CHEST 1 VIEW COMPARISON:  Chest radiograph dated 12/27/2022 FINDINGS: Lines/tubes: Interval placement of right apical pleural drainage catheter. Chest: Patient is rotated slightly to the right. Focal density along the medial right lung base likely reflects postsurgical changes. Pleura: No definite pneumothorax or pleural effusion. Heart/mediastinum: The heart size and mediastinal contours are within  normal limits. Bones: No acute osseous abnormality. IMPRESSION: 1. Interval placement of right apical pleural drainage catheter status post right lower lobectomy. No definite pneumothorax. 2. Focal density along the medial right lung base likely reflects postsurgical changes. Electronically Signed   By: Agustin Cree M.D.   On: 12/29/2022 11:45   DG Chest 2 View  Result Date: 12/27/2022 CLINICAL DATA:  Preop evaluation for right lung mass. EXAM: CHEST - 2 VIEW COMPARISON:  10/07/2018, chest CT 12/04/2022 FINDINGS: Lungs are adequately inflated demonstrate evidence of patient's known right lung mass over the lower lobe medially. Remainder of the lungs are clear. Cardiomediastinal silhouette and remainder  of the exam is unchanged. IMPRESSION: 1. No acute cardiopulmonary disease. 2. Known 3 cm right lower lobe mass. Electronically Signed   By: Elberta Fortis M.D.   On: 12/27/2022 16:49   NM PET Image Restag (PS) Skull Base To Thigh  Result Date: 12/22/2022 CLINICAL DATA:  Initial treatment strategy for anal cancer with new lung nodule. EXAM: NUCLEAR MEDICINE PET SKULL BASE TO THIGH TECHNIQUE: 8.34 mCi F-18 FDG was injected intravenously. Full-ring PET imaging was performed from the skull base to thigh after the radiotracer. CT data was obtained and used for attenuation correction and anatomic localization. Fasting blood glucose: 119 mg/dl COMPARISON:  86/73/0520 FINDINGS: Mediastinal blood pool activity: SUV max 2.73 Liver activity: SUV max NA NECK: No hypermetabolic lymph nodes in the neck. Incidental CT findings: None. CHEST: Within the right lower lobe there is a 3 cm FDG avid lung nodule with SUV max of 8.39, image 57/7. On 12/04/2022 this measured 2.6 cm. This is new when compared with the remote CT from 12/07/2021. No tracer avid axillary, mediastinal, or hilar lymph nodes. Incidental CT findings: Coronary artery calcifications. Mild aortic atherosclerotic classifications. ABDOMEN/PELVIS: No abnormal hypermetabolic activity within the liver, pancreas, adrenal glands, or spleen. No hypermetabolic lymph nodes in the abdomen or pelvis. Incidental CT findings: Status post APR with left abdominal sigmoid end colostomy noted. Post treatment changes with presacral soft tissue thickening is noted. Aortic atherosclerotic calcifications. No ascites. SKELETON: No focal hypermetabolic activity to suggest skeletal metastasis. Incidental CT findings: None. IMPRESSION: 1. The right lower lobe lung nodule is FDG avid concerning for malignancy. Differential considerations include solitary pulmonary metastasis versus primary bronchogenic carcinoma 2. No signs of FDG avid nodal or solid organ metastasis within the abdomen or  pelvis. 3. Status post APR with left abdominal sigmoid end colostomy. 4. Aortic Atherosclerosis (ICD10-I70.0). Coronary artery calcifications. Electronically Signed   By: Signa Kell M.D.   On: 12/22/2022 11:20   CT CHEST ABDOMEN PELVIS W CONTRAST  Result Date: 12/04/2022 CLINICAL DATA:  Anal cancer restaging.  * Tracking Code: BO * EXAM: CT CHEST, ABDOMEN, AND PELVIS WITH CONTRAST TECHNIQUE: Multidetector CT imaging of the chest, abdomen and pelvis was performed following the standard protocol during bolus administration of intravenous contrast. RADIATION DOSE REDUCTION: This exam was performed according to the departmental dose-optimization program which includes automated exposure control, adjustment of the mA and/or kV according to patient size and/or use of iterative reconstruction technique. CONTRAST:  10mL OMNIPAQUE IOHEXOL 300 MG/ML  SOLN COMPARISON:  Chest CT 12/07/2021.  Abdomen and pelvis CT 12/03/2021. FINDINGS: CT CHEST FINDINGS Cardiovascular: The heart size is normal. No substantial pericardial effusion. Coronary artery calcification is evident. No thoracic aortic aneurysm. Mediastinum/Nodes: No mediastinal lymphadenopathy. There is no hilar lymphadenopathy. The esophagus has normal imaging features. There is no axillary lymphadenopathy. Lungs/Pleura: 2.6 x 2.5 cm right lower lobe pulmonary  nodule on image 107/series 4 is new in the interval. No other suspicious new pulmonary nodule or mass. No focal airspace consolidation. There is no evidence of pleural effusion. Musculoskeletal: No worrisome lytic or sclerotic osseous abnormality. CT ABDOMEN PELVIS FINDINGS Hepatobiliary: No suspicious focal abnormality within the liver parenchyma. There is no evidence for gallstones, gallbladder wall thickening, or pericholecystic fluid. No intrahepatic or extrahepatic biliary dilation. Pancreas: No focal mass lesion. No dilatation of the main duct. No intraparenchymal cyst. No peripancreatic edema.  Spleen: No splenomegaly. No focal mass lesion. Adrenals/Urinary Tract: No adrenal nodule or mass. Kidneys unremarkable. No evidence for hydroureter. The urinary bladder appears normal for the degree of distention. Stomach/Bowel: Stomach is unremarkable. No gastric wall thickening. No evidence of outlet obstruction. Duodenum is normally positioned as is the ligament of Treitz. No small bowel wall thickening. No small bowel dilatation. The terminal ileum is normal. The appendix is not well visualized, but there is no edema or inflammation in the region of the cecum. Status post AVR. No gross colonic mass. No colonic wall thickening. Left abdominal sigmoid end colostomy noted. Transverse colon takes a low coarsened is tethered posteriorly to the presacral space. Vascular/Lymphatic: There is mild atherosclerotic calcification of the abdominal aorta without aneurysm. There is no gastrohepatic or hepatoduodenal ligament lymphadenopathy. No retroperitoneal or mesenteric lymphadenopathy. No pelvic sidewall lymphadenopathy. Reproductive: Hysterectomy There is no adnexal mass. Other: No intraperitoneal free fluid. Surgical changes are noted in the low pelvis and presacral space. Musculoskeletal: No worrisome lytic or sclerotic osseous abnormality. IMPRESSION: 1. 2.6 x 2.5 cm right lower lobe pulmonary nodule, new in the interval. Imaging features are consistent with solitary metastatic disease to the lung. 2. No evidence for metastatic disease in the abdomen or pelvis. 3. Status post APR with left abdominal sigmoid end colostomy. Low course of the transverse colon with tethering posteriorly into the presacral space. These results will be called to the ordering clinician or representative by the Radiologist Assistant, and communication documented in the PACS or [employer]. Electronically Signed   By: [name] M.D.   On: 12/04/2022 11:16         Discharge Medications: Allergies as of 01/01/2023        Reactions   Clindamycin/lincomycin Rash   Sulfa Drugs Cross Reactors Itching, Rash        Medication List     TAKE these medications    acetaminophen 500 MG tablet Commonly known as: TYLENOL Take 1,000 mg by mouth every 6 (six) hours as needed (for pain.).   cetirizine 10 MG tablet Commonly known as: ZYRTEC Take 10 mg by mouth in the morning.   clobetasol ointment 0.05 % Commonly known as: TEMOVATE Apply 1 Application topically at bedtime. Apply to palms at bedtime and cover with gloves   docusate sodium 100 MG capsule Commonly known as: COLACE Take 100 mg by mouth daily as needed for mild constipation (in the afternoon for regularity).   gabapentin 600 MG tablet Commonly known as: NEURONTIN Take 600 mg by mouth in the morning, at noon, and at bedtime. Morning, afternoon & night   glipiZIDE 2.5 MG 24 hr tablet Commonly known as: GLUCOTROL XL Take 2.5 mg by mouth every morning.   hydrOXYzine 25 MG tablet Commonly known as: ATARAX Take 25 mg by mouth at bedtime.   ibuprofen 200 MG tablet Commonly known as: ADVIL Take 400 mg by mouth every 8 (eight) hours as needed (for pain.).   ondansetron 4 MG disintegrating tablet Commonly known  as: ZOFRAN-ODT Take 1 tablet (4 mg total) by mouth every 8 (eight) hours as needed for nausea or vomiting.   oxyCODONE 5 MG immediate release tablet Commonly known as: Oxy IR/ROXICODONE Take 1 tablet (5 mg total) by mouth every 6 (six) hours as needed for severe pain.   polyethylene glycol 17 g packet Commonly known as: MIRALAX / GLYCOLAX Take 17 g by mouth in the morning.   Semglee (yfgn) 100 UNIT/ML Pen Generic drug: insulin glargine-yfgn Inject 8 Units into the skin every evening.   spironolactone 100 MG tablet Commonly known as: ALDACTONE Take 100 mg by mouth daily in the afternoon.   triamcinolone cream 0.5 % Commonly known as: KENALOG Apply 1 Application topically 2 (two) times daily as needed (skin irritation.).    valACYclovir 500 MG tablet Commonly known as: VALTREX Take 500 mg by mouth in the morning.        Follow Up Appointments:  Follow-up Information     Loreli Slot, MD. Go on 01/09/2023.   Specialty: Cardiothoracic Surgery Why: PA/LAT CXR to be taken (at [facility] which is in the same building as Dr. Sunday Corn office) on 01/23 at;Appointment time is at 1:45 pm Contact information: [address] [employer] [address] [place] [address] [phone]                 Signed: Lelon Huh [facility] 01/01/2023, 2:24 PM

## 2023-01-01 ENCOUNTER — Inpatient Hospital Stay (HOSPITAL_COMMUNITY): Payer: BC Managed Care – PPO

## 2023-01-01 LAB — GLUCOSE, CAPILLARY
Glucose-Capillary: 108 mg/dL — ABNORMAL HIGH (ref 70–99)
Glucose-Capillary: 130 mg/dL — ABNORMAL HIGH (ref 70–99)

## 2023-01-01 MED ORDER — METOCLOPRAMIDE HCL 5 MG/ML IJ SOLN
10.0000 mg | Freq: Four times a day (QID) | INTRAMUSCULAR | Status: DC
  Administered 2023-01-01: 10 mg via INTRAVENOUS
  Filled 2023-01-01: qty 2

## 2023-01-01 MED ORDER — ONDANSETRON 4 MG PO TBDP: 4.0000 mg | ORAL_TABLET | Freq: Three times a day (TID) | ORAL | 0 refills | Status: DC | PRN

## 2023-01-01 MED ORDER — OXYCODONE HCL 5 MG PO TABS: 5.0000 mg | ORAL_TABLET | Freq: Four times a day (QID) | ORAL | 0 refills | Status: DC | PRN

## 2023-01-01 NOTE — Discharge Instructions (Signed)
[facility] [phone]    Robot-Assisted Thoracic Surgery, Care After The following information offers guidance on how to care for yourself after your procedure. Your health care provider may also give you more specific instructions. If you have problems or questions, contact your health care provider. What can I expect after the procedure? After the procedure, it is common to have: Some pain and aches in the area of your surgical incisions. Pain when breathing in (inhaling) and coughing. Tiredness (fatigue). Trouble sleeping. Constipation. Follow these instructions at home: Medicines Take over-the-counter and prescription medicines only as told by your health care provider. If you were prescribed an antibiotic medicine, take it as told by your health care provider. Do not stop taking the antibiotic even if you start to feel better. Talk with your health care provider about safe and effective ways to manage pain after your procedure. Pain management should fit your specific health needs. Take pain medicine before pain becomes severe. Relieving and controlling your pain will make breathing easier for you. Ask your health care provider if the medicine prescribed to you requires you to avoid driving or using machinery. Eating and drinking Follow instructions from your health care provider about eating or drinking restrictions. These will vary depending on what procedure you had. Your health care provider may recommend: A liquid diet or soft diet for the first few days. Meals that are smaller and more frequent. A diet of fruits, vegetables, whole grains, and low-fat proteins. Limiting foods that are high in fat and processed sugar, including fried or sweet foods. Incision care Follow instructions from your health care provider about how to take care of your incisions. Make sure you: Wash your hands with soap and water for at least 20 seconds before and after you change  your bandage (dressing). If soap and water are not available, use hand sanitizer. Change your dressing as told by your health care provider. Leave stitches (sutures), skin glue, or adhesive strips in place. These skin closures may need to stay in place for 2 weeks or longer. If adhesive strip edges start to loosen and curl up, you may trim the loose edges. Do not remove adhesive strips completely unless your health care provider tells you to do that. Check your incision area every day for signs of infection. Check for: Redness, swelling, or more pain. Fluid or blood. Warmth. Pus or a bad smell. Activity Return to your normal activities as told by your health care provider. Ask your health care provider what activities are safe for you. Ask your health care provider when it is safe for you to drive. Do not lift anything that is heavier than 10 lb (4.5 kg), or the limit that you are told, until your health care provider says that it is safe. Rest as told by your health care provider. Avoid sitting for a long time without moving. Get up to take short walks every 1-2 hours. This is important to improve blood flow and breathing. Ask for help if you feel weak or unsteady. Do exercises as told by your health care provider. Pneumonia prevention  Do deep breathing exercises and cough regularly as directed. This helps clear mucus and opens your lungs. Doing this helps prevent lung infection (pneumonia). If you were given an incentive spirometer, use it as told. An incentive spirometer is a tool that measures how well you are filling your lungs with each breath. Coughing may hurt less if you try to support your chest. This  is called splinting. Try one of these when you cough: Hold a pillow against your chest. Place the palms of both hands on top of your incision area. Do not use any products that contain nicotine or tobacco. These products include cigarettes, chewing tobacco, and vaping devices, such as  e-cigarettes. If you need help quitting, ask your health care provider. Avoid secondhand smoke. General instructions If you have a drainage tube: Follow instructions from your health care provider about how to take care of it. Do not travel by airplane after your tube is removed until your health care provider tells you it is safe. You may need to take these actions to prevent or treat constipation: Drink enough fluid to keep your urine pale yellow. Take over-the-counter or prescription medicines. Eat foods that are high in fiber, such as beans, whole grains, and fresh fruits and vegetables. Limit foods that are high in fat and processed sugars, such as fried or sweet foods. Keep all follow-up visits. This is important. Contact a health care provider if: You have redness, swelling, or more pain around an incision. You have fluid or blood coming from an incision. An incision feels warm to the touch. You have pus or a bad smell coming from an incision. You have a fever. You cannot eat or drink without vomiting. Your pain medicine is not controlling your pain. Get help right away if: You have chest pain. Your heart is beating quickly. You have trouble breathing. You have trouble speaking. You are confused. You feel weak or dizzy, or you faint. These symptoms may represent a serious problem that is an emergency. Do not wait to see if the symptoms will go away. Get medical help right away. Call your local emergency services (911 in the [place] Do not drive yourself to the hospital. Summary Talk with your health care provider about safe and effective ways to manage pain after your procedure. Pain management should fit your specific health needs. Return to your normal activities as told by your health care provider. Ask your health care provider what activities are safe for you. Do deep breathing exercises and cough regularly as directed. This helps to clear mucus and prevent pneumonia. If it  hurts to cough, ease pain by holding a pillow against your chest or by placing the palms of both hands over your incisions. This information is not intended to replace advice given to you by your health care provider. Make sure you discuss any questions you have with your health care provider. Document Revised: 08/27/2020 Document Reviewed: 08/27/2020 Elsevier Patient Education  2023 [employer].

## 2023-01-01 NOTE — Plan of Care (Signed)

## 2023-01-01 NOTE — Plan of Care (Signed)
  Problem: Education: Goal: Knowledge of disease or condition will improve Outcome: Adequate for Discharge Goal: Knowledge of the prescribed therapeutic regimen will improve Outcome: Adequate for Discharge   Problem: Activity: Goal: Risk for activity intolerance will decrease Outcome: Adequate for Discharge   Problem: Cardiac: Goal: Will achieve and/or maintain hemodynamic stability Outcome: Adequate for Discharge   Problem: Clinical Measurements: Goal: Postoperative complications will be avoided or minimized Outcome: Adequate for Discharge   Problem: Respiratory: Goal: Respiratory status will improve Outcome: Adequate for Discharge   Problem: Pain Management: Goal: Pain level will decrease Outcome: Adequate for Discharge   Problem: Skin Integrity: Goal: Wound healing without signs and symptoms infection will improve Outcome: Adequate for Discharge   Problem: Education: Goal: Knowledge of General Education information will improve Description: Including pain rating scale, medication(s)/side effects and non-pharmacologic comfort measures Outcome: Adequate for Discharge   Problem: Health Behavior/Discharge Planning: Goal: Ability to manage health-related needs will improve Outcome: Adequate for Discharge   Problem: Clinical Measurements: Goal: Ability to maintain clinical measurements within normal limits will improve Outcome: Adequate for Discharge Goal: Will remain free from infection Outcome: Adequate for Discharge Goal: Diagnostic test results will improve Outcome: Adequate for Discharge Goal: Respiratory complications will improve Outcome: Adequate for Discharge Goal: Cardiovascular complication will be avoided Outcome: Adequate for Discharge   Problem: Activity: Goal: Risk for activity intolerance will decrease Outcome: Adequate for Discharge   Problem: Nutrition: Goal: Adequate nutrition will be maintained Outcome: Adequate for Discharge   Problem:  Coping: Goal: Level of anxiety will decrease Outcome: Adequate for Discharge   Problem: Elimination: Goal: Will not experience complications related to bowel motility Outcome: Adequate for Discharge Goal: Will not experience complications related to urinary retention Outcome: Adequate for Discharge   Problem: Pain Managment: Goal: General experience of comfort will improve Outcome: Adequate for Discharge   Problem: Safety: Goal: Ability to remain free from injury will improve Outcome: Adequate for Discharge   Problem: Skin Integrity: Goal: Risk for impaired skin integrity will decrease Outcome: Adequate for Discharge

## 2023-01-01 NOTE — Progress Notes (Addendum)
[address]       [place] [address]             [phone]       3 Days Post-Op Procedure(s) (LRB): XI ROBOTIC ASSISTED THORACOSCOPY-RIGHT LOWER LOBECTOMY (Right) LYMPH NODE DISSECTION (Right) INTERCOSTAL NERVE BLOCK  Subjective: She ate cheerios this am for breakfast. She is not having much nausea this am;she did have some yesterday. She feels pain is fairly well controlled except when she coughs  Objective: Vital signs in last 24 hours: Temp:  [97.8 F (36.6 C)-98.2 F (36.8 C)] 97.8 F (36.6 C) (01/15 0527) Pulse Rate:  [78-91] 86 (01/15 0527) Cardiac Rhythm: Normal sinus rhythm (01/14 1939) Resp:  [17-24] 24 (01/15 0527) BP: (107-131)/(75-88) 121/81 (01/15 0527) SpO2:  [92 %-97 %] 94 % (01/15 0527)      Intake/Output from previous day: 01/14 0701 - 01/15 0700 In: 120 [P.O.:120] Out: -    Physical Exam:  Cardiovascular: RRR Pulmonary: Clear to auscultation bilaterally Abdomen: Soft, non tender, bowel sounds present. Extremities: No calf tenderness  Wounds: Clean and dry.  No erythema or signs of infection.   Lab Results: CBC: Recent Labs    12/30/22 0006 12/31/22 0020  WBC 9.8 8.3  HGB 11.7* 13.3  HCT 35.1* 40.2  PLT 108* 129*   BMET:  Recent Labs    12/30/22 0006 12/31/22 0020  NA 134* 135  K 4.0 3.7  CL 103 105  CO2 23 21*  GLUCOSE 179* 169*  BUN 11 11  CREATININE 0.76 0.76  CALCIUM 8.5* 8.4*    PT/INR: No results for input(s): "LABPROT", "INR" in the last 72 hours. ABG:  INR: Will add last result for INR, ABG once components are confirmed Will add last 4 CBG results once components are confirmed  Assessment/Plan:  1. CV - SR. On Aldactone 100 mg daily. 2.  Pulmonary - On room air. PA/LAT CXR this am appears stable (small right apical pneumothorax). Encourage incentive spirometer. Await final pathology. 3. DM-CBGs 164/128/108. On Insulin. Will restart Glucotrol XL at discharge. 4. Possible discharge  later today if nausea improved  Donielle M ZimmermanPA-C 01/01/2023,7:16 AM  Patient seen and examined, agree with above Home later today if able to take PO Path pending  Viviann Spare C. Dorris Fetch, MD [facility] [phone]

## 2023-01-01 NOTE — TOC Transition Note (Signed)
Transition of Care [facility]) - CM/SW Discharge Note   Patient Details  Name: KAYTE BORCHARD MRN: [id number] Date of Birth: 06/10/1971  Transition of Care [facility]) CM/SW Contact:  Leone Haven, RN Phone Number: 01/01/2023, 2:36 PM   Clinical Narrative:    Patient is for dc today, has no needs.         Patient Goals and CMS Choice      Discharge Placement                         Discharge Plan and Services Additional resources added to the After Visit Summary for                                       Social Determinants of Health (SDOH) Interventions SDOH Screenings   Depression (PHQ2-9): Low Risk  (08/23/2022)  Physical Activity: Inactive (11/12/2017)  Social Connections: Somewhat Isolated (11/12/2017)  Stress: No Stress Concern Present (11/12/2017)  Tobacco Use: Medium Risk (12/30/2022)     Readmission Risk Interventions     No data to display

## 2023-01-03 ENCOUNTER — Encounter: Payer: BC Managed Care – PPO | Admitting: Thoracic Surgery (Cardiothoracic Vascular Surgery)

## 2023-01-04 ENCOUNTER — Other Ambulatory Visit: Payer: Self-pay

## 2023-01-05 LAB — SURGICAL PATHOLOGY

## 2023-01-08 ENCOUNTER — Other Ambulatory Visit: Payer: Self-pay | Admitting: Thoracic Surgery (Cardiothoracic Vascular Surgery)

## 2023-01-08 DIAGNOSIS — R911 Solitary pulmonary nodule: Secondary | ICD-10-CM

## 2023-01-09 ENCOUNTER — Ambulatory Visit
Admission: RE | Admit: 2023-01-09 | Discharge: 2023-01-09 | Disposition: A | Discharge status: Home / Self Care | Payer: BC Managed Care – PPO | Source: Ambulatory Visit | Attending: Thoracic Surgery (Cardiothoracic Vascular Surgery) | Admitting: Thoracic Surgery (Cardiothoracic Vascular Surgery)

## 2023-01-09 ENCOUNTER — Encounter: Payer: Self-pay | Admitting: Thoracic Surgery (Cardiothoracic Vascular Surgery)

## 2023-01-09 ENCOUNTER — Ambulatory Visit (INDEPENDENT_AMBULATORY_CARE_PROVIDER_SITE_OTHER): Payer: Self-pay | Admitting: Thoracic Surgery (Cardiothoracic Vascular Surgery)

## 2023-01-09 VITALS — BP 140/95 | HR 115 | Resp 18 | Ht 68.0 in | Wt 168.0 lb

## 2023-01-09 DIAGNOSIS — R911 Solitary pulmonary nodule: Secondary | ICD-10-CM

## 2023-01-09 DIAGNOSIS — Z902 Acquired absence of lung [part of]: Secondary | ICD-10-CM

## 2023-01-09 MED ORDER — OXYCODONE HCL 5 MG PO TABS: 5.0000 mg | ORAL_TABLET | Freq: Four times a day (QID) | ORAL | 0 refills | Status: DC | PRN

## 2023-01-09 MED ORDER — METHOCARBAMOL 750 MG PO TABS: 750.0000 mg | ORAL_TABLET | Freq: Three times a day (TID) | ORAL | 3 refills | Status: DC | PRN

## 2023-01-09 NOTE — Progress Notes (Signed)
[address]       [name] [address]             [phone]     HPI: Beth [name] returns for follow-up after recent right lower lobectomy.  Beth [name] is a 52 year old woman with a history of tobacco abuse, squamous cell carcinoma of the anal canal, and migraines.  She was diagnosed with anal cancer in 2021.  She was treated with chemo and radiation.  She ultimately ended up having a AP resection with end colostomy and a rectus flap to the perineum.  Recently she had a routine follow-up CT which showed a new 2.5 x 2.6 cm mass in the right lower lobe.  PET/CT showed the mass was hypermetabolic.  There were no other sites of concern.  I did a robotic assisted right lower lobectomy on 12/29/2022.  Her postoperative course was unremarkable and she went home on day 4.  Pathology showed metastatic squamous cell carcinoma.  She has been having muscle spasms.  She does have some incisional pain as well.  She was taking her oxycodone for the muscle spasms but it only helped a little.  She is not having any shortness of breath or non incisional chest pain.  Past Medical History:  Diagnosis Date   Acne    on face spirolactone for   Anal cancer (HCC) 02/25/2021   Constipation, chronic 08/08/2016   Diabetes mellitus without complication (HCC) 08/2022   Type II   Family history of adverse reaction to anesthesia    Mother has trouble waking takes longer   Herpes simplex    History of migraine    none in years   Wears glasses     Current Outpatient Medications  Medication Sig Dispense Refill   acetaminophen (TYLENOL) 500 MG tablet Take 1,000 mg by mouth every 6 (six) hours as needed (for pain.).     cetirizine (ZYRTEC) 10 MG tablet Take 10 mg by mouth in the morning.     clobetasol ointment (TEMOVATE) 0.05 % Apply 1 Application topically at bedtime. Apply to palms at bedtime and cover with gloves     docusate sodium (COLACE) 100 MG capsule Take 100 mg by mouth daily as  needed for mild constipation (in the afternoon for regularity).     gabapentin (NEURONTIN) 600 MG tablet Take 600 mg by mouth in the morning, at noon, and at bedtime. Morning, afternoon & night     glipiZIDE (GLUCOTROL XL) 2.5 MG 24 hr tablet Take 2.5 mg by mouth every morning.     hydrOXYzine (ATARAX) 25 MG tablet Take 25 mg by mouth at bedtime.     ibuprofen (ADVIL) 200 MG tablet Take 400 mg by mouth every 8 (eight) hours as needed (for pain.).     methocarbamol (ROBAXIN-750) 750 MG tablet Take 1 tablet (750 mg total) by mouth every 8 (eight) hours as needed for muscle spasms. 15 tablet 3   ondansetron (ZOFRAN-ODT) 4 MG disintegrating tablet Take 1 tablet (4 mg total) by mouth every 8 (eight) hours as needed for nausea or vomiting. 14 tablet 0   polyethylene glycol (MIRALAX / GLYCOLAX) 17 g packet Take 17 g by mouth in the morning.     SEMGLEE, YFGN, 100 UNIT/ML Pen Inject 8 Units into the skin every evening.     spironolactone (ALDACTONE) 100 MG tablet Take 100 mg by mouth daily in the afternoon.     triamcinolone cream (KENALOG) 0.5 % Apply 1  Application topically 2 (two) times daily as needed (skin irritation.).     valACYclovir (VALTREX) 500 MG tablet Take 500 mg by mouth in the morning.     oxyCODONE (OXY IR/ROXICODONE) 5 MG immediate release tablet Take 1 tablet (5 mg total) by mouth every 6 (six) hours as needed for severe pain. 30 tablet 0   No current facility-administered medications for this visit.    Physical Exam 52 year old woman in no acute distress Alert and oriented x 3 with no focal deficits Lungs absent breath sounds right base, otherwise clear Cardiac mildly tachycardic but regular No peripheral edema Incisions healing well  Diagnostic Tests: CHEST - 2 VIEW   COMPARISON:  January 01, 2023.   FINDINGS: The heart size and mediastinal contours are within normal limits. Left lung is clear. Right basilar atelectasis or infiltrate is noted with possible associated  pleural effusion. The visualized skeletal structures are unremarkable.   IMPRESSION: Right basilar opacity as described above.     Electronically Signed   By: Lupita Raider M.D.   On: 01/09/2023 13:44  Impression: Beth [name] is a 52 year old woman with a history of tobacco abuse, squamous cell carcinoma of the anal canal, and migraines.    Metastatic squamous cell carcinoma-single site of metastatic disease to the right lower lobe.  Had a complete resection.  She has a follow-up appointment with Dr. Truett Perna next week.  He will determine if there is any additional systemic therapy indicated.  Postoperative pain/muscle spasms-she takes gabapentin for hot flashes.  She is already on a relatively high dose of that.  She is still using the oxycodone.  It has been about 8 days since she was discharged in the hospital and she only has a few pills left.  I gave her another prescription for oxycodone 5 mg p.o. every 6 hours as needed for pain, 30 tablets, no refills.  I am also going to give her a prescription for Robaxin every 8 hours as needed for muscle spasms, 15 tablets, 3 refills.  Her chest x-ray shows possible small pleural effusion in addition to the volume loss from the lobectomy.  Will get a repeat chest x-ray in about 2 weeks to check up on that.  Plan: Robaxin as needed for muscle spasms Refill oxycodone Follow-up with Dr. Truett Perna as scheduled Return in 2 weeks with PA and lateral chest x-ray  Beth Slot, MD [facility] [phone]

## 2023-01-12 ENCOUNTER — Other Ambulatory Visit: Payer: Self-pay

## 2023-01-12 NOTE — Progress Notes (Signed)
The proposed treatment discussed in conference is for discussion purpose only and is not a binding recommendation.  The patients have not been physically examined, or presented with their treatment options.  Therefore, final treatment plans cannot be decided.  

## 2023-01-16 ENCOUNTER — Inpatient Hospital Stay: Payer: BC Managed Care – PPO | Admitting: Nurse Practitioner

## 2023-01-16 ENCOUNTER — Encounter: Payer: Self-pay | Admitting: Nurse Practitioner

## 2023-01-16 VITALS — BP 126/93 | HR 106 | Temp 98.1°F | Resp 18 | Ht 68.0 in | Wt 169.2 lb

## 2023-01-16 DIAGNOSIS — Z85048 Personal history of other malignant neoplasm of rectum, rectosigmoid junction, and anus: Secondary | ICD-10-CM | POA: Insufficient documentation

## 2023-01-16 DIAGNOSIS — R918 Other nonspecific abnormal finding of lung field: Secondary | ICD-10-CM | POA: Diagnosis not present

## 2023-01-16 DIAGNOSIS — C210 Malignant neoplasm of anus, unspecified: Secondary | ICD-10-CM

## 2023-01-16 NOTE — Progress Notes (Signed)
[facility] OFFICE PROGRESS NOTE   Diagnosis: Anal cancer  INTERVAL HISTORY:   Beth [name] returns as scheduled.  She has occasional dyspnea on exertion.  Periodic pain along the right lateral chest.  Colostomy functioning normally.  Objective:  Vital signs in last 24 hours:  Blood pressure (!) 126/93, pulse (!) 106, temperature 98.1 F (36.7 C), temperature source Oral, resp. rate 18, height 5\' 8"  (1.727 m), weight 169 lb 3.2 oz (76.7 kg), last menstrual period 03/13/2012, SpO2 99 %.    Lymphatics: No palpable cervical, supraclavicular, axillary or inguinal lymph nodes. Resp: Breath sounds diminished right lung base.  No respiratory distress. Cardio: Regular rate and rhythm. GI: No hepatomegaly.  Left lower quadrant colostomy. Vascular: No leg edema.   Lab Results:  Lab Results  Component Value Date   WBC 8.3 12/31/2022   HGB 13.3 12/31/2022   HCT 40.2 12/31/2022   MCV 104.7 (H) 12/31/2022   PLT 129 (L) 12/31/2022   NEUTROABS 10.9 (H) 12/03/2021    Imaging:  No results found.  Medications: I have reviewed the patient's current medications.  Assessment/Plan: Squamous cell carcinoma of the anal canal Colonoscopy 01/28/2021-rectal mass extending to the "outside", palpable on exam-biopsy revealed high-grade squamous intraepithelial lesion (AIN 3/carcinoma in situ) Exam under anesthesia with transanal excision of an anal canal mass 02/25/2021-frozen section consistent with squamous cell carcinoma, 4 x 4 centimeter sessile anal canal mass extending to the anal verge, right lateral and posterior, 30% circumferential, fixed to the anal sphincter complex; anal rectal mass with mixed high-grade neuroendocrine squamous cell carcinoma; posterior anal tag with squamous cell carcinoma; left labial lesion-condyloma acuminata CTs 03/07/2021-ill-definition of tissue planes along the anus with abnormal soft tissue prominence posteriorly along the anal canal suspicious for  mass.  Small adjacent lower perirectal lymph nodes in addition to a 0.5 cm sacral node.  Focal enhancing lesion in the right hepatic lobe 1.2 x 0.8 x 0.9 cm.  2.1 x 1.5 cm cystic lesion right ovary with thin enhancing rim. PET scan 03/16/2021-hypermetabolic mass within the anal canal.  No definite evidence of local or distant metastatic disease.  7 mm left submandibular lymph node with mild FDG uptake, likely reactive.  Focal site of FDG uptake within the inner quadrant of the right breast. Radiation 03/21/2021-04/27/2021 Cycle one 5-FU/mitomycin-C 03/21/2021 Cycle two 5-FU/Mitomycin-C 04/18/2021, 5-FU dose reduced secondary to mucositis Anoscopy by Dr. Johney Maine 07/20/2021-divet in the right posterior lateral aspect with some right anterior and right lateral folds consistent with radiation treatment.  No ulceration.  Not fully resolved but markedly shrunk down. Anorectal mass 09/30/2021 examination under anesthesia, biopsy-poorly differentiated carcinoma-basaloid squamous carcinoma and high-grade neuroendocrine carcinoma, margins positive APR/and vertical rectus abdominis flap 11/03/2021, invasive poorly differentiated carcinoma, negative resection margins, 0/10 nodes, treatment response score 2,ypT1,ypN0 CT abdomen/pelvis 12/03/2021-high-grade small bowel obstruction transitioning in the left lower abdomen.  Presacral rim-enhancing 4.4 x 2.8 x 3.8 cm fluid collection.  No air in the collection.  Prior study described a potential abnormality in the right lobe of the liver, not seen on current study.  Cystic lesion of the right ovary resolved. CT chest 12/07/2021-interval development of bilateral trace pleural effusions. CTs 12/04/2022-new 2.6 x 2.5 cm right lower lobe nodule PET 12/21/2022-FDG avid right lower lobe nodule, no other evidence of metastatic disease 12/29/2022 right lower lobectomy, lymph node dissection-poorly differentiated carcinoma consistent with metastasis, 4.8 x 2.9 x 2.6 cm, margins free, 7 lymph  nodes negative for carcinoma; cytohistomorphology correlation supports the current tumor being  a metastasis from the previously diagnosed anal carcinoma   Remote history of an anal fissure repair Tobacco use Moderate alcohol use Eczema Right breast mass- PET scan 03/16/2021 with focal FDG activity in the inner right breast Mammogram and right breast ultrasound 03/28/2021- negative Bilateral breast MRI 03/31/2021- 7 mm mass in the upper inner right breast correlating with the PET findings, no other abnormality, no adenopathy MRI guided biopsy of right breast mass 04/11/2021- fibrocystic change with sclerosing adenosis, small focus of inflammation/fibrosis suggestive of resolving fat necrosis, no evidence of malignancy--repeat bilateral breast MRI at a 47-month interval; bilateral breast MRI on 10/13/2021-no evidence of breast malignancy.  Annual screening mammography recommended.   7.  Admission with small bowel obstruction 12/03/2021 status post exploratory laparotomy and lysis of adhesions  Disposition: Beth [name] is recovering from the recent right lower lobectomy.  We reviewed the pathology report with her and her husband at today's visit.  They understand the right lower lobe lung mass appears to be a metastasis from the previous anal cancer.  No further treatment is recommended.  Plan for noncontrast CT scan of the chest in 3 months.  We will see her in follow-up a few days later.  Patient seen with Dr. Benay Spice.  Ned Card ANP/GNP-BC   01/16/2023  2:36 PM  This was a shared visit with Ned Card.  Beth [name] was interviewed and examined.  She is recovering from the right lower lobectomy procedure.  We reviewed results of the surgery pathology with her.  The lung tumor histology is consistent with a metastasis from the lung cancer.  She is now in clinical remission.  Not recommend adjuvant systemic therapy.  She will be scheduled for a surveillance chest CT in 3 months.  Her case will be  presented at the GI tumor conference.  I was present for greater than 50% of today's visit.  I performed medical decision making.   Julieanne Manson, MD

## 2023-01-17 ENCOUNTER — Encounter: Payer: Self-pay | Admitting: *Deleted

## 2023-01-17 NOTE — Progress Notes (Signed)
PATIENT NAVIGATOR PROGRESS NOTE  Name: Beth [name] Date: 01/17/2023 MRN: [id number]  DOB: September 09, 1971   Reason for visit:  Coordination of care  Comments:  Added to GI conference for 2/7 Full discussion    Time spent counseling/coordinating care: 15-30 minutes

## 2023-01-19 ENCOUNTER — Other Ambulatory Visit: Payer: Self-pay | Admitting: Thoracic Surgery (Cardiothoracic Vascular Surgery)

## 2023-01-19 DIAGNOSIS — C3490 Malignant neoplasm of unspecified part of unspecified bronchus or lung: Secondary | ICD-10-CM

## 2023-01-22 ENCOUNTER — Ambulatory Visit (INDEPENDENT_AMBULATORY_CARE_PROVIDER_SITE_OTHER): Payer: Self-pay | Admitting: Thoracic Surgery (Cardiothoracic Vascular Surgery)

## 2023-01-22 ENCOUNTER — Ambulatory Visit
Admission: RE | Admit: 2023-01-22 | Discharge: 2023-01-22 | Disposition: A | Discharge status: Home / Self Care | Payer: BC Managed Care – PPO | Source: Ambulatory Visit | Attending: Thoracic Surgery (Cardiothoracic Vascular Surgery) | Admitting: Thoracic Surgery (Cardiothoracic Vascular Surgery)

## 2023-01-22 VITALS — BP 140/83 | HR 108 | Resp 20 | Ht 68.0 in | Wt 174.0 lb

## 2023-01-22 DIAGNOSIS — Z902 Acquired absence of lung [part of]: Secondary | ICD-10-CM

## 2023-01-22 DIAGNOSIS — C3490 Malignant neoplasm of unspecified part of unspecified bronchus or lung: Secondary | ICD-10-CM

## 2023-01-22 NOTE — Progress Notes (Signed)
[address]       [place] [address]             [phone]    HPI: Beth [name] returns for scheduled follow-up visit  Beth [name] is a 52 year old woman with a history of squamous of carcinoma of the anal canal.  She a chemoradiation and then an AP resection.  She recently was noted to have a 2.5 cm mass in the right lower lobe on a follow-up CT.  On PET/CT the mass was hypermetabolic.  I did a robotic assisted right lower lobectomy on 12/29/2022.  She went home on day 4.  Pathology was consistent with metastatic squamous cell carcinoma.  I saw her in the office on 01/09/2023.  She was doing well.  She was having a lot of muscle spasms and I gave her prescription for Robaxin for that.  She also was still taking some oxycodone periodically.  In the interim since her last visit she feels like she is improving.  Less pain but still requiring oxycodone intermittently.  Still having muscle spasms for which she is using Robaxin.  That has helped significantly.  She is on a baseline dose of gabapentin 600 mg 3 times daily for hot flashes.  Past Medical History:  Diagnosis Date   Acne    on face spirolactone for   Anal cancer ([place]) 02/25/2021   Constipation, chronic 08/08/2016   Diabetes mellitus without complication ([place]) 67/6195   Type II   Family history of adverse reaction to anesthesia    Mother has trouble waking takes longer   Herpes simplex    History of migraine    none in years   Wears glasses     Current Outpatient Medications  Medication Sig Dispense Refill   acetaminophen (TYLENOL) 500 MG tablet Take 1,000 mg by mouth every 6 (six) hours as needed (for pain.).     cetirizine (ZYRTEC) 10 MG tablet Take 10 mg by mouth in the morning.     clobetasol ointment (TEMOVATE) 0.93 % Apply 1 Application topically at bedtime. Apply to palms at bedtime and cover with gloves     docusate sodium (COLACE) 100 MG capsule Take 100 mg by mouth daily as needed for  mild constipation (in the afternoon for regularity).     gabapentin (NEURONTIN) 600 MG tablet Take 600 mg by mouth in the morning, at noon, and at bedtime. Morning, afternoon & night     glipiZIDE (GLUCOTROL XL) 2.5 MG 24 hr tablet Take 2.5 mg by mouth every morning.     hydrOXYzine (ATARAX) 25 MG tablet Take 25 mg by mouth at bedtime.     methocarbamol (ROBAXIN-750) 750 MG tablet Take 1 tablet (750 mg total) by mouth every 8 (eight) hours as needed for muscle spasms. 15 tablet 3   ondansetron (ZOFRAN-ODT) 4 MG disintegrating tablet Take 1 tablet (4 mg total) by mouth every 8 (eight) hours as needed for nausea or vomiting. 14 tablet 0   oxyCODONE (OXY IR/ROXICODONE) 5 MG immediate release tablet Take 1 tablet (5 mg total) by mouth every 6 (six) hours as needed for severe pain. 30 tablet 0   polyethylene glycol (MIRALAX / GLYCOLAX) 17 g packet Take 17 g by mouth in the morning.     SEMGLEE, YFGN, 100 UNIT/ML Pen Inject 8 Units into the skin every evening.     spironolactone (ALDACTONE) 100 MG tablet Take 100 mg by mouth daily in the afternoon.  triamcinolone cream (KENALOG) 0.5 % Apply 1 Application topically 2 (two) times daily as needed (skin irritation.).     valACYclovir (VALTREX) 500 MG tablet Take 500 mg by mouth in the morning.     ibuprofen (ADVIL) 200 MG tablet Take 400 mg by mouth every 8 (eight) hours as needed (for pain.). (Patient not taking: Reported on 01/16/2023)     No current facility-administered medications for this visit.    Physical Exam BP (!) 140/83   Pulse (!) 108   Resp 20   Ht 5\' 8"  (1.727 m)   Wt 174 lb (78.9 kg)   LMP 03/13/2012   SpO2 96% Comment: RA  BMI 26.46 kg/m  Well-appearing 52 year old woman in no acute distress Alert and oriented x 3 with no focal deficits Lungs diminished at right base but otherwise clear Cardiac mildly tacky, regular Incisions clean dry and intact No peripheral edema  Diagnostic Tests: CHEST - 2 VIEW   COMPARISON:   01/09/2023   FINDINGS: Normal heart size, mediastinal contours, and pulmonary vascularity.   RIGHT basilar pleural effusion and atelectasis.   Lungs otherwise clear.   No infiltrate or pneumothorax.   Osseous structures unremarkable.   IMPRESSION: Small RIGHT pleural effusion and RIGHT basilar atelectasis.     Electronically Signed   By: Lavonia Dana M.D.   On: 01/22/2023 15:00 I personally reviewed the chest x-ray images.  Post right lower lobectomy changes.  Impression: Beth [name] is a 52 year old woman who was recently found to have a right lower lobe mass.  She underwent a right lower lobectomy.  The mass turned out to be metastatic squamous cell carcinoma.  Due to the size and location of the mass she was not a candidate for a lesser resection.  She is now about 3 weeks out from the surgery.  She is feeling better.  She does still have some incisional pain which is to be expected also still having some muscle spasms although the Robaxin is helping with that significantly.  There are no restrictions on her activities at this time.  She was cautioned to build into new activities gradually.  She saw oncology.  No additional treatment planned at present.  Plan: Return in 1 month with PA lateral chest x-ray to check on progress  Melrose Nakayama, MD [facility] [phone]

## 2023-01-24 ENCOUNTER — Other Ambulatory Visit: Payer: Self-pay

## 2023-01-24 NOTE — Progress Notes (Signed)
The proposed treatment discussed in conference is for discussion purpose only and is not a binding recommendation.  The patients have not been physically examined, or presented with their treatment options.  Therefore, final treatment plans cannot be decided.  

## 2023-02-26 ENCOUNTER — Other Ambulatory Visit: Payer: Self-pay | Admitting: Thoracic Surgery (Cardiothoracic Vascular Surgery)

## 2023-02-26 DIAGNOSIS — R918 Other nonspecific abnormal finding of lung field: Secondary | ICD-10-CM

## 2023-02-27 ENCOUNTER — Ambulatory Visit
Admission: RE | Admit: 2023-02-27 | Discharge: 2023-02-27 | Disposition: A | Discharge status: Home / Self Care | Payer: BC Managed Care – PPO | Source: Ambulatory Visit | Attending: Thoracic Surgery (Cardiothoracic Vascular Surgery) | Admitting: Thoracic Surgery (Cardiothoracic Vascular Surgery)

## 2023-02-27 ENCOUNTER — Ambulatory Visit (INDEPENDENT_AMBULATORY_CARE_PROVIDER_SITE_OTHER): Payer: Self-pay | Admitting: Thoracic Surgery (Cardiothoracic Vascular Surgery)

## 2023-02-27 VITALS — BP 131/90 | HR 89 | Resp 20 | Ht 68.0 in | Wt 172.0 lb

## 2023-02-27 DIAGNOSIS — Z902 Acquired absence of lung [part of]: Secondary | ICD-10-CM

## 2023-02-27 DIAGNOSIS — R918 Other nonspecific abnormal finding of lung field: Secondary | ICD-10-CM

## 2023-02-27 MED ORDER — OXYCODONE HCL 5 MG PO TABS: 5.0000 mg | ORAL_TABLET | Freq: Four times a day (QID) | ORAL | 0 refills | Status: DC | PRN

## 2023-02-27 NOTE — Progress Notes (Signed)
[address]       [place] [address]             [phone]     HPI: [name] returns for scheduled follow-up visit  Beth [name] is a 52 year old woman with history of squamous cell carcinoma of the anal canal who had oligometastatic disease to the right lower lobe.  She underwent robotic assisted right lower lobectomy on 12/29/2022.  I saw her in the office on 01/22/2023.  She was still having significant pain.  She was using oxycodone about once a day.  We refilled her prescription at that time.  In the interim since that visit she is feeling a little better.  She does still have significant pain and at times it does interfere with her ability to sleep.  She is requesting additional pain medication.  She has noticed that when she walks up an incline or tries to walk very rapidly she does get short of breath.  Past Medical History:  Diagnosis Date   Acne    on face spirolactone for   Anal cancer ([place]) 02/25/2021   Constipation, chronic 08/08/2016   Diabetes mellitus without complication ([place]) 123456   Type II   Family history of adverse reaction to anesthesia    Mother has trouble waking takes longer   Herpes simplex    History of migraine    none in years   Wears glasses     Current Outpatient Medications  Medication Sig Dispense Refill   acetaminophen (TYLENOL) 500 MG tablet Take 1,000 mg by mouth every 6 (six) hours as needed (for pain.).     cetirizine (ZYRTEC) 10 MG tablet Take 10 mg by mouth in the morning.     clobetasol ointment (TEMOVATE) [id number] % Apply 1 Application topically at bedtime. Apply to palms at bedtime and cover with gloves     docusate sodium (COLACE) 100 MG capsule Take 100 mg by mouth daily as needed for mild constipation (in the afternoon for regularity).     gabapentin (NEURONTIN) 600 MG tablet Take 600 mg by mouth in the morning, at noon, and at bedtime. Morning, afternoon & night     glipiZIDE (GLUCOTROL XL) 2.5 MG 24 hr  tablet Take 2.5 mg by mouth every morning.     hydrOXYzine (ATARAX) 25 MG tablet Take 25 mg by mouth at bedtime.     ibuprofen (ADVIL) 200 MG tablet Take 400 mg by mouth every 8 (eight) hours as needed (for pain.).     methocarbamol (ROBAXIN-750) 750 MG tablet Take 1 tablet (750 mg total) by mouth every 8 (eight) hours as needed for muscle spasms. 15 tablet 3   ondansetron (ZOFRAN-ODT) 4 MG disintegrating tablet Take 1 tablet (4 mg total) by mouth every 8 (eight) hours as needed for nausea or vomiting. 14 tablet 0   polyethylene glycol (MIRALAX / GLYCOLAX) 17 g packet Take 17 g by mouth in the morning.     SEMGLEE, YFGN, 100 UNIT/ML Pen Inject 8 Units into the skin every evening.     spironolactone (ALDACTONE) 100 MG tablet Take 100 mg by mouth daily in the afternoon.     triamcinolone cream (KENALOG) 0.5 % Apply 1 Application topically 2 (two) times daily as needed (skin irritation.).     valACYclovir (VALTREX) 500 MG tablet Take 500 mg by mouth in the morning.     oxyCODONE (OXY IR/ROXICODONE) 5 MG immediate release tablet Take 1 tablet (5 mg total)  by mouth every 6 (six) hours as needed for severe pain. 25 tablet 0   No current facility-administered medications for this visit.    Physical Exam BP (!) 131/90 (BP Location: Right Arm, Patient Position: Sitting)   Pulse 89   Resp 20   Ht '5\' 8"'$  (1.727 m)   Wt 172 lb (78 kg)   LMP 03/13/2012   SpO2 97% Comment: RA  BMI 26.59 kg/m  52 year old woman in no acute distress Alert and oriented x 3 with no focal deficits Cardiac regular rate and rhythm Lungs diminished at right base otherwise clear  Diagnostic Tests: I personally reviewed the chest x-ray.  Postoperative changes from right lower lobectomy.  Overall excellent postop appearance.  Impression: Beth [name] is a 52 year old woman with history of squamous cell carcinoma of the anal canal who had oligometastatic disease to the right lower lobe.  She underwent robotic assisted right  lower lobectomy on 12/29/2022.  Overall she is doing well.  Her respiratory reserve is diminished from preoperatively as expected.  Should still improve with time and continued exercise.  Still has some pain.  Not really intercostal neuralgia.  It is more localized posteriorly.  I do not see any obvious rib fracture, but she may have a very subtle one there.  Also could just be incisional pain.  Nothing abnormal on chest x-ray.  Continue with Tylenol.  She is on gabapentin chronically for hot flashes.  I did give her another prescription for oxycodone 5 mg tablets 1 p.o. every 6 hours as needed, 25 tablets, no refills.  Plan: Return in 6 weeks to check on progress She will not need a chest x-ray unless there is an indication.  Melrose Nakayama, MD [facility] [phone]

## 2023-03-16 ENCOUNTER — Ambulatory Visit: Payer: BC Managed Care – PPO | Admitting: Nurse Practitioner

## 2023-04-06 DIAGNOSIS — K760 Fatty (change of) liver, not elsewhere classified: Secondary | ICD-10-CM | POA: Insufficient documentation

## 2023-04-10 ENCOUNTER — Ambulatory Visit: Payer: BC Managed Care – PPO | Admitting: Thoracic Surgery (Cardiothoracic Vascular Surgery)

## 2023-04-17 ENCOUNTER — Encounter: Payer: Self-pay | Admitting: Thoracic Surgery (Cardiothoracic Vascular Surgery)

## 2023-04-17 ENCOUNTER — Ambulatory Visit (HOSPITAL_BASED_OUTPATIENT_CLINIC_OR_DEPARTMENT_OTHER)
Admission: RE | Admit: 2023-04-17 | Discharge: 2023-04-17 | Disposition: A | Discharge status: Home / Self Care | Payer: BC Managed Care – PPO | Source: Ambulatory Visit | Attending: Nurse Practitioner | Admitting: Nurse Practitioner

## 2023-04-17 ENCOUNTER — Ambulatory Visit (INDEPENDENT_AMBULATORY_CARE_PROVIDER_SITE_OTHER): Payer: BC Managed Care – PPO | Admitting: Thoracic Surgery (Cardiothoracic Vascular Surgery)

## 2023-04-17 ENCOUNTER — Ambulatory Visit (HOSPITAL_BASED_OUTPATIENT_CLINIC_OR_DEPARTMENT_OTHER)
Admission: RE | Admit: 2023-04-17 | Discharge: 2023-04-17 | Disposition: A | Discharge status: Home / Self Care | Payer: BC Managed Care – PPO | Source: Ambulatory Visit | Attending: *Deleted | Admitting: *Deleted

## 2023-04-17 VITALS — BP 151/100 | HR 108 | Resp 20 | Ht 68.0 in | Wt 176.0 lb

## 2023-04-17 VITALS — BP 151/97 | HR 110 | Temp 97.8°F | Resp 20

## 2023-04-17 DIAGNOSIS — K9400 Colostomy complication, unspecified: Secondary | ICD-10-CM | POA: Insufficient documentation

## 2023-04-17 DIAGNOSIS — Z902 Acquired absence of lung [part of]: Secondary | ICD-10-CM | POA: Diagnosis not present

## 2023-04-17 DIAGNOSIS — R319 Hematuria, unspecified: Secondary | ICD-10-CM

## 2023-04-17 DIAGNOSIS — C210 Malignant neoplasm of anus, unspecified: Secondary | ICD-10-CM | POA: Diagnosis present

## 2023-04-17 LAB — URINALYSIS, W/ REFLEX TO CULTURE (INFECTION SUSPECTED)
Bacteria, UA: NONE SEEN
Bilirubin Urine: NEGATIVE
Glucose, UA: NEGATIVE mg/dL
Hgb urine dipstick: NEGATIVE
Ketones, ur: NEGATIVE mg/dL
Leukocytes,Ua: NEGATIVE
Nitrite: NEGATIVE
Protein, ur: NEGATIVE mg/dL
Specific Gravity, Urine: 1.023 (ref 1.005–1.030)
pH: 6 (ref 5.0–8.0)

## 2023-04-17 NOTE — Progress Notes (Signed)
[facility]   Reason for visit:  LLQ flush colostomy HPI:  Anal cancer with end colostomy Past Medical History:  Diagnosis Date   Acne    on face spirolactone for   Anal cancer ([facility]) 02/25/2021   Constipation, chronic 08/08/2016   Diabetes mellitus without complication ([facility]) 08/2022   Type II   Family history of adverse reaction to anesthesia    Mother has trouble waking takes longer   Herpes simplex    History of migraine    none in years   Wears glasses    Family History  Problem Relation Age of Onset   Breast cancer Paternal Aunt    Allergies  Allergen Reactions   Clindamycin/Lincomycin Rash   Sulfa Drugs Cross Reactors Itching and Rash   Current Outpatient Medications  Medication Sig Dispense Refill Last Dose   acetaminophen (TYLENOL) 500 MG tablet Take 1,000 mg by mouth every 6 (six) hours as needed (for pain.).      cetirizine (ZYRTEC) 10 MG tablet Take 10 mg by mouth in the morning.      clobetasol ointment (TEMOVATE) 0.05 % Apply 1 Application topically at bedtime. Apply to palms at bedtime and cover with gloves      docusate sodium (COLACE) 100 MG capsule Take 100 mg by mouth daily as needed for mild constipation (in the afternoon for regularity).      gabapentin (NEURONTIN) 600 MG tablet Take 900 mg by mouth in the morning, at noon, and at bedtime. Morning, afternoon & night      glipiZIDE (GLUCOTROL XL) 2.5 MG 24 hr tablet Take 2.5 mg by mouth every morning.      hydrOXYzine (ATARAX) 25 MG tablet Take 25 mg by mouth at bedtime.      ibuprofen (ADVIL) 200 MG tablet Take 400 mg by mouth every 8 (eight) hours as needed (for pain.).      polyethylene glycol (MIRALAX / GLYCOLAX) 17 g packet Take 17 g by mouth in the morning.      SEMGLEE, YFGN, 100 UNIT/ML Pen Inject 8 Units into the skin every evening.      spironolactone (ALDACTONE) 100 MG tablet Take 100 mg by mouth daily in the afternoon.      triamcinolone cream (KENALOG) 0.5 % Apply 1 Application  topically 2 (two) times daily as needed (skin irritation.).      valACYclovir (VALTREX) 500 MG tablet Take 500 mg by mouth in the morning.      No current facility-administered medications for this encounter.   ROS  Review of Systems  Gastrointestinal:  Positive for abdominal pain and constipation.  Genitourinary:  Positive for frequency, hematuria and urgency.  Psychiatric/Behavioral: Negative.    All other systems reviewed and are negative.  Vital signs:  BP (!) 151/97 (BP Location: Right Arm)   Pulse (!) 110   Temp 97.8 F (36.6 C) (Oral)   Resp 20   LMP 03/13/2012   SpO2 97%  Exam:  Physical Exam Vitals reviewed.  Constitutional:      Appearance: Normal appearance.  Abdominal:     Palpations: Abdomen is soft.  Skin:    General: Skin is warm and dry.  Neurological:     Mental Status: She is alert and oriented to person, place, and time.  Psychiatric:        Mood and Affect: Mood normal.        Behavior: Behavior normal.     Stoma type/location:  LLQ colostomy Stomal assessment/size:  1 1/4" flush pink stoma  some bleeding at 9 o'clock, mucosa is friable along the edge, patient reassured there is no cause for concern Peristomal assessment:  skin intact Treatment options for stomal/peristomal skin: is now back to using convex pouch with  barrier ring.  Stoma powder and skin prep  Output: thick brown stool  some constipation.  Will increase miralax to twice daily to thin stool.  Ostomy pouching: 1pc. Convex  with barrier ring Education provided:  Patient has noted blood in her urine.  Positive for frequency and urgency.  I obtain a UA today to screen for UTI.      Impression/dx  Colostomy Constipation Hematuria unknown origin Discussion  Will obtain UA   Continue same pouch.- ensure barrier opening is cut larger than stoma. Skin is protected with barrier ring Plan   See back as needed.  Once UA results back, will follow up as needed with PCP    Visit time: 50  minutes.   Maple Hudson FNP-BC

## 2023-04-17 NOTE — Progress Notes (Signed)
[address]       [name] [address]             [phone]    HPI: [name] returns for a scheduled follow-up visit  Beth [name] is a 52 year old woman with a history of squamous cell carcinoma of the anal canal who was found to have oligometastatic disease to the right lower lobe.  I did a robotic assisted right lower lobectomy on 12/29/2022.  I last saw her on 02/27/2023.  She was improving but still had some significant pain.  She was on Neurontin.  I did refill her oxycodone at that time.  In the interim since that visit she has been feeling better.  She does still have some unusual sensations and discomfort along the right costal margin and right upper quadrant.  No longer using narcotics.  Exercise tolerance is gradually improving.  Having some discomfort at her ostomy site and is going to see the ostomy nurse later today.  Past Medical History:  Diagnosis Date   Acne    on face spirolactone for   Anal cancer ([facility]) 02/25/2021   Constipation, chronic 08/08/2016   Diabetes mellitus without complication ([facility]) 08/2022   Type II   Family history of adverse reaction to anesthesia    Mother has trouble waking takes longer   Herpes simplex    History of migraine    none in years   Wears glasses     Current Outpatient Medications  Medication Sig Dispense Refill   acetaminophen (TYLENOL) 500 MG tablet Take 1,000 mg by mouth every 6 (six) hours as needed (for pain.).     cetirizine (ZYRTEC) 10 MG tablet Take 10 mg by mouth in the morning.     clobetasol ointment (TEMOVATE) 0.05 % Apply 1 Application topically at bedtime. Apply to palms at bedtime and cover with gloves     docusate sodium (COLACE) 100 MG capsule Take 100 mg by mouth daily as needed for mild constipation (in the afternoon for regularity).     gabapentin (NEURONTIN) 600 MG tablet Take 900 mg by mouth in the morning, at noon, and at bedtime. Morning, afternoon & night     glipiZIDE (GLUCOTROL  XL) 2.5 MG 24 hr tablet Take 2.5 mg by mouth every morning.     hydrOXYzine (ATARAX) 25 MG tablet Take 25 mg by mouth at bedtime.     ibuprofen (ADVIL) 200 MG tablet Take 400 mg by mouth every 8 (eight) hours as needed (for pain.).     polyethylene glycol (MIRALAX / GLYCOLAX) 17 g packet Take 17 g by mouth in the morning.     SEMGLEE, YFGN, 100 UNIT/ML Pen Inject 8 Units into the skin every evening.     spironolactone (ALDACTONE) 100 MG tablet Take 100 mg by mouth daily in the afternoon.     triamcinolone cream (KENALOG) 0.5 % Apply 1 Application topically 2 (two) times daily as needed (skin irritation.).     valACYclovir (VALTREX) 500 MG tablet Take 500 mg by mouth in the morning.     No current facility-administered medications for this visit.    Physical Exam BP (!) 151/100 (BP Location: Right Arm, Patient Position: Sitting)   Pulse (!) 108   Resp 20   Ht 5\' 8"  (1.727 m)   Wt 176 lb (79.8 kg)   LMP 03/13/2012   SpO2 99% Comment: RA  BMI 26.57 kg/m  52 year old woman in no acute distress Alert and  oriented x 3 with no focal deficit Lungs diminished at right base but otherwise clear Incisions well-healed  Diagnostic Tests: She had a CT of the chest this morning.  Has not been officially read.  I reviewed the images and see postoperative changes.  Do not see any evidence of recurrent disease.  Await final radiology interpretation.  Impression: Beth [name] is a 52 year old woman with a history of squamous cell carcinoma of the anal canal who was found to have oligometastatic disease to the right lower lobe.  I did a robotic assisted right lower lobectomy on 12/29/2022.  Status post right lower lobectomy-does have some pain which has not unexpected at this point.  Should continue to improve over time.  Not currently requiring any narcotics.  She is still on gabapentin.  May be able to wean off that at some point in the future.  CT this morning has not been officially read.  I do not  see any obvious evidence of recurrent disease.  She has a follow-up appoint with Dr. Truett Perna later this week.  Plan: She will follow-up with Dr. Truett Perna I will be happy to see her back anytime in the future if I can be of any further assistance with her care  Loreli Slot, MD [facility] [phone]

## 2023-04-17 NOTE — Discharge Instructions (Signed)
Will follow up after UA results back

## 2023-04-19 ENCOUNTER — Inpatient Hospital Stay: Payer: BC Managed Care – PPO | Attending: Oncology | Admitting: Oncology

## 2023-04-19 VITALS — BP 162/96 | HR 84 | Temp 98.1°F | Resp 20 | Ht 68.0 in | Wt 176.5 lb

## 2023-04-19 DIAGNOSIS — J90 Pleural effusion, not elsewhere classified: Secondary | ICD-10-CM | POA: Diagnosis not present

## 2023-04-19 DIAGNOSIS — Z85048 Personal history of other malignant neoplasm of rectum, rectosigmoid junction, and anus: Secondary | ICD-10-CM | POA: Insufficient documentation

## 2023-04-19 DIAGNOSIS — L309 Dermatitis, unspecified: Secondary | ICD-10-CM | POA: Diagnosis not present

## 2023-04-19 DIAGNOSIS — Z902 Acquired absence of lung [part of]: Secondary | ICD-10-CM | POA: Insufficient documentation

## 2023-04-19 DIAGNOSIS — Z720 Tobacco use: Secondary | ICD-10-CM | POA: Insufficient documentation

## 2023-04-19 DIAGNOSIS — F1090 Alcohol use, unspecified, uncomplicated: Secondary | ICD-10-CM | POA: Insufficient documentation

## 2023-04-19 DIAGNOSIS — C210 Malignant neoplasm of anus, unspecified: Secondary | ICD-10-CM

## 2023-04-19 NOTE — Progress Notes (Signed)
[facility] OFFICE PROGRESS NOTE   Diagnosis: Anal cancer  INTERVAL HISTORY:   Ms. Hammersmith returns as scheduled.  She generally feels well.  Mild exertional dyspnea.  She had an episode of hematuria last week.  This has not recurred.  The colostomy is functioning well.  She saw Dr. Dorris Fetch last week.  Her chief complaint is hot flashes.  The hot flashes are partially relieved with gabapentin.  Objective:  Vital signs in last 24 hours:  Blood pressure (!) 162/96, pulse 84, temperature 98.1 F (36.7 C), temperature source Oral, resp. rate 20, height 5\' 8"  (1.727 m), weight 176 lb 8 oz (80.1 kg), last menstrual period 03/13/2012, SpO2 99 %.     Lymphatics: No cervical, supraclavicular, axillary, or inguinal nodes Resp: Clear bilaterally, slight decrease in breath sounds at the right posterior base, no respiratory distress Cardio: Regular rate and rhythm GI: No hepatosplenomegaly, left lower quadrant colostomy, no mass Vascular: Leg edema  Skin: Perineal scar without evidence of tumor recurrence  Lab Results:  Lab Results  Component Value Date   WBC 8.3 12/31/2022   HGB 13.3 12/31/2022   HCT 40.2 12/31/2022   MCV 104.7 (H) 12/31/2022   PLT 129 (L) 12/31/2022   NEUTROABS 10.9 (H) 12/03/2021    CMP  Lab Results  Component Value Date   NA 135 12/31/2022   K 3.7 12/31/2022   CL 105 12/31/2022   CO2 21 (L) 12/31/2022   GLUCOSE 169 (H) 12/31/2022   BUN 11 12/31/2022   CREATININE 0.76 12/31/2022   CALCIUM 8.4 (L) 12/31/2022   PROT 6.3 (L) 12/31/2022   ALBUMIN 3.5 12/31/2022   AST 52 (H) 12/31/2022   ALT 84 (H) 12/31/2022   ALKPHOS 59 12/31/2022   BILITOT 1.0 12/31/2022   GFRNONAA >60 12/31/2022   GFRAA 106 11/29/2020    No results found for: "CEA1", "CEA", "CAN199", "CA125"  Lab Results  Component Value Date   INR 1.1 12/27/2022   LABPROT 13.9 12/27/2022    Imaging:  CT Chest Wo Contrast  Result Date: 04/18/2023 CLINICAL DATA:  Anal  cancer. Lung cancer. Monitoring disease. * Tracking Code: BO * EXAM: CT CHEST WITHOUT CONTRAST TECHNIQUE: Multidetector CT imaging of the chest was performed following the standard protocol without IV contrast. RADIATION DOSE REDUCTION: This exam was performed according to the departmental dose-optimization program which includes automated exposure control, adjustment of the mA and/or kV according to patient size and/or use of iterative reconstruction technique. COMPARISON:  CT scan 12/04/2022. PET-CT 12/21/2022. Other comparisons available as well FINDINGS: Cardiovascular: Trace pericardial fluid. Coronary artery calcifications are seen. Please correlate for other coronary risk factors. Heart is nonenlarged. The thoracic aorta on this noncontrast examination has a normal course and caliber. Diameter of the ascending aorta measures only 3.7 cm. Mediastinum/Nodes: Normal caliber thoracic esophagus. Preserved thyroid gland. No specific abnormal lymph node enlargement on this noncontrast examination in the axillary region, hilum or mediastinum. Surgical changes at the right lung hilum are new from previous. Please correlate with interval lobectomy. Lungs/Pleura: There is a new small right pleural effusion with adjacent lung opacity at the right lung base in the area of surgical change. The previous mass lesion is no longer identified in the right lower lobe accordingly. No new right-sided lung nodule. No right pneumothorax. The left lung is clear. No consolidation, pneumothorax or effusion. Upper Abdomen: No acute abnormality. Musculoskeletal: No chest wall mass or suspicious bone lesions identified. IMPRESSION: Interval right lower lobectomy with resection of the  right-sided lung mass. Associated pleural thickening and small right pleural effusion. No new mass lesion or lymph node enlargement in the thorax on this noncontrast study. Aortic Atherosclerosis (ICD10-I70.0). Electronically Signed   By: Karen Kays M.D.    On: 04/18/2023 17:08    Medications: I have reviewed the patient's current medications.   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  Small adjacent lower perirectal lymph nodes in addition to a 0.5 cm sacral node.  Focal enhancing lesion in the right hepatic lobe 1.2 x 0.8 x 0.9 cm.  2.1 x 1.5 cm cystic lesion right ovary with thin enhancing rim. PET scan 03/16/2021-hypermetabolic mass within the anal canal.  No definite evidence of local or distant metastatic disease.  7 mm left submandibular lymph node with mild FDG uptake, likely reactive.  Focal site of FDG uptake within the inner quadrant of the right breast. Radiation 03/21/2021-04/27/2021 Cycle one 5-FU/mitomycin-C 03/21/2021 Cycle two 5-FU/Mitomycin-C 04/18/2021, 5-FU dose reduced secondary to mucositis Anoscopy by Dr. Michaell Cowing 07/20/2021-divet in the right posterior lateral aspect with some right anterior and right lateral folds consistent with radiation treatment.  No ulceration.  Not fully resolved but markedly shrunk down. Anorectal mass 09/30/2021 examination under anesthesia, biopsy-poorly differentiated carcinoma-basaloid squamous carcinoma and high-grade neuroendocrine  carcinoma, margins positive APR/and vertical rectus abdominis flap 11/03/2021, invasive poorly differentiated carcinoma, negative resection margins, 0/10 nodes, treatment response score 2,ypT1,ypN0 CT abdomen/pelvis 12/03/2021-high-grade small bowel obstruction transitioning in the left lower abdomen.  Presacral rim-enhancing 4.4 x 2.8 x 3.8 cm fluid collection.  No air in the collection.  Prior study described a potential abnormality in the right lobe of the liver, not seen on current study.  Cystic lesion of the right ovary resolved. CT chest 12/07/2021-interval development of bilateral trace pleural effusions. CTs 12/04/2022-new 2.6 x 2.5 cm right lower lobe nodule PET 12/21/2022-FDG avid right lower lobe nodule, no other evidence of metastatic disease 12/29/2022 right lower lobectomy, lymph node dissection-poorly differentiated carcinoma consistent with metastasis, 4.8 x 2.9 x 2.6 cm, margins free, 7 lymph nodes negative for carcinoma; cytohistomorphology correlation supports the current tumor being a metastasis from the previously diagnosed anal carcinoma CT chest 04/17/2023-interval right lower lobectomy with resection of right lung mass, pleural thickening and small right pleural effusion, no new mass or lymph node enlargement   Remote history of an anal fissure repair Tobacco use Moderate alcohol use Eczema Right breast mass- PET scan 03/16/2021 with focal FDG activity in the inner right breast Mammogram and right breast ultrasound 03/28/2021- negative Bilateral breast MRI 03/31/2021- 7 mm mass in the upper inner right breast correlating with the PET findings, no other abnormality, no adenopathy MRI guided biopsy of right breast mass 04/11/2021- fibrocystic change with sclerosing adenosis, small focus of inflammation/fibrosis suggestive of resolving fat necrosis, no evidence of malignancy--repeat bilateral breast MRI at a 65-month interval; bilateral breast MRI on 10/13/2021-no evidence of breast  malignancy.  Annual screening mammography recommended.   7.  Admission with small bowel obstruction 12/03/2021 status post exploratory laparotomy and lysis of adhesions    Disposition: Ms. Seelbach appears stable.  She is in clinical remission from anal cancer.  She will return for an office visit and surveillance imaging in 3 months.  She reports an episode of gross hematuria last week.  Urinalysis the following day was negative for blood.  She will seek medical attention for recurrent hematuria.  Thornton Papas, MD  04/19/2023  8:40 AM

## 2023-07-20 ENCOUNTER — Inpatient Hospital Stay: Payer: BC Managed Care – PPO | Attending: Oncology

## 2023-07-20 ENCOUNTER — Ambulatory Visit (HOSPITAL_BASED_OUTPATIENT_CLINIC_OR_DEPARTMENT_OTHER)
Admission: RE | Admit: 2023-07-20 | Discharge: 2023-07-20 | Disposition: A | Discharge status: Home / Self Care | Payer: BC Managed Care – PPO | Source: Ambulatory Visit | Attending: Oncology | Admitting: Oncology

## 2023-07-20 DIAGNOSIS — F1090 Alcohol use, unspecified, uncomplicated: Secondary | ICD-10-CM | POA: Diagnosis not present

## 2023-07-20 DIAGNOSIS — L309 Dermatitis, unspecified: Secondary | ICD-10-CM | POA: Insufficient documentation

## 2023-07-20 DIAGNOSIS — Z85048 Personal history of other malignant neoplasm of rectum, rectosigmoid junction, and anus: Secondary | ICD-10-CM | POA: Insufficient documentation

## 2023-07-20 DIAGNOSIS — C210 Malignant neoplasm of anus, unspecified: Secondary | ICD-10-CM

## 2023-07-20 DIAGNOSIS — Z720 Tobacco use: Secondary | ICD-10-CM | POA: Insufficient documentation

## 2023-07-20 LAB — BASIC METABOLIC PANEL - CANCER CENTER ONLY
Anion gap: 7 (ref 5–15)
BUN: 10 mg/dL (ref 6–20)
CO2: 28 mmol/L (ref 22–32)
Calcium: 9.9 mg/dL (ref 8.9–10.3)
Chloride: 103 mmol/L (ref 98–111)
Creatinine: 0.58 mg/dL (ref 0.44–1.00)
GFR, Estimated: >60 mL/min (ref >60)
Glucose, Bld: 147 mg/dL — ABNORMAL HIGH (ref 70–99)
Potassium: 4.2 mmol/L (ref 3.5–5.1)
Sodium: 138 mmol/L (ref 135–145)

## 2023-07-20 MED ORDER — IOHEXOL 300 MG/ML  SOLN
100.0000 mL | Freq: Once | INTRAMUSCULAR | Status: AC | PRN
  Administered 2023-07-20: 100 mL via INTRAVENOUS

## 2023-07-24 ENCOUNTER — Inpatient Hospital Stay (HOSPITAL_BASED_OUTPATIENT_CLINIC_OR_DEPARTMENT_OTHER): Payer: BC Managed Care – PPO | Admitting: Nurse Practitioner

## 2023-07-24 ENCOUNTER — Encounter: Payer: Self-pay | Admitting: Nurse Practitioner

## 2023-07-24 VITALS — BP 130/84 | HR 88 | Temp 98.1°F | Resp 18 | Ht 68.0 in | Wt 177.0 lb

## 2023-07-24 DIAGNOSIS — C210 Malignant neoplasm of anus, unspecified: Secondary | ICD-10-CM | POA: Diagnosis not present

## 2023-07-24 DIAGNOSIS — Z85048 Personal history of other malignant neoplasm of rectum, rectosigmoid junction, and anus: Secondary | ICD-10-CM | POA: Diagnosis not present

## 2023-07-24 NOTE — Progress Notes (Signed)
[name] OFFICE PROGRESS NOTE   Diagnosis: Anal cancer  INTERVAL HISTORY:   Beth [name] returns as scheduled.  She feels well.  Colostomy is functioning normally.  No shortness of breath or cough.  She has good appetite.  She denies pain.  She continues to have intermittent hematuria.  Objective:  Vital signs in last 24 hours:  Blood pressure 130/84, pulse 88, temperature 98.1 F (36.7 C), temperature source Oral, resp. rate 18, height 5\' 8"  (1.727 m), weight 177 lb (80.3 kg), last menstrual period 03/13/2012, SpO2 100%.    Lymphatics: No palpable cervical, supraclavicular, axillary or inguinal lymph nodes. Resp: Lungs clear bilaterally. Cardio: Regular rate and rhythm. GI: No hepatosplenomegaly.  No mass.  Left lower quadrant colostomy. Vascular: No leg edema. Skin: Perineal scar is smooth, no evidence of tumor recurrence.   Lab Results:  Lab Results  Component Value Date   WBC 8.3 12/31/2022   HGB 13.3 12/31/2022   HCT 40.2 12/31/2022   MCV 104.7 (H) 12/31/2022   PLT 129 (L) 12/31/2022   NEUTROABS 10.9 (H) 12/03/2021    Imaging:  No results found.  Medications: I have reviewed the patient's current medications.  Assessment/Plan: Squamous cell carcinoma of the anal canal Colonoscopy 01/28/2021-rectal mass extending to the "outside", palpable on exam-biopsy revealed high-grade squamous intraepithelial lesion (AIN 3/carcinoma in situ) Exam under anesthesia with transanal excision of an anal canal mass 02/25/2021-frozen section consistent with squamous cell carcinoma, 4 x 4 centimeter sessile anal canal mass extending to the anal verge, right lateral and posterior, 30% circumferential, fixed to the anal sphincter complex; anal rectal mass with mixed high-grade neuroendocrine squamous cell carcinoma; posterior anal tag with squamous cell carcinoma; left labial lesion-condyloma acuminata CTs 03/07/2021-ill-definition of tissue planes along the anus with  abnormal soft tissue prominence posteriorly along the anal canal suspicious for mass.  Small adjacent lower perirectal lymph nodes in addition to a 0.5 cm sacral node.  Focal enhancing lesion in the right hepatic lobe 1.2 x 0.8 x 0.9 cm.  2.1 x 1.5 cm cystic lesion right ovary with thin enhancing rim. PET scan 03/16/2021-hypermetabolic mass within the anal canal.  No definite evidence of local or distant metastatic disease.  7 mm left submandibular lymph node with mild FDG uptake, likely reactive.  Focal site of FDG uptake within the inner quadrant of the right breast. Radiation 03/21/2021-04/27/2021 Cycle one 5-FU/mitomycin-C 03/21/2021 Cycle two 5-FU/Mitomycin-C 04/18/2021, 5-FU dose reduced secondary to mucositis Anoscopy by Dr. Michaell Cowing 07/20/2021-divet in the right posterior lateral aspect with some right anterior and right lateral folds consistent with radiation treatment.  No ulceration.  Not fully resolved but markedly shrunk down. Anorectal mass 09/30/2021 examination under anesthesia, biopsy-poorly differentiated carcinoma-basaloid squamous carcinoma and high-grade neuroendocrine carcinoma, margins positive APR/and vertical rectus abdominis flap 11/03/2021, invasive poorly differentiated carcinoma, negative resection margins, 0/10 nodes, treatment response score 2,ypT1,ypN0 CT abdomen/pelvis 12/03/2021-high-grade small bowel obstruction transitioning in the left lower abdomen.  Presacral rim-enhancing 4.4 x 2.8 x 3.8 cm fluid collection.  No air in the collection.  Prior study described a potential abnormality in the right lobe of the liver, not seen on current study.  Cystic lesion of the right ovary resolved. CT chest 12/07/2021-interval development of bilateral trace pleural effusions. CTs 12/04/2022-new 2.6 x 2.5 cm right lower lobe nodule PET 12/21/2022-FDG avid right lower lobe nodule, no other evidence of metastatic disease 12/29/2022 right lower lobectomy, lymph node dissection-poorly differentiated  carcinoma consistent with metastasis, 4.8 x 2.9 x 2.6 cm, margins  free, 7 lymph nodes negative for carcinoma; cytohistomorphology correlation supports the current tumor being a metastasis from the previously diagnosed anal carcinoma CT chest 04/17/2023-interval right lower lobectomy with resection of right lung mass, pleural thickening and small right pleural effusion, no new mass or lymph node enlargement CTs 07/20/2023-diminished postoperative right pleural effusion.  Unchanged postoperative findings status post abdominoperineal resection with left lower quadrant and colostomy.  No evidence of metastatic disease.   Remote history of an anal fissure repair Tobacco use Moderate alcohol use Eczema Right breast mass- PET scan 03/16/2021 with focal FDG activity in the inner right breast Mammogram and right breast ultrasound 03/28/2021- negative Bilateral breast MRI 03/31/2021- 7 mm mass in the upper inner right breast correlating with the PET findings, no other abnormality, no adenopathy MRI guided biopsy of right breast mass 04/11/2021- fibrocystic change with sclerosing adenosis, small focus of inflammation/fibrosis suggestive of resolving fat necrosis, no evidence of malignancy--repeat bilateral breast MRI at a 20-month interval; bilateral breast MRI on 10/13/2021-no evidence of breast malignancy.  Annual screening mammography recommended.   7.  Admission with small bowel obstruction 12/03/2021 status post exploratory laparotomy and lysis of adhesions  Disposition: Beth [name] appears stable.  Recent CTs negative for recurrent/metastatic disease.  She will return for an office visit in 3 months.  She continues to have intermittent hematuria.  Referred to urology.    [name] ANP/GNP-BC   07/24/2023  8:43 AM

## 2023-07-27 ENCOUNTER — Telehealth: Payer: Self-pay

## 2023-07-27 NOTE — Telephone Encounter (Signed)
The patient is scheduled at [facility] on September 11, 2023, at 8:30 AM.

## 2023-08-31 ENCOUNTER — Encounter (HOSPITAL_BASED_OUTPATIENT_CLINIC_OR_DEPARTMENT_OTHER): Payer: Self-pay | Admitting: Emergency Medicine

## 2023-08-31 ENCOUNTER — Other Ambulatory Visit: Payer: Self-pay

## 2023-08-31 ENCOUNTER — Emergency Department (HOSPITAL_BASED_OUTPATIENT_CLINIC_OR_DEPARTMENT_OTHER): Payer: BC Managed Care – PPO

## 2023-08-31 ENCOUNTER — Inpatient Hospital Stay: Payer: BC Managed Care – PPO | Attending: Oncology | Admitting: Oncology

## 2023-08-31 ENCOUNTER — Other Ambulatory Visit (HOSPITAL_BASED_OUTPATIENT_CLINIC_OR_DEPARTMENT_OTHER): Payer: Self-pay

## 2023-08-31 ENCOUNTER — Encounter: Payer: Self-pay | Admitting: *Deleted

## 2023-08-31 ENCOUNTER — Emergency Department (HOSPITAL_BASED_OUTPATIENT_CLINIC_OR_DEPARTMENT_OTHER)
Admission: EM | Admit: 2023-08-31 | Discharge: 2023-08-31 | Disposition: A | Discharge status: Home / Self Care | Payer: BC Managed Care – PPO | Attending: Emergency Medicine | Admitting: Emergency Medicine

## 2023-08-31 VITALS — BP 167/95 | HR 87 | Temp 98.0°F | Resp 18 | Ht 67.0 in | Wt 175.7 lb

## 2023-08-31 DIAGNOSIS — R222 Localized swelling, mass and lump, trunk: Secondary | ICD-10-CM

## 2023-08-31 DIAGNOSIS — R7401 Elevation of levels of liver transaminase levels: Secondary | ICD-10-CM | POA: Insufficient documentation

## 2023-08-31 DIAGNOSIS — Z85048 Personal history of other malignant neoplasm of rectum, rectosigmoid junction, and anus: Secondary | ICD-10-CM | POA: Insufficient documentation

## 2023-08-31 DIAGNOSIS — Z7984 Long term (current) use of oral hypoglycemic drugs: Secondary | ICD-10-CM | POA: Insufficient documentation

## 2023-08-31 DIAGNOSIS — R1011 Right upper quadrant pain: Secondary | ICD-10-CM | POA: Diagnosis present

## 2023-08-31 DIAGNOSIS — I10 Essential (primary) hypertension: Secondary | ICD-10-CM | POA: Insufficient documentation

## 2023-08-31 DIAGNOSIS — E119 Type 2 diabetes mellitus without complications: Secondary | ICD-10-CM | POA: Insufficient documentation

## 2023-08-31 DIAGNOSIS — Z85038 Personal history of other malignant neoplasm of large intestine: Secondary | ICD-10-CM | POA: Insufficient documentation

## 2023-08-31 DIAGNOSIS — Z79899 Other long term (current) drug therapy: Secondary | ICD-10-CM | POA: Insufficient documentation

## 2023-08-31 DIAGNOSIS — C210 Malignant neoplasm of anus, unspecified: Secondary | ICD-10-CM

## 2023-08-31 LAB — CBC
HCT: 43.4 % (ref 36.0–46.0)
Hemoglobin: 14.9 g/dL (ref 12.0–15.0)
MCH: 34.2 pg — ABNORMAL HIGH (ref 26.0–34.0)
MCHC: 34.3 g/dL (ref 30.0–36.0)
MCV: 99.5 fL (ref 80.0–100.0)
Platelets: 146 10*3/uL — ABNORMAL LOW (ref 150–400)
RBC: 4.36 MIL/uL (ref 3.87–5.11)
RDW: 12.6 % (ref 11.5–15.5)
WBC: 6.6 10*3/uL (ref 4.0–10.5)
nRBC: 0 % (ref 0.0–0.2)

## 2023-08-31 LAB — URINALYSIS, ROUTINE W REFLEX MICROSCOPIC
Bilirubin Urine: NEGATIVE
Glucose, UA: NEGATIVE mg/dL
Hgb urine dipstick: NEGATIVE
Ketones, ur: NEGATIVE mg/dL
Leukocytes,Ua: NEGATIVE
Nitrite: NEGATIVE
Specific Gravity, Urine: 1.02 (ref 1.005–1.030)
pH: 6 (ref 5.0–8.0)

## 2023-08-31 LAB — COMPREHENSIVE METABOLIC PANEL
ALT: 188 U/L — ABNORMAL HIGH (ref 0–44)
AST: 110 U/L — ABNORMAL HIGH (ref 15–41)
Albumin: 4.6 g/dL (ref 3.5–5.0)
Alkaline Phosphatase: 84 U/L (ref 38–126)
Anion gap: 9 (ref 5–15)
BUN: 11 mg/dL (ref 6–20)
CO2: 26 mmol/L (ref 22–32)
Calcium: 9.7 mg/dL (ref 8.9–10.3)
Chloride: 103 mmol/L (ref 98–111)
Creatinine, Ser: 0.57 mg/dL (ref 0.44–1.00)
GFR, Estimated: >60 mL/min (ref >60)
Glucose, Bld: 155 mg/dL — ABNORMAL HIGH (ref 70–99)
Potassium: 4.1 mmol/L (ref 3.5–5.1)
Sodium: 138 mmol/L (ref 135–145)
Total Bilirubin: 0.6 mg/dL (ref 0.3–1.2)
Total Protein: 7.8 g/dL (ref 6.5–8.1)

## 2023-08-31 LAB — LIPASE, BLOOD: Lipase: 14 U/L (ref 11–51)

## 2023-08-31 MED ORDER — OXYCODONE HCL 5 MG PO TABS
2.5000 mg | ORAL_TABLET | ORAL | 0 refills | Status: AC | PRN
Start: 2023-08-31 — End: ?
  Filled 2023-08-31: qty 50, 9d supply, fill #0

## 2023-08-31 MED ORDER — SODIUM CHLORIDE 0.9 % IV SOLN: Freq: Once | INTRAVENOUS | Status: AC

## 2023-08-31 MED ORDER — OXYCODONE HCL 5 MG PO TABS: 5.0000 mg | ORAL_TABLET | Freq: Four times a day (QID) | ORAL | 0 refills | Status: DC | PRN

## 2023-08-31 MED ORDER — IOHEXOL 300 MG/ML  SOLN
100.0000 mL | Freq: Once | INTRAMUSCULAR | Status: AC | PRN
  Administered 2023-08-31: 90 mL via INTRAVENOUS

## 2023-08-31 MED ORDER — HYDROMORPHONE HCL 1 MG/ML IJ SOLN
0.5000 mg | Freq: Once | INTRAMUSCULAR | Status: AC
  Administered 2023-08-31: 0.5 mg via INTRAVENOUS
  Filled 2023-08-31: qty 1

## 2023-08-31 NOTE — ED Notes (Signed)
Reviewed discharge instructions, medications, and home care with pt. Pt verbalized understanding and had no further questions.

## 2023-08-31 NOTE — Progress Notes (Signed)
[facility] OFFICE PROGRESS NOTE   Diagnosis: Anal cancer  INTERVAL HISTORY:   Ms. Barsky is here for an unscheduled visit.  She reports the onset of right abdominal pain 08/25/2023.  The pain has progressed in intensity.  She presented to the emergency room earlier today.  She received a dose of IV Dilaudid.  The pain was partially relieved. She reports no fever, nausea, or difficulty with bowel function.  Objective:  Vital signs in last 24 hours:  Blood pressure (!) 167/95, pulse 87, temperature 98 F (36.7 C), temperature source Skin, resp. rate 18, height 5\' 7"  (1.702 m), weight 175 lb 11.2 oz (79.7 kg), last menstrual period 03/13/2012, SpO2 100%.     Lymphatics: No cervical, supraclavicular, axillary, or inguinal nodes Resp: Lungs clear bilaterally Cardio: Regular rate and rhythm GI: Left lower quadrant colostomy, no hepatosplenomegaly, no mass, mild tenderness in the right mid and lower abdomen Vascular: No leg edema  Skin: Perineal scar without evidence of recurrent tumor   Lab Results:  Lab Results  Component Value Date   WBC 6.6 08/31/2023   HGB 14.9 08/31/2023   HCT 43.4 08/31/2023   MCV 99.5 08/31/2023   PLT 146 (L) 08/31/2023   NEUTROABS 10.9 (H) 12/03/2021    CMP  Lab Results  Component Value Date   NA 138 08/31/2023   K 4.1 08/31/2023   CL 103 08/31/2023   CO2 26 08/31/2023   GLUCOSE 155 (H) 08/31/2023   BUN 11 08/31/2023   CREATININE 0.57 08/31/2023   CALCIUM 9.7 08/31/2023   PROT 7.8 08/31/2023   ALBUMIN 4.6 08/31/2023   AST 110 (H) 08/31/2023   ALT 188 (H) 08/31/2023   ALKPHOS 84 08/31/2023   BILITOT 0.6 08/31/2023   GFRNONAA >60 08/31/2023   GFRAA 106 11/29/2020    No results found for: "CEA1", "CEA", "CAN199", "CA125"  Lab Results  Component Value Date   INR 1.1 12/27/2022   LABPROT 13.9 12/27/2022    Imaging:  CT ABDOMEN PELVIS W CONTRAST  Result Date: 08/31/2023 CLINICAL DATA:  Right upper quadrant abdominal  pain with spasms and palpable knot. Ostomy patient. 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 * Tracking Code: BO * EXAM: CT ABDOMEN AND PELVIS WITH CONTRAST TECHNIQUE: Multidetector CT imaging of the abdomen and pelvis was performed using the standard protocol following bolus administration of intravenous contrast. RADIATION DOSE REDUCTION: This exam was performed according to the departmental dose-optimization program which includes automated exposure control, adjustment of the mA and/or kV according to patient size and/or use of iterative reconstruction technique. CONTRAST:  90mL OMNIPAQUE IOHEXOL 300 MG/ML  SOLN COMPARISON:  Abdominopelvic CT 07/20/2023 and 12/04/2022. PET-CT 12/21/2022. FINDINGS: Lower chest: Stable postsurgical changes from right lower lobe resection. The lung bases are clear. Trace residual pleural fluid on the right. Hepatobiliary: The liver is normal in density without suspicious focal abnormality. No evidence of gallstones, gallbladder wall thickening or biliary dilatation. Pancreas: Unremarkable. No pancreatic ductal dilatation or surrounding inflammatory changes. Spleen: Normal in size without focal abnormality. Adrenals/Urinary Tract: Both adrenal glands appear normal. No evidence of urinary tract calculus, suspicious renal lesion or hydronephrosis. The bladder appears unremarkable for its degree of distention. Stomach/Bowel: No enteric contrast administered. Stable postsurgical changes from abdominal perineal resection and diverting descending colostomy. The stomach appears unremarkable for its degree of distension. No evidence of bowel wall thickening, distention or surrounding inflammatory change. There is a moderate amount of stool throughout the colon. Vascular/Lymphatic: There are no  enlarged abdominal or pelvic lymph nodes. Mild aortic and branch vessel atherosclerosis without evidence of aneurysm or large vessel occlusion. The portal, superior  mesenteric and splenic veins are patent. Reproductive: Status post hysterectomy. The vaginal cuff appears stable. No adnexal mass. Other: Soft tissue nodule within the right upper quadrant anterior abdominal wall measures 2.3 x 1.9 cm on axial image 37/2 and 2.4 cm craniocaudal on coronal image 74/5, highly suspicious for metastatic disease. No other abdominal wall masses are identified. There is no ascites or peritoneal nodularity. Presacral fibrotic changes are stable. Musculoskeletal: No acute or significant osseous findings. Unless specific follow-up recommendations are mentioned in the findings or impression sections, no imaging follow-up of any mentioned incidental findings is recommended. IMPRESSION: 1. Heterogeneous soft tissue nodule within the right upper quadrant anterior abdominal wall, highly suspicious for metastatic disease. This should be amenable to percutaneous biopsy. 2. No other evidence of metastatic disease within the abdomen or pelvis. 3. No definite acute findings. 4. Stable postsurgical changes from right lower lobe lung resection, abdominal perineal resection and diverting descending colostomy. 5.  Aortic Atherosclerosis (ICD10-I70.0). Electronically Signed   By: Carey Bullocks M.D.   On: 08/31/2023 13:33    Medications: I have reviewed the patient's current medications.   Assessment/Plan: Squamous cell carcinoma of the anal canal Colonoscopy 01/28/2021-rectal mass extending to the "outside", palpable on exam-biopsy revealed high-grade squamous intraepithelial lesion (AIN 3/carcinoma in situ) Exam under anesthesia with transanal excision of an anal canal mass 02/25/2021-frozen section consistent with squamous cell carcinoma, 4 x 4 centimeter sessile anal canal mass extending to the anal verge, right lateral and posterior, 30% circumferential, fixed to the anal sphincter complex; anal rectal mass with mixed high-grade neuroendocrine squamous cell carcinoma; posterior anal tag with  squamous cell carcinoma; left labial lesion-condyloma acuminata CTs 03/07/2021-ill-definition of tissue planes along the anus with abnormal soft tissue prominence posteriorly along the anal canal suspicious for mass.  Small adjacent lower perirectal lymph nodes in addition to a 0.5 cm sacral node.  Focal enhancing lesion in the right hepatic lobe 1.2 x 0.8 x 0.9 cm.  2.1 x 1.5 cm cystic lesion right ovary with thin enhancing rim. PET scan 03/16/2021-hypermetabolic mass within the anal canal.  No definite evidence of local or distant metastatic disease.  7 mm left submandibular lymph node with mild FDG uptake, likely reactive.  Focal site of FDG uptake within the inner quadrant of the right breast. Radiation 03/21/2021-04/27/2021 Cycle one 5-FU/mitomycin-C 03/21/2021 Cycle two 5-FU/Mitomycin-C 04/18/2021, 5-FU dose reduced secondary to mucositis Anoscopy by Dr. Michaell Cowing 07/20/2021-divet in the right posterior lateral aspect with some right anterior and right lateral folds consistent with radiation treatment.  No ulceration.  Not fully resolved but markedly shrunk down. Anorectal mass 09/30/2021 examination under anesthesia, biopsy-poorly differentiated carcinoma-basaloid squamous carcinoma and high-grade neuroendocrine carcinoma, margins positive APR/and vertical rectus abdominis flap 11/03/2021, invasive poorly differentiated carcinoma, negative resection margins, 0/10 nodes, treatment response score 2,ypT1,ypN0 CT abdomen/pelvis 12/03/2021-high-grade small bowel obstruction transitioning in the left lower abdomen.  Presacral rim-enhancing 4.4 x 2.8 x 3.8 cm fluid collection.  No air in the collection.  Prior study described a potential abnormality in the right lobe of the liver, not seen on current study.  Cystic lesion of the right ovary resolved. CT chest 12/07/2021-interval development of bilateral trace pleural effusions. CTs 12/04/2022-new 2.6 x 2.5 cm right lower lobe nodule PET 12/21/2022-FDG avid right lower  lobe nodule, no other evidence of metastatic disease 12/29/2022 right lower lobectomy, lymph node dissection-poorly differentiated  carcinoma consistent with metastasis, 4.8 x 2.9 x 2.6 cm, margins free, 7 lymph nodes negative for carcinoma; cytohistomorphology correlation supports the current tumor being a metastasis from the previously diagnosed anal carcinoma CT chest 04/17/2023-interval right lower lobectomy with resection of right lung mass, pleural thickening and small right pleural effusion, no new mass or lymph node enlargement CTs 07/20/2023-diminished postoperative right pleural effusion.  Unchanged postoperative findings status post abdominoperineal resection with left lower quadrant and colostomy.  No evidence of metastatic disease. CT abdomen/pelvis 08/31/2023-heterogenous soft tissue nodule in the right upper quadrant anterior abdominal wall, no evidence of metastatic disease   Remote history of an anal fissure repair Tobacco use Moderate alcohol use Eczema Right breast mass- PET scan 03/16/2021 with focal FDG activity in the inner right breast Mammogram and right breast ultrasound 03/28/2021- negative Bilateral breast MRI 03/31/2021- 7 mm mass in the upper inner right breast correlating with the PET findings, no other abnormality, no adenopathy MRI guided biopsy of right breast mass 04/11/2021- fibrocystic change with sclerosing adenosis, small focus of inflammation/fibrosis suggestive of resolving fat necrosis, no evidence of malignancy--repeat bilateral breast MRI at a 5-month interval; bilateral breast MRI on 10/13/2021-no evidence of breast malignancy.  Annual screening mammography recommended.   7.  Admission with small bowel obstruction 12/03/2021 status post exploratory laparotomy and lysis of adhesions 8.  Right abdominal pain/tenderness-potentially related to the right abdominal wall mass noted on CT 08/31/2023 9.  Elevated liver enzymes-chronic, etiology  unclear    Disposition: [name] has a history of anal cancer.  She presents today with new onset right abdominal pain.  A CT abdomen reveals a right anterior abdominal wall mass.  It is unclear whether the pain is related to the mass or another etiology.  The liver enzymes are elevated, but this has been a chronic finding.  There is no other apparent explanation for the pain.  I reviewed the CT findings and images with Ms. Trammel.  We discussed the differential diagnosis including the possibility of progressive anal carcinoma versus an infection.  She will be referred for an ultrasound-guided biopsy of the abdominal wall mass.  She will return for an office visit after the biopsy.  She will try oxycodone for pain not relieved with Tylenol and ibuprofen.  She will call for increased pain.  Her case will be presented at the GI tumor conference to discuss treatment options if a diagnosis of recurrent anal cancer is confirmed.    Thornton Papas, MD  08/31/2023  3:27 PM

## 2023-08-31 NOTE — Discharge Instructions (Addendum)
As discussed, there is a mass in your right upper abdominal wall concerning for metastatic disease.  This is not definitive result which should be followed up with oncology in the outpatient setting for biopsy and further assessment.  Your liver function was also slightly elevated which should be retested.  Please do not hesitate to return to emergency department if there are worrisome signs and symptoms we discussed to become apparent.

## 2023-08-31 NOTE — ED Triage Notes (Addendum)
Pt arrived POV from home, caox4, ambulatory c/o RUQ abd pain described as constant, "like a knot at times, and like a spasm at times." Pt denies N/V/D, fever/chills, urinary s/s, or any additional complaint. Ostomy d/t hx cancer, pt states she has not noticed any issues with the ostomy.

## 2023-08-31 NOTE — Progress Notes (Signed)
Orders entered for CT ultrasound guided biopsy for upper right abdominal anterior wall nodule.  Received message that it is in review by IR

## 2023-08-31 NOTE — ED Provider Notes (Signed)
[place] [facility]ERGENC[facility] DEPARTMENT AT [facility] Provider Note   CSN: [id number] Arrival date & time: 08/31/23  1002     History  Chief Complaint  Patient presents with   Abdominal Pain    Beth [name] is a 52 y.o. female.   Abdominal Pain   52 year old female presents emergency department with complaints of abdominal pain.  Patient states she has been having right-sided abdominal pain for the past 7 to 10 days.  Reports cramping type sensation without radiation and feeling as if "have knots in abdomen."  Patient with history of anal cancer with ostomy placed.  Does states she has a history of SBO and feels somewhat similar.  Denies any fever, chills, nausea, vomiting, urinary symptoms, vaginal symptoms, change in ostomy output.  Past medical history significant for anal cancer, chronic constipation, SBO, colon cancer, migraine, diabetes mellitus  Home Medications Prior to Admission medications   Medication Sig Start Date End Date Taking? Authorizing Provider  oxyCODONE (ROXICODONE) 5 MG immediate release tablet Take 1 tablet (5 mg total) by mouth every 6 (six) hours as needed for severe pain or breakthrough pain. Patient not taking: Reported on 08/31/2023 08/31/23  Yes Sherian Maroon A, PA  acetaminophen (TYLENOL) 500 MG tablet Take 1,000 mg by mouth every 6 (six) hours as needed (for pain.).    [provider]  cetirizine (ZYRTEC) 10 MG tablet Take 10 mg by mouth in the morning.    [provider]  clobetasol ointment (TEMOVATE) 0.05 % Apply 1 Application topically at bedtime. Apply to palms at bedtime and cover with gloves    [provider]  docusate sodium (COLACE) 100 MG capsule Take 100 mg by mouth daily as needed for mild constipation (in the afternoon for regularity).    [provider]  gabapentin (NEURONTIN) 300 MG capsule Take 300 mg by mouth 3 (three) times daily. Takes w/600 mg capsule to equal 900 mg 04/06/23   [provider]  gabapentin (NEURONTIN) 600 MG tablet Take 900 mg by mouth in the morning, at noon, and at bedtime. Morning, afternoon & night 09/09/22   [provider]  glipiZIDE (GLUCOTROL XL) 2.5 MG 24 hr tablet Take 2.5 mg by mouth every morning. 09/08/22   [provider]  hydrOXYzine (ATARAX) 25 MG tablet Take 25 mg by mouth at bedtime. 07/11/22   [provider]  ibuprofen (ADVIL) 200 MG tablet Take 400 mg by mouth every 8 (eight) hours as needed (for pain.).    [provider]  polyethylene glycol (MIRALAX / GLYCOLAX) 17 g packet Take 17 g by mouth in the morning.    [provider]  SEMGLEE, YFGN, 100 UNIT/ML Pen Inject 8 Units into the skin every evening. 09/13/22   [provider]  spironolactone (ALDACTONE) 100 MG tablet Take 100 mg by mouth daily in the afternoon.    [provider]  triamcinolone cream (KENALOG) 0.5 % Apply 1 Application topically 2 (two) times daily as needed (skin irritation.).    [provider]  valACYclovir (VALTREX) 500 MG tablet Take 500 mg by mouth in the morning. 08/10/22   [provider]      Allergies    Clindamycin/lincomycin and Sulfa drugs cross reactors    Review of Systems   Review of Systems  Gastrointestinal:  Positive for abdominal pain.  All other systems reviewed and are negative.   Physical Exam Updated Vital Signs BP (!) 177/92   Pulse 79   Temp  98.6 F (37 C) (Oral)   Resp 12   Ht 5\' 7"  (1.702 m)   Wt 79.8 kg   LMP 03/13/2012   SpO2 100%   BMI 27.57 kg/m  Physical Exam Vitals and nursing note reviewed.  Constitutional:      General: She is not in acute distress.    Appearance: She is well-developed.  HENT:     Head: Normocephalic and atraumatic.  Eyes:     Conjunctiva/sclera: Conjunctivae normal.  Cardiovascular:     Rate and Rhythm: Normal rate and regular rhythm.     Heart sounds: No murmur heard. Pulmonary:     Effort: Pulmonary effort  is normal. No respiratory distress.     Breath sounds: Normal breath sounds.  Abdominal:     Palpations: Abdomen is soft.     Tenderness: There is abdominal tenderness. There is no right CVA tenderness, left CVA tenderness or guarding. Negative signs include Murphy's sign and McBurney's sign.     Comments: Right upper/middle abdominal tenderness to palpation.    Musculoskeletal:        General: No swelling.     Cervical back: Neck supple.  Skin:    General: Skin is warm and dry.     Capillary Refill: Capillary refill takes less than 2 seconds.  Neurological:     Mental Status: She is alert.  Psychiatric:        Mood and Affect: Mood normal.     ED Results / Procedures / Treatments   Labs (all labs ordered are listed, but only abnormal results are displayed) Labs Reviewed  COMPREHENSIVE METABOLIC PANEL - Abnormal; Notable for the following components:      Result Value   Glucose, Bld 155 (*)    AST 110 (*)    ALT 188 (*)    All other components within normal limits  CBC - Abnormal; Notable for the following components:   MCH 34.2 (*)    Platelets 146 (*)    All other components within normal limits  URINALYSIS, ROUTINE W REFLEX MICROSCOPIC - Abnormal; Notable for the following components:   Protein, ur TRACE (*)    All other components within normal limits  LIPASE, BLOOD    EKG EKG Interpretation Date/Time:  Friday August 31 2023 11:31:29 EDT Ventricular Rate:  83 PR Interval:  164 QRS Duration:  87 QT Interval:  377 QTC Calculation: 443 R Axis:   38  Text Interpretation: Sinus rhythm Abnormal R-wave progression, early transition No significant change since prior 1/24 Confirmed by Meridee Score [phone]) on 08/31/2023 11:32:39 AM  Radiology CT ABDOMEN PELVIS W CONTRAST  Result Date: 08/31/2023 CLINICAL DATA:  Right upper quadrant abdominal pain with spasms and palpable knot. Ostomy patient. 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 * Tracking Code: BO * EXAM: CT ABDOMEN AND PELVIS WITH CONTRAST TECHNIQUE: Multidetector CT imaging of the abdomen and pelvis was performed using the standard protocol following bolus administration of intravenous contrast. RADIATION DOSE REDUCTION: This exam was performed according to the departmental dose-optimization program which includes automated exposure control, adjustment of the mA and/or kV according to patient size and/or use of iterative reconstruction technique. CONTRAST:  90mL OMNIPAQUE IOHEXOL 300 MG/ML  SOLN COMPARISON:  Abdominopelvic CT 07/20/2023 and 12/04/2022. PET-CT 12/21/2022. FINDINGS: Lower chest: Stable postsurgical changes from right lower lobe resection. The lung bases are clear. Trace residual pleural fluid on the right. Hepatobiliary: The liver is normal in density without suspicious focal abnormality. No evidence  of gallstones, gallbladder wall thickening or biliary dilatation. Pancreas: Unremarkable. No pancreatic ductal dilatation or surrounding inflammatory changes. Spleen: Normal in size without focal abnormality. Adrenals/Urinary Tract: Both adrenal glands appear normal. No evidence of urinary tract calculus, suspicious renal lesion or hydronephrosis. The bladder appears unremarkable for its degree of distention. Stomach/Bowel: No enteric contrast administered. Stable postsurgical changes from abdominal perineal resection and diverting descending colostomy. The stomach appears unremarkable for its degree of distension. No evidence of bowel wall thickening, distention or surrounding inflammatory change. There is a moderate amount of stool throughout the colon. Vascular/Lymphatic: There are no enlarged abdominal or pelvic lymph nodes. Mild aortic and branch vessel atherosclerosis without evidence of aneurysm or large vessel occlusion. The portal, superior mesenteric and splenic veins are patent. Reproductive: Status post hysterectomy. The vaginal cuff appears stable. No  adnexal mass. Other: Soft tissue nodule within the right upper quadrant anterior abdominal wall measures 2.3 x 1.9 cm on axial image 37/2 and 2.4 cm craniocaudal on coronal image 74/5, highly suspicious for metastatic disease. No other abdominal wall masses are identified. There is no ascites or peritoneal nodularity. Presacral fibrotic changes are stable. Musculoskeletal: No acute or significant osseous findings. Unless specific follow-up recommendations are mentioned in the findings or impression sections, no imaging follow-up of any mentioned incidental findings is recommended. IMPRESSION: 1. Heterogeneous soft tissue nodule within the right upper quadrant anterior abdominal wall, highly suspicious for metastatic disease. This should be amenable to percutaneous biopsy. 2. No other evidence of metastatic disease within the abdomen or pelvis. 3. No definite acute findings. 4. Stable postsurgical changes from right lower lobe lung resection, abdominal perineal resection and diverting descending colostomy. 5.  Aortic Atherosclerosis (ICD10-I70.0). Electronically Signed   By: Carey Bullocks M.D.   On: 08/31/2023 13:33    Procedures Procedures    Medications Ordered in ED Medications  HYDROmorphone (DILAUDID) injection 0.5 mg (0.5 mg Intravenous Given 08/31/23 1135)  0.9 %  sodium chloride infusion ( Intravenous New Bag/Given 08/31/23 1138)  iohexol (OMNIPAQUE) 300 MG/ML solution 100 mL (90 mLs Intravenous Contrast Given 08/31/23 1119)    ED Course/ Medical Decision Making/ A&P Clinical Course as of 08/31/23 1503  Fri Aug 31, 2023  1458 Consulted patient's oncologist Dr. Myrle Sheng who recommended outpatient biopsy. [CR]    Clinical Course User Index [CR] Peter Garter, PA                                 Medical Decision Making Amount and/or Complexity of Data Reviewed Labs: ordered. Radiology: ordered.  Risk Prescription drug management.   This patient presents to the ED for concern of  abdominal pain, this involves an extensive number of treatment options, and is a complaint that carries with it a high risk of complications and morbidity.  The differential diagnosis includes SBO/LBO, volvulus, cholecystitis, CBD pathology, diverticulitis, appendicitis, pancreatitis, gastritis, ovarian torsion, ovarian mass, malignancy, other   Co morbidities that complicate the patient evaluation  See HPI   Additional history obtained:  Additional history obtained from EMR External records from outside source obtained and reviewed including hospital records   Lab Tests:  I Ordered, and personally interpreted labs.  The pertinent results include: No leukocytosis.  No evidence of anemia.  Patient with thrombocytopenia with platelets of 146 oh which is near patient's baseline.  UA significant for trace protein but otherwise unremarkable.  No electrolyte abnormalities.  Mild transaminitis with an AST of  110 and ALT of 188.  No renal dysfunction.   Imaging Studies ordered:  I ordered imaging studies including CT abdomen pelvis I independently visualized and interpreted imaging which showed heterogeneous soft tissue nodule right upper quadrant anterior abdominal wall.  No other metastatic disease.  No acute finding.  Postsurgical changes from right lower lung as well as perineal resection.  Aortic atherosclerosis. I agree with the radiologist interpretation  Cardiac Monitoring: / EKG:  The patient was maintained on a cardiac monitor.  I personally viewed and interpreted the cardiac monitored which showed an underlying rhythm of: Sinus rhythm   Consultations Obtained:  See ED course  Problem List / ED Course / Critical interventions / Medication management  RUQ abdominal pain I ordered medication including dilaudid, normal saline   Reevaluation of the patient after these medicines showed that the patient improved I have reviewed the patients home medicines and have made adjustments  as needed   Social Determinants of Health:  Former cigarette use.  Denies illicit drug use.   Test / Admission - Considered:  Abdominal pain Vitals signs significant for hypertension blood pressure 173/97. Otherwise within normal range and stable throughout visit. Laboratory/imaging studies significant for: See above 52 year old female presents emergency department with complaints of right sided abdominal pain.  Patient's abdominal pain present for the past 7 to 10 days.  Patient fever, nausea, vomiting, change in ostomy output with no exacerbation of symptoms with consumption of food.  On exam, patient with some right-sided abdominal pain seems to be right middle/upper abdominal tenderness with possible palpable mass upon repeat evaluation.  Patient's laboratory studies grossly reassuring but with some nonspecific transaminitis to be followed with primary care in the outpatient setting.  CT imaging was obtained given patient's extensive history of malignancy as well as SBO.  CT imaging did show mass on the right side of the abdomen concerning for metastatic disease which seems to be over patient's symptoms most tender on exam.  Repeat evaluation after medications administered to the patient without any pain.  Consulted patient's oncologist who recommended outpatient biopsy.  Will recommend symptomatic therapy as described in AVS.  Treatment plan discussed with patient and she acknowledged understanding was agreeable to said plan.  Patient overall well-appearing, afebrile in no acute distress. Worrisome signs and symptoms were discussed with the patient and the patient acknowledged understanding to return to emergency department if notice.  Patient stable upon discharge.         Final Clinical Impression(s) / ED Diagnoses Final diagnoses:  Abdominal wall mass  Transaminitis    Rx / DC Orders ED Discharge Orders          Ordered    oxyCODONE (ROXICODONE) 5 MG immediate release tablet   Every 6 hours PRN        08/31/23 1410              Peter Garter, [place] 08/31/23 1503    Terrilee Files, MD 08/31/23 1735

## 2023-09-03 ENCOUNTER — Encounter: Payer: Self-pay | Admitting: General Practice

## 2023-09-03 NOTE — Progress Notes (Unsigned)
Simonne Come, MD  Caroleen Hamman, NT OK for CT guided omental nodule Bx.  CT AP - image 37, series 2.  (I would localize with CT and stick with [place] with pt on CT gantry).  Nedra Hai       Previous Messages    ----- Message ----- From: Caroleen Hamman, NT Sent: 08/31/2023   3:18 PM EDT To: Ir Procedure Requests Subject: CT US GUIDED BIOPSY                            Procedure: CT US GUIDED BIOPSY  Reason: Hx anal cancer now with right side abdominal mass on CT 08/31/23, pt is symptomatic. - Per report from Dr Purcell Mouton "This should be amenable to percutaneous biopsy" Dx: Anal squamous cell carcinoma  History: CT and PET in chart  Provider: Ladene Artist, MD  Contact: [phone]

## 2023-09-06 ENCOUNTER — Other Ambulatory Visit (HOSPITAL_COMMUNITY): Payer: Self-pay | Admitting: Student

## 2023-09-06 DIAGNOSIS — C211 Malignant neoplasm of anal canal: Secondary | ICD-10-CM

## 2023-09-06 NOTE — H&P (Addendum)
Chief Complaint: Patient was seen in consultation today for image guided omental nodule biopsy  Referring Physician(s): Thornton Papas B  Supervising Physician: Marliss Coots  Patient Status: [facility] - Out-pt  History of Present Illness: Beth [name] is a 52 y.o. female ex smoker  known to IR team from PICC placement 2022. She has a PMH sig for DM,migraines, HSV and anal cancer 2022 with prior surgery/chemoradiation. Recent f/u CT A/P on 08/31/23 revealed:  1. Heterogeneous soft tissue nodule within the right upper quadrant anterior abdominal wall, highly suspicious for metastatic disease. This should be amenable to percutaneous biopsy. 2. No other evidence of metastatic disease within the abdomen or pelvis. 3. No definite acute findings. 4. Stable postsurgical changes from right lower lobe lung resection, abdominal perineal resection and diverting descending colostomy. 5.  Aortic Atherosclerosis  She presents today for image guided biopsy of omental nodule.   Past Medical History:  Diagnosis Date   Acne    on face spirolactone for   Anal cancer (HCC) 02/25/2021   Constipation, chronic 08/08/2016   Diabetes mellitus without complication (HCC) 08/2022   Type II   Family history of adverse reaction to anesthesia    Mother has trouble waking takes longer   Herpes simplex    History of migraine    none in years   Wears glasses     Past Surgical History:  Procedure Laterality Date   ABDOMINAL HYSTERECTOMY  2016   partial   COLONOSCOPY     COLONOSCOPY WITH PROPOFOL N/A 01/28/2021   Procedure: COLONOSCOPY WITH PROPOFOL;  Surgeon: Pasty Spillers, MD;  Location: [facility] ENDOSCOPY;  Service: Endoscopy;  Laterality: N/A;   EXCISION HYDRADENITIS LABIA Left 02/25/2021   Procedure: EXCISION of  LABIAL LESIOM;  Surgeon: Karie Soda, MD;  Location: [facility] [place];  Service: General;  Laterality: Left;   INTERCOSTAL NERVE BLOCK  12/29/2022   Procedure: INTERCOSTAL  NERVE BLOCK;  Surgeon: Loreli Slot, MD;  Location: [facility] OR;  Service: Thoracic;;   IR FLUORO GUIDE CV LINE RIGHT  04/18/2021   LAPAROTOMY N/A 12/03/2021   Procedure: EXPLORATORY LAPAROTOMY, LYSIS OF ADHESIONS;  Surgeon: Axel Filler, MD;  Location: [facility] OR;  Service: General;  Laterality: N/A;   LYMPH NODE DISSECTION Right 12/29/2022   Procedure: LYMPH NODE DISSECTION;  Surgeon: Loreli Slot, MD;  Location: [facility] OR;  Service: Thoracic;  Laterality: Right;   OSTOMY N/A 11/03/2021   Procedure: END COLOSTOMY;  Surgeon: Karie Soda, MD;  Location: [facility] ORS;  Service: General;  Laterality: N/A;   PECTORALIS FLAP N/A 11/03/2021   Procedure: VERTICAL RECTUS ABDOMINIS MYOCUTANEOUS TO PERINEUM;  Surgeon: Glenna Fellows, MD;  Location: [facility] OR[facility];  Service: Plastics;  Laterality: N/A;   RECTAL BIOPSY N/A 09/30/2021   Procedure: ANORECTAL MASS BIOPSY;  Surgeon: Karie Soda, MD;  Location: [facility];  Service: General;  Laterality: N/A;   RECTAL EXAM UNDER ANESTHESIA N/A 02/25/2021   Procedure: ANORECTAL EXAM UNDER ANESTHESIA;  Surgeon: Karie Soda, MD;  Location: [facility];  Service: General;  Laterality: N/A;   RECTAL EXAM UNDER ANESTHESIA N/A 09/30/2021   Procedure: ANORECTAL EXAMINATION UNDER ANESTHESIA;  Surgeon: Karie Soda, MD;  Location: [facility];  Service: General;  Laterality: N/A;  GEN & LOCAL ANESTHESIA   SPHINCTEROTOMY N/A 08/30/2016   Procedure: LATERAL INTERNAL AND SPHINCTEROTOMY;  Surgeon: Karie Soda, MD;  Location: [facility];  Service: General;  Laterality: N/A;   TONSILLECTOMY  as child   TRANSANAL EXCISION OF RECTAL MASS  N/A 02/25/2021   Procedure: TRANSANAL EXCISIONAL  BIOPSY OF ANAL CANAL MASS;  Surgeon: Karie Soda, MD;  Location: [facility];  Service: General;  Laterality: N/A;   XI ROBOT ABDOMINAL PERINEAL RESECTION N/A 11/03/2021   Procedure: XI ROBOT ABDOMINOPERINEAL RESECTION;  Surgeon: Karie Soda, MD;  Location: [facility];  Service:  General;  Laterality: N/A;    Allergies: Clindamycin/lincomycin and Sulfa drugs cross reactors  Medications: Prior to Admission medications   Medication Sig Start Date End Date Taking? Authorizing Provider  acetaminophen (TYLENOL) 500 MG tablet Take 1,000 mg by mouth every 6 (six) hours as needed (for pain.).    [provider]  cetirizine (ZYRTEC) 10 MG tablet Take 10 mg by mouth in the morning.    [provider]  clobetasol ointment (TEMOVATE) 0.05 % Apply 1 Application topically at bedtime. Apply to palms at bedtime and cover with gloves    [provider]  docusate sodium (COLACE) 100 MG capsule Take 100 mg by mouth daily as needed for mild constipation (in the afternoon for regularity).    [provider]  gabapentin (NEURONTIN) 300 MG capsule Take 300 mg by mouth 3 (three) times daily. Takes w/600 mg capsule to equal 900 mg 04/06/23   [provider]  gabapentin (NEURONTIN) 600 MG tablet Take 900 mg by mouth in the morning, at noon, and at bedtime. Morning, afternoon & night 09/09/22   [provider]  glipiZIDE (GLUCOTROL XL) 2.5 MG 24 hr tablet Take 2.5 mg by mouth every morning. 09/08/22   [provider]  hydrOXYzine (ATARAX) 25 MG tablet Take 25 mg by mouth at bedtime. 07/11/22   [provider]  ibuprofen (ADVIL) 200 MG tablet Take 400 mg by mouth every 8 (eight) hours as needed (for pain.).    [provider]  oxyCODONE (OXY IR/ROXICODONE) 5 MG immediate release tablet Take 0.5-1 tablets (2.5-5 mg total) by mouth every 4 (four) hours as needed for severe pain. 08/31/23   Rana Snare, NP  polyethylene glycol (MIRALAX / GLYCOLAX) 17 g packet Take 17 g by mouth in the morning.    [provider]  SEMGLEE, YFGN, 100 UNIT/ML Pen Inject 8 Units into the skin every evening. 09/13/22   [provider]  spironolactone (ALDACTONE) 100 MG tablet Take 100 mg by mouth daily in the afternoon.     [provider]  triamcinolone cream (KENALOG) 0.5 % Apply 1 Application topically 2 (two) times daily as needed (skin irritation.).    [provider]  valACYclovir (VALTREX) 500 MG tablet Take 500 mg by mouth in the morning. 08/10/22   [provider]     Family History  Problem Relation Age of Onset   Breast cancer Paternal Aunt     Social History   Socioeconomic History   Marital status: Married    Spouse name: Debroah Loop   Number of children: 3   Years of education: Not on file   Highest education level: Not on file  Occupational History   Not on file  Tobacco Use   Smoking status: Former    Current packs/day: 0.00    Average packs/day: 1 pack/day for 8.0 years (8.0 ttl pk-yrs)    Types: Cigarettes    Start date: 02/15/2013    Quit date: 02/15/2021    Years since quitting: 2.5   Smokeless tobacco: Never  Vaping Use   Vaping status: Never Used  Substance and Sexual Activity   Alcohol use: Yes  Alcohol/week: 14.0 standard drinks of alcohol    Types: 14 Shots of liquor per week    Comment: 2 dirnks daily   Drug use: No   Sexual activity: Yes    Birth control/protection: Surgical    Comment: Hysterectomy  Other Topics Concern   Not on file  Social History Narrative   Not on file   Social Determinants of Health   Financial Resource Strain: Not on file  Food Insecurity: Not on file  Transportation Needs: Not on file  Physical Activity: Inactive (11/12/2017)   Exercise Vital Sign    Days of Exercise per Week: 0 days    Minutes of Exercise per Session: 0 min  Stress: No Stress Concern Present (11/12/2017)   [employer] of Occupational Health - Occupational Stress Questionnaire    Feeling of Stress : Only a little  Social Connections: Somewhat Isolated (11/12/2017)   Social Connection and Isolation Panel [NHANES]    Frequency of Communication with Friends and Family: More than three times a week    Frequency of Social Gatherings with  Friends and Family: Once a week    Attends Religious Services: Never    Database administrator or Organizations: No    Attends Banker Meetings: Never    Marital Status: Married      Review of Systems:She denies fever,HA,CP,dyspnea, cough, N/V or bleeding. She does have occ abd/back discomfort.    Vital Signs: Vitals:   09/07/23 0728  BP: (!) 150/97  Pulse: 76  Resp: 17  Temp: 98 F (36.7 C)  SpO2: 98%    LMP 03/13/2012   Code Status: FULL CODE   Physical Exam: awake/alert; chest- CTA bilat; heart- RRR; abd- soft,+BS, some mild diffuse tenderness to palpation, intact left sided colostomy; no LE edema  Imaging: CT ABDOMEN PELVIS W CONTRAST  Result Date: 08/31/2023 CLINICAL DATA:  Right upper quadrant abdominal pain with spasms and palpable knot. Ostomy patient. 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 * Tracking Code: BO * EXAM: CT ABDOMEN AND PELVIS WITH CONTRAST TECHNIQUE: Multidetector CT imaging of the abdomen and pelvis was performed using the standard protocol following bolus administration of intravenous contrast. RADIATION DOSE REDUCTION: This exam was performed according to the departmental dose-optimization program which includes automated exposure control, adjustment of the mA and/or kV according to patient size and/or use of iterative reconstruction technique. CONTRAST:  90mL OMNIPAQUE IOHEXOL 300 MG/ML  SOLN COMPARISON:  Abdominopelvic CT 07/20/2023 and 12/04/2022. PET-CT 12/21/2022. FINDINGS: Lower chest: Stable postsurgical changes from right lower lobe resection. The lung bases are clear. Trace residual pleural fluid on the right. Hepatobiliary: The liver is normal in density without suspicious focal abnormality. No evidence of gallstones, gallbladder wall thickening or biliary dilatation. Pancreas: Unremarkable. No pancreatic ductal dilatation or surrounding inflammatory changes. Spleen: Normal in size without focal  abnormality. Adrenals/Urinary Tract: Both adrenal glands appear normal. No evidence of urinary tract calculus, suspicious renal lesion or hydronephrosis. The bladder appears unremarkable for its degree of distention. Stomach/Bowel: No enteric contrast administered. Stable postsurgical changes from abdominal perineal resection and diverting descending colostomy. The stomach appears unremarkable for its degree of distension. No evidence of bowel wall thickening, distention or surrounding inflammatory change. There is a moderate amount of stool throughout the colon. Vascular/Lymphatic: There are no enlarged abdominal or pelvic lymph nodes. Mild aortic and branch vessel atherosclerosis without evidence of aneurysm or large vessel occlusion. The portal, superior mesenteric and splenic veins are patent. Reproductive: Status post  hysterectomy. The vaginal cuff appears stable. No adnexal mass. Other: Soft tissue nodule within the right upper quadrant anterior abdominal wall measures 2.3 x 1.9 cm on axial image 37/2 and 2.4 cm craniocaudal on coronal image 74/5, highly suspicious for metastatic disease. No other abdominal wall masses are identified. There is no ascites or peritoneal nodularity. Presacral fibrotic changes are stable. Musculoskeletal: No acute or significant osseous findings. Unless specific follow-up recommendations are mentioned in the findings or impression sections, no imaging follow-up of any mentioned incidental findings is recommended. IMPRESSION: 1. Heterogeneous soft tissue nodule within the right upper quadrant anterior abdominal wall, highly suspicious for metastatic disease. This should be amenable to percutaneous biopsy. 2. No other evidence of metastatic disease within the abdomen or pelvis. 3. No definite acute findings. 4. Stable postsurgical changes from right lower lobe lung resection, abdominal perineal resection and diverting descending colostomy. 5.  Aortic Atherosclerosis (ICD10-I70.0).  Electronically Signed   By: Carey Bullocks M.D.   On: 08/31/2023 13:33    Labs:  CBC: Recent Labs    12/27/22 0823 12/30/22 0006 12/31/22 0020 08/31/23 1020  WBC 4.9 9.8 8.3 6.6  HGB 14.6 11.7* 13.3 14.9  HCT 44.9 35.1* 40.2 43.4  PLT 146* 108* 129* 146*    COAGS: Recent Labs    12/27/22 0823  INR 1.1  APTT 28    BMP: Recent Labs    12/30/22 0006 12/31/22 0020 07/20/23 0741 08/31/23 1020  NA 134* 135 138 138  K 4.0 3.7 4.2 4.1  CL 103 105 103 103  CO2 23 21* 28 26  GLUCOSE 179* 169* 147* 155*  BUN 11 11 10 11   CALCIUM 8.5* 8.4* 9.9 9.7  CREATININE 0.76 0.76 0.58 0.57  GFRNONAA >60 >60 >60 >60    LIVER FUNCTION TESTS: Recent Labs    12/27/22 0823 12/31/22 0020 08/31/23 1020  BILITOT 0.8 1.0 0.6  AST 181* 52* 110*  ALT 253* 84* 188*  ALKPHOS 71 59 84  PROT 7.8 6.3* 7.8  ALBUMIN 4.4 3.5 4.6    TUMOR MARKERS: No results for input(s): "AFPTM", "CEA", "CA199", "CHROMGRNA" in the last 8760 hours.  Assessment and Plan: 52 y.o. female ex smoker  known to IR team from PICC placement 2022. She has a PMH sig for DM,migraines, HSV and anal cancer 2022 with prior surgery/ chemoradiation. Recent f/u CT A/P on 08/31/23 revealed:  1. Heterogeneous soft tissue nodule within the right upper quadrant anterior abdominal wall, highly suspicious for metastatic disease. This should be amenable to percutaneous biopsy. 2. No other evidence of metastatic disease within the abdomen or pelvis. 3. No definite acute findings. 4. Stable postsurgical changes from right lower lobe lung resection, abdominal perineal resection and diverting descending colostomy. 5.  Aortic Atherosclerosis  She presents today for image guided biopsy of omental nodule. Risks and benefits of procedure was discussed with the patient  including, but not limited to bleeding, infection, damage to adjacent structures or low yield requiring additional tests.  All of the questions were answered and there  is agreement to proceed.  Consent signed and in chart.    Thank you for this interesting consult.  I greatly enjoyed meeting Lameka Dynes Grace and look forward to participating in their care.  A copy of this report was sent to the requesting provider on this date.  Electronically Signed: D. Jeananne Rama, PA-C 09/06/2023, 12:54 PM   I spent a total of  25 minutes   in face to face in clinical consultation,  greater than 50% of which was counseling/coordinating care for image guided omental nodule biopsy

## 2023-09-07 ENCOUNTER — Other Ambulatory Visit: Payer: Self-pay

## 2023-09-07 ENCOUNTER — Encounter (HOSPITAL_COMMUNITY): Payer: Self-pay

## 2023-09-07 ENCOUNTER — Ambulatory Visit (HOSPITAL_COMMUNITY)
Admission: RE | Admit: 2023-09-07 | Discharge: 2023-09-07 | Disposition: A | Discharge status: Home / Self Care | Payer: BC Managed Care – PPO | Source: Ambulatory Visit | Attending: Oncology | Admitting: Oncology

## 2023-09-07 DIAGNOSIS — C211 Malignant neoplasm of anal canal: Secondary | ICD-10-CM

## 2023-09-07 DIAGNOSIS — R229 Localized swelling, mass and lump, unspecified: Secondary | ICD-10-CM | POA: Diagnosis present

## 2023-09-07 DIAGNOSIS — Z85048 Personal history of other malignant neoplasm of rectum, rectosigmoid junction, and anus: Secondary | ICD-10-CM | POA: Insufficient documentation

## 2023-09-07 DIAGNOSIS — Z87891 Personal history of nicotine dependence: Secondary | ICD-10-CM | POA: Insufficient documentation

## 2023-09-07 DIAGNOSIS — C792 Secondary malignant neoplasm of skin: Secondary | ICD-10-CM | POA: Insufficient documentation

## 2023-09-07 DIAGNOSIS — I700 Atherosclerosis of aorta: Secondary | ICD-10-CM | POA: Diagnosis not present

## 2023-09-07 DIAGNOSIS — E119 Type 2 diabetes mellitus without complications: Secondary | ICD-10-CM | POA: Insufficient documentation

## 2023-09-07 DIAGNOSIS — Z933 Colostomy status: Secondary | ICD-10-CM | POA: Diagnosis not present

## 2023-09-07 DIAGNOSIS — C210 Malignant neoplasm of anus, unspecified: Secondary | ICD-10-CM | POA: Diagnosis not present

## 2023-09-07 DIAGNOSIS — Z452 Encounter for adjustment and management of vascular access device: Secondary | ICD-10-CM | POA: Diagnosis not present

## 2023-09-07 LAB — CBC
HCT: 42.6 % (ref 36.0–46.0)
Hemoglobin: 14.4 g/dL (ref 12.0–15.0)
MCH: 34.4 pg — ABNORMAL HIGH (ref 26.0–34.0)
MCHC: 33.8 g/dL (ref 30.0–36.0)
MCV: 101.7 fL — ABNORMAL HIGH (ref 80.0–100.0)
Platelets: 134 10*3/uL — ABNORMAL LOW (ref 150–400)
RBC: 4.19 MIL/uL (ref 3.87–5.11)
RDW: 12.6 % (ref 11.5–15.5)
WBC: 5.6 10*3/uL (ref 4.0–10.5)
nRBC: 0 % (ref 0.0–0.2)

## 2023-09-07 LAB — PROTIME-INR
INR: 1.1 (ref 0.8–1.2)
Prothrombin Time: 14.1 seconds (ref 11.4–15.2)

## 2023-09-07 LAB — GLUCOSE, CAPILLARY: Glucose-Capillary: 158 mg/dL — ABNORMAL HIGH (ref 70–99)

## 2023-09-07 MED ORDER — FENTANYL CITRATE (PF) 100 MCG/2ML IJ SOLN
INTRAMUSCULAR | Status: AC | PRN
Start: 2023-09-07 — End: 2023-09-07
  Administered 2023-09-07 (×2): 25 ug via INTRAVENOUS

## 2023-09-07 MED ORDER — FENTANYL CITRATE (PF) 100 MCG/2ML IJ SOLN
INTRAMUSCULAR | Status: AC
  Filled 2023-09-07: qty 2

## 2023-09-07 MED ORDER — MIDAZOLAM HCL 2 MG/2ML IJ SOLN
INTRAMUSCULAR | Status: AC | PRN
Start: 2023-09-07 — End: 2023-09-07
  Administered 2023-09-07 (×2): .5 mg via INTRAVENOUS

## 2023-09-07 MED ORDER — MIDAZOLAM HCL 2 MG/2ML IJ SOLN
INTRAMUSCULAR | Status: AC
  Filled 2023-09-07: qty 2

## 2023-09-07 MED ORDER — SODIUM CHLORIDE 0.9 % IV SOLN: INTRAVENOUS | Status: DC

## 2023-09-07 NOTE — Discharge Instructions (Signed)
Please call Interventional Radiology clinic [phone] with any questions or concerns.   You may remove your dressing and shower tomorrow.    Needle Biopsy, Care After These instructions tell you how to care for yourself after your procedure. Your doctor may also give you more specific instructions. Call your doctor if Beth [name] any problems or questions. What can I expect after the procedure? After the procedure, it is common to have: Soreness. Bruising. Mild pain. Follow these instructions at home:  Return to your normal activities as told by your doctor. Ask your doctor what activities are safe for you. Take over-the-counter and prescription medicines only as told by your doctor. Wash your hands with soap and water before you change your bandage (dressing). If you cannot use soap and water, use hand sanitizer. Follow instructions from your doctor about: How to take care of your puncture site. When and how to change your bandage. When to remove your bandage. Check your puncture site every day for signs of infection. Watch for: Redness, swelling, or pain. Fluid or blood. Pus or a bad smell. Warmth. Do not take baths, swim, or use a hot tub until your doctor approves. Ask your doctor if you may take showers. You may only be allowed to take sponge baths. Keep all follow-up visits as told by your doctor. This is important. Contact a doctor if you have: A fever. Redness, swelling, or pain at the puncture site, and it lasts longer than a few days. Fluid, blood, or pus coming from the puncture site. Warmth coming from the puncture site. Get help right away if: You have a lot of bleeding from the puncture site. Summary After the procedure, it is common to have soreness, bruising, or mild pain at the puncture site. Check your puncture site every day for signs of infection, such as redness, swelling, or pain. Get help right away if you have severe bleeding from your puncture  site. This information is not intended to replace advice given to you by your health care provider. Make sure you discuss any questions you have with your healthcare provider. Document Revised: 06/03/2020 Document Reviewed: 06/03/2020 Elsevier Patient Education  2022 [employer] Inc[employer]   Moderate Conscious Sedation, Adult, Care After This sheet gives you information about how to care for yourself after your procedure. Your health care provider may also give you more specific instructions. If you have problems or questions, contact your health care provider. What can I expect after the procedure? After the procedure, it is common to have: Sleepiness for several hours. Impaired judgment for several hours. Difficulty with balance. Vomiting if you eat too soon. Follow these instructions at home: For the time period you were told by your health care provider: Rest. Do not participate in activities where you could fall or become injured. Do not drive or use machinery. Do not drink alcohol. Do not take sleeping pills or medicines that cause drowsiness. Do not make important decisions or sign legal documents. Do not take care of children on your own.      Eating and drinking Follow the diet recommended by your health care provider. Drink enough fluid to keep your urine pale yellow. If you vomit: Drink water, juice, or soup when you can drink without vomiting. Make sure you have little or no nausea before eating solid foods.   General instructions Take over-the-counter and prescription medicines only as told by your health care provider. Have a responsible adult stay with you for the time you are told.  It is important to have someone help care for you until you are awake and alert. Do not smoke. Keep all follow-up visits as told by your health care provider. This is important. Contact a health care provider if: You are still sleepy or having trouble with balance after 24 hours. You feel  light-headed. You keep feeling nauseous or you keep vomiting. You develop a rash. You have a fever. You have redness or swelling around the IV site. Get help right away if: You have trouble breathing. You have new-onset confusion at home. Summary After the procedure, it is common to feel sleepy, have impaired judgment, or feel nauseous if you eat too soon. Rest after you get home. Know the things you should not do after the procedure. Follow the diet recommended by your health care provider and drink enough fluid to keep your urine pale yellow. Get help right away if you have trouble breathing or new-onset confusion at home. This information is not intended to replace advice given to you by your health care provider. Make sure you discuss any questions you have with your health care provider. Document Revised: 04/02/2020 Document Reviewed: 10/30/2019 Elsevier Patient Education  2021 [employer].

## 2023-09-07 NOTE — Procedures (Signed)
Interventional Radiology Procedure Note  Procedure: Ultrasound and CT guided abdominal mass biopsy  Findings: Please refer to procedural dictation for full description. 18 ga core x2 from right anterior abdominal wall soft tissue mass.  Complications: None immediate  Estimated Blood Loss: < 5 mL  Recommendations: 1 hour bedrest. Follow Pathology results.   Marliss Coots, MD

## 2023-09-13 ENCOUNTER — Encounter: Payer: Self-pay | Admitting: *Deleted

## 2023-09-13 ENCOUNTER — Inpatient Hospital Stay: Payer: BC Managed Care – PPO | Admitting: Oncology

## 2023-09-13 VITALS — BP 133/96 | HR 88 | Temp 98.1°F | Resp 18 | Ht 67.0 in | Wt 175.7 lb

## 2023-09-13 DIAGNOSIS — Z85048 Personal history of other malignant neoplasm of rectum, rectosigmoid junction, and anus: Secondary | ICD-10-CM | POA: Diagnosis present

## 2023-09-13 DIAGNOSIS — C210 Malignant neoplasm of anus, unspecified: Secondary | ICD-10-CM

## 2023-09-13 NOTE — Progress Notes (Signed)
PATIENT NAVIGATOR PROGRESS NOTE  Name: Beth [name] Date: 09/13/2023 MRN: [id number]  DOB: 12-16-1971   Reason for visit:  Molecular testing  Comments:  Tissue from recent biopsy sent for PD-1 testing and [employer] One testing on accession number [id number] requested    Time spent counseling/coordinating care: 15-30 minutes

## 2023-09-13 NOTE — Progress Notes (Signed)
Beth [name]   Diagnosis: Anal cancer  INTERVAL HISTORY:   Beth [name] returns as scheduled.  She continues to have pain in the right abdomen.  She takes oxycodone once daily.  She continues working. She was evaluated by Beth [name] on 09/11/2023 for evaluation of hematuria.  A cystoscopy revealed evidence of a urinary tract infection or radiation change.  No carcinoma was seen.  She was prescribed ciprofloxacin. She underwent a CT-guided biopsy of the right abdominal wall mass on 09/07/2023. Objective:  Vital signs in last 24 hours:  Blood pressure (!) 133/96, pulse 88, temperature 98.1 F (36.7 C), temperature source Temporal, resp. rate 18, height 5\' 7"  (1.702 m), weight 175 lb 11.2 oz (79.7 kg), last menstrual period 03/13/2012, SpO2 99%.    Resp: Lungs clear bilaterally Cardio: Regular rate and rhythm GI: No hepatosplenomegaly, approximately 2 cm nodular fullness at the right right upper abdomen abdominal wall, tenderness at the right lower abdomen, left lower quadrant colostomy Vascular: No leg edema   Lab Results:  Lab Results  Component Value Date   WBC 5.6 09/07/2023   HGB 14.4 09/07/2023   HCT 42.6 09/07/2023   MCV 101.7 (H) 09/07/2023   PLT 134 (L) 09/07/2023   NEUTROABS 10.9 (H) 12/03/2021    CMP  Lab Results  Component Value Date   NA 138 08/31/2023   K 4.1 08/31/2023   CL 103 08/31/2023   CO2 26 08/31/2023   GLUCOSE 155 (H) 08/31/2023   BUN 11 08/31/2023   CREATININE 0.57 08/31/2023   CALCIUM 9.7 08/31/2023   PROT 7.8 08/31/2023   ALBUMIN 4.6 08/31/2023   AST 110 (H) 08/31/2023   ALT 188 (H) 08/31/2023   ALKPHOS 84 08/31/2023   BILITOT 0.6 08/31/2023   GFRNONAA >60 08/31/2023   GFRAA 106 11/29/2020    Medications: I have reviewed the patient's current medications.   Assessment/Plan: Squamous cell carcinoma of the anal canal Colonoscopy 01/28/2021-rectal mass extending to the "outside", palpable on exam-biopsy  revealed high-grade squamous intraepithelial lesion (AIN 3/carcinoma in situ) Exam under anesthesia with transanal excision of an anal canal mass 02/25/2021-frozen section consistent with squamous cell carcinoma, 4 x 4 centimeter sessile anal canal mass extending to the anal verge, right lateral and posterior, 30% circumferential, fixed to the anal sphincter complex; anal rectal mass with mixed high-grade neuroendocrine squamous cell carcinoma; posterior anal tag with squamous cell carcinoma; left labial lesion-condyloma acuminata CTs 03/07/2021-ill-definition of tissue planes along the anus with abnormal soft tissue prominence posteriorly along the anal canal suspicious for mass.  Small adjacent lower perirectal lymph nodes in addition to a 0.5 cm sacral node.  Focal enhancing lesion in the right hepatic lobe 1.2 x 0.8 x 0.9 cm.  2.1 x 1.5 cm cystic lesion right ovary with thin enhancing rim. PET scan 03/16/2021-hypermetabolic mass within the anal canal.  No definite evidence of local or distant metastatic disease.  7 mm left submandibular lymph node with mild FDG uptake, likely reactive.  Focal site of FDG uptake within the inner quadrant of the right breast. Radiation 03/21/2021-04/27/2021 Cycle one 5-FU/mitomycin-C 03/21/2021 Cycle two 5-FU/Mitomycin-C 04/18/2021, 5-FU dose reduced secondary to mucositis Anoscopy by Beth [name] 07/20/2021-divet in the right posterior lateral aspect with some right anterior and right lateral folds consistent with radiation treatment.  No ulceration.  Not fully resolved but markedly shrunk down. Anorectal mass 09/30/2021 examination under anesthesia, biopsy-poorly differentiated carcinoma-basaloid squamous carcinoma and high-grade neuroendocrine carcinoma, margins positive APR/and vertical rectus abdominis flap  11/03/2021, invasive poorly differentiated carcinoma, negative resection margins, 0/10 nodes, treatment response score 2,ypT1,ypN0 CT abdomen/pelvis 12/03/2021-high-grade small  bowel obstruction transitioning in the left lower abdomen.  Presacral rim-enhancing 4.4 x 2.8 x 3.8 cm fluid collection.  No air in the collection.  Prior study described a potential abnormality in the right lobe of the liver, not seen on current study.  Cystic lesion of the right ovary resolved. CT chest 12/07/2021-interval development of bilateral trace pleural effusions. CTs 12/04/2022-new 2.6 x 2.5 cm right lower lobe nodule PET 12/21/2022-FDG avid right lower lobe nodule, no other evidence of metastatic disease 12/29/2022 right lower lobectomy, lymph node dissection-poorly differentiated carcinoma consistent with metastasis, 4.8 x 2.9 x 2.6 cm, margins free, 7 lymph nodes negative for carcinoma; cytohistomorphology correlation supports the current tumor being a metastasis from the previously diagnosed anal carcinoma CT chest 04/17/2023-interval right lower lobectomy with resection of right lung mass, pleural thickening and small right pleural effusion, no new mass or lymph node enlargement CTs 07/20/2023-diminished postoperative right pleural effusion.  Unchanged postoperative findings status post abdominoperineal resection with left lower quadrant and colostomy.  No evidence of metastatic disease. CT abdomen/pelvis 08/31/2023-heterogenous soft tissue nodule in the right upper quadrant anterior abdominal wall, no evidence of metastatic disease CT/ultrasound guided biopsy of right abdominal wall mass 09/07/2023-metastatic neoplasm consistent with a metastasis from anal cancer   Remote history of an anal fissure repair Tobacco use Moderate alcohol use Eczema Right breast mass- PET scan 03/16/2021 with focal FDG activity in the inner right breast Mammogram and right breast ultrasound 03/28/2021- negative Bilateral breast MRI 03/31/2021- 7 mm mass in the upper inner right breast correlating with the PET findings, no other abnormality, no adenopathy MRI guided biopsy of right breast mass 04/11/2021- fibrocystic  change with sclerosing adenosis, small focus of inflammation/fibrosis suggestive of resolving fat necrosis, no evidence of malignancy--repeat bilateral breast MRI at a 10-month interval; bilateral breast MRI on 10/13/2021-no evidence of breast malignancy.  Annual screening mammography recommended.   7.  Admission with small bowel obstruction 12/03/2021 status post exploratory laparotomy and lysis of adhesions 8.  Right abdominal pain/tenderness-potentially related to the right abdominal wall mass noted on CT 08/31/2023 9.  Elevated liver enzymes-chronic, etiology unclear 10.  Hematuria-evaluated by urology 09/11/2023, cystoscopy with radiation and infection changes, no tumor seen      Disposition: Beth [name] has a history of anal cancer.  She now has clinical and biopsy evidence of metastatic disease involving a right abdominal wall mass.  It is unclear whether the current right abdominal pain is related to the visible mass.  The biopsy from the pathology is consistent with progression of the anal cancer, though the histology remains atypical.  Repeat biopsies have revealed squamous and neuroendocrine components the current biopsy reveals similar histology though the neoplasm could not be definitively characterized based on immunohistochemical stains.  I discussed treatment options with Beth [name].  We discussed surgical resection of the abdominal wall mass.  This is unlikely to be curative, but could be considered if there is no evidence of additional metastatic disease.  We discussed obtaining a standard staging PET scan or dotatate PET.  We discussed standard systemic chemotherapy with paclitaxel/carboplatin.  We also discussed immunotherapy.  We will check PD-1 testing on the recent biopsy.  I will refer Beth [name] to Dr. Lattie [name] to get his opinion regarding treatment options.  She has a vacation planned from 09/25/2023 - 09/28/2023.  She will return for an office visit when she returns from  vacation.  She will call in the interim as needed.  Thornton Papas, MD  09/13/2023  4:15 PM

## 2023-09-14 ENCOUNTER — Encounter: Payer: Self-pay | Admitting: *Deleted

## 2023-09-14 NOTE — Progress Notes (Signed)
PATIENT NAVIGATOR PROGRESS NOTE  Name: Beth [name] Date: 09/14/2023 MRN: [id number]  DOB: February 09, 1971   Reason for visit:  Referral to Dr Lattie Corns at [facility]  Comments:  Referral for urgent appointment faxed to [facility] at [phone]     Time spent counseling/coordinating care: 30-45 minutes

## 2023-09-18 ENCOUNTER — Telehealth: Payer: Self-pay | Admitting: *Deleted

## 2023-09-18 NOTE — Telephone Encounter (Signed)
LVM that she sees Dr. Lattie Corns at [facility] on 10/08/23--does she still need to see Dr. Truett Perna on 10/16? Also reports [facility] wants to do "total body scan" on her and she is asking if it can be done here before she sees him? Will be out of town week of 10/7 and will need refill on pain medication before she leaves. This RN called [facility] and spoke w/triage. They will confirm w/Dr. Lattie Corns if he wants a scan done locally or there and what type of scan he is requesting.

## 2023-09-18 NOTE — Telephone Encounter (Signed)
LVM for patient that I am awaiting return call from Dr. Lattie Corns WG:NFAO. Will find out if we need to delay her f/u to after she sees Dr. Lattie Corns on 10/21. When does she need MD to refill her Oxycodone?

## 2023-09-19 ENCOUNTER — Other Ambulatory Visit: Payer: Self-pay | Admitting: Nurse Practitioner

## 2023-09-19 DIAGNOSIS — C210 Malignant neoplasm of anus, unspecified: Secondary | ICD-10-CM

## 2023-09-19 MED ORDER — OXYCODONE HCL 5 MG PO TABS: 2.5000 mg | ORAL_TABLET | ORAL | 0 refills | Status: DC | PRN

## 2023-09-19 NOTE — Telephone Encounter (Signed)
Tayna called back to report that Dr. Lattie Corns does not need a CT before seeing her.

## 2023-09-20 ENCOUNTER — Encounter (HOSPITAL_COMMUNITY): Payer: Self-pay

## 2023-09-26 ENCOUNTER — Encounter (HOSPITAL_COMMUNITY): Payer: Self-pay

## 2023-10-01 NOTE — Telephone Encounter (Signed)
error 

## 2023-10-03 ENCOUNTER — Ambulatory Visit: Payer: BC Managed Care – PPO | Attending: Oncology | Admitting: Oncology

## 2023-10-03 LAB — SURGICAL PATHOLOGY

## 2023-10-05 ENCOUNTER — Encounter: Payer: Self-pay | Admitting: *Deleted

## 2023-10-05 NOTE — Progress Notes (Signed)
Foundation one results faxed to [facility] for Monday 10/21 consult

## 2023-10-10 ENCOUNTER — Other Ambulatory Visit: Payer: Self-pay | Admitting: *Deleted

## 2023-10-10 ENCOUNTER — Encounter: Payer: Self-pay | Admitting: Oncology

## 2023-10-10 ENCOUNTER — Encounter: Payer: Self-pay | Admitting: *Deleted

## 2023-10-10 ENCOUNTER — Inpatient Hospital Stay: Payer: BC Managed Care – PPO

## 2023-10-10 ENCOUNTER — Inpatient Hospital Stay: Payer: BC Managed Care – PPO | Attending: Oncology | Admitting: Oncology

## 2023-10-10 VITALS — BP 128/87 | HR 96 | Temp 98.2°F | Resp 18 | Ht 67.0 in | Wt 176.9 lb

## 2023-10-10 DIAGNOSIS — C210 Malignant neoplasm of anus, unspecified: Secondary | ICD-10-CM | POA: Insufficient documentation

## 2023-10-10 DIAGNOSIS — Z720 Tobacco use: Secondary | ICD-10-CM | POA: Insufficient documentation

## 2023-10-10 DIAGNOSIS — F1090 Alcohol use, unspecified, uncomplicated: Secondary | ICD-10-CM | POA: Diagnosis not present

## 2023-10-10 DIAGNOSIS — R7401 Elevation of levels of liver transaminase levels: Secondary | ICD-10-CM | POA: Diagnosis not present

## 2023-10-10 DIAGNOSIS — N63 Unspecified lump in unspecified breast: Secondary | ICD-10-CM | POA: Insufficient documentation

## 2023-10-10 DIAGNOSIS — Z8616 Personal history of COVID-19: Secondary | ICD-10-CM

## 2023-10-10 DIAGNOSIS — C792 Secondary malignant neoplasm of skin: Secondary | ICD-10-CM | POA: Diagnosis not present

## 2023-10-10 DIAGNOSIS — L309 Dermatitis, unspecified: Secondary | ICD-10-CM | POA: Diagnosis not present

## 2023-10-10 LAB — CMP (CANCER CENTER ONLY)
ALT: 187 U/L — ABNORMAL HIGH (ref 0–44)
AST: 105 U/L — ABNORMAL HIGH (ref 15–41)
Albumin: 4.9 g/dL (ref 3.5–5.0)
Alkaline Phosphatase: 91 U/L (ref 38–126)
Anion gap: 6 (ref 5–15)
BUN: 10 mg/dL (ref 6–20)
CO2: 32 mmol/L (ref 22–32)
Calcium: 10.3 mg/dL (ref 8.9–10.3)
Chloride: 99 mmol/L (ref 98–111)
Creatinine: 0.69 mg/dL (ref 0.44–1.00)
GFR, Estimated: >60 mL/min (ref >60)
Glucose, Bld: 253 mg/dL — ABNORMAL HIGH (ref 70–99)
Potassium: 4.3 mmol/L (ref 3.5–5.1)
Sodium: 137 mmol/L (ref 135–145)
Total Bilirubin: 0.7 mg/dL (ref 0.3–1.2)
Total Protein: 8 g/dL (ref 6.5–8.1)

## 2023-10-10 LAB — CBC WITH DIFFERENTIAL (CANCER CENTER ONLY)
Abs Immature Granulocytes: 0.03 10*3/uL (ref 0.00–0.07)
Basophils Absolute: 0 10*3/uL (ref 0.0–0.1)
Basophils Relative: 0 %
Eosinophils Absolute: 0.2 10*3/uL (ref 0.0–0.5)
Eosinophils Relative: 3 %
HCT: 43.4 % (ref 36.0–46.0)
Hemoglobin: 14.9 g/dL (ref 12.0–15.0)
Immature Granulocytes: 0 %
Lymphocytes Relative: 12 %
Lymphs Abs: 0.8 10*3/uL (ref 0.7–4.0)
MCH: 34.3 pg — ABNORMAL HIGH (ref 26.0–34.0)
MCHC: 34.3 g/dL (ref 30.0–36.0)
MCV: 100 fL (ref 80.0–100.0)
Monocytes Absolute: 0.5 10*3/uL (ref 0.1–1.0)
Monocytes Relative: 7 %
Neutro Abs: 5.1 10*3/uL (ref 1.7–7.7)
Neutrophils Relative %: 78 %
Platelet Count: 152 10*3/uL (ref 150–400)
RBC: 4.34 MIL/uL (ref 3.87–5.11)
RDW: 12.5 % (ref 11.5–15.5)
WBC Count: 6.7 10*3/uL (ref 4.0–10.5)
nRBC: 0 % (ref 0.0–0.2)

## 2023-10-10 LAB — LACTATE DEHYDROGENASE: LDH: 242 U/L — ABNORMAL HIGH (ref 98–192)

## 2023-10-10 LAB — CEA (ACCESS): CEA (CHCC): 1.39 ng/mL (ref 0.00–5.00)

## 2023-10-10 MED ORDER — LIDOCAINE-PRILOCAINE 2.5-2.5 % EX CREA: 1.0000 | TOPICAL_CREAM | CUTANEOUS | 0 refills | Status: DC | PRN

## 2023-10-10 MED ORDER — ONDANSETRON HCL 8 MG PO TABS: 8.0000 mg | ORAL_TABLET | Freq: Three times a day (TID) | ORAL | 2 refills | Status: DC | PRN

## 2023-10-10 MED ORDER — PROCHLORPERAZINE MALEATE 10 MG PO TABS: 10.0000 mg | ORAL_TABLET | Freq: Four times a day (QID) | ORAL | 2 refills | Status: DC | PRN

## 2023-10-10 MED ORDER — DEXAMETHASONE 2 MG PO TABS: 10.0000 mg | ORAL_TABLET | ORAL | 0 refills | Status: DC

## 2023-10-10 NOTE — Progress Notes (Signed)
DISCONTINUE ON PATHWAY REGIMEN - Anal Carcinoma ? ? ?  One cycle, concurrent with RT: ?    Fluorouracil  ?    Mitomycin  ? ?**Always confirm dose/schedule in your pharmacy ordering system** ? ?REASON: Disease Progression ?PRIOR TREATMENT: ANLOS01: Fluorouracil 1,000 mg/m2/day CIV D1-4, 29-32 + Mitomycin 10 mg/m2 D1, 29 + Concurrent Radiation Therapy ?TREATMENT RESPONSE: Partial Response (PR) ? ?START ON PATHWAY REGIMEN - Anal Carcinoma ? ? ?  A cycle is every 28 days: ?    Paclitaxel  ?    Carboplatin  ? ?**Always confirm dose/schedule in your pharmacy ordering system** ? ?Patient Characteristics: ?Anal Canal Tumors, Distant Metastases / Local Recurrence - Unresectable, First Line ?Therapeutic Status: Distant Metastases ?Line of therapy: First Line ? ?Intent of Therapy: ?Non-Curative / Palliative Intent, Discussed with Patient ?

## 2023-10-10 NOTE — Progress Notes (Signed)
PATIENT NAVIGATOR PROGRESS NOTE  Name: Beth [name] Date: 10/10/2023 MRN: [id number]  DOB: [birth year]   Reason for visit:  F/U visit  Comments:  Met with [name] and her mother during appt with Dr Frances Nickels placement will be at [facility] hospital on 10/31 and written instructions given Anti nausea and EMLA cream called into pharmacy Given hand outs on Taxol/Carboplatin/Retifanlimab  4.   Referral to Genetics 5.  Prescriptions for anti nausea Compazine and Zofran and then Dexamethasone night before and morning of first dose 6.  Discussed [facility] for wig selection   Time spent counseling/coordinating care: > 60 minutes

## 2023-10-10 NOTE — Progress Notes (Addendum)
[facility] OFFICE PROGRESS NOTE   Diagnosis: Anal cancer  INTERVAL HISTORY:   Beth [name] returns as scheduled.  She reports discomfort superior to the right eye since having COVID in July.  The pain is not severe.  No associated symptoms.  Her chief complaint is constant discomfort in the right abdomen.  No difficulty with bowel function.  She saw Dr. Lattie Corns on 10/08/2023.  He recommends proceeding with Taxol/carboplatin and a PD-1 inhibitor.  Objective:  Vital signs in last 24 hours:  Blood pressure 128/87, pulse 96, temperature 98.2 F (36.8 C), temperature source Oral, resp. rate 18, height 5\' 7"  (1.702 m), weight 176 lb 14.4 oz (80.2 kg), last menstrual period 03/13/2012, SpO2 99%.     Lymphatics: No cervical, supraclavicular, axillary, or inguinal nodes Resp: Lungs clear bilaterally Cardio: Rate and rhythm GI: Firm masslike fullness at the right subcostal region, no hepatosplenomegaly, left lower quadrant colostomy Vascular: No leg edema Musculoskeletal: No mass or tenderness at the right supraorbital region  Lab Results:  Lab Results  Component Value Date   WBC 6.7 10/10/2023   HGB 14.9 10/10/2023   HCT 43.4 10/10/2023   MCV 100.0 10/10/2023   PLT 152 10/10/2023   NEUTROABS 5.1 10/10/2023    CMP  Lab Results  Component Value Date   NA 137 10/10/2023   K 4.3 10/10/2023   CL 99 10/10/2023   CO2 32 10/10/2023   GLUCOSE 253 (H) 10/10/2023   BUN 10 10/10/2023   CREATININE 0.69 10/10/2023   CALCIUM 10.3 10/10/2023   PROT 8.0 10/10/2023   ALBUMIN 4.9 10/10/2023   AST 105 (H) 10/10/2023   ALT 187 (H) 10/10/2023   ALKPHOS 91 10/10/2023   BILITOT 0.7 10/10/2023   GFRNONAA >60 10/10/2023   GFRAA 106 11/29/2020    Lab Results  Component Value Date   CEA 1.39 10/10/2023     Medications: I have reviewed the patient's current medications.   Assessment/Plan: Squamous cell carcinoma of the anal canal Colonoscopy 01/28/2021-rectal mass  extending to the "outside", palpable on exam-biopsy revealed high-grade squamous intraepithelial lesion (AIN 3/carcinoma in situ) Exam under anesthesia with transanal excision of an anal canal mass 02/25/2021-frozen section consistent with squamous cell carcinoma, 4 x 4 centimeter sessile anal canal mass extending to the anal verge, right lateral and posterior, 30% circumferential, fixed to the anal sphincter complex; anal rectal mass with mixed high-grade neuroendocrine squamous cell carcinoma; posterior anal tag with squamous cell carcinoma; left labial lesion-condyloma acuminata CTs 03/07/2021-ill-definition of tissue planes along the anus with abnormal soft tissue prominence posteriorly along the anal canal suspicious for mass.  Small adjacent lower perirectal lymph nodes in addition to a 0.5 cm sacral node.  Focal enhancing lesion in the right hepatic lobe 1.2 x 0.8 x 0.9 cm.  2.1 x 1.5 cm cystic lesion right ovary with thin enhancing rim. PET scan 03/16/2021-hypermetabolic mass within the anal canal.  No definite evidence of local or distant metastatic disease.  7 mm left submandibular lymph node with mild FDG uptake, likely reactive.  Focal site of FDG uptake within the inner quadrant of the right breast. Radiation 03/21/2021-04/27/2021 Cycle one 5-FU/mitomycin-C 03/21/2021 Cycle two 5-FU/Mitomycin-C 04/18/2021, 5-FU dose reduced secondary to mucositis Anoscopy by Dr. Michaell Cowing 07/20/2021-divet in the right posterior lateral aspect with some right anterior and right lateral folds consistent with radiation treatment.  No ulceration.  Not fully resolved but markedly shrunk down. Anorectal mass 09/30/2021 examination under anesthesia, biopsy-poorly differentiated carcinoma-basaloid squamous carcinoma and high-grade neuroendocrine  carcinoma, margins positive APR/and vertical rectus abdominis flap 11/03/2021, invasive poorly differentiated carcinoma, negative resection margins, 0/10 nodes, treatment response score  2,ypT1,ypN0 CT abdomen/pelvis 12/03/2021-high-grade small bowel obstruction transitioning in the left lower abdomen.  Presacral rim-enhancing 4.4 x 2.8 x 3.8 cm fluid collection.  No air in the collection.  Prior study described a potential abnormality in the right lobe of the liver, not seen on current study.  Cystic lesion of the right ovary resolved. CT chest 12/07/2021-interval development of bilateral trace pleural effusions. CTs 12/04/2022-new 2.6 x 2.5 cm right lower lobe nodule PET 12/21/2022-FDG avid right lower lobe nodule, no other evidence of metastatic disease 12/29/2022 right lower lobectomy, lymph node dissection-poorly differentiated carcinoma consistent with metastasis, 4.8 x 2.9 x 2.6 cm, margins free, 7 lymph nodes negative for carcinoma; cytohistomorphology correlation supports the current tumor being a metastasis from the previously diagnosed anal carcinoma CT chest 04/17/2023-interval right lower lobectomy with resection of right lung mass, pleural thickening and small right pleural effusion, no new mass or lymph node enlargement CTs 07/20/2023-diminished postoperative right pleural effusion.  Unchanged postoperative findings status post abdominoperineal resection with left lower quadrant and colostomy.  No evidence of metastatic disease. CT abdomen/pelvis 08/31/2023-heterogenous soft tissue nodule in the right upper quadrant anterior abdominal wall, no evidence of metastatic disease CT/ultrasound guided biopsy of right abdominal wall mass 09/07/2023-metastatic neoplasm consistent with a metastasis from anal cancer, Foundation 1-HRD signature negative, MSS, tumor mutation burden 4, BRCA 1 alteration, PD-L1 TPS 10%   Remote history of an anal fissure repair Tobacco use Moderate alcohol use Eczema Right breast mass- PET scan 03/16/2021 with focal FDG activity in the inner right breast Mammogram and right breast ultrasound 03/28/2021- negative Bilateral breast MRI 03/31/2021- 7 mm mass in  the upper inner right breast correlating with the PET findings, no other abnormality, no adenopathy MRI guided biopsy of right breast mass 04/11/2021- fibrocystic change with sclerosing adenosis, small focus of inflammation/fibrosis suggestive of resolving fat necrosis, no evidence of malignancy--repeat bilateral breast MRI at a 39-month interval; bilateral breast MRI on 10/13/2021-no evidence of breast malignancy.  Annual screening mammography recommended.   7.  Admission with small bowel obstruction 12/03/2021 status post exploratory laparotomy and lysis of adhesions 8.  Right abdominal pain/tenderness-potentially related to the right abdominal wall mass noted on CT 08/31/2023 9.  Elevated liver enzymes-chronic, etiology unclear 10.  Hematuria-evaluated by urology 09/11/2023, cystoscopy with radiation and infection changes, no tumor seen       Disposition: Beth [name] has been diagnosed with metastatic anal carcinoma.  There is a metastatic lesion in the right abdominal wall.  She is symptomatic with pain.  She saw Dr. Lattie Corns on 10/08/2023.  He recommends proceeding with systemic therapy.  He recommends paclitaxel/carboplatin and immunotherapy.  This is based on data from an ESMO study combining paclitaxel/carboplatin and retifanlimab. Reviewed potential toxicities associated with paclitaxel/carboplatin chemotherapy regimen including the chance of nausea/vomiting, alopecia, hematologic toxicity, infection, and bleeding.  We discussed the allergic reaction associated with paclitaxel.  We discussed the neuropathy, ototoxicity, and bone pain associated with paclitaxel.  We reviewed the allergic reaction and various autoimmune toxicities associated with PD-1 inhibitors.  She agrees to proceed.  Beth [name] will be referred for Port-A-Cath placement 10/18/2023.  The plan is to begin systemic therapy on 10/19/2023.  She will receive day 1 carboplatin/paclitaxel/retifanlimab and day 8/day 15 paclitaxel on a  28-day schedule.  A chemotherapy plan was entered today.  We will plan to administer immunotherapy if her insurance company  will not improve the PD-1 inhibitor.  She has chronic mild elevation of liver enzymes of unclear etiology.  We will monitor the liver enzymes during the course of systemic therapy.  There is a BRCA1 alteration involving the abdominal wall tumor biopsy.  She most likely does not have a germline BRCA mutation, but we will refer her for genetic testing. Thornton Papas, MD  10/10/2023  4:11 PM

## 2023-10-11 ENCOUNTER — Other Ambulatory Visit: Payer: Self-pay

## 2023-10-13 ENCOUNTER — Other Ambulatory Visit: Payer: Self-pay

## 2023-10-14 ENCOUNTER — Other Ambulatory Visit: Payer: Self-pay | Admitting: Oncology

## 2023-10-15 ENCOUNTER — Encounter: Payer: Self-pay | Admitting: Oncology

## 2023-10-16 ENCOUNTER — Encounter: Payer: Self-pay | Admitting: Oncology

## 2023-10-17 ENCOUNTER — Other Ambulatory Visit: Payer: Self-pay

## 2023-10-18 ENCOUNTER — Ambulatory Visit (HOSPITAL_COMMUNITY)
Admission: RE | Admit: 2023-10-18 | Discharge: 2023-10-18 | Disposition: A | Discharge status: Home / Self Care | Payer: BC Managed Care – PPO | Source: Ambulatory Visit | Attending: Oncology | Admitting: Oncology

## 2023-10-18 ENCOUNTER — Encounter (HOSPITAL_COMMUNITY): Payer: Self-pay

## 2023-10-18 ENCOUNTER — Encounter: Payer: Self-pay | Admitting: *Deleted

## 2023-10-18 ENCOUNTER — Other Ambulatory Visit: Payer: Self-pay

## 2023-10-18 DIAGNOSIS — C7801 Secondary malignant neoplasm of right lung: Secondary | ICD-10-CM | POA: Diagnosis not present

## 2023-10-18 DIAGNOSIS — C210 Malignant neoplasm of anus, unspecified: Secondary | ICD-10-CM | POA: Insufficient documentation

## 2023-10-18 DIAGNOSIS — Z923 Personal history of irradiation: Secondary | ICD-10-CM | POA: Insufficient documentation

## 2023-10-18 DIAGNOSIS — E119 Type 2 diabetes mellitus without complications: Secondary | ICD-10-CM | POA: Diagnosis not present

## 2023-10-18 DIAGNOSIS — Z933 Colostomy status: Secondary | ICD-10-CM | POA: Diagnosis not present

## 2023-10-18 DIAGNOSIS — C7989 Secondary malignant neoplasm of other specified sites: Secondary | ICD-10-CM | POA: Diagnosis not present

## 2023-10-18 DIAGNOSIS — Z7984 Long term (current) use of oral hypoglycemic drugs: Secondary | ICD-10-CM | POA: Insufficient documentation

## 2023-10-18 DIAGNOSIS — Z87891 Personal history of nicotine dependence: Secondary | ICD-10-CM | POA: Insufficient documentation

## 2023-10-18 DIAGNOSIS — Z9221 Personal history of antineoplastic chemotherapy: Secondary | ICD-10-CM | POA: Diagnosis not present

## 2023-10-18 HISTORY — PX: IR IMAGING GUIDED PORT INSERTION: IMG5740

## 2023-10-18 LAB — GLUCOSE, CAPILLARY: Glucose-Capillary: 142 mg/dL — ABNORMAL HIGH (ref 70–99)

## 2023-10-18 MED ORDER — LIDOCAINE-EPINEPHRINE 1 %-1:100000 IJ SOLN
INTRAMUSCULAR | Status: AC
  Filled 2023-10-18: qty 1

## 2023-10-18 MED ORDER — MIDAZOLAM HCL 2 MG/2ML IJ SOLN
INTRAMUSCULAR | Status: AC | PRN
  Administered 2023-10-18: 1 mg via INTRAVENOUS

## 2023-10-18 MED ORDER — FENTANYL CITRATE (PF) 100 MCG/2ML IJ SOLN
INTRAMUSCULAR | Status: AC | PRN
  Administered 2023-10-18: 50 ug via INTRAVENOUS

## 2023-10-18 MED ORDER — HEPARIN SOD (PORK) LOCK FLUSH 100 UNIT/ML IV SOLN
500.0000 [IU] | Freq: Once | INTRAVENOUS | Status: AC
  Administered 2023-10-18: 500 [IU] via INTRAVENOUS

## 2023-10-18 MED ORDER — HEPARIN SOD (PORK) LOCK FLUSH 100 UNIT/ML IV SOLN
INTRAVENOUS | Status: AC
  Filled 2023-10-18: qty 5

## 2023-10-18 MED ORDER — MIDAZOLAM HCL 2 MG/2ML IJ SOLN
INTRAMUSCULAR | Status: AC
Start: 2023-10-18 — End: ?
  Filled 2023-10-18: qty 2

## 2023-10-18 MED ORDER — HEPARIN SOD (PORK) LOCK FLUSH 100 UNIT/ML IV SOLN
INTRAVENOUS | Status: AC
Start: 2023-10-18 — End: ?
  Filled 2023-10-18: qty 5

## 2023-10-18 MED ORDER — SODIUM CHLORIDE 0.9 % IV SOLN: INTRAVENOUS | Status: DC

## 2023-10-18 MED ORDER — LIDOCAINE-EPINEPHRINE 1 %-1:100000 IJ SOLN
20.0000 mL | Freq: Once | INTRAMUSCULAR | Status: AC
  Administered 2023-10-18: 12 mL via INTRADERMAL

## 2023-10-18 MED ORDER — FENTANYL CITRATE (PF) 100 MCG/2ML IJ SOLN
INTRAMUSCULAR | Status: AC
  Filled 2023-10-18: qty 2

## 2023-10-18 NOTE — H&P (Signed)
Chief Complaint: Patient was seen in consultation today for port-a-catheter placement.   Referring Physician(s): Ladene Artist  Supervising Physician: Simonne Come  Patient Status: [facility] - Out-pt  History of Present Illness: Beth [name] is a 52 y.o. female with a medical history significant for DM2 and squamous cell carcinoma of the anal canal initially diagnosed in 2022. She is s/p surgery, radiation and chemotherapy via PICC placed in IR. She also has a history of small bowel obstruction in 2022 requiring surgery and colostomy creation.   She developed metastatic disease in the right lower lobe and underwent right lower lobectomy with lymph node dissection January 2024. Unfortunately a surveillance scan in September showed an abdominal wall mass and the patient was seen in IR for a biopsy 09/07/23. Pathology was positive for metastatic disease. Her oncology team is preparing her for palliative chemotherapy and she will require durable venous access.      Past Medical History:  Diagnosis Date   Acne    on face spirolactone for   Anal cancer (HCC) 02/25/2021   Constipation, chronic 08/08/2016   Diabetes mellitus without complication (HCC) 08/2022   Type II   Family history of adverse reaction to anesthesia    Mother has trouble waking takes longer   Herpes simplex    History of migraine    none in years   Wears glasses     Past Surgical History:  Procedure Laterality Date   ABDOMINAL HYSTERECTOMY  2016   partial   COLONOSCOPY     COLONOSCOPY WITH PROPOFOL N/A 01/28/2021   Procedure: COLONOSCOPY WITH PROPOFOL;  Surgeon: Pasty Spillers, MD;  Location: [facility] ENDOSCOPY;  Service: Endoscopy;  Laterality: N/A;   EXCISION HYDRADENITIS LABIA Left 02/25/2021   Procedure: EXCISION of  LABIAL LESIOM;  Surgeon: Karie Soda, MD;  Location: [facility] [place];  Service: General;  Laterality: Left;   INTERCOSTAL NERVE BLOCK  12/29/2022   Procedure: INTERCOSTAL NERVE  BLOCK;  Surgeon: Loreli Slot, MD;  Location[facility] OR;  Service: Thoracic;;   IR FLUORO GUIDE CV LINE RIGHT  04/18/2021   LAPAROTOMY N/A 12/03/2021   Procedure: EXPLORATORY LAPAROTOMY, LYSIS OF ADHESIONS;  Surgeon: Axel Filler, MD;  Location: [facility] OR;  Service: General;  Laterality: N/A;   LYMPH NODE DISSECTION Right 12/29/2022   Procedure: LYMPH NODE DISSECTION;  Surgeon: Loreli Slot, MD;  Location: [facility] OR;  Service: Thoracic;  Laterality: Right;   OSTOMY N/A 11/03/2021   Procedure: END COLOSTOMY;  Surgeon: Karie Soda, MD;  Location: [facility] ORS;  Service: General;  Laterality: N/A;   PECTORALIS FLAP N/A 11/03/2021   Procedure: VERTICAL RECTUS ABDOMINIS MYOCUTANEOUS TO PERINEUM;  Surgeon: Glenna Fellows, MD;  Location: [facility] ORS;  Service: Plastics;  Laterality: N/A;   RECTAL BIOPSY N/A 09/30/2021   Procedure: ANORECTAL MASS BIOPSY;  Surgeon: Karie Soda, MD;  Location: [facility] ORS;  Service: General;  Laterality: N/A;   RECTAL EXAM UNDER ANESTHESIA N/A 02/25/2021   Procedure: ANORECTAL EXAM UNDER ANESTHESIA;  Surgeon: Karie Soda, MD;  Location: [facility] [place];  Service: General;  Laterality: N/A;   RECTAL EXAM UNDER ANESTHESIA N/A 09/30/2021   Procedure: ANORECTAL EXAMINATION UNDER ANESTHESIA;  Surgeon: Karie Soda, MD;  Location: [facility];  Service: General;  Laterality: N/A;  GEN & LOCAL ANESTHESIA   SPHINCTEROTOMY N/A 08/30/2016   Procedure: LATERAL INTERNAL AND SPHINCTEROTOMY;  Surgeon: Karie Soda, MD;  Location: [facility];  Service: General;  Laterality: N/A;   TONSILLECTOMY  as child  TRANSANAL EXCISION OF RECTAL MASS N/A 02/25/2021   Procedure: TRANSANAL EXCISIONAL  BIOPSY OF ANAL CANAL MASS;  Surgeon: Karie Soda, MD;  Location: [facility];  Service: General;  Laterality: N/A;   XI ROBOT ABDOMINAL PERINEAL RESECTION N/A 11/03/2021   Procedure: XI ROBOT ABDOMINOPERINEAL RESECTION;  Surgeon: Karie Soda, MD;  Location: [facility] OR[facility];  Service:  General;  Laterality: N/A;    Allergies: Clindamycin/lincomycin and Sulfa drugs cross reactors  Medications: Prior to Admission medications   Medication Sig Start Date End Date Taking? Authorizing Provider  acetaminophen (TYLENOL) 500 MG tablet Take 1,000 mg by mouth every 6 (six) hours as needed (for pain.).   Yes [provider]  cetirizine (ZYRTEC) 10 MG tablet Take 10 mg by mouth in the morning.   Yes [provider]  clobetasol ointment (TEMOVATE) 0.05 % Apply 1 Application topically at bedtime. Apply to palms at bedtime and cover with gloves   Yes [provider]  docusate sodium (COLACE) 100 MG capsule Take 100 mg by mouth daily as needed for mild constipation (in the afternoon for regularity).   Yes [provider]  gabapentin (NEURONTIN) 300 MG capsule Take 300 mg by mouth 3 (three) times daily. Takes w/600 mg capsule to equal 900 mg 04/06/23  Yes [provider]  gabapentin (NEURONTIN) 600 MG tablet Take 900 mg by mouth in the morning, at noon, and at bedtime. Morning, afternoon & night 09/09/22  Yes [provider]  glipiZIDE (GLUCOTROL XL) 2.5 MG 24 hr tablet Take 2.5 mg by mouth every morning. 09/08/22  Yes [provider]  hydrOXYzine (ATARAX) 25 MG tablet Take 25 mg by mouth at bedtime. 07/11/22  Yes [provider]  oxyCODONE (OXY IR/ROXICODONE) 5 MG immediate release tablet Take 0.5-1 tablets (2.5-5 mg total) by mouth every 4 (four) hours as needed for severe pain. 09/19/23  Yes Rana Snare, NP  SEMGLEE, YFGN, 100 UNIT/ML Pen Inject 8 Units into the skin every evening. 09/13/22  Yes [provider]  spironolactone (ALDACTONE) 100 MG tablet Take 100 mg by mouth daily in the afternoon.   Yes [provider]  ciprofloxacin (CIPRO) 250 MG tablet Take 250 mg by mouth 2 (two) times daily. 09/11/23   [provider]  dexamethasone (DECADRON) 2 MG tablet Take 5 tablets (10 mg total) by mouth  as directed. Take #5 tablets at bedtime night before 1st Taxol and repeat at 6 am day of 1st Taxol to prevent hypersensitivity reaction. 10/10/23   Ladene Artist, MD  ibuprofen (ADVIL) 200 MG tablet Take 400 mg by mouth every 8 (eight) hours as needed (for pain.). Patient not taking: Reported on 10/10/2023    [provider]  lidocaine-prilocaine (EMLA) cream Apply 1 Application topically as needed (Apply to port area 1-2 hours, start using 2 weeks after placement). 10/10/23   Ladene Artist, MD  ondansetron (ZOFRAN) 8 MG tablet Take 1 tablet (8 mg total) by mouth every 8 (eight) hours as needed (Take after 3 days after chemo as needed for nausea). 10/10/23   Ladene Artist, MD  polyethylene glycol (MIRALAX / GLYCOLAX) 17 g packet Take 17 g by mouth in the morning.    [provider]  prochlorperazine (COMPAZINE) 10 MG tablet Take 1 tablet (10 mg total) by mouth every 6 (six) hours as needed for nausea or vomiting. 10/10/23   Ladene Artist, MD  triamcinolone cream (KENALOG) 0.5 % Apply 1 Application topically 2 (two) times daily  as needed (skin irritation.). Patient not taking: Reported on 09/13/2023    [provider]  valACYclovir (VALTREX) 500 MG tablet Take 500 mg by mouth in the morning. 08/10/22   [provider]     Family History  Problem Relation Age of Onset   Breast cancer Paternal Aunt     Social History   Socioeconomic History   Marital status: Married    Spouse name: Debroah Loop   Number of children: 3   Years of education: Not on file   Highest education level: Not on file  Occupational History   Not on file  Tobacco Use   Smoking status: Former    Current packs/day: 0.00    Average packs/day: 1 pack/day for 8.0 years (8.0 ttl pk-yrs)    Types: Cigarettes    Start date: 02/15/2013    Quit date: 02/15/2021    Years since quitting: 2.6   Smokeless tobacco: Never  Vaping Use   Vaping status: Never Used  Substance and Sexual Activity    Alcohol use: Yes    Alcohol/week: 14.0 standard drinks of alcohol    Types: 14 Shots of liquor per week    Comment: 2 dirnks daily   Drug use: No   Sexual activity: Yes    Birth control/protection: Surgical    Comment: Hysterectomy  Other Topics Concern   Not on file  Social History Narrative   Not on file   Social Determinants of Health   Financial Resource Strain: Not on file  Food Insecurity: Not on file  Transportation Needs: Not on file  Physical Activity: Inactive (11/12/2017)   Exercise Vital Sign    Days of Exercise per Week: 0 days    Minutes of Exercise per Session: 0 min  Stress: No Stress Concern Present (11/12/2017)   [employer] of Occupational Health - Occupational Stress Questionnaire    Feeling of Stress : Only a little  Social Connections: Somewhat Isolated (11/12/2017)   Social Connection and Isolation Panel [NHANES]    Frequency of Communication with Friends and Family: More than three times a week    Frequency of Social Gatherings with Friends and Family: Once a week    Attends Religious Services: Never    Database administrator or Organizations: No    Attends Engineer, structural: Never    Marital Status: Married    Review of Systems: A 12 point ROS discussed and pertinent positives are indicated in the HPI above.  All other systems are negative.  Review of Systems  Constitutional:  Negative for appetite change and fatigue.  Respiratory:  Negative for cough and shortness of breath.   Cardiovascular:  Negative for chest pain and leg swelling.  Gastrointestinal:  Negative for abdominal pain, diarrhea, nausea and vomiting.  Genitourinary:  Negative for flank pain.  Musculoskeletal:  Negative for back pain.  Neurological:  Negative for dizziness and headaches.    Vital Signs: BP (!) (P) 133/93 Comment: left arm  Pulse 82   Temp 98 F (36.7 C) (Oral)   Resp 16   Ht 5\' 7"  (1.702 m)   Wt 175 lb (79.4 kg)   LMP 03/13/2012   SpO2  98%   BMI 27.41 kg/m   Physical Exam Constitutional:      General: She is not in acute distress.    Appearance: She is not ill-appearing.  HENT:     Mouth/Throat:     Mouth: Mucous membranes are moist.     Pharynx:  Oropharynx is clear.  Cardiovascular:     Rate and Rhythm: Normal rate.  Pulmonary:     Effort: Pulmonary effort is normal.  Abdominal:     Palpations: Abdomen is soft.     Tenderness: There is no abdominal tenderness.     Comments: colostomy  Skin:    General: Skin is warm and dry.  Neurological:     Mental Status: She is alert and oriented to person, place, and time.  Psychiatric:        Mood and Affect: Mood normal.        Behavior: Behavior normal.        Thought Content: Thought content normal.        Judgment: Judgment normal.     Imaging: No results found.  Labs:  CBC: Recent Labs    12/31/22 0020 08/31/23 1020 09/07/23 0745 10/10/23 1437  WBC 8.3 6.6 5.6 6.7  HGB 13.3 14.9 14.4 14.9  HCT 40.2 43.4 42.6 43.4  PLT 129* 146* 134* 152    COAGS: Recent Labs    12/27/22 0823 09/07/23 0745  INR 1.1 1.1  APTT 28  --     BMP: Recent Labs    12/31/22 0020 07/20/23 0741 08/31/23 1020 10/10/23 1437  NA 135 138 138 137  K 3.7 4.2 4.1 4.3  CL 105 103 103 99  CO2 21* 28 26 32  GLUCOSE 169* 147* 155* 253*  BUN 11 10 11 10   CALCIUM 8.4* 9.9 9.7 10.3  CREATININE 0.76 0.58 0.57 0.69  GFRNONAA >60 >60 >60 >60    LIVER FUNCTION TESTS: Recent Labs    12/27/22 0823 12/31/22 0020 08/31/23 1020 10/10/23 1437  BILITOT 0.8 1.0 0.6 0.7  AST 181* 52* 110* 105*  ALT 253* 84* 188* 187*  ALKPHOS 71 59 84 91  PROT 7.8 6.3* 7.8 8.0  ALBUMIN 4.4 3.5 4.6 4.9    TUMOR MARKERS: Recent Labs    10/10/23 1437  CEA 1.39    Assessment and Plan:  Metastatic anal cancer; pending chemotherapy: Beth [name], 52 year old female, presents today to the [facility] Interventional Radiology department for an image-guided port-a-catheter  placement.   Risks and benefits of image-guided Port-a-catheter placement were discussed with the patient including, but not limited to bleeding, infection, pneumothorax, or fibrin sheath development and need for additional procedures.  All of the patient's questions were answered, patient is agreeable to proceed. She has been NPO. She is a full code.   Consent signed and in chart.  Thank you for this interesting consult.  I greatly enjoyed meeting Beth [name] and look forward to participating in their care.  A copy of this report was sent to the requesting provider on this date.  Electronically Signed: Alwyn Ren, AGACNP-BC [phone] 10/18/2023, 1:45 PM   I spent a total of  30 Minutes   in face to face in clinical consultation, greater than 50% of which was counseling/coordinating care for port-a-catheter placement.

## 2023-10-18 NOTE — Discharge Instructions (Signed)
For questions /concerns may call Interventional Radiology at [phone] or  Interventional Radiology clinic [phone]   You may remove your dressing and shower tomorrow afternoon  DO NOT use EMLA cream for 2 weeks after port placement as the cream will remove surgical glue on your incision.                                                                                                                                                                         Implanted Port Insertion, Care After The following information offers guidance on how to care for yourself after your procedure. Your health care provider may also give you more specific instructions. If you have problems or questions, contact your health care provider. What can I expect after the procedure? After the procedure, it is common to have: Discomfort at the port insertion site. Bruising on the skin over the port. This should improve over 3-4 days. Follow these instructions at home: [facility] care After your port is placed, you will get a manufacturer's information card. The card has information about your port. Keep this card with you at all times. Take care of the port as told by your health care provider. Ask your health care provider if you or a family member can get training for taking care of the port at home. A home health care nurse will be be available to help care for the port. Make sure to remember what type of port you have. Incision care  Follow instructions from your health care provider about how to take care of your port insertion site. Make sure you: Wash your hands with soap and water for at least 20 seconds before and after you change your bandage (dressing). If soap and water are not available, use hand sanitizer. Change your dressing as told by your health care provider. Leave stitches (sutures), skin glue, or adhesive strips in place. These skin closures may need to stay in place for 2 weeks or  longer. If adhesive strip edges start to loosen and curl up, you may trim the loose edges. Do not remove adhesive strips completely unless your health care provider tells you to do that. Check your port insertion site every day for signs of infection. Check for: Redness, swelling, or pain. Fluid or blood. Warmth. Pus or a bad smell. Activity Return to your normal activities as told by your health care provider. Ask your health care provider what activities are safe for you. You may have to avoid lifting. Ask your health care provider how much you can safely lift. General instructions Take over-the-counter and prescription medicines only as told by your health care provider. Do not take baths, swim, or use a hot tub until your health care  provider approves. Ask your health care provider if you may take showers. You may only be allowed to take sponge baths. If you were given a sedative during the procedure, it can affect you for several hours. Do not drive or operate machinery until your health care provider says that it is safe. Wear a medical alert bracelet in case of an emergency. This will tell any health care providers that you have a port. Keep all follow-up visits. This is important. Contact a health care provider if: You cannot flush your port with saline as directed, or you cannot draw blood from the port. You have a fever or chills. You have redness, swelling, or pain around your port insertion site. You have fluid or blood coming from your port insertion site. Your port insertion site feels warm to the touch. You have pus or a bad smell coming from the port insertion site. Get help right away if: You have chest pain or shortness of breath. You have bleeding from your port that you cannot control. These symptoms may be an emergency. Get help right away. Call 911. Do not wait to see if the symptoms will go away. Do not drive yourself to the hospital. Summary Take care of the port  as told by your health care provider. Keep the manufacturer's information card with you at all times. Change your dressing as told by your health care provider. Contact a health care provider if you have a fever or chills or if you have redness, swelling, or pain around your port insertion site. Keep all follow-up visits. This information is not intended to replace advice given to you by your health care provider. Make sure you discuss any questions you have with your health care provider. Document Revised: 06/07/2021 Document Reviewed: 06/07/2021 Elsevier Patient Education  2024 Elsevier Inc.                                                                                             Moderate Conscious Sedation, Adult, Care After After the procedure, it is common to have: Sleepiness for a few hours. Impaired judgment for a few hours. Trouble with balance. Nausea or vomiting if you eat too soon. Follow these instructions at home: For the time period you were told by your health care provider:  Rest. Do not participate in activities where you could fall or become injured. Do not drive or use machinery. Do not drink alcohol. Do not take sleeping pills or medicines that cause drowsiness. Do not make important decisions or sign legal documents. Do not take care of children on your own. Eating and drinking Follow instructions from your health care provider about what you may eat and drink. Drink enough fluid to keep your urine pale yellow. If you vomit: Drink clear fluids slowly and in small amounts as you are able. Clear fluids include water, ice chips, low-calorie sports drinks, and fruit juice that has water added to it (diluted fruit juice). Eat light and bland foods in small amounts as you are able. These foods include bananas, applesauce, rice, lean meats, toast, and crackers. General instructions Take over-the-counter and  prescription medicines only as told by your health care  provider. Have a responsible adult stay with you for the time you are told. Do not use any products that contain nicotine or tobacco. These products include cigarettes, chewing tobacco, and vaping devices, such as e-cigarettes. If you need help quitting, ask your health care provider. Return to your normal activities as told by your health care provider. Ask your health care provider what activities are safe for you. Your health care provider may give you more instructions. Make sure you know what you can and cannot do. Contact a health care provider if: You are still sleepy or having trouble with balance after 24 hours. You feel light-headed. You vomit every time you eat or drink. You get a rash. You have a fever. You have redness or swelling around the IV site. Get help right away if: You have trouble breathing. You start to feel confused at home. These symptoms may be an emergency. Get help right away. Call 911. Do not wait to see if the symptoms will go away. Do not drive yourself to the hospital. This information is not intended to replace advice given to you by your health care provider. Make sure you discuss any questions you have with your health care provider. Document Revised: 06/19/2022 Document Reviewed: 06/19/2022 Elsevier Patient Education  2024 [employer].

## 2023-10-18 NOTE — Procedures (Signed)
Pre Procedure Dx: Poor venous access Post Procedural Dx: Same  Successful placement of right IJ approach port-a-cath with tip at the superior caval atrial junction. The catheter is ready for immediate use.  Estimated Blood Loss: Trace  Complications: None immediate.  Jay Haile Bosler, MD Pager #: [phone]   

## 2023-10-18 NOTE — Progress Notes (Signed)
Per Dr. Truett Perna: Patient does not need labs on 11/01--OK to treat on labs from 10/10/23.

## 2023-10-19 ENCOUNTER — Other Ambulatory Visit: Payer: Self-pay | Admitting: Oncology

## 2023-10-19 ENCOUNTER — Inpatient Hospital Stay: Payer: BC Managed Care – PPO | Attending: Oncology

## 2023-10-19 VITALS — BP 138/86 | HR 79 | Temp 97.6°F | Resp 18

## 2023-10-19 DIAGNOSIS — C210 Malignant neoplasm of anus, unspecified: Secondary | ICD-10-CM | POA: Diagnosis present

## 2023-10-19 DIAGNOSIS — Z5111 Encounter for antineoplastic chemotherapy: Secondary | ICD-10-CM | POA: Diagnosis present

## 2023-10-19 DIAGNOSIS — Z79899 Other long term (current) drug therapy: Secondary | ICD-10-CM | POA: Insufficient documentation

## 2023-10-19 DIAGNOSIS — Z5112 Encounter for antineoplastic immunotherapy: Secondary | ICD-10-CM | POA: Insufficient documentation

## 2023-10-19 MED ORDER — SODIUM CHLORIDE 0.9 % IV SOLN
150.0000 mg | Freq: Once | INTRAVENOUS | Status: AC
  Administered 2023-10-19: 150 mg via INTRAVENOUS
  Filled 2023-10-19: qty 150

## 2023-10-19 MED ORDER — SODIUM CHLORIDE 0.9 % IV SOLN
80.0000 mg/m2 | Freq: Once | INTRAVENOUS | Status: AC
  Administered 2023-10-19: 156 mg via INTRAVENOUS
  Filled 2023-10-19: qty 26

## 2023-10-19 MED ORDER — SODIUM CHLORIDE 0.9 % IV SOLN
512.0000 mg | Freq: Once | INTRAVENOUS | Status: AC
  Administered 2023-10-19: 510 mg via INTRAVENOUS
  Filled 2023-10-19: qty 51

## 2023-10-19 MED ORDER — SODIUM CHLORIDE 0.9 % IV SOLN
INTRAVENOUS | Status: DC
Start: 2023-10-19 — End: 2023-10-19

## 2023-10-19 MED ORDER — FAMOTIDINE IN NACL 20-0.9 MG/50ML-% IV SOLN
20.0000 mg | Freq: Once | INTRAVENOUS | Status: AC
  Administered 2023-10-19: 20 mg via INTRAVENOUS
  Filled 2023-10-19: qty 50

## 2023-10-19 MED ORDER — HEPARIN SOD (PORK) LOCK FLUSH 100 UNIT/ML IV SOLN
500.0000 [IU] | Freq: Once | INTRAVENOUS | Status: AC | PRN
  Administered 2023-10-19: 500 [IU]

## 2023-10-19 MED ORDER — DIPHENHYDRAMINE HCL 50 MG/ML IJ SOLN
50.0000 mg | Freq: Once | INTRAMUSCULAR | Status: AC
  Administered 2023-10-19: 50 mg via INTRAVENOUS
  Filled 2023-10-19: qty 1

## 2023-10-19 MED ORDER — SODIUM CHLORIDE 0.9% FLUSH
10.0000 mL | INTRAVENOUS | Status: DC | PRN
  Administered 2023-10-19: 10 mL

## 2023-10-19 MED ORDER — PALONOSETRON HCL INJECTION 0.25 MG/5ML
0.2500 mg | Freq: Once | INTRAVENOUS | Status: AC
  Administered 2023-10-19: 0.25 mg via INTRAVENOUS
  Filled 2023-10-19: qty 5

## 2023-10-19 MED ORDER — DEXAMETHASONE SODIUM PHOSPHATE 10 MG/ML IJ SOLN
10.0000 mg | Freq: Once | INTRAMUSCULAR | Status: AC
  Administered 2023-10-19: 10 mg via INTRAVENOUS
  Filled 2023-10-19: qty 1

## 2023-10-19 NOTE — Patient Instructions (Signed)
[place] [facility]   Discharge Instructions: Thank you for choosing [facility] to provide your oncology and hematology care.   If you have a lab appointment with the Cancer Center, please go directly to the Cancer Center and check in at the registration area.   Wear comfortable clothing and clothing appropriate for easy access to any Portacath or PICC line.   We strive to give you quality time with your provider. You may need to reschedule your appointment if you arrive late (15 or more minutes).  Arriving late affects you and other patients whose appointments are after yours.  Also, if you miss three or more appointments without notifying the office, you may be dismissed from the clinic at the provider's discretion.      For prescription refill requests, have your pharmacy contact our office and allow 72 hours for refills to be completed.    Today you received the following chemotherapy and/or immunotherapy agents TAXOL/CARBOPLATIN      To help prevent nausea and vomiting after your treatment, we encourage you to take your nausea medication as directed.  BELOW ARE SYMPTOMS THAT SHOULD BE REPORTED IMMEDIATELY: *FEVER GREATER THAN 100.4 F (38 C) OR HIGHER *CHILLS OR SWEATING *NAUSEA AND VOMITING THAT IS NOT CONTROLLED WITH YOUR NAUSEA MEDICATION *UNUSUAL SHORTNESS OF BREATH *UNUSUAL BRUISING OR BLEEDING *URINARY PROBLEMS (pain or burning when urinating, or frequent urination) *BOWEL PROBLEMS (unusual diarrhea, constipation, pain near the anus) TENDERNESS IN MOUTH AND THROAT WITH OR WITHOUT PRESENCE OF ULCERS (sore throat, sores in mouth, or a toothache) UNUSUAL RASH, SWELLING OR PAIN  UNUSUAL VAGINAL DISCHARGE OR ITCHING   Items with * indicate a potential emergency and should be followed up as soon as possible or go to the Emergency Department if any problems should occur.  Please show the CHEMOTHERAPY ALERT CARD or IMMUNOTHERAPY ALERT CARD  at check-in to the Emergency Department and triage nurse.  Should you have questions after your visit or need to cancel or reschedule your appointment, please contact [place] [facility] AT [facility]  Dept: [phone]  and follow the prompts.  Office hours are 8:00 a.m. to 4:30 p.m. Monday - Friday. Please note that voicemails left after 4:00 p.m. may not be returned until the following business day.  We are closed weekends and major holidays. You have access to a nurse at all times for urgent questions. Please call the main number to the clinic Dept: [phone] and follow the prompts.   For any non-urgent questions, you may also contact your provider using MyChart. We now offer e-Visits for anyone 11 and older to request care online for non-urgent symptoms. For details visit [email or web address].   Also download the MyChart app! Go to the app store, search "MyChart", open the app, select [place], and log in with your MyChart username and password. Paclitaxel Injection What is this medication? PACLITAXEL (PAK li TAX el) treats some types of cancer. It works by slowing down the growth of cancer cells. This medicine may be used for other purposes; ask your health care provider or pharmacist if you have questions. COMMON BRAND NAME(S): Onxol, Taxol What should I tell my care team before I take this medication? They need to know if you have any of these conditions: Heart disease Liver disease Low white blood cell levels An unusual or allergic reaction to paclitaxel, other medications, foods, dyes, or preservatives If you or your partner are pregnant or trying to get pregnant Breast-feeding  How should I use this medication? This medication is injected into a vein. It is given by your care team in a hospital or clinic setting. Talk to your care team about the use of this medication in children. While it may be given to children for selected conditions, precautions do  apply. Overdosage: If you think you have taken too much of this medicine contact a poison control center or emergency room at once. NOTE: This medicine is only for you. Do not share this medicine with others. What if I miss a dose? Keep appointments for follow-up doses. It is important not to miss your dose. Call your care team if you are unable to keep an appointment. What may interact with this medication? Do not take this medication with any of the following: Live virus vaccines Other medications may affect the way this medication works. Talk with your care team about all of the medications you take. They may suggest changes to your treatment plan to lower the risk of side effects and to make sure your medications work as intended. This list may not describe all possible interactions. Give your health care provider a list of all the medicines, herbs, non-prescription drugs, or dietary supplements you use. Also tell them if you smoke, drink alcohol, or use illegal drugs. Some items may interact with your medicine. What should I watch for while using this medication? Your condition will be monitored carefully while you are receiving this medication. You may need blood work while taking this medication. This medication may make you feel generally unwell. This is not uncommon as chemotherapy can affect healthy cells as well as cancer cells. Report any side effects. Continue your course of treatment even though you feel ill unless your care team tells you to stop. This medication can cause serious allergic reactions. To reduce the risk, your care team may give you other medications to take before receiving this one. Be sure to follow the directions from your care team. This medication may increase your risk of getting an infection. Call your care team for advice if you get a fever, chills, sore throat, or other symptoms of a cold or flu. Do not treat yourself. Try to avoid being around people who are  sick. This medication may increase your risk to bruise or bleed. Call your care team if you notice any unusual bleeding. Be careful brushing or flossing your teeth or using a toothpick because you may get an infection or bleed more easily. If you have any dental work done, tell your dentist you are receiving this medication. Talk to your care team if you may be pregnant. Serious birth defects can occur if you take this medication during pregnancy. Talk to your care team before breastfeeding. Changes to your treatment plan may be needed. What side effects may I notice from receiving this medication? Side effects that you should report to your care team as soon as possible: Allergic reactions--skin rash, itching, hives, swelling of the face, lips, tongue, or throat Heart rhythm changes--fast or irregular heartbeat, dizziness, feeling faint or lightheaded, chest pain, trouble breathing Increase in blood pressure Infection--fever, chills, cough, sore throat, wounds that don't heal, pain or trouble when passing urine, general feeling of discomfort or being unwell Low blood pressure--dizziness, feeling faint or lightheaded, blurry vision Low red blood cell level--unusual weakness or fatigue, dizziness, headache, trouble breathing Painful swelling, warmth, or redness of the skin, blisters or sores at the infusion site Pain, tingling, or numbness in the hands  or feet Slow heartbeat--dizziness, feeling faint or lightheaded, confusion, trouble breathing, unusual weakness or fatigue Unusual bruising or bleeding Side effects that usually do not require medical attention (report to your care team if they continue or are bothersome): Diarrhea Hair loss Joint pain Loss of appetite Muscle pain Nausea Vomiting This list may not describe all possible side effects. Call your doctor for medical advice about side effects. You may report side effects to FDA at 1-800-FDA-1088. Where should I keep my  medication? This medication is given in a hospital or clinic. It will not be stored at home. NOTE: This sheet is a summary. It may not cover all possible information. If you have questions about this medicine, talk to your doctor, pharmacist, or health care provider.  2024 Elsevier/Gold Standard (2022-04-25 00:00:00) Carboplatin Injection What is this medication? CARBOPLATIN (KAR boe pla tin) treats some types of cancer. It works by slowing down the growth of cancer cells. This medicine may be used for other purposes; ask your health care provider or pharmacist if you have questions. COMMON BRAND NAME(S): Paraplatin What should I tell my care team before I take this medication? They need to know if you have any of these conditions: Blood disorders Hearing problems Kidney disease Recent or ongoing radiation therapy An unusual or allergic reaction to carboplatin, cisplatin, other medications, foods, dyes, or preservatives Pregnant or trying to get pregnant Breast-feeding How should I use this medication? This medication is injected into a vein. It is given by your care team in a hospital or clinic setting. Talk to your care team about the use of this medication in children. Special care may be needed. Overdosage: If you think you have taken too much of this medicine contact a poison control center or emergency room at once. NOTE: This medicine is only for you. Do not share this medicine with others. What if I miss a dose? Keep appointments for follow-up doses. It is important not to miss your dose. Call your care team if you are unable to keep an appointment. What may interact with this medication? Medications for seizures Some antibiotics, such as amikacin, gentamicin, neomycin, streptomycin, tobramycin Vaccines This list may not describe all possible interactions. Give your health care provider a list of all the medicines, herbs, non-prescription drugs, or dietary supplements you use.  Also tell them if you smoke, drink alcohol, or use illegal drugs. Some items may interact with your medicine. What should I watch for while using this medication? Your condition will be monitored carefully while you are receiving this medication. You may need blood work while taking this medication. This medication may make you feel generally unwell. This is not uncommon, as chemotherapy can affect healthy cells as well as cancer cells. Report any side effects. Continue your course of treatment even though you feel ill unless your care team tells you to stop. In some cases, you may be given additional medications to help with side effects. Follow all directions for their use. This medication may increase your risk of getting an infection. Call your care team for advice if you get a fever, chills, sore throat, or other symptoms of a cold or flu. Do not treat yourself. Try to avoid being around people who are sick. Avoid taking medications that contain aspirin, acetaminophen, ibuprofen, naproxen, or ketoprofen unless instructed by your care team. These medications may hide a fever. Be careful brushing or flossing your teeth or using a toothpick because you may get an infection or bleed more  easily. If you have any dental work done, tell your dentist you are receiving this medication. Talk to your care team if you wish to become pregnant or think you might be pregnant. This medication can cause serious birth defects. Talk to your care team about effective forms of contraception. Do not breast-feed while taking this medication. What side effects may I notice from receiving this medication? Side effects that you should report to your care team as soon as possible: Allergic reactions--skin rash, itching, hives, swelling of the face, lips, tongue, or throat Infection--fever, chills, cough, sore throat, wounds that don't heal, pain or trouble when passing urine, general feeling of discomfort or being  unwell Low red blood cell level--unusual weakness or fatigue, dizziness, headache, trouble breathing Pain, tingling, or numbness in the hands or feet, muscle weakness, change in vision, confusion or trouble speaking, loss of balance or coordination, trouble walking, seizures Unusual bruising or bleeding Side effects that usually do not require medical attention (report to your care team if they continue or are bothersome): Hair loss Nausea Unusual weakness or fatigue Vomiting This list may not describe all possible side effects. Call your doctor for medical advice about side effects. You may report side effects to FDA at 1-800-FDA-1088. Where should I keep my medication? This medication is given in a hospital or clinic. It will not be stored at home. NOTE: This sheet is a summary. It may not cover all possible information. If you have questions about this medicine, talk to your doctor, pharmacist, or health care provider.  2024 Elsevier/Gold Standard (2022-03-28 00:00:00)

## 2023-10-21 ENCOUNTER — Other Ambulatory Visit: Payer: Self-pay | Admitting: Oncology

## 2023-10-22 ENCOUNTER — Telehealth: Payer: Self-pay | Admitting: Genetic Counselor

## 2023-10-22 ENCOUNTER — Telehealth: Payer: Self-pay

## 2023-10-22 NOTE — Telephone Encounter (Signed)
Called patient for first time chemo follow-up. Patient stated she tolerated treatment well, reporting mild nausea this morning that went away after taking her home nausea medication.  Reported blood glucose has been running high since her treatment on Friday (11/1) and thinks it may be caused by the steroids she was given as part of her treatment.  Patient stated she has an appointment for 10/23/23 to see PCP regarding blood glucose levels.  Confirmed with patient that the elevated blood sugars may be due to the steroids and informed her that she will most likely be receiving a dose of steroids prior to each treatment.  Instructed patient to proceed her appointment with her PCP to see if they recommend adjusting her current glucose management regimen. Informed patient that Dr. Truett Perna will be made aware of elevated glucose levels and encouraged patient to discuss any questions or concerns with him at her next office visit on 10/26/23.  Patient verbalized understanding and agreed to contact office if she had any new questions or concerns.

## 2023-10-22 NOTE — Telephone Encounter (Signed)
Patient called asking about FH questionnaire to prepare for appt.  Told patient that we will collect info during session and encouraged her to come with information about the type of cancer family members had and ages of dx.

## 2023-10-26 ENCOUNTER — Inpatient Hospital Stay (HOSPITAL_BASED_OUTPATIENT_CLINIC_OR_DEPARTMENT_OTHER): Payer: BC Managed Care – PPO | Admitting: Oncology

## 2023-10-26 ENCOUNTER — Encounter: Payer: Self-pay | Admitting: *Deleted

## 2023-10-26 ENCOUNTER — Inpatient Hospital Stay: Payer: BC Managed Care – PPO

## 2023-10-26 ENCOUNTER — Encounter: Payer: Self-pay | Admitting: Oncology

## 2023-10-26 VITALS — BP 122/96 | HR 100 | Temp 98.3°F | Resp 20 | Ht 67.0 in | Wt 173.2 lb

## 2023-10-26 VITALS — BP 130/96

## 2023-10-26 DIAGNOSIS — C210 Malignant neoplasm of anus, unspecified: Secondary | ICD-10-CM | POA: Diagnosis not present

## 2023-10-26 LAB — CMP (CANCER CENTER ONLY)
ALT: 278 U/L (ref 0–44)
AST: 116 U/L — ABNORMAL HIGH (ref 15–41)
Albumin: 4.5 g/dL (ref 3.5–5.0)
Alkaline Phosphatase: 82 U/L (ref 38–126)
Anion gap: 7 (ref 5–15)
BUN: 13 mg/dL (ref 6–20)
CO2: 26 mmol/L (ref 22–32)
Calcium: 9.7 mg/dL (ref 8.9–10.3)
Chloride: 101 mmol/L (ref 98–111)
Creatinine: 0.56 mg/dL (ref 0.44–1.00)
GFR, Estimated: >60 mL/min (ref >60)
Glucose, Bld: 199 mg/dL — ABNORMAL HIGH (ref 70–99)
Potassium: 4.2 mmol/L (ref 3.5–5.1)
Sodium: 134 mmol/L — ABNORMAL LOW (ref 135–145)
Total Bilirubin: 0.5 mg/dL (ref <1.2)
Total Protein: 7.4 g/dL (ref 6.5–8.1)

## 2023-10-26 LAB — CBC WITH DIFFERENTIAL (CANCER CENTER ONLY)
Abs Immature Granulocytes: 0.01 10*3/uL (ref 0.00–0.07)
Basophils Absolute: 0 10*3/uL (ref 0.0–0.1)
Basophils Relative: 1 %
Eosinophils Absolute: 0.1 10*3/uL (ref 0.0–0.5)
Eosinophils Relative: 4 %
HCT: 40 % (ref 36.0–46.0)
Hemoglobin: 13.6 g/dL (ref 12.0–15.0)
Immature Granulocytes: 0 %
Lymphocytes Relative: 23 %
Lymphs Abs: 0.8 10*3/uL (ref 0.7–4.0)
MCH: 33.4 pg (ref 26.0–34.0)
MCHC: 34 g/dL (ref 30.0–36.0)
MCV: 98.3 fL (ref 80.0–100.0)
Monocytes Absolute: 0.2 10*3/uL (ref 0.1–1.0)
Monocytes Relative: 6 %
Neutro Abs: 2.2 10*3/uL (ref 1.7–7.7)
Neutrophils Relative %: 66 %
Platelet Count: 123 10*3/uL — ABNORMAL LOW (ref 150–400)
RBC: 4.07 MIL/uL (ref 3.87–5.11)
RDW: 12 % (ref 11.5–15.5)
WBC Count: 3.3 10*3/uL — ABNORMAL LOW (ref 4.0–10.5)
nRBC: 0 % (ref 0.0–0.2)

## 2023-10-26 MED ORDER — DEXAMETHASONE SODIUM PHOSPHATE 10 MG/ML IJ SOLN
10.0000 mg | Freq: Once | INTRAMUSCULAR | Status: AC
Start: 2023-10-26 — End: 2023-10-26
  Administered 2023-10-26: 10 mg via INTRAVENOUS
  Filled 2023-10-26: qty 1

## 2023-10-26 MED ORDER — FAMOTIDINE IN NACL 20-0.9 MG/50ML-% IV SOLN
20.0000 mg | Freq: Once | INTRAVENOUS | Status: AC
Start: 2023-10-26 — End: 2023-10-26
  Administered 2023-10-26: 20 mg via INTRAVENOUS
  Filled 2023-10-26: qty 50

## 2023-10-26 MED ORDER — SODIUM CHLORIDE 0.9 % IV SOLN
80.0000 mg/m2 | Freq: Once | INTRAVENOUS | Status: AC
  Administered 2023-10-26: 156 mg via INTRAVENOUS
  Filled 2023-10-26: qty 26

## 2023-10-26 MED ORDER — SODIUM CHLORIDE 0.9 % IV SOLN: INTRAVENOUS | Status: DC

## 2023-10-26 MED ORDER — DIPHENHYDRAMINE HCL 50 MG/ML IJ SOLN
25.0000 mg | Freq: Once | INTRAMUSCULAR | Status: AC
Start: 2023-10-26 — End: 2023-10-26
  Administered 2023-10-26: 25 mg via INTRAVENOUS
  Filled 2023-10-26: qty 1

## 2023-10-26 MED ORDER — HEPARIN SOD (PORK) LOCK FLUSH 100 UNIT/ML IV SOLN
500.0000 [IU] | Freq: Once | INTRAVENOUS | Status: AC | PRN
Start: 2023-10-26 — End: 2023-10-26
  Administered 2023-10-26: 500 [IU]

## 2023-10-26 MED ORDER — SODIUM CHLORIDE 0.9% FLUSH
10.0000 mL | INTRAVENOUS | Status: DC | PRN
  Administered 2023-10-26: 10 mL

## 2023-10-26 NOTE — Progress Notes (Signed)
Patient seen by Dr. Thornton Papas today  Vitals are within treatment parameters:No (Please specify and give further instructions.)BP 122/96--OK to proceed w/treatment  Labs are within treatment parameters: No (Please specify and give further instructions.) AST 116 and ALT 278--OK to proceed  Treatment plan has been signed: Yes   Per physician team, Patient is ready for treatment and there are NO modifications to the treatment plan.   CRITICAL VALUE STICKER  CRITICAL VALUE: ALT 278  RECEIVER (on-site recipient of call):Lamel Mccarley,RN  DATE & TIME NOTIFIED: 10/26/23 @ 0856  MESSENGER (representative from lab):Marchelle Folks  MD NOTIFIED: Dr. Truett Perna  TIME OF NOTIFICATION: 0902  RESPONSE:  F/U with pharmacy regarding treating w/Taxol today.

## 2023-10-26 NOTE — Patient Instructions (Signed)

## 2023-10-26 NOTE — Patient Instructions (Signed)
[place] [facility] - A DEPT OF [facility]  Discharge Instructions: Thank you for choosing [facility] to provide your oncology and hematology care.   If you have a lab appointment with the Cancer Center, please go directly to the Cancer Center and check in at the registration area.   Wear comfortable clothing and clothing appropriate for easy access to any Portacath or PICC line.   We strive to give you quality time with your provider. You may need to reschedule your appointment if you arrive late (15 or more minutes).  Arriving late affects you and other patients whose appointments are after yours.  Also, if you miss three or more appointments without notifying the office, you may be dismissed from the clinic at the provider's discretion.      For prescription refill requests, have your pharmacy contact our office and allow 72 hours for refills to be completed.    Today you received the following chemotherapy and/or immunotherapy agents: paclitaxel      To help prevent nausea and vomiting after your treatment, we encourage you to take your nausea medication as directed.  BELOW ARE SYMPTOMS THAT SHOULD BE REPORTED IMMEDIATELY: *FEVER GREATER THAN 100.4 F (38 C) OR HIGHER *CHILLS OR SWEATING *NAUSEA AND VOMITING THAT IS NOT CONTROLLED WITH YOUR NAUSEA MEDICATION *UNUSUAL SHORTNESS OF BREATH *UNUSUAL BRUISING OR BLEEDING *URINARY PROBLEMS (pain or burning when urinating, or frequent urination) *BOWEL PROBLEMS (unusual diarrhea, constipation, pain near the anus) TENDERNESS IN MOUTH AND THROAT WITH OR WITHOUT PRESENCE OF ULCERS (sore throat, sores in mouth, or a toothache) UNUSUAL RASH, SWELLING OR PAIN  UNUSUAL VAGINAL DISCHARGE OR ITCHING   Items with * indicate a potential emergency and should be followed up as soon as possible or go to the Emergency Department if any problems should occur.  Please show the CHEMOTHERAPY ALERT CARD or  IMMUNOTHERAPY ALERT CARD at check-in to the Emergency Department and triage nurse.  Should you have questions after your visit or need to cancel or reschedule your appointment, please contact [place] [facility] - [facility]  Dept: [phone]  and follow the prompts.  Office hours are 8:00 a.m. to 4:30 p.m. Monday - Friday. Please note that voicemails left after 4:00 p.m. may not be returned until the following business day.  We are closed weekends and major holidays. You have access to a nurse at all times for urgent questions. Please call the main number to the clinic Dept: [phone] and follow the prompts.   For any non-urgent questions, you may also contact your provider using MyChart. We now offer e-Visits for anyone 68 and older to request care online for non-urgent symptoms. For details visit [email or web address].   Also download the MyChart app! Go to the app store, search "MyChart", open the app, select [place], and log in with your MyChart username and password.  Paclitaxel Injection What is this medication? PACLITAXEL (PAK li TAX el) treats some types of cancer. It works by slowing down the growth of cancer cells. This medicine may be used for other purposes; ask your health care provider or pharmacist if you have questions. COMMON BRAND NAME(S): Onxol, Taxol What should I tell my care team before I take this medication? They need to know if you have any of these conditions: Heart disease Liver disease Low white blood cell levels An unusual or allergic reaction to paclitaxel, other medications, foods, dyes, or preservatives If  you or your partner are pregnant or trying to get pregnant Breast-feeding How should I use this medication? This medication is injected into a vein. It is given by your care team in a hospital or clinic setting. Talk to your care team about the use of this medication in children. While it may be given to  children for selected conditions, precautions do apply. Overdosage: If you think you have taken too much of this medicine contact a poison control center or emergency room at once. NOTE: This medicine is only for you. Do not share this medicine with others. What if I miss a dose? Keep appointments for follow-up doses. It is important not to miss your dose. Call your care team if you are unable to keep an appointment. What may interact with this medication? Do not take this medication with any of the following: Live virus vaccines Other medications may affect the way this medication works. Talk with your care team about all of the medications you take. They may suggest changes to your treatment plan to lower the risk of side effects and to make sure your medications work as intended. This list may not describe all possible interactions. Give your health care provider a list of all the medicines, herbs, non-prescription drugs, or dietary supplements you use. Also tell them if you smoke, drink alcohol, or use illegal drugs. Some items may interact with your medicine. What should I watch for while using this medication? Your condition will be monitored carefully while you are receiving this medication. You may need blood work while taking this medication. This medication may make you feel generally unwell. This is not uncommon as chemotherapy can affect healthy cells as well as cancer cells. Report any side effects. Continue your course of treatment even though you feel ill unless your care team tells you to stop. This medication can cause serious allergic reactions. To reduce the risk, your care team may give you other medications to take before receiving this one. Be sure to follow the directions from your care team. This medication may increase your risk of getting an infection. Call your care team for advice if you get a fever, chills, sore throat, or other symptoms of a cold or flu. Do not treat  yourself. Try to avoid being around people who are sick. This medication may increase your risk to bruise or bleed. Call your care team if you notice any unusual bleeding. Be careful brushing or flossing your teeth or using a toothpick because you may get an infection or bleed more easily. If you have any dental work done, tell your dentist you are receiving this medication. Talk to your care team if you may be pregnant. Serious birth defects can occur if you take this medication during pregnancy. Talk to your care team before breastfeeding. Changes to your treatment plan may be needed. What side effects may I notice from receiving this medication? Side effects that you should report to your care team as soon as possible: Allergic reactions--skin rash, itching, hives, swelling of the face, lips, tongue, or throat Heart rhythm changes--fast or irregular heartbeat, dizziness, feeling faint or lightheaded, chest pain, trouble breathing Increase in blood pressure Infection--fever, chills, cough, sore throat, wounds that don't heal, pain or trouble when passing urine, general feeling of discomfort or being unwell Low blood pressure--dizziness, feeling faint or lightheaded, blurry vision Low red blood cell level--unusual weakness or fatigue, dizziness, headache, trouble breathing Painful swelling, warmth, or redness of the skin, blisters or  sores at the infusion site Pain, tingling, or numbness in the hands or feet Slow heartbeat--dizziness, feeling faint or lightheaded, confusion, trouble breathing, unusual weakness or fatigue Unusual bruising or bleeding Side effects that usually do not require medical attention (report to your care team if they continue or are bothersome): Diarrhea Hair loss Joint pain Loss of appetite Muscle pain Nausea Vomiting This list may not describe all possible side effects. Call your doctor for medical advice about side effects. You may report side effects to FDA at  1-800-FDA-1088. Where should I keep my medication? This medication is given in a hospital or clinic. It will not be stored at home. NOTE: This sheet is a summary. It may not cover all possible information. If you have questions about this medicine, talk to your doctor, pharmacist, or health care provider.  2024 Elsevier/Gold Standard (2022-04-25 00:00:00)

## 2023-10-26 NOTE — Progress Notes (Signed)
[facility] OFFICE PROGRESS NOTE   Diagnosis: Anal cancer  INTERVAL HISTORY:   Beth [name] completed day 1 Taxol/carboplatin 10/19/2023.  She tolerated the treatment well.  She reports mild nausea relieved with Compazine.  No symptom of allergic reaction.  No neuropathy symptoms.  She continues to have right abdominal pain.  She reports her blood sugar was elevated to greater than 300 after chemotherapy.  Her primary provider increased her insulin dose.  Objective:  Vital signs in last 24 hours:  Blood pressure (!) 122/96, pulse 100, temperature 98.3 F (36.8 C), temperature source Temporal, resp. rate 20, height 5\' 7"  (1.702 m), weight 173 lb 3.2 oz (78.6 kg), last menstrual period 03/13/2012, SpO2 100%.    HEENT: No thrush or ulcers Resp: Lungs clear bilaterally Cardio: Regular rate and rhythm GI: No hepatosplenomegaly, left lower quadrant colostomy, tender in the right upper abdomen with small area of firm nodularity on deep palpation Vascular: No leg edema   Portacath/PICC-without erythema  Lab Results:  Lab Results  Component Value Date   WBC 3.3 (L) 10/26/2023   HGB 13.6 10/26/2023   HCT 40.0 10/26/2023   MCV 98.3 10/26/2023   PLT 123 (L) 10/26/2023   NEUTROABS 2.2 10/26/2023    CMP  Lab Results  Component Value Date   NA 137 10/10/2023   K 4.3 10/10/2023   CL 99 10/10/2023   CO2 32 10/10/2023   GLUCOSE 253 (H) 10/10/2023   BUN 10 10/10/2023   CREATININE 0.69 10/10/2023   CALCIUM 10.3 10/10/2023   PROT 8.0 10/10/2023   ALBUMIN 4.9 10/10/2023   AST 105 (H) 10/10/2023   ALT 187 (H) 10/10/2023   ALKPHOS 91 10/10/2023   BILITOT 0.7 10/10/2023   GFRNONAA >60 10/10/2023   GFRAA 106 11/29/2020    Lab Results  Component Value Date   CEA 1.39 10/10/2023    Lab Results  Component Value Date   INR 1.1 09/07/2023   LABPROT 14.1 09/07/2023    Imaging:  No results found.  Medications: I have reviewed the patient's current  medications.   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  Small adjacent lower perirectal lymph nodes in addition to a 0.5 cm sacral node.  Focal enhancing lesion in the right hepatic lobe 1.2 x 0.8 x 0.9 cm.  2.1 x 1.5 cm cystic lesion right ovary with thin enhancing rim. PET scan 03/16/2021-hypermetabolic mass within the anal canal.  No definite evidence of local or distant metastatic disease.  7 mm left submandibular lymph node with mild FDG uptake, likely reactive.  Focal site of FDG uptake within the inner quadrant of the right breast. Radiation 03/21/2021-04/27/2021 Cycle one 5-FU/mitomycin-C 03/21/2021 Cycle two 5-FU/Mitomycin-C 04/18/2021, 5-FU dose reduced secondary to mucositis Anoscopy by Dr. Michaell Cowing 07/20/2021-divet in the right posterior lateral aspect with some right anterior and right lateral folds consistent with radiation treatment.  No  ulceration.  Not fully resolved but markedly shrunk down. Anorectal mass 09/30/2021 examination under anesthesia, biopsy-poorly differentiated carcinoma-basaloid squamous carcinoma and high-grade neuroendocrine carcinoma, margins positive APR/and vertical rectus abdominis flap 11/03/2021,  invasive poorly differentiated carcinoma, negative resection margins, 0/10 nodes, treatment response score 2,ypT1,ypN0 CT abdomen/pelvis 12/03/2021-high-grade small bowel obstruction transitioning in the left lower abdomen.  Presacral rim-enhancing 4.4 x 2.8 x 3.8 cm fluid collection.  No air in the collection.  Prior study described a potential abnormality in the right lobe of the liver, not seen on current study.  Cystic lesion of the right ovary resolved. CT chest 12/07/2021-interval development of bilateral trace pleural effusions. CTs 12/04/2022-new 2.6 x 2.5 cm right lower lobe nodule PET 12/21/2022-FDG avid right lower lobe nodule, no other evidence of metastatic disease 12/29/2022 right lower lobectomy, lymph node dissection-poorly differentiated carcinoma consistent with metastasis, 4.8 x 2.9 x 2.6 cm, margins free, 7 lymph nodes negative for carcinoma; cytohistomorphology correlation supports the current tumor being a metastasis from the previously diagnosed anal carcinoma CT chest 04/17/2023-interval right lower lobectomy with resection of right lung mass, pleural thickening and small right pleural effusion, no new mass or lymph node enlargement CTs 07/20/2023-diminished postoperative right pleural effusion.  Unchanged postoperative findings status post abdominoperineal resection with left lower quadrant and colostomy.  No evidence of metastatic disease. CT abdomen/pelvis 08/31/2023-heterogenous soft tissue nodule in the right upper quadrant anterior abdominal wall, no evidence of metastatic disease CT/ultrasound guided biopsy of right abdominal wall mass 09/07/2023-metastatic neoplasm consistent with a metastasis from anal cancer, Foundation 1-HRD signature negative, MSS, tumor mutation burden 4, BRCA 1 alteration, PD-L1 TPS 10%   Remote history of an anal fissure repair Tobacco use Moderate alcohol use Eczema Right breast mass- PET scan 03/16/2021 with focal FDG activity in the inner right  breast Mammogram and right breast ultrasound 03/28/2021- negative Bilateral breast MRI 03/31/2021- 7 mm mass in the upper inner right breast correlating with the PET findings, no other abnormality, no adenopathy MRI guided biopsy of right breast mass 04/11/2021- fibrocystic change with sclerosing adenosis, small focus of inflammation/fibrosis suggestive of resolving fat necrosis, no evidence of malignancy--repeat bilateral breast MRI at a 30-month interval; bilateral breast MRI on 10/13/2021-no evidence of breast malignancy.  Annual screening mammography recommended.   7.  Admission with small bowel obstruction 12/03/2021 status post exploratory laparotomy and lysis of adhesions 8.  Right abdominal pain/tenderness-potentially related to the right abdominal wall mass noted on CT 08/31/2023 9.  Elevated liver enzymes-chronic, etiology unclear 10.  Hematuria-evaluated by urology 09/11/2023, cystoscopy with radiation and infection changes, no tumor seen        Disposition: Ms. Mcanany tolerated the first cycle of paclitaxel/carboplatin well.  She will complete day 8 paclitaxel today.  She received Decadron prior to and on the day of paclitaxel.  Her blood sugar was more elevated.  She will receive only 1 dose of Decadron with this cycle.  She will contact us for marked elevation of the blood sugar following this cycle.  We will decrease the Decadron further if needed.  She will return for day 15 paclitaxel in 1 week.  Cycle 2 chemotherapy will be scheduled for 11/14/2023.Retifanlimab has been authorized and will be scheduled for day 1 cycle 2.  Thornton Papas, MD  10/26/2023  8:30 AM

## 2023-10-28 ENCOUNTER — Other Ambulatory Visit: Payer: Self-pay

## 2023-10-29 ENCOUNTER — Encounter: Payer: Self-pay | Admitting: Oncology

## 2023-11-02 ENCOUNTER — Inpatient Hospital Stay: Payer: BC Managed Care – PPO

## 2023-11-02 DIAGNOSIS — C210 Malignant neoplasm of anus, unspecified: Secondary | ICD-10-CM | POA: Diagnosis not present

## 2023-11-02 LAB — CMP (CANCER CENTER ONLY)
ALT: 159 U/L — ABNORMAL HIGH (ref 0–44)
AST: 64 U/L — ABNORMAL HIGH (ref 15–41)
Albumin: 4.4 g/dL (ref 3.5–5.0)
Alkaline Phosphatase: 100 U/L (ref 38–126)
Anion gap: 6 (ref 5–15)
BUN: 8 mg/dL (ref 6–20)
CO2: 26 mmol/L (ref 22–32)
Calcium: 9.3 mg/dL (ref 8.9–10.3)
Chloride: 102 mmol/L (ref 98–111)
Creatinine: 0.57 mg/dL (ref 0.44–1.00)
GFR, Estimated: >60 mL/min (ref >60)
Glucose, Bld: 210 mg/dL — ABNORMAL HIGH (ref 70–99)
Potassium: 4.2 mmol/L (ref 3.5–5.1)
Sodium: 134 mmol/L — ABNORMAL LOW (ref 135–145)
Total Bilirubin: 0.6 mg/dL (ref <1.2)
Total Protein: 6.9 g/dL (ref 6.5–8.1)

## 2023-11-02 LAB — CBC WITH DIFFERENTIAL (CANCER CENTER ONLY)
Abs Immature Granulocytes: 0.02 10*3/uL (ref 0.00–0.07)
Basophils Absolute: 0 10*3/uL (ref 0.0–0.1)
Basophils Relative: 1 %
Eosinophils Absolute: 0.1 10*3/uL (ref 0.0–0.5)
Eosinophils Relative: 4 %
HCT: 37.5 % (ref 36.0–46.0)
Hemoglobin: 12.9 g/dL (ref 12.0–15.0)
Immature Granulocytes: 1 %
Lymphocytes Relative: 36 %
Lymphs Abs: 0.5 10*3/uL — ABNORMAL LOW (ref 0.7–4.0)
MCH: 33.3 pg (ref 26.0–34.0)
MCHC: 34.4 g/dL (ref 30.0–36.0)
MCV: 96.9 fL (ref 80.0–100.0)
Monocytes Absolute: 0.2 10*3/uL (ref 0.1–1.0)
Monocytes Relative: 17 %
Neutro Abs: 0.6 10*3/uL — ABNORMAL LOW (ref 1.7–7.7)
Neutrophils Relative %: 41 %
Platelet Count: 146 10*3/uL — ABNORMAL LOW (ref 150–400)
RBC: 3.87 MIL/uL (ref 3.87–5.11)
RDW: 12 % (ref 11.5–15.5)
WBC Count: 1.4 10*3/uL — ABNORMAL LOW (ref 4.0–10.5)
nRBC: 0 % (ref 0.0–0.2)

## 2023-11-02 MED ORDER — SODIUM CHLORIDE 0.9% FLUSH
10.0000 mL | Freq: Once | INTRAVENOUS | Status: AC
  Administered 2023-11-02: 10 mL via INTRAVENOUS

## 2023-11-02 MED ORDER — HEPARIN SOD (PORK) LOCK FLUSH 100 UNIT/ML IV SOLN
500.0000 [IU] | Freq: Once | INTRAVENOUS | Status: AC
  Administered 2023-11-02: 500 [IU] via INTRAVENOUS

## 2023-11-02 NOTE — Patient Instructions (Signed)

## 2023-11-02 NOTE — Progress Notes (Signed)
PER DR SHERRILL, SKIP TREATMENT TODAY DUE TO ANC OF 0.6

## 2023-11-02 NOTE — Progress Notes (Signed)
ANC today 0.6--hold treatment this week per Dr. Truett Perna. Return in 1 week for CBC. Not uncommon for patient to not be able to receive Taxol 3 weeks in a row. Will treat as scheduled on 11/14/23

## 2023-11-08 ENCOUNTER — Telehealth: Payer: Self-pay

## 2023-11-08 ENCOUNTER — Inpatient Hospital Stay: Payer: BC Managed Care – PPO

## 2023-11-08 ENCOUNTER — Inpatient Hospital Stay: Payer: BC Managed Care – PPO | Admitting: Genetic Counselor

## 2023-11-08 DIAGNOSIS — Z803 Family history of malignant neoplasm of breast: Secondary | ICD-10-CM

## 2023-11-08 DIAGNOSIS — C210 Malignant neoplasm of anus, unspecified: Secondary | ICD-10-CM

## 2023-11-08 DIAGNOSIS — Z8042 Family history of malignant neoplasm of prostate: Secondary | ICD-10-CM

## 2023-11-08 LAB — CBC WITH DIFFERENTIAL (CANCER CENTER ONLY)
Abs Immature Granulocytes: 0.04 10*3/uL (ref 0.00–0.07)
Basophils Absolute: 0.1 10*3/uL (ref 0.0–0.1)
Basophils Relative: 1 %
Eosinophils Absolute: 0.1 10*3/uL (ref 0.0–0.5)
Eosinophils Relative: 1 %
HCT: 38.9 % (ref 36.0–46.0)
Hemoglobin: 13.6 g/dL (ref 12.0–15.0)
Immature Granulocytes: 1 %
Lymphocytes Relative: 17 %
Lymphs Abs: 0.7 10*3/uL (ref 0.7–4.0)
MCH: 34.7 pg — ABNORMAL HIGH (ref 26.0–34.0)
MCHC: 35 g/dL (ref 30.0–36.0)
MCV: 99.2 fL (ref 80.0–100.0)
Monocytes Absolute: 0.7 10*3/uL (ref 0.1–1.0)
Monocytes Relative: 16 %
Neutro Abs: 2.6 10*3/uL (ref 1.7–7.7)
Neutrophils Relative %: 64 %
Platelet Count: 126 10*3/uL — ABNORMAL LOW (ref 150–400)
RBC: 3.92 MIL/uL (ref 3.87–5.11)
RDW: 12.4 % (ref 11.5–15.5)
WBC Count: 4.2 10*3/uL (ref 4.0–10.5)
nRBC: 0 % (ref 0.0–0.2)

## 2023-11-08 LAB — GENETIC SCREENING ORDER

## 2023-11-08 NOTE — Progress Notes (Addendum)
REFERRING PROVIDER: Ladene Artist, MD [address] [place],  [place] [address]   PRIMARY PROVIDER:  Richardean Chimera, MD  PRIMARY REASON FOR VISIT:  Encounter Diagnoses  Name Primary?   Anal squamous cell carcinoma (HCC) Yes   Family history of breast cancer    Family history of prostate cancer      HISTORY OF PRESENT ILLNESS:   [name], a 52 y.o. female, was seen for a [place] cancer genetics consultation at the request of Dr. Truett Perna due to a personal history of anal cancer with a potential germline finding in BRCA1 by NGS somatic testing.  [name] presents to clinic today to discuss the possibility of a hereditary predisposition to cancer, to discuss genetic testing, and to further clarify her future cancer risks, as well as potential cancer risks for family members.   In 2022, at the age of 57, [name] was diagnosed with squamous cell carcinoma of the anal canal.  In October 2024, [employer] testing on tumor showed BRCA1 c.2663del (p.H878fs*5) at 46.6% variant allele frequency.    CANCER HISTORY:  Oncology History  Squamous cell carcinoma of anal canal (HCC)  02/25/2021 Initial Diagnosis   Squamous cell carcinoma of anal canal (HCC)   03/01/2021 Cancer Staging   Staging form: Anus, AJCC 8th Edition - Clinical: Stage IIA (cT2, cN0, cM0) - Signed by Ladene Artist, MD on 03/01/2021   Anal squamous cell carcinoma (HCC)  03/01/2021 Initial Diagnosis   Anal squamous cell carcinoma (HCC)   03/21/2021 - 04/22/2021 Chemotherapy   Patient is on Treatment Plan : ANUS Mitomycin D1,28 / 5FU D1-4, 28-31 q32d     10/19/2023 -  Chemotherapy   Patient is on Treatment Plan : ANUS Carboplatin + Paclitaxel q28d x 6 cycles        SCREENING/RISK FACTORS:  Most recent mammogram this year.  Breast MRI in 2022. Category c breast density.  2022 breast biopsy: fibrocystic changes and sclerosing adenosis.  Total laparoscopic hysterectomy and bilateral salpingectomy in  2015.  Ovaries intact: yes.  Menarche was at age 33.  First live birth at age 55.  OCP use for approximately  5  years.  HRT use: 0 years.  Past Medical History:  Diagnosis Date   Acne    on face spirolactone for   Anal cancer (HCC) 02/25/2021   Constipation, chronic 08/08/2016   Diabetes mellitus without complication (HCC) 08/2022   Type II   Family history of adverse reaction to anesthesia    Mother has trouble waking takes longer   Herpes simplex    History of migraine    none in years   Wears glasses     Past Surgical History:  Procedure Laterality Date   ABDOMINAL HYSTERECTOMY  2016   partial   COLONOSCOPY     COLONOSCOPY WITH PROPOFOL N/A 01/28/2021   Procedure: COLONOSCOPY WITH PROPOFOL;  Surgeon: Pasty Spillers, MD;  Location: [facility];  Service: Endoscopy;  Laterality: N/A;   EXCISION HYDRADENITIS LABIA Left 02/25/2021   Procedure: EXCISION of  LABIAL LESIOM;  Surgeon: Karie Soda, MD;  Location: [facility];  Service: General;  Laterality: Left;   INTERCOSTAL NERVE BLOCK  12/29/2022   Procedure: INTERCOSTAL NERVE BLOCK;  Surgeon: Loreli Slot, MD;  Location: [facility] OR;  Service: Thoracic;;   IR FLUORO GUIDE CV LINE RIGHT  04/18/2021   IR IMAGING GUIDED PORT INSERTION  10/18/2023   LAPAROTOMY N/A 12/03/2021   Procedure: EXPLORATORY LAPAROTOMY, LYSIS OF  ADHESIONS;  Surgeon: Axel Filler, MD;  Location: [facility] OR;  Service: General;  Laterality: N/A;   LYMPH NODE DISSECTION Right 12/29/2022   Procedure: LYMPH NODE DISSECTION;  Surgeon: Loreli Slot, MD;  Location: [facility] OR;  Service: Thoracic;  Laterality: Right;   OSTOMY N/A 11/03/2021   Procedure: END COLOSTOMY;  Surgeon: Karie Soda, MD;  Location: [facility];  Service: General;  Laterality: N/A;   PECTORALIS FLAP N/A 11/03/2021   Procedure: VERTICAL RECTUS ABDOMINIS MYOCUTANEOUS TO PERINEUM;  Surgeon: Glenna Fellows, MD;  Location: [facility];  Service: Plastics;  Laterality: N/A;    RECTAL BIOPSY N/A 09/30/2021   Procedure: ANORECTAL MASS BIOPSY;  Surgeon: Karie Soda, MD;  Location: [facility];  Service: General;  Laterality: N/A;   RECTAL EXAM UNDER ANESTHESIA N/A 02/25/2021   Procedure: ANORECTAL EXAM UNDER ANESTHESIA;  Surgeon: Karie Soda, MD;  Location: [facility];  Service: General;  Laterality: N/A;   RECTAL EXAM UNDER ANESTHESIA N/A 09/30/2021   Procedure: ANORECTAL EXAMINATION UNDER ANESTHESIA;  Surgeon: Karie Soda, MD;  Location: [facility];  Service: General;  Laterality: N/A;  GEN & LOCAL ANESTHESIA   SPHINCTEROTOMY N/A 08/30/2016   Procedure: LATERAL INTERNAL AND SPHINCTEROTOMY;  Surgeon: Karie Soda, MD;  Location: [facility];  Service: General;  Laterality: N/A;   TONSILLECTOMY  as child   TRANSANAL EXCISION OF RECTAL MASS N/A 02/25/2021   Procedure: TRANSANAL EXCISIONAL  BIOPSY OF ANAL CANAL MASS;  Surgeon: Karie Soda, MD;  Location: [facility];  Service: General;  Laterality: N/A;   XI ROBOT ABDOMINAL PERINEAL RESECTION N/A 11/03/2021   Procedure: XI ROBOT ABDOMINOPERINEAL RESECTION;  Surgeon: Karie Soda, MD;  Location: [facility];  Service: General;  Laterality: N/A;    FAMILY HISTORY:  We obtained a detailed, 4-generation family history.  Significant diagnoses are listed below: Family History  Problem Relation Age of Onset   Breast cancer Paternal Aunt 59       neg GT   Prostate cancer Paternal Uncle        dx unkown age   Skin cancer Maternal Grandfather        face; dx >50   Cancer Other        breast ca in PGM's sister; metastatic prostate cancer in PGM's brother; lung cancer in PGM's sister   Breast cancer Cousin        paternal female cousin; dx before age 81   Cancer Paternal Great-grandfather        liver; PGM's father   Cancer Paternal Great-grandmother        unk GYN; PGM's mother     [name] paternal aunt, who had a history of breast cancer at age 23, had negative genetic testing through [place]  CancerNext+RNAinsight Panel in January 2024.  The [facility] CancerNext+RNAinsight Panel includes sequencing, rearrangement analysis, and RNA analysis for the following 36 genes: APC, ATM, BARD1, BMPR1A, BRCA1, BRCA2, BRIP1, CDH1, CDK4, CDKN2A, CHEK2, DICER1, MLH1, MSH2, MSH6, MUTYH, NBN, NF1, NTHL1, PALB2, PMS2, PTEN, RAD51C, RAD51D, RECQL, SMAD4, SMARCA4, STK11 and TP53 (sequencing and deletion/duplication); AXIN2, HOXB13, MSH3, POLD1 and POLE (sequencing only); EPCAM and GREM1 (deletion/duplication only). RNA data is routinely analyzed for use in variant interpretation for all genes.  She is unaware of any other relatives that hereditary cancer genetic testing.  Other relatives are unavailable for genetic testing at this time.   There is no reported Ashkenazi Jewish ancestry. There is no known consanguinity.  GENETIC COUNSELING ASSESSMENT: Ms. Weesner is a 52  y.o. female with a personal history of anal cancer with a potential germline finding in the BRCA1 gene by tumor testing in addition to a paternal family history of breast and prostate cancer that is somewhat suggestive of a hereditary cancer syndrome. We, therefore, discussed and recommended the following at today's visit.   DISCUSSION: We discussed that 5 - 10% of cancer is hereditary.  Most cases of hereditary breast cancer are associated with mutations in BRCA1/2.  There are other genes that can be associated with hereditary breast, prostate, and other cancer syndromes.  We reviewed the difference between somatic and germline mutations.  We discussed that the BRCA1 gene mutation detected by tumor testing could be germline in origin.  Follow up germline testing is recommended to clarify origin, as germline mutations have implications for personal and familial cancer risks and medical management  We reviewed the breast, ovarian, pancreatic, and prostate cancer risks and management strategies associated with BRCA1 germline mutations.   We reviewed the  characteristics, features and inheritance patterns of hereditary cancer syndromes. We also discussed genetic testing, including the appropriate family members to test, the process of testing, insurance coverage and turn-around-time for results. We discussed the implications of a negative, positive, carrier and/or variant of uncertain significant result. We recommended Ms. Brule pursue genetic testing for a panel that includes BRCA1 and other genes associated with breast, prostate, and GYN cancers, given her paternal family history of cancer.   Ms. Age  was offered a common hereditary cancer panel (~40 genes) and an expanded pan-cancer panel (~70 genes). Ms. Reviere was informed of the benefits and limitations of each panel, including that expanded pan-cancer panels contain genes that do not have clear management guidelines at this point in time.  We also discussed that as the number of genes included on a panel increases, the chances of variants of uncertain significance increases.  After considering the benefits and limitations of each gene panel, Ms. Lombardozzi  elected to have a common hereditary cancers panel through [employer]  The [facility] CancerNext+RNAinsight Panel includes sequencing, rearrangement analysis, and RNA analysis for the following 39 genes: APC, ATM, BAP1, BARD1, BMPR1A, BRCA1, BRCA2, BRIP1, CDH1, CDKN2A, CHEK2, FH, FLCN, MET, MLH1, MSH2, MSH6, MUTYH, NF1, NTHL1, PALB2, PMS2, PTEN, RAD51C, RAD51D, SMAD4, STK11, TP53, TSC1, TSC2, and VHL (sequencing and deletion/duplication); AXIN2, HOXB13, MBD4, MSH3, POLD1 and POLE (sequencing only); EPCAM and GREM1 (deletion/duplication only).   Based on Ms. Villagomez's personal history of a potential germline finding in the BRCA1 detected by tumor testing, she meet NCCN criteria for genetic testing. Other relatives are not available for testing at this time. Despite that she meets criteria, she may still have an out of pocket cost. We discussed that if  her out of pocket cost for testing is over $100, the laboratory should contact her and discuss the self-pay prices and/or patient pay assistance programs.    PLAN: After considering the risks, benefits, and limitations, Ms. Josten provided informed consent to pursue genetic testing and the blood sample was sent to [facility] for analysis of the CancerNext+RNAinsight. Results should be available within approximately 3 weeks, at which point they will be disclosed by telephone to Ms. Trapani, as will any additional recommendations warranted by these results. Ms. Feeley will receive a summary of her genetic counseling visit and a copy of her results once available. This information will also be available in Epic.   Ms. Baz questions were answered to her satisfaction today. Our contact information was provided  should additional questions or concerns arise. Thank you for the referral and allowing [place] to share in the care of your patient.   Kenadie Royce M. Rennie Plowman, MS, [facility] Genetic Counselor [email or web address] (P) [phone]  The patient was seen for a total of 30 minutes in face-to-face genetic counseling.  The patient was seen alone.  Drs. Gunnar Bulla and/or Mosetta Putt were available to discuss this case as needed.    _______________________________________________________________________ For Office Staff:  Number of people involved in session: 1 Was an Intern/ student involved with case: no

## 2023-11-08 NOTE — Telephone Encounter (Signed)
-----   Message from Thornton Papas sent at 11/08/2023  3:18 PM EST ----- Please call patient with a white count is higher, follow-up as scheduled

## 2023-11-08 NOTE — Telephone Encounter (Signed)
Patient gave verbal understanding and had no further questions or concerns  

## 2023-11-09 ENCOUNTER — Encounter: Payer: Self-pay | Admitting: Oncology

## 2023-11-09 ENCOUNTER — Encounter: Payer: Self-pay | Admitting: Genetic Counselor

## 2023-11-10 ENCOUNTER — Other Ambulatory Visit: Payer: Self-pay | Admitting: Oncology

## 2023-11-14 ENCOUNTER — Encounter: Payer: Self-pay | Admitting: Nurse Practitioner

## 2023-11-14 ENCOUNTER — Inpatient Hospital Stay: Payer: BC Managed Care – PPO

## 2023-11-14 ENCOUNTER — Other Ambulatory Visit: Payer: Self-pay

## 2023-11-14 ENCOUNTER — Other Ambulatory Visit (HOSPITAL_BASED_OUTPATIENT_CLINIC_OR_DEPARTMENT_OTHER): Payer: Self-pay

## 2023-11-14 ENCOUNTER — Inpatient Hospital Stay (HOSPITAL_BASED_OUTPATIENT_CLINIC_OR_DEPARTMENT_OTHER): Payer: BC Managed Care – PPO | Admitting: Nurse Practitioner

## 2023-11-14 VITALS — BP 125/93 | HR 89 | Temp 98.2°F | Resp 18

## 2023-11-14 DIAGNOSIS — C210 Malignant neoplasm of anus, unspecified: Secondary | ICD-10-CM

## 2023-11-14 DIAGNOSIS — Z95828 Presence of other vascular implants and grafts: Secondary | ICD-10-CM

## 2023-11-14 LAB — CBC WITH DIFFERENTIAL (CANCER CENTER ONLY)
Abs Immature Granulocytes: 0.06 10*3/uL (ref 0.00–0.07)
Basophils Absolute: 0 10*3/uL (ref 0.0–0.1)
Basophils Relative: 1 %
Eosinophils Absolute: 0.1 10*3/uL (ref 0.0–0.5)
Eosinophils Relative: 2 %
HCT: 40.7 % (ref 36.0–46.0)
Hemoglobin: 13.9 g/dL (ref 12.0–15.0)
Immature Granulocytes: 1 %
Lymphocytes Relative: 13 %
Lymphs Abs: 0.8 10*3/uL (ref 0.7–4.0)
MCH: 33.4 pg (ref 26.0–34.0)
MCHC: 34.2 g/dL (ref 30.0–36.0)
MCV: 97.8 fL (ref 80.0–100.0)
Monocytes Absolute: 0.6 10*3/uL (ref 0.1–1.0)
Monocytes Relative: 10 %
Neutro Abs: 4.5 10*3/uL (ref 1.7–7.7)
Neutrophils Relative %: 73 %
Platelet Count: 107 10*3/uL — ABNORMAL LOW (ref 150–400)
RBC: 4.16 MIL/uL (ref 3.87–5.11)
RDW: 12.7 % (ref 11.5–15.5)
WBC Count: 6 10*3/uL (ref 4.0–10.5)
nRBC: 0 % (ref 0.0–0.2)

## 2023-11-14 LAB — CMP (CANCER CENTER ONLY)
ALT: 100 U/L — ABNORMAL HIGH (ref 0–44)
AST: 59 U/L — ABNORMAL HIGH (ref 15–41)
Albumin: 4.4 g/dL (ref 3.5–5.0)
Alkaline Phosphatase: 111 U/L (ref 38–126)
Anion gap: 9 (ref 5–15)
BUN: 9 mg/dL (ref 6–20)
CO2: 27 mmol/L (ref 22–32)
Calcium: 10 mg/dL (ref 8.9–10.3)
Chloride: 101 mmol/L (ref 98–111)
Creatinine: 0.58 mg/dL (ref 0.44–1.00)
GFR, Estimated: >60 mL/min (ref >60)
Glucose, Bld: 258 mg/dL — ABNORMAL HIGH (ref 70–99)
Potassium: 4.1 mmol/L (ref 3.5–5.1)
Sodium: 137 mmol/L (ref 135–145)
Total Bilirubin: 0.6 mg/dL (ref <1.2)
Total Protein: 7.1 g/dL (ref 6.5–8.1)

## 2023-11-14 MED ORDER — SODIUM CHLORIDE 0.9 % IV SOLN
500.0000 mg | Freq: Once | INTRAVENOUS | Status: AC
  Administered 2023-11-14: 500 mg via INTRAVENOUS
  Filled 2023-11-14: qty 20

## 2023-11-14 MED ORDER — FAMOTIDINE IN NACL 20-0.9 MG/50ML-% IV SOLN
20.0000 mg | Freq: Once | INTRAVENOUS | Status: AC
  Administered 2023-11-14: 20 mg via INTRAVENOUS
  Filled 2023-11-14: qty 50

## 2023-11-14 MED ORDER — SODIUM CHLORIDE 0.9 % IV SOLN
384.0000 mg | Freq: Once | INTRAVENOUS | Status: AC
  Administered 2023-11-14: 380 mg via INTRAVENOUS
  Filled 2023-11-14: qty 38

## 2023-11-14 MED ORDER — DIPHENHYDRAMINE HCL 50 MG/ML IJ SOLN
25.0000 mg | Freq: Once | INTRAMUSCULAR | Status: AC
  Administered 2023-11-14: 25 mg via INTRAVENOUS
  Filled 2023-11-14: qty 1

## 2023-11-14 MED ORDER — OXYCODONE HCL 5 MG PO TABS
2.5000 mg | ORAL_TABLET | ORAL | 0 refills | Status: DC | PRN
  Filled 2023-11-14: qty 75, 13d supply, fill #0

## 2023-11-14 MED ORDER — SODIUM CHLORIDE 0.9 % IV SOLN
60.0000 mg/m2 | Freq: Once | INTRAVENOUS | Status: AC
  Administered 2023-11-14: 120 mg via INTRAVENOUS
  Filled 2023-11-14: qty 20

## 2023-11-14 MED ORDER — SODIUM CHLORIDE 0.9% FLUSH
10.0000 mL | INTRAVENOUS | Status: DC | PRN
  Administered 2023-11-14: 10 mL

## 2023-11-14 MED ORDER — DEXAMETHASONE SODIUM PHOSPHATE 10 MG/ML IJ SOLN
10.0000 mg | Freq: Once | INTRAMUSCULAR | Status: AC
  Administered 2023-11-14: 10 mg via INTRAVENOUS
  Filled 2023-11-14: qty 1

## 2023-11-14 MED ORDER — SODIUM CHLORIDE 0.9 % IV SOLN
150.0000 mg | Freq: Once | INTRAVENOUS | Status: AC
  Administered 2023-11-14: 150 mg via INTRAVENOUS
  Filled 2023-11-14: qty 150

## 2023-11-14 MED ORDER — HEPARIN SOD (PORK) LOCK FLUSH 100 UNIT/ML IV SOLN
500.0000 [IU] | Freq: Once | INTRAVENOUS | Status: AC | PRN
  Administered 2023-11-14: 500 [IU]

## 2023-11-14 MED ORDER — SODIUM CHLORIDE 0.9 % IV SOLN: INTRAVENOUS | Status: DC

## 2023-11-14 MED ORDER — PALONOSETRON HCL INJECTION 0.25 MG/5ML
0.2500 mg | Freq: Once | INTRAVENOUS | Status: AC
  Administered 2023-11-14: 0.25 mg via INTRAVENOUS
  Filled 2023-11-14: qty 5

## 2023-11-14 MED ORDER — SODIUM CHLORIDE 0.9% FLUSH
10.0000 mL | INTRAVENOUS | Status: DC | PRN
  Administered 2023-11-14: 10 mL via INTRAVENOUS

## 2023-11-14 NOTE — Patient Instructions (Signed)
[facility] - A DEPT OF [facility]  Discharge Instructions: Thank you for choosing [facility] to provide your oncology and hematology care.   If you have a lab appointment with the Cancer Center, please go directly to the Cancer Center and check in at the registration area.   Wear comfortable clothing and clothing appropriate for easy access to any Portacath or PICC line.   We strive to give you quality time with your provider. You may need to reschedule your appointment if you arrive late (15 or more minutes).  Arriving late affects you and other patients whose appointments are after yours.  Also, if you miss three or more appointments without notifying the office, you may be dismissed from the clinic at the provider's discretion.      For prescription refill requests, have your pharmacy contact our office and allow 72 hours for refills to be completed.    Today you received the following chemotherapy and/or immunotherapy agents Retifanlimb-dlwr Pearletha [place]), Taxol and Carboplatin      To help prevent nausea and vomiting after your treatment, we encourage you to take your nausea medication as directed.  BELOW ARE SYMPTOMS THAT SHOULD BE REPORTED IMMEDIATELY: *FEVER GREATER THAN 100.4 F (38 C) OR HIGHER *CHILLS OR SWEATING *NAUSEA AND VOMITING THAT IS NOT CONTROLLED WITH YOUR NAUSEA MEDICATION *UNUSUAL SHORTNESS OF BREATH *UNUSUAL BRUISING OR BLEEDING *URINARY PROBLEMS (pain or burning when urinating, or frequent urination) *BOWEL PROBLEMS (unusual diarrhea, constipation, pain near the anus) TENDERNESS IN MOUTH AND THROAT WITH OR WITHOUT PRESENCE OF ULCERS (sore throat, sores in mouth, or a toothache) UNUSUAL RASH, SWELLING OR PAIN  UNUSUAL VAGINAL DISCHARGE OR ITCHING   Items with * indicate a potential emergency and should be followed up as soon as possible or go to the Emergency Department if any problems should occur.  Please show the  CHEMOTHERAPY ALERT CARD or IMMUNOTHERAPY ALERT CARD at check-in to the Emergency Department and triage nurse.  Should you have questions after your visit or need to cancel or reschedule your appointment, please contact [facility] - A DEPT OF [facility]  Dept: [phone]  and follow the prompts.  Office hours are 8:00 a.m. to 4:30 p.m. Monday - Friday. Please note that voicemails left after 4:00 p.m. may not be returned until the following business day.  We are closed weekends and major holidays. You have access to a nurse at all times for urgent questions. Please call the main number to the clinic Dept: [phone] and follow the prompts.   For any non-urgent questions, you may also contact your provider using MyChart. We now offer e-Visits for anyone 26 and older to request care online for non-urgent symptoms. For details visit [email or web address].   Also download the MyChart app! Go to the app store, search "MyChart", open the app, select [place], and log in with your MyChart username and password.  Retifanlimab Injection What is this medication? RETIFANLIMAB (RE ti FAN li mab) treats skin cancer. It works by helping your immune system slow or stop the spread of cancer cells. It is a monoclonal antibody. This medicine may be used for other purposes; ask your health care provider or pharmacist if you have questions. COMMON BRAND NAME(S): ZYNYZ What should I tell my care team before I take this medication? They need to know if you have any of these conditions: Allogeneic stem cell transplant (uses someone else's stem cells) Autoimmune diseases,  such as Crohn's disease, ulcerative colitis, lupus History of chest radiation Nervous system problems, such as Guillain-Barre syndrome or myasthenia gravis Organ transplant An unusual or allergic reaction to retifanlimab, other medications, foods, dyes, or preservatives Pregnant or trying to get  pregnant Breast-feeding How should I use this medication? This medication is infused into a vein. It is given by your care team in a hospital or clinic setting. A special MedGuide will be given to you before each treatment. Be sure to read this information carefully each time. Talk to your care team about the use of this medication in children. Special care may be needed. Overdosage: If you think you have taken too much of this medicine contact a poison control center or emergency room at once. NOTE: This medicine is only for you. Do not share this medicine with others. What if I miss a dose? Keep appointments for follow-up doses. It is important not to miss your dose. Call your care team if you are unable to keep an appointment. What may interact with this medication? Interactions have not been studied. This list may not describe all possible interactions. Give your health care provider a list of all the medicines, herbs, non-prescription drugs, or dietary supplements you use. Also tell them if you smoke, drink alcohol, or use illegal drugs. Some items may interact with your medicine. What should I watch for while using this medication? Visit your care team for regular checks on your progress. It may be some time before you see the benefit from this medication. You may need blood work while taking this medication. This medication may cause serious skin reactions. They can happen weeks to months after starting the medication. Contact your care team right away if you notice fevers or flu-like symptoms with a rash. The rash may be red or purple and then turn into blisters or peeling of the skin. You may also notice a red rash with swelling of the face, lips, or lymph nodes in your neck or under your arms. Tell your care team right away if you have any change in your eyesight. Talk to your care team if you wish to become pregnant or think you might be pregnant. A negative pregnancy test is required  before starting this medication. A reliable form of contraception is recommended while taking this medication and for 4 months after stopping therapy. Talk to your care team about effective forms of contraception. Do not breast-feed while taking this medication and for 4 months after stopping therapy. What side effects may I notice from receiving this medication? Side effects that you should report to your care team as soon as possible: Allergic reactions--skin rash, itching, hives, swelling of the face, lips, tongue, or throat Dry cough, shortness of breath or trouble breathing Eye pain, redness, irritation, or discharge with blurry or decreased vision Heart muscle inflammation--unusual weakness or fatigue, shortness of breath, chest pain, fast or irregular heartbeat, dizziness, swelling of the ankles, feet, or hands Hormone gland problems--headache, sensitivity to light, unusual weakness or fatigue, dizziness, fast or irregular heartbeat, increased sensitivity to cold or heat, excessive sweating, constipation, hair loss, increased thirst or amount of urine, tremors or shaking, irritability Infusion reactions--chest pain, shortness of breath or trouble breathing, feeling faint or lightheaded Kidney injury (glomerulonephritis)--decrease in the amount of urine, red or dark brown urine, foamy or bubbly urine, swelling of the ankles, hands, or feet Liver injury--right upper belly pain, loss of appetite, nausea, light-colored stool, dark yellow or brown urine, yellowing skin  or eyes, unusual weakness or fatigue Pain, tingling, or numbness in the hands or feet, muscle weakness, change in vision, confusion or trouble speaking, loss of balance or coordination, trouble walking, seizures Rash, fever, and swollen lymph nodes Redness, blistering, peeling, or loosening of the skin, including inside the mouth Sudden or severe stomach pain, bloody diarrhea, fever, nausea, vomiting Side effects that usually do not  require medical attention (report these to your care team if they continue or are bothersome): Bone, joint, or muscle pain Diarrhea Fatigue Loss of appetite Nausea Skin rash This list may not describe all possible side effects. Call your doctor for medical advice about side effects. You may report side effects to FDA at 1-800-FDA-1088. Where should I keep my medication? This medication is given in a hospital or clinic. It will not be stored at home. NOTE: This sheet is a summary. It may not cover all possible information. If you have questions about this medicine, talk to your doctor, pharmacist, or health care provider.  2024 Elsevier/Gold Standard (2022-03-28 00:00:00)

## 2023-11-14 NOTE — Progress Notes (Signed)
[name] OFFICE PROGRESS NOTE   Diagnosis: Anal cancer  INTERVAL HISTORY:   Beth [name] returns as scheduled.  She had mild nausea after day 1 Taxol/carboplatin.  No issues after the day 8 treatment.  No mouth sores.  No diarrhea or constipation.  No numbness or tingling in the hands or feet.  No rash.  No hand or foot pain or redness.  She continues to have right abdominal pain.  She takes oxycodone as needed.  Objective:  Vital signs in last 24 hours:  Blood pressure (!) 132/94, pulse 98, temperature 97.9 F (36.6 C), temperature source Temporal, resp. rate 18, height 5\' 7"  (1.702 m), weight 176 lb (79.8 kg), last menstrual period 03/13/2012, SpO2 99%.    HEENT: No thrush or ulcers. Resp: Lungs clear bilaterally. Cardio: Regular rate and rhythm. GI: No hepatosplenomegaly.  Left lower quadrant colostomy.  Tender in the right upper abdomen, question nodule with deep palpation. Vascular: No leg edema. Skin: No rash. Port-A-Cath without erythema.  Lab Results:  Lab Results  Component Value Date   WBC 6.0 11/14/2023   HGB 13.9 11/14/2023   HCT 40.7 11/14/2023   MCV 97.8 11/14/2023   PLT 107 (L) 11/14/2023   NEUTROABS 4.5 11/14/2023    Imaging:  No results found.  Medications: I have reviewed the patient's current medications.  Assessment/Plan: Squamous cell carcinoma of the anal canal Colonoscopy 01/28/2021-rectal mass extending to the "outside", palpable on exam-biopsy revealed high-grade squamous intraepithelial lesion (AIN 3/carcinoma in situ) Exam under anesthesia with transanal excision of an anal canal mass 02/25/2021-frozen section consistent with squamous cell carcinoma, 4 x 4 centimeter sessile anal canal mass extending to the anal verge, right lateral and posterior, 30% circumferential, fixed to the anal sphincter complex; anal rectal mass with mixed high-grade neuroendocrine squamous cell carcinoma; posterior anal tag with squamous cell  carcinoma; left labial lesion-condyloma acuminata CTs 03/07/2021-ill-definition of tissue planes along the anus with abnormal soft tissue prominence posteriorly along the anal canal suspicious for mass.  Small adjacent lower perirectal lymph nodes in addition to a 0.5 cm sacral node.  Focal enhancing lesion in the right hepatic lobe 1.2 x 0.8 x 0.9 cm.  2.1 x 1.5 cm cystic lesion right ovary with thin enhancing rim. PET scan 03/16/2021-hypermetabolic mass within the anal canal.  No definite evidence of local or distant metastatic disease.  7 mm left submandibular lymph node with mild FDG uptake, likely reactive.  Focal site of FDG uptake within the inner quadrant of the right breast. Radiation 03/21/2021-04/27/2021 Cycle one 5-FU/mitomycin-C 03/21/2021 Cycle two 5-FU/Mitomycin-C 04/18/2021, 5-FU dose reduced secondary to mucositis Anoscopy by Dr. Michaell Cowing 07/20/2021-divet in the right posterior lateral aspect with some right anterior and right lateral folds consistent with radiation treatment.  No ulceration.  Not fully resolved but markedly shrunk down. Anorectal mass 09/30/2021 examination under anesthesia, biopsy-poorly differentiated carcinoma-basaloid squamous carcinoma and high-grade neuroendocrine carcinoma, margins positive APR/and vertical rectus abdominis flap 11/03/2021, invasive poorly differentiated carcinoma, negative resection margins, 0/10 nodes, treatment response score 2,ypT1,ypN0 CT abdomen/pelvis 12/03/2021-high-grade small bowel obstruction transitioning in the left lower abdomen.  Presacral rim-enhancing 4.4 x 2.8 x 3.8 cm fluid collection.  No air in the collection.  Prior study described a potential abnormality in the right lobe of the liver, not seen on current study.  Cystic lesion of the right ovary resolved. CT chest 12/07/2021-interval development of bilateral trace pleural effusions. CTs 12/04/2022-new 2.6 x 2.5 cm right lower lobe nodule PET 12/21/2022-FDG avid right lower lobe  nodule, no  other evidence of metastatic disease 12/29/2022 right lower lobectomy, lymph node dissection-poorly differentiated carcinoma consistent with metastasis, 4.8 x 2.9 x 2.6 cm, margins free, 7 lymph nodes negative for carcinoma; cytohistomorphology correlation supports the current tumor being a metastasis from the previously diagnosed anal carcinoma CT chest 04/17/2023-interval right lower lobectomy with resection of right lung mass, pleural thickening and small right pleural effusion, no new mass or lymph node enlargement CTs 07/20/2023-diminished postoperative right pleural effusion.  Unchanged postoperative findings status post abdominoperineal resection with left lower quadrant and colostomy.  No evidence of metastatic disease. CT abdomen/pelvis 08/31/2023-heterogenous soft tissue nodule in the right upper quadrant anterior abdominal wall, no evidence of metastatic disease CT/ultrasound guided biopsy of right abdominal wall mass 09/07/2023-metastatic neoplasm consistent with a metastasis from anal cancer, Foundation 1-HRD signature negative, MSS, tumor mutation burden 4, BRCA 1 alteration, PD-L1 TPS 10% Cycle 1 day 1 Taxol//carboplatin 10/19/2023, day 8 Taxol 10/26/2023, day 15 Taxol held 11/02/2023 due to neutropenia Cycle 2-day 1 Taxol/carboplatin plus Retifanlimab 11/14/2023, Taxol and carboplatin dose reduced due to previous neutropenia, mild thrombocytopenia   Remote history of an anal fissure repair Tobacco use Moderate alcohol use Eczema Right breast mass- PET scan 03/16/2021 with focal FDG activity in the inner right breast Mammogram and right breast ultrasound 03/28/2021- negative Bilateral breast MRI 03/31/2021- 7 mm mass in the upper inner right breast correlating with the PET findings, no other abnormality, no adenopathy MRI guided biopsy of right breast mass 04/11/2021- fibrocystic change with sclerosing adenosis, small focus of inflammation/fibrosis suggestive of resolving fat necrosis, no evidence  of malignancy--repeat bilateral breast MRI at a 22-month interval; bilateral breast MRI on 10/13/2021-no evidence of breast malignancy.  Annual screening mammography recommended.   7.  Admission with small bowel obstruction 12/03/2021 status post exploratory laparotomy and lysis of adhesions 8.  Right abdominal pain/tenderness-potentially related to the right abdominal wall mass noted on CT 08/31/2023 9.  Elevated liver enzymes-chronic, etiology unclear 10.  Hematuria-evaluated by urology 09/11/2023, cystoscopy with radiation and infection changes, no tumor seen      Disposition: Ms. Divelbiss appears stable.  She has completed 1 cycle of Taxol/carboplatin.  The day 15 treatment was held due to neutropenia.  Plan to proceed with cycle 2 Taxol/carboplatin plus retifanlimab today as scheduled.  Taxol and carboplatin dose reduced due to previous neutropenia and mild thrombocytopenia.  We again reviewed potential toxicities associated with retifanlimab.  She agrees to proceed.  She understands to contact the office with fever, chills, other signs of infection, bleeding.  CBC and chemistry panel reviewed.  Labs adequate to proceed as above.  AST/ALT mildly elevated, improved overall.  Continue to monitor.  She will return for follow-up and cycle 2-day 8 on 11/22/2023.  We are available to see her sooner if needed.  Plan reviewed with Dr. Truett Perna.    Beth [name] ANP/GNP-BC   11/14/2023  9:53 AM

## 2023-11-14 NOTE — Progress Notes (Signed)
Patient seen by [name] NP today  Vitals are within treatment parameters:No (Please specify and give further instructions.)B/P 130/94   Labs are within treatment parameters: No (Please specify and give further instructions.) ALT 100  Treatment plan has been signed: Yes   Per physician team, Patient is ready for treatment. Please note the following modifications: dose reduced of the taxol and carboplatin

## 2023-11-14 NOTE — Patient Instructions (Signed)

## 2023-11-16 ENCOUNTER — Other Ambulatory Visit: Payer: Self-pay | Admitting: Oncology

## 2023-11-22 ENCOUNTER — Encounter: Payer: Self-pay | Admitting: Nurse Practitioner

## 2023-11-22 ENCOUNTER — Inpatient Hospital Stay: Payer: BC Managed Care – PPO

## 2023-11-22 ENCOUNTER — Inpatient Hospital Stay: Payer: BC Managed Care – PPO | Attending: Oncology

## 2023-11-22 ENCOUNTER — Inpatient Hospital Stay: Payer: BC Managed Care – PPO | Admitting: Nurse Practitioner

## 2023-11-22 VITALS — BP 117/89 | HR 86 | Resp 18

## 2023-11-22 VITALS — BP 132/89 | HR 100 | Temp 97.8°F | Resp 18 | Ht 67.0 in | Wt 175.1 lb

## 2023-11-22 DIAGNOSIS — C210 Malignant neoplasm of anus, unspecified: Secondary | ICD-10-CM | POA: Insufficient documentation

## 2023-11-22 DIAGNOSIS — Z5112 Encounter for antineoplastic immunotherapy: Secondary | ICD-10-CM | POA: Insufficient documentation

## 2023-11-22 DIAGNOSIS — Z5111 Encounter for antineoplastic chemotherapy: Secondary | ICD-10-CM | POA: Insufficient documentation

## 2023-11-22 DIAGNOSIS — Z79899 Other long term (current) drug therapy: Secondary | ICD-10-CM | POA: Diagnosis not present

## 2023-11-22 LAB — CBC WITH DIFFERENTIAL (CANCER CENTER ONLY)
Abs Immature Granulocytes: 0.04 10*3/uL (ref 0.00–0.07)
Basophils Absolute: 0 10*3/uL (ref 0.0–0.1)
Basophils Relative: 1 %
Eosinophils Absolute: 0.1 10*3/uL (ref 0.0–0.5)
Eosinophils Relative: 3 %
HCT: 36.6 % (ref 36.0–46.0)
Hemoglobin: 12.5 g/dL (ref 12.0–15.0)
Immature Granulocytes: 1 %
Lymphocytes Relative: 11 %
Lymphs Abs: 0.5 10*3/uL — ABNORMAL LOW (ref 0.7–4.0)
MCH: 33.6 pg (ref 26.0–34.0)
MCHC: 34.2 g/dL (ref 30.0–36.0)
MCV: 98.4 fL (ref 80.0–100.0)
Monocytes Absolute: 0.3 10*3/uL (ref 0.1–1.0)
Monocytes Relative: 7 %
Neutro Abs: 3.6 10*3/uL (ref 1.7–7.7)
Neutrophils Relative %: 77 %
Platelet Count: 148 10*3/uL — ABNORMAL LOW (ref 150–400)
RBC: 3.72 MIL/uL — ABNORMAL LOW (ref 3.87–5.11)
RDW: 12.8 % (ref 11.5–15.5)
WBC Count: 4.7 10*3/uL (ref 4.0–10.5)
nRBC: 0 % (ref 0.0–0.2)

## 2023-11-22 LAB — CMP (CANCER CENTER ONLY)
ALT: 172 U/L — ABNORMAL HIGH (ref 0–44)
AST: 74 U/L — ABNORMAL HIGH (ref 15–41)
Albumin: 4.2 g/dL (ref 3.5–5.0)
Alkaline Phosphatase: 94 U/L (ref 38–126)
Anion gap: 9 (ref 5–15)
BUN: 10 mg/dL (ref 6–20)
CO2: 25 mmol/L (ref 22–32)
Calcium: 9.5 mg/dL (ref 8.9–10.3)
Chloride: 103 mmol/L (ref 98–111)
Creatinine: 0.71 mg/dL (ref 0.44–1.00)
GFR, Estimated: >60 mL/min (ref >60)
Glucose, Bld: 255 mg/dL — ABNORMAL HIGH (ref 70–99)
Potassium: 3.8 mmol/L (ref 3.5–5.1)
Sodium: 137 mmol/L (ref 135–145)
Total Bilirubin: 0.6 mg/dL (ref <1.2)
Total Protein: 6.7 g/dL (ref 6.5–8.1)

## 2023-11-22 MED ORDER — DEXAMETHASONE SODIUM PHOSPHATE 10 MG/ML IJ SOLN
10.0000 mg | Freq: Once | INTRAMUSCULAR | Status: AC
  Administered 2023-11-22: 10 mg via INTRAVENOUS
  Filled 2023-11-22: qty 1

## 2023-11-22 MED ORDER — SODIUM CHLORIDE 0.9 % IV SOLN: INTRAVENOUS | Status: DC

## 2023-11-22 MED ORDER — SODIUM CHLORIDE 0.9% FLUSH
10.0000 mL | INTRAVENOUS | Status: DC | PRN
  Administered 2023-11-22: 10 mL

## 2023-11-22 MED ORDER — SODIUM CHLORIDE 0.9 % IV SOLN
60.0000 mg/m2 | Freq: Once | INTRAVENOUS | Status: AC
  Administered 2023-11-22: 120 mg via INTRAVENOUS
  Filled 2023-11-22: qty 20

## 2023-11-22 MED ORDER — DIPHENHYDRAMINE HCL 50 MG/ML IJ SOLN
25.0000 mg | Freq: Once | INTRAMUSCULAR | Status: AC
  Administered 2023-11-22: 25 mg via INTRAVENOUS
  Filled 2023-11-22: qty 1

## 2023-11-22 MED ORDER — HEPARIN SOD (PORK) LOCK FLUSH 100 UNIT/ML IV SOLN
500.0000 [IU] | Freq: Once | INTRAVENOUS | Status: AC | PRN
  Administered 2023-11-22: 500 [IU]

## 2023-11-22 MED ORDER — FAMOTIDINE IN NACL 20-0.9 MG/50ML-% IV SOLN
20.0000 mg | Freq: Once | INTRAVENOUS | Status: AC
  Administered 2023-11-22: 20 mg via INTRAVENOUS
  Filled 2023-11-22: qty 50

## 2023-11-22 NOTE — Progress Notes (Signed)
Patient seen by [name] NP today  Vitals are within treatment parameters:Yes   Labs are within treatment parameters: No (Please specify and give further instructions.) ALT 172  Treatment plan has been signed: Yes   Per physician team, Patient is ready for treatment and there are NO modifications to the treatment plan.

## 2023-11-22 NOTE — Patient Instructions (Signed)
[facility] - A DEPT OF [facility]   Discharge Instructions: Thank you for choosing [facility] to provide your oncology and hematology care.   If you have a lab appointment with the Cancer Center, please go directly to the Cancer Center and check in at the registration area.   Wear comfortable clothing and clothing appropriate for easy access to any Portacath or PICC line.   We strive to give you quality time with your provider. You may need to reschedule your appointment if you arrive late (15 or more minutes).  Arriving late affects you and other patients whose appointments are after yours.  Also, if you miss three or more appointments without notifying the office, you may be dismissed from the clinic at the provider's discretion.      For prescription refill requests, have your pharmacy contact our office and allow 72 hours for refills to be completed.    Today you received the following chemotherapy and/or immunotherapy agents TAXOL      To help prevent nausea and vomiting after your treatment, we encourage you to take your nausea medication as directed.  BELOW ARE SYMPTOMS THAT SHOULD BE REPORTED IMMEDIATELY: *FEVER GREATER THAN 100.4 F (38 C) OR HIGHER *CHILLS OR SWEATING *NAUSEA AND VOMITING THAT IS NOT CONTROLLED WITH YOUR NAUSEA MEDICATION *UNUSUAL SHORTNESS OF BREATH *UNUSUAL BRUISING OR BLEEDING *URINARY PROBLEMS (pain or burning when urinating, or frequent urination) *BOWEL PROBLEMS (unusual diarrhea, constipation, pain near the anus) TENDERNESS IN MOUTH AND THROAT WITH OR WITHOUT PRESENCE OF ULCERS (sore throat, sores in mouth, or a toothache) UNUSUAL RASH, SWELLING OR PAIN  UNUSUAL VAGINAL DISCHARGE OR ITCHING   Items with * indicate a potential emergency and should be followed up as soon as possible or go to the Emergency Department if any problems should occur.  Please show the CHEMOTHERAPY ALERT CARD or IMMUNOTHERAPY  ALERT CARD at check-in to the Emergency Department and triage nurse.  Should you have questions after your visit or need to cancel or reschedule your appointment, please contact [facility]  Dept: [phone]  and follow the prompts.  Office hours are 8:00 a.m. to 4:30 p.m. Monday - Friday. Please note that voicemails left after 4:00 p.m. may not be returned until the following business day.  We are closed weekends and major holidays. You have access to a nurse at all times for urgent questions. Please call the main number to the clinic Dept: [phone] and follow the prompts.   For any non-urgent questions, you may also contact your provider using MyChart. We now offer e-Visits for anyone 22 and older to request care online for non-urgent symptoms. For details visit [email or web address].   Also download the MyChart app! Go to the app store, search "MyChart", open the app, select [facility], and log in with your MyChart username and password.  Paclitaxel Injection What is this medication? PACLITAXEL (PAK li TAX el) treats some types of cancer. It works by slowing down the growth of cancer cells. This medicine may be used for other purposes; ask your health care provider or pharmacist if you have questions. COMMON BRAND NAME(S): Onxol, Taxol What should I tell my care team before I take this medication? They need to know if you have any of these conditions: Heart disease Liver disease Low white blood cell levels An unusual or allergic reaction to paclitaxel, other medications, foods, dyes, or preservatives  If you or your partner are pregnant or trying to get pregnant Breast-feeding How should I use this medication? This medication is injected into a vein. It is given by your care team in a hospital or clinic setting. Talk to your care team about the use of this medication in children. While it may be given to children for selected  conditions, precautions do apply. Overdosage: If you think you have taken too much of this medicine contact a poison control center or emergency room at once. NOTE: This medicine is only for you. Do not share this medicine with others. What if I miss a dose? Keep appointments for follow-up doses. It is important not to miss your dose. Call your care team if you are unable to keep an appointment. What may interact with this medication? Do not take this medication with any of the following: Live virus vaccines Other medications may affect the way this medication works. Talk with your care team about all of the medications you take. They may suggest changes to your treatment plan to lower the risk of side effects and to make sure your medications work as intended. This list may not describe all possible interactions. Give your health care provider a list of all the medicines, herbs, non-prescription drugs, or dietary supplements you use. Also tell them if you smoke, drink alcohol, or use illegal drugs. Some items may interact with your medicine. What should I watch for while using this medication? Your condition will be monitored carefully while you are receiving this medication. You may need blood work while taking this medication. This medication may make you feel generally unwell. This is not uncommon as chemotherapy can affect healthy cells as well as cancer cells. Report any side effects. Continue your course of treatment even though you feel ill unless your care team tells you to stop. This medication can cause serious allergic reactions. To reduce the risk, your care team may give you other medications to take before receiving this one. Be sure to follow the directions from your care team. This medication may increase your risk of getting an infection. Call your care team for advice if you get a fever, chills, sore throat, or other symptoms of a cold or flu. Do not treat yourself. Try to avoid  being around people who are sick. This medication may increase your risk to bruise or bleed. Call your care team if you notice any unusual bleeding. Be careful brushing or flossing your teeth or using a toothpick because you may get an infection or bleed more easily. If you have any dental work done, tell your dentist you are receiving this medication. Talk to your care team if you may be pregnant. Serious birth defects can occur if you take this medication during pregnancy. Talk to your care team before breastfeeding. Changes to your treatment plan may be needed. What side effects may I notice from receiving this medication? Side effects that you should report to your care team as soon as possible: Allergic reactions--skin rash, itching, hives, swelling of the face, lips, tongue, or throat Heart rhythm changes--fast or irregular heartbeat, dizziness, feeling faint or lightheaded, chest pain, trouble breathing Increase in blood pressure Infection--fever, chills, cough, sore throat, wounds that don't heal, pain or trouble when passing urine, general feeling of discomfort or being unwell Low blood pressure--dizziness, feeling faint or lightheaded, blurry vision Low red blood cell level--unusual weakness or fatigue, dizziness, headache, trouble breathing Painful swelling, warmth, or redness of the skin, blisters  or sores at the infusion site Pain, tingling, or numbness in the hands or feet Slow heartbeat--dizziness, feeling faint or lightheaded, confusion, trouble breathing, unusual weakness or fatigue Unusual bruising or bleeding Side effects that usually do not require medical attention (report to your care team if they continue or are bothersome): Diarrhea Hair loss Joint pain Loss of appetite Muscle pain Nausea Vomiting This list may not describe all possible side effects. Call your doctor for medical advice about side effects. You may report side effects to FDA at 1-800-FDA-1088. Where  should I keep my medication? This medication is given in a hospital or clinic. It will not be stored at home. NOTE: This sheet is a summary. It may not cover all possible information. If you have questions about this medicine, talk to your doctor, pharmacist, or health care provider.  2024 Elsevier/Gold Standard (2022-04-25 00:00:00)

## 2023-11-22 NOTE — Progress Notes (Signed)
[facility] OFFICE PROGRESS NOTE   Diagnosis: Anal cancer  INTERVAL HISTORY:   Ms. Nolte returns as scheduled.  She completed cycle 2-day 1 Taxol/carboplatin plus retifanlimab 11/14/2023.  She had a few episodes of nausea, no vomiting.  The nausea was relieved with her home medications.  No mouth sores.  No diarrhea.  She noted facial and neck redness the day following treatment, resolved spontaneously.  No other skin change.  No arthralgias.  No numbness or tingling in the hands or feet.  She continues to have intermittent pain at the right upper abdomen.  Objective:  Vital signs in last 24 hours:  Blood pressure 132/89, pulse 100, temperature 97.8 F (36.6 C), temperature source Temporal, resp. rate 18, height 5\' 7"  (1.702 m), weight 175 lb 1.6 oz (79.4 kg), last menstrual period 03/13/2012, SpO2 100%.    HEENT: No thrush or ulcers. Resp: Lungs clear bilaterally. Cardio: Regular rate and rhythm. GI: Abdomen is soft.  Mild tenderness at the right upper abdomen.  No hepatomegaly.  No mass. Vascular: No leg edema. Skin: No rash. Port-A-Cath without erythema.  Lab Results:  Lab Results  Component Value Date   WBC 4.7 11/22/2023   HGB 12.5 11/22/2023   HCT 36.6 11/22/2023   MCV 98.4 11/22/2023   PLT 148 (L) 11/22/2023   NEUTROABS 3.6 11/22/2023    Imaging:  No results found.  Medications: I have reviewed the patient's current medications.  Assessment/Plan: Squamous cell carcinoma of the anal canal Colonoscopy 01/28/2021-rectal mass extending to the "outside", palpable on exam-biopsy revealed high-grade squamous intraepithelial lesion (AIN 3/carcinoma in situ) Exam under anesthesia with transanal excision of an anal canal mass 02/25/2021-frozen section consistent with squamous cell carcinoma, 4 x 4 centimeter sessile anal canal mass extending to the anal verge, right lateral and posterior, 30% circumferential, fixed to the anal sphincter complex; anal rectal  mass with mixed high-grade neuroendocrine squamous cell carcinoma; posterior anal tag with squamous cell carcinoma; left labial lesion-condyloma acuminata CTs 03/07/2021-ill-definition of tissue planes along the anus with abnormal soft tissue prominence posteriorly along the anal canal suspicious for mass.  Small adjacent lower perirectal lymph nodes in addition to a 0.5 cm sacral node.  Focal enhancing lesion in the right hepatic lobe 1.2 x 0.8 x 0.9 cm.  2.1 x 1.5 cm cystic lesion right ovary with thin enhancing rim. PET scan 03/16/2021-hypermetabolic mass within the anal canal.  No definite evidence of local or distant metastatic disease.  7 mm left submandibular lymph node with mild FDG uptake, likely reactive.  Focal site of FDG uptake within the inner quadrant of the right breast. Radiation 03/21/2021-04/27/2021 Cycle one 5-FU/mitomycin-C 03/21/2021 Cycle two 5-FU/Mitomycin-C 04/18/2021, 5-FU dose reduced secondary to mucositis Anoscopy by Dr. Michaell Cowing 07/20/2021-divet in the right posterior lateral aspect with some right anterior and right lateral folds consistent with radiation treatment.  No ulceration.  Not fully resolved but markedly shrunk down. Anorectal mass 09/30/2021 examination under anesthesia, biopsy-poorly differentiated carcinoma-basaloid squamous carcinoma and high-grade neuroendocrine carcinoma, margins positive APR/and vertical rectus abdominis flap 11/03/2021, invasive poorly differentiated carcinoma, negative resection margins, 0/10 nodes, treatment response score 2,ypT1,ypN0 CT abdomen/pelvis 12/03/2021-high-grade small bowel obstruction transitioning in the left lower abdomen.  Presacral rim-enhancing 4.4 x 2.8 x 3.8 cm fluid collection.  No air in the collection.  Prior study described a potential abnormality in the right lobe of the liver, not seen on current study.  Cystic lesion of the right ovary resolved. CT chest 12/07/2021-interval development of bilateral trace pleural  effusions. CTs 12/04/2022-new 2.6 x 2.5 cm right lower lobe nodule PET 12/21/2022-FDG avid right lower lobe nodule, no other evidence of metastatic disease 12/29/2022 right lower lobectomy, lymph node dissection-poorly differentiated carcinoma consistent with metastasis, 4.8 x 2.9 x 2.6 cm, margins free, 7 lymph nodes negative for carcinoma; cytohistomorphology correlation supports the current tumor being a metastasis from the previously diagnosed anal carcinoma CT chest 04/17/2023-interval right lower lobectomy with resection of right lung mass, pleural thickening and small right pleural effusion, no new mass or lymph node enlargement CTs 07/20/2023-diminished postoperative right pleural effusion.  Unchanged postoperative findings status post abdominoperineal resection with left lower quadrant and colostomy.  No evidence of metastatic disease. CT abdomen/pelvis 08/31/2023-heterogenous soft tissue nodule in the right upper quadrant anterior abdominal wall, no evidence of metastatic disease CT/ultrasound guided biopsy of right abdominal wall mass 09/07/2023-metastatic neoplasm consistent with a metastasis from anal cancer, Foundation 1-HRD signature negative, MSS, tumor mutation burden 4, BRCA 1 alteration, PD-L1 TPS 10% Cycle 1 day 1 Taxol//carboplatin 10/19/2023, day 8 Taxol 10/26/2023, day 15 Taxol held 11/02/2023 due to neutropenia Cycle 2-day 1 Taxol/carboplatin plus Retifanlimab 11/14/2023, Taxol and carboplatin dose reduced due to previous neutropenia, mild thrombocytopenia; day 8 Taxol 11/22/2023   Remote history of an anal fissure repair Tobacco use Moderate alcohol use Eczema Right breast mass- PET scan 03/16/2021 with focal FDG activity in the inner right breast Mammogram and right breast ultrasound 03/28/2021- negative Bilateral breast MRI 03/31/2021- 7 mm mass in the upper inner right breast correlating with the PET findings, no other abnormality, no adenopathy MRI guided biopsy of right breast mass  04/11/2021- fibrocystic change with sclerosing adenosis, small focus of inflammation/fibrosis suggestive of resolving fat necrosis, no evidence of malignancy--repeat bilateral breast MRI at a 68-month interval; bilateral breast MRI on 10/13/2021-no evidence of breast malignancy.  Annual screening mammography recommended.   7.  Admission with small bowel obstruction 12/03/2021 status post exploratory laparotomy and lysis of adhesions 8.  Right abdominal pain/tenderness-potentially related to the right abdominal wall mass noted on CT 08/31/2023 9.  Elevated liver enzymes-chronic, etiology unclear 10.  Hematuria-evaluated by urology 09/11/2023, cystoscopy with radiation and infection changes, no tumor seen    Disposition: Ms. Bayless appears stable.  She completed cycle 2-day 1 Taxol/carboplatin plus Retifanlimab 11/14/2023.  She is tolerating treatment well.  Plan to proceed with cycle 2-day 8 Taxol today as scheduled.  CBC and chemistry panel reviewed.  Labs adequate to proceed as above.  AST/ALT elevated, unchanged.  She will return for follow-up and treatment in 1 week.  We are available to see her sooner if needed.    [name] ANP/GNP-BC   11/22/2023  11:09 AM

## 2023-11-23 ENCOUNTER — Other Ambulatory Visit: Payer: Self-pay

## 2023-11-26 ENCOUNTER — Ambulatory Visit: Payer: Self-pay | Admitting: Genetic Counselor

## 2023-11-26 ENCOUNTER — Encounter: Payer: Self-pay | Admitting: Genetic Counselor

## 2023-11-26 ENCOUNTER — Telehealth: Payer: Self-pay | Admitting: Genetic Counselor

## 2023-11-26 DIAGNOSIS — Z1379 Encounter for other screening for genetic and chromosomal anomalies: Secondary | ICD-10-CM | POA: Insufficient documentation

## 2023-11-26 DIAGNOSIS — C210 Malignant neoplasm of anus, unspecified: Secondary | ICD-10-CM

## 2023-11-26 DIAGNOSIS — Z803 Family history of malignant neoplasm of breast: Secondary | ICD-10-CM

## 2023-11-26 DIAGNOSIS — Z8042 Family history of malignant neoplasm of prostate: Secondary | ICD-10-CM

## 2023-11-26 NOTE — Progress Notes (Signed)
HPI:   Beth [name] was previously seen in the [facility] due to a personal history of anal cancer with a potential germline finding in the BRCA1 gene.     Beth [name] recent genetic test results were disclosed to her by telephone. These results and recommendations are discussed in more detail below.  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  Report date is 11/20/2023.   The [facility] CancerNext+RNAinsight Panel includes sequencing, rearrangement analysis, and RNA analysis for the following 39 genes: APC, ATM, BAP1, BARD1, BMPR1A, BRCA1, BRCA2, BRIP1, CDH1, CDKN2A, CHEK2, FH, FLCN, MET, MLH1, MSH2, MSH6, MUTYH, NF1, NTHL1, PALB2, PMS2, PTEN, RAD51C, RAD51D, SMAD4, STK11, TP53, TSC1, TSC2, and VHL (sequencing and deletion/duplication); AXIN2, HOXB13, MBD4, MSH3, POLD1 and POLE (sequencing only); EPCAM and GREM1 (deletion/duplication only).   Anal squamous cell carcinoma (HCC)  03/01/2021 Initial Diagnosis   Anal squamous cell carcinoma (HCC)   03/21/2021 - 04/22/2021 Chemotherapy   Patient is on Treatment Plan : ANUS Mitomycin D1,28 / 5FU D1-4, 28-31 q32d     10/19/2023 -  Chemotherapy   Patient is on Treatment Plan : ANUS Carboplatin + Paclitaxel q28d x 6 cycles     11/20/2023 Genetic Testing   Negative [facility] CancerNext+RNAinsight Panel.  Report date is 11/20/2023.   The [facility] CancerNext+RNAinsight Panel includes sequencing, rearrangement analysis, and RNA analysis for the following 39 genes: APC, ATM, BAP1, BARD1, BMPR1A, BRCA1, BRCA2, BRIP1, CDH1, CDKN2A, CHEK2, FH, FLCN, MET, MLH1, MSH2, MSH6, MUTYH, NF1, NTHL1, PALB2,  PMS2, PTEN, RAD51C, RAD51D, SMAD4, STK11, TP53, TSC1, TSC2, and VHL (sequencing and deletion/duplication); AXIN2, HOXB13, MBD4, MSH3, POLD1 and POLE (sequencing only); EPCAM and GREM1 (deletion/duplication only).     FAMILY HISTORY:  We obtained a detailed, 4-generation family history.  Significant diagnoses are listed below: Family History  Problem Relation Age of Onset   Breast cancer Paternal Aunt 1       neg GT   Prostate cancer Paternal Uncle        dx unkown age   Skin cancer Maternal Grandfather        face; dx >50   Cancer Other        breast ca in PGM's sister; metastatic prostate cancer in PGM's brother; lung cancer in PGM's sister   Breast cancer Cousin        paternal female cousin; dx before age 27   Cancer Paternal Great-grandfather        liver; PGM's father   Cancer Paternal Great-grandmother        unk GYN; PGM's mother    Beth [name] paternal aunt, who had a history of breast cancer at age 62, had negative genetic testing through [place] CancerNext+RNAinsight Panel in January 2024.  The [facility] CancerNext+RNAinsight Panel includes sequencing, rearrangement analysis, and RNA analysis for the following 36 genes: APC, ATM, BARD1, BMPR1A, BRCA1, BRCA2, BRIP1, CDH1, CDK4, CDKN2A, CHEK2, DICER1, MLH1, MSH2, MSH6, MUTYH, NBN, NF1, NTHL1, PALB2, PMS2, PTEN, RAD51C, RAD51D, RECQL, SMAD4, SMARCA4, STK11 and TP53 (sequencing and deletion/duplication); AXIN2, HOXB13, MSH3, POLD1 and POLE (sequencing only); EPCAM and GREM1 (deletion/duplication only). RNA data is routinely analyzed for use in  variant interpretation for all genes.   She is unaware of any other relatives that hereditary cancer genetic testing.  Other relatives are unavailable for genetic testing at this time.    There is no reported Ashkenazi Jewish ancestry. There is no known consanguinity.    GENETIC TEST RESULTS:  The [facility] CancerNext +RNAinsight Panel found no pathogenic mutations.   The [facility]  CancerNext+RNAinsight Panel includes sequencing, rearrangement analysis, and RNA analysis for the following 39 genes: APC, ATM, BAP1, BARD1, BMPR1A, BRCA1, BRCA2, BRIP1, CDH1, CDKN2A, CHEK2, FH, FLCN, MET, MLH1, MSH2, MSH6, MUTYH, NF1, NTHL1, PALB2, PMS2, PTEN, RAD51C, RAD51D, SMAD4, STK11, TP53, TSC1, TSC2, and VHL (sequencing and deletion/duplication); AXIN2, HOXB13, MBD4, MSH3, POLD1 and POLE (sequencing only); EPCAM and GREM1 (deletion/duplication only). .   The test report has been scanned into EPIC and is located under the Molecular Pathology section of the Results Review tab.  A portion of the result report is included below for reference. Genetic testing reported out on November 20, 2023.      The BRCA1 mutation c.2663del (p.[id number]*5) that was previously detected in her tumor was NOT detected in her germline sample, suggesting that this BRCA1 gene mutation was somatic in origin.  BRCA1 mutations that are somatic in origin do not impact cancer risks for family members.    Even though a pathogenic variant was not identified, possible explanations for the cancer in the family may include: There may be no hereditary risk for cancer in the family. The cancers in Ms. Pressman and/or her family may be sporadic/familial or due to other genetic and environmental factors.  Most cancer is not hereditary.  There may be a gene mutation in one of these genes that current testing methods cannot detect but that chance is small. There could be another gene that has not yet been discovered, or that we have not yet tested, that is responsible for the cancer diagnoses in the family.  It is also possible there is a hereditary cause for the cancer in the family that Ms. Wyka did not inherit.   Therefore, it is important to remain in touch with cancer genetics in the future so that we can continue to offer Ms. Schelling the most up to date genetic testing.    ADDITIONAL GENETIC TESTING:   Ms. Vaid genetic  testing was fairly extensive.  If there are additional relevant genes identified to increase cancer risk that can be analyzed in the future, we would be happy to discuss and coordinate this testing at that time.     CANCER SCREENING RECOMMENDATIONS:  Ms. Prioleau's test result is considered negative (normal).  This means that we have not identified a hereditary cause for her personal and family history of cancer at this time.   An individual's cancer risk and medical management are not determined by genetic test results alone. Overall cancer risk assessment incorporates additional factors, including personal medical history, family history, and any available genetic information that may result in a personalized plan for cancer prevention and surveillance. Therefore, it is recommended she continue to follow the cancer management and screening guidelines provided by her oncology and primary healthcare provider.  Her lifetime risk for breast cancer based on Tyrer-Cuzick risk model and reported personal/family history is 13.3%.  This is slightly elevated compared to the general population but not in the 'high risk for breast cancer' category per NCCN guidelines (>20%).  This risk estimate can change over time and may be repeated to reflect new information in her  personal or family history in the future.  She should remain diligent about annual breast imaging as directed by her providers.        RECOMMENDATIONS FOR FAMILY MEMBERS:   Since she did not inherit a identifiable mutation in a cancer predisposition gene included on this panel, her children could not have inherited a known mutation from her in one of these genes. Individuals in this family might be at some increased risk of developing cancer, over the general population risk, due to the family history of cancer.  Individuals in the family should notify their providers of the family history of cancer. We recommend women in this family have a yearly  mammogram beginning at age 61, or 56 years younger than the earliest onset of cancer, an annual clinical breast exam, and perform monthly breast self-exams.  Risk models that take into account family history and hormonal history may be helpful in determining appropriate breast cancer screening options for family members. Female relatives should speak with their providers about prostate cancer screening.  Other members of the family may still carry a pathogenic variant in one of these genes that Ms. Lourenco did not inherit. Based on the family history, we recommend first degree relatives of her paternal cousin who passed away from breast cancer before age 73 as well as her paternal uncle with prostate cancer have genetic counseling and testing. Ms. Dingmann can let us know if we can be of any assistance in coordinating genetic counseling and/or testing for these family member.     FOLLOW-UP:  Cancer genetics is a rapidly advancing field and it is possible that new genetic tests will be appropriate for her and/or her family members in the future. We encourage Ms. Kooy to remain in contact with cancer genetics, so we can update her personal and family histories and let her know of advances in cancer genetics that may benefit this family.   Our contact number was provided.  They are welcome to call us at anytime with additional questions or concerns.   Carole Deere M. Rennie Plowman, MS, [facility] Genetic Counselor [email or web address] (P) [phone]

## 2023-11-26 NOTE — Telephone Encounter (Signed)
Disclosed negative CancerNext +RNAinsight Panel.  BRCA1 mutation that was detected in her tumor was not detected in germline testing.  Discussed genetic testing for children is not indicated at this time based on her history.

## 2023-11-27 ENCOUNTER — Other Ambulatory Visit: Payer: Self-pay

## 2023-11-29 ENCOUNTER — Inpatient Hospital Stay: Payer: BC Managed Care – PPO

## 2023-11-29 ENCOUNTER — Inpatient Hospital Stay: Payer: BC Managed Care – PPO | Admitting: Oncology

## 2023-11-29 VITALS — BP 127/80 | HR 88 | Resp 18

## 2023-11-29 VITALS — BP 119/79 | HR 97 | Temp 98.1°F | Resp 18 | Ht 67.0 in | Wt 175.2 lb

## 2023-11-29 DIAGNOSIS — C210 Malignant neoplasm of anus, unspecified: Secondary | ICD-10-CM

## 2023-11-29 LAB — CBC WITH DIFFERENTIAL (CANCER CENTER ONLY)
Abs Immature Granulocytes: 0.03 10*3/uL (ref 0.00–0.07)
Basophils Absolute: 0 10*3/uL (ref 0.0–0.1)
Basophils Relative: 1 %
Eosinophils Absolute: 0.1 10*3/uL (ref 0.0–0.5)
Eosinophils Relative: 3 %
HCT: 35.4 % — ABNORMAL LOW (ref 36.0–46.0)
Hemoglobin: 12.3 g/dL (ref 12.0–15.0)
Immature Granulocytes: 1 %
Lymphocytes Relative: 20 %
Lymphs Abs: 0.4 10*3/uL — ABNORMAL LOW (ref 0.7–4.0)
MCH: 34.2 pg — ABNORMAL HIGH (ref 26.0–34.0)
MCHC: 34.7 g/dL (ref 30.0–36.0)
MCV: 98.3 fL (ref 80.0–100.0)
Monocytes Absolute: 0.2 10*3/uL (ref 0.1–1.0)
Monocytes Relative: 10 %
Neutro Abs: 1.4 10*3/uL — ABNORMAL LOW (ref 1.7–7.7)
Neutrophils Relative %: 65 %
Platelet Count: 136 10*3/uL — ABNORMAL LOW (ref 150–400)
RBC: 3.6 MIL/uL — ABNORMAL LOW (ref 3.87–5.11)
RDW: 13.1 % (ref 11.5–15.5)
WBC Count: 2.2 10*3/uL — ABNORMAL LOW (ref 4.0–10.5)
nRBC: 0 % (ref 0.0–0.2)

## 2023-11-29 LAB — CMP (CANCER CENTER ONLY)
ALT: 123 U/L — ABNORMAL HIGH (ref 0–44)
AST: 49 U/L — ABNORMAL HIGH (ref 15–41)
Albumin: 4.1 g/dL (ref 3.5–5.0)
Alkaline Phosphatase: 97 U/L (ref 38–126)
Anion gap: 8 (ref 5–15)
BUN: 9 mg/dL (ref 6–20)
CO2: 24 mmol/L (ref 22–32)
Calcium: 9.2 mg/dL (ref 8.9–10.3)
Chloride: 102 mmol/L (ref 98–111)
Creatinine: 0.69 mg/dL (ref 0.44–1.00)
GFR, Estimated: >60 mL/min (ref >60)
Glucose, Bld: 336 mg/dL — ABNORMAL HIGH (ref 70–99)
Potassium: 4.1 mmol/L (ref 3.5–5.1)
Sodium: 134 mmol/L — ABNORMAL LOW (ref 135–145)
Total Bilirubin: 0.6 mg/dL (ref <1.2)
Total Protein: 6.5 g/dL (ref 6.5–8.1)

## 2023-11-29 MED ORDER — DEXAMETHASONE SODIUM PHOSPHATE 10 MG/ML IJ SOLN
5.0000 mg | Freq: Once | INTRAMUSCULAR | Status: AC
  Administered 2023-11-29: 5 mg via INTRAVENOUS
  Filled 2023-11-29: qty 1

## 2023-11-29 MED ORDER — FAMOTIDINE IN NACL 20-0.9 MG/50ML-% IV SOLN
20.0000 mg | Freq: Once | INTRAVENOUS | Status: AC
  Administered 2023-11-29: 20 mg via INTRAVENOUS
  Filled 2023-11-29: qty 50

## 2023-11-29 MED ORDER — DIPHENHYDRAMINE HCL 50 MG/ML IJ SOLN
25.0000 mg | Freq: Once | INTRAMUSCULAR | Status: AC
  Administered 2023-11-29: 25 mg via INTRAVENOUS
  Filled 2023-11-29: qty 1

## 2023-11-29 MED ORDER — SODIUM CHLORIDE 0.9 % IV SOLN
INTRAVENOUS | Status: DC
Start: 2023-11-29 — End: 2023-11-29

## 2023-11-29 MED ORDER — SODIUM CHLORIDE 0.9% FLUSH
10.0000 mL | INTRAVENOUS | Status: DC | PRN
  Administered 2023-11-29: 10 mL

## 2023-11-29 MED ORDER — SODIUM CHLORIDE 0.9 % IV SOLN
60.0000 mg/m2 | Freq: Once | INTRAVENOUS | Status: AC
  Administered 2023-11-29: 120 mg via INTRAVENOUS
  Filled 2023-11-29: qty 20

## 2023-11-29 MED ORDER — HEPARIN SOD (PORK) LOCK FLUSH 100 UNIT/ML IV SOLN
500.0000 [IU] | Freq: Once | INTRAVENOUS | Status: AC | PRN
  Administered 2023-11-29: 500 [IU]

## 2023-11-29 NOTE — Patient Instructions (Signed)
 [facility] - A DEPT OF [facility]   Discharge Instructions: Thank you for choosing [facility] to provide your oncology and hematology care.   If you have a lab appointment with the Cancer Center, please go directly to the Cancer Center and check in at the registration area.   Wear comfortable clothing and clothing appropriate for easy access to any Portacath or PICC line.   We strive to give you quality time with your provider. You may need to reschedule your appointment if you arrive late (15 or more minutes).  Arriving late affects you and other patients whose appointments are after yours.  Also, if you miss three or more appointments without notifying the office, you may be dismissed from the clinic at the provider's discretion.      For prescription refill requests, have your pharmacy contact our office and allow 72 hours for refills to be completed.    Today you received the following chemotherapy and/or immunotherapy agents TAXOL      To help prevent nausea and vomiting after your treatment, we encourage you to take your nausea medication as directed.  BELOW ARE SYMPTOMS THAT SHOULD BE REPORTED IMMEDIATELY: *FEVER GREATER THAN 100.4 F (38 C) OR HIGHER *CHILLS OR SWEATING *NAUSEA AND VOMITING THAT IS NOT CONTROLLED WITH YOUR NAUSEA MEDICATION *UNUSUAL SHORTNESS OF BREATH *UNUSUAL BRUISING OR BLEEDING *URINARY PROBLEMS (pain or burning when urinating, or frequent urination) *BOWEL PROBLEMS (unusual diarrhea, constipation, pain near the anus) TENDERNESS IN MOUTH AND THROAT WITH OR WITHOUT PRESENCE OF ULCERS (sore throat, sores in mouth, or a toothache) UNUSUAL RASH, SWELLING OR PAIN  UNUSUAL VAGINAL DISCHARGE OR ITCHING   Items with * indicate a potential emergency and should be followed up as soon as possible or go to the Emergency Department if any problems should occur.  Please show the CHEMOTHERAPY ALERT CARD or IMMUNOTHERAPY  ALERT CARD at check-in to the Emergency Department and triage nurse.  Should you have questions after your visit or need to cancel or reschedule your appointment, please contact [facility]  Dept: [phone]  and follow the prompts.  Office hours are 8:00 a.m. to 4:30 p.m. Monday - Friday. Please note that voicemails left after 4:00 p.m. may not be returned until the following business day.  We are closed weekends and major holidays. You have access to a nurse at all times for urgent questions. Please call the main number to the clinic Dept: [phone] and follow the prompts.   For any non-urgent questions, you may also contact your provider using MyChart. We now offer e-Visits for anyone 22 and older to request care online for non-urgent symptoms. For details visit [email or web address].   Also download the MyChart app! Go to the app store, search "MyChart", open the app, select [facility], and log in with your MyChart username and password.  Paclitaxel Injection What is this medication? PACLITAXEL (PAK li TAX el) treats some types of cancer. It works by slowing down the growth of cancer cells. This medicine may be used for other purposes; ask your health care provider or pharmacist if you have questions. COMMON BRAND NAME(S): Onxol, Taxol What should I tell my care team before I take this medication? They need to know if you have any of these conditions: Heart disease Liver disease Low white blood cell levels An unusual or allergic reaction to paclitaxel, other medications, foods, dyes, or preservatives  If you or your partner are pregnant or trying to get pregnant Breast-feeding How should I use this medication? This medication is injected into a vein. It is given by your care team in a hospital or clinic setting. Talk to your care team about the use of this medication in children. While it may be given to children for selected  conditions, precautions do apply. Overdosage: If you think you have taken too much of this medicine contact a poison control center or emergency room at once. NOTE: This medicine is only for you. Do not share this medicine with others. What if I miss a dose? Keep appointments for follow-up doses. It is important not to miss your dose. Call your care team if you are unable to keep an appointment. What may interact with this medication? Do not take this medication with any of the following: Live virus vaccines Other medications may affect the way this medication works. Talk with your care team about all of the medications you take. They may suggest changes to your treatment plan to lower the risk of side effects and to make sure your medications work as intended. This list may not describe all possible interactions. Give your health care provider a list of all the medicines, herbs, non-prescription drugs, or dietary supplements you use. Also tell them if you smoke, drink alcohol, or use illegal drugs. Some items may interact with your medicine. What should I watch for while using this medication? Your condition will be monitored carefully while you are receiving this medication. You may need blood work while taking this medication. This medication may make you feel generally unwell. This is not uncommon as chemotherapy can affect healthy cells as well as cancer cells. Report any side effects. Continue your course of treatment even though you feel ill unless your care team tells you to stop. This medication can cause serious allergic reactions. To reduce the risk, your care team may give you other medications to take before receiving this one. Be sure to follow the directions from your care team. This medication may increase your risk of getting an infection. Call your care team for advice if you get a fever, chills, sore throat, or other symptoms of a cold or flu. Do not treat yourself. Try to avoid  being around people who are sick. This medication may increase your risk to bruise or bleed. Call your care team if you notice any unusual bleeding. Be careful brushing or flossing your teeth or using a toothpick because you may get an infection or bleed more easily. If you have any dental work done, tell your dentist you are receiving this medication. Talk to your care team if you may be pregnant. Serious birth defects can occur if you take this medication during pregnancy. Talk to your care team before breastfeeding. Changes to your treatment plan may be needed. What side effects may I notice from receiving this medication? Side effects that you should report to your care team as soon as possible: Allergic reactions--skin rash, itching, hives, swelling of the face, lips, tongue, or throat Heart rhythm changes--fast or irregular heartbeat, dizziness, feeling faint or lightheaded, chest pain, trouble breathing Increase in blood pressure Infection--fever, chills, cough, sore throat, wounds that don't heal, pain or trouble when passing urine, general feeling of discomfort or being unwell Low blood pressure--dizziness, feeling faint or lightheaded, blurry vision Low red blood cell level--unusual weakness or fatigue, dizziness, headache, trouble breathing Painful swelling, warmth, or redness of the skin, blisters  or sores at the infusion site Pain, tingling, or numbness in the hands or feet Slow heartbeat--dizziness, feeling faint or lightheaded, confusion, trouble breathing, unusual weakness or fatigue Unusual bruising or bleeding Side effects that usually do not require medical attention (report to your care team if they continue or are bothersome): Diarrhea Hair loss Joint pain Loss of appetite Muscle pain Nausea Vomiting This list may not describe all possible side effects. Call your doctor for medical advice about side effects. You may report side effects to FDA at 1-800-FDA-1088. Where  should I keep my medication? This medication is given in a hospital or clinic. It will not be stored at home. NOTE: This sheet is a summary. It may not cover all possible information. If you have questions about this medicine, talk to your doctor, pharmacist, or health care provider.  2024 Elsevier/Gold Standard (2022-04-25 00:00:00)

## 2023-11-29 NOTE — Progress Notes (Signed)
[facility] OFFICE PROGRESS NOTE   Diagnosis: Anal cancer  INTERVAL HISTORY:   Beth [name] completed day 1 cycle 2 chemotherapy 11/14/2023.  She received retifanlimab this cycle.  No nausea, symptom of allergic reaction, numbness, rash, or diarrhea.  She has intermittent right abdominal pain.  She takes oxycodone as needed. She reports her blood sugar is elevated after receiving chemotherapy. Objective:  Vital signs in last 24 hours:  Blood pressure 119/79, pulse 97, temperature 98.1 F (36.7 C), temperature source Temporal, resp. rate 18, height 5\' 7"  (1.702 m), weight 175 lb 3.2 oz (79.5 kg), last menstrual period 03/13/2012, SpO2 100%.    HEENT: No thrush or ulcers Resp: Lungs clear bilaterally Cardio: Regular rate and rhythm  GI: No hepatosplenomegaly, no mass, nontender, left lower quadrant colostomy Vascular: No leg edema   Portacath/PICC-without erythema  Lab Results:  Lab Results  Component Value Date   WBC 2.2 (L) 11/29/2023   HGB 12.3 11/29/2023   HCT 35.4 (L) 11/29/2023   MCV 98.3 11/29/2023   PLT 136 (L) 11/29/2023   NEUTROABS 1.4 (L) 11/29/2023    CMP  Lab Results  Component Value Date   NA 134 (L) 11/29/2023   K 4.1 11/29/2023   CL 102 11/29/2023   CO2 24 11/29/2023   GLUCOSE 336 (H) 11/29/2023   BUN 9 11/29/2023   CREATININE 0.69 11/29/2023   CALCIUM 9.2 11/29/2023   PROT 6.5 11/29/2023   ALBUMIN 4.1 11/29/2023   AST 49 (H) 11/29/2023   ALT 123 (H) 11/29/2023   ALKPHOS 97 11/29/2023   BILITOT 0.6 11/29/2023   GFRNONAA >60 11/29/2023   GFRAA 106 11/29/2020    Lab Results  Component Value Date   CEA 1.39 10/10/2023    Lab Results  Component Value Date   INR 1.1 09/07/2023   LABPROT 14.1 09/07/2023    Imaging:  No results found.  Medications: I have reviewed the patient's current medications.   Assessment/Plan: Squamous cell carcinoma of the anal canal Colonoscopy 01/28/2021-rectal mass extending to the  "outside", palpable on exam-biopsy revealed high-grade squamous intraepithelial lesion (AIN 3/carcinoma in situ) Exam under anesthesia with transanal excision of an anal canal mass 02/25/2021-frozen section consistent with squamous cell carcinoma, 4 x 4 centimeter sessile anal canal mass extending to the anal verge, right lateral and posterior, 30% circumferential, fixed to the anal sphincter complex; anal rectal mass with mixed high-grade neuroendocrine squamous cell carcinoma; posterior anal tag with squamous cell carcinoma; left labial lesion-condyloma acuminata CTs 03/07/2021-ill-definition of tissue planes along the anus with abnormal soft tissue prominence posteriorly along the anal canal suspicious for mass.  Small adjacent lower perirectal lymph nodes in addition to a 0.5 cm sacral node.  Focal enhancing lesion in the right hepatic lobe 1.2 x 0.8 x 0.9 cm.  2.1 x 1.5 cm cystic lesion right ovary with thin enhancing rim. PET scan 03/16/2021-hypermetabolic mass within the anal canal.  No definite evidence of local or distant metastatic disease.  7 mm left submandibular lymph node with mild FDG uptake, likely reactive.  Focal site of FDG uptake within the inner quadrant of the right breast. Radiation 03/21/2021-04/27/2021 Cycle one 5-FU/mitomycin-C 03/21/2021 Cycle two 5-FU/Mitomycin-C 04/18/2021, 5-FU dose reduced secondary to mucositis Anoscopy by Dr. Michaell Cowing 07/20/2021-divet in the right posterior lateral aspect with some right anterior and right lateral folds consistent with radiation treatment.  No ulceration.  Not fully resolved but markedly shrunk down. Anorectal mass 09/30/2021 examination under anesthesia, biopsy-poorly differentiated carcinoma-basaloid squamous carcinoma and high-grade  neuroendocrine carcinoma, margins positive APR/and vertical rectus abdominis flap 11/03/2021, invasive poorly differentiated carcinoma, negative resection margins, 0/10 nodes, treatment response score 2,ypT1,ypN0 CT  abdomen/pelvis 12/03/2021-high-grade small bowel obstruction transitioning in the left lower abdomen.  Presacral rim-enhancing 4.4 x 2.8 x 3.8 cm fluid collection.  No air in the collection.  Prior study described a potential abnormality in the right lobe of the liver, not seen on current study.  Cystic lesion of the right ovary resolved. CT chest 12/07/2021-interval development of bilateral trace pleural effusions. CTs 12/04/2022-new 2.6 x 2.5 cm right lower lobe nodule PET 12/21/2022-FDG avid right lower lobe nodule, no other evidence of metastatic disease 12/29/2022 right lower lobectomy, lymph node dissection-poorly differentiated carcinoma consistent with metastasis, 4.8 x 2.9 x 2.6 cm, margins free, 7 lymph nodes negative for carcinoma; cytohistomorphology correlation supports the current tumor being a metastasis from the previously diagnosed anal carcinoma CT chest 04/17/2023-interval right lower lobectomy with resection of right lung mass, pleural thickening and small right pleural effusion, no new mass or lymph node enlargement CTs 07/20/2023-diminished postoperative right pleural effusion.  Unchanged postoperative findings status post abdominoperineal resection with left lower quadrant and colostomy.  No evidence of metastatic disease. CT abdomen/pelvis 08/31/2023-heterogenous soft tissue nodule in the right upper quadrant anterior abdominal wall, no evidence of metastatic disease CT/ultrasound guided biopsy of right abdominal wall mass 09/07/2023-metastatic neoplasm consistent with a metastasis from anal cancer, Foundation 1-HRD signature negative, MSS, tumor mutation burden 4, BRCA 1 alteration, PD-L1 TPS 10% Cycle 1 day 1 Taxol//carboplatin 10/19/2023, day 8 Taxol 10/26/2023, day 15 Taxol held 11/02/2023 due to neutropenia Cycle 2-day 1 Taxol/carboplatin plus Retifanlimab 11/14/2023, Taxol and carboplatin dose reduced due to previous neutropenia, mild thrombocytopenia; day 8 Taxol 11/22/2023, day 15  Taxol 11/29/2023   Remote history of an anal fissure repair Tobacco use Moderate alcohol use Eczema Right breast mass- PET scan 03/16/2021 with focal FDG activity in the inner right breast Mammogram and right breast ultrasound 03/28/2021- negative Bilateral breast MRI 03/31/2021- 7 mm mass in the upper inner right breast correlating with the PET findings, no other abnormality, no adenopathy MRI guided biopsy of right breast mass 04/11/2021- fibrocystic change with sclerosing adenosis, small focus of inflammation/fibrosis suggestive of resolving fat necrosis, no evidence of malignancy--repeat bilateral breast MRI at a 43-month interval; bilateral breast MRI on 10/13/2021-no evidence of breast malignancy.  Annual screening mammography recommended.   7.  Admission with small bowel obstruction 12/03/2021 status post exploratory laparotomy and lysis of adhesions 8.  Right abdominal pain/tenderness-potentially related to the right abdominal wall mass noted on CT 08/31/2023 9.  Elevated liver enzymes-chronic, etiology unclear 10.  Hematuria-evaluated by urology 09/11/2023, cystoscopy with radiation and infection changes, no tumor seen      Disposition: Beth [name] is tolerating the systemic therapy well.  She tolerated the PD-1 inhibitor without acute toxicity.  She will complete day 15 Taxol today.  I dose reduced the Decadron due to hyperglycemia.  She has mild neutropenia today.  She will call for a fever.  Beth [name] will return for an office visit and day 1 cycle 3 on 12/14/2023.  She will be scheduled for a restaging CT after cycle 3.  Thornton Papas, MD  11/29/2023  9:27 AM

## 2023-11-29 NOTE — Progress Notes (Signed)
Patient seen by Dr. Thornton Papas today  Vitals are within treatment parameters:Yes   Labs are within treatment parameters: No (Please specify and give further instructions.)  ANC- 1.4, ALT-123, Glucose-336 Okay to proceed  Treatment plan has been signed: Yes   Per physician team, Patient is ready for treatment. Please note the following modifications: Decadron decreased to 5mg s

## 2023-11-30 ENCOUNTER — Telehealth (HOSPITAL_BASED_OUTPATIENT_CLINIC_OR_DEPARTMENT_OTHER): Payer: Self-pay | Admitting: Oncology

## 2023-12-01 ENCOUNTER — Other Ambulatory Visit: Payer: Self-pay

## 2023-12-04 ENCOUNTER — Other Ambulatory Visit: Payer: Self-pay

## 2023-12-09 ENCOUNTER — Other Ambulatory Visit: Payer: Self-pay | Admitting: Oncology

## 2023-12-14 ENCOUNTER — Inpatient Hospital Stay: Payer: BC Managed Care – PPO | Admitting: Nurse Practitioner

## 2023-12-14 ENCOUNTER — Inpatient Hospital Stay: Payer: BC Managed Care – PPO

## 2023-12-14 ENCOUNTER — Encounter: Payer: Self-pay | Admitting: Nurse Practitioner

## 2023-12-14 ENCOUNTER — Other Ambulatory Visit: Payer: Self-pay | Admitting: Nurse Practitioner

## 2023-12-14 VITALS — BP 116/78 | HR 83

## 2023-12-14 VITALS — BP 132/93 | HR 92 | Temp 98.2°F | Resp 18 | Ht 67.0 in | Wt 175.3 lb

## 2023-12-14 DIAGNOSIS — C210 Malignant neoplasm of anus, unspecified: Secondary | ICD-10-CM

## 2023-12-14 LAB — CMP (CANCER CENTER ONLY)
ALT: 91 U/L — ABNORMAL HIGH (ref 0–44)
AST: 56 U/L — ABNORMAL HIGH (ref 15–41)
Albumin: 4.1 g/dL (ref 3.5–5.0)
Alkaline Phosphatase: 104 U/L (ref 38–126)
Anion gap: 8 (ref 5–15)
BUN: 9 mg/dL (ref 6–20)
CO2: 26 mmol/L (ref 22–32)
Calcium: 9.5 mg/dL (ref 8.9–10.3)
Chloride: 103 mmol/L (ref 98–111)
Creatinine: 0.58 mg/dL (ref 0.44–1.00)
GFR, Estimated: >60 mL/min (ref >60)
Glucose, Bld: 243 mg/dL — ABNORMAL HIGH (ref 70–99)
Potassium: 3.9 mmol/L (ref 3.5–5.1)
Sodium: 137 mmol/L (ref 135–145)
Total Bilirubin: 0.8 mg/dL (ref <1.2)
Total Protein: 6.9 g/dL (ref 6.5–8.1)

## 2023-12-14 LAB — CBC WITH DIFFERENTIAL (CANCER CENTER ONLY)
Abs Immature Granulocytes: 0.01 10*3/uL (ref 0.00–0.07)
Basophils Absolute: 0 10*3/uL (ref 0.0–0.1)
Basophils Relative: 1 %
Eosinophils Absolute: 0.1 10*3/uL (ref 0.0–0.5)
Eosinophils Relative: 2 %
HCT: 36 % (ref 36.0–46.0)
Hemoglobin: 12.4 g/dL (ref 12.0–15.0)
Immature Granulocytes: 0 %
Lymphocytes Relative: 16 %
Lymphs Abs: 0.5 10*3/uL — ABNORMAL LOW (ref 0.7–4.0)
MCH: 34.3 pg — ABNORMAL HIGH (ref 26.0–34.0)
MCHC: 34.4 g/dL (ref 30.0–36.0)
MCV: 99.7 fL (ref 80.0–100.0)
Monocytes Absolute: 0.4 10*3/uL (ref 0.1–1.0)
Monocytes Relative: 13 %
Neutro Abs: 2.2 10*3/uL (ref 1.7–7.7)
Neutrophils Relative %: 68 %
Platelet Count: 121 10*3/uL — ABNORMAL LOW (ref 150–400)
RBC: 3.61 MIL/uL — ABNORMAL LOW (ref 3.87–5.11)
RDW: 14.8 % (ref 11.5–15.5)
WBC Count: 3.2 10*3/uL — ABNORMAL LOW (ref 4.0–10.5)
nRBC: 0 % (ref 0.0–0.2)

## 2023-12-14 MED ORDER — PALONOSETRON HCL INJECTION 0.25 MG/5ML
0.2500 mg | Freq: Once | INTRAVENOUS | Status: AC
  Administered 2023-12-14: 0.25 mg via INTRAVENOUS
  Filled 2023-12-14: qty 5

## 2023-12-14 MED ORDER — CARBOPLATIN CHEMO INJECTION 450 MG/45ML
380.0000 mg | Freq: Once | INTRAVENOUS | Status: AC
  Administered 2023-12-14: 380 mg via INTRAVENOUS
  Filled 2023-12-14: qty 38

## 2023-12-14 MED ORDER — HEPARIN SOD (PORK) LOCK FLUSH 100 UNIT/ML IV SOLN
500.0000 [IU] | Freq: Once | INTRAVENOUS | Status: AC | PRN
  Administered 2023-12-14: 500 [IU]

## 2023-12-14 MED ORDER — RETIFANLIMAB-DLWR CHEMO 500MG/20ML IV SOLN
500.0000 mg | Freq: Once | INTRAVENOUS | Status: AC
  Administered 2023-12-14: 500 mg via INTRAVENOUS
  Filled 2023-12-14: qty 20

## 2023-12-14 MED ORDER — SODIUM CHLORIDE 0.9 % IV SOLN
INTRAVENOUS | Status: DC
Start: 2023-12-14 — End: 2023-12-14

## 2023-12-14 MED ORDER — DIPHENHYDRAMINE HCL 50 MG/ML IJ SOLN
25.0000 mg | Freq: Once | INTRAMUSCULAR | Status: AC
  Administered 2023-12-14: 25 mg via INTRAVENOUS
  Filled 2023-12-14: qty 1

## 2023-12-14 MED ORDER — SODIUM CHLORIDE 0.9% FLUSH
10.0000 mL | INTRAVENOUS | Status: DC | PRN
Start: 2023-12-14 — End: 2023-12-14
  Administered 2023-12-14: 10 mL

## 2023-12-14 MED ORDER — DEXAMETHASONE SODIUM PHOSPHATE 10 MG/ML IJ SOLN
10.0000 mg | Freq: Once | INTRAMUSCULAR | Status: AC
  Administered 2023-12-14: 10 mg via INTRAVENOUS
  Filled 2023-12-14: qty 1

## 2023-12-14 MED ORDER — SODIUM CHLORIDE 0.9 % IV SOLN
60.0000 mg/m2 | Freq: Once | INTRAVENOUS | Status: AC
  Administered 2023-12-14: 120 mg via INTRAVENOUS
  Filled 2023-12-14: qty 20

## 2023-12-14 MED ORDER — SODIUM CHLORIDE 0.9 % IV SOLN
150.0000 mg | Freq: Once | INTRAVENOUS | Status: AC
  Administered 2023-12-14: 150 mg via INTRAVENOUS
  Filled 2023-12-14: qty 150

## 2023-12-14 MED ORDER — FAMOTIDINE IN NACL 20-0.9 MG/50ML-% IV SOLN
20.0000 mg | Freq: Once | INTRAVENOUS | Status: AC
  Administered 2023-12-14: 20 mg via INTRAVENOUS
  Filled 2023-12-14: qty 50

## 2023-12-14 NOTE — Progress Notes (Signed)
[facility] OFFICE PROGRESS NOTE   Diagnosis: Anal cancer  INTERVAL HISTORY:   [name] returns as scheduled.  She completed cycle 2 Taxol/carboplatin plus retifanlimab beginning 11/14/2023.  No significant nausea/vomiting.  No mouth sores.  No diarrhea.  No rash.  No cough or shortness of breath.  She has a good appetite.  Energy level varies.  Pain continues to improve.  She is taking pain medication less frequently.  Objective:  Vital signs in last 24 hours:  Blood pressure (!) 132/93, pulse 92, temperature 98.2 F (36.8 C), temperature source Temporal, resp. rate 18, height 5\' 7"  (1.702 m), weight 175 lb 4.8 oz (79.5 kg), last menstrual period 03/13/2012, SpO2 100%.    HEENT: No thrush or ulcers. Resp: Lungs clear bilaterally. Cardio: Regular rate and rhythm. GI: Abdomen soft.  No hepatomegaly.  Tender at the right upper quadrant.  Left lower quadrant colostomy. Vascular: No leg edema. Skin: No rash. Port-A-Cath without erythema.  Lab Results:  Lab Results  Component Value Date   WBC 3.2 (L) 12/14/2023   HGB 12.4 12/14/2023   HCT 36.0 12/14/2023   MCV 99.7 12/14/2023   PLT 121 (L) 12/14/2023   NEUTROABS 2.2 12/14/2023    Imaging:  No results found.  Medications: I have reviewed the patient's current medications.  Assessment/Plan: Squamous cell carcinoma of the anal canal Colonoscopy 01/28/2021-rectal mass extending to the "outside", palpable on exam-biopsy revealed high-grade squamous intraepithelial lesion (AIN 3/carcinoma in situ) Exam under anesthesia with transanal excision of an anal canal mass 02/25/2021-frozen section consistent with squamous cell carcinoma, 4 x 4 centimeter sessile anal canal mass extending to the anal verge, right lateral and posterior, 30% circumferential, fixed to the anal sphincter complex; anal rectal mass with mixed high-grade neuroendocrine squamous cell carcinoma; posterior anal tag with squamous cell carcinoma; left  labial lesion-condyloma acuminata CTs 03/07/2021-ill-definition of tissue planes along the anus with abnormal soft tissue prominence posteriorly along the anal canal suspicious for mass.  Small adjacent lower perirectal lymph nodes in addition to a 0.5 cm sacral node.  Focal enhancing lesion in the right hepatic lobe 1.2 x 0.8 x 0.9 cm.  2.1 x 1.5 cm cystic lesion right ovary with thin enhancing rim. PET scan 03/16/2021-hypermetabolic mass within the anal canal.  No definite evidence of local or distant metastatic disease.  7 mm left submandibular lymph node with mild FDG uptake, likely reactive.  Focal site of FDG uptake within the inner quadrant of the right breast. Radiation 03/21/2021-04/27/2021 Cycle one 5-FU/mitomycin-C 03/21/2021 Cycle two 5-FU/Mitomycin-C 04/18/2021, 5-FU dose reduced secondary to mucositis Anoscopy by Dr. Michaell Cowing 07/20/2021-divet in the right posterior lateral aspect with some right anterior and right lateral folds consistent with radiation treatment.  No ulceration.  Not fully resolved but markedly shrunk down. Anorectal mass 09/30/2021 examination under anesthesia, biopsy-poorly differentiated carcinoma-basaloid squamous carcinoma and high-grade neuroendocrine carcinoma, margins positive APR/and vertical rectus abdominis flap 11/03/2021, invasive poorly differentiated carcinoma, negative resection margins, 0/10 nodes, treatment response score 2,ypT1,ypN0 CT abdomen/pelvis 12/03/2021-high-grade small bowel obstruction transitioning in the left lower abdomen.  Presacral rim-enhancing 4.4 x 2.8 x 3.8 cm fluid collection.  No air in the collection.  Prior study described a potential abnormality in the right lobe of the liver, not seen on current study.  Cystic lesion of the right ovary resolved. CT chest 12/07/2021-interval development of bilateral trace pleural effusions. CTs 12/04/2022-new 2.6 x 2.5 cm right lower lobe nodule PET 12/21/2022-FDG avid right lower lobe nodule, no other evidence  of  metastatic disease 12/29/2022 right lower lobectomy, lymph node dissection-poorly differentiated carcinoma consistent with metastasis, 4.8 x 2.9 x 2.6 cm, margins free, 7 lymph nodes negative for carcinoma; cytohistomorphology correlation supports the current tumor being a metastasis from the previously diagnosed anal carcinoma CT chest 04/17/2023-interval right lower lobectomy with resection of right lung mass, pleural thickening and small right pleural effusion, no new mass or lymph node enlargement CTs 07/20/2023-diminished postoperative right pleural effusion.  Unchanged postoperative findings status post abdominoperineal resection with left lower quadrant and colostomy.  No evidence of metastatic disease. CT abdomen/pelvis 08/31/2023-heterogenous soft tissue nodule in the right upper quadrant anterior abdominal wall, no evidence of metastatic disease CT/ultrasound guided biopsy of right abdominal wall mass 09/07/2023-metastatic neoplasm consistent with a metastasis from anal cancer, Foundation 1-HRD signature negative, MSS, tumor mutation burden 4, BRCA 1 alteration, PD-L1 TPS 10% Cycle 1 day 1 Taxol//carboplatin 10/19/2023, day 8 Taxol 10/26/2023, day 15 Taxol held 11/02/2023 due to neutropenia Cycle 2-day 1 Taxol/carboplatin plus Retifanlimab 11/14/2023, Taxol and carboplatin dose reduced due to previous neutropenia, mild thrombocytopenia; day 8 Taxol 11/22/2023, day 15 Taxol 11/29/2023 Cycle 3-day 1 Taxol/carboplatin plus retifanlimab 12/14/2023   Remote history of an anal fissure repair Tobacco use Moderate alcohol use Eczema Right breast mass- PET scan 03/16/2021 with focal FDG activity in the inner right breast Mammogram and right breast ultrasound 03/28/2021- negative Bilateral breast MRI 03/31/2021- 7 mm mass in the upper inner right breast correlating with the PET findings, no other abnormality, no adenopathy MRI guided biopsy of right breast mass 04/11/2021- fibrocystic change with sclerosing  adenosis, small focus of inflammation/fibrosis suggestive of resolving fat necrosis, no evidence of malignancy--repeat bilateral breast MRI at a 60-month interval; bilateral breast MRI on 10/13/2021-no evidence of breast malignancy.  Annual screening mammography recommended.   7.  Admission with small bowel obstruction 12/03/2021 status post exploratory laparotomy and lysis of adhesions 8.  Right abdominal pain/tenderness-potentially related to the right abdominal wall mass noted on CT 08/31/2023 9.  Elevated liver enzymes-chronic, etiology unclear 10.  Hematuria-evaluated by urology 09/11/2023, cystoscopy with radiation and infection changes, no tumor seen    Disposition: Beth [name] appears stable.  She has completed 2 cycles of Taxol/carboplatin, retifanlimab added with cycle 2.  She is tolerating treatment well.  Pain is better.  Plan to proceed with cycle 3 today as scheduled.  Restaging CTs prior to cycle 4.  CBC and chemistry panel reviewed.  Labs adequate to proceed as above.  She will return for follow-up and day 8 Taxol in 1 week.  We are available to see her sooner if needed.   [name] ANP/GNP-BC   12/14/2023  12:05 PM

## 2023-12-14 NOTE — Patient Instructions (Signed)
[facility] - A DEPT OF [facility]   Discharge Instructions: Thank you for choosing [facility] to provide your oncology and hematology care.   If you have a lab appointment with the Cancer Center, please go directly to the Cancer Center and check in at the registration area.   Wear comfortable clothing and clothing appropriate for easy access to any Portacath or PICC line.   We strive to give you quality time with your provider. You may need to reschedule your appointment if you arrive late (15 or more minutes).  Arriving late affects you and other patients whose appointments are after yours.  Also, if you miss three or more appointments without notifying the office, you may be dismissed from the clinic at the provider's discretion.      For prescription refill requests, have your pharmacy contact our office and allow 72 hours for refills to be completed.    Today you received the following chemotherapy and/or immunotherapy agents ZYNYZ/TAXOL/CARBOPLATIN      To help prevent nausea and vomiting after your treatment, we encourage you to take your nausea medication as directed.  BELOW ARE SYMPTOMS THAT SHOULD BE REPORTED IMMEDIATELY: *FEVER GREATER THAN 100.4 F (38 C) OR HIGHER *CHILLS OR SWEATING *NAUSEA AND VOMITING THAT IS NOT CONTROLLED WITH YOUR NAUSEA MEDICATION *UNUSUAL SHORTNESS OF BREATH *UNUSUAL BRUISING OR BLEEDING *URINARY PROBLEMS (pain or burning when urinating, or frequent urination) *BOWEL PROBLEMS (unusual diarrhea, constipation, pain near the anus) TENDERNESS IN MOUTH AND THROAT WITH OR WITHOUT PRESENCE OF ULCERS (sore throat, sores in mouth, or a toothache) UNUSUAL RASH, SWELLING OR PAIN  UNUSUAL VAGINAL DISCHARGE OR ITCHING   Items with * indicate a potential emergency and should be followed up as soon as possible or go to the Emergency Department if any problems should occur.  Please show the CHEMOTHERAPY ALERT CARD or  IMMUNOTHERAPY ALERT CARD at check-in to the Emergency Department and triage nurse.  Should you have questions after your visit or need to cancel or reschedule your appointment, please contact [facility] - [facility]PT OF [facility]  Dept: [phone]  and follow the prompts.  Office hours are 8:00 a.m. to 4:30 p.m. Monday - Friday. Please note that voicemails left after 4:00 p.m. may not be returned until the following business day.  We are closed weekends and major holidays. You have access to a nurse at all times for urgent questions. Please call the main number to the clinic Dept: [phone] and follow the prompts.   For any non-urgent questions, you may also contact your provider using MyChart. We now offer e-Visits for anyone 10 and older to request care online for non-urgent symptoms. For details visit [email or web address].   Also download the MyChart app! Go to the app store, search "MyChart", open the app, select [facility], and log in with your MyChart username and password.  Retifanlimab Injection What is this medication? RETIFANLIMAB (RE ti FAN li mab) treats skin cancer. It works by helping your immune system slow or stop the spread of cancer cells. It is a monoclonal antibody. This medicine may be used for other purposes; ask your health care provider or pharmacist if you have questions. COMMON BRAND NAME(S): ZYNYZ What should I tell my care team before I take this medication? They need to know if you have any of these conditions: Allogeneic stem cell transplant (uses someone else's stem cells) Autoimmune diseases, such as Crohn's  disease, ulcerative colitis, lupus History of chest radiation Nervous system problems, such as Guillain-Barre syndrome or myasthenia gravis Organ transplant An unusual or allergic reaction to retifanlimab, other medications, foods, dyes, or preservatives Pregnant or trying to get pregnant Breast-feeding How should I  use this medication? This medication is infused into a vein. It is given by your care team in a hospital or clinic setting. A special MedGuide will be given to you before each treatment. Be sure to read this information carefully each time. Talk to your care team about the use of this medication in children. Special care may be needed. Overdosage: If you think you have taken too much of this medicine contact a poison control center or emergency room at once. NOTE: This medicine is only for you. Do not share this medicine with others. What if I miss a dose? Keep appointments for follow-up doses. It is important not to miss your dose. Call your care team if you are unable to keep an appointment. What may interact with this medication? Interactions have not been studied. This list may not describe all possible interactions. Give your health care provider a list of all the medicines, herbs, non-prescription drugs, or dietary supplements you use. Also tell them if you smoke, drink alcohol, or use illegal drugs. Some items may interact with your medicine. What should I watch for while using this medication? Visit your care team for regular checks on your progress. It may be some time before you see the benefit from this medication. You may need blood work while taking this medication. This medication may cause serious skin reactions. They can happen weeks to months after starting the medication. Contact your care team right away if you notice fevers or flu-like symptoms with a rash. The rash may be red or purple and then turn into blisters or peeling of the skin. You may also notice a red rash with swelling of the face, lips, or lymph nodes in your neck or under your arms. Tell your care team right away if you have any change in your eyesight. Talk to your care team if you wish to become pregnant or think you might be pregnant. A negative pregnancy test is required before starting this medication. A  reliable form of contraception is recommended while taking this medication and for 4 months after stopping therapy. Talk to your care team about effective forms of contraception. Do not breast-feed while taking this medication and for 4 months after stopping therapy. What side effects may I notice from receiving this medication? Side effects that you should report to your care team as soon as possible: Allergic reactions--skin rash, itching, hives, swelling of the face, lips, tongue, or throat Dry cough, shortness of breath or trouble breathing Eye pain, redness, irritation, or discharge with blurry or decreased vision Heart muscle inflammation--unusual weakness or fatigue, shortness of breath, chest pain, fast or irregular heartbeat, dizziness, swelling of the ankles, feet, or hands Hormone gland problems--headache, sensitivity to light, unusual weakness or fatigue, dizziness, fast or irregular heartbeat, increased sensitivity to cold or heat, excessive sweating, constipation, hair loss, increased thirst or amount of urine, tremors or shaking, irritability Infusion reactions--chest pain, shortness of breath or trouble breathing, feeling faint or lightheaded Kidney injury (glomerulonephritis)--decrease in the amount of urine, red or dark brown urine, foamy or bubbly urine, swelling of the ankles, hands, or feet Liver injury--right upper belly pain, loss of appetite, nausea, light-colored stool, dark yellow or brown urine, yellowing skin or eyes, unusual  weakness or fatigue Pain, tingling, or numbness in the hands or feet, muscle weakness, change in vision, confusion or trouble speaking, loss of balance or coordination, trouble walking, seizures Rash, fever, and swollen lymph nodes Redness, blistering, peeling, or loosening of the skin, including inside the mouth Sudden or severe stomach pain, bloody diarrhea, fever, nausea, vomiting Side effects that usually do not require medical attention (report  these to your care team if they continue or are bothersome): Bone, joint, or muscle pain Diarrhea Fatigue Loss of appetite Nausea Skin rash This list may not describe all possible side effects. Call your doctor for medical advice about side effects. You may report side effects to FDA at 1-800-FDA-1088. Where should I keep my medication? This medication is given in a hospital or clinic. It will not be stored at home. NOTE: This sheet is a summary. It may not cover all possible information. If you have questions about this medicine, talk to your doctor, pharmacist, or health care provider.  2024 Elsevier/Gold Standard (2022-03-28 00:00:00)  Paclitaxel Injection What is this medication? PACLITAXEL (PAK li TAX el) treats some types of cancer. It works by slowing down the growth of cancer cells. This medicine may be used for other purposes; ask your health care provider or pharmacist if you have questions. COMMON BRAND NAME(S): Onxol, Taxol What should I tell my care team before I take this medication? They need to know if you have any of these conditions: Heart disease Liver disease Low white blood cell levels An unusual or allergic reaction to paclitaxel, other medications, foods, dyes, or preservatives If you or your partner are pregnant or trying to get pregnant Breast-feeding How should I use this medication? This medication is injected into a vein. It is given by your care team in a hospital or clinic setting. Talk to your care team about the use of this medication in children. While it may be given to children for selected conditions, precautions do apply. Overdosage: If you think you have taken too much of this medicine contact a poison control center or emergency room at once. NOTE: This medicine is only for you. Do not share this medicine with others. What if I miss a dose? Keep appointments for follow-up doses. It is important not to miss your dose. Call your care team if you  are unable to keep an appointment. What may interact with this medication? Do not take this medication with any of the following: Live virus vaccines Other medications may affect the way this medication works. Talk with your care team about all of the medications you take. They may suggest changes to your treatment plan to lower the risk of side effects and to make sure your medications work as intended. This list may not describe all possible interactions. Give your health care provider a list of all the medicines, herbs, non-prescription drugs, or dietary supplements you use. Also tell them if you smoke, drink alcohol, or use illegal drugs. Some items may interact with your medicine. What should I watch for while using this medication? Your condition will be monitored carefully while you are receiving this medication. You may need blood work while taking this medication. This medication may make you feel generally unwell. This is not uncommon as chemotherapy can affect healthy cells as well as cancer cells. Report any side effects. Continue your course of treatment even though you feel ill unless your care team tells you to stop. This medication can cause serious allergic reactions. To reduce the  risk, your care team may give you other medications to take before receiving this one. Be sure to follow the directions from your care team. This medication may increase your risk of getting an infection. Call your care team for advice if you get a fever, chills, sore throat, or other symptoms of a cold or flu. Do not treat yourself. Try to avoid being around people who are sick. This medication may increase your risk to bruise or bleed. Call your care team if you notice any unusual bleeding. Be careful brushing or flossing your teeth or using a toothpick because you may get an infection or bleed more easily. If you have any dental work done, tell your dentist you are receiving this medication. Talk to your  care team if you may be pregnant. Serious birth defects can occur if you take this medication during pregnancy. Talk to your care team before breastfeeding. Changes to your treatment plan may be needed. What side effects may I notice from receiving this medication? Side effects that you should report to your care team as soon as possible: Allergic reactions--skin rash, itching, hives, swelling of the face, lips, tongue, or throat Heart rhythm changes--fast or irregular heartbeat, dizziness, feeling faint or lightheaded, chest pain, trouble breathing Increase in blood pressure Infection--fever, chills, cough, sore throat, wounds that don't heal, pain or trouble when passing urine, general feeling of discomfort or being unwell Low blood pressure--dizziness, feeling faint or lightheaded, blurry vision Low red blood cell level--unusual weakness or fatigue, dizziness, headache, trouble breathing Painful swelling, warmth, or redness of the skin, blisters or sores at the infusion site Pain, tingling, or numbness in the hands or feet Slow heartbeat--dizziness, feeling faint or lightheaded, confusion, trouble breathing, unusual weakness or fatigue Unusual bruising or bleeding Side effects that usually do not require medical attention (report to your care team if they continue or are bothersome): Diarrhea Hair loss Joint pain Loss of appetite Muscle pain Nausea Vomiting This list may not describe all possible side effects. Call your doctor for medical advice about side effects. You may report side effects to FDA at 1-800-FDA-1088. Where should I keep my medication? This medication is given in a hospital or clinic. It will not be stored at home. NOTE: This sheet is a summary. It may not cover all possible information. If you have questions about this medicine, talk to your doctor, pharmacist, or health care provider.  2024 Elsevier/Gold Standard (2022-04-25 00:00:00)  Carboplatin Injection What is  this medication? CARBOPLATIN (KAR boe pla tin) treats some types of cancer. It works by slowing down the growth of cancer cells. This medicine may be used for other purposes; ask your health care provider or pharmacist if you have questions. COMMON BRAND NAME(S): Paraplatin What should I tell my care team before I take this medication? They need to know if you have any of these conditions: Blood disorders Hearing problems Kidney disease Recent or ongoing radiation therapy An unusual or allergic reaction to carboplatin, cisplatin, other medications, foods, dyes, or preservatives Pregnant or trying to get pregnant Breast-feeding How should I use this medication? This medication is injected into a vein. It is given by your care team in a hospital or clinic setting. Talk to your care team about the use of this medication in children. Special care may be needed. Overdosage: If you think you have taken too much of this medicine contact a poison control center or emergency room at once. NOTE: This medicine is only for you. Do  not share this medicine with others. What if I miss a dose? Keep appointments for follow-up doses. It is important not to miss your dose. Call your care team if you are unable to keep an appointment. What may interact with this medication? Medications for seizures Some antibiotics, such as amikacin, gentamicin, neomycin, streptomycin, tobramycin Vaccines This list may not describe all possible interactions. Give your health care provider a list of all the medicines, herbs, non-prescription drugs, or dietary supplements you use. Also tell them if you smoke, drink alcohol, or use illegal drugs. Some items may interact with your medicine. What should I watch for while using this medication? Your condition will be monitored carefully while you are receiving this medication. You may need blood work while taking this medication. This medication may make you feel generally unwell.  This is not uncommon, as chemotherapy can affect healthy cells as well as cancer cells. Report any side effects. Continue your course of treatment even though you feel ill unless your care team tells you to stop. In some cases, you may be given additional medications to help with side effects. Follow all directions for their use. This medication may increase your risk of getting an infection. Call your care team for advice if you get a fever, chills, sore throat, or other symptoms of a cold or flu. Do not treat yourself. Try to avoid being around people who are sick. Avoid taking medications that contain aspirin, acetaminophen, ibuprofen, naproxen, or ketoprofen unless instructed by your care team. These medications may hide a fever. Be careful brushing or flossing your teeth or using a toothpick because you may get an infection or bleed more easily. If you have any dental work done, tell your dentist you are receiving this medication. Talk to your care team if you wish to become pregnant or think you might be pregnant. This medication can cause serious birth defects. Talk to your care team about effective forms of contraception. Do not breast-feed while taking this medication. What side effects may I notice from receiving this medication? Side effects that you should report to your care team as soon as possible: Allergic reactions--skin rash, itching, hives, swelling of the face, lips, tongue, or throat Infection--fever, chills, cough, sore throat, wounds that don't heal, pain or trouble when passing urine, general feeling of discomfort or being unwell Low red blood cell level--unusual weakness or fatigue, dizziness, headache, trouble breathing Pain, tingling, or numbness in the hands or feet, muscle weakness, change in vision, confusion or trouble speaking, loss of balance or coordination, trouble walking, seizures Unusual bruising or bleeding Side effects that usually do not require medical  attention (report to your care team if they continue or are bothersome): Hair loss Nausea Unusual weakness or fatigue Vomiting This list may not describe all possible side effects. Call your doctor for medical advice about side effects. You may report side effects to FDA at 1-800-FDA-1088. Where should I keep my medication? This medication is given in a hospital or clinic. It will not be stored at home. NOTE: This sheet is a summary. It may not cover all possible information. If you have questions about this medicine, talk to your doctor, pharmacist, or health care provider.  2024 Elsevier/Gold Standard (2022-03-28 00:00:00)

## 2023-12-14 NOTE — Progress Notes (Signed)
Patient seen by [name] NP today  Vitals are within treatment parameters: OK to proceed w/BP 132/93  Labs are within treatment parameters:NO. OK to proceed with ALT 91 (improved)  Treatment plan has been signed: Yes   Per physician team, Patient is ready for treatment and there are NO modifications to the treatment plan.

## 2023-12-16 ENCOUNTER — Other Ambulatory Visit: Payer: Self-pay

## 2023-12-18 ENCOUNTER — Other Ambulatory Visit: Payer: Self-pay

## 2023-12-21 ENCOUNTER — Inpatient Hospital Stay: Payer: BC Managed Care – PPO | Attending: Oncology

## 2023-12-21 ENCOUNTER — Ambulatory Visit (HOSPITAL_BASED_OUTPATIENT_CLINIC_OR_DEPARTMENT_OTHER)
Admission: RE | Admit: 2023-12-21 | Discharge: 2023-12-21 | Disposition: A | Discharge status: Home / Self Care | Payer: BC Managed Care – PPO | Source: Ambulatory Visit | Attending: Nurse Practitioner | Admitting: Nurse Practitioner

## 2023-12-21 ENCOUNTER — Encounter: Payer: Self-pay | Admitting: Nurse Practitioner

## 2023-12-21 ENCOUNTER — Other Ambulatory Visit: Payer: Self-pay

## 2023-12-21 ENCOUNTER — Ambulatory Visit (HOSPITAL_BASED_OUTPATIENT_CLINIC_OR_DEPARTMENT_OTHER): Payer: BC Managed Care – PPO

## 2023-12-21 ENCOUNTER — Inpatient Hospital Stay: Payer: BC Managed Care – PPO

## 2023-12-21 ENCOUNTER — Other Ambulatory Visit (HOSPITAL_BASED_OUTPATIENT_CLINIC_OR_DEPARTMENT_OTHER): Payer: Self-pay

## 2023-12-21 ENCOUNTER — Inpatient Hospital Stay: Payer: BC Managed Care – PPO | Admitting: Nurse Practitioner

## 2023-12-21 VITALS — BP 126/93 | HR 95 | Temp 98.2°F | Resp 18 | Ht 67.0 in | Wt 173.2 lb

## 2023-12-21 VITALS — BP 132/84 | HR 94

## 2023-12-21 DIAGNOSIS — F1090 Alcohol use, unspecified, uncomplicated: Secondary | ICD-10-CM | POA: Insufficient documentation

## 2023-12-21 DIAGNOSIS — Z720 Tobacco use: Secondary | ICD-10-CM | POA: Insufficient documentation

## 2023-12-21 DIAGNOSIS — C210 Malignant neoplasm of anus, unspecified: Secondary | ICD-10-CM

## 2023-12-21 DIAGNOSIS — Z5112 Encounter for antineoplastic immunotherapy: Secondary | ICD-10-CM | POA: Diagnosis present

## 2023-12-21 DIAGNOSIS — M7989 Other specified soft tissue disorders: Secondary | ICD-10-CM

## 2023-12-21 DIAGNOSIS — Z79899 Other long term (current) drug therapy: Secondary | ICD-10-CM | POA: Diagnosis not present

## 2023-12-21 DIAGNOSIS — Z5111 Encounter for antineoplastic chemotherapy: Secondary | ICD-10-CM | POA: Diagnosis present

## 2023-12-21 DIAGNOSIS — L309 Dermatitis, unspecified: Secondary | ICD-10-CM | POA: Diagnosis not present

## 2023-12-21 LAB — CMP (CANCER CENTER ONLY)
ALT: 153 U/L — ABNORMAL HIGH (ref 0–44)
AST: 88 U/L — ABNORMAL HIGH (ref 15–41)
Albumin: 4.3 g/dL (ref 3.5–5.0)
Alkaline Phosphatase: 92 U/L (ref 38–126)
Anion gap: 8 (ref 5–15)
BUN: 12 mg/dL (ref 6–20)
CO2: 26 mmol/L (ref 22–32)
Calcium: 9.6 mg/dL (ref 8.9–10.3)
Chloride: 101 mmol/L (ref 98–111)
Creatinine: 0.58 mg/dL (ref 0.44–1.00)
GFR, Estimated: >60 mL/min (ref >60)
Glucose, Bld: 267 mg/dL — ABNORMAL HIGH (ref 70–99)
Potassium: 4.1 mmol/L (ref 3.5–5.1)
Sodium: 135 mmol/L (ref 135–145)
Total Bilirubin: 0.6 mg/dL (ref 0.0–1.2)
Total Protein: 7.1 g/dL (ref 6.5–8.1)

## 2023-12-21 LAB — CBC WITH DIFFERENTIAL (CANCER CENTER ONLY)
Abs Immature Granulocytes: 0.07 10*3/uL (ref 0.00–0.07)
Basophils Absolute: 0 10*3/uL (ref 0.0–0.1)
Basophils Relative: 1 %
Eosinophils Absolute: 0.1 10*3/uL (ref 0.0–0.5)
Eosinophils Relative: 2 %
HCT: 35.2 % — ABNORMAL LOW (ref 36.0–46.0)
Hemoglobin: 12.1 g/dL (ref 12.0–15.0)
Immature Granulocytes: 2 %
Lymphocytes Relative: 13 %
Lymphs Abs: 0.6 10*3/uL — ABNORMAL LOW (ref 0.7–4.0)
MCH: 34.4 pg — ABNORMAL HIGH (ref 26.0–34.0)
MCHC: 34.4 g/dL (ref 30.0–36.0)
MCV: 100 fL (ref 80.0–100.0)
Monocytes Absolute: 0.3 10*3/uL (ref 0.1–1.0)
Monocytes Relative: 7 %
Neutro Abs: 3.2 10*3/uL (ref 1.7–7.7)
Neutrophils Relative %: 75 %
Platelet Count: 156 10*3/uL (ref 150–400)
RBC: 3.52 MIL/uL — ABNORMAL LOW (ref 3.87–5.11)
RDW: 14.2 % (ref 11.5–15.5)
WBC Count: 4.3 10*3/uL (ref 4.0–10.5)
nRBC: 0 % (ref 0.0–0.2)

## 2023-12-21 LAB — TSH: TSH: 1.613 u[IU]/mL (ref 0.350–4.500)

## 2023-12-21 MED ORDER — SODIUM CHLORIDE 0.9 % IV SOLN
INTRAVENOUS | Status: AC
Start: 2023-12-21 — End: ?

## 2023-12-21 MED ORDER — DEXAMETHASONE SODIUM PHOSPHATE 10 MG/ML IJ SOLN
5.0000 mg | Freq: Once | INTRAMUSCULAR | Status: AC
  Administered 2023-12-21: 5 mg via INTRAVENOUS
  Filled 2023-12-21: qty 1

## 2023-12-21 MED ORDER — SODIUM CHLORIDE 0.9% FLUSH: 10.0000 mL | INTRAVENOUS | Status: AC | PRN

## 2023-12-21 MED ORDER — DIPHENHYDRAMINE HCL 50 MG/ML IJ SOLN
25.0000 mg | Freq: Once | INTRAMUSCULAR | Status: AC
Start: 2023-12-21 — End: 2023-12-21
  Administered 2023-12-21: 25 mg via INTRAVENOUS
  Filled 2023-12-21: qty 1

## 2023-12-21 MED ORDER — FAMOTIDINE IN NACL 20-0.9 MG/50ML-% IV SOLN
20.0000 mg | Freq: Once | INTRAVENOUS | Status: AC
  Administered 2023-12-21: 20 mg via INTRAVENOUS
  Filled 2023-12-21: qty 50

## 2023-12-21 MED ORDER — HEPARIN SOD (PORK) LOCK FLUSH 100 UNIT/ML IV SOLN
500.0000 [IU] | Freq: Once | INTRAVENOUS | Status: AC | PRN
Start: 2023-12-21 — End: ?

## 2023-12-21 MED ORDER — SODIUM CHLORIDE 0.9 % IV SOLN
60.0000 mg/m2 | Freq: Once | INTRAVENOUS | Status: AC
  Administered 2023-12-21: 120 mg via INTRAVENOUS
  Filled 2023-12-21: qty 20

## 2023-12-21 MED ORDER — OXYCODONE HCL 5 MG PO TABS
2.5000 mg | ORAL_TABLET | ORAL | 0 refills | Status: DC | PRN
  Filled 2023-12-21: qty 75, 13d supply, fill #0

## 2023-12-21 NOTE — Addendum Note (Signed)
 Addended by: Benita Stabile on: 12/21/2023 02:17 PM   Modules accepted: Orders

## 2023-12-21 NOTE — Progress Notes (Addendum)
 [name]   Diagnosis: Anal cancer  INTERVAL HISTORY:   [name] returns as scheduled.  She completed cycle 3-day 1 Taxol /carboplatin  plus retifanlimab  12/14/2023.  She had an episode of nausea that night.  Felt like acid.  No vomiting.  No mouth sores.  No diarrhea.  No rash.  No numbness or tingling in the hands or feet.  3 days ago she developed pain along the right neck and upper chest, same side as the Port-A-Cath.  Abdominal pain overall is better.  She takes oxycodone  if needed.  Objective:  Vital signs in last 24 hours:  Blood pressure (!) 126/93, pulse 95, temperature 98.2 F (36.8 C), temperature source Temporal, resp. rate 18, height 5' 7 (1.702 m), weight 173 lb 3.2 oz (78.6 kg), last menstrual period 03/13/2012, SpO2 100%.    HEENT: No thrush or ulcers. Resp: Lungs clear bilaterally. Cardio: Regular rate and rhythm. GI: Abdomen soft and nontender.  No mass.  No hepatosplenomegaly. Vascular:  Right arm appears slightly larger than the left arm.  Tender along the right neck and right upper chest with associated tenderness, superior to the Port-A-Cath infusion device.  No erythema. Skin: No rash.   Lab Results:  Lab Results  Component Value Date   WBC 4.3 12/21/2023   HGB 12.1 12/21/2023   HCT 35.2 (L) 12/21/2023   MCV 100.0 12/21/2023   PLT 156 12/21/2023   NEUTROABS 3.2 12/21/2023    Imaging:  No results found.  Medications: I have reviewed the patient's current medications.  Assessment/Plan: Squamous cell carcinoma of the anal canal Colonoscopy 01/28/2021-rectal mass extending to the outside, palpable on exam-biopsy revealed high-grade squamous intraepithelial lesion (AIN 3/carcinoma in situ) Exam under anesthesia with transanal excision of an anal canal mass 02/25/2021-frozen section consistent with squamous cell carcinoma, 4 x 4 centimeter sessile anal canal mass extending to the anal verge, right lateral and  posterior, 30% circumferential, fixed to the anal sphincter complex; anal rectal mass with mixed high-grade neuroendocrine squamous cell carcinoma; posterior anal tag with squamous cell carcinoma; left labial lesion-condyloma acuminata CTs 03/07/2021-ill-definition of tissue planes along the anus with abnormal soft tissue prominence posteriorly along the anal canal suspicious for mass.  Small adjacent lower perirectal lymph nodes in addition to a 0.5 cm sacral node.  Focal enhancing lesion in the right hepatic lobe 1.2 x 0.8 x 0.9 cm.  2.1 x 1.5 cm cystic lesion right ovary with thin enhancing rim. PET scan 03/16/2021-hypermetabolic mass within the anal canal.  No definite evidence of local or distant metastatic disease.  7 mm left submandibular lymph node with mild FDG uptake, likely reactive.  Focal site of FDG uptake within the inner quadrant of the right breast. Radiation 03/21/2021-04/27/2021 Cycle one 5-FU/mitomycin -C 03/21/2021 Cycle two 5-FU/Mitomycin -C 04/18/2021, 5-FU dose reduced secondary to mucositis Anoscopy by Dr. Sheldon 07/20/2021-divet in the right posterior lateral aspect with some right anterior and right lateral folds consistent with radiation treatment.  No ulceration.  Not fully resolved but markedly shrunk down. Anorectal mass 09/30/2021 examination under anesthesia, biopsy-poorly differentiated carcinoma-basaloid squamous carcinoma and high-grade neuroendocrine carcinoma, margins positive APR/and vertical rectus abdominis flap 11/03/2021, invasive poorly differentiated carcinoma, negative resection margins, 0/10 nodes, treatment response score 2,ypT1,ypN0 CT abdomen/pelvis 12/03/2021-high-grade small bowel obstruction transitioning in the left lower abdomen.  Presacral rim-enhancing 4.4 x 2.8 x 3.8 cm fluid collection.  No air in the collection.  Prior study described a potential abnormality in the right lobe of the liver, not seen  on current study.  Cystic lesion of the right ovary  resolved. CT chest 12/07/2021-interval development of bilateral trace pleural effusions. CTs 12/04/2022-new 2.6 x 2.5 cm right lower lobe nodule PET 12/21/2022-FDG avid right lower lobe nodule, no other evidence of metastatic disease 12/29/2022 right lower lobectomy, lymph node dissection-poorly differentiated carcinoma consistent with metastasis, 4.8 x 2.9 x 2.6 cm, margins free, 7 lymph nodes negative for carcinoma; cytohistomorphology correlation supports the current tumor being a metastasis from the previously diagnosed anal carcinoma CT chest 04/17/2023-interval right lower lobectomy with resection of right lung mass, pleural thickening and small right pleural effusion, no new mass or lymph node enlargement CTs 07/20/2023-diminished postoperative right pleural effusion.  Unchanged postoperative findings status post abdominoperineal resection with left lower quadrant and colostomy.  No evidence of metastatic disease. CT abdomen/pelvis 08/31/2023-heterogenous soft tissue nodule in the right upper quadrant anterior abdominal wall, no evidence of metastatic disease CT/ultrasound guided biopsy of right abdominal wall mass 09/07/2023-metastatic neoplasm consistent with a metastasis from anal cancer, Foundation 1-HRD signature negative, MSS, tumor mutation burden 4, BRCA 1 alteration, PD-L1 TPS 10% Cycle 1 day 1 Taxol //carboplatin  10/19/2023, day 8 Taxol  10/26/2023, day 15 Taxol  held 11/02/2023 due to neutropenia Cycle 2-day 1 Taxol /carboplatin  plus Retifanlimab  11/14/2023, Taxol  and carboplatin  dose reduced due to previous neutropenia, mild thrombocytopenia; day 8 Taxol  11/22/2023, day 15 Taxol  11/29/2023 Cycle 3-day 1 Taxol /carboplatin  plus retifanlimab  12/14/2023, day 8 Taxol  12/21/2023   Remote history of an anal fissure repair Tobacco use Moderate alcohol use Eczema Right breast mass- PET scan 03/16/2021 with focal FDG activity in the inner right breast Mammogram and right breast ultrasound 03/28/2021-  negative Bilateral breast MRI 03/31/2021- 7 mm mass in the upper inner right breast correlating with the PET findings, no other abnormality, no adenopathy MRI guided biopsy of right breast mass 04/11/2021- fibrocystic change with sclerosing adenosis, small focus of inflammation/fibrosis suggestive of resolving fat necrosis, no evidence of malignancy--repeat bilateral breast MRI at a 59-month interval; bilateral breast MRI on 10/13/2021-no evidence of breast malignancy.  Annual screening mammography recommended.   7.  Admission with small bowel obstruction 12/03/2021 status post exploratory laparotomy and lysis of adhesions 8.  Right abdominal pain/tenderness-potentially related to the right abdominal wall mass noted on CT 08/31/2023 9.  Elevated liver enzymes-chronic, etiology unclear 10.  Hematuria-evaluated by urology 09/11/2023, cystoscopy with radiation and infection changes, no tumor seen 11.  Right neck/chest tenderness-referred for venous Doppler study to rule out DVT.    Disposition: [name] appears stable.  She completed cycle 2-day 1 Taxol /carboplatin  plus retifanlimab  12/14/2023.  She had a single episode of nausea, otherwise tolerated well.  Plan to proceed with day 8 Taxol  today as scheduled.  CBC and chemistry panel reviewed.  Labs adequate to proceed as above.  She developed recent pain/tenderness and fullness along the right neck/chest.  Same side as the Port-A-Cath.  Plan for venous Doppler ultrasound today to rule out DVT.  We discussed anticoagulation if she is found to have a DVT.  She agrees with this plan.  She will return for follow-up and day 15 Taxol  next week.  Olam Ned ANP/GNP-BC   12/21/2023  11:47 AM  Addendum 4:49 PM--venous doppler study negative for DVT. I reviewed the result with [name]. Plan to monitor for now. She understands to seek evaluation over the weekend with increased pain/swelling, or if she develops signs of infection such as fever or redness.  We will contact her on Monday to follow-up.

## 2023-12-21 NOTE — Progress Notes (Signed)
 Patient seen by Olam Ned NP today  Vitals are within treatment parameters:No (Please specify and give further instructions.) B/P 127/93 Labs are within treatment parameters: No (Please specify and give further instructions.)  AST 88 ALT 153  Treatment plan has been signed: Yes   Per physician team, Patient is ready for treatment and there are NO modifications to the treatment plan.  Please do not use the patient port

## 2023-12-24 ENCOUNTER — Telehealth: Payer: Self-pay

## 2023-12-24 NOTE — Telephone Encounter (Signed)
-----   Message from [name] sent at 12/21/2023  4:51 PM EST ----- Please call her on Monday 1/6 to f/u on right neck pain and swelling.

## 2023-12-24 NOTE — Telephone Encounter (Signed)
 Patient states she is not in pain and the swelling is there any more. I let the patient know to call if any other concerns come up.

## 2023-12-25 ENCOUNTER — Other Ambulatory Visit: Payer: Self-pay | Admitting: Nurse Practitioner

## 2023-12-25 DIAGNOSIS — C210 Malignant neoplasm of anus, unspecified: Secondary | ICD-10-CM

## 2023-12-28 ENCOUNTER — Inpatient Hospital Stay: Payer: BC Managed Care – PPO

## 2023-12-28 ENCOUNTER — Inpatient Hospital Stay: Payer: BC Managed Care – PPO | Admitting: Oncology

## 2023-12-28 VITALS — BP 133/93 | HR 91 | Resp 18

## 2023-12-28 VITALS — BP 136/93 | HR 100 | Temp 97.9°F | Resp 18 | Ht 67.0 in | Wt 175.3 lb

## 2023-12-28 DIAGNOSIS — C210 Malignant neoplasm of anus, unspecified: Secondary | ICD-10-CM

## 2023-12-28 LAB — CMP (CANCER CENTER ONLY)
ALT: 89 U/L — ABNORMAL HIGH (ref 0–44)
AST: 32 U/L (ref 15–41)
Albumin: 4.3 g/dL (ref 3.5–5.0)
Alkaline Phosphatase: 93 U/L (ref 38–126)
Anion gap: 8 (ref 5–15)
BUN: 10 mg/dL (ref 6–20)
CO2: 26 mmol/L (ref 22–32)
Calcium: 9.3 mg/dL (ref 8.9–10.3)
Chloride: 102 mmol/L (ref 98–111)
Creatinine: 0.73 mg/dL (ref 0.44–1.00)
GFR, Estimated: >60 mL/min (ref >60)
Glucose, Bld: 315 mg/dL — ABNORMAL HIGH (ref 70–99)
Potassium: 4.1 mmol/L (ref 3.5–5.1)
Sodium: 136 mmol/L (ref 135–145)
Total Bilirubin: 0.7 mg/dL (ref 0.0–1.2)
Total Protein: 6.8 g/dL (ref 6.5–8.1)

## 2023-12-28 LAB — CBC WITH DIFFERENTIAL (CANCER CENTER ONLY)
Abs Immature Granulocytes: 0.02 10*3/uL (ref 0.00–0.07)
Basophils Absolute: 0 10*3/uL (ref 0.0–0.1)
Basophils Relative: 1 %
Eosinophils Absolute: 0.1 10*3/uL (ref 0.0–0.5)
Eosinophils Relative: 2 %
HCT: 34.3 % — ABNORMAL LOW (ref 36.0–46.0)
Hemoglobin: 11.9 g/dL — ABNORMAL LOW (ref 12.0–15.0)
Immature Granulocytes: 1 %
Lymphocytes Relative: 20 %
Lymphs Abs: 0.4 10*3/uL — ABNORMAL LOW (ref 0.7–4.0)
MCH: 34.4 pg — ABNORMAL HIGH (ref 26.0–34.0)
MCHC: 34.7 g/dL (ref 30.0–36.0)
MCV: 99.1 fL (ref 80.0–100.0)
Monocytes Absolute: 0.3 10*3/uL (ref 0.1–1.0)
Monocytes Relative: 12 %
Neutro Abs: 1.4 10*3/uL — ABNORMAL LOW (ref 1.7–7.7)
Neutrophils Relative %: 64 %
Platelet Count: 151 10*3/uL (ref 150–400)
RBC: 3.46 MIL/uL — ABNORMAL LOW (ref 3.87–5.11)
RDW: 14.3 % (ref 11.5–15.5)
WBC Count: 2.2 10*3/uL — ABNORMAL LOW (ref 4.0–10.5)
nRBC: 0 % (ref 0.0–0.2)

## 2023-12-28 MED ORDER — DEXAMETHASONE SODIUM PHOSPHATE 10 MG/ML IJ SOLN
5.0000 mg | Freq: Once | INTRAMUSCULAR | Status: AC
  Administered 2023-12-28: 5 mg via INTRAVENOUS
  Filled 2023-12-28: qty 1

## 2023-12-28 MED ORDER — SODIUM CHLORIDE 0.9 % IV SOLN: INTRAVENOUS | Status: DC

## 2023-12-28 MED ORDER — FAMOTIDINE IN NACL 20-0.9 MG/50ML-% IV SOLN
20.0000 mg | Freq: Once | INTRAVENOUS | Status: AC
Start: 2023-12-28 — End: 2023-12-28
  Administered 2023-12-28: 20 mg via INTRAVENOUS
  Filled 2023-12-28: qty 50

## 2023-12-28 MED ORDER — HEPARIN SOD (PORK) LOCK FLUSH 100 UNIT/ML IV SOLN
500.0000 [IU] | Freq: Once | INTRAVENOUS | Status: AC | PRN
  Administered 2023-12-28: 500 [IU]

## 2023-12-28 MED ORDER — DIPHENHYDRAMINE HCL 50 MG/ML IJ SOLN
25.0000 mg | Freq: Once | INTRAMUSCULAR | Status: AC
Start: 2023-12-28 — End: 2023-12-28
  Administered 2023-12-28: 25 mg via INTRAVENOUS
  Filled 2023-12-28: qty 1

## 2023-12-28 MED ORDER — SODIUM CHLORIDE 0.9 % IV SOLN
60.0000 mg/m2 | Freq: Once | INTRAVENOUS | Status: AC
  Administered 2023-12-28: 120 mg via INTRAVENOUS
  Filled 2023-12-28: qty 20

## 2023-12-28 MED ORDER — SODIUM CHLORIDE 0.9% FLUSH
10.0000 mL | INTRAVENOUS | Status: DC | PRN
  Administered 2023-12-28: 10 mL

## 2023-12-28 NOTE — Patient Instructions (Signed)
 [facility] - A DEPT OF [name]   Discharge Instructions: Thank you for choosing [name] to provide your oncology and hematology care.   If you have a lab appointment with the [name], please go directly to the [name] and check in at the registration area.   Wear comfortable clothing and clothing appropriate for easy access to any Portacath or PICC line.   We strive to give you quality time with your provider. You may need to reschedule your appointment if you arrive late (15 or more minutes).  Arriving late affects you and other patients whose appointments are after yours.  Also, if you miss three or more appointments without notifying the office, you may be dismissed from the clinic at the provider's discretion.      For prescription refill requests, have your pharmacy contact our office and allow 72 hours for refills to be completed.    Today you received the following chemotherapy and/or immunotherapy agents Paclitaxel (TAXOL).      To help prevent nausea and vomiting after your treatment, we encourage you to take your nausea medication as directed.  BELOW ARE SYMPTOMS THAT SHOULD BE REPORTED IMMEDIATELY: *FEVER GREATER THAN 100.4 F (38 C) OR HIGHER *CHILLS OR SWEATING *NAUSEA AND VOMITING THAT IS NOT CONTROLLED WITH YOUR NAUSEA MEDICATION *UNUSUAL SHORTNESS OF BREATH *UNUSUAL BRUISING OR BLEEDING *URINARY PROBLEMS (pain or burning when urinating, or frequent urination) *BOWEL PROBLEMS (unusual diarrhea, constipation, pain near the anus) TENDERNESS IN MOUTH AND THROAT WITH OR WITHOUT PRESENCE OF ULCERS (sore throat, sores in mouth, or a toothache) UNUSUAL RASH, SWELLING OR PAIN  UNUSUAL VAGINAL DISCHARGE OR ITCHING   Items with * indicate a potential emergency and should be followed up as soon as possible or go to the Emergency Department if any problems should occur.  Please show the CHEMOTHERAPY ALERT CARD or  IMMUNOTHERAPY ALERT CARD at check-in to the Emergency Department and triage nurse.  Should you have questions after your visit or need to cancel or reschedule your appointment, please contact [name]  Dept: [phone]  and follow the prompts.  Office hours are 8:00 a.m. to 4:30 p.m. Monday - Friday. Please note that voicemails left after 4:00 p.m. may not be returned until the following business day.  We are closed weekends and major holidays. You have access to a nurse at all times for urgent questions. Please call the main number to the clinic Dept: [phone] and follow the prompts.   For any non-urgent questions, you may also contact your provider using MyChart. We now offer e-Visits for anyone 67 and older to request care online for non-urgent symptoms. For details visit [email or web address].   Also download the MyChart app! Go to the app store, search "MyChart", open the app, select [facility], and log in with your MyChart username and password.  Paclitaxel Injection What is this medication? PACLITAXEL (PAK li TAX el) treats some types of cancer. It works by slowing down the growth of cancer cells. This medicine may be used for other purposes; ask your health care provider or pharmacist if you have questions. COMMON BRAND NAME(S): Onxol, Taxol What should I tell my care team before I take this medication? They need to know if you have any of these conditions: Heart disease Liver disease Low white blood cell levels An unusual or allergic reaction to paclitaxel, other medications, foods, dyes, or  preservatives If you or your partner are pregnant or trying to get pregnant Breast-feeding How should I use this medication? This medication is injected into a vein. It is given by your care team in a hospital or clinic setting. Talk to your care team about the use of this medication in children. While it may be given to children  for selected conditions, precautions do apply. Overdosage: If you think you have taken too much of this medicine contact a poison control [name] or emergency room at once. NOTE: This medicine is only for you. Do not share this medicine with others. What if I miss a dose? Keep appointments for follow-up doses. It is important not to miss your dose. Call your care team if you are unable to keep an appointment. What may interact with this medication? Do not take this medication with any of the following: Live virus vaccines Other medications may affect the way this medication works. Talk with your care team about all of the medications you take. They may suggest changes to your treatment plan to lower the risk of side effects and to make sure your medications work as intended. This list may not describe all possible interactions. Give your health care provider a list of all the medicines, herbs, non-prescription drugs, or dietary supplements you use. Also tell them if you smoke, drink alcohol, or use illegal drugs. Some items may interact with your medicine. What should I watch for while using this medication? Your condition will be monitored carefully while you are receiving this medication. You may need blood work while taking this medication. This medication may make you feel generally unwell. This is not uncommon as chemotherapy can affect healthy cells as well as cancer cells. Report any side effects. Continue your course of treatment even though you feel ill unless your care team tells you to stop. This medication can cause serious allergic reactions. To reduce the risk, your care team may give you other medications to take before receiving this one. Be sure to follow the directions from your care team. This medication may increase your risk of getting an infection. Call your care team for advice if you get a fever, chills, sore throat, or other symptoms of a cold or flu. Do not treat yourself. Try  to avoid being around people who are sick. This medication may increase your risk to bruise or bleed. Call your care team if you notice any unusual bleeding. Be careful brushing or flossing your teeth or using a toothpick because you may get an infection or bleed more easily. If you have any dental work done, tell your dentist you are receiving this medication. Talk to your care team if you may be pregnant. Serious birth defects can occur if you take this medication during pregnancy. Talk to your care team before breastfeeding. Changes to your treatment plan may be needed. What side effects may I notice from receiving this medication? Side effects that you should report to your care team as soon as possible: Allergic reactions--skin rash, itching, hives, swelling of the face, lips, tongue, or throat Heart rhythm changes--fast or irregular heartbeat, dizziness, feeling faint or lightheaded, chest pain, trouble breathing Increase in blood pressure Infection--fever, chills, cough, sore throat, wounds that don't heal, pain or trouble when passing urine, general feeling of discomfort or being unwell Low blood pressure--dizziness, feeling faint or lightheaded, blurry vision Low red blood cell level--unusual weakness or fatigue, dizziness, headache, trouble breathing Painful swelling, warmth, or redness of the skin,  blisters or sores at the infusion site Pain, tingling, or numbness in the hands or feet Slow heartbeat--dizziness, feeling faint or lightheaded, confusion, trouble breathing, unusual weakness or fatigue Unusual bruising or bleeding Side effects that usually do not require medical attention (report to your care team if they continue or are bothersome): Diarrhea Hair loss Joint pain Loss of appetite Muscle pain Nausea Vomiting This list may not describe all possible side effects. Call your doctor for medical advice about side effects. You may report side effects to FDA at  1-800-FDA-1088. Where should I keep my medication? This medication is given in a hospital or clinic. It will not be stored at home. NOTE: This sheet is a summary. It may not cover all possible information. If you have questions about this medicine, talk to your doctor, pharmacist, or health care provider.  2024 Elsevier/Gold Standard (2022-04-25 00:00:00)

## 2023-12-28 NOTE — Progress Notes (Signed)
 Patient seen by Dr. Arley Hof today  Vitals are within treatment parameters: BP  136/93--OK to proceed  Labs are within treatment parameters: No (Please specify and give further instructions.)  ANC 1.4 and ALT 89--OK to proceed Treatment plan has been signed: Yes   Per physician team, Patient is ready for treatment and there are NO modifications to the treatment plan.

## 2023-12-28 NOTE — Progress Notes (Signed)
 [facility] OFFICE PROGRESS NOTE   Diagnosis: Anal cancer  INTERVAL HISTORY:   Beth [name] completed day 8 treatment with paclitaxel  on 12/21/2023.  No sign of allergic reaction.  No neuropathy symptoms.  Abdominal pain has improved. She had tenderness and fullness in the right neck when she was seen last week.  Doppler was negative.  She reports the discomfort has resolved. Objective:  Vital signs in last 24 hours:  Blood pressure (!) 136/93, pulse 100, temperature 97.9 F (36.6 C), temperature source Temporal, resp. rate 18, height 5' 7 (1.702 m), weight 175 lb 4.8 oz (79.5 kg), last menstrual period 03/13/2012, SpO2 100%.    HEENT: No thrush, no right neck swelling or tenderness Resp: Lungs clear bilaterally Cardio: Regular rate and rhythm GI: Nontender, no mass, no hepatosplenomegaly, left lower quadrant colostomy Vascular: No leg edema  Portacath/PICC-without erythema  Lab Results:  Lab Results  Component Value Date   WBC 2.2 (L) 12/28/2023   HGB 11.9 (L) 12/28/2023   HCT 34.3 (L) 12/28/2023   MCV 99.1 12/28/2023   PLT 151 12/28/2023   NEUTROABS 1.4 (L) 12/28/2023    CMP  Lab Results  Component Value Date   NA 136 12/28/2023   K 4.1 12/28/2023   CL 102 12/28/2023   CO2 26 12/28/2023   GLUCOSE 315 (H) 12/28/2023   BUN 10 12/28/2023   CREATININE 0.73 12/28/2023   CALCIUM  9.3 12/28/2023   PROT 6.8 12/28/2023   ALBUMIN  4.3 12/28/2023   AST 32 12/28/2023   ALT 89 (H) 12/28/2023   ALKPHOS 93 12/28/2023   BILITOT 0.7 12/28/2023   GFRNONAA >60 12/28/2023   GFRAA 106 11/29/2020     Medications: I have reviewed the patient's current medications.   Assessment/Plan: Squamous cell carcinoma of the anal canal Colonoscopy 01/28/2021-rectal mass extending to the outside, palpable on exam-biopsy revealed high-grade squamous intraepithelial lesion (AIN 3/carcinoma in situ) Exam under anesthesia with transanal excision of an anal canal mass  02/25/2021-frozen section consistent with squamous cell carcinoma, 4 x 4 centimeter sessile anal canal mass extending to the anal verge, right lateral and posterior, 30% circumferential, fixed to the anal sphincter complex; anal rectal mass with mixed high-grade neuroendocrine squamous cell carcinoma; posterior anal tag with squamous cell carcinoma; left labial lesion-condyloma acuminata CTs 03/07/2021-ill-definition of tissue planes along the anus with abnormal soft tissue prominence posteriorly along the anal canal suspicious for mass.  Small adjacent lower perirectal lymph nodes in addition to a 0.5 cm sacral node.  Focal enhancing lesion in the right hepatic lobe 1.2 x 0.8 x 0.9 cm.  2.1 x 1.5 cm cystic lesion right ovary with thin enhancing rim. PET scan 03/16/2021-hypermetabolic mass within the anal canal.  No definite evidence of local or distant metastatic disease.  7 mm left submandibular lymph node with mild FDG uptake, likely reactive.  Focal site of FDG uptake within the inner quadrant of the right breast. Radiation 03/21/2021-04/27/2021 Cycle one 5-FU/mitomycin -C 03/21/2021 Cycle two 5-FU/Mitomycin -C 04/18/2021, 5-FU dose reduced secondary to mucositis Anoscopy by Dr. Sheldon 07/20/2021-divet in the right posterior lateral aspect with some right anterior and right lateral folds consistent with radiation treatment.  No ulceration.  Not fully resolved but markedly shrunk down. Anorectal mass 09/30/2021 examination under anesthesia, biopsy-poorly differentiated carcinoma-basaloid squamous carcinoma and high-grade neuroendocrine carcinoma, margins positive APR/and vertical rectus abdominis flap 11/03/2021, invasive poorly differentiated carcinoma, negative resection margins, 0/10 nodes, treatment response score 2,ypT1,ypN0 CT abdomen/pelvis 12/03/2021-high-grade small bowel obstruction transitioning in the left lower abdomen.  Presacral rim-enhancing 4.4 x 2.8 x 3.8 cm fluid collection.  No air in the  collection.  Prior study described a potential abnormality in the right lobe of the liver, not seen on current study.  Cystic lesion of the right ovary resolved. CT chest 12/07/2021-interval development of bilateral trace pleural effusions. CTs 12/04/2022-new 2.6 x 2.5 cm right lower lobe nodule PET 12/21/2022-FDG avid right lower lobe nodule, no other evidence of metastatic disease 12/29/2022 right lower lobectomy, lymph node dissection-poorly differentiated carcinoma consistent with metastasis, 4.8 x 2.9 x 2.6 cm, margins free, 7 lymph nodes negative for carcinoma; cytohistomorphology correlation supports the current tumor being a metastasis from the previously diagnosed anal carcinoma CT chest 04/17/2023-interval right lower lobectomy with resection of right lung mass, pleural thickening and small right pleural effusion, no new mass or lymph node enlargement CTs 07/20/2023-diminished postoperative right pleural effusion.  Unchanged postoperative findings status post abdominoperineal resection with left lower quadrant and colostomy.  No evidence of metastatic disease. CT abdomen/pelvis 08/31/2023-heterogenous soft tissue nodule in the right upper quadrant anterior abdominal wall, no evidence of metastatic disease CT/ultrasound guided biopsy of right abdominal wall mass 09/07/2023-metastatic neoplasm consistent with a metastasis from anal cancer, Foundation 1-HRD signature negative, MSS, tumor mutation burden 4, BRCA 1 alteration, PD-L1 TPS 10% Cycle 1 day 1 Taxol //carboplatin  10/19/2023, day 8 Taxol  10/26/2023, day 15 Taxol  held 11/02/2023 due to neutropenia Cycle 2-day 1 Taxol /carboplatin  plus Retifanlimab  11/14/2023, Taxol  and carboplatin  dose reduced due to previous neutropenia, mild thrombocytopenia; day 8 Taxol  11/22/2023, day 15 Taxol  11/29/2023 Cycle 3-day 1 Taxol /carboplatin  plus retifanlimab  12/14/2023, day 8 Taxol  12/21/2023, day 15 Taxol  12/28/2023   Remote history of an anal fissure repair Tobacco  use Moderate alcohol use Eczema Right breast mass- PET scan 03/16/2021 with focal FDG activity in the inner right breast Mammogram and right breast ultrasound 03/28/2021- negative Bilateral breast MRI 03/31/2021- 7 mm mass in the upper inner right breast correlating with the PET findings, no other abnormality, no adenopathy MRI guided biopsy of right breast mass 04/11/2021- fibrocystic change with sclerosing adenosis, small focus of inflammation/fibrosis suggestive of resolving fat necrosis, no evidence of malignancy--repeat bilateral breast MRI at a 65-month interval; bilateral breast MRI on 10/13/2021-no evidence of breast malignancy.  Annual screening mammography recommended.   7.  Admission with small bowel obstruction 12/03/2021 status post exploratory laparotomy and lysis of adhesions 8.  Right abdominal pain/tenderness-potentially related to the right abdominal wall mass noted on CT 08/31/2023 9.  Elevated liver enzymes-chronic, etiology unclear 10.  Hematuria-evaluated by urology 09/11/2023, cystoscopy with radiation and infection changes, no tumor seen 11.  Right neck/chest tenderness-referred for venous Doppler study to rule out DVT.     Disposition: Beth [name] appears stable.  She will complete day 15 of cycle 3 chemotherapy today.  She has mild neutropenia.  She will call for a fever.  She will undergo a restaging CT next week.  She will return for an office visit in 2 weeks.  She will avoid concentrated sweets for the next few days with the elevated blood sugar.  She says she can adjust the insulin  dose if the blood sugar is more elevated.  Arley Hof, MD  12/28/2023  9:11 AM

## 2024-01-04 ENCOUNTER — Ambulatory Visit (HOSPITAL_BASED_OUTPATIENT_CLINIC_OR_DEPARTMENT_OTHER): Payer: BC Managed Care – PPO

## 2024-01-04 ENCOUNTER — Encounter: Payer: Self-pay | Admitting: *Deleted

## 2024-01-04 ENCOUNTER — Inpatient Hospital Stay: Payer: BC Managed Care – PPO

## 2024-01-04 ENCOUNTER — Other Ambulatory Visit: Payer: Self-pay | Admitting: *Deleted

## 2024-01-04 DIAGNOSIS — C210 Malignant neoplasm of anus, unspecified: Secondary | ICD-10-CM

## 2024-01-06 ENCOUNTER — Other Ambulatory Visit: Payer: Self-pay | Admitting: Oncology

## 2024-01-09 ENCOUNTER — Ambulatory Visit: Payer: BC Managed Care – PPO

## 2024-01-09 DIAGNOSIS — C210 Malignant neoplasm of anus, unspecified: Secondary | ICD-10-CM | POA: Diagnosis not present

## 2024-01-09 MED ORDER — IOHEXOL 9 MG/ML PO SOLN
500.0000 mL | ORAL | Status: AC
  Administered 2024-01-09 (×2): 500 mL via ORAL

## 2024-01-09 MED ORDER — IOHEXOL 300 MG/ML  SOLN
100.0000 mL | Freq: Once | INTRAMUSCULAR | Status: AC | PRN
  Administered 2024-01-09: 100 mL via INTRAVENOUS

## 2024-01-11 ENCOUNTER — Inpatient Hospital Stay: Payer: BC Managed Care – PPO | Admitting: Oncology

## 2024-01-11 ENCOUNTER — Inpatient Hospital Stay: Payer: BC Managed Care – PPO

## 2024-01-11 VITALS — BP 139/91 | HR 100 | Temp 97.8°F | Resp 18 | Ht 67.0 in | Wt 176.3 lb

## 2024-01-11 VITALS — BP 121/95 | HR 93

## 2024-01-11 DIAGNOSIS — C210 Malignant neoplasm of anus, unspecified: Secondary | ICD-10-CM

## 2024-01-11 LAB — CBC WITH DIFFERENTIAL (CANCER CENTER ONLY)
Abs Immature Granulocytes: 0.02 10*3/uL (ref 0.00–0.07)
Basophils Absolute: 0 10*3/uL (ref 0.0–0.1)
Basophils Relative: 0 %
Eosinophils Absolute: 0.1 10*3/uL (ref 0.0–0.5)
Eosinophils Relative: 1 %
HCT: 36.7 % (ref 36.0–46.0)
Hemoglobin: 12.3 g/dL (ref 12.0–15.0)
Immature Granulocytes: 0 %
Lymphocytes Relative: 14 %
Lymphs Abs: 0.6 10*3/uL — ABNORMAL LOW (ref 0.7–4.0)
MCH: 34.3 pg — ABNORMAL HIGH (ref 26.0–34.0)
MCHC: 33.5 g/dL (ref 30.0–36.0)
MCV: 102.2 fL — ABNORMAL HIGH (ref 80.0–100.0)
Monocytes Absolute: 0.7 10*3/uL (ref 0.1–1.0)
Monocytes Relative: 14 %
Neutro Abs: 3.2 10*3/uL (ref 1.7–7.7)
Neutrophils Relative %: 71 %
Platelet Count: 127 10*3/uL — ABNORMAL LOW (ref 150–400)
RBC: 3.59 MIL/uL — ABNORMAL LOW (ref 3.87–5.11)
RDW: 15.2 % (ref 11.5–15.5)
WBC Count: 4.6 10*3/uL (ref 4.0–10.5)
nRBC: 0 % (ref 0.0–0.2)

## 2024-01-11 LAB — CMP (CANCER CENTER ONLY)
ALT: 90 U/L — ABNORMAL HIGH (ref 0–44)
AST: 52 U/L — ABNORMAL HIGH (ref 15–41)
Albumin: 4.5 g/dL (ref 3.5–5.0)
Alkaline Phosphatase: 90 U/L (ref 38–126)
Anion gap: 8 (ref 5–15)
BUN: 11 mg/dL (ref 6–20)
CO2: 25 mmol/L (ref 22–32)
Calcium: 9.1 mg/dL (ref 8.9–10.3)
Chloride: 100 mmol/L (ref 98–111)
Creatinine: 0.72 mg/dL (ref 0.44–1.00)
GFR, Estimated: >60 mL/min (ref >60)
Glucose, Bld: 290 mg/dL — ABNORMAL HIGH (ref 70–99)
Potassium: 4 mmol/L (ref 3.5–5.1)
Sodium: 133 mmol/L — ABNORMAL LOW (ref 135–145)
Total Bilirubin: 0.8 mg/dL (ref 0.0–1.2)
Total Protein: 6.9 g/dL (ref 6.5–8.1)

## 2024-01-11 MED ORDER — SODIUM CHLORIDE 0.9 % IV SOLN: INTRAVENOUS | Status: DC

## 2024-01-11 MED ORDER — SODIUM CHLORIDE 0.9% FLUSH
10.0000 mL | INTRAVENOUS | Status: DC | PRN
Start: 2024-01-11 — End: 2024-01-11
  Administered 2024-01-11: 10 mL

## 2024-01-11 MED ORDER — DEXAMETHASONE SODIUM PHOSPHATE 10 MG/ML IJ SOLN
5.0000 mg | Freq: Once | INTRAMUSCULAR | Status: AC
  Administered 2024-01-11: 5 mg via INTRAVENOUS
  Filled 2024-01-11: qty 1

## 2024-01-11 MED ORDER — HEPARIN SOD (PORK) LOCK FLUSH 100 UNIT/ML IV SOLN
500.0000 [IU] | Freq: Once | INTRAVENOUS | Status: AC | PRN
  Administered 2024-01-11: 500 [IU]

## 2024-01-11 MED ORDER — DIPHENHYDRAMINE HCL 50 MG/ML IJ SOLN
25.0000 mg | Freq: Once | INTRAMUSCULAR | Status: AC
  Administered 2024-01-11: 25 mg via INTRAVENOUS
  Filled 2024-01-11: qty 1

## 2024-01-11 MED ORDER — SODIUM CHLORIDE 0.9 % IV SOLN
384.0000 mg | Freq: Once | INTRAVENOUS | Status: AC
  Administered 2024-01-11: 380 mg via INTRAVENOUS
  Filled 2024-01-11: qty 38

## 2024-01-11 MED ORDER — RETIFANLIMAB-DLWR CHEMO 500MG/20ML IV SOLN
500.0000 mg | Freq: Once | INTRAVENOUS | Status: AC
  Administered 2024-01-11: 500 mg via INTRAVENOUS
  Filled 2024-01-11: qty 20

## 2024-01-11 MED ORDER — PALONOSETRON HCL INJECTION 0.25 MG/5ML
0.2500 mg | Freq: Once | INTRAVENOUS | Status: AC
  Administered 2024-01-11: 0.25 mg via INTRAVENOUS
  Filled 2024-01-11: qty 5

## 2024-01-11 MED ORDER — FAMOTIDINE IN NACL 20-0.9 MG/50ML-% IV SOLN
20.0000 mg | Freq: Once | INTRAVENOUS | Status: AC
  Administered 2024-01-11: 20 mg via INTRAVENOUS
  Filled 2024-01-11: qty 50

## 2024-01-11 MED ORDER — SODIUM CHLORIDE 0.9 % IV SOLN
60.0000 mg/m2 | Freq: Once | INTRAVENOUS | Status: AC
Start: 2024-01-11 — End: 2024-01-11
  Administered 2024-01-11: 120 mg via INTRAVENOUS
  Filled 2024-01-11: qty 20

## 2024-01-11 MED ORDER — SODIUM CHLORIDE 0.9 % IV SOLN
150.0000 mg | Freq: Once | INTRAVENOUS | Status: AC
  Administered 2024-01-11: 150 mg via INTRAVENOUS
  Filled 2024-01-11: qty 150

## 2024-01-11 NOTE — Progress Notes (Signed)
Patient seen by Dr. Thornton Papas today  Vitals are within treatment parameters:Yes   Labs are within treatment parameters: No (Please specify and give further instructions.)  ALT 92--OK to proceed  Treatment plan has been signed: Yes   Per physician team, Patient is ready for treatment and there are NO modifications to the treatment plan.

## 2024-01-11 NOTE — Patient Instructions (Addendum)
[facility] - A DEPT OF [facility]   Discharge Instructions: Thank you for choosing [facility] to provide your oncology and hematology care.   If you have a lab appointment with the Cancer Center, please go directly to the Cancer Center and check in at the registration area.   Wear comfortable clothing and clothing appropriate for easy access to any Portacath or PICC line.   We strive to give you quality time with your provider. You may need to reschedule your appointment if you arrive late (15 or more minutes).  Arriving late affects you and other patients whose appointments are after yours.  Also, if you miss three or more appointments without notifying the office, you may be dismissed from the clinic at the provider's discretion.      For prescription refill requests, have your pharmacy contact our office and allow 72 hours for refills to be completed.    Today you received the following chemotherapy and/or immunotherapy agents ZYNZ/TAXOL/CARBOPLATIN      To help prevent nausea and vomiting after your treatment, we encourage you to take your nausea medication as directed.  BELOW ARE SYMPTOMS THAT SHOULD BE REPORTED IMMEDIATELY: *FEVER GREATER THAN 100.4 F (38 C) OR HIGHER *CHILLS OR SWEATING *NAUSEA AND VOMITING THAT IS NOT CONTROLLED WITH YOUR NAUSEA MEDICATION *UNUSUAL SHORTNESS OF BREATH *UNUSUAL BRUISING OR BLEEDING *URINARY PROBLEMS (pain or burning when urinating, or frequent urination) *BOWEL PROBLEMS (unusual diarrhea, constipation, pain near the anus) TENDERNESS IN MOUTH AND THROAT WITH OR WITHOUT PRESENCE OF ULCERS (sore throat, sores in mouth, or a toothache) UNUSUAL RASH, SWELLING OR PAIN  UNUSUAL VAGINAL DISCHARGE OR ITCHING   Items with * indicate a potential emergency and should be followed up as soon as possible or go to the Emergency Department if any problems should occur.  Please show the CHEMOTHERAPY ALERT CARD or  IMMUNOTHERAPY ALERT CARD at check-in to the Emergency Department and triage nurse.  Should you have questions after your visit or need to cancel or reschedule your appointment, please contact [facility] - [facility] DEPT [facility]  Dept: [phone]  and follow the prompts.  Office hours are 8:00 a.m. to 4:30 p.m. Monday - Friday. Please note that voicemails left after 4:00 p.m. may not be returned until the following business day.  We are closed weekends and major holidays. You have access to a nurse at all times for urgent questions. Please call the main number to the clinic Dept: [phone] and follow the prompts.   For any non-urgent questions, you may also contact your provider using MyChart. We now offer e-Visits for anyone 49 and older to request care online for non-urgent symptoms. For details visit [email or web address].   Also download the MyChart app! Go to the app store, search "MyChart", open the app, select [facility], and log in with your MyChart username and password.  Retifanlimab Injection What is this medication? RETIFANLIMAB (RE ti FAN li mab) treats skin cancer. It works by helping your immune system slow or stop the spread of cancer cells. It is a monoclonal antibody. This medicine may be used for other purposes; ask your health care provider or pharmacist if you have questions. COMMON BRAND NAME(S): ZYNYZ What should I tell my care team before I take this medication? They need to know if you have any of these conditions: Allogeneic stem cell transplant (uses someone else's stem cells) Autoimmune diseases, such as Crohn's  disease, ulcerative colitis, lupus History of chest radiation Nervous system problems, such as Guillain-Barre syndrome or myasthenia gravis Organ transplant An unusual or allergic reaction to retifanlimab, other medications, foods, dyes, or preservatives Pregnant or trying to get pregnant Breast-feeding How should I  use this medication? This medication is infused into a vein. It is given by your care team in a hospital or clinic setting. A special MedGuide will be given to you before each treatment. Be sure to read this information carefully each time. Talk to your care team about the use of this medication in children. Special care may be needed. Overdosage: If you think you have taken too much of this medicine contact a poison control center or emergency room at once. NOTE: This medicine is only for you. Do not share this medicine with others. What if I miss a dose? Keep appointments for follow-up doses. It is important not to miss your dose. Call your care team if you are unable to keep an appointment. What may interact with this medication? Interactions have not been studied. This list may not describe all possible interactions. Give your health care provider a list of all the medicines, herbs, non-prescription drugs, or dietary supplements you use. Also tell them if you smoke, drink alcohol, or use illegal drugs. Some items may interact with your medicine. What should I watch for while using this medication? Visit your care team for regular checks on your progress. It may be some time before you see the benefit from this medication. You may need blood work while taking this medication. This medication may cause serious skin reactions. They can happen weeks to months after starting the medication. Contact your care team right away if you notice fevers or flu-like symptoms with a rash. The rash may be red or purple and then turn into blisters or peeling of the skin. You may also notice a red rash with swelling of the face, lips, or lymph nodes in your neck or under your arms. Tell your care team right away if you have any change in your eyesight. Talk to your care team if you wish to become pregnant or think you might be pregnant. A negative pregnancy test is required before starting this medication. A  reliable form of contraception is recommended while taking this medication and for 4 months after stopping therapy. Talk to your care team about effective forms of contraception. Do not breast-feed while taking this medication and for 4 months after stopping therapy. What side effects may I notice from receiving this medication? Side effects that you should report to your care team as soon as possible: Allergic reactions--skin rash, itching, hives, swelling of the face, lips, tongue, or throat Dry cough, shortness of breath or trouble breathing Eye pain, redness, irritation, or discharge with blurry or decreased vision Heart muscle inflammation--unusual weakness or fatigue, shortness of breath, chest pain, fast or irregular heartbeat, dizziness, swelling of the ankles, feet, or hands Hormone gland problems--headache, sensitivity to light, unusual weakness or fatigue, dizziness, fast or irregular heartbeat, increased sensitivity to cold or heat, excessive sweating, constipation, hair loss, increased thirst or amount of urine, tremors or shaking, irritability Infusion reactions--chest pain, shortness of breath or trouble breathing, feeling faint or lightheaded Kidney injury (glomerulonephritis)--decrease in the amount of urine, red or dark brown urine, foamy or bubbly urine, swelling of the ankles, hands, or feet Liver injury--right upper belly pain, loss of appetite, nausea, light-colored stool, dark yellow or brown urine, yellowing skin or eyes, unusual  weakness or fatigue Pain, tingling, or numbness in the hands or feet, muscle weakness, change in vision, confusion or trouble speaking, loss of balance or coordination, trouble walking, seizures Rash, fever, and swollen lymph nodes Redness, blistering, peeling, or loosening of the skin, including inside the mouth Sudden or severe stomach pain, bloody diarrhea, fever, nausea, vomiting Side effects that usually do not require medical attention (report  these to your care team if they continue or are bothersome): Bone, joint, or muscle pain Diarrhea Fatigue Loss of appetite Nausea Skin rash This list may not describe all possible side effects. Call your doctor for medical advice about side effects. You may report side effects to FDA at 1-800-FDA-1088. Where should I keep my medication? This medication is given in a hospital or clinic. It will not be stored at home. NOTE: This sheet is a summary. It may not cover all possible information. If you have questions about this medicine, talk to your doctor, pharmacist, or health care provider.  2024 Elsevier/Gold Standard (2022-03-28 00:00:00) Paclitaxel Injection What is this medication? PACLITAXEL (PAK li TAX el) treats some types of cancer. It works by slowing down the growth of cancer cells. This medicine may be used for other purposes; ask your health care provider or pharmacist if you have questions. COMMON BRAND NAME(S): Onxol, Taxol What should I tell my care team before I take this medication? They need to know if you have any of these conditions: Heart disease Liver disease Low white blood cell levels An unusual or allergic reaction to paclitaxel, other medications, foods, dyes, or preservatives If you or your partner are pregnant or trying to get pregnant Breast-feeding How should I use this medication? This medication is injected into a vein. It is given by your care team in a hospital or clinic setting. Talk to your care team about the use of this medication in children. While it may be given to children for selected conditions, precautions do apply. Overdosage: If you think you have taken too much of this medicine contact a poison control center or emergency room at once. NOTE: This medicine is only for you. Do not share this medicine with others. What if I miss a dose? Keep appointments for follow-up doses. It is important not to miss your dose. Call your care team if you are  unable to keep an appointment. What may interact with this medication? Do not take this medication with any of the following: Live virus vaccines Other medications may affect the way this medication works. Talk with your care team about all of the medications you take. They may suggest changes to your treatment plan to lower the risk of side effects and to make sure your medications work as intended. This list may not describe all possible interactions. Give your health care provider a list of all the medicines, herbs, non-prescription drugs, or dietary supplements you use. Also tell them if you smoke, drink alcohol, or use illegal drugs. Some items may interact with your medicine. What should I watch for while using this medication? Your condition will be monitored carefully while you are receiving this medication. You may need blood work while taking this medication. This medication may make you feel generally unwell. This is not uncommon as chemotherapy can affect healthy cells as well as cancer cells. Report any side effects. Continue your course of treatment even though you feel ill unless your care team tells you to stop. This medication can cause serious allergic reactions. To reduce the risk,  your care team may give you other medications to take before receiving this one. Be sure to follow the directions from your care team. This medication may increase your risk of getting an infection. Call your care team for advice if you get a fever, chills, sore throat, or other symptoms of a cold or flu. Do not treat yourself. Try to avoid being around people who are sick. This medication may increase your risk to bruise or bleed. Call your care team if you notice any unusual bleeding. Be careful brushing or flossing your teeth or using a toothpick because you may get an infection or bleed more easily. If you have any dental work done, tell your dentist you are receiving this medication. Talk to your care  team if you may be pregnant. Serious birth defects can occur if you take this medication during pregnancy. Talk to your care team before breastfeeding. Changes to your treatment plan may be needed. What side effects may I notice from receiving this medication? Side effects that you should report to your care team as soon as possible: Allergic reactions--skin rash, itching, hives, swelling of the face, lips, tongue, or throat Heart rhythm changes--fast or irregular heartbeat, dizziness, feeling faint or lightheaded, chest pain, trouble breathing Increase in blood pressure Infection--fever, chills, cough, sore throat, wounds that don't heal, pain or trouble when passing urine, general feeling of discomfort or being unwell Low blood pressure--dizziness, feeling faint or lightheaded, blurry vision Low red blood cell level--unusual weakness or fatigue, dizziness, headache, trouble breathing Painful swelling, warmth, or redness of the skin, blisters or sores at the infusion site Pain, tingling, or numbness in the hands or feet Slow heartbeat--dizziness, feeling faint or lightheaded, confusion, trouble breathing, unusual weakness or fatigue Unusual bruising or bleeding Side effects that usually do not require medical attention (report to your care team if they continue or are bothersome): Diarrhea Hair loss Joint pain Loss of appetite Muscle pain Nausea Vomiting This list may not describe all possible side effects. Call your doctor for medical advice about side effects. You may report side effects to FDA at 1-800-FDA-1088. Where should I keep my medication? This medication is given in a hospital or clinic. It will not be stored at home. NOTE: This sheet is a summary. It may not cover all possible information. If you have questions about this medicine, talk to your doctor, pharmacist, or health care provider.  2024 Elsevier/Gold Standard (2022-04-25 00:00:00)  Carboplatin Injection What is this  medication? CARBOPLATIN (KAR boe pla tin) treats some types of cancer. It works by slowing down the growth of cancer cells. This medicine may be used for other purposes; ask your health care provider or pharmacist if you have questions. COMMON BRAND NAME(S): Paraplatin What should I tell my care team before I take this medication? They need to know if you have any of these conditions: Blood disorders Hearing problems Kidney disease Recent or ongoing radiation therapy An unusual or allergic reaction to carboplatin, cisplatin, other medications, foods, dyes, or preservatives Pregnant or trying to get pregnant Breast-feeding How should I use this medication? This medication is injected into a vein. It is given by your care team in a hospital or clinic setting. Talk to your care team about the use of this medication in children. Special care may be needed. Overdosage: If you think you have taken too much of this medicine contact a poison control center or emergency room at once. NOTE: This medicine is only for you. Do not  share this medicine with others. What if I miss a dose? Keep appointments for follow-up doses. It is important not to miss your dose. Call your care team if you are unable to keep an appointment. What may interact with this medication? Medications for seizures Some antibiotics, such as amikacin, gentamicin, neomycin, streptomycin, tobramycin Vaccines This list may not describe all possible interactions. Give your health care provider a list of all the medicines, herbs, non-prescription drugs, or dietary supplements you use. Also tell them if you smoke, drink alcohol, or use illegal drugs. Some items may interact with your medicine. What should I watch for while using this medication? Your condition will be monitored carefully while you are receiving this medication. You may need blood work while taking this medication. This medication may make you feel generally unwell. This  is not uncommon, as chemotherapy can affect healthy cells as well as cancer cells. Report any side effects. Continue your course of treatment even though you feel ill unless your care team tells you to stop. In some cases, you may be given additional medications to help with side effects. Follow all directions for their use. This medication may increase your risk of getting an infection. Call your care team for advice if you get a fever, chills, sore throat, or other symptoms of a cold or flu. Do not treat yourself. Try to avoid being around people who are sick. Avoid taking medications that contain aspirin, acetaminophen, ibuprofen, naproxen, or ketoprofen unless instructed by your care team. These medications may hide a fever. Be careful brushing or flossing your teeth or using a toothpick because you may get an infection or bleed more easily. If you have any dental work done, tell your dentist you are receiving this medication. Talk to your care team if you wish to become pregnant or think you might be pregnant. This medication can cause serious birth defects. Talk to your care team about effective forms of contraception. Do not breast-feed while taking this medication. What side effects may I notice from receiving this medication? Side effects that you should report to your care team as soon as possible: Allergic reactions--skin rash, itching, hives, swelling of the face, lips, tongue, or throat Infection--fever, chills, cough, sore throat, wounds that don't heal, pain or trouble when passing urine, general feeling of discomfort or being unwell Low red blood cell level--unusual weakness or fatigue, dizziness, headache, trouble breathing Pain, tingling, or numbness in the hands or feet, muscle weakness, change in vision, confusion or trouble speaking, loss of balance or coordination, trouble walking, seizures Unusual bruising or bleeding Side effects that usually do not require medical attention  (report to your care team if they continue or are bothersome): Hair loss Nausea Unusual weakness or fatigue Vomiting This list may not describe all possible side effects. Call your doctor for medical advice about side effects. You may report side effects to FDA at 1-800-FDA-1088. Where should I keep my medication? This medication is given in a hospital or clinic. It will not be stored at home. NOTE: This sheet is a summary. It may not cover all possible information. If you have questions about this medicine, talk to your doctor, pharmacist, or health care provider.  2024 Elsevier/Gold Standard (2022-03-28 00:00:00)

## 2024-01-11 NOTE — Progress Notes (Signed)
[facility] OFFICE PROGRESS NOTE   Diagnosis: Anal cancer  INTERVAL HISTORY:   [name] completed cycle 3 chemotherapy on 12/28/2023.  She reports mild nausea following chemotherapy.  Nausea is relieved with antiemetics.  No neuropathy symptoms.  The right abdominal pain is improved.  No new complaint.  The blood sugar is higher following chemotherapy.  Objective:  Vital signs in last 24 hours:  Blood pressure (!) 139/91, pulse 100, temperature 97.8 F (36.6 C), temperature source Temporal, resp. rate 18, height 5\' 7"  (1.702 m), weight 176 lb 4.8 oz (80 kg), last menstrual period 03/13/2012, SpO2 100%.    HEENT: No thrush or ulcers Resp: Lungs clear bilaterally Cardio: Regular rate and rhythm GI: No hepatosplenomegaly, left lower quadrant colostomy, no mass, nontender Vascular: No leg edema   Portacath/PICC-without erythema  Lab Results:  Lab Results  Component Value Date   WBC 4.6 01/11/2024   HGB 12.3 01/11/2024   HCT 36.7 01/11/2024   MCV 102.2 (H) 01/11/2024   PLT 127 (L) 01/11/2024   NEUTROABS 3.2 01/11/2024    CMP  Lab Results  Component Value Date   NA 133 (L) 01/11/2024   K 4.0 01/11/2024   CL 100 01/11/2024   CO2 25 01/11/2024   GLUCOSE 290 (H) 01/11/2024   BUN 11 01/11/2024   CREATININE 0.72 01/11/2024   CALCIUM 9.1 01/11/2024   PROT 6.9 01/11/2024   ALBUMIN 4.5 01/11/2024   AST 52 (H) 01/11/2024   ALT 90 (H) 01/11/2024   ALKPHOS 90 01/11/2024   BILITOT 0.8 01/11/2024   GFRNONAA >60 01/11/2024   GFRAA 106 11/29/2020    Lab Results  Component Value Date   CEA 1.39 10/10/2023    Lab Results  Component Value Date   INR 1.1 09/07/2023   LABPROT 14.1 09/07/2023    Imaging:  CT ABDOMEN PELVIS W CONTRAST Result Date: 01/09/2024 CLINICAL DATA:  Metastatic anal squamous cell carcinoma restaging. Initial diagnosis 2022 status post chemotherapy and radiation therapy, recurrence in 2024 including a lesion of the right anterior  abdominal wall musculature biopsy proven to be metastatic. * Tracking Code: BO * EXAM: CT ABDOMEN AND PELVIS WITH CONTRAST TECHNIQUE: Multidetector CT imaging of the abdomen and pelvis was performed using the standard protocol following bolus administration of intravenous contrast. RADIATION DOSE REDUCTION: This exam was performed according to the departmental dose-optimization program which includes automated exposure control, adjustment of the mA and/or kV according to patient size and/or use of iterative reconstruction technique. CONTRAST:  OMNIPAQUE IOHEXOL 300 MG/ML  SOLN COMPARISON:  08/31/2023 FINDINGS: Lower chest: A catheter extends into the right atrium. Mild mitral valve calcification. Postoperative findings related to right lower lobectomy. Right lower internal mammary node 0.4 cm in short axis on image 8 series 2. Hepatobiliary: Unremarkable Pancreas: Unremarkable Spleen: Unremarkable Adrenals/Urinary Tract: Unremarkable Stomach/Bowel: Prior abdominoperineal resection, left colostomy noted. Redundant transverse colon extends down into the pelvis adjacent to the presacral thickening, as on the prior exam. Vascular/Lymphatic: Atherosclerosis is present, including aortoiliac atherosclerotic disease. Reproductive: Prior hysterectomy.  Adnexa unremarkable. Other: Presacral thickening is similar to prior and likely therapy related. Musculoskeletal: Postoperative findings from prior rectus abdominus flap to the perineum. The metastatic nodule to the right anterior abdominal wall measures 2.2 by 1.0 cm on image 36 series 2, previously 2.3 by 1.9 cm, compatible with substantial reduction in size. IMPRESSION: 1. Reduction in size of the metastatic nodule to the right anterior abdominal wall. 2. No new sites of metastatic disease identified. 3.  Prior abdominoperineal resection, left colostomy. Prior right lower lobectomy. 4. Presacral thickening is similar to prior and likely therapy related. 5. Aortic  atherosclerosis. Aortic Atherosclerosis (ICD10-I70.0). Electronically Signed   By: Gaylyn Rong M.D.   On: 01/09/2024 11:52    Medications: I have reviewed the patient's current medications.   Assessment/Plan:  Squamous cell carcinoma of the anal canal Colonoscopy 01/28/2021-rectal mass extending to the "outside", palpable on exam-biopsy revealed high-grade squamous intraepithelial lesion (AIN 3/carcinoma in situ) Exam under anesthesia with transanal excision of an anal canal mass 02/25/2021-frozen section consistent with squamous cell carcinoma, 4 x 4 centimeter sessile anal canal mass extending to the anal verge, right lateral and posterior, 30% circumferential, fixed to the anal sphincter complex; anal rectal mass with mixed high-grade neuroendocrine squamous cell carcinoma; posterior anal tag with squamous cell carcinoma; left labial lesion-condyloma acuminata CTs 03/07/2021-ill-definition of tissue planes along the anus with abnormal soft tissue prominence posteriorly along the anal canal suspicious for mass.  Small adjacent lower perirectal lymph nodes in addition to a 0.5 cm sacral node.  Focal enhancing lesion in the right hepatic lobe 1.2 x 0.8 x 0.9 cm.  2.1 x 1.5 cm cystic lesion right ovary with thin enhancing rim. PET scan 03/16/2021-hypermetabolic mass within the anal canal.  No definite evidence of local or distant metastatic disease.  7 mm left submandibular lymph node with mild FDG uptake, likely reactive.  Focal site of FDG uptake within the inner quadrant of the right breast. Radiation 03/21/2021-04/27/2021 Cycle one 5-FU/mitomycin-C 03/21/2021 Cycle two 5-FU/Mitomycin-C 04/18/2021, 5-FU dose reduced secondary to mucositis Anoscopy by Dr. Michaell Cowing 07/20/2021-divet in the right posterior lateral aspect with some right anterior and right lateral folds consistent with radiation treatment.  No ulceration.  Not fully resolved but markedly shrunk down. Anorectal mass 09/30/2021 examination under  anesthesia, biopsy-poorly differentiated carcinoma-basaloid squamous carcinoma and high-grade neuroendocrine carcinoma, margins positive APR/and vertical rectus abdominis flap 11/03/2021, invasive poorly differentiated carcinoma, negative resection margins, 0/10 nodes, treatment response score 2,ypT1,ypN0 CT abdomen/pelvis 12/03/2021-high-grade small bowel obstruction transitioning in the left lower abdomen.  Presacral rim-enhancing 4.4 x 2.8 x 3.8 cm fluid collection.  No air in the collection.  Prior study described a potential abnormality in the right lobe of the liver, not seen on current study.  Cystic lesion of the right ovary resolved. CT chest 12/07/2021-interval development of bilateral trace pleural effusions. CTs 12/04/2022-new 2.6 x 2.5 cm right lower lobe nodule PET 12/21/2022-FDG avid right lower lobe nodule, no other evidence of metastatic disease 12/29/2022 right lower lobectomy, lymph node dissection-poorly differentiated carcinoma consistent with metastasis, 4.8 x 2.9 x 2.6 cm, margins free, 7 lymph nodes negative for carcinoma; cytohistomorphology correlation supports the current tumor being a metastasis from the previously diagnosed anal carcinoma CT chest 04/17/2023-interval right lower lobectomy with resection of right lung mass, pleural thickening and small right pleural effusion, no new mass or lymph node enlargement CTs 07/20/2023-diminished postoperative right pleural effusion.  Unchanged postoperative findings status post abdominoperineal resection with left lower quadrant and colostomy.  No evidence of metastatic disease. CT abdomen/pelvis 08/31/2023-heterogenous soft tissue nodule in the right upper quadrant anterior abdominal wall, no evidence of metastatic disease CT/ultrasound guided biopsy of right abdominal wall mass 09/07/2023-metastatic neoplasm consistent with a metastasis from anal cancer, Foundation 1-HRD signature negative, MSS, tumor mutation burden 4, BRCA 1 alteration,  PD-L1 TPS 10% Cycle 1 day 1 Taxol//carboplatin 10/19/2023, day 8 Taxol 10/26/2023, day 15 Taxol held 11/02/2023 due to neutropenia Cycle 2-day 1 Taxol/carboplatin plus Retifanlimab 11/14/2023,  Taxol and carboplatin dose reduced due to previous neutropenia, mild thrombocytopenia; day 8 Taxol 11/22/2023, day 15 Taxol 11/29/2023 Cycle 3-day 1 Taxol/carboplatin plus retifanlimab 12/14/2023, day 8 Taxol 12/21/2023, day 15 Taxol 12/28/2023 CT abdomen/pelvis 01/09/2024: Decreased right anterior abdominal wall nodule, no evidence of disease progression Cycle 4-day 1 Taxol/carboplatin/retifanlimab 01/11/2024   Remote history of an anal fissure repair Tobacco use Moderate alcohol use Eczema Right breast mass- PET scan 03/16/2021 with focal FDG activity in the inner right breast Mammogram and right breast ultrasound 03/28/2021- negative Bilateral breast MRI 03/31/2021- 7 mm mass in the upper inner right breast correlating with the PET findings, no other abnormality, no adenopathy MRI guided biopsy of right breast mass 04/11/2021- fibrocystic change with sclerosing adenosis, small focus of inflammation/fibrosis suggestive of resolving fat necrosis, no evidence of malignancy--repeat bilateral breast MRI at a 23-month interval; bilateral breast MRI on 10/13/2021-no evidence of breast malignancy.  Annual screening mammography recommended.   7.  Admission with small bowel obstruction 12/03/2021 status post exploratory laparotomy and lysis of adhesions 8.  Right abdominal pain/tenderness-potentially related to the right abdominal wall mass noted on CT 08/31/2023 9.  Elevated liver enzymes-chronic, etiology unclear 10.  Hematuria-evaluated by urology 09/11/2023, cystoscopy with radiation and infection changes, no tumor seen 11.  Right neck/chest tenderness-referred for venous Doppler study to rule out DVT.     Disposition: [name] has metastatic anal cancer.  She has completed 3 cycles of systemic therapy.  There has  been clinical and radiologic improvement.  I reviewed the restaging CT findings and images with her.  The right abdominal wall mass is significantly smaller.  The plan is to proceed with cycle 4 Taxol/carboplatin/Retifanlimab today.  I decreased the Decadron dose in the premedication and attempt to improve posttreatment hyperglycemia.  She will return for an office visit and Taxol in 1 week.  Thornton Papas, MD  01/11/2024  1:42 PM

## 2024-01-12 ENCOUNTER — Other Ambulatory Visit: Payer: Self-pay

## 2024-01-14 ENCOUNTER — Other Ambulatory Visit: Payer: BC Managed Care – PPO

## 2024-01-14 ENCOUNTER — Ambulatory Visit: Payer: BC Managed Care – PPO | Admitting: Nurse Practitioner

## 2024-01-14 ENCOUNTER — Ambulatory Visit: Payer: BC Managed Care – PPO

## 2024-01-16 ENCOUNTER — Inpatient Hospital Stay: Payer: BC Managed Care – PPO | Admitting: Nurse Practitioner

## 2024-01-16 ENCOUNTER — Encounter: Payer: Self-pay | Admitting: Nurse Practitioner

## 2024-01-16 ENCOUNTER — Telehealth: Payer: Self-pay | Admitting: *Deleted

## 2024-01-16 ENCOUNTER — Other Ambulatory Visit: Payer: Self-pay

## 2024-01-16 ENCOUNTER — Ambulatory Visit (HOSPITAL_COMMUNITY)
Admission: RE | Admit: 2024-01-16 | Discharge: 2024-01-16 | Disposition: A | Discharge status: Home / Self Care | Payer: BC Managed Care – PPO | Source: Ambulatory Visit | Attending: Nurse Practitioner | Admitting: Nurse Practitioner

## 2024-01-16 ENCOUNTER — Other Ambulatory Visit (HOSPITAL_BASED_OUTPATIENT_CLINIC_OR_DEPARTMENT_OTHER): Payer: Self-pay

## 2024-01-16 ENCOUNTER — Other Ambulatory Visit: Payer: Self-pay | Admitting: Nurse Practitioner

## 2024-01-16 VITALS — BP 138/94 | HR 109 | Temp 97.9°F | Resp 18 | Ht 67.0 in | Wt 175.9 lb

## 2024-01-16 DIAGNOSIS — C210 Malignant neoplasm of anus, unspecified: Secondary | ICD-10-CM

## 2024-01-16 HISTORY — PX: IR PATIENT EVAL TECH 0-60 MINS: IMG5564

## 2024-01-16 MED ORDER — FLUCONAZOLE 150 MG PO TABS
150.0000 mg | ORAL_TABLET | Freq: Once | ORAL | 0 refills | Status: DC
  Filled 2024-01-16: qty 2, 2d supply, fill #0

## 2024-01-16 MED ORDER — AMOXICILLIN-POT CLAVULANATE 875-125 MG PO TABS
1.0000 | ORAL_TABLET | Freq: Two times a day (BID) | ORAL | 0 refills | Status: DC
  Filled 2024-01-16: qty 20, 10d supply, fill #0

## 2024-01-16 NOTE — Telephone Encounter (Signed)
Beth [name] called to report that beginning yesterday, her port site is tender and red with the redness extending up to her neck. No fever or swelling and no open area of drainage. Per [name]: come to office now. Patient notified and can be her in 45 minutes.

## 2024-01-16 NOTE — Procedures (Signed)
  Patient ID: Beth [name], female   DOB: 1971-09-30, 53 y.o.   MRN: [id number] Patient presented to IR department today following referral from oncology to evaluate right chest wall Port-A-Cath site.  Port-A-Cath was placed on 10/18/2023 without immediate complications.  Patient now reports 24-hour period of increasing discomfort at port site along with some erythema.  She denies fever, chills, or drainage at port site.  Prior ultrasound of region 12/21/2023 reported no evidence of complications.  Patient states that port was last utilized last Friday.  She was prescribed Augmentin today by oncology.  She currently rates discomfort at a 6 out of 10 level.  Patient was seen and examined by Dr. Loreta Ave.  Port-A-Cath site has minimal erythema but no obvious drainage or worsening edema.  Area is mildly tender to palpation.  Patient states that pain is exacerbated somewhat with turning of the neck to the left.  At this time area does not appear to be acutely infected.  Recommend completion of antibiotic therapy with intermittent cool/warm compresses to port/lower neck site as needed for 15 to 20-minute intervals.  Some discomfort may be related to scarring along tract of catheter and passage through neck muscles.  Patient also encouraged to do some neck stretches as needed.  She will follow-up in the IR department after completion of antibiotic therapy. She was told to contact our service in the interim with any new or worsening symptoms.          -Both Dr Ruthy Dick and Jeananne Rama PA-C evaluated her.  She was given a follow-up appointment for Friday, January 7th at 8am

## 2024-01-16 NOTE — Progress Notes (Unsigned)
[facility] OFFICE PROGRESS NOTE   Diagnosis: Anal cancer  INTERVAL HISTORY:   Beth [name] returns prior to scheduled follow-up for evaluation of redness and pain at the Port-A-Cath site.  She completed cycle 4-day 1 Taxol/carboplatin/retifanlimab 01/11/2024.  Yesterday she noted discomfort around the Port-A-Cath site extending upward to the neck and surrounding erythema.  No arm edema.  No fever.  No chills.  She otherwise feels well.  She was seen for a similar occurrence on 12/21/2023.  Doppler study negative for DVT.  Symptoms resolved.  Objective:  Vital signs in last 24 hours:  Blood pressure (!) 138/94, pulse (!) 109, temperature 97.9 F (36.6 C), temperature source Temporal, resp. rate 18, height 5\' 7"  (1.702 m), weight 175 lb 14.4 oz (79.8 kg), last menstrual period 03/13/2012, SpO2 100%.    HEENT: No thrush or ulcers. Resp: Lungs clear bilaterally. Cardio: Regular rate and rhythm. GI: Abdomen soft and nontender. Vascular: No upper extremity edema.  No lower extremity edema. Neuro: Alert and oriented. Skin: Erythema surrounding the Port-A-Cath site/right upper chest. Port-A-Cath site is markedly tender as is the Port-A-Cath tract extending to the right neck.  Lab Results:  Lab Results  Component Value Date   WBC 4.6 01/11/2024   HGB 12.3 01/11/2024   HCT 36.7 01/11/2024   MCV 102.2 (H) 01/11/2024   PLT 127 (L) 01/11/2024   NEUTROABS 3.2 01/11/2024    Imaging:  No results found.  Medications: I have reviewed the patient's current medications.  Assessment/Plan: Squamous cell carcinoma of the anal canal Colonoscopy 01/28/2021-rectal mass extending to the "outside", palpable on exam-biopsy revealed high-grade squamous intraepithelial lesion (AIN 3/carcinoma in situ) Exam under anesthesia with transanal excision of an anal canal mass 02/25/2021-frozen section consistent with squamous cell carcinoma, 4 x 4 centimeter sessile anal canal mass extending to  the anal verge, right lateral and posterior, 30% circumferential, fixed to the anal sphincter complex; anal rectal mass with mixed high-grade neuroendocrine squamous cell carcinoma; posterior anal tag with squamous cell carcinoma; left labial lesion-condyloma acuminata CTs 03/07/2021-ill-definition of tissue planes along the anus with abnormal soft tissue prominence posteriorly along the anal canal suspicious for mass.  Small adjacent lower perirectal lymph nodes in addition to a 0.5 cm sacral node.  Focal enhancing lesion in the right hepatic lobe 1.2 x 0.8 x 0.9 cm.  2.1 x 1.5 cm cystic lesion right ovary with thin enhancing rim. PET scan 03/16/2021-hypermetabolic mass within the anal canal.  No definite evidence of local or distant metastatic disease.  7 mm left submandibular lymph node with mild FDG uptake, likely reactive.  Focal site of FDG uptake within the inner quadrant of the right breast. Radiation 03/21/2021-04/27/2021 Cycle one 5-FU/mitomycin-C 03/21/2021 Cycle two 5-FU/Mitomycin-C 04/18/2021, 5-FU dose reduced secondary to mucositis Anoscopy by Dr. Michaell Cowing 07/20/2021-divet in the right posterior lateral aspect with some right anterior and right lateral folds consistent with radiation treatment.  No ulceration.  Not fully resolved but markedly shrunk down. Anorectal mass 09/30/2021 examination under anesthesia, biopsy-poorly differentiated carcinoma-basaloid squamous carcinoma and high-grade neuroendocrine carcinoma, margins positive APR/and vertical rectus abdominis flap 11/03/2021, invasive poorly differentiated carcinoma, negative resection margins, 0/10 nodes, treatment response score 2,ypT1,ypN0 CT abdomen/pelvis 12/03/2021-high-grade small bowel obstruction transitioning in the left lower abdomen.  Presacral rim-enhancing 4.4 x 2.8 x 3.8 cm fluid collection.  No air in the collection.  Prior study described a potential abnormality in the right lobe of the liver, not seen on current study.  Cystic  lesion of the right  ovary resolved. CT chest 12/07/2021-interval development of bilateral trace pleural effusions. CTs 12/04/2022-new 2.6 x 2.5 cm right lower lobe nodule PET 12/21/2022-FDG avid right lower lobe nodule, no other evidence of metastatic disease 12/29/2022 right lower lobectomy, lymph node dissection-poorly differentiated carcinoma consistent with metastasis, 4.8 x 2.9 x 2.6 cm, margins free, 7 lymph nodes negative for carcinoma; cytohistomorphology correlation supports the current tumor being a metastasis from the previously diagnosed anal carcinoma CT chest 04/17/2023-interval right lower lobectomy with resection of right lung mass, pleural thickening and small right pleural effusion, no new mass or lymph node enlargement CTs 07/20/2023-diminished postoperative right pleural effusion.  Unchanged postoperative findings status post abdominoperineal resection with left lower quadrant and colostomy.  No evidence of metastatic disease. CT abdomen/pelvis 08/31/2023-heterogenous soft tissue nodule in the right upper quadrant anterior abdominal wall, no evidence of metastatic disease CT/ultrasound guided biopsy of right abdominal wall mass 09/07/2023-metastatic neoplasm consistent with a metastasis from anal cancer, Foundation 1-HRD signature negative, MSS, tumor mutation burden 4, BRCA 1 alteration, PD-L1 TPS 10% Cycle 1 day 1 Taxol//carboplatin 10/19/2023, day 8 Taxol 10/26/2023, day 15 Taxol held 11/02/2023 due to neutropenia Cycle 2-day 1 Taxol/carboplatin plus Retifanlimab 11/14/2023, Taxol and carboplatin dose reduced due to previous neutropenia, mild thrombocytopenia; day 8 Taxol 11/22/2023, day 15 Taxol 11/29/2023 Cycle 3-day 1 Taxol/carboplatin plus retifanlimab 12/14/2023, day 8 Taxol 12/21/2023, day 15 Taxol 12/28/2023 CT abdomen/pelvis 01/09/2024: Decreased right anterior abdominal wall nodule, no evidence of disease progression Cycle 4-day 1 Taxol/carboplatin/retifanlimab 01/11/2024   Remote  history of an anal fissure repair Tobacco use Moderate alcohol use Eczema Right breast mass- PET scan 03/16/2021 with focal FDG activity in the inner right breast Mammogram and right breast ultrasound 03/28/2021- negative Bilateral breast MRI 03/31/2021- 7 mm mass in the upper inner right breast correlating with the PET findings, no other abnormality, no adenopathy MRI guided biopsy of right breast mass 04/11/2021- fibrocystic change with sclerosing adenosis, small focus of inflammation/fibrosis suggestive of resolving fat necrosis, no evidence of malignancy--repeat bilateral breast MRI at a 83-month interval; bilateral breast MRI on 10/13/2021-no evidence of breast malignancy.  Annual screening mammography recommended.   7.  Admission with small bowel obstruction 12/03/2021 status post exploratory laparotomy and lysis of adhesions 8.  Right abdominal pain/tenderness-potentially related to the right abdominal wall mass noted on CT 08/31/2023 9.  Elevated liver enzymes-chronic, etiology unclear 10.  Hematuria-evaluated by urology 09/11/2023, cystoscopy with radiation and infection changes, no tumor seen 11.  Right neck/chest tenderness-referred for venous Doppler study to rule out DVT.  Disposition: Ms. Barz appears stable.  She completed cycle 4-day 1 Taxol/carboplatin/retifanlimab 01/11/2024.  She presents today for evaluation of redness and pain involving the Port-A-Cath.  She had similar symptoms but no erythema on 12/21/2023.  Doppler study negative at that time.  We are concerned for Port-A-Cath infection.  She will begin Augmentin twice daily.  She is being evaluated in the Interventional Radiology department today to see if the Port-A-Cath needs to be removed.  She understands to contact the office if she develops a fever, shaking chills.  She tends to develop a vaginal yeast infection with antibiotics.  Prescription sent to her pharmacy for Diflucan 150 mg p.o. x 1 if needed.  Patient seen with  Dr. Truett Perna.  [name] ANP/GNP-BC   01/16/2024  2:30 PM   This was a shared visit with [name].  Ms. Kreager was interviewed and examined.  She presents with erythema and tenderness surrounding the Port-A-Cath and subcutaneous tract.  We  suspect a Port-A-Cath infection.  She will begin antibiotics and we will place a referral to interventional radiology to consider Port-A-Cath removal.  I was present for greater than 50% of today's visit.  I performed medical decision making.  Mancel Bale, MD

## 2024-01-16 NOTE — Progress Notes (Signed)
Patient ID: Beth [name], female   DOB: 12-02-1971, 53 y.o.   MRN: [id number] Patient presented to IR department today following referral from oncology to evaluate right chest wall Port-A-Cath site.  Port-A-Cath was placed on 10/18/2023 without immediate complications.  Patient now reports 24-hour period of increasing discomfort at port site along with some erythema.  She denies fever, chills, or drainage at port site.  Prior ultrasound of region 12/21/2023 reported no evidence of complications.  Patient states that port was last utilized last Friday.  She was prescribed Augmentin today by oncology.  She currently rates discomfort at a 6 out of 10 level.  Patient was seen and examined by Dr. Loreta Ave.  Port-A-Cath site has minimal erythema but no obvious drainage or worsening edema.  Area is mildly tender to palpation.  Patient states that pain is exacerbated somewhat with turning of the neck to the left.  At this time area does not appear to be acutely infected.  Recommend completion of antibiotic therapy with intermittent cool/warm compresses to port/lower neck site as needed for 15 to 20-minute intervals.  Some discomfort may be related to scarring along tract of catheter and passage through neck muscles.  Patient also encouraged to do some neck stretches as needed.  She will follow-up in the IR department after completion of antibiotic therapy. She was told to contact our service in the interim with any new or worsening symptoms.

## 2024-01-17 ENCOUNTER — Encounter: Payer: Self-pay | Admitting: Oncology

## 2024-01-18 ENCOUNTER — Inpatient Hospital Stay: Payer: BC Managed Care – PPO

## 2024-01-18 ENCOUNTER — Encounter: Payer: Self-pay | Admitting: Nurse Practitioner

## 2024-01-18 ENCOUNTER — Telehealth: Payer: Self-pay

## 2024-01-18 ENCOUNTER — Inpatient Hospital Stay: Payer: BC Managed Care – PPO | Admitting: Nurse Practitioner

## 2024-01-18 ENCOUNTER — Other Ambulatory Visit: Payer: Self-pay

## 2024-01-18 ENCOUNTER — Other Ambulatory Visit: Payer: Self-pay | Admitting: Nurse Practitioner

## 2024-01-18 VITALS — BP 131/97 | HR 99 | Temp 98.1°F | Resp 18 | Ht 67.0 in | Wt 172.3 lb

## 2024-01-18 VITALS — BP 132/83 | HR 94 | Temp 98.1°F | Resp 18

## 2024-01-18 DIAGNOSIS — R221 Localized swelling, mass and lump, neck: Secondary | ICD-10-CM

## 2024-01-18 DIAGNOSIS — C210 Malignant neoplasm of anus, unspecified: Secondary | ICD-10-CM | POA: Diagnosis not present

## 2024-01-18 DIAGNOSIS — Z95828 Presence of other vascular implants and grafts: Secondary | ICD-10-CM

## 2024-01-18 DIAGNOSIS — R52 Pain, unspecified: Secondary | ICD-10-CM

## 2024-01-18 LAB — CBC WITH DIFFERENTIAL (CANCER CENTER ONLY)
Abs Immature Granulocytes: 0.04 10*3/uL (ref 0.00–0.07)
Basophils Absolute: 0 10*3/uL (ref 0.0–0.1)
Basophils Relative: 1 %
Eosinophils Absolute: 0.1 10*3/uL (ref 0.0–0.5)
Eosinophils Relative: 3 %
HCT: 34.6 % — ABNORMAL LOW (ref 36.0–46.0)
Hemoglobin: 11.6 g/dL — ABNORMAL LOW (ref 12.0–15.0)
Immature Granulocytes: 1 %
Lymphocytes Relative: 11 %
Lymphs Abs: 0.4 10*3/uL — ABNORMAL LOW (ref 0.7–4.0)
MCH: 34 pg (ref 26.0–34.0)
MCHC: 33.5 g/dL (ref 30.0–36.0)
MCV: 101.5 fL — ABNORMAL HIGH (ref 80.0–100.0)
Monocytes Absolute: 0.3 10*3/uL (ref 0.1–1.0)
Monocytes Relative: 8 %
Neutro Abs: 2.9 10*3/uL (ref 1.7–7.7)
Neutrophils Relative %: 76 %
Platelet Count: 142 10*3/uL — ABNORMAL LOW (ref 150–400)
RBC: 3.41 MIL/uL — ABNORMAL LOW (ref 3.87–5.11)
RDW: 14.2 % (ref 11.5–15.5)
WBC Count: 3.8 10*3/uL — ABNORMAL LOW (ref 4.0–10.5)
nRBC: 0 % (ref 0.0–0.2)

## 2024-01-18 LAB — CMP (CANCER CENTER ONLY)
ALT: 149 U/L — ABNORMAL HIGH (ref 0–44)
AST: 71 U/L — ABNORMAL HIGH (ref 15–41)
Albumin: 4.1 g/dL (ref 3.5–5.0)
Alkaline Phosphatase: 88 U/L (ref 38–126)
Anion gap: 7 (ref 5–15)
BUN: 9 mg/dL (ref 6–20)
CO2: 26 mmol/L (ref 22–32)
Calcium: 9.6 mg/dL (ref 8.9–10.3)
Chloride: 104 mmol/L (ref 98–111)
Creatinine: 0.52 mg/dL (ref 0.44–1.00)
GFR, Estimated: >60 mL/min (ref >60)
Glucose, Bld: 198 mg/dL — ABNORMAL HIGH (ref 70–99)
Potassium: 4.1 mmol/L (ref 3.5–5.1)
Sodium: 137 mmol/L (ref 135–145)
Total Bilirubin: 0.6 mg/dL (ref 0.0–1.2)
Total Protein: 6.9 g/dL (ref 6.5–8.1)

## 2024-01-18 MED ORDER — HEPARIN SOD (PORK) LOCK FLUSH 100 UNIT/ML IV SOLN
500.0000 [IU] | Freq: Once | INTRAVENOUS | Status: AC | PRN
  Administered 2024-01-18: 500 [IU]

## 2024-01-18 MED ORDER — SODIUM CHLORIDE 0.9 % IV SOLN
INTRAVENOUS | Status: DC
Start: 2024-01-18 — End: 2024-01-18

## 2024-01-18 MED ORDER — SODIUM CHLORIDE 0.9 % IV SOLN
60.0000 mg/m2 | Freq: Once | INTRAVENOUS | Status: AC
  Administered 2024-01-18: 120 mg via INTRAVENOUS
  Filled 2024-01-18: qty 20

## 2024-01-18 MED ORDER — FAMOTIDINE IN NACL 20-0.9 MG/50ML-% IV SOLN
20.0000 mg | Freq: Once | INTRAVENOUS | Status: AC
  Administered 2024-01-18: 20 mg via INTRAVENOUS
  Filled 2024-01-18: qty 50

## 2024-01-18 MED ORDER — DEXAMETHASONE SODIUM PHOSPHATE 10 MG/ML IJ SOLN
5.0000 mg | Freq: Once | INTRAMUSCULAR | Status: AC
Start: 2024-01-18 — End: 2024-01-18
  Administered 2024-01-18: 5 mg via INTRAVENOUS
  Filled 2024-01-18: qty 1

## 2024-01-18 MED ORDER — DIPHENHYDRAMINE HCL 50 MG/ML IJ SOLN
25.0000 mg | Freq: Once | INTRAMUSCULAR | Status: AC
Start: 2024-01-18 — End: 2024-01-18
  Administered 2024-01-18: 25 mg via INTRAVENOUS
  Filled 2024-01-18: qty 1

## 2024-01-18 MED ORDER — SODIUM CHLORIDE 0.9% FLUSH
10.0000 mL | INTRAVENOUS | Status: DC | PRN
  Administered 2024-01-18: 10 mL

## 2024-01-18 NOTE — Telephone Encounter (Addendum)
Spoke with Olegario Messier at Interventional Radiology at [facility] to schedule [facility] Dye Study due to persistent tenderness/ swelling at [facility] site extending upward to right neck. Per provider schedule Monday or Wednesday early mornings/ late afternoon. Olegario Messier stated would make Tiffany the scheduler aware, then would call the patient.

## 2024-01-18 NOTE — Patient Instructions (Signed)
 Implanted [facility] Guide An implanted port is a device that is placed under the skin. It is usually placed in the chest. The device may vary based on the need. Implanted ports can be used to give IV medicine, to take blood, or to give fluids. You may have an implanted port if: You need IV medicine that would be irritating to the small veins in your hands or arms. You need IV medicines, such as chemotherapy, for a long period of time. You need IV nutrition for a long period of time. You may have fewer limitations when using a port than you would if you used other types of long-term IVs. You will also likely be able to return to normal activities after your incision heals. An implanted port has two main parts: Reservoir. The reservoir is the part where a needle is inserted to give medicines or draw blood. The reservoir is round. After the port is placed, it appears as a small, raised area under your skin. Catheter. The catheter is a small, thin tube that connects the reservoir to a vein. Medicine that is inserted into the reservoir goes into the catheter and then into the vein. How is my port accessed? To access your port: A numbing cream may be placed on the skin over the port site. Your health care provider will put on a mask and sterile gloves. The skin over your port will be cleaned carefully with a germ-killing soap and allowed to dry. Your health care provider will gently pinch the port and insert a needle into it. Your health care provider will check for a blood return to make sure the port is in the vein and is still working (patent). If your port needs to remain accessed to get medicine continuously (constant infusion), your health care provider will place a clear bandage (dressing) over the needle site. The dressing and needle will need to be changed every week, or as told by your health care provider. What is flushing? Flushing helps keep the port working. Follow instructions from your  health care provider about how and when to flush the port. Ports are usually flushed with saline solution or a medicine called heparin. The need for flushing will depend on how the port is used: If the port is only used from time to time to give medicines or draw blood, the port may need to be flushed: Before and after medicines have been given. Before and after blood has been drawn. As part of routine maintenance. Flushing may be recommended every 4-6 weeks. If a constant infusion is running, the port may not need to be flushed. Throw away any syringes in a disposal container that is meant for sharp items (sharps container). You can buy a sharps container from a pharmacy, or you can make one by using an empty hard plastic bottle with a cover. How long will my port stay implanted? The port can stay in for as long as your health care provider thinks it is needed. When it is time for the port to come out, a surgery will be done to remove it. The surgery will be similar to the procedure that was done to put the port in. Follow these instructions at home: Caring for your port and port site Flush your port as told by your health care provider. If you need an infusion over several days, follow instructions from your health care provider about how to take care of your port site. Make sure you: Change your  dressing as told by your health care provider. Wash your hands with soap and water for at least 20 seconds before and after you change your dressing. If soap and water are not available, use alcohol-based hand sanitizer. Place any used dressings or infusion bags into a plastic bag. Throw that bag in the trash. Keep the dressing that covers the needle clean and dry. Do not get it wet. Do not use scissors or sharp objects near the infusion tubing. Keep any external tubes clamped, unless they are being used. Check your port site every day for signs of infection. Check for: Redness, swelling, or  pain. Fluid or blood. Warmth. Pus or a bad smell. Protect the skin around the port site. Avoid wearing bra straps that rub or irritate the site. Protect the skin around your port from seat belts. Place a soft pad over your chest if needed. Bathe or shower as told by your health care provider. The site may get wet as long as you are not actively receiving an infusion. General instructions  Return to your normal activities as told by your health care provider. Ask your health care provider what activities are safe for you. Carry a medical alert card or wear a medical alert bracelet at all times. This will let health care providers know that you have an implanted port in case of an emergency. Where to find more information American Cancer Society: www.cancer.org American Society of Clinical Oncology: www.cancer.net Contact a health care provider if: You have a fever or chills. You have redness, swelling, or pain at the port site. You have fluid or blood coming from your port site. Your incision feels warm to the touch. You have pus or a bad smell coming from the port site. Summary Implanted ports are usually placed in the chest for long-term IV access. Follow instructions from your health care provider about flushing the port and changing bandages (dressings). Take care of the area around your port by avoiding clothing that puts pressure on the area, and by watching for signs of infection. Protect the skin around your port from seat belts. Place a soft pad over your chest if needed. Contact a health care provider if you have a fever or you have redness, swelling, pain, fluid, or a bad smell at the port site. This information is not intended to replace advice given to you by your health care provider. Make sure you discuss any questions you have with your health care provider. Document Revised: 06/07/2021 Document Reviewed: 06/07/2021 Elsevier Patient Education  2024 [employer].

## 2024-01-18 NOTE — Progress Notes (Signed)
[facility] OFFICE PROGRESS NOTE   Diagnosis: Anal cancer  INTERVAL HISTORY:   Beth [name] returns as scheduled.  She completed cycle 4-day 1 Taxol/carboplatin/retifanlimab 01/11/2024.  She was seen in an unscheduled visit 01/16/2024 for evaluation of redness and pain involving the Port-A-Cath.  She was started on Augmentin.  She was seen in Interventional Radiology same day.  Recommendation to complete antibiotic therapy, apply compresses as needed.  She has follow-up with Interventional Radiology 01/25/2024.  She reports the redness surrounding the Port-A-Cath had resolved by the time she was evaluated in Interventional Radiology.  She still has discomfort along the Port-A-Cath tract.  Ice packs help somewhat.  No fever or chills.  She feels the swelling is unchanged.  She has mild nausea after chemotherapy.  No mouth sores.  She develops small nose sores intermittently following chemotherapy, resolve quickly.  She had a loose stool yesterday.  No numbness or tingling in the hands or feet.  Taste is altered at times.  Abdominal pain continues to be improved.  Objective:  Vital signs in last 24 hours:  Blood pressure (!) 131/97, pulse 99, temperature 98.1 F (36.7 C), temperature source Temporal, resp. rate 18, height 5\' 7"  (1.702 m), weight 172 lb 4.8 oz (78.2 kg), last menstrual period 03/13/2012, SpO2 100%.    HEENT: No thrush or ulcers.  No ulcers noted at the lower nasal passages. Resp: Lungs clear bilaterally. Cardio: Regular rate and rhythm. GI: No hepatosplenomegaly. Vascular: No leg edema. Skin: Palms without erythema. Port-A-Cath site is without erythema.  Tender along the Port-A-Cath tract extending to the lower neck.  Lab Results:  Lab Results  Component Value Date   WBC 3.8 (L) 01/18/2024   HGB 11.6 (L) 01/18/2024   HCT 34.6 (L) 01/18/2024   MCV 101.5 (H) 01/18/2024   PLT 142 (L) 01/18/2024   NEUTROABS 2.9 01/18/2024    Imaging:  IR PATIENT EVAL TECH  0-60 MINS Result Date: 01/16/2024 Beth [name]     01/16/2024  4:25 PM Patient ID: Beth [name], female   DOB: July 12, 1971, 53 y.o.   MRN: [id number] Patient presented to IR department today following referral from oncology to evaluate right chest wall Port-A-Cath site.  Port-A-Cath was placed on 10/18/2023 without immediate complications.  Patient now reports 24-hour period of increasing discomfort at port site along with some erythema.  She denies fever, chills, or drainage at port site.  Prior ultrasound of region 12/21/2023 reported no evidence of complications.  Patient states that port was last utilized last Friday.  She was prescribed Augmentin today by oncology.  She currently rates discomfort at a 6 out of 10 level.  Patient was seen and examined by Dr. Loreta Ave.  Port-A-Cath site has minimal erythema but no obvious drainage or worsening edema.  Area is mildly tender to palpation.  Patient states that pain is exacerbated somewhat with turning of the neck to the left.  At this time area does not appear to be acutely infected.  Recommend completion of antibiotic therapy with intermittent cool/warm compresses to port/lower neck site as needed for 15 to 20-minute intervals.  Some discomfort may be related to scarring along tract of catheter and passage through neck muscles.  Patient also encouraged to do some neck stretches as needed.  She will follow-up in the IR department after completion of antibiotic therapy. She was told to contact our service in the interim with any new or worsening symptoms.     -Both Dr Ruthy Dick and  Jeananne Rama PA-C evaluated her.  She was given a follow-up appointment for Friday, January 7th at 8am   Medications: I have reviewed the patient's current medications.  Assessment/Plan: Squamous cell carcinoma of the anal canal Colonoscopy 01/28/2021-rectal mass extending to the "outside", palpable on exam-biopsy revealed high-grade squamous intraepithelial lesion (AIN  3/carcinoma in situ) Exam under anesthesia with transanal excision of an anal canal mass 02/25/2021-frozen section consistent with squamous cell carcinoma, 4 x 4 centimeter sessile anal canal mass extending to the anal verge, right lateral and posterior, 30% circumferential, fixed to the anal sphincter complex; anal rectal mass with mixed high-grade neuroendocrine squamous cell carcinoma; posterior anal tag with squamous cell carcinoma; left labial lesion-condyloma acuminata CTs 03/07/2021-ill-definition of tissue planes along the anus with abnormal soft tissue prominence posteriorly along the anal canal suspicious for mass.  Small adjacent lower perirectal lymph nodes in addition to a 0.5 cm sacral node.  Focal enhancing lesion in the right hepatic lobe 1.2 x 0.8 x 0.9 cm.  2.1 x 1.5 cm cystic lesion right ovary with thin enhancing rim. PET scan 03/16/2021-hypermetabolic mass within the anal canal.  No definite evidence of local or distant metastatic disease.  7 mm left submandibular lymph node with mild FDG uptake, likely reactive.  Focal site of FDG uptake within the inner quadrant of the right breast. Radiation 03/21/2021-04/27/2021 Cycle one 5-FU/mitomycin-C 03/21/2021 Cycle two 5-FU/Mitomycin-C 04/18/2021, 5-FU dose reduced secondary to mucositis Anoscopy by Dr. Michaell Cowing 07/20/2021-divet in the right posterior lateral aspect with some right anterior and right lateral folds consistent with radiation treatment.  No ulceration.  Not fully resolved but markedly shrunk down. Anorectal mass 09/30/2021 examination under anesthesia, biopsy-poorly differentiated carcinoma-basaloid squamous carcinoma and high-grade neuroendocrine carcinoma, margins positive APR/and vertical rectus abdominis flap 11/03/2021, invasive poorly differentiated carcinoma, negative resection margins, 0/10 nodes, treatment response score 2,ypT1,ypN0 CT abdomen/pelvis 12/03/2021-high-grade small bowel obstruction transitioning in the left lower  abdomen.  Presacral rim-enhancing 4.4 x 2.8 x 3.8 cm fluid collection.  No air in the collection.  Prior study described a potential abnormality in the right lobe of the liver, not seen on current study.  Cystic lesion of the right ovary resolved. CT chest 12/07/2021-interval development of bilateral trace pleural effusions. CTs 12/04/2022-new 2.6 x 2.5 cm right lower lobe nodule PET 12/21/2022-FDG avid right lower lobe nodule, no other evidence of metastatic disease 12/29/2022 right lower lobectomy, lymph node dissection-poorly differentiated carcinoma consistent with metastasis, 4.8 x 2.9 x 2.6 cm, margins free, 7 lymph nodes negative for carcinoma; cytohistomorphology correlation supports the current tumor being a metastasis from the previously diagnosed anal carcinoma CT chest 04/17/2023-interval right lower lobectomy with resection of right lung mass, pleural thickening and small right pleural effusion, no new mass or lymph node enlargement CTs 07/20/2023-diminished postoperative right pleural effusion.  Unchanged postoperative findings status post abdominoperineal resection with left lower quadrant and colostomy.  No evidence of metastatic disease. CT abdomen/pelvis 08/31/2023-heterogenous soft tissue nodule in the right upper quadrant anterior abdominal wall, no evidence of metastatic disease CT/ultrasound guided biopsy of right abdominal wall mass 09/07/2023-metastatic neoplasm consistent with a metastasis from anal cancer, Foundation 1-HRD signature negative, MSS, tumor mutation burden 4, BRCA 1 alteration, PD-L1 TPS 10% Cycle 1 day 1 Taxol//carboplatin 10/19/2023, day 8 Taxol 10/26/2023, day 15 Taxol held 11/02/2023 due to neutropenia Cycle 2-day 1 Taxol/carboplatin plus Retifanlimab 11/14/2023, Taxol and carboplatin dose reduced due to previous neutropenia, mild thrombocytopenia; day 8 Taxol 11/22/2023, day 15 Taxol 11/29/2023 Cycle 3-day 1 Taxol/carboplatin plus retifanlimab 12/14/2023,  day 8 Taxol  12/21/2023, day 15 Taxol 12/28/2023 CT abdomen/pelvis 01/09/2024: Decreased right anterior abdominal wall nodule, no evidence of disease progression Cycle 4-day 1 Taxol/carboplatin/retifanlimab 01/11/2024; day 8 Taxol 01/18/2024 (given peripherally)   Remote history of an anal fissure repair Tobacco use Moderate alcohol use Eczema Right breast mass- PET scan 03/16/2021 with focal FDG activity in the inner right breast Mammogram and right breast ultrasound 03/28/2021- negative Bilateral breast MRI 03/31/2021- 7 mm mass in the upper inner right breast correlating with the PET findings, no other abnormality, no adenopathy MRI guided biopsy of right breast mass 04/11/2021- fibrocystic change with sclerosing adenosis, small focus of inflammation/fibrosis suggestive of resolving fat necrosis, no evidence of malignancy--repeat bilateral breast MRI at a 72-month interval; bilateral breast MRI on 10/13/2021-no evidence of breast malignancy.  Annual screening mammography recommended.   7.  Admission with small bowel obstruction 12/03/2021 status post exploratory laparotomy and lysis of adhesions 8.  Right abdominal pain/tenderness-potentially related to the right abdominal wall mass noted on CT 08/31/2023 9.  Elevated liver enzymes-chronic, etiology unclear 10.  Hematuria-evaluated by urology 09/11/2023, cystoscopy with radiation and infection changes, no tumor seen 11.  Right neck/chest tenderness 12/21/2023-negative Doppler study.  Recurrent symptoms 01/15/2024.  Started on a course of Augmentin.  Referred for Port-A-Cath study 01/18/2024.  Disposition: Beth [name] appears stable.  She is completing cycle 4 Taxol/carboplatin/retifanlimab.  Several days following treatment she developed redness pain/tenderness/swelling at the Port-A-Cath site extending upward to the right neck.  She is completing a course of Augmentin.  The erythema is better.  She has persistent tenderness and swelling.  We decided to proceed with Taxol  today as scheduled but it will be given peripherally.  She is being referred for a Port-A-Cath dye study.  CBC and chemistry panel reviewed.  Labs adequate to proceed as above.  AST/ALT was stable chronic elevation.  She will return for follow-up and treatment as scheduled in 1 week.  We are available to see her sooner if needed.   [name] ANP/GNP-BC   01/18/2024  9:59 AM

## 2024-01-18 NOTE — Progress Notes (Addendum)
Patient seen by [name] NP today  Vitals are within treatment parameters:Yes  BP- 131/97, decreased 3 pounds Labs are within treatment parameters:  ALT- 149, Glucose- 198 Okay to treat     Treatment plan has been signed: Yes   Per physician team, Patient is ready for treatment and there are NO modifications to the treatment plan.   Per provider Treatment will be done Peripherally

## 2024-01-18 NOTE — Patient Instructions (Signed)
[facility] A DEPT OF [facility]  Discharge Instructions: Thank you for choosing [facility] to provide your oncology and hematology care.   If you have a lab appointment with the Cancer Center, please go directly to the Cancer Center and check in at the registration area.   Wear comfortable clothing and clothing appropriate for easy access to any Portacath or PICC line.   We strive to give you quality time with your provider. You may need to reschedule your appointment if you arrive late (15 or more minutes).  Arriving late affects you and other patients whose appointments are after yours.  Also, if you miss three or more appointments without notifying the office, you may be dismissed from the clinic at the provider's discretion.      For prescription refill requests, have your pharmacy contact our office and allow 72 hours for refills to be completed.    Today you received the following chemotherapy and/or immunotherapy agents: paclitaxel      To help prevent nausea and vomiting after your treatment, we encourage you to take your nausea medication as directed.  BELOW ARE SYMPTOMS THAT SHOULD BE REPORTED IMMEDIATELY: *FEVER GREATER THAN 100.4 F (38 C) OR HIGHER *CHILLS OR SWEATING *NAUSEA AND VOMITING THAT IS NOT CONTROLLED WITH YOUR NAUSEA MEDICATION *UNUSUAL SHORTNESS OF BREATH *UNUSUAL BRUISING OR BLEEDING *URINARY PROBLEMS (pain or burning when urinating, or frequent urination) *BOWEL PROBLEMS (unusual diarrhea, constipation, pain near the anus) TENDERNESS IN MOUTH AND THROAT WITH OR WITHOUT PRESENCE OF ULCERS (sore throat, sores in mouth, or a toothache) UNUSUAL RASH, SWELLING OR PAIN  UNUSUAL VAGINAL DISCHARGE OR ITCHING   Items with * indicate a potential emergency and should be followed up as soon as possible or go to the Emergency Department if any problems should occur.  Please show the CHEMOTHERAPY ALERT CARD or IMMUNOTHERAPY  ALERT CARD at check-in to the Emergency Department and triage nurse.  Should you have questions after your visit or need to cancel or reschedule your appointment, please contact [facility] - [facility]  Dept: [phone]  and follow the prompts.  Office hours are 8:00 a.m. to 4:30 p.m. Monday - Friday. Please note that voicemails left after 4:00 p.m. may not be returned until the following business day.  We are closed weekends and major holidays. You have access to a nurse at all times for urgent questions. Please call the main number to the clinic Dept: [phone] and follow the prompts.   For any non-urgent questions, you may also contact your provider using MyChart. We now offer e-Visits for anyone 16 and older to request care online for non-urgent symptoms. For details visit [email or web address].   Also download the MyChart app! Go to the app store, search "MyChart", open the app, select [place], and log in with your MyChart username and password.  Paclitaxel Injection What is this medication? PACLITAXEL (PAK li TAX el) treats some types of cancer. It works by slowing down the growth of cancer cells. This medicine may be used for other purposes; ask your health care provider or pharmacist if you have questions. COMMON BRAND NAME(S): Onxol, Taxol What should I tell my care team before I take this medication? They need to know if you have any of these conditions: Heart disease Liver disease Low white blood cell levels An unusual or allergic reaction to paclitaxel, other medications, foods, dyes, or preservatives If  you or your partner are pregnant or trying to get pregnant Breast-feeding How should I use this medication? This medication is injected into a vein. It is given by your care team in a hospital or clinic setting. Talk to your care team about the use of this medication in children. While it may be given to children for selected  conditions, precautions do apply. Overdosage: If you think you have taken too much of this medicine contact a poison control center or emergency room at once. NOTE: This medicine is only for you. Do not share this medicine with others. What if I miss a dose? Keep appointments for follow-up doses. It is important not to miss your dose. Call your care team if you are unable to keep an appointment. What may interact with this medication? Do not take this medication with any of the following: Live virus vaccines Other medications may affect the way this medication works. Talk with your care team about all of the medications you take. They may suggest changes to your treatment plan to lower the risk of side effects and to make sure your medications work as intended. This list may not describe all possible interactions. Give your health care provider a list of all the medicines, herbs, non-prescription drugs, or dietary supplements you use. Also tell them if you smoke, drink alcohol, or use illegal drugs. Some items may interact with your medicine. What should I watch for while using this medication? Your condition will be monitored carefully while you are receiving this medication. You may need blood work while taking this medication. This medication may make you feel generally unwell. This is not uncommon as chemotherapy can affect healthy cells as well as cancer cells. Report any side effects. Continue your course of treatment even though you feel ill unless your care team tells you to stop. This medication can cause serious allergic reactions. To reduce the risk, your care team may give you other medications to take before receiving this one. Be sure to follow the directions from your care team. This medication may increase your risk of getting an infection. Call your care team for advice if you get a fever, chills, sore throat, or other symptoms of a cold or flu. Do not treat yourself. Try to avoid  being around people who are sick. This medication may increase your risk to bruise or bleed. Call your care team if you notice any unusual bleeding. Be careful brushing or flossing your teeth or using a toothpick because you may get an infection or bleed more easily. If you have any dental work done, tell your dentist you are receiving this medication. Talk to your care team if you may be pregnant. Serious birth defects can occur if you take this medication during pregnancy. Talk to your care team before breastfeeding. Changes to your treatment plan may be needed. What side effects may I notice from receiving this medication? Side effects that you should report to your care team as soon as possible: Allergic reactions--skin rash, itching, hives, swelling of the face, lips, tongue, or throat Heart rhythm changes--fast or irregular heartbeat, dizziness, feeling faint or lightheaded, chest pain, trouble breathing Increase in blood pressure Infection--fever, chills, cough, sore throat, wounds that don't heal, pain or trouble when passing urine, general feeling of discomfort or being unwell Low blood pressure--dizziness, feeling faint or lightheaded, blurry vision Low red blood cell level--unusual weakness or fatigue, dizziness, headache, trouble breathing Painful swelling, warmth, or redness of the skin, blisters or  sores at the infusion site Pain, tingling, or numbness in the hands or feet Slow heartbeat--dizziness, feeling faint or lightheaded, confusion, trouble breathing, unusual weakness or fatigue Unusual bruising or bleeding Side effects that usually do not require medical attention (report to your care team if they continue or are bothersome): Diarrhea Hair loss Joint pain Loss of appetite Muscle pain Nausea Vomiting This list may not describe all possible side effects. Call your doctor for medical advice about side effects. You may report side effects to FDA at 1-800-FDA-1088. Where  should I keep my medication? This medication is given in a hospital or clinic. It will not be stored at home. NOTE: This sheet is a summary. It may not cover all possible information. If you have questions about this medicine, talk to your doctor, pharmacist, or health care provider.  2024 Elsevier/Gold Standard (2022-04-25 00:00:00)

## 2024-01-24 ENCOUNTER — Inpatient Hospital Stay: Admission: RE | Admit: 2024-01-24 | Payer: BC Managed Care – PPO | Source: Ambulatory Visit

## 2024-01-25 ENCOUNTER — Other Ambulatory Visit: Payer: Self-pay | Admitting: Nurse Practitioner

## 2024-01-25 ENCOUNTER — Inpatient Hospital Stay: Payer: BC Managed Care – PPO

## 2024-01-25 ENCOUNTER — Encounter (HOSPITAL_COMMUNITY): Payer: Self-pay | Admitting: Radiology

## 2024-01-25 ENCOUNTER — Ambulatory Visit (HOSPITAL_COMMUNITY)
Admission: RE | Admit: 2024-01-25 | Discharge: 2024-01-25 | Disposition: A | Discharge status: Home / Self Care | Payer: BC Managed Care – PPO | Source: Ambulatory Visit | Attending: Nurse Practitioner | Admitting: Nurse Practitioner

## 2024-01-25 ENCOUNTER — Other Ambulatory Visit: Payer: Self-pay | Admitting: *Deleted

## 2024-01-25 ENCOUNTER — Inpatient Hospital Stay: Payer: BC Managed Care – PPO | Admitting: Oncology

## 2024-01-25 ENCOUNTER — Other Ambulatory Visit (HOSPITAL_COMMUNITY): Payer: BC Managed Care – PPO

## 2024-01-25 ENCOUNTER — Inpatient Hospital Stay: Payer: BC Managed Care – PPO | Attending: Oncology

## 2024-01-25 VITALS — BP 133/92 | HR 100 | Temp 98.1°F | Resp 18 | Ht 67.0 in | Wt 175.6 lb

## 2024-01-25 DIAGNOSIS — Z79633 Long term (current) use of mitotic inhibitor: Secondary | ICD-10-CM | POA: Insufficient documentation

## 2024-01-25 DIAGNOSIS — Z79620 Long term (current) use of immunosuppressive biologic: Secondary | ICD-10-CM | POA: Insufficient documentation

## 2024-01-25 DIAGNOSIS — R221 Localized swelling, mass and lump, neck: Secondary | ICD-10-CM

## 2024-01-25 DIAGNOSIS — C210 Malignant neoplasm of anus, unspecified: Secondary | ICD-10-CM

## 2024-01-25 DIAGNOSIS — Z95828 Presence of other vascular implants and grafts: Secondary | ICD-10-CM

## 2024-01-25 DIAGNOSIS — R52 Pain, unspecified: Secondary | ICD-10-CM

## 2024-01-25 DIAGNOSIS — Z79630 Long term (current) use of alkylating agent: Secondary | ICD-10-CM | POA: Diagnosis not present

## 2024-01-25 DIAGNOSIS — Z5111 Encounter for antineoplastic chemotherapy: Secondary | ICD-10-CM | POA: Insufficient documentation

## 2024-01-25 HISTORY — PX: IR PATIENT EVAL TECH 0-60 MINS: IMG5564

## 2024-01-25 LAB — CBC WITH DIFFERENTIAL (CANCER CENTER ONLY)
Abs Immature Granulocytes: 0.04 10*3/uL (ref 0.00–0.07)
Basophils Absolute: 0 10*3/uL (ref 0.0–0.1)
Basophils Relative: 1 %
Eosinophils Absolute: 0 10*3/uL (ref 0.0–0.5)
Eosinophils Relative: 2 %
HCT: 34.4 % — ABNORMAL LOW (ref 36.0–46.0)
Hemoglobin: 12 g/dL (ref 12.0–15.0)
Immature Granulocytes: 2 %
Lymphocytes Relative: 17 %
Lymphs Abs: 0.4 10*3/uL — ABNORMAL LOW (ref 0.7–4.0)
MCH: 35.2 pg — ABNORMAL HIGH (ref 26.0–34.0)
MCHC: 34.9 g/dL (ref 30.0–36.0)
MCV: 100.9 fL — ABNORMAL HIGH (ref 80.0–100.0)
Monocytes Absolute: 0.2 10*3/uL (ref 0.1–1.0)
Monocytes Relative: 9 %
Neutro Abs: 1.6 10*3/uL — ABNORMAL LOW (ref 1.7–7.7)
Neutrophils Relative %: 69 %
Platelet Count: 156 10*3/uL (ref 150–400)
RBC: 3.41 MIL/uL — ABNORMAL LOW (ref 3.87–5.11)
RDW: 14.5 % (ref 11.5–15.5)
WBC Count: 2.3 10*3/uL — ABNORMAL LOW (ref 4.0–10.5)
nRBC: 0 % (ref 0.0–0.2)

## 2024-01-25 LAB — CMP (CANCER CENTER ONLY)
ALT: 105 U/L — ABNORMAL HIGH (ref 0–44)
AST: 45 U/L — ABNORMAL HIGH (ref 15–41)
Albumin: 4.4 g/dL (ref 3.5–5.0)
Alkaline Phosphatase: 84 U/L (ref 38–126)
Anion gap: 10 (ref 5–15)
BUN: 11 mg/dL (ref 6–20)
CO2: 24 mmol/L (ref 22–32)
Calcium: 9.3 mg/dL (ref 8.9–10.3)
Chloride: 101 mmol/L (ref 98–111)
Creatinine: 0.73 mg/dL (ref 0.44–1.00)
GFR, Estimated: >60 mL/min (ref >60)
Glucose, Bld: 197 mg/dL — ABNORMAL HIGH (ref 70–99)
Potassium: 3.8 mmol/L (ref 3.5–5.1)
Sodium: 135 mmol/L (ref 135–145)
Total Bilirubin: 0.7 mg/dL (ref 0.0–1.2)
Total Protein: 7 g/dL (ref 6.5–8.1)

## 2024-01-25 MED ORDER — HEPARIN SOD (PORK) LOCK FLUSH 100 UNIT/ML IV SOLN
INTRAVENOUS | Status: AC
  Filled 2024-01-25: qty 5

## 2024-01-25 MED ORDER — DIPHENHYDRAMINE HCL 50 MG/ML IJ SOLN
25.0000 mg | Freq: Once | INTRAMUSCULAR | Status: AC
  Administered 2024-01-25: 25 mg via INTRAVENOUS
  Filled 2024-01-25: qty 1

## 2024-01-25 MED ORDER — SODIUM CHLORIDE 0.9 % IV SOLN: INTRAVENOUS | Status: DC

## 2024-01-25 MED ORDER — SODIUM CHLORIDE 0.9 % IV SOLN
60.0000 mg/m2 | Freq: Once | INTRAVENOUS | Status: AC
  Administered 2024-01-25: 120 mg via INTRAVENOUS
  Filled 2024-01-25: qty 20

## 2024-01-25 MED ORDER — DEXAMETHASONE SODIUM PHOSPHATE 10 MG/ML IJ SOLN
5.0000 mg | Freq: Once | INTRAMUSCULAR | Status: AC
  Administered 2024-01-25: 5 mg via INTRAVENOUS
  Filled 2024-01-25: qty 1

## 2024-01-25 MED ORDER — FAMOTIDINE IN NACL 20-0.9 MG/50ML-% IV SOLN
20.0000 mg | Freq: Once | INTRAVENOUS | Status: AC
  Administered 2024-01-25: 20 mg via INTRAVENOUS
  Filled 2024-01-25: qty 50

## 2024-01-25 NOTE — Patient Instructions (Signed)
 [facility]  Discharge Instructions: Thank you for choosing [facility] to provide your oncology and hematology care.   If you have a lab appointment with the Cancer Center, please go directly to the Cancer Center and check in at the registration area.   Wear comfortable clothing and clothing appropriate for easy access to any Portacath or PICC line.   We strive to give you quality time with your provider. You may need to reschedule your appointment if you arrive late (15 or more minutes).  Arriving late affects you and other patients whose appointments are after yours.  Also, if you miss three or more appointments without notifying the office, you may be dismissed from the clinic at the provider's discretion.      For prescription refill requests, have your pharmacy contact our office and allow 72 hours for refills to be completed.    Today you received the following chemotherapy and/or immunotherapy agents: paclitaxel      To help prevent nausea and vomiting after your treatment, we encourage you to take your nausea medication as directed.  BELOW ARE SYMPTOMS THAT SHOULD BE REPORTED IMMEDIATELY: *FEVER GREATER THAN 100.4 F (38 C) OR HIGHER *CHILLS OR SWEATING *NAUSEA AND VOMITING THAT IS NOT CONTROLLED WITH YOUR NAUSEA MEDICATION *UNUSUAL SHORTNESS OF BREATH *UNUSUAL BRUISING OR BLEEDING *URINARY PROBLEMS (pain or burning when urinating, or frequent urination) *BOWEL PROBLEMS (unusual diarrhea, constipation, pain near the anus) TENDERNESS IN MOUTH AND THROAT WITH OR WITHOUT PRESENCE OF ULCERS (sore throat, sores in mouth, or a toothache) UNUSUAL RASH, SWELLING OR PAIN  UNUSUAL VAGINAL DISCHARGE OR ITCHING   Items with * indicate a potential emergency and should be followed up as soon as possible or go to the Emergency Department if any problems should occur.  Please show the CHEMOTHERAPY ALERT CARD or IMMUNOTHERAPY  ALERT CARD at check-in to the Emergency Department and triage nurse.  Should you have questions after your visit or need to cancel or reschedule your appointment, please contact [facility]  Dept: [phone]  and follow the prompts.  Office hours are 8:00 a.m. to 4:30 p.m. Monday - Friday. Please note that voicemails left after 4:00 p.m. may not be returned until the following business day.  We are closed weekends and major holidays. You have access to a nurse at all times for urgent questions. Please call the main number to the clinic Dept: [phone] and follow the prompts.   For any non-urgent questions, you may also contact your provider using MyChart. We now offer e-Visits for anyone 16 and older to request care online for non-urgent symptoms. For details visit [email or web address].   Also download the MyChart app! Go to the app store, search "MyChart", open the app, select [facility], and log in with your MyChart username and password.  Paclitaxel Injection What is this medication? PACLITAXEL (PAK li TAX el) treats some types of cancer. It works by slowing down the growth of cancer cells. This medicine may be used for other purposes; ask your health care provider or pharmacist if you have questions. COMMON BRAND NAME(S): Onxol, Taxol What should I tell my care team before I take this medication? They need to know if you have any of these conditions: Heart disease Liver disease Low white blood cell levels An unusual or allergic reaction to paclitaxel, other medications, foods, dyes, or preservatives If  you or your partner are pregnant or trying to get pregnant Breast-feeding How should I use this medication? This medication is injected into a vein. It is given by your care team in a hospital or clinic setting. Talk to your care team about the use of this medication in children. While it may be given to children for selected  conditions, precautions do apply. Overdosage: If you think you have taken too much of this medicine contact a poison control center or emergency room at once. NOTE: This medicine is only for you. Do not share this medicine with others. What if I miss a dose? Keep appointments for follow-up doses. It is important not to miss your dose. Call your care team if you are unable to keep an appointment. What may interact with this medication? Do not take this medication with any of the following: Live virus vaccines Other medications may affect the way this medication works. Talk with your care team about all of the medications you take. They may suggest changes to your treatment plan to lower the risk of side effects and to make sure your medications work as intended. This list may not describe all possible interactions. Give your health care provider a list of all the medicines, herbs, non-prescription drugs, or dietary supplements you use. Also tell them if you smoke, drink alcohol, or use illegal drugs. Some items may interact with your medicine. What should I watch for while using this medication? Your condition will be monitored carefully while you are receiving this medication. You may need blood work while taking this medication. This medication may make you feel generally unwell. This is not uncommon as chemotherapy can affect healthy cells as well as cancer cells. Report any side effects. Continue your course of treatment even though you feel ill unless your care team tells you to stop. This medication can cause serious allergic reactions. To reduce the risk, your care team may give you other medications to take before receiving this one. Be sure to follow the directions from your care team. This medication may increase your risk of getting an infection. Call your care team for advice if you get a fever, chills, sore throat, or other symptoms of a cold or flu. Do not treat yourself. Try to avoid  being around people who are sick. This medication may increase your risk to bruise or bleed. Call your care team if you notice any unusual bleeding. Be careful brushing or flossing your teeth or using a toothpick because you may get an infection or bleed more easily. If you have any dental work done, tell your dentist you are receiving this medication. Talk to your care team if you may be pregnant. Serious birth defects can occur if you take this medication during pregnancy. Talk to your care team before breastfeeding. Changes to your treatment plan may be needed. What side effects may I notice from receiving this medication? Side effects that you should report to your care team as soon as possible: Allergic reactions--skin rash, itching, hives, swelling of the face, lips, tongue, or throat Heart rhythm changes--fast or irregular heartbeat, dizziness, feeling faint or lightheaded, chest pain, trouble breathing Increase in blood pressure Infection--fever, chills, cough, sore throat, wounds that don't heal, pain or trouble when passing urine, general feeling of discomfort or being unwell Low blood pressure--dizziness, feeling faint or lightheaded, blurry vision Low red blood cell level--unusual weakness or fatigue, dizziness, headache, trouble breathing Painful swelling, warmth, or redness of the skin, blisters or  sores at the infusion site Pain, tingling, or numbness in the hands or feet Slow heartbeat--dizziness, feeling faint or lightheaded, confusion, trouble breathing, unusual weakness or fatigue Unusual bruising or bleeding Side effects that usually do not require medical attention (report to your care team if they continue or are bothersome): Diarrhea Hair loss Joint pain Loss of appetite Muscle pain Nausea Vomiting This list may not describe all possible side effects. Call your doctor for medical advice about side effects. You may report side effects to FDA at 1-800-FDA-1088. Where  should I keep my medication? This medication is given in a hospital or clinic. It will not be stored at home. NOTE: This sheet is a summary. It may not cover all possible information. If you have questions about this medicine, talk to your doctor, pharmacist, or health care provider.  2024 Elsevier/Gold Standard (2022-04-25 00:00:00)

## 2024-01-25 NOTE — Progress Notes (Signed)
 Interventional Radiology Brief Note:  Patient assessed for Port site infection currently being treated by the [facility].  Patient with several day history of tenderness and redness at her Port site for which he has been taking antibiotics.  She presents to [facility] today for evaluation.   Port site found intact, clean, and dry. There is no erythema.  No rash.  Incision is closed and well-healed.  She has full ROM in hands, arm, shoulders, neck without pai.  No swelling or tenderness.    Port accessed by IR and found to flush and aspirate easily.  Reviewed findings with Dr. Johann.  No injection performed. Patient deaccessed and sent for OP appt.   Maysun Meditz, MS RD PA-C

## 2024-01-25 NOTE — Procedures (Signed)
  Interventional Radiology Brief Note:   Patient assessed for Port site infection currently being treated by the [facility].  Patient with several day history of tenderness and redness at her Port site for which he has been taking antibiotics.  She presents to [facility] today for evaluation.    Port site found intact, clean, and dry. There is no erythema.  No rash.  Incision is closed and well-healed.  She has full ROM in hands, arm, shoulders, neck without pai.  No swelling or tenderness.     Port accessed by IR and found to flush and aspirate easily.  Reviewed findings with Dr. Johann.  No injection performed. Patient deaccessed and sent for OP appt.    Beth Matthews, MS RD PA-C

## 2024-01-25 NOTE — Progress Notes (Signed)
 Patient seen by Dr. Arley Hof today  Vitals are within treatment parameters:Yes   Labs are within treatment parameters: No (Please specify and give further instructions.)  ANC 1.6--OK to proceed ALT 105--OK to proceed  Treatment plan has been signed: Yes   Per physician team, Patient is ready for treatment and there are NO modifications to the treatment plan. Please treat peripherally today again. Will re-schedule her for dye study to r/o catheter fracture.

## 2024-01-25 NOTE — Progress Notes (Signed)
 [facility] OFFICE PROGRESS NOTE   Diagnosis: Anal cancer  INTERVAL HISTORY:   [name] returns as scheduled.  She completed day 8 Taxol  on 01/18/2024.  No symptom of allergic reaction.  No numbness.  Abdominal pain remains improved. She reports the right Port-A-Cath site is less tender.  She is using ice.  She saw dimensional radiology 01/16/2024.  The erythema at the port site had improved when she was seen in radiology.  She was referred for a dye study earlier today.  The dye study was not performed.  The port was accessed and then deaccessed.  Objective:  Vital signs in last 24 hours:  Blood pressure (!) 133/92, pulse 100, temperature 98.1 F (36.7 C), temperature source Temporal, resp. rate 18, height 5' 7 (1.702 m), weight 175 lb 9.6 oz (79.7 kg), last menstrual period 03/13/2012, SpO2 100%.    HEENT: No thrush or ulcers Resp: Lungs clear bilaterally Cardio: Regular rate and rhythm GI: No mass, no hepatosplenomegaly, left lower quadrant colostomy Vascular: No leg edema   Portacath/PICC-pain erythema surrounding the port Lab Results:  Lab Results  Component Value Date   WBC 2.3 (L) 01/25/2024   HGB 12.0 01/25/2024   HCT 34.4 (L) 01/25/2024   MCV 100.9 (H) 01/25/2024   PLT 156 01/25/2024   NEUTROABS 1.6 (L) 01/25/2024    CMP  Lab Results  Component Value Date   NA 137 01/18/2024   K 4.1 01/18/2024   CL 104 01/18/2024   CO2 26 01/18/2024   GLUCOSE 198 (H) 01/18/2024   BUN 9 01/18/2024   CREATININE 0.52 01/18/2024   CALCIUM  9.6 01/18/2024   PROT 6.9 01/18/2024   ALBUMIN  4.1 01/18/2024   AST 71 (H) 01/18/2024   ALT 149 (H) 01/18/2024   ALKPHOS 88 01/18/2024   BILITOT 0.6 01/18/2024   GFRNONAA >60 01/18/2024   GFRAA 106 11/29/2020    Lab Results  Component Value Date   CEA 1.39 10/10/2023      Medications: I have reviewed the patient's current medications.   Assessment/Plan: Squamous cell carcinoma of the anal canal Colonoscopy  01/28/2021-rectal mass extending to the outside, palpable on exam-biopsy revealed high-grade squamous intraepithelial lesion (AIN 3/carcinoma in situ) Exam under anesthesia with transanal excision of an anal canal mass 02/25/2021-frozen section consistent with squamous cell carcinoma, 4 x 4 centimeter sessile anal canal mass extending to the anal verge, right lateral and posterior, 30% circumferential, fixed to the anal sphincter complex; anal rectal mass with mixed high-grade neuroendocrine squamous cell carcinoma; posterior anal tag with squamous cell carcinoma; left labial lesion-condyloma acuminata CTs 03/07/2021-ill-definition of tissue planes along the anus with abnormal soft tissue prominence posteriorly along the anal canal suspicious for mass.  Small adjacent lower perirectal lymph nodes in addition to a 0.5 cm sacral node.  Focal enhancing lesion in the right hepatic lobe 1.2 x 0.8 x 0.9 cm.  2.1 x 1.5 cm cystic lesion right ovary with thin enhancing rim. PET scan 03/16/2021-hypermetabolic mass within the anal canal.  No definite evidence of local or distant metastatic disease.  7 mm left submandibular lymph node with mild FDG uptake, likely reactive.  Focal site of FDG uptake within the inner quadrant of the right breast. Radiation 03/21/2021-04/27/2021 Cycle one 5-FU/mitomycin -C 03/21/2021 Cycle two 5-FU/Mitomycin -C 04/18/2021, 5-FU dose reduced secondary to mucositis Anoscopy by Dr. Sheldon 07/20/2021-divet in the right posterior lateral aspect with some right anterior and right lateral folds consistent with radiation treatment.  No ulceration.  Not fully resolved but  markedly shrunk down. Anorectal mass 09/30/2021 examination under anesthesia, biopsy-poorly differentiated carcinoma-basaloid squamous carcinoma and high-grade neuroendocrine carcinoma, margins positive APR/and vertical rectus abdominis flap 11/03/2021, invasive poorly differentiated carcinoma, negative resection margins, 0/10 nodes, treatment  response score 2,ypT1,ypN0 CT abdomen/pelvis 12/03/2021-high-grade small bowel obstruction transitioning in the left lower abdomen.  Presacral rim-enhancing 4.4 x 2.8 x 3.8 cm fluid collection.  No air in the collection.  Prior study described a potential abnormality in the right lobe of the liver, not seen on current study.  Cystic lesion of the right ovary resolved. CT chest 12/07/2021-interval development of bilateral trace pleural effusions. CTs 12/04/2022-new 2.6 x 2.5 cm right lower lobe nodule PET 12/21/2022-FDG avid right lower lobe nodule, no other evidence of metastatic disease 12/29/2022 right lower lobectomy, lymph node dissection-poorly differentiated carcinoma consistent with metastasis, 4.8 x 2.9 x 2.6 cm, margins free, 7 lymph nodes negative for carcinoma; cytohistomorphology correlation supports the current tumor being a metastasis from the previously diagnosed anal carcinoma CT chest 04/17/2023-interval right lower lobectomy with resection of right lung mass, pleural thickening and small right pleural effusion, no new mass or lymph node enlargement CTs 07/20/2023-diminished postoperative right pleural effusion.  Unchanged postoperative findings status post abdominoperineal resection with left lower quadrant and colostomy.  No evidence of metastatic disease. CT abdomen/pelvis 08/31/2023-heterogenous soft tissue nodule in the right upper quadrant anterior abdominal wall, no evidence of metastatic disease CT/ultrasound guided biopsy of right abdominal wall mass 09/07/2023-metastatic neoplasm consistent with a metastasis from anal cancer, Foundation 1-HRD signature negative, MSS, tumor mutation burden 4, BRCA 1 alteration, PD-L1 TPS 10% Cycle 1 day 1 Taxol //carboplatin  10/19/2023, day 8 Taxol  10/26/2023, day 15 Taxol  held 11/02/2023 due to neutropenia Cycle 2-day 1 Taxol /carboplatin  plus Retifanlimab  11/14/2023, Taxol  and carboplatin  dose reduced due to previous neutropenia, mild thrombocytopenia; day  8 Taxol  11/22/2023, day 15 Taxol  11/29/2023 Cycle 3-day 1 Taxol /carboplatin  plus retifanlimab  12/14/2023, day 8 Taxol  12/21/2023, day 15 Taxol  12/28/2023 CT abdomen/pelvis 01/09/2024: Decreased right anterior abdominal wall nodule, no evidence of disease progression Cycle 4-day 1 Taxol /carboplatin /retifanlimab  01/11/2024; day 8 Taxol  01/18/2024 (given peripherally), day 15 Taxol  01/25/2024 (given peripherally   Remote history of an anal fissure repair Tobacco use Moderate alcohol use Eczema Right breast mass- PET scan 03/16/2021 with focal FDG activity in the inner right breast Mammogram and right breast ultrasound 03/28/2021- negative Bilateral breast MRI 03/31/2021- 7 mm mass in the upper inner right breast correlating with the PET findings, no other abnormality, no adenopathy MRI guided biopsy of right breast mass 04/11/2021- fibrocystic change with sclerosing adenosis, small focus of inflammation/fibrosis suggestive of resolving fat necrosis, no evidence of malignancy--repeat bilateral breast MRI at a 73-month interval; bilateral breast MRI on 10/13/2021-no evidence of breast malignancy.  Annual screening mammography recommended.   7.  Admission with small bowel obstruction 12/03/2021 status post exploratory laparotomy and lysis of adhesions 8.  Right abdominal pain/tenderness-potentially related to the right abdominal wall mass noted on CT 08/31/2023 9.  Elevated liver enzymes-chronic, etiology unclear 10.  Hematuria-evaluated by urology 09/11/2023, cystoscopy with radiation and infection changes, no tumor seen 11.  Right neck/chest tenderness 12/21/2023-negative Doppler study.  Recurrent symptoms 01/15/2024.  Started on a course of Augmentin .  Referred for Port-A-Cath study 01/18/2024.    Disposition: Beth [name] appears stable.  She will complete day 15 Taxol  today.  She will return for an office visit and cycle 5 chemotherapy in 2 weeks.  We remain concerned there could be a fracture in the Port-A-Cath.   She will be treated  with peripheral IV access again today.  We will reschedule a dye study and interventional radiology.  She will complete the course of Augmentin  tomorrow.  Arley Hof, MD  01/25/2024  11:15 AM

## 2024-01-28 ENCOUNTER — Other Ambulatory Visit: Payer: Self-pay

## 2024-01-29 ENCOUNTER — Other Ambulatory Visit (HOSPITAL_COMMUNITY): Payer: BC Managed Care – PPO

## 2024-02-01 ENCOUNTER — Other Ambulatory Visit: Payer: Self-pay | Admitting: Oncology

## 2024-02-01 ENCOUNTER — Ambulatory Visit (HOSPITAL_COMMUNITY)
Admission: RE | Admit: 2024-02-01 | Discharge: 2024-02-01 | Disposition: A | Discharge status: Home / Self Care | Payer: BC Managed Care – PPO | Source: Ambulatory Visit | Attending: Oncology | Admitting: Oncology

## 2024-02-01 DIAGNOSIS — Z452 Encounter for adjustment and management of vascular access device: Secondary | ICD-10-CM | POA: Insufficient documentation

## 2024-02-01 DIAGNOSIS — C210 Malignant neoplasm of anus, unspecified: Secondary | ICD-10-CM | POA: Diagnosis not present

## 2024-02-01 DIAGNOSIS — Z95828 Presence of other vascular implants and grafts: Secondary | ICD-10-CM

## 2024-02-01 HISTORY — PX: IR CV LINE INJECTION: IMG2294

## 2024-02-01 MED ORDER — IOHEXOL 300 MG/ML  SOLN
50.0000 mL | Freq: Once | INTRAMUSCULAR | Status: AC | PRN
Start: 2024-02-01 — End: 2024-02-01
  Administered 2024-02-01: 10 mL via INTRAVENOUS

## 2024-02-01 MED ORDER — HEPARIN SOD (PORK) LOCK FLUSH 100 UNIT/ML IV SOLN
INTRAVENOUS | Status: AC
  Filled 2024-02-01: qty 5

## 2024-02-01 MED ORDER — HEPARIN SOD (PORK) LOCK FLUSH 100 UNIT/ML IV SOLN
500.0000 [IU] | Freq: Once | INTRAVENOUS | Status: AC
  Administered 2024-02-01: 500 [IU] via INTRAVENOUS

## 2024-02-01 NOTE — Addendum Note (Signed)
Encounter addended by: Barrett Shell, RT on: 02/01/2024 8:32 AM  Actions taken: Imaging Exam ended

## 2024-02-07 ENCOUNTER — Other Ambulatory Visit: Payer: Self-pay | Admitting: Oncology

## 2024-02-08 ENCOUNTER — Inpatient Hospital Stay: Payer: BC Managed Care – PPO

## 2024-02-08 ENCOUNTER — Other Ambulatory Visit: Payer: Self-pay | Admitting: Nurse Practitioner

## 2024-02-08 ENCOUNTER — Encounter: Payer: Self-pay | Admitting: Nurse Practitioner

## 2024-02-08 ENCOUNTER — Inpatient Hospital Stay: Payer: BC Managed Care – PPO | Admitting: Nurse Practitioner

## 2024-02-08 ENCOUNTER — Other Ambulatory Visit (HOSPITAL_BASED_OUTPATIENT_CLINIC_OR_DEPARTMENT_OTHER): Payer: Self-pay

## 2024-02-08 VITALS — BP 123/91 | HR 100 | Temp 98.1°F | Resp 18 | Ht 67.0 in | Wt 175.1 lb

## 2024-02-08 VITALS — BP 124/90 | HR 91 | Temp 98.2°F | Resp 20

## 2024-02-08 DIAGNOSIS — C210 Malignant neoplasm of anus, unspecified: Secondary | ICD-10-CM

## 2024-02-08 LAB — CMP (CANCER CENTER ONLY)
ALT: 65 U/L — ABNORMAL HIGH (ref 0–44)
AST: 37 U/L (ref 15–41)
Albumin: 4.4 g/dL (ref 3.5–5.0)
Alkaline Phosphatase: 79 U/L (ref 38–126)
Anion gap: 8 (ref 5–15)
BUN: 13 mg/dL (ref 6–20)
CO2: 26 mmol/L (ref 22–32)
Calcium: 9.5 mg/dL (ref 8.9–10.3)
Chloride: 101 mmol/L (ref 98–111)
Creatinine: 0.68 mg/dL (ref 0.44–1.00)
GFR, Estimated: >60 mL/min (ref >60)
Glucose, Bld: 232 mg/dL — ABNORMAL HIGH (ref 70–99)
Potassium: 4 mmol/L (ref 3.5–5.1)
Sodium: 135 mmol/L (ref 135–145)
Total Bilirubin: 0.9 mg/dL (ref 0.0–1.2)
Total Protein: 7 g/dL (ref 6.5–8.1)

## 2024-02-08 LAB — CBC WITH DIFFERENTIAL (CANCER CENTER ONLY)
Abs Immature Granulocytes: 0.03 10*3/uL (ref 0.00–0.07)
Basophils Absolute: 0 10*3/uL (ref 0.0–0.1)
Basophils Relative: 1 %
Eosinophils Absolute: 0.1 10*3/uL (ref 0.0–0.5)
Eosinophils Relative: 2 %
HCT: 35.6 % — ABNORMAL LOW (ref 36.0–46.0)
Hemoglobin: 12 g/dL (ref 12.0–15.0)
Immature Granulocytes: 1 %
Lymphocytes Relative: 13 %
Lymphs Abs: 0.6 10*3/uL — ABNORMAL LOW (ref 0.7–4.0)
MCH: 34.8 pg — ABNORMAL HIGH (ref 26.0–34.0)
MCHC: 33.7 g/dL (ref 30.0–36.0)
MCV: 103.2 fL — ABNORMAL HIGH (ref 80.0–100.0)
Monocytes Absolute: 0.7 10*3/uL (ref 0.1–1.0)
Monocytes Relative: 16 %
Neutro Abs: 3.1 10*3/uL (ref 1.7–7.7)
Neutrophils Relative %: 67 %
Platelet Count: 119 10*3/uL — ABNORMAL LOW (ref 150–400)
RBC: 3.45 MIL/uL — ABNORMAL LOW (ref 3.87–5.11)
RDW: 15.1 % (ref 11.5–15.5)
WBC Count: 4.6 10*3/uL (ref 4.0–10.5)
nRBC: 0 % (ref 0.0–0.2)

## 2024-02-08 MED ORDER — SODIUM CHLORIDE 0.9 % IV SOLN: INTRAVENOUS | Status: DC

## 2024-02-08 MED ORDER — PALONOSETRON HCL INJECTION 0.25 MG/5ML
0.2500 mg | Freq: Once | INTRAVENOUS | Status: AC
  Administered 2024-02-08: 0.25 mg via INTRAVENOUS
  Filled 2024-02-08: qty 5

## 2024-02-08 MED ORDER — DIPHENHYDRAMINE HCL 50 MG/ML IJ SOLN
25.0000 mg | Freq: Once | INTRAMUSCULAR | Status: AC
  Administered 2024-02-08: 25 mg via INTRAVENOUS
  Filled 2024-02-08: qty 1

## 2024-02-08 MED ORDER — HEPARIN SOD (PORK) LOCK FLUSH 100 UNIT/ML IV SOLN
500.0000 [IU] | Freq: Once | INTRAVENOUS | Status: AC | PRN
Start: 2024-02-08 — End: 2024-02-08
  Administered 2024-02-08: 500 [IU]

## 2024-02-08 MED ORDER — SODIUM CHLORIDE 0.9 % IV SOLN
500.0000 mg | Freq: Once | INTRAVENOUS | Status: AC
  Administered 2024-02-08: 500 mg via INTRAVENOUS
  Filled 2024-02-08: qty 20

## 2024-02-08 MED ORDER — DEXAMETHASONE SODIUM PHOSPHATE 10 MG/ML IJ SOLN
5.0000 mg | Freq: Once | INTRAMUSCULAR | Status: AC
  Administered 2024-02-08: 5 mg via INTRAVENOUS
  Filled 2024-02-08: qty 1

## 2024-02-08 MED ORDER — SODIUM CHLORIDE 0.9 % IV SOLN
150.0000 mg | Freq: Once | INTRAVENOUS | Status: AC
  Administered 2024-02-08: 150 mg via INTRAVENOUS
  Filled 2024-02-08: qty 5

## 2024-02-08 MED ORDER — OXYCODONE HCL 5 MG PO TABS
2.5000 mg | ORAL_TABLET | ORAL | 0 refills | Status: DC | PRN
  Filled 2024-02-08: qty 75, 13d supply, fill #0

## 2024-02-08 MED ORDER — FAMOTIDINE IN NACL 20-0.9 MG/50ML-% IV SOLN
20.0000 mg | Freq: Once | INTRAVENOUS | Status: AC
  Administered 2024-02-08: 20 mg via INTRAVENOUS
  Filled 2024-02-08: qty 50

## 2024-02-08 MED ORDER — SODIUM CHLORIDE 0.9 % IV SOLN
384.0000 mg | Freq: Once | INTRAVENOUS | Status: AC
  Administered 2024-02-08: 380 mg via INTRAVENOUS
  Filled 2024-02-08: qty 38

## 2024-02-08 MED ORDER — SODIUM CHLORIDE 0.9% FLUSH
10.0000 mL | INTRAVENOUS | Status: DC | PRN
  Administered 2024-02-08 (×2): 10 mL

## 2024-02-08 MED ORDER — SODIUM CHLORIDE 0.9% FLUSH: 3.0000 mL | INTRAVENOUS | Status: DC | PRN

## 2024-02-08 MED ORDER — SODIUM CHLORIDE 0.9 % IV SOLN
60.0000 mg/m2 | Freq: Once | INTRAVENOUS | Status: AC
  Administered 2024-02-08: 120 mg via INTRAVENOUS
  Filled 2024-02-08: qty 20

## 2024-02-08 NOTE — Progress Notes (Signed)
Patient seen by [name] NP today  Vitals are within treatment parameters:Yes  B/P 123/91  Labs are within treatment parameters: Yes   Treatment plan has been signed: Yes   Per physician team, Patient is ready for treatment and there are NO modifications to the treatment plan.

## 2024-02-08 NOTE — Patient Instructions (Signed)

## 2024-02-08 NOTE — Progress Notes (Signed)
[facility] OFFICE PROGRESS NOTE   Diagnosis: Anal cancer  INTERVAL HISTORY:   Beth [name] returns as scheduled.  She completed cycle 4-day 15 Taxol 01/25/2024.Marland Kitchen  No mouth sores.  Single sore left nasal passage.  No mouth sores.  No numbness or tingling in the hands or feet.  Fingernails and toenails feel "sore".  No drainage.  No fever.  No pain associated with the Port-A-Cath.  Last few days she has noted "spasms" intermittently at the right abdomen.  Objective:  Vital signs in last 24 hours:  Blood pressure (!) 123/91, pulse 100, temperature 98.1 F (36.7 C), temperature source Temporal, resp. rate 18, height 5\' 7"  (1.702 m), weight 175 lb 1.6 oz (79.4 kg), last menstrual period 03/13/2012, SpO2 98%.    HEENT: No thrush or ulcers. Resp: Lungs clear bilaterally. Cardio: Regular rate and rhythm. GI: Abdomen soft and nontender.  No hepatosplenomegaly.  No mass. Vascular: No leg edema. Skin: Fingernails with discoloration.  No rash. Port-A-Cath without erythema.  Lab Results:  Lab Results  Component Value Date   WBC 4.6 02/08/2024   HGB 12.0 02/08/2024   HCT 35.6 (L) 02/08/2024   MCV 103.2 (H) 02/08/2024   PLT 119 (L) 02/08/2024   NEUTROABS 3.1 02/08/2024    Imaging:  No results found.  Medications: I have reviewed the patient's current medications.  Assessment/Plan: Squamous cell carcinoma of the anal canal Colonoscopy 01/28/2021-rectal mass extending to the "outside", palpable on exam-biopsy revealed high-grade squamous intraepithelial lesion (AIN 3/carcinoma in situ) Exam under anesthesia with transanal excision of an anal canal mass 02/25/2021-frozen section consistent with squamous cell carcinoma, 4 x 4 centimeter sessile anal canal mass extending to the anal verge, right lateral and posterior, 30% circumferential, fixed to the anal sphincter complex; anal rectal mass with mixed high-grade neuroendocrine squamous cell carcinoma; posterior anal tag with  squamous cell carcinoma; left labial lesion-condyloma acuminata CTs 03/07/2021-ill-definition of tissue planes along the anus with abnormal soft tissue prominence posteriorly along the anal canal suspicious for mass.  Small adjacent lower perirectal lymph nodes in addition to a 0.5 cm sacral node.  Focal enhancing lesion in the right hepatic lobe 1.2 x 0.8 x 0.9 cm.  2.1 x 1.5 cm cystic lesion right ovary with thin enhancing rim. PET scan 03/16/2021-hypermetabolic mass within the anal canal.  No definite evidence of local or distant metastatic disease.  7 mm left submandibular lymph node with mild FDG uptake, likely reactive.  Focal site of FDG uptake within the inner quadrant of the right breast. Radiation 03/21/2021-04/27/2021 Cycle one 5-FU/mitomycin-C 03/21/2021 Cycle two 5-FU/Mitomycin-C 04/18/2021, 5-FU dose reduced secondary to mucositis Anoscopy by Dr. Michaell Cowing 07/20/2021-divet in the right posterior lateral aspect with some right anterior and right lateral folds consistent with radiation treatment.  No ulceration.  Not fully resolved but markedly shrunk down. Anorectal mass 09/30/2021 examination under anesthesia, biopsy-poorly differentiated carcinoma-basaloid squamous carcinoma and high-grade neuroendocrine carcinoma, margins positive APR/and vertical rectus abdominis flap 11/03/2021, invasive poorly differentiated carcinoma, negative resection margins, 0/10 nodes, treatment response score 2,ypT1,ypN0 CT abdomen/pelvis 12/03/2021-high-grade small bowel obstruction transitioning in the left lower abdomen.  Presacral rim-enhancing 4.4 x 2.8 x 3.8 cm fluid collection.  No air in the collection.  Prior study described a potential abnormality in the right lobe of the liver, not seen on current study.  Cystic lesion of the right ovary resolved. CT chest 12/07/2021-interval development of bilateral trace pleural effusions. CTs 12/04/2022-new 2.6 x 2.5 cm right lower lobe nodule PET 12/21/2022-FDG avid right  lower  lobe nodule, no other evidence of metastatic disease 12/29/2022 right lower lobectomy, lymph node dissection-poorly differentiated carcinoma consistent with metastasis, 4.8 x 2.9 x 2.6 cm, margins free, 7 lymph nodes negative for carcinoma; cytohistomorphology correlation supports the current tumor being a metastasis from the previously diagnosed anal carcinoma CT chest 04/17/2023-interval right lower lobectomy with resection of right lung mass, pleural thickening and small right pleural effusion, no new mass or lymph node enlargement CTs 07/20/2023-diminished postoperative right pleural effusion.  Unchanged postoperative findings status post abdominoperineal resection with left lower quadrant and colostomy.  No evidence of metastatic disease. CT abdomen/pelvis 08/31/2023-heterogenous soft tissue nodule in the right upper quadrant anterior abdominal wall, no evidence of metastatic disease CT/ultrasound guided biopsy of right abdominal wall mass 09/07/2023-metastatic neoplasm consistent with a metastasis from anal cancer, Foundation 1-HRD signature negative, MSS, tumor mutation burden 4, BRCA 1 alteration, PD-L1 TPS 10% Cycle 1 day 1 Taxol//carboplatin 10/19/2023, day 8 Taxol 10/26/2023, day 15 Taxol held 11/02/2023 due to neutropenia Cycle 2-day 1 Taxol/carboplatin plus Retifanlimab 11/14/2023, Taxol and carboplatin dose reduced due to previous neutropenia, mild thrombocytopenia; day 8 Taxol 11/22/2023, day 15 Taxol 11/29/2023 Cycle 3-day 1 Taxol/carboplatin plus retifanlimab 12/14/2023, day 8 Taxol 12/21/2023, day 15 Taxol 12/28/2023 CT abdomen/pelvis 01/09/2024: Decreased right anterior abdominal wall nodule, no evidence of disease progression Cycle 4-day 1 Taxol/carboplatin/retifanlimab 01/11/2024; day 8 Taxol 01/18/2024 (given peripherally), day 15 Taxol 01/25/2024 (given peripherally) Cycle 5-day 1 Taxol/carboplatin/retifanlimab 02/08/2024   Remote history of an anal fissure repair Tobacco use Moderate alcohol  use Eczema Right breast mass- PET scan 03/16/2021 with focal FDG activity in the inner right breast Mammogram and right breast ultrasound 03/28/2021- negative Bilateral breast MRI 03/31/2021- 7 mm mass in the upper inner right breast correlating with the PET findings, no other abnormality, no adenopathy MRI guided biopsy of right breast mass 04/11/2021- fibrocystic change with sclerosing adenosis, small focus of inflammation/fibrosis suggestive of resolving fat necrosis, no evidence of malignancy--repeat bilateral breast MRI at a 43-month interval; bilateral breast MRI on 10/13/2021-no evidence of breast malignancy.  Annual screening mammography recommended.   7.  Admission with small bowel obstruction 12/03/2021 status post exploratory laparotomy and lysis of adhesions 8.  Right abdominal pain/tenderness-potentially related to the right abdominal wall mass noted on CT 08/31/2023 9.  Elevated liver enzymes-chronic, etiology unclear 10.  Hematuria-evaluated by urology 09/11/2023, cystoscopy with radiation and infection changes, no tumor seen 11.  Right neck/chest tenderness 12/21/2023-negative Doppler study.  Recurrent symptoms 01/15/2024.  Started on a course of Augmentin.  Referred for Port-A-Cath study 01/18/2024-study completed 02/01/2024, intact tunneled right internal jugular vein Port-A-Cath with catheter tip located just below the SVC/RA junction.  No evidence of catheter rupture, contrast extravasation or fibrin sheath.  Disposition: Beth [name] appears stable.  She has completed 4 cycles of Taxol/carboplatin/retifanlimab.  She is tolerating treatment well.  Plan to proceed with cycle 5-day 1 today as scheduled.  CBC and chemistry panel reviewed.  Labs adequate to proceed as above.  Recent Port-A-Cath dye study was unremarkable.  Resume use of Port-A-Cath today.  The nail discoloration is likely due to chemotherapy, specifically Taxol.  She will return for follow-up and treatment in 1 week.  We are  available to see her sooner if needed.    [name] ANP/GNP-BC   02/08/2024  9:52 AM

## 2024-02-08 NOTE — Patient Instructions (Addendum)
[facility] A DE[facility] OF [facility]  Discharge Instructions: Thank you for choosing [facility] to provide your oncology and hematology care.   If you have a lab appointment with the Cancer Center, please go directly to the Cancer Center and check in at the registration area.   Wear comfortable clothing and clothing appropriate for easy access to any Portacath or PICC line.   We strive to give you quality time with your provider. You may need to reschedule your appointment if you arrive late (15 or more minutes).  Arriving late affects you and other patients whose appointments are after yours.  Also, if you miss three or more appointments without notifying the office, you may be dismissed from the clinic at the provider's discretion.      For prescription refill requests, have your pharmacy contact our office and allow 72 hours for refills to be completed.    Today you received the following chemotherapy and/or immunotherapy agents: Retifanlimab [place]), Paclitaxel (Taxol) and Carboplatin.      To help prevent nausea and vomiting after your treatment, we encourage you to take your nausea medication as directed.  BELOW ARE SYMPTOMS THAT SHOULD BE REPORTED IMMEDIATELY: *FEVER GREATER THAN 100.4 F (38 C) OR HIGHER *CHILLS OR SWEATING *NAUSEA AND VOMITING THAT IS NOT CONTROLLED WITH YOUR NAUSEA MEDICATION *UNUSUAL SHORTNESS OF BREATH *UNUSUAL BRUISING OR BLEEDING *URINARY PROBLEMS (pain or burning when urinating, or frequent urination) *BOWEL PROBLEMS (unusual diarrhea, constipation, pain near the anus) TENDERNESS IN MOUTH AND THROAT WITH OR WITHOUT PRESENCE OF ULCERS (sore throat, sores in mouth, or a toothache) UNUSUAL RASH, SWELLING OR PAIN  UNUSUAL VAGINAL DISCHARGE OR ITCHING   Items with * indicate a potential emergency and should be followed up as soon as possible or go to the Emergency Department if any problems should occur.  Please  show the CHEMOTHERAPY ALERT CARD or IMMUNOTHERAPY ALERT CARD at check-in to the Emergency Department and triage nurse.  Should you have questions after your visit or need to cancel or reschedule your appointment, please contact [facility]  Dept: [phone]  and follow the prompts.  Office hours are 8:00 a.m. to 4:30 p.m. Monday - Friday. Please note that voicemails left after 4:00 p.m. may not be returned until the following business day.  We are closed weekends and major holidays. You have access to a nurse at all times for urgent questions. Please call the main number to the clinic Dept: [phone] and follow the prompts.   For any non-urgent questions, you may also contact your provider using MyChart. We now offer e-Visits for anyone 19 and older to request care online for non-urgent symptoms. For details visit [email or web address].   Also download the MyChart app! Go to the app store, search "MyChart", open the app, select [place], and log in with your MyChart username and password.

## 2024-02-09 ENCOUNTER — Other Ambulatory Visit: Payer: Self-pay

## 2024-02-13 ENCOUNTER — Inpatient Hospital Stay: Payer: BC Managed Care – PPO

## 2024-02-13 ENCOUNTER — Inpatient Hospital Stay: Payer: BC Managed Care – PPO | Admitting: Oncology

## 2024-02-13 VITALS — BP 132/89 | HR 100 | Temp 98.1°F | Resp 18 | Ht 67.0 in | Wt 174.2 lb

## 2024-02-13 VITALS — BP 121/87 | HR 98 | Resp 18

## 2024-02-13 DIAGNOSIS — C210 Malignant neoplasm of anus, unspecified: Secondary | ICD-10-CM

## 2024-02-13 LAB — CBC WITH DIFFERENTIAL (CANCER CENTER ONLY)
Abs Immature Granulocytes: 0.01 10*3/uL (ref 0.00–0.07)
Basophils Absolute: 0 10*3/uL (ref 0.0–0.1)
Basophils Relative: 1 %
Eosinophils Absolute: 0 10*3/uL (ref 0.0–0.5)
Eosinophils Relative: 1 %
HCT: 36.2 % (ref 36.0–46.0)
Hemoglobin: 12.4 g/dL (ref 12.0–15.0)
Immature Granulocytes: 0 %
Lymphocytes Relative: 11 %
Lymphs Abs: 0.4 10*3/uL — ABNORMAL LOW (ref 0.7–4.0)
MCH: 34.8 pg — ABNORMAL HIGH (ref 26.0–34.0)
MCHC: 34.3 g/dL (ref 30.0–36.0)
MCV: 101.7 fL — ABNORMAL HIGH (ref 80.0–100.0)
Monocytes Absolute: 0.2 10*3/uL (ref 0.1–1.0)
Monocytes Relative: 5 %
Neutro Abs: 3.1 10*3/uL (ref 1.7–7.7)
Neutrophils Relative %: 82 %
Platelet Count: 140 10*3/uL — ABNORMAL LOW (ref 150–400)
RBC: 3.56 MIL/uL — ABNORMAL LOW (ref 3.87–5.11)
RDW: 14 % (ref 11.5–15.5)
WBC Count: 3.8 10*3/uL — ABNORMAL LOW (ref 4.0–10.5)
nRBC: 0 % (ref 0.0–0.2)

## 2024-02-13 LAB — CMP (CANCER CENTER ONLY)
ALT: 143 U/L — ABNORMAL HIGH (ref 0–44)
AST: 71 U/L — ABNORMAL HIGH (ref 15–41)
Albumin: 4.5 g/dL (ref 3.5–5.0)
Alkaline Phosphatase: 79 U/L (ref 38–126)
Anion gap: 8 (ref 5–15)
BUN: 10 mg/dL (ref 6–20)
CO2: 26 mmol/L (ref 22–32)
Calcium: 9.4 mg/dL (ref 8.9–10.3)
Chloride: 101 mmol/L (ref 98–111)
Creatinine: 0.61 mg/dL (ref 0.44–1.00)
GFR, Estimated: >60 mL/min (ref >60)
Glucose, Bld: 243 mg/dL — ABNORMAL HIGH (ref 70–99)
Potassium: 4.1 mmol/L (ref 3.5–5.1)
Sodium: 135 mmol/L (ref 135–145)
Total Bilirubin: 0.6 mg/dL (ref 0.0–1.2)
Total Protein: 6.9 g/dL (ref 6.5–8.1)

## 2024-02-13 MED ORDER — DIPHENHYDRAMINE HCL 50 MG/ML IJ SOLN
25.0000 mg | Freq: Once | INTRAMUSCULAR | Status: AC
  Administered 2024-02-13: 25 mg via INTRAVENOUS
  Filled 2024-02-13: qty 1

## 2024-02-13 MED ORDER — HEPARIN SOD (PORK) LOCK FLUSH 100 UNIT/ML IV SOLN
500.0000 [IU] | Freq: Once | INTRAVENOUS | Status: AC | PRN
  Administered 2024-02-13: 500 [IU]

## 2024-02-13 MED ORDER — FAMOTIDINE IN NACL 20-0.9 MG/50ML-% IV SOLN
20.0000 mg | Freq: Once | INTRAVENOUS | Status: AC
  Administered 2024-02-13: 20 mg via INTRAVENOUS
  Filled 2024-02-13: qty 50

## 2024-02-13 MED ORDER — SODIUM CHLORIDE 0.9 % IV SOLN: INTRAVENOUS | Status: DC

## 2024-02-13 MED ORDER — SODIUM CHLORIDE 0.9 % IV SOLN
60.0000 mg/m2 | Freq: Once | INTRAVENOUS | Status: AC
  Administered 2024-02-13: 120 mg via INTRAVENOUS
  Filled 2024-02-13: qty 20

## 2024-02-13 MED ORDER — DEXAMETHASONE SODIUM PHOSPHATE 10 MG/ML IJ SOLN
5.0000 mg | Freq: Once | INTRAMUSCULAR | Status: AC
  Administered 2024-02-13: 5 mg via INTRAVENOUS
  Filled 2024-02-13: qty 1

## 2024-02-13 MED ORDER — SODIUM CHLORIDE 0.9% FLUSH
10.0000 mL | INTRAVENOUS | Status: DC | PRN
  Administered 2024-02-13: 10 mL

## 2024-02-13 NOTE — Progress Notes (Signed)
  [facility]cer Center OFFICE PROGRESS NOTE   Diagnosis: Anal cancer  INTERVAL HISTORY:   Beth [name] completed another treatment with paclitaxel, carboplatin, and retifanlimab on 02/08/2024.  No rash or diarrhea.  She has soreness of the nails.  No peripheral neuropathy.  She has intermittent "cramping "discomfort in the right abdomen.  She takes oxycodone occasionally.  She has intermittent discomfort at the Port-A-Cath site.  She is using ice packs.  Objective:  Vital signs in last 24 hours:  Blood pressure 132/89, pulse 100, temperature 98.1 F (36.7 C), temperature source Temporal, resp. rate 18, height 5\' 7"  (1.702 m), weight 174 lb 3.2 oz (79 kg), last menstrual period 03/13/2012, SpO2 100%.    HEENT: No thrush or ulcers Resp: Lungs clear bilaterally Cardio: Regular rate and rhythm GI: No hepatosplenomegaly, no mass, left lower quadrant colostomy, mild tenderness in the right mid abdomen Vascular: No leg edema  Skin: Discoloration of the fingernails  Portacath/PICC-without erythema  Lab Results:  Lab Results  Component Value Date   WBC 3.8 (L) 02/13/2024   HGB 12.4 02/13/2024   HCT 36.2 02/13/2024   MCV 101.7 (H) 02/13/2024   PLT 140 (L) 02/13/2024   NEUTROABS 3.1 02/13/2024    CMP  Lab Results  Component Value Date   NA 135 02/13/2024   K 4.1 02/13/2024   CL 101 02/13/2024   CO2 26 02/13/2024   GLUCOSE 243 (H) 02/13/2024   BUN 10 02/13/2024   CREATININE 0.61 02/13/2024   CALCIUM 9.4 02/13/2024   PROT 6.9 02/13/2024   ALBUMIN 4.5 02/13/2024   AST 71 (H) 02/13/2024   ALT 143 (H) 02/13/2024   ALKPHOS 79 02/13/2024   BILITOT 0.6 02/13/2024   GFRNONAA >60 02/13/2024   GFRAA 106 11/29/2020    Lab Results  Component Value Date   CEA 1.39 10/10/2023    Lab Results  Component Value Date   INR 1.1 09/07/2023   LABPROT 14.1 09/07/2023    Imaging:  No results found.  Medications: I have reviewed the patient's current  medications.   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  Small adjacent lower perirectal lymph nodes in addition to a 0.5 cm sacral node.  Focal enhancing lesion in the right hepatic lobe 1.2 x 0.8 x 0.9 cm.  2.1 x 1.5 cm cystic lesion right ovary with thin enhancing rim. PET scan 03/16/2021-hypermetabolic mass within the anal canal.  No definite evidence of local or distant metastatic disease.  7 mm left submandibular lymph node with mild FDG uptake, likely reactive.  Focal site of FDG uptake within the inner quadrant of the right breast. Radiation 03/21/2021-04/27/2021 Cycle one 5-FU/mitomycin-C 03/21/2021 Cycle two 5-FU/Mitomycin-C 04/18/2021, 5-FU dose reduced secondary to mucositis Anoscopy by Dr. Michaell Cowing 07/20/2021-divet in the right posterior lateral aspect with some right anterior and right lateral folds consistent with radiation treatment.  No ulceration.  Not fully  resolved but markedly shrunk down. Anorectal mass 09/30/2021 examination under anesthesia, biopsy-poorly differentiated carcinoma-basaloid squamous carcinoma and high-grade neuroendocrine carcinoma, margins positive APR/and vertical rectus abdominis flap 11/03/2021,  invasive poorly differentiated carcinoma, negative resection margins, 0/10 nodes, treatment response score 2,ypT1,ypN0 CT abdomen/pelvis 12/03/2021-high-grade small bowel obstruction transitioning in the left lower abdomen.  Presacral rim-enhancing 4.4 x 2.8 x 3.8 cm fluid collection.  No air in the collection.  Prior study described a potential abnormality in the right lobe of the liver, not seen on current study.  Cystic lesion of the right ovary resolved. CT chest 12/07/2021-interval development of bilateral trace pleural effusions. CTs 12/04/2022-new 2.6 x 2.5 cm right lower lobe nodule PET 12/21/2022-FDG avid right lower lobe nodule, no other evidence of metastatic disease 12/29/2022 right lower lobectomy, lymph node dissection-poorly differentiated carcinoma consistent with metastasis, 4.8 x 2.9 x 2.6 cm, margins free, 7 lymph nodes negative for carcinoma; cytohistomorphology correlation supports the current tumor being a metastasis from the previously diagnosed anal carcinoma CT chest 04/17/2023-interval right lower lobectomy with resection of right lung mass, pleural thickening and small right pleural effusion, no new mass or lymph node enlargement CTs 07/20/2023-diminished postoperative right pleural effusion.  Unchanged postoperative findings status post abdominoperineal resection with left lower quadrant and colostomy.  No evidence of metastatic disease. CT abdomen/pelvis 08/31/2023-heterogenous soft tissue nodule in the right upper quadrant anterior abdominal wall, no evidence of metastatic disease CT/ultrasound guided biopsy of right abdominal wall mass 09/07/2023-metastatic neoplasm consistent with a metastasis from anal cancer, Foundation 1-HRD signature negative, MSS, tumor mutation burden 4, BRCA 1 alteration, PD-L1 TPS 10% Cycle 1 day 1 Taxol//carboplatin 10/19/2023, day 8 Taxol 10/26/2023, day 15 Taxol held 11/02/2023 due to neutropenia Cycle 2-day 1 Taxol/carboplatin plus Retifanlimab  11/14/2023, Taxol and carboplatin dose reduced due to previous neutropenia, mild thrombocytopenia; day 8 Taxol 11/22/2023, day 15 Taxol 11/29/2023 Cycle 3-day 1 Taxol/carboplatin plus retifanlimab 12/14/2023, day 8 Taxol 12/21/2023, day 15 Taxol 12/28/2023 CT abdomen/pelvis 01/09/2024: Decreased right anterior abdominal wall nodule, no evidence of disease progression Cycle 4-day 1 Taxol/carboplatin/retifanlimab 01/11/2024; day 8 Taxol 01/18/2024 (given peripherally), day 15 Taxol 01/25/2024 (given peripherally) Cycle 5-day 1 Taxol/carboplatin/retifanlimab 02/08/2024, day 8 Taxol 02/13/2024   Remote history of an anal fissure repair Tobacco use Moderate alcohol use Eczema Right breast mass- PET scan 03/16/2021 with focal FDG activity in the inner right breast Mammogram and right breast ultrasound 03/28/2021- negative Bilateral breast MRI 03/31/2021- 7 mm mass in the upper inner right breast correlating with the PET findings, no other abnormality, no adenopathy MRI guided biopsy of right breast mass 04/11/2021- fibrocystic change with sclerosing adenosis, small focus of inflammation/fibrosis suggestive of resolving fat necrosis, no evidence of malignancy--repeat bilateral breast MRI at a 85-month interval; bilateral breast MRI on 10/13/2021-no evidence of breast malignancy.  Annual screening mammography recommended.   7.  Admission with small bowel obstruction 12/03/2021 status post exploratory laparotomy and lysis of adhesions 8.  Right abdominal pain/tenderness-potentially related to the right abdominal wall mass noted on CT 08/31/2023 9.  Elevated liver enzymes-chronic, etiology unclear 10.  Hematuria-evaluated by urology 09/11/2023, cystoscopy with radiation and infection changes, no tumor seen 11.  Right neck/chest tenderness 12/21/2023-negative Doppler study.  Recurrent symptoms 01/15/2024.  Started on a course of Augmentin.  Referred for Port-A-Cath study 01/18/2024-study completed 02/01/2024, intact tunneled right  internal jugular vein Port-A-Cath with catheter tip located just below the SVC/RA junction.  No evidence of catheter rupture, contrast extravasation or fibrin sheath. Port-A-Cath dye study 02/01/2024: Normal  Disposition: Beth [name] appears stable.  She is completing cycle 5 of systemic therapy for treatment of metastatic anal cancer.  She is tolerating the treatment well.  The plan is to complete 6 cycles of chemotherapy prior to a restaging evaluation.  We will refer her for restaging after cycle 5 if the abdominal pain progresses.  The nail changes are likely secondary to paclitaxel.  She does not have neuropathy symptoms.  The Port-A-Cath dye study was negative.  The Port-A-Cath will be used for chemotherapy today.  She will return for an office visit and day 15 Taxol in 1 week.  Thornton Papas, MD  02/13/2024  9:36 AM

## 2024-02-13 NOTE — Progress Notes (Signed)
 Patient seen by Dr. Thornton Papas today  Vitals are within treatment parameters:Yes   Labs are within treatment parameters: No (Please specify and give further instructions.)  OK to treat w/ALT 143  Treatment plan has been signed: Yes   Per physician team, Patient is ready for treatment and there are NO modifications to the treatment plan.

## 2024-02-13 NOTE — Patient Instructions (Signed)
 [facility]   Discharge Instructions: Thank you for choosing [facility] to provide your oncology and hematology care.   If you have a lab appointment with the Cancer Center, please go directly to the Cancer Center and check in at the registration area.   Wear comfortable clothing and clothing appropriate for easy access to any Portacath or PICC line.   We strive to give you quality time with your provider. You may need to reschedule your appointment if you arrive late (15 or more minutes).  Arriving late affects you and other patients whose appointments are after yours.  Also, if you miss three or more appointments without notifying the office, you may be dismissed from the clinic at the provider's discretion.      For prescription refill requests, have your pharmacy contact our office and allow 72 hours for refills to be completed.    Today you received the following chemotherapy and/or immunotherapy agents Paclitaxel (TAXOL).      To help prevent nausea and vomiting after your treatment, we encourage you to take your nausea medication as directed.  BELOW ARE SYMPTOMS THAT SHOULD BE REPORTED IMMEDIATELY: *FEVER GREATER THAN 100.4 F (38 C) OR HIGHER *CHILLS OR SWEATING *NAUSEA AND VOMITING THAT IS NOT CONTROLLED WITH YOUR NAUSEA MEDICATION *UNUSUAL SHORTNESS OF BREATH *UNUSUAL BRUISING OR BLEEDING *URINARY PROBLEMS (pain or burning when urinating, or frequent urination) *BOWEL PROBLEMS (unusual diarrhea, constipation, pain near the anus) TENDERNESS IN MOUTH AND THROAT WITH OR WITHOUT PRESENCE OF ULCERS (sore throat, sores in mouth, or a toothache) UNUSUAL RASH, SWELLING OR PAIN  UNUSUAL VAGINAL DISCHARGE OR ITCHING   Items with * indicate a potential emergency and should be followed up as soon as possible or go to the Emergency Department if any problems should occur.  Please show the CHEMOTHERAPY ALERT CARD or  IMMUNOTHERAPY ALERT CARD at check-in to the Emergency Department and triage nurse.  Should you have questions after your visit or need to cancel or reschedule your appointment, please contact [facility]  Dept: [phone]  and follow the prompts.  Office hours are 8:00 a.m. to 4:30 p.m. Monday - Friday. Please note that voicemails left after 4:00 p.m. may not be returned until the following business day.  We are closed weekends and major holidays. You have access to a nurse at all times for urgent questions. Please call the main number to the clinic Dept: [phone] and follow the prompts.   For any non-urgent questions, you may also contact your provider using MyChart. We now offer e-Visits for anyone 67 and older to request care online for non-urgent symptoms. For details visit [email or web address].   Also download the MyChart app! Go to the app store, search "MyChart", open the app, select [facility], and log in with your MyChart username and password.  Paclitaxel Injection What is this medication? PACLITAXEL (PAK li TAX el) treats some types of cancer. It works by slowing down the growth of cancer cells. This medicine may be used for other purposes; ask your health care provider or pharmacist if you have questions. COMMON BRAND NAME(S): Onxol, Taxol What should I tell my care team before I take this medication? They need to know if you have any of these conditions: Heart disease Liver disease Low white blood cell levels An unusual or allergic reaction to paclitaxel, other medications, foods, dyes, or  preservatives If you or your partner are pregnant or trying to get pregnant Breast-feeding How should I use this medication? This medication is injected into a vein. It is given by your care team in a hospital or clinic setting. Talk to your care team about the use of this medication in children. While it may be given to children  for selected conditions, precautions do apply. Overdosage: If you think you have taken too much of this medicine contact a poison control center or emergency room at once. NOTE: This medicine is only for you. Do not share this medicine with others. What if I miss a dose? Keep appointments for follow-up doses. It is important not to miss your dose. Call your care team if you are unable to keep an appointment. What may interact with this medication? Do not take this medication with any of the following: Live virus vaccines Other medications may affect the way this medication works. Talk with your care team about all of the medications you take. They may suggest changes to your treatment plan to lower the risk of side effects and to make sure your medications work as intended. This list may not describe all possible interactions. Give your health care provider a list of all the medicines, herbs, non-prescription drugs, or dietary supplements you use. Also tell them if you smoke, drink alcohol, or use illegal drugs. Some items may interact with your medicine. What should I watch for while using this medication? Your condition will be monitored carefully while you are receiving this medication. You may need blood work while taking this medication. This medication may make you feel generally unwell. This is not uncommon as chemotherapy can affect healthy cells as well as cancer cells. Report any side effects. Continue your course of treatment even though you feel ill unless your care team tells you to stop. This medication can cause serious allergic reactions. To reduce the risk, your care team may give you other medications to take before receiving this one. Be sure to follow the directions from your care team. This medication may increase your risk of getting an infection. Call your care team for advice if you get a fever, chills, sore throat, or other symptoms of a cold or flu. Do not treat yourself. Try  to avoid being around people who are sick. This medication may increase your risk to bruise or bleed. Call your care team if you notice any unusual bleeding. Be careful brushing or flossing your teeth or using a toothpick because you may get an infection or bleed more easily. If you have any dental work done, tell your dentist you are receiving this medication. Talk to your care team if you may be pregnant. Serious birth defects can occur if you take this medication during pregnancy. Talk to your care team before breastfeeding. Changes to your treatment plan may be needed. What side effects may I notice from receiving this medication? Side effects that you should report to your care team as soon as possible: Allergic reactions--skin rash, itching, hives, swelling of the face, lips, tongue, or throat Heart rhythm changes--fast or irregular heartbeat, dizziness, feeling faint or lightheaded, chest pain, trouble breathing Increase in blood pressure Infection--fever, chills, cough, sore throat, wounds that don't heal, pain or trouble when passing urine, general feeling of discomfort or being unwell Low blood pressure--dizziness, feeling faint or lightheaded, blurry vision Low red blood cell level--unusual weakness or fatigue, dizziness, headache, trouble breathing Painful swelling, warmth, or redness of the skin,  blisters or sores at the infusion site Pain, tingling, or numbness in the hands or feet Slow heartbeat--dizziness, feeling faint or lightheaded, confusion, trouble breathing, unusual weakness or fatigue Unusual bruising or bleeding Side effects that usually do not require medical attention (report to your care team if they continue or are bothersome): Diarrhea Hair loss Joint pain Loss of appetite Muscle pain Nausea Vomiting This list may not describe all possible side effects. Call your doctor for medical advice about side effects. You may report side effects to FDA at  1-800-FDA-1088. Where should I keep my medication? This medication is given in a hospital or clinic. It will not be stored at home. NOTE: This sheet is a summary. It may not cover all possible information. If you have questions about this medicine, talk to your doctor, pharmacist, or health care provider.  2024 [employer]vier/Gold Standard (2022-04-25 00:00:00)

## 2024-02-20 ENCOUNTER — Other Ambulatory Visit: Payer: Self-pay

## 2024-02-22 ENCOUNTER — Inpatient Hospital Stay: Payer: BC Managed Care – PPO

## 2024-02-22 ENCOUNTER — Inpatient Hospital Stay: Payer: BC Managed Care – PPO | Admitting: Nurse Practitioner

## 2024-02-22 ENCOUNTER — Encounter: Payer: Self-pay | Admitting: Nurse Practitioner

## 2024-02-22 ENCOUNTER — Inpatient Hospital Stay: Payer: BC Managed Care – PPO | Attending: Oncology

## 2024-02-22 VITALS — BP 129/92 | HR 89 | Temp 98.1°F | Resp 16

## 2024-02-22 VITALS — BP 127/86 | HR 100 | Temp 98.2°F | Resp 18 | Ht 67.0 in | Wt 177.7 lb

## 2024-02-22 DIAGNOSIS — Z5111 Encounter for antineoplastic chemotherapy: Secondary | ICD-10-CM | POA: Insufficient documentation

## 2024-02-22 DIAGNOSIS — Z79620 Long term (current) use of immunosuppressive biologic: Secondary | ICD-10-CM | POA: Diagnosis not present

## 2024-02-22 DIAGNOSIS — C210 Malignant neoplasm of anus, unspecified: Secondary | ICD-10-CM

## 2024-02-22 DIAGNOSIS — Z79633 Long term (current) use of mitotic inhibitor: Secondary | ICD-10-CM | POA: Insufficient documentation

## 2024-02-22 DIAGNOSIS — Z79630 Long term (current) use of alkylating agent: Secondary | ICD-10-CM | POA: Insufficient documentation

## 2024-02-22 LAB — CBC WITH DIFFERENTIAL (CANCER CENTER ONLY)
Abs Immature Granulocytes: 0.03 10*3/uL (ref 0.00–0.07)
Basophils Absolute: 0 10*3/uL (ref 0.0–0.1)
Basophils Relative: 1 %
Eosinophils Absolute: 0.1 10*3/uL (ref 0.0–0.5)
Eosinophils Relative: 2 %
HCT: 33.4 % — ABNORMAL LOW (ref 36.0–46.0)
Hemoglobin: 11.5 g/dL — ABNORMAL LOW (ref 12.0–15.0)
Immature Granulocytes: 1 %
Lymphocytes Relative: 15 %
Lymphs Abs: 0.4 10*3/uL — ABNORMAL LOW (ref 0.7–4.0)
MCH: 35.5 pg — ABNORMAL HIGH (ref 26.0–34.0)
MCHC: 34.4 g/dL (ref 30.0–36.0)
MCV: 103.1 fL — ABNORMAL HIGH (ref 80.0–100.0)
Monocytes Absolute: 0.5 10*3/uL (ref 0.1–1.0)
Monocytes Relative: 16 %
Neutro Abs: 1.9 10*3/uL (ref 1.7–7.7)
Neutrophils Relative %: 65 %
Platelet Count: 160 10*3/uL (ref 150–400)
RBC: 3.24 MIL/uL — ABNORMAL LOW (ref 3.87–5.11)
RDW: 14.6 % (ref 11.5–15.5)
WBC Count: 2.9 10*3/uL — ABNORMAL LOW (ref 4.0–10.5)
nRBC: 0.7 % — ABNORMAL HIGH (ref 0.0–0.2)

## 2024-02-22 LAB — CMP (CANCER CENTER ONLY)
ALT: 111 U/L — ABNORMAL HIGH (ref 0–44)
AST: 50 U/L — ABNORMAL HIGH (ref 15–41)
Albumin: 4.4 g/dL (ref 3.5–5.0)
Alkaline Phosphatase: 74 U/L (ref 38–126)
Anion gap: 9 (ref 5–15)
BUN: 10 mg/dL (ref 6–20)
CO2: 25 mmol/L (ref 22–32)
Calcium: 9 mg/dL (ref 8.9–10.3)
Chloride: 101 mmol/L (ref 98–111)
Creatinine: 0.57 mg/dL (ref 0.44–1.00)
GFR, Estimated: >60 mL/min (ref >60)
Glucose, Bld: 229 mg/dL — ABNORMAL HIGH (ref 70–99)
Potassium: 3.9 mmol/L (ref 3.5–5.1)
Sodium: 135 mmol/L (ref 135–145)
Total Bilirubin: 0.5 mg/dL (ref 0.0–1.2)
Total Protein: 6.6 g/dL (ref 6.5–8.1)

## 2024-02-22 MED ORDER — DIPHENHYDRAMINE HCL 50 MG/ML IJ SOLN
25.0000 mg | Freq: Once | INTRAMUSCULAR | Status: AC
  Administered 2024-02-22: 25 mg via INTRAVENOUS
  Filled 2024-02-22: qty 1

## 2024-02-22 MED ORDER — SODIUM CHLORIDE 0.9 % IV SOLN: INTRAVENOUS | Status: DC

## 2024-02-22 MED ORDER — SODIUM CHLORIDE 0.9% FLUSH
10.0000 mL | INTRAVENOUS | Status: DC | PRN
  Administered 2024-02-22: 10 mL

## 2024-02-22 MED ORDER — HEPARIN SOD (PORK) LOCK FLUSH 100 UNIT/ML IV SOLN
500.0000 [IU] | Freq: Once | INTRAVENOUS | Status: AC | PRN
  Administered 2024-02-22: 500 [IU]

## 2024-02-22 MED ORDER — DEXAMETHASONE SODIUM PHOSPHATE 10 MG/ML IJ SOLN
5.0000 mg | Freq: Once | INTRAMUSCULAR | Status: AC
  Administered 2024-02-22: 5 mg via INTRAVENOUS
  Filled 2024-02-22: qty 1

## 2024-02-22 MED ORDER — FAMOTIDINE IN NACL 20-0.9 MG/50ML-% IV SOLN
20.0000 mg | Freq: Once | INTRAVENOUS | Status: AC
  Administered 2024-02-22: 20 mg via INTRAVENOUS
  Filled 2024-02-22: qty 50

## 2024-02-22 MED ORDER — SODIUM CHLORIDE 0.9 % IV SOLN
60.0000 mg/m2 | Freq: Once | INTRAVENOUS | Status: AC
  Administered 2024-02-22: 120 mg via INTRAVENOUS
  Filled 2024-02-22: qty 20

## 2024-02-22 NOTE — Patient Instructions (Signed)

## 2024-02-22 NOTE — Progress Notes (Signed)
 Patient seen by [name] NP today  Vitals are within treatment parameters:Yes   Labs are within treatment parameters: No (Please specify and give further instructions.)  OK to proceed w/ALT 111 (lower)  Treatment plan has been signed: Yes   Per physician team, Patient is ready for treatment and there are NO modifications to the treatment plan.

## 2024-02-22 NOTE — Progress Notes (Signed)
 [facility] OFFICE PROGRESS NOTE   Diagnosis: Anal cancer  INTERVAL HISTORY:   Beth [name] returns as scheduled.  She completed cycle 5-day 8 Taxol 02/13/2024.  She denies nausea/vomiting.  No mouth sores.  No diarrhea or constipation.  No numbness or tingling in the hands or feet.  Fingernails continue to be "sore".  She continues to have periodic "spasms" at the right abdomen.  She takes oxycodone if needed.  No fever, cough, shortness of breath.  No issues with the Port-A-Cath.  No rash.  She had a headache earlier this week.  She has a history of migraines.  She took Tylenol and the headache resolved.    Objective:  Vital signs in last 24 hours:  Blood pressure 127/86, pulse 100, temperature 98.2 F (36.8 C), temperature source Temporal, resp. rate 18, height 5\' 7"  (1.702 m), weight 177 lb 11.2 oz (80.6 kg), last menstrual period 03/13/2012, SpO2 100%.    HEENT: No thrush or ulcers. Lymphatics: No palpable cervical, supraclavicular or right axillary lymph nodes.  Question tiny very medial lymph node high left axilla. Resp: Lungs clear bilaterally. Cardio: Regular rate and rhythm. GI: Abdomen soft.  Mild tenderness at the right abdomen.  No hepatomegaly.  No mass.  Left lower quadrant colostomy. Vascular: No leg edema. Skin: No rash. Port-A-Cath without erythema.  Lab Results:  Lab Results  Component Value Date   WBC 3.8 (L) 02/13/2024   HGB 12.4 02/13/2024   HCT 36.2 02/13/2024   MCV 101.7 (H) 02/13/2024   PLT 140 (L) 02/13/2024   NEUTROABS 3.1 02/13/2024    Imaging:  No results found.  Medications: I have reviewed the patient's current medications.  Assessment/Plan: Squamous cell carcinoma of the anal canal Colonoscopy 01/28/2021-rectal mass extending to the "outside", palpable on exam-biopsy revealed high-grade squamous intraepithelial lesion (AIN 3/carcinoma in situ) Exam under anesthesia with transanal excision of an anal canal mass  02/25/2021-frozen section consistent with squamous cell carcinoma, 4 x 4 centimeter sessile anal canal mass extending to the anal verge, right lateral and posterior, 30% circumferential, fixed to the anal sphincter complex; anal rectal mass with mixed high-grade neuroendocrine squamous cell carcinoma; posterior anal tag with squamous cell carcinoma; left labial lesion-condyloma acuminata CTs 03/07/2021-ill-definition of tissue planes along the anus with abnormal soft tissue prominence posteriorly along the anal canal suspicious for mass.  Small adjacent lower perirectal lymph nodes in addition to a 0.5 cm sacral node.  Focal enhancing lesion in the right hepatic lobe 1.2 x 0.8 x 0.9 cm.  2.1 x 1.5 cm cystic lesion right ovary with thin enhancing rim. PET scan 03/16/2021-hypermetabolic mass within the anal canal.  No definite evidence of local or distant metastatic disease.  7 mm left submandibular lymph node with mild FDG uptake, likely reactive.  Focal site of FDG uptake within the inner quadrant of the right breast. Radiation 03/21/2021-04/27/2021 Cycle one 5-FU/mitomycin-C 03/21/2021 Cycle two 5-FU/Mitomycin-C 04/18/2021, 5-FU dose reduced secondary to mucositis Anoscopy by Dr. Michaell Cowing 07/20/2021-divet in the right posterior lateral aspect with some right anterior and right lateral folds consistent with radiation treatment.  No ulceration.  Not fully resolved but markedly shrunk down. Anorectal mass 09/30/2021 examination under anesthesia, biopsy-poorly differentiated carcinoma-basaloid squamous carcinoma and high-grade neuroendocrine carcinoma, margins positive APR/and vertical rectus abdominis flap 11/03/2021, invasive poorly differentiated carcinoma, negative resection margins, 0/10 nodes, treatment response score 2,ypT1,ypN0 CT abdomen/pelvis 12/03/2021-high-grade small bowel obstruction transitioning in the left lower abdomen.  Presacral rim-enhancing 4.4 x 2.8 x 3.8 cm fluid collection.  No air in the  collection.  Prior study described a potential abnormality in the right lobe of the liver, not seen on current study.  Cystic lesion of the right ovary resolved. CT chest 12/07/2021-interval development of bilateral trace pleural effusions. CTs 12/04/2022-new 2.6 x 2.5 cm right lower lobe nodule PET 12/21/2022-FDG avid right lower lobe nodule, no other evidence of metastatic disease 12/29/2022 right lower lobectomy, lymph node dissection-poorly differentiated carcinoma consistent with metastasis, 4.8 x 2.9 x 2.6 cm, margins free, 7 lymph nodes negative for carcinoma; cytohistomorphology correlation supports the current tumor being a metastasis from the previously diagnosed anal carcinoma CT chest 04/17/2023-interval right lower lobectomy with resection of right lung mass, pleural thickening and small right pleural effusion, no new mass or lymph node enlargement CTs 07/20/2023-diminished postoperative right pleural effusion.  Unchanged postoperative findings status post abdominoperineal resection with left lower quadrant and colostomy.  No evidence of metastatic disease. CT abdomen/pelvis 08/31/2023-heterogenous soft tissue nodule in the right upper quadrant anterior abdominal wall, no evidence of metastatic disease CT/ultrasound guided biopsy of right abdominal wall mass 09/07/2023-metastatic neoplasm consistent with a metastasis from anal cancer, Foundation 1-HRD signature negative, MSS, tumor mutation burden 4, BRCA 1 alteration, PD-L1 TPS 10% Cycle 1 day 1 Taxol//carboplatin 10/19/2023, day 8 Taxol 10/26/2023, day 15 Taxol held 11/02/2023 due to neutropenia Cycle 2-day 1 Taxol/carboplatin plus Retifanlimab 11/14/2023, Taxol and carboplatin dose reduced due to previous neutropenia, mild thrombocytopenia; day 8 Taxol 11/22/2023, day 15 Taxol 11/29/2023 Cycle 3-day 1 Taxol/carboplatin plus retifanlimab 12/14/2023, day 8 Taxol 12/21/2023, day 15 Taxol 12/28/2023 CT abdomen/pelvis 01/09/2024: Decreased right anterior  abdominal wall nodule, no evidence of disease progression Cycle 4-day 1 Taxol/carboplatin/retifanlimab 01/11/2024; day 8 Taxol 01/18/2024 (given peripherally), day 15 Taxol 01/25/2024 (given peripherally) Cycle 5-day 1 Taxol/carboplatin/retifanlimab 02/08/2024, day 8 Taxol 02/13/2024, day 15 Taxol 02/22/2024   Remote history of an anal fissure repair Tobacco use Moderate alcohol use Eczema Right breast mass- PET scan 03/16/2021 with focal FDG activity in the inner right breast Mammogram and right breast ultrasound 03/28/2021- negative Bilateral breast MRI 03/31/2021- 7 mm mass in the upper inner right breast correlating with the PET findings, no other abnormality, no adenopathy MRI guided biopsy of right breast mass 04/11/2021- fibrocystic change with sclerosing adenosis, small focus of inflammation/fibrosis suggestive of resolving fat necrosis, no evidence of malignancy--repeat bilateral breast MRI at a 66-month interval; bilateral breast MRI on 10/13/2021-no evidence of breast malignancy.  Annual screening mammography recommended.   7.  Admission with small bowel obstruction 12/03/2021 status post exploratory laparotomy and lysis of adhesions 8.  Right abdominal pain/tenderness-potentially related to the right abdominal wall mass noted on CT 08/31/2023 9.  Elevated liver enzymes-chronic, etiology unclear 10.  Hematuria-evaluated by urology 09/11/2023, cystoscopy with radiation and infection changes, no tumor seen 11.  Right neck/chest tenderness 12/21/2023-negative Doppler study.  Recurrent symptoms 01/15/2024.  Started on a course of Augmentin.  Referred for Port-A-Cath study 01/18/2024-study completed 02/01/2024, intact tunneled right internal jugular vein Port-A-Cath with catheter tip located just below the SVC/RA junction.  No evidence of catheter rupture, contrast extravasation or fibrin sheath. Port-A-Cath dye study 02/01/2024: Normal    Disposition: Beth [name] appears stable.  She will complete cycle 5  Taxol/carboplatin/retifanlimab today.  Overall she is tolerating treatment well.  There is no clinical evidence of disease progression.  Plan to proceed with cycle 5-day 15 Taxol today as scheduled.  We reviewed the overall plan for restaging CTs after she has completed 6 cycles.  CBC and chemistry panel  reviewed.  Labs adequate to proceed as above.  Stable elevation in AST/ALT.  She will return for follow-up and cycle 6 Taxol/carboplatin/retifanlimab in 2 weeks.  We are available to see her sooner if needed.  [name] ANP/GNP-BC   02/22/2024  9:05 AM

## 2024-02-22 NOTE — Patient Instructions (Signed)
 [facility] - A DEPT OF [facility]  Discharge Instructions: Thank you for choosing [facility] to provide your oncology and hematology care.   If you have a lab appointment with the Cancer Center, please go directly to the Cancer Center and check in at the registration area.   Wear comfortable clothing and clothing appropriate for easy access to any Portacath or PICC line.   We strive to give you quality time with your provider. You may need to reschedule your appointment if you arrive late (15 or more minutes).  Arriving late affects you and other patients whose appointments are after yours.  Also, if you miss three or more appointments without notifying the office, you may be dismissed from the clinic at the provider's discretion.      For prescription refill requests, have your pharmacy contact our office and allow 72 hours for refills to be completed.    Today you received the following chemotherapy and/or immunotherapy agents Taxol.  Paclitaxel Injection What is this medication? PACLITAXEL (PAK li TAX el) treats some types of cancer. It works by slowing down the growth of cancer cells. This medicine may be used for other purposes; ask your health care provider or pharmacist if you have questions. COMMON BRAND NAME(S): Onxol, Taxol What should I tell my care team before I take this medication? They need to know if you have any of these conditions: Heart disease Liver disease Low white blood cell levels An unusual or allergic reaction to paclitaxel, other medications, foods, dyes, or preservatives If you or your partner are pregnant or trying to get pregnant Breast-feeding How should I use this medication? This medication is injected into a vein. It is given by your care team in a hospital or clinic setting. Talk to your care team about the use of this medication in children. While it may be given to children for selected conditions,  precautions do apply. Overdosage: If you think you have taken too much of this medicine contact a poison control center or emergency room at once. NOTE: This medicine is only for you. Do not share this medicine with others. What if I miss a dose? Keep appointments for follow-up doses. It is important not to miss your dose. Call your care team if you are unable to keep an appointment. What may interact with this medication? Do not take this medication with any of the following: Live virus vaccines Other medications may affect the way this medication works. Talk with your care team about all of the medications you take. They may suggest changes to your treatment plan to lower the risk of side effects and to make sure your medications work as intended. This list may not describe all possible interactions. Give your health care provider a list of all the medicines, herbs, non-prescription drugs, or dietary supplements you use. Also tell them if you smoke, drink alcohol, or use illegal drugs. Some items may interact with your medicine. What should I watch for while using this medication? Your condition will be monitored carefully while you are receiving this medication. You may need blood work while taking this medication. This medication may make you feel generally unwell. This is not uncommon as chemotherapy can affect healthy cells as well as cancer cells. Report any side effects. Continue your course of treatment even though you feel ill unless your care team tells you to stop. This medication can cause serious allergic reactions. To reduce the risk,  your care team may give you other medications to take before receiving this one. Be sure to follow the directions from your care team. This medication may increase your risk of getting an infection. Call your care team for advice if you get a fever, chills, sore throat, or other symptoms of a cold or flu. Do not treat yourself. Try to avoid being around  people who are sick. This medication may increase your risk to bruise or bleed. Call your care team if you notice any unusual bleeding. Be careful brushing or flossing your teeth or using a toothpick because you may get an infection or bleed more easily. If you have any dental work done, tell your dentist you are receiving this medication. Talk to your care team if you may be pregnant. Serious birth defects can occur if you take this medication during pregnancy. Talk to your care team before breastfeeding. Changes to your treatment plan may be needed. What side effects may I notice from receiving this medication? Side effects that you should report to your care team as soon as possible: Allergic reactions--skin rash, itching, hives, swelling of the face, lips, tongue, or throat Heart rhythm changes--fast or irregular heartbeat, dizziness, feeling faint or lightheaded, chest pain, trouble breathing Increase in blood pressure Infection--fever, chills, cough, sore throat, wounds that don't heal, pain or trouble when passing urine, general feeling of discomfort or being unwell Low blood pressure--dizziness, feeling faint or lightheaded, blurry vision Low red blood cell level--unusual weakness or fatigue, dizziness, headache, trouble breathing Painful swelling, warmth, or redness of the skin, blisters or sores at the infusion site Pain, tingling, or numbness in the hands or feet Slow heartbeat--dizziness, feeling faint or lightheaded, confusion, trouble breathing, unusual weakness or fatigue Unusual bruising or bleeding Side effects that usually do not require medical attention (report to your care team if they continue or are bothersome): Diarrhea Hair loss Joint pain Loss of appetite Muscle pain Nausea Vomiting This list may not describe all possible side effects. Call your doctor for medical advice about side effects. You may report side effects to FDA at 1-800-FDA-1088. Where should I keep  my medication? This medication is given in a hospital or clinic. It will not be stored at home. NOTE: This sheet is a summary. It may not cover all possible information. If you have questions about this medicine, talk to your doctor, pharmacist, or health care provider.  2024 Elsevier/Gold Standard (2022-04-25 00:00:00)   To help prevent nausea and vomiting after your treatment, we encourage you to take your nausea medication as directed.  BELOW ARE SYMPTOMS THAT SHOULD BE REPORTED IMMEDIATELY: *FEVER GREATER THAN 100.4 F (38 C) OR HIGHER *CHILLS OR SWEATING *NAUSEA AND VOMITING THAT IS NOT CONTROLLED WITH YOUR NAUSEA MEDICATION *UNUSUAL SHORTNESS OF BREATH *UNUSUAL BRUISING OR BLEEDING *URINARY PROBLEMS (pain or burning when urinating, or frequent urination) *BOWEL PROBLEMS (unusual diarrhea, constipation, pain near the anus) TENDERNESS IN MOUTH AND THROAT WITH OR WITHOUT PRESENCE OF ULCERS (sore throat, sores in mouth, or a toothache) UNUSUAL RASH, SWELLING OR PAIN  UNUSUAL VAGINAL DISCHARGE OR ITCHING   Items with * indicate a potential emergency and should be followed up as soon as possible or go to the Emergency Department if any problems should occur.  Please show the CHEMOTHERAPY ALERT CARD or IMMUNOTHERAPY ALERT CARD at check-in to the Emergency Department and triage nurse.  Should you have questions after your visit or need to cancel or reschedule your appointment, please contact [facility] - A DEPT OF [facility]  Dept: [phone]  and follow the prompts.  Office hours are 8:00 a.m. to 4:30 p.m. Monday - Friday. Please note that voicemails left after 4:00 p.m. may not be returned until the following business day.  We are closed weekends and major holidays. You have access to a nurse at all times for urgent questions. Please call the main number to the clinic Dept: [phone] and follow the prompts.   For any non-urgent questions, you may  also contact your provider using MyChart. We now offer e-Visits for anyone 95 and older to request care online for non-urgent symptoms. For details visit [email or web address].   Also download the MyChart app! Go to the app store, search "MyChart", open the app, select [place], and log in with your MyChart username and password.

## 2024-02-22 NOTE — Progress Notes (Signed)
 Patient presents today for chemotherapy infusion of Taxol. Patient is in satisfactory condition with no new complaints voiced.  Vital signs are stable.  Labs reviewed by [name] NP during the office visit and all labs are within treatment parameters with exception of ALT 111. Per [name] NP patient okay to treat. We will proceed with treatment per MD orders.   Patient tolerated treatment well with no complaints voiced.  Patient left ambulatory in stable condition.  Vital signs stable at discharge.  Follow up as scheduled.

## 2024-02-23 ENCOUNTER — Other Ambulatory Visit: Payer: Self-pay

## 2024-02-26 ENCOUNTER — Other Ambulatory Visit: Payer: Self-pay

## 2024-03-02 ENCOUNTER — Other Ambulatory Visit: Payer: Self-pay | Admitting: Oncology

## 2024-03-07 ENCOUNTER — Encounter: Payer: Self-pay | Admitting: Oncology

## 2024-03-07 ENCOUNTER — Inpatient Hospital Stay: Payer: BC Managed Care – PPO

## 2024-03-07 ENCOUNTER — Inpatient Hospital Stay (HOSPITAL_BASED_OUTPATIENT_CLINIC_OR_DEPARTMENT_OTHER): Payer: BC Managed Care – PPO | Admitting: Oncology

## 2024-03-07 VITALS — BP 123/89 | HR 88 | Temp 97.7°F | Resp 18

## 2024-03-07 VITALS — BP 139/92 | HR 100 | Temp 98.2°F | Resp 18 | Ht 67.0 in | Wt 176.9 lb

## 2024-03-07 DIAGNOSIS — C210 Malignant neoplasm of anus, unspecified: Secondary | ICD-10-CM

## 2024-03-07 LAB — CMP (CANCER CENTER ONLY)
ALT: 82 U/L — ABNORMAL HIGH (ref 0–44)
AST: 46 U/L — ABNORMAL HIGH (ref 15–41)
Albumin: 4.2 g/dL (ref 3.5–5.0)
Alkaline Phosphatase: 78 U/L (ref 38–126)
Anion gap: 9 (ref 5–15)
BUN: 10 mg/dL (ref 6–20)
CO2: 25 mmol/L (ref 22–32)
Calcium: 9.2 mg/dL (ref 8.9–10.3)
Chloride: 105 mmol/L (ref 98–111)
Creatinine: 0.62 mg/dL (ref 0.44–1.00)
GFR, Estimated: >60 mL/min (ref >60)
Glucose, Bld: 194 mg/dL — ABNORMAL HIGH (ref 70–99)
Potassium: 4 mmol/L (ref 3.5–5.1)
Sodium: 139 mmol/L (ref 135–145)
Total Bilirubin: 0.7 mg/dL (ref 0.0–1.2)
Total Protein: 6.8 g/dL (ref 6.5–8.1)

## 2024-03-07 LAB — CBC WITH DIFFERENTIAL (CANCER CENTER ONLY)
Abs Immature Granulocytes: 0.03 10*3/uL (ref 0.00–0.07)
Basophils Absolute: 0 10*3/uL (ref 0.0–0.1)
Basophils Relative: 1 %
Eosinophils Absolute: 0.1 10*3/uL (ref 0.0–0.5)
Eosinophils Relative: 2 %
HCT: 36.8 % (ref 36.0–46.0)
Hemoglobin: 12.4 g/dL (ref 12.0–15.0)
Immature Granulocytes: 1 %
Lymphocytes Relative: 11 %
Lymphs Abs: 0.6 10*3/uL — ABNORMAL LOW (ref 0.7–4.0)
MCH: 34.4 pg — ABNORMAL HIGH (ref 26.0–34.0)
MCHC: 33.7 g/dL (ref 30.0–36.0)
MCV: 102.2 fL — ABNORMAL HIGH (ref 80.0–100.0)
Monocytes Absolute: 0.7 10*3/uL (ref 0.1–1.0)
Monocytes Relative: 15 %
Neutro Abs: 3.4 10*3/uL (ref 1.7–7.7)
Neutrophils Relative %: 70 %
Platelet Count: 114 10*3/uL — ABNORMAL LOW (ref 150–400)
RBC: 3.6 MIL/uL — ABNORMAL LOW (ref 3.87–5.11)
RDW: 14.7 % (ref 11.5–15.5)
WBC Count: 4.8 10*3/uL (ref 4.0–10.5)
nRBC: 0 % (ref 0.0–0.2)

## 2024-03-07 LAB — TSH: TSH: 2.354 u[IU]/mL (ref 0.350–4.500)

## 2024-03-07 MED ORDER — SODIUM CHLORIDE 0.9 % IV SOLN
500.0000 mg | Freq: Once | INTRAVENOUS | Status: AC
  Administered 2024-03-07: 500 mg via INTRAVENOUS
  Filled 2024-03-07: qty 20

## 2024-03-07 MED ORDER — SODIUM CHLORIDE 0.9% FLUSH
10.0000 mL | INTRAVENOUS | Status: DC | PRN
Start: 2024-03-07 — End: 2024-03-07
  Administered 2024-03-07: 10 mL

## 2024-03-07 MED ORDER — SODIUM CHLORIDE 0.9 % IV SOLN: INTRAVENOUS | Status: DC

## 2024-03-07 MED ORDER — DEXAMETHASONE SODIUM PHOSPHATE 10 MG/ML IJ SOLN
5.0000 mg | Freq: Once | INTRAMUSCULAR | Status: AC
  Administered 2024-03-07: 5 mg via INTRAVENOUS
  Filled 2024-03-07: qty 1

## 2024-03-07 MED ORDER — PALONOSETRON HCL INJECTION 0.25 MG/5ML
0.2500 mg | Freq: Once | INTRAVENOUS | Status: AC
  Administered 2024-03-07: 0.25 mg via INTRAVENOUS
  Filled 2024-03-07: qty 5

## 2024-03-07 MED ORDER — HEPARIN SOD (PORK) LOCK FLUSH 100 UNIT/ML IV SOLN
500.0000 [IU] | Freq: Once | INTRAVENOUS | Status: AC | PRN
  Administered 2024-03-07: 500 [IU]

## 2024-03-07 MED ORDER — SODIUM CHLORIDE 0.9 % IV SOLN
150.0000 mg | Freq: Once | INTRAVENOUS | Status: AC
  Administered 2024-03-07: 150 mg via INTRAVENOUS
  Filled 2024-03-07: qty 150

## 2024-03-07 MED ORDER — SODIUM CHLORIDE 0.9 % IV SOLN
384.0000 mg | Freq: Once | INTRAVENOUS | Status: AC
  Administered 2024-03-07: 380 mg via INTRAVENOUS
  Filled 2024-03-07: qty 38

## 2024-03-07 NOTE — Patient Instructions (Signed)
 [facility] A DEPT OF [facility]  Discharge Instructions: Thank you for choosing [facility] to provide your oncology and hematology care.   If you have a lab appointment with the Cancer Center, please go directly to the Cancer Center and check in at the registration area.   Wear comfortable clothing and clothing appropriate for easy access to any Portacath or PICC line.   We strive to give you quality time with your provider. You may need to reschedule your appointment if you arrive late (15 or more minutes).  Arriving late affects you and other patients whose appointments are after yours.  Also, if you miss three or more appointments without notifying the office, you may be dismissed from the clinic at the provider's discretion.      For prescription refill requests, have your pharmacy contact our office and allow 72 hours for refills to be completed.    Carboplatin Injection What is this medication? CARBOPLATIN (KAR boe pla tin) treats some types of cancer. It works by slowing down the growth of cancer cells. This medicine may be used for other purposes; ask your health care provider or pharmacist if you have questions. COMMON BRAND NAME(S): Paraplatin What should I tell my care team before I take this medication? They need to know if you have any of these conditions: Blood disorders Hearing problems Kidney disease Recent or ongoing radiation therapy An unusual or allergic reaction to carboplatin, cisplatin, other medications, foods, dyes, or preservatives Pregnant or trying to get pregnant Breast-feeding How should I use this medication? This medication is injected into a vein. It is given by your care team in a hospital or clinic setting. Talk to your care team about the use of this medication in children. Special care may be needed. Overdosage: If you think you have taken too much of this medicine contact a poison control center or  emergency room at once. NOTE: This medicine is only for you. Do not share this medicine with others. What if I miss a dose? Keep appointments for follow-up doses. It is important not to miss your dose. Call your care team if you are unable to keep an appointment. What may interact with this medication? Medications for seizures Some antibiotics, such as amikacin, gentamicin, neomycin, streptomycin, tobramycin Vaccines This list may not describe all possible interactions. Give your health care provider a list of all the medicines, herbs, non-prescription drugs, or dietary supplements you use. Also tell them if you smoke, drink alcohol, or use illegal drugs. Some items may interact with your medicine. What should I watch for while using this medication? Your condition will be monitored carefully while you are receiving this medication. You may need blood work while taking this medication. This medication may make you feel generally unwell. This is not uncommon, as chemotherapy can affect healthy cells as well as cancer cells. Report any side effects. Continue your course of treatment even though you feel ill unless your care team tells you to stop. In some cases, you may be given additional medications to help with side effects. Follow all directions for their use. This medication may increase your risk of getting an infection. Call your care team for advice if you get a fever, chills, sore throat, or other symptoms of a cold or flu. Do not treat yourself. Try to avoid being around people who are sick. Avoid taking medications that contain aspirin, acetaminophen, ibuprofen, naproxen, or ketoprofen unless instructed by  your care team. These medications may hide a fever. Be careful brushing or flossing your teeth or using a toothpick because you may get an infection or bleed more easily. If you have any dental work done, tell your dentist you are receiving this medication. Talk to your care team if you  wish to become pregnant or think you might be pregnant. This medication can cause serious birth defects. Talk to your care team about effective forms of contraception. Do not breast-feed while taking this medication. What side effects may I notice from receiving this medication? Side effects that you should report to your care team as soon as possible: Allergic reactions--skin rash, itching, hives, swelling of the face, lips, tongue, or throat Infection--fever, chills, cough, sore throat, wounds that don't heal, pain or trouble when passing urine, general feeling of discomfort or being unwell Low red blood cell level--unusual weakness or fatigue, dizziness, headache, trouble breathing Pain, tingling, or numbness in the hands or feet, muscle weakness, change in vision, confusion or trouble speaking, loss of balance or coordination, trouble walking, seizures Unusual bruising or bleeding Side effects that usually do not require medical attention (report to your care team if they continue or are bothersome): Hair loss Nausea Unusual weakness or fatigue Vomiting This list may not describe all possible side effects. Call your doctor for medical advice about side effects. You may report side effects to FDA at 1-800-FDA-1088. Where should I keep my medication? This medication is given in a hospital or clinic. It will not be stored at home. NOTE: This sheet is a summary. It may not cover all possible information. If you have questions about this medicine, talk to your doctor, pharmacist, or health care provider.  2024 Elsevier/Gold Standard (2022-03-28 00:00:00) Today you received the following chemotherapy and/or immunotherapy agents ZYNYZ and Carboplatin.  Retifanlimab Injection What is this medication? RETIFANLIMAB (RE ti FAN li mab) treats skin cancer. It works by helping your immune system slow or stop the spread of cancer cells. It is a monoclonal antibody. This medicine may be used for other  purposes; ask your health care provider or pharmacist if you have questions. COMMON BRAND NAME(S): ZYNYZ What should I tell my care team before I take this medication? They need to know if you have any of these conditions: Allogeneic stem cell transplant (uses someone else's stem cells) Autoimmune diseases, such as Crohn's disease, ulcerative colitis, lupus History of chest radiation Nervous system problems, such as Guillain-Barre syndrome or myasthenia gravis Organ transplant An unusual or allergic reaction to retifanlimab, other medications, foods, dyes, or preservatives Pregnant or trying to get pregnant Breast-feeding How should I use this medication? This medication is infused into a vein. It is given by your care team in a hospital or clinic setting. A special MedGuide will be given to you before each treatment. Be sure to read this information carefully each time. Talk to your care team about the use of this medication in children. Special care may be needed. Overdosage: If you think you have taken too much of this medicine contact a poison control center or emergency room at once. NOTE: This medicine is only for you. Do not share this medicine with others. What if I miss a dose? Keep appointments for follow-up doses. It is important not to miss your dose. Call your care team if you are unable to keep an appointment. What may interact with this medication? Interactions have not been studied. This list may not describe all possible interactions. Give  your health care provider a list of all the medicines, herbs, non-prescription drugs, or dietary supplements you use. Also tell them if you smoke, drink alcohol, or use illegal drugs. Some items may interact with your medicine. What should I watch for while using this medication? Visit your care team for regular checks on your progress. It may be some time before you see the benefit from this medication. You may need blood work while  taking this medication. This medication may cause serious skin reactions. They can happen weeks to months after starting the medication. Contact your care team right away if you notice fevers or flu-like symptoms with a rash. The rash may be red or purple and then turn into blisters or peeling of the skin. You may also notice a red rash with swelling of the face, lips, or lymph nodes in your neck or under your arms. Tell your care team right away if you have any change in your eyesight. Talk to your care team if you wish to become pregnant or think you might be pregnant. A negative pregnancy test is required before starting this medication. A reliable form of contraception is recommended while taking this medication and for 4 months after stopping therapy. Talk to your care team about effective forms of contraception. Do not breast-feed while taking this medication and for 4 months after stopping therapy. What side effects may I notice from receiving this medication? Side effects that you should report to your care team as soon as possible: Allergic reactions--skin rash, itching, hives, swelling of the face, lips, tongue, or throat Dry cough, shortness of breath or trouble breathing Eye pain, redness, irritation, or discharge with blurry or decreased vision Heart muscle inflammation--unusual weakness or fatigue, shortness of breath, chest pain, fast or irregular heartbeat, dizziness, swelling of the ankles, feet, or hands Hormone gland problems--headache, sensitivity to light, unusual weakness or fatigue, dizziness, fast or irregular heartbeat, increased sensitivity to cold or heat, excessive sweating, constipation, hair loss, increased thirst or amount of urine, tremors or shaking, irritability Infusion reactions--chest pain, shortness of breath or trouble breathing, feeling faint or lightheaded Kidney injury (glomerulonephritis)--decrease in the amount of urine, red or dark brown urine, foamy or  bubbly urine, swelling of the ankles, hands, or feet Liver injury--right upper belly pain, loss of appetite, nausea, light-colored stool, dark yellow or brown urine, yellowing skin or eyes, unusual weakness or fatigue Pain, tingling, or numbness in the hands or feet, muscle weakness, change in vision, confusion or trouble speaking, loss of balance or coordination, trouble walking, seizures Rash, fever, and swollen lymph nodes Redness, blistering, peeling, or loosening of the skin, including inside the mouth Sudden or severe stomach pain, bloody diarrhea, fever, nausea, vomiting Side effects that usually do not require medical attention (report these to your care team if they continue or are bothersome): Bone, joint, or muscle pain Diarrhea Fatigue Loss of appetite Nausea Skin rash This list may not describe all possible side effects. Call your doctor for medical advice about side effects. You may report side effects to FDA at 1-800-FDA-1088. Where should I keep my medication? This medication is given in a hospital or clinic. It will not be stored at home. NOTE: This sheet is a summary. It may not cover all possible information. If you have questions about this medicine, talk to your doctor, pharmacist, or health care provider.  2024 Elsevier/Gold Standard (2022-03-28 00:00:00)      To help prevent nausea and vomiting after your treatment, we encourage  you to take your nausea medication as directed.  BELOW ARE SYMPTOMS THAT SHOULD BE REPORTED IMMEDIATELY: *FEVER GREATER THAN 100.4 F (38 C) OR HIGHER *CHILLS OR SWEATING *NAUSEA AND VOMITING THAT IS NOT CONTROLLED WITH YOUR NAUSEA MEDICATION *UNUSUAL SHORTNESS OF BREATH *UNUSUAL BRUISING OR BLEEDING *URINARY PROBLEMS (pain or burning when urinating, or frequent urination) *BOWEL PROBLEMS (unusual diarrhea, constipation, pain near the anus) TENDERNESS IN MOUTH AND THROAT WITH OR WITHOUT PRESENCE OF ULCERS (sore throat, sores in mouth, or a  toothache) UNUSUAL RASH, SWELLING OR PAIN  UNUSUAL VAGINAL DISCHARGE OR ITCHING   Items with * indicate a potential emergency and should be followed up as soon as possible or go to the Emergency Department if any problems should occur.  Please show the CHEMOTHERAPY ALERT CARD or IMMUNOTHERAPY ALERT CARD at check-in to the Emergency Department and triage nurse.  Should you have questions after your visit or need to cancel or reschedule your appointment, please contact [facility]  Dept: [phone]  and follow the prompts.  Office hours are 8:00 a.m. to 4:30 p.m. Monday - Friday. Please note that voicemails left after 4:00 p.m. may not be returned until the following business day.  We are closed weekends and major holidays. You have access to a nurse at all times for urgent questions. Please call the main number to the clinic Dept: [phone] and follow the prompts.   For any non-urgent questions, you may also contact your provider using MyChart. We now offer e-Visits for anyone 48 and older to request care online for non-urgent symptoms. For details visit [email or web address].   Also download the MyChart app! Go to the app store, search "MyChart", open the app, select [facility], and log in with your MyChart username and password.

## 2024-03-07 NOTE — Progress Notes (Signed)
 Patient seen by Dr. Thornton Papas today  Vitals are within treatment parameters:Yes  OK to proceed w/BP 139/92 and pulse 100   Labs are within treatment parameters: No (Please specify and give further instructions.)  OK to proceed w/ALT 82  Treatment plan has been signed: Yes   Per physician team, Patient is ready for treatment. Please note the following modifications: Taxol has been discontinued along with Benadryl and Pepcid premeds

## 2024-03-07 NOTE — Progress Notes (Signed)
 Patient presents today for chemotherapy/immunotherapy infusion of ZYNYZ, Taxol, and Carboplatin. Patient is in satisfactory condition with no new complaints voiced.  Vital signs are stable.  Labs reviewed by Dr. Truett Perna during the office visit and all labs are within treatment parameters with exception of ALT 82. Okay to treat per Dr Truett Perna with exception of Taxol, Benadryl and Pepcid being discontinued. We will proceed with treatment per MD orders.   Patient tolerated treatment well with no complaints voiced.  Patient left ambulatory in stable condition.  Vital signs stable at discharge.  Follow up as scheduled.

## 2024-03-07 NOTE — Progress Notes (Signed)
 [facility] OFFICE PROGRESS NOTE   Diagnosis: Anal cancer  INTERVAL HISTORY:   Beth [name] returns as scheduled.  She was last treated with Taxol on 02/22/2024.  She complains of foul-smelling yellow/brown drainage from the nailbeds.  The fingertips/nail are tender.  No numbness.  Good appetite.  No difficulty with bowel function.  No rash or diarrhea.  She continues to have intermittent right sided abdominal pain.  Objective:  Vital signs in last 24 hours:  Blood pressure (!) 139/92, pulse 100, temperature 98.2 F (36.8 C), temperature source Temporal, resp. rate 18, height 5\' 7"  (1.702 m), weight 176 lb 14.4 oz (80.2 kg), last menstrual period 03/13/2012, SpO2 98%.    HEENT: No thrush or ulcers Resp: Lungs clear bilaterally Cardio: Regular rate and rhythm GI: No hepatosplenomegaly, left lower quadrant colostomy Vascular: No leg edema  Skin: White/yellow discoloration of the nails, mild erythema at the fingertips and toes, no drainage  Portacath/PICC-without erythema  Lab Results:  Lab Results  Component Value Date   WBC 4.8 03/07/2024   HGB 12.4 03/07/2024   HCT 36.8 03/07/2024   MCV 102.2 (H) 03/07/2024   PLT 114 (L) 03/07/2024   NEUTROABS 3.4 03/07/2024    CMP  Lab Results  Component Value Date   NA 135 02/22/2024   K 3.9 02/22/2024   CL 101 02/22/2024   CO2 25 02/22/2024   GLUCOSE 229 (H) 02/22/2024   BUN 10 02/22/2024   CREATININE 0.57 02/22/2024   CALCIUM 9.0 02/22/2024   PROT 6.6 02/22/2024   ALBUMIN 4.4 02/22/2024   AST 50 (H) 02/22/2024   ALT 111 (H) 02/22/2024   ALKPHOS 74 02/22/2024   BILITOT 0.5 02/22/2024   GFRNONAA >60 02/22/2024   GFRAA 106 11/29/2020    Lab Results  Component Value Date   CEA 1.39 10/10/2023     Medications: I have reviewed the patient's current medications.   Assessment/Plan: Squamous cell carcinoma of the anal canal Colonoscopy 01/28/2021-rectal mass extending to the "outside", palpable on  exam-biopsy revealed high-grade squamous intraepithelial lesion (AIN 3/carcinoma in situ) Exam under anesthesia with transanal excision of an anal canal mass 02/25/2021-frozen section consistent with squamous cell carcinoma, 4 x 4 centimeter sessile anal canal mass extending to the anal verge, right lateral and posterior, 30% circumferential, fixed to the anal sphincter complex; anal rectal mass with mixed high-grade neuroendocrine squamous cell carcinoma; posterior anal tag with squamous cell carcinoma; left labial lesion-condyloma acuminata CTs 03/07/2021-ill-definition of tissue planes along the anus with abnormal soft tissue prominence posteriorly along the anal canal suspicious for mass.  Small adjacent lower perirectal lymph nodes in addition to a 0.5 cm sacral node.  Focal enhancing lesion in the right hepatic lobe 1.2 x 0.8 x 0.9 cm.  2.1 x 1.5 cm cystic lesion right ovary with thin enhancing rim. PET scan 03/16/2021-hypermetabolic mass within the anal canal.  No definite evidence of local or distant metastatic disease.  7 mm left submandibular lymph node with mild FDG uptake, likely reactive.  Focal site of FDG uptake within the inner quadrant of the right breast. Radiation 03/21/2021-04/27/2021 Cycle one 5-FU/mitomycin-C 03/21/2021 Cycle two 5-FU/Mitomycin-C 04/18/2021, 5-FU dose reduced secondary to mucositis Anoscopy by Dr. Michaell Cowing 07/20/2021-divet in the right posterior lateral aspect with some right anterior and right lateral folds consistent with radiation treatment.  No ulceration.  Not fully resolved but markedly shrunk down. Anorectal mass 09/30/2021 examination under anesthesia, biopsy-poorly differentiated carcinoma-basaloid squamous carcinoma and high-grade neuroendocrine carcinoma, margins positive APR/and vertical rectus  abdominis flap 11/03/2021, invasive poorly differentiated carcinoma, negative resection margins, 0/10 nodes, treatment response score 2,ypT1,ypN0 CT abdomen/pelvis  12/03/2021-high-grade small bowel obstruction transitioning in the left lower abdomen.  Presacral rim-enhancing 4.4 x 2.8 x 3.8 cm fluid collection.  No air in the collection.  Prior study described a potential abnormality in the right lobe of the liver, not seen on current study.  Cystic lesion of the right ovary resolved. CT chest 12/07/2021-interval development of bilateral trace pleural effusions. CTs 12/04/2022-new 2.6 x 2.5 cm right lower lobe nodule PET 12/21/2022-FDG avid right lower lobe nodule, no other evidence of metastatic disease 12/29/2022 right lower lobectomy, lymph node dissection-poorly differentiated carcinoma consistent with metastasis, 4.8 x 2.9 x 2.6 cm, margins free, 7 lymph nodes negative for carcinoma; cytohistomorphology correlation supports the current tumor being a metastasis from the previously diagnosed anal carcinoma CT chest 04/17/2023-interval right lower lobectomy with resection of right lung mass, pleural thickening and small right pleural effusion, no new mass or lymph node enlargement CTs 07/20/2023-diminished postoperative right pleural effusion.  Unchanged postoperative findings status post abdominoperineal resection with left lower quadrant and colostomy.  No evidence of metastatic disease. CT abdomen/pelvis 08/31/2023-heterogenous soft tissue nodule in the right upper quadrant anterior abdominal wall, no evidence of metastatic disease CT/ultrasound guided biopsy of right abdominal wall mass 09/07/2023-metastatic neoplasm consistent with a metastasis from anal cancer, Foundation 1-HRD signature negative, MSS, tumor mutation burden 4, BRCA 1 alteration, PD-L1 TPS 10% Cycle 1 day 1 Taxol//carboplatin 10/19/2023, day 8 Taxol 10/26/2023, day 15 Taxol held 11/02/2023 due to neutropenia Cycle 2-day 1 Taxol/carboplatin plus Retifanlimab 11/14/2023, Taxol and carboplatin dose reduced due to previous neutropenia, mild thrombocytopenia; day 8 Taxol 11/22/2023, day 15 Taxol  11/29/2023 Cycle 3-day 1 Taxol/carboplatin plus retifanlimab 12/14/2023, day 8 Taxol 12/21/2023, day 15 Taxol 12/28/2023 CT abdomen/pelvis 01/09/2024: Decreased right anterior abdominal wall nodule, no evidence of disease progression Cycle 4-day 1 Taxol/carboplatin/retifanlimab 01/11/2024; day 8 Taxol 01/18/2024 (given peripherally), day 15 Taxol 01/25/2024 (given peripherally) Cycle 5-day 1 Taxol/carboplatin/retifanlimab 02/08/2024, day 8 Taxol 02/13/2024, day 15 Taxol 02/22/2024 Cycle 6-day 1 carboplatin/retifanlimib 03/07/2024, paclitaxel placed on hold secondary to nail toxicity   Remote history of an anal fissure repair Tobacco use Moderate alcohol use Eczema Right breast mass- PET scan 03/16/2021 with focal FDG activity in the inner right breast Mammogram and right breast ultrasound 03/28/2021- negative Bilateral breast MRI 03/31/2021- 7 mm mass in the upper inner right breast correlating with the PET findings, no other abnormality, no adenopathy MRI guided biopsy of right breast mass 04/11/2021- fibrocystic change with sclerosing adenosis, small focus of inflammation/fibrosis suggestive of resolving fat necrosis, no evidence of malignancy--repeat bilateral breast MRI at a 15-month interval; bilateral breast MRI on 10/13/2021-no evidence of breast malignancy.  Annual screening mammography recommended.   7.  Admission with small bowel obstruction 12/03/2021 status post exploratory laparotomy and lysis of adhesions 8.  Right abdominal pain/tenderness-potentially related to the right abdominal wall mass noted on CT 08/31/2023 9.  Elevated liver enzymes-chronic, etiology unclear 10.  Hematuria-evaluated by urology 09/11/2023, cystoscopy with radiation and infection changes, no tumor seen 11.  Right neck/chest tenderness 12/21/2023-negative Doppler study.  Recurrent symptoms 01/15/2024.  Started on a course of Augmentin.  Referred for Port-A-Cath study 01/18/2024-study completed 02/01/2024, intact tunneled right  internal jugular vein Port-A-Cath with catheter tip located just below the SVC/RA junction.  No evidence of catheter rupture, contrast extravasation or fibrin sheath. Port-A-Cath dye study 02/01/2024: Normal 12.  Chemotherapy nail toxicity-likely secondary to paclitaxel, paclitaxel placed on  hold 03/07/2024     Disposition: Ms. Dimaano has completed 5 cycles of systemic therapy for treatment of metastatic anal cancer.  Abdominal pain has improved.  She has developed nail toxicity, likely secondary to paclitaxel.  Paclitaxel will be placed on hold.  She will complete a cycle of carboplatin and retifanlimib today.  She will be instructed in local care measures for the nails.  Ms. Romey will undergo a restaging CT evaluation prior to an office visit in 2 weeks.  Thornton Papas, MD  03/07/2024  8:21 AM

## 2024-03-08 LAB — T4: T4, Total: 8.3 ug/dL (ref 4.5–12.0)

## 2024-03-10 ENCOUNTER — Telehealth: Payer: Self-pay | Admitting: *Deleted

## 2024-03-10 NOTE — Telephone Encounter (Signed)
 Wanted MD aware that she has started to have some intermittent numbness in her right foot. Asking if any intervention needs to occur?

## 2024-03-10 NOTE — Telephone Encounter (Signed)
 Called Jahnaya back and left VM that 3/28 appointments were removed and no intervention for intermittent numbness in foot. Keep MD posted at next visit in case changes need to occur w/her Taxol.

## 2024-03-14 ENCOUNTER — Ambulatory Visit
Admission: RE | Admit: 2024-03-14 | Discharge: 2024-03-14 | Disposition: A | Discharge status: Home / Self Care | Source: Ambulatory Visit | Attending: Nurse Practitioner | Admitting: Nurse Practitioner

## 2024-03-14 ENCOUNTER — Other Ambulatory Visit

## 2024-03-14 ENCOUNTER — Other Ambulatory Visit: Payer: BC Managed Care – PPO

## 2024-03-14 ENCOUNTER — Ambulatory Visit: Payer: BC Managed Care – PPO

## 2024-03-14 ENCOUNTER — Ambulatory Visit: Payer: BC Managed Care – PPO | Admitting: Nurse Practitioner

## 2024-03-14 DIAGNOSIS — C210 Malignant neoplasm of anus, unspecified: Secondary | ICD-10-CM

## 2024-03-14 MED ORDER — IOPAMIDOL (ISOVUE-300) INJECTION 61%
100.0000 mL | Freq: Once | INTRAVENOUS | Status: AC | PRN
  Administered 2024-03-14: 100 mL via INTRAVENOUS

## 2024-03-18 ENCOUNTER — Other Ambulatory Visit: Payer: Self-pay | Admitting: *Deleted

## 2024-03-18 DIAGNOSIS — C210 Malignant neoplasm of anus, unspecified: Secondary | ICD-10-CM

## 2024-03-21 ENCOUNTER — Other Ambulatory Visit: Payer: Self-pay | Admitting: *Deleted

## 2024-03-21 ENCOUNTER — Inpatient Hospital Stay: Payer: BC Managed Care – PPO | Admitting: Oncology

## 2024-03-21 ENCOUNTER — Inpatient Hospital Stay: Payer: BC Managed Care – PPO

## 2024-03-21 ENCOUNTER — Inpatient Hospital Stay: Payer: BC Managed Care – PPO | Attending: Oncology

## 2024-03-21 DIAGNOSIS — C210 Malignant neoplasm of anus, unspecified: Secondary | ICD-10-CM | POA: Diagnosis not present

## 2024-03-21 DIAGNOSIS — Z95828 Presence of other vascular implants and grafts: Secondary | ICD-10-CM

## 2024-03-21 DIAGNOSIS — Z5112 Encounter for antineoplastic immunotherapy: Secondary | ICD-10-CM | POA: Insufficient documentation

## 2024-03-21 DIAGNOSIS — Z79620 Long term (current) use of immunosuppressive biologic: Secondary | ICD-10-CM | POA: Insufficient documentation

## 2024-03-21 LAB — CMP (CANCER CENTER ONLY)
ALT: 104 U/L — ABNORMAL HIGH (ref 0–44)
AST: 55 U/L — ABNORMAL HIGH (ref 15–41)
Albumin: 4.2 g/dL (ref 3.5–5.0)
Alkaline Phosphatase: 79 U/L (ref 38–126)
Anion gap: 10 (ref 5–15)
BUN: 12 mg/dL (ref 6–20)
CO2: 25 mmol/L (ref 22–32)
Calcium: 9.4 mg/dL (ref 8.9–10.3)
Chloride: 100 mmol/L (ref 98–111)
Creatinine: 0.67 mg/dL (ref 0.44–1.00)
GFR, Estimated: >60 mL/min (ref >60)
Glucose, Bld: 315 mg/dL — ABNORMAL HIGH (ref 70–99)
Potassium: 3.8 mmol/L (ref 3.5–5.1)
Sodium: 135 mmol/L (ref 135–145)
Total Bilirubin: 0.7 mg/dL (ref 0.0–1.2)
Total Protein: 6.8 g/dL (ref 6.5–8.1)

## 2024-03-21 LAB — CBC WITH DIFFERENTIAL (CANCER CENTER ONLY)
Abs Immature Granulocytes: 0.02 10*3/uL (ref 0.00–0.07)
Basophils Absolute: 0 10*3/uL (ref 0.0–0.1)
Basophils Relative: 0 %
Eosinophils Absolute: 0.2 10*3/uL (ref 0.0–0.5)
Eosinophils Relative: 3 %
HCT: 38.4 % (ref 36.0–46.0)
Hemoglobin: 12.8 g/dL (ref 12.0–15.0)
Immature Granulocytes: 0 %
Lymphocytes Relative: 9 %
Lymphs Abs: 0.6 10*3/uL — ABNORMAL LOW (ref 0.7–4.0)
MCH: 34.4 pg — ABNORMAL HIGH (ref 26.0–34.0)
MCHC: 33.3 g/dL (ref 30.0–36.0)
MCV: 103.2 fL — ABNORMAL HIGH (ref 80.0–100.0)
Monocytes Absolute: 0.6 10*3/uL (ref 0.1–1.0)
Monocytes Relative: 10 %
Neutro Abs: 5.1 10*3/uL (ref 1.7–7.7)
Neutrophils Relative %: 78 %
Platelet Count: 122 10*3/uL — ABNORMAL LOW (ref 150–400)
RBC: 3.72 MIL/uL — ABNORMAL LOW (ref 3.87–5.11)
RDW: 14.3 % (ref 11.5–15.5)
WBC Count: 6.5 10*3/uL (ref 4.0–10.5)
nRBC: 0 % (ref 0.0–0.2)

## 2024-03-21 MED ORDER — SODIUM CHLORIDE 0.9% FLUSH
10.0000 mL | INTRAVENOUS | Status: DC | PRN
  Administered 2024-03-21: 10 mL via INTRAVENOUS

## 2024-03-21 MED ORDER — DOXYCYCLINE HYCLATE 100 MG PO TABS: 100.0000 mg | ORAL_TABLET | Freq: Two times a day (BID) | ORAL | 0 refills | Status: DC

## 2024-03-21 MED ORDER — OXYCODONE HCL 5 MG PO TABS: 2.5000 mg | ORAL_TABLET | ORAL | 0 refills | Status: DC | PRN

## 2024-03-21 MED ORDER — FLUCONAZOLE 150 MG PO TABS: 150.0000 mg | ORAL_TABLET | Freq: Once | ORAL | 0 refills | Status: AC

## 2024-03-21 MED ORDER — HEPARIN SOD (PORK) LOCK FLUSH 100 UNIT/ML IV SOLN
500.0000 [IU] | Freq: Once | INTRAVENOUS | Status: AC
  Administered 2024-03-21: 500 [IU] via INTRAVENOUS

## 2024-03-21 NOTE — Progress Notes (Signed)
 [facility] OFFICE PROGRESS NOTE   Diagnosis: Anal cancer  INTERVAL HISTORY:   Beth [name] returns as scheduled.  She was treated with retifanlimib and carboplatin on 03/07/2024.  No rash or diarrhea.  She continues to have discomfort, drainage, and breakdown at the nails.  This has not improved.  She has increased constant discomfort in the right mid abdomen.  She takes oxycodone as needed for pain in the abdomen and nails.  No peripheral numbness.  She is working.  Objective:  Vital signs in last 24 hours:  Blood pressure (!) 136/96, pulse 100, temperature 98.1 F (36.7 C), temperature source Temporal, resp. rate 18, height 5\' 7"  (1.702 m), weight 176 lb 8 oz (80.1 kg), last menstrual period 03/13/2012, SpO2 100%.    Resp: Lungs clear bilaterally Cardio: Regular rate and rhythm GI: No hepatosplenomegaly, no mass, low abdomen colostomy, mild tenderness in the right mid abdomen Vascular: No leg edema  Skin: Serous drainage with nail loosening and breakdown at the fingers and toes  Portacath/PICC-without erythema  Lab Results:  Lab Results  Component Value Date   WBC 6.5 03/21/2024   HGB 12.8 03/21/2024   HCT 38.4 03/21/2024   MCV 103.2 (H) 03/21/2024   PLT 122 (L) 03/21/2024   NEUTROABS 5.1 03/21/2024    CMP  Lab Results  Component Value Date   NA 135 03/21/2024   K 3.8 03/21/2024   CL 100 03/21/2024   CO2 25 03/21/2024   GLUCOSE 315 (H) 03/21/2024   BUN 12 03/21/2024   CREATININE 0.67 03/21/2024   CALCIUM 9.4 03/21/2024   PROT 6.8 03/21/2024   ALBUMIN 4.2 03/21/2024   AST 55 (H) 03/21/2024   ALT 104 (H) 03/21/2024   ALKPHOS 79 03/21/2024   BILITOT 0.7 03/21/2024   GFRNONAA >60 03/21/2024   GFRAA 106 11/29/2020    Lab Results  Component Value Date   CEA 1.39 10/10/2023     Medications: I have reviewed the patient's current medications.   Assessment/Plan: Squamous cell carcinoma of the anal canal Colonoscopy 01/28/2021-rectal mass  extending to the "outside", palpable on exam-biopsy revealed high-grade squamous intraepithelial lesion (AIN 3/carcinoma in situ) Exam under anesthesia with transanal excision of an anal canal mass 02/25/2021-frozen section consistent with squamous cell carcinoma, 4 x 4 centimeter sessile anal canal mass extending to the anal verge, right lateral and posterior, 30% circumferential, fixed to the anal sphincter complex; anal rectal mass with mixed high-grade neuroendocrine squamous cell carcinoma; posterior anal tag with squamous cell carcinoma; left labial lesion-condyloma acuminata CTs 03/07/2021-ill-definition of tissue planes along the anus with abnormal soft tissue prominence posteriorly along the anal canal suspicious for mass.  Small adjacent lower perirectal lymph nodes in addition to a 0.5 cm sacral node.  Focal enhancing lesion in the right hepatic lobe 1.2 x 0.8 x 0.9 cm.  2.1 x 1.5 cm cystic lesion right ovary with thin enhancing rim. PET scan 03/16/2021-hypermetabolic mass within the anal canal.  No definite evidence of local or distant metastatic disease.  7 mm left submandibular lymph node with mild FDG uptake, likely reactive.  Focal site of FDG uptake within the inner quadrant of the right breast. Radiation 03/21/2021-04/27/2021 Cycle one 5-FU/mitomycin-C 03/21/2021 Cycle two 5-FU/Mitomycin-C 04/18/2021, 5-FU dose reduced secondary to mucositis Anoscopy by Dr. Michaell Cowing 07/20/2021-divet in the right posterior lateral aspect with some right anterior and right lateral folds consistent with radiation treatment.  No ulceration.  Not fully resolved but markedly shrunk down. Anorectal mass 09/30/2021 examination under anesthesia,  biopsy-poorly differentiated carcinoma-basaloid squamous carcinoma and high-grade neuroendocrine carcinoma, margins positive APR/and vertical rectus abdominis flap 11/03/2021, invasive poorly differentiated carcinoma, negative resection margins, 0/10 nodes, treatment response score  2,ypT1,ypN0 CT abdomen/pelvis 12/03/2021-high-grade small bowel obstruction transitioning in the left lower abdomen.  Presacral rim-enhancing 4.4 x 2.8 x 3.8 cm fluid collection.  No air in the collection.  Prior study described a potential abnormality in the right lobe of the liver, not seen on current study.  Cystic lesion of the right ovary resolved. CT chest 12/07/2021-interval development of bilateral trace pleural effusions. CTs 12/04/2022-new 2.6 x 2.5 cm right lower lobe nodule PET 12/21/2022-FDG avid right lower lobe nodule, no other evidence of metastatic disease 12/29/2022 right lower lobectomy, lymph node dissection-poorly differentiated carcinoma consistent with metastasis, 4.8 x 2.9 x 2.6 cm, margins free, 7 lymph nodes negative for carcinoma; cytohistomorphology correlation supports the current tumor being a metastasis from the previously diagnosed anal carcinoma CT chest 04/17/2023-interval right lower lobectomy with resection of right lung mass, pleural thickening and small right pleural effusion, no new mass or lymph node enlargement CTs 07/20/2023-diminished postoperative right pleural effusion.  Unchanged postoperative findings status post abdominoperineal resection with left lower quadrant and colostomy.  No evidence of metastatic disease. CT abdomen/pelvis 08/31/2023-heterogenous soft tissue nodule in the right upper quadrant anterior abdominal wall, no evidence of metastatic disease CT/ultrasound guided biopsy of right abdominal wall mass 09/07/2023-metastatic neoplasm consistent with a metastasis from anal cancer, Foundation 1-HRD signature negative, MSS, tumor mutation burden 4, BRCA 1 alteration, PD-L1 TPS 10% Cycle 1 day 1 Taxol//carboplatin 10/19/2023, day 8 Taxol 10/26/2023, day 15 Taxol held 11/02/2023 due to neutropenia Cycle 2-day 1 Taxol/carboplatin plus Retifanlimab 11/14/2023, Taxol and carboplatin dose reduced due to previous neutropenia, mild thrombocytopenia; day 8 Taxol  11/22/2023, day 15 Taxol 11/29/2023 Cycle 3-day 1 Taxol/carboplatin plus retifanlimab 12/14/2023, day 8 Taxol 12/21/2023, day 15 Taxol 12/28/2023 CT abdomen/pelvis 01/09/2024: Decreased right anterior abdominal wall nodule, no evidence of disease progression Cycle 4-day 1 Taxol/carboplatin/retifanlimab 01/11/2024; day 8 Taxol 01/18/2024 (given peripherally), day 15 Taxol 01/25/2024 (given peripherally) Cycle 5-day 1 Taxol/carboplatin/retifanlimab 02/08/2024, day 8 Taxol 02/13/2024, day 15 Taxol 02/22/2024 Cycle 6-day 1 carboplatin/retifanlimib 03/07/2024, paclitaxel placed on hold secondary to nail toxicity CT 03/14/2024: Stable right anterior abdominal wall nodule, no evidence of progressive metastatic disease, stable small nodes in the upper retroperitoneum and mediastinum   Remote history of an anal fissure repair Tobacco use Moderate alcohol use Eczema Right breast mass- PET scan 03/16/2021 with focal FDG activity in the inner right breast Mammogram and right breast ultrasound 03/28/2021- negative Bilateral breast MRI 03/31/2021- 7 mm mass in the upper inner right breast correlating with the PET findings, no other abnormality, no adenopathy MRI guided biopsy of right breast mass 04/11/2021- fibrocystic change with sclerosing adenosis, small focus of inflammation/fibrosis suggestive of resolving fat necrosis, no evidence of malignancy--repeat bilateral breast MRI at a 93-month interval; bilateral breast MRI on 10/13/2021-no evidence of breast malignancy.  Annual screening mammography recommended.   7.  Admission with small bowel obstruction 12/03/2021 status post exploratory laparotomy and lysis of adhesions 8.  Right abdominal pain/tenderness-potentially related to the right abdominal wall mass noted on CT 08/31/2023 9.  Elevated liver enzymes-chronic, etiology unclear 10.  Hematuria-evaluated by urology 09/11/2023, cystoscopy with radiation and infection changes, no tumor seen 11.  Right neck/chest tenderness  12/21/2023-negative Doppler study.  Recurrent symptoms 01/15/2024.  Started on a course of Augmentin.  Referred for Port-A-Cath study 01/18/2024-study completed 02/01/2024, intact tunneled right internal jugular vein  Port-A-Cath with catheter tip located just below the SVC/RA junction.  No evidence of catheter rupture, contrast extravasation or fibrin sheath. Port-A-Cath dye study 02/01/2024: Normal 12.  Chemotherapy nail toxicity-likely secondary to paclitaxel, paclitaxel placed on hold 03/07/2024      Disposition: Beth [name] has metastatic anal cancer.  She has completed 6 cycles of systemic therapy.  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  She has been performing soaks without significant improvement.  She will complete a course of doxycycline.  The restaging CTs reveal no evidence of disease progression.  I reviewed the CT findings and images with her.  The right abdominal wall mass is unchanged.  There is no evidence of disease progression.  She has been treated with chemotherapy/immunotherapy for the past 5 months.  We decided to hold chemotherapy and continue monthly retifanlimib.  She will return for retifanlimib on 04/04/2024.  We will plan for a restaging CT abdomen in 2-3 months.  Thornton Papas, MD  03/21/2024  10:07 AM

## 2024-03-21 NOTE — Progress Notes (Signed)
 Patient seen by Dr. Thornton Papas today  Vitals are within treatment parameters:No (Please specify and give further instructions.) BP 136/96 (her usual)  Labs are within treatment parameters: No (Please specify and give further instructions.)  ALT 104 and glucose 315  Treatment plan has been signed: No   Per physician team, Patient will not be receiving treatment today.  Due to persistent nail issues.

## 2024-03-28 ENCOUNTER — Inpatient Hospital Stay: Admission: RE | Admit: 2024-03-28 | Source: Ambulatory Visit

## 2024-03-28 ENCOUNTER — Other Ambulatory Visit

## 2024-03-31 ENCOUNTER — Telehealth: Payer: Self-pay | Admitting: Oncology

## 2024-03-31 NOTE — Telephone Encounter (Signed)
 Patient has been scheduled. Aware of appt date and time.

## 2024-03-31 NOTE — Telephone Encounter (Signed)
-----   Message from Nurse Algis Anton F sent at 03/31/2024  9:36 AM EDT ----- Regarding: appointment Good morning, patient called to have an appointment scheduled with Dr.Sherrill for May 16,2025

## 2024-04-01 ENCOUNTER — Other Ambulatory Visit: Payer: Self-pay

## 2024-04-04 ENCOUNTER — Inpatient Hospital Stay: Admitting: Oncology

## 2024-04-04 ENCOUNTER — Other Ambulatory Visit

## 2024-04-04 ENCOUNTER — Telehealth: Payer: Self-pay | Admitting: Oncology

## 2024-04-04 ENCOUNTER — Inpatient Hospital Stay

## 2024-04-04 ENCOUNTER — Ambulatory Visit: Admitting: Oncology

## 2024-04-04 ENCOUNTER — Ambulatory Visit

## 2024-04-04 VITALS — BP 126/92 | HR 85

## 2024-04-04 VITALS — BP 134/98 | HR 90 | Temp 98.2°F | Resp 18 | Ht 67.0 in | Wt 175.4 lb

## 2024-04-04 DIAGNOSIS — C210 Malignant neoplasm of anus, unspecified: Secondary | ICD-10-CM

## 2024-04-04 LAB — CBC WITH DIFFERENTIAL (CANCER CENTER ONLY)
Abs Immature Granulocytes: 0.01 10*3/uL (ref 0.00–0.07)
Basophils Absolute: 0 10*3/uL (ref 0.0–0.1)
Basophils Relative: 1 %
Eosinophils Absolute: 0.3 10*3/uL (ref 0.0–0.5)
Eosinophils Relative: 5 %
HCT: 39.4 % (ref 36.0–46.0)
Hemoglobin: 13.3 g/dL (ref 12.0–15.0)
Immature Granulocytes: 0 %
Lymphocytes Relative: 9 %
Lymphs Abs: 0.5 10*3/uL — ABNORMAL LOW (ref 0.7–4.0)
MCH: 34.3 pg — ABNORMAL HIGH (ref 26.0–34.0)
MCHC: 33.8 g/dL (ref 30.0–36.0)
MCV: 101.5 fL — ABNORMAL HIGH (ref 80.0–100.0)
Monocytes Absolute: 0.6 10*3/uL (ref 0.1–1.0)
Monocytes Relative: 11 %
Neutro Abs: 4.3 10*3/uL (ref 1.7–7.7)
Neutrophils Relative %: 74 %
Platelet Count: 101 10*3/uL — ABNORMAL LOW (ref 150–400)
RBC: 3.88 MIL/uL (ref 3.87–5.11)
RDW: 13.5 % (ref 11.5–15.5)
WBC Count: 5.7 10*3/uL (ref 4.0–10.5)
nRBC: 0 % (ref 0.0–0.2)

## 2024-04-04 LAB — CMP (CANCER CENTER ONLY)
ALT: 109 U/L — ABNORMAL HIGH (ref 0–44)
AST: 58 U/L — ABNORMAL HIGH (ref 15–41)
Albumin: 4.2 g/dL (ref 3.5–5.0)
Alkaline Phosphatase: 84 U/L (ref 38–126)
Anion gap: 8 (ref 5–15)
BUN: 10 mg/dL (ref 6–20)
CO2: 26 mmol/L (ref 22–32)
Calcium: 9.7 mg/dL (ref 8.9–10.3)
Chloride: 101 mmol/L (ref 98–111)
Creatinine: 0.53 mg/dL (ref 0.44–1.00)
GFR, Estimated: >60 mL/min (ref >60)
Glucose, Bld: 199 mg/dL — ABNORMAL HIGH (ref 70–99)
Potassium: 4 mmol/L (ref 3.5–5.1)
Sodium: 135 mmol/L (ref 135–145)
Total Bilirubin: 0.7 mg/dL (ref 0.0–1.2)
Total Protein: 6.8 g/dL (ref 6.5–8.1)

## 2024-04-04 MED ORDER — LIDOCAINE-PRILOCAINE 2.5-2.5 % EX CREA: 1.0000 | TOPICAL_CREAM | CUTANEOUS | 2 refills | Status: AC | PRN

## 2024-04-04 MED ORDER — OXYCODONE HCL 5 MG PO TABS: 2.5000 mg | ORAL_TABLET | ORAL | 0 refills | Status: DC | PRN

## 2024-04-04 MED ORDER — SODIUM CHLORIDE 0.9 % IV SOLN
500.0000 mg | Freq: Once | INTRAVENOUS | Status: AC
  Administered 2024-04-04: 500 mg via INTRAVENOUS
  Filled 2024-04-04: qty 20

## 2024-04-04 MED ORDER — SODIUM CHLORIDE 0.9% FLUSH
10.0000 mL | INTRAVENOUS | Status: DC | PRN
Start: 2024-04-04 — End: 2024-04-04
  Administered 2024-04-04: 10 mL

## 2024-04-04 MED ORDER — HEPARIN SOD (PORK) LOCK FLUSH 100 UNIT/ML IV SOLN
500.0000 [IU] | Freq: Once | INTRAVENOUS | Status: AC | PRN
Start: 2024-04-04 — End: 2024-04-04
  Administered 2024-04-04: 500 [IU]

## 2024-04-04 MED ORDER — SODIUM CHLORIDE 0.9 % IV SOLN
INTRAVENOUS | Status: DC
Start: 2024-04-04 — End: 2024-04-04

## 2024-04-04 NOTE — Progress Notes (Signed)
 Patient seen by Dr. Coni Deep today  Vitals are within treatment parameters:Yes OK to proceed w/BP 134/98  Labs are within treatment parameters: No (Please specify and give further instructions.) ALT 109--OK to proceed  Treatment plan has been signed: Yes   Per physician team, Patient is ready for treatment and there are NO modifications to the treatment plan.

## 2024-04-04 NOTE — Patient Instructions (Signed)
 [facility] - A DEPT OF [facility]    Discharge Instructions: Thank you for choosing [facility] to provide your oncology and hematology care.   If you have a lab appointment with the Cancer Center, please go directly to the Cancer Center and check in at the registration area.   Wear comfortable clothing and clothing appropriate for easy access to any Portacath or PICC line.   We strive to give you quality time with your provider. You may need to reschedule your appointment if you arrive late (15 or more minutes).  Arriving late affects you and other patients whose appointments are after yours.  Also, if you miss three or more appointments without notifying the office, you may be dismissed from the clinic at the provider's discretion.      For prescription refill requests, have your pharmacy contact our office and allow 72 hours for refills to be completed.    Today you received the following chemotherapy and/or immunotherapy agents Zynyz       To help prevent nausea and vomiting after your treatment, we encourage you to take your nausea medication as directed.  BELOW ARE SYMPTOMS THAT SHOULD BE REPORTED IMMEDIATELY: *FEVER GREATER THAN 100.4 F (38 C) OR HIGHER *CHILLS OR SWEATING *NAUSEA AND VOMITING THAT IS NOT CONTROLLED WITH YOUR NAUSEA MEDICATION *UNUSUAL SHORTNESS OF BREATH *UNUSUAL BRUISING OR BLEEDING *URINARY PROBLEMS (pain or burning when urinating, or frequent urination) *BOWEL PROBLEMS (unusual diarrhea, constipation, pain near the anus) TENDERNESS IN MOUTH AND THROAT WITH OR WITHOUT PRESENCE OF ULCERS (sore throat, sores in mouth, or a toothache) UNUSUAL RASH, SWELLING OR PAIN  UNUSUAL VAGINAL DISCHARGE OR ITCHING   Items with * indicate a potential emergency and should be followed up as soon as possible or go to the Emergency Department if any problems should occur.  Please show the CHEMOTHERAPY ALERT CARD or IMMUNOTHERAPY  ALERT CARD at check-in to the Emergency Department and triage nurse.  Should you have questions after your visit or need to cancel or reschedule your appointment, please contact [facility] - A DEPT OF [facility]  Dept: [phone]  and follow the prompts.  Office hours are 8:00 a.m. to 4:30 p.m. Monday - Friday. Please note that voicemails left after 4:00 p.m. may not be returned until the following business day.  We are closed weekends and major holidays. You have access to a nurse at all times for urgent questions. Please call the main number to the clinic Dept: [phone] and follow the prompts.   For any non-urgent questions, you may also contact your provider using MyChart. We now offer e-Visits for anyone 87 and older to request care online for non-urgent symptoms. For details visit [email or web address].   Also download the MyChart app! Go to the app store, search "MyChart", open the app, select [facility], and log in with your MyChart username and password.  Retifanlimab  Injection What is this medication? RETIFANLIMAB  (RE ti FAN li mab) treats skin cancer. It works by helping your immune system slow or stop the spread of cancer cells. It is a monoclonal antibody. This medicine may be used for other purposes; ask your health care provider or pharmacist if you have questions. COMMON BRAND NAME(S): ZYNYZ  What should I tell my care team before I take this medication? They need to know if you have any of these conditions: Allogeneic stem cell transplant (uses someone else's stem cells) Autoimmune diseases, such as  Crohn's disease, ulcerative colitis, lupus History of chest radiation Nervous system problems, such as Guillain-Barre syndrome or myasthenia gravis Organ transplant An unusual or allergic reaction to retifanlimab , other medications, foods, dyes, or preservatives Pregnant or trying to get pregnant Breast-feeding How should I use this  medication? This medication is infused into a vein. It is given by your care team in a hospital or clinic setting. A special MedGuide will be given to you before each treatment. Be sure to read this information carefully each time. Talk to your care team about the use of this medication in children. Special care may be needed. Overdosage: If you think you have taken too much of this medicine contact a poison control center or emergency room at once. NOTE: This medicine is only for you. Do not share this medicine with others. What if I miss a dose? Keep appointments for follow-up doses. It is important not to miss your dose. Call your care team if you are unable to keep an appointment. What may interact with this medication? Interactions have not been studied. This list may not describe all possible interactions. Give your health care provider a list of all the medicines, herbs, non-prescription drugs, or dietary supplements you use. Also tell them if you smoke, drink alcohol, or use illegal drugs. Some items may interact with your medicine. What should I watch for while using this medication? Visit your care team for regular checks on your progress. It may be some time before you see the benefit from this medication. You may need blood work while taking this medication. This medication may cause serious skin reactions. They can happen weeks to months after starting the medication. Contact your care team right away if you notice fevers or flu-like symptoms with a rash. The rash may be red or purple and then turn into blisters or peeling of the skin. You may also notice a red rash with swelling of the face, lips, or lymph nodes in your neck or under your arms. Tell your care team right away if you have any change in your eyesight. Talk to your care team if you wish to become pregnant or think you might be pregnant. A negative pregnancy test is required before starting this medication. A reliable form  of contraception is recommended while taking this medication and for 4 months after stopping therapy. Talk to your care team about effective forms of contraception. Do not breast-feed while taking this medication and for 4 months after stopping therapy. What side effects may I notice from receiving this medication? Side effects that you should report to your care team as soon as possible: Allergic reactions--skin rash, itching, hives, swelling of the face, lips, tongue, or throat Dry cough, shortness of breath or trouble breathing Eye pain, redness, irritation, or discharge with blurry or decreased vision Heart muscle inflammation--unusual weakness or fatigue, shortness of breath, chest pain, fast or irregular heartbeat, dizziness, swelling of the ankles, feet, or hands Hormone gland problems--headache, sensitivity to light, unusual weakness or fatigue, dizziness, fast or irregular heartbeat, increased sensitivity to cold or heat, excessive sweating, constipation, hair loss, increased thirst or amount of urine, tremors or shaking, irritability Infusion reactions--chest pain, shortness of breath or trouble breathing, feeling faint or lightheaded Kidney injury (glomerulonephritis)--decrease in the amount of urine, red or dark brown urine, foamy or bubbly urine, swelling of the ankles, hands, or feet Liver injury--right upper belly pain, loss of appetite, nausea, light-colored stool, dark yellow or brown urine, yellowing skin or eyes,  unusual weakness or fatigue Pain, tingling, or numbness in the hands or feet, muscle weakness, change in vision, confusion or trouble speaking, loss of balance or coordination, trouble walking, seizures Rash, fever, and swollen lymph nodes Redness, blistering, peeling, or loosening of the skin, including inside the mouth Sudden or severe stomach pain, bloody diarrhea, fever, nausea, vomiting Side effects that usually do not require medical attention (report these to your  care team if they continue or are bothersome): Bone, joint, or muscle pain Diarrhea Fatigue Loss of appetite Nausea Skin rash This list may not describe all possible side effects. Call your doctor for medical advice about side effects. You may report side effects to FDA at 1-800-FDA-1088. Where should I keep my medication? This medication is given in a hospital or clinic. It will not be stored at home. NOTE: This sheet is a summary. It may not cover all possible information. If you have questions about this medicine, talk to your doctor, pharmacist, or health care provider.  2024 Elsevier/Gold Standard (2022-03-28 00:00:00)

## 2024-04-04 NOTE — Progress Notes (Signed)
 Beth [name]   Diagnosis: Anal cancer  INTERVAL HISTORY:   Beth [name] returns as scheduled.  She continues to have discoloration, loosening, and drainage at the nailbeds.  No improvement following the course of doxycycline .  The left great toe was painful. Her chief complaint is abdominal pain.  She has consistent pain in the right subcostal region.  She takes oxycodone  3-4 times per day.  She is working.  Objective:  Vital signs in last 24 hours:  Blood pressure (!) 134/98, pulse 90, temperature 98.2 F (36.8 C), temperature source Temporal, resp. rate 18, height 5' 7 (1.702 m), weight 175 lb 6.4 oz (79.6 kg), last menstrual period 03/13/2012, SpO2 100%.    HEENT: No thrush or ulcers Resp: Lungs clear bilaterally Cardio: Regular rate and rhythm GI: No hepatosplenomegaly, no mass, tender in the right subcostal region, left lower quadrant colostomy Vascular: No leg edema  Skin: Discoloration of the fingernails-no drainage, discoloration of the left great toenail with loosening of the toenail and subungual fluid.  There is induration/erythema surrounding the left great toenail  Portacath/PICC-without erythema  Lab Results:  Lab Results  Component Value Date   WBC 5.7 04/04/2024   HGB 13.3 04/04/2024   HCT 39.4 04/04/2024   MCV 101.5 (H) 04/04/2024   PLT 101 (L) 04/04/2024   NEUTROABS 4.3 04/04/2024    CMP  Lab Results  Component Value Date   NA 135 04/04/2024   K 4.0 04/04/2024   CL 101 04/04/2024   CO2 26 04/04/2024   GLUCOSE 199 (H) 04/04/2024   BUN 10 04/04/2024   CREATININE 0.53 04/04/2024   CALCIUM  9.7 04/04/2024   PROT 6.8 04/04/2024   ALBUMIN  4.2 04/04/2024   AST 58 (H) 04/04/2024   ALT 109 (H) 04/04/2024   ALKPHOS 84 04/04/2024   BILITOT 0.7 04/04/2024   GFRNONAA >60 04/04/2024   GFRAA 106 11/29/2020    Lab Results  Component Value Date   CEA 1.39 10/10/2023    Lab Results  Component Value Date   INR 1.1  09/07/2023   LABPROT 14.1 09/07/2023    Imaging:  No results found.  Medications: I have reviewed the patient's current medications.   Assessment/Plan:  Squamous cell carcinoma of the anal canal Colonoscopy 01/28/2021-rectal mass extending to the outside, palpable on exam-biopsy revealed high-grade squamous intraepithelial lesion (AIN 3/carcinoma in situ) Exam under anesthesia with transanal excision of an anal canal mass 02/25/2021-frozen section consistent with squamous cell carcinoma, 4 x 4 centimeter sessile anal canal mass extending to the anal verge, right lateral and posterior, 30% circumferential, fixed to the anal sphincter complex; anal rectal mass with mixed high-grade neuroendocrine squamous cell carcinoma; posterior anal tag with squamous cell carcinoma; left labial lesion-condyloma acuminata CTs 03/07/2021-ill-definition of tissue planes along the anus with abnormal soft tissue prominence posteriorly along the anal canal suspicious for mass.  Small adjacent lower perirectal lymph nodes in addition to a 0.5 cm sacral node.  Focal enhancing lesion in the right hepatic lobe 1.2 x 0.8 x 0.9 cm.  2.1 x 1.5 cm cystic lesion right ovary with thin enhancing rim. PET scan 03/16/2021-hypermetabolic mass within the anal canal.  No definite evidence of local or distant metastatic disease.  7 mm left submandibular lymph node with mild FDG uptake, likely reactive.  Focal site of FDG uptake within the inner quadrant of the right breast. Radiation 03/21/2021-04/27/2021 Cycle one 5-FU/mitomycin -C 03/21/2021 Cycle two 5-FU/Mitomycin -C 04/18/2021, 5-FU dose reduced secondary to mucositis Anoscopy by Dr.  Gross 07/20/2021-divet in the right posterior lateral aspect with some right anterior and right lateral folds consistent with radiation treatment.  No ulceration.  Not fully resolved but markedly shrunk down. Anorectal mass 09/30/2021 examination under anesthesia, biopsy-poorly differentiated carcinoma-basaloid  squamous carcinoma and high-grade neuroendocrine carcinoma, margins positive APR/and vertical rectus abdominis flap 11/03/2021, invasive poorly differentiated carcinoma, negative resection margins, 0/10 nodes, treatment response score 2,ypT1,ypN0 CT abdomen/pelvis 12/03/2021-high-grade small bowel obstruction transitioning in the left lower abdomen.  Presacral rim-enhancing 4.4 x 2.8 x 3.8 cm fluid collection.  No air in the collection.  Prior study described a potential abnormality in the right lobe of the liver, not seen on current study.  Cystic lesion of the right ovary resolved. 12/04/2021: Lysis of adhesions secondary to internal hernia CT chest 12/07/2021-interval development of bilateral trace pleural effusions. CTs 12/04/2022-new 2.6 x 2.5 cm right lower lobe nodule PET 12/21/2022-FDG avid right lower lobe nodule, no other evidence of metastatic disease 12/29/2022 right lower lobectomy, lymph node dissection-poorly differentiated carcinoma consistent with metastasis, 4.8 x 2.9 x 2.6 cm, margins free, 7 lymph nodes negative for carcinoma; cytohistomorphology correlation supports the current tumor being a metastasis from the previously diagnosed anal carcinoma CT chest 04/17/2023-interval right lower lobectomy with resection of right lung mass, pleural thickening and small right pleural effusion, no new mass or lymph node enlargement CTs 07/20/2023-diminished postoperative right pleural effusion.  Unchanged postoperative findings status post abdominoperineal resection with left lower quadrant and colostomy.  No evidence of metastatic disease. CT abdomen/pelvis 08/31/2023-heterogenous soft tissue nodule in the right upper quadrant anterior abdominal wall, no evidence of metastatic disease CT/ultrasound guided biopsy of right abdominal wall mass 09/07/2023-metastatic neoplasm consistent with a metastasis from anal cancer, Foundation 1-HRD signature negative, MSS, tumor mutation burden 4, BRCA 1 alteration,  PD-L1 TPS 10% Cycle 1 day 1 Taxol //carboplatin  10/19/2023, day 8 Taxol  10/26/2023, day 15 Taxol  held 11/02/2023 due to neutropenia Cycle 2-day 1 Taxol /carboplatin  plus Retifanlimab  11/14/2023, Taxol  and carboplatin  dose reduced due to previous neutropenia, mild thrombocytopenia; day 8 Taxol  11/22/2023, day 15 Taxol  11/29/2023 Cycle 3-day 1 Taxol /carboplatin  plus retifanlimab  12/14/2023, day 8 Taxol  12/21/2023, day 15 Taxol  12/28/2023 CT abdomen/pelvis 01/09/2024: Decreased right anterior abdominal wall nodule, no evidence of disease progression Cycle 4-day 1 Taxol /carboplatin /retifanlimab  01/11/2024; day 8 Taxol  01/18/2024 (given peripherally), day 15 Taxol  01/25/2024 (given peripherally) Cycle 5-day 1 Taxol /carboplatin /retifanlimab  02/08/2024, day 8 Taxol  02/13/2024, day 15 Taxol  02/22/2024 Cycle 6-day 1 carboplatin /retifanlimib 03/07/2024, paclitaxel  placed on hold secondary to nail toxicity CT 03/14/2024: Stable right anterior abdominal wall nodule, no evidence of progressive metastatic disease, stable small nodes in the upper retroperitoneum and mediastinum   Remote history of an anal fissure repair Tobacco use Moderate alcohol use Eczema Right breast mass- PET scan 03/16/2021 with focal FDG activity in the inner right breast Mammogram and right breast ultrasound 03/28/2021- negative Bilateral breast MRI 03/31/2021- 7 mm mass in the upper inner right breast correlating with the PET findings, no other abnormality, no adenopathy MRI guided biopsy of right breast mass 04/11/2021- fibrocystic change with sclerosing adenosis, small focus of inflammation/fibrosis suggestive of resolving fat necrosis, no evidence of malignancy--repeat bilateral breast MRI at a 64-month interval; bilateral breast MRI on 10/13/2021-no evidence of breast malignancy.  Annual screening mammography recommended.   7.  Admission with small bowel obstruction 12/03/2021 status post exploratory laparotomy and lysis of adhesions 8.  Right abdominal  pain/tenderness-potentially related to the right abdominal wall mass noted on CT 08/31/2023 9.  Elevated liver enzymes-chronic, etiology unclear 10.  Hematuria-evaluated  by urology 09/11/2023, cystoscopy with radiation and infection changes, no tumor seen 11.  Right neck/chest tenderness 12/21/2023-negative Doppler study.  Recurrent symptoms 01/15/2024.  Started on a course of Augmentin .  Referred for Port-A-Cath study 01/18/2024-study completed 02/01/2024, intact tunneled right internal jugular vein Port-A-Cath with catheter tip located just below the SVC/RA junction.  No evidence of catheter rupture, contrast extravasation or fibrin sheath. Port-A-Cath dye study 02/01/2024: Normal 12.  Chemotherapy nail toxicity-likely secondary to paclitaxel , paclitaxel  placed on hold 03/07/2024      Disposition: Beth [name] has metastatic anal cancer.  There is no evidence of disease progression.  She will complete another treatment with retifanlimib day. She has consistent discomfort at the right upper abdomen.  I think it is unlikely the pain is related to the small right lateral abdominal wall mass.  The pain feels similar to when she had adhesions in 2022.  I will contact Dr. Sheldon to get his opinion regarding the etiology of the pain and recommended diagnostic evaluation.  She will return for an office visit in 2 weeks.  I refilled her prescription for oxycodone .    Arley Hof, MD  04/04/2024  9:27 AM

## 2024-04-04 NOTE — Telephone Encounter (Signed)
 Patient has been scheduled for follow-up visit per 04/04/24 LOS.  Pt aware of scheduled appt details.

## 2024-04-07 ENCOUNTER — Encounter: Payer: Self-pay | Admitting: Oncology

## 2024-04-09 ENCOUNTER — Ambulatory Visit (INDEPENDENT_AMBULATORY_CARE_PROVIDER_SITE_OTHER): Admitting: Podiatrist

## 2024-04-09 ENCOUNTER — Encounter: Payer: Self-pay | Admitting: Podiatrist

## 2024-04-09 DIAGNOSIS — L03032 Cellulitis of left toe: Secondary | ICD-10-CM

## 2024-04-09 MED ORDER — MUPIROCIN 2 % EX OINT: 1.0000 | TOPICAL_OINTMENT | Freq: Two times a day (BID) | CUTANEOUS | 2 refills | Status: DC

## 2024-04-09 NOTE — Patient Instructions (Signed)
Soak Instructions    THE DAY AFTER THE PROCEDURE  Place 1/4 cup of epsom salts in a quart of warm tap water.  Submerge your foot or feet with outer bandage intact for the initial soak; this will allow the bandage to become moist and wet for easy lift off.  Once you remove your bandage, continue to soak in the solution for 20 minutes.  .  Next, remove your foot or feet from solution, blot dry the affected area and cover.  Apply mupirocin ointment (RX), or polysporin   You may use a band aid large enough to cover the area or use gauze and tape.    **This soak should be done twice a day for the next 5-7 days.- after that time you may switch to keeping it clean with soap and water in the shower.  You may discontinue use of the bandaid at night after about 2 weeks.  You may discontinue use of the bandaid during the day in shoes when there is no drainage on the bandaid.   IF YOUR SKIN BECOMES IRRITATED WHILE USING THESE INSTRUCTIONS, IT IS OKAY TO SWITCH TO  antibacterial soap pump soap (Dial)  and water to keep the toe clean instead of soaking in epsom salts.

## 2024-04-09 NOTE — Progress Notes (Signed)
 Chief Complaint  Patient presents with   Toe Pain    Hallux left - thick toenails, been painful for about 2 months, appears loose, some drainage around the sides, chemo doc Rx'd antibtiotics-no help   New Patient (Initial Visit)     HPI: Patient is 53 y.o. female who presents today for pain in the left hallux.  It is thick and has been very painful for about 2 months.  It appears loose and there is been some drainage around the nail.  She is on chemotherapy and the nail became loose on its own.  She is currently on antibiotics but states this is not helped with the nail itself.  Patient Active Problem List   Diagnosis Date Noted   Genetic testing 11/26/2023   Hematuria of unknown etiology 04/17/2023   Steatosis of liver 04/06/2023   S/P lobectomy of lung 12/29/2022   Colostomy complication (HCC) 11/07/2022   Slow transit constipation    Peristomal dermatitis    Colostomy care (HCC)    Menopausal symptom 03/22/2022   Vitamin D  deficiency 02/28/2022   Herpes simplex 02/28/2022   SBO (small bowel obstruction) s/p lysis of adhesions 12/04/2021 12/06/2021   S/P exploratory laparotomy 12/04/2021   Anal squamous cell carcinoma (HCC) 03/01/2021   Squamous cell carcinoma of anal canal (HCC) 02/25/2021   History of migraine    Malignant tumor of colon (HCC) 02/03/2021   Encounter for screening colonoscopy    Rectal polyp    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 08/30/2016 10/07/2016   Tobacco abuse 08/08/2016   Chronic anal fissure s/p partial sphincterotomy 08/30/2016 08/08/2016   Constipation, chronic 08/08/2016    Current Outpatient Medications on File Prior to Visit  Medication Sig Dispense Refill   acetaminophen  (TYLENOL ) 500 MG tablet Take 1,000 mg by mouth every 6 (six) hours as needed (for pain.).     B Complex Vitamins (VITAMIN B COMPLEX PO) Take by mouth daily. Gummies     cetirizine (ZYRTEC) 10 MG tablet Take 10 mg by mouth in the morning.      clobetasol ointment (TEMOVATE) 0.05 % Apply 1 Application topically at bedtime. Apply to palms at bedtime and cover with gloves     docusate sodium  (COLACE) 100 MG capsule Take 100 mg by mouth daily as needed for mild constipation (in the afternoon for regularity).     gabapentin  (NEURONTIN ) 300 MG capsule Take 300 mg by mouth 3 (three) times daily. Takes w/600 mg capsule to equal 900 mg     gabapentin  (NEURONTIN ) 600 MG tablet Take 900 mg by mouth in the morning, at noon, and at bedtime. Morning, afternoon & night     glipiZIDE (GLUCOTROL XL) 2.5 MG 24 hr tablet Take 2.5 mg by mouth every morning.     HUMULIN R  100 UNIT/ML injection Inject into the skin 3 (three) times daily before meals. Sliding scale: 1-4 units based on glucose     hydrOXYzine  (ATARAX ) 25 MG tablet Take 25 mg by mouth at bedtime.     ibuprofen (ADVIL) 200 MG tablet Take 400 mg by mouth every 8 (eight) hours as needed (for pain.). (Patient not taking: Reported on 10/10/2023)     lidocaine -prilocaine  (EMLA ) cream Apply 1 Application topically as needed (Apply to port area 1-2 hours, start using 2 weeks after placement). 30 g 2   ondansetron  (ZOFRAN ) 8 MG tablet Take 1 tablet (8 mg total) by mouth every 8 (eight) hours as needed (Take after 3  days after chemo as needed for nausea). 20 tablet 2   oxyCODONE  (OXY IR/ROXICODONE ) 5 MG immediate release tablet Take 0.5-1 tablets (2.5-5 mg total) by mouth every 4 (four) hours as needed for severe pain (pain score 7-10). 75 tablet 0   polyethylene glycol (MIRALAX  / GLYCOLAX ) 17 g packet Take 17 g by mouth in the morning.     prochlorperazine  (COMPAZINE ) 10 MG tablet Take 1 tablet (10 mg total) by mouth every 6 (six) hours as needed for nausea or vomiting. (Patient not taking: Reported on 04/04/2024) 30 tablet 2   SEMGLEE, YFGN, 100 UNIT/ML Pen Inject 8 Units into the skin every evening.     spironolactone  (ALDACTONE ) 100 MG tablet Take 100 mg by mouth daily in the afternoon.     triamcinolone   cream (KENALOG ) 0.5 % Apply 1 Application topically 2 (two) times daily as needed (skin irritation.). On legs     valACYclovir  (VALTREX ) 500 MG tablet Take 500 mg by mouth in the morning.     Current Facility-Administered Medications on File Prior to Visit  Medication Dose Route Frequency Provider Last Rate Last Admin   0.9 %  sodium chloride  infusion   Intravenous Continuous Sumner Ends, MD   Stopped at 12/21/23 1531   heparin  lock flush 100 unit/mL  500 Units Intracatheter Once PRN Sumner Ends, MD       sodium chloride  flush (NS) 0.9 % injection 10 mL  10 mL Intracatheter PRN Sumner Ends, MD        Allergies  Allergen Reactions   Clindamycin /Lincomycin Rash   Sulfa Drugs Cross Reactors Itching and Rash    Review of Systems No fevers, chills, nausea, muscle aches, no difficulty breathing, no calf pain, no chest pain or shortness of breath.   Physical Exam  GENERAL APPEARANCE: Alert, conversant. Appropriately groomed. No acute distress.   VASCULAR: Pedal pulses palpable 2/4 DP and 2/4 PT bilateral.  Capillary refill time is immediate to all digits,  Proximal to distal cooling is warm to warm.  Digital perfusion adequate.   NEUROLOGIC: sensation is intact to 5.07 monofilament at 5/5 sites bilateral.  Light touch is intact bilateral, vibratory sensation intact bilateral  MUSCULOSKELETAL: acceptable muscle strength, tone and stability bilateral.  No gross boney pedal deformities noted.  No pain, crepitus or limitation noted with foot and ankle range of motion bilateral.   DERMATOLOGIC: skin is warm, supple, and dry.  Left hallux nail is thick, and loose from the base of the nail.  There is some clear drainage around the nail and the nail plate itself.  Remainder of the nails are also dystrophic due to the chemotherapy.  None however are ingrown or loose like the left hallux nail.   Assessment     ICD-10-CM   1. Paronychia of great toe, left  L03.032         Plan  Discussed treatment options and recommendations.  The nail is very loose therefore I recommended removing the nail and allowing the area to heal.  She agreed to put the skin with alcohol and infiltrated 2% lidocaine  and 0.5% Marcaine  plain mixture in a one-to-one mix and a digital block fashion.  The toe was then prepped and carefully the nail was removed in toto.  The area was then cleansed well with Betadine and antibiotic ointment and a dry sterile compressive dressing was applied.  She was given instructions for soaks and aftercare.  I also discussed the nail will grow back and give  instructions for care.  If there is increased redness, swelling or drainage she will call otherwise to be seen back in the future as needed for follow-up.

## 2024-04-10 ENCOUNTER — Other Ambulatory Visit: Payer: Self-pay

## 2024-04-11 ENCOUNTER — Ambulatory Visit: Admitting: Nurse Practitioner

## 2024-04-11 ENCOUNTER — Other Ambulatory Visit

## 2024-04-11 ENCOUNTER — Ambulatory Visit

## 2024-04-18 ENCOUNTER — Encounter: Payer: Self-pay | Admitting: Nurse Practitioner

## 2024-04-18 ENCOUNTER — Ambulatory Visit (HOSPITAL_BASED_OUTPATIENT_CLINIC_OR_DEPARTMENT_OTHER)

## 2024-04-18 ENCOUNTER — Ambulatory Visit

## 2024-04-18 ENCOUNTER — Other Ambulatory Visit

## 2024-04-18 ENCOUNTER — Inpatient Hospital Stay: Attending: Oncology | Admitting: Nurse Practitioner

## 2024-04-18 ENCOUNTER — Ambulatory Visit: Admitting: Oncology

## 2024-04-18 VITALS — BP 119/94 | HR 95 | Temp 98.2°F | Resp 18 | Ht 67.0 in | Wt 173.3 lb

## 2024-04-18 DIAGNOSIS — Z79620 Long term (current) use of immunosuppressive biologic: Secondary | ICD-10-CM | POA: Diagnosis not present

## 2024-04-18 DIAGNOSIS — Z5112 Encounter for antineoplastic immunotherapy: Secondary | ICD-10-CM | POA: Diagnosis present

## 2024-04-18 DIAGNOSIS — C210 Malignant neoplasm of anus, unspecified: Secondary | ICD-10-CM | POA: Insufficient documentation

## 2024-04-18 DIAGNOSIS — R600 Localized edema: Secondary | ICD-10-CM | POA: Diagnosis not present

## 2024-04-18 MED ORDER — OXYCODONE HCL 5 MG PO TABS: 2.5000 mg | ORAL_TABLET | ORAL | 0 refills | Status: DC | PRN

## 2024-04-18 MED ORDER — PANTOPRAZOLE SODIUM 40 MG PO TBEC: 40.0000 mg | DELAYED_RELEASE_TABLET | Freq: Every day | ORAL | 2 refills | Status: DC

## 2024-04-18 NOTE — Progress Notes (Signed)
 [facility] OFFICE PROGRESS NOTE   Diagnosis: Anal cancer  INTERVAL HISTORY:   [name] returns as scheduled.  She completed a cycle of retifanlimab  04/04/2024.  No rash or diarrhea.  She continues to have discomfort involving multiple fingernails and toenails.  Left great toenail was removed by podiatry 04/09/2024.  She continues to have right upper medial abdominal pain.  She notes no improvement or worsening following oral intake.  Position does not seem to influence the pain.  She takes oxycodone  as needed.  She notes that the left foot is "puffy".  Objective:  Vital signs in last 24 hours:  Blood pressure (!) 119/94, pulse 95, temperature 98.2 F (36.8 C), temperature source Temporal, resp. rate 18, height 5\' 7"  (1.702 m), weight 173 lb 4.8 oz (78.6 kg), last menstrual period 03/13/2012, SpO2 100%.    HEENT: No thrush or ulcers. Resp: Lungs clear bilaterally. Cardio: Regular rate and rhythm. GI: No hepatosplenomegaly.  No mass.  Tender right upper medial abdomen.  Left lower quadrant colostomy. Vascular: Trace edema left lower leg extending to the foot.  Calves nontender. Skin: No rash.  Discoloration of multiple fingernails and toenails.  Left great toenail bed with signs of healing, no erythema. Port-A-Cath without erythema.  Lab Results:  Lab Results  Component Value Date   WBC 5.7 04/04/2024   HGB 13.3 04/04/2024   HCT 39.4 04/04/2024   MCV 101.5 (H) 04/04/2024   PLT 101 (L) 04/04/2024   NEUTROABS 4.3 04/04/2024    Imaging:  No results found.  Medications: I have reviewed the patient's current medications.  Assessment/Plan: Squamous cell carcinoma of the anal canal Colonoscopy 01/28/2021-rectal mass extending to the "outside", palpable on exam-biopsy revealed high-grade squamous intraepithelial lesion (AIN 3/carcinoma in situ) Exam under anesthesia with transanal excision of an anal canal mass 02/25/2021-frozen section consistent with squamous  cell carcinoma, 4 x 4 centimeter sessile anal canal mass extending to the anal verge, right lateral and posterior, 30% circumferential, fixed to the anal sphincter complex; anal rectal mass with mixed high-grade neuroendocrine squamous cell carcinoma; posterior anal tag with squamous cell carcinoma; left labial lesion-condyloma acuminata CTs 03/07/2021-ill-definition of tissue planes along the anus with abnormal soft tissue prominence posteriorly along the anal canal suspicious for mass.  Small adjacent lower perirectal lymph nodes in addition to a 0.5 cm sacral node.  Focal enhancing lesion in the right hepatic lobe 1.2 x 0.8 x 0.9 cm.  2.1 x 1.5 cm cystic lesion right ovary with thin enhancing rim. PET scan 03/16/2021-hypermetabolic mass within the anal canal.  No definite evidence of local or distant metastatic disease.  7 mm left submandibular lymph node with mild FDG uptake, likely reactive.  Focal site of FDG uptake within the inner quadrant of the right breast. Radiation 03/21/2021-04/27/2021 Cycle one 5-FU/mitomycin -C 03/21/2021 Cycle two 5-FU/Mitomycin -C 04/18/2021, 5-FU dose reduced secondary to mucositis Anoscopy by Dr. Hershell Lose 07/20/2021-divet in the right posterior lateral aspect with some right anterior and right lateral folds consistent with radiation treatment.  No ulceration.  Not fully resolved but markedly shrunk down. Anorectal mass 09/30/2021 examination under anesthesia, biopsy-poorly differentiated carcinoma-basaloid squamous carcinoma and high-grade neuroendocrine carcinoma, margins positive APR/and vertical rectus abdominis flap 11/03/2021, invasive poorly differentiated carcinoma, negative resection margins, 0/10 nodes, treatment response score 2,ypT1,ypN0 CT abdomen/pelvis 12/03/2021-high-grade small bowel obstruction transitioning in the left lower abdomen.  Presacral rim-enhancing 4.4 x 2.8 x 3.8 cm fluid collection.  No air in the collection.  Prior study described a potential abnormality  in  the right lobe of the liver, not seen on current study.  Cystic lesion of the right ovary resolved. 12/04/2021: Lysis of adhesions secondary to internal hernia CT chest 12/07/2021-interval development of bilateral trace pleural effusions. CTs 12/04/2022-new 2.6 x 2.5 cm right lower lobe nodule PET 12/21/2022-FDG avid right lower lobe nodule, no other evidence of metastatic disease 12/29/2022 right lower lobectomy, lymph node dissection-poorly differentiated carcinoma consistent with metastasis, 4.8 x 2.9 x 2.6 cm, margins free, 7 lymph nodes negative for carcinoma; cytohistomorphology correlation supports the current tumor being a metastasis from the previously diagnosed anal carcinoma CT chest 04/17/2023-interval right lower lobectomy with resection of right lung mass, pleural thickening and small right pleural effusion, no new mass or lymph node enlargement CTs 07/20/2023-diminished postoperative right pleural effusion.  Unchanged postoperative findings status post abdominoperineal resection with left lower quadrant and colostomy.  No evidence of metastatic disease. CT abdomen/pelvis 08/31/2023-heterogenous soft tissue nodule in the right upper quadrant anterior abdominal wall, no evidence of metastatic disease CT/ultrasound guided biopsy of right abdominal wall mass 09/07/2023-metastatic neoplasm consistent with a metastasis from anal cancer, Foundation 1-HRD signature negative, MSS, tumor mutation burden 4, BRCA 1 alteration, PD-L1 TPS 10% Cycle 1 day 1 Taxol //carboplatin  10/19/2023, day 8 Taxol  10/26/2023, day 15 Taxol  held 11/02/2023 due to neutropenia Cycle 2-day 1 Taxol /carboplatin  plus Retifanlimab  11/14/2023, Taxol  and carboplatin  dose reduced due to previous neutropenia, mild thrombocytopenia; day 8 Taxol  11/22/2023, day 15 Taxol  11/29/2023 Cycle 3-day 1 Taxol /carboplatin  plus retifanlimab  12/14/2023, day 8 Taxol  12/21/2023, day 15 Taxol  12/28/2023 CT abdomen/pelvis 01/09/2024: Decreased right anterior  abdominal wall nodule, no evidence of disease progression Cycle 4-day 1 Taxol /carboplatin /retifanlimab  01/11/2024; day 8 Taxol  01/18/2024 (given peripherally), day 15 Taxol  01/25/2024 (given peripherally) Cycle 5-day 1 Taxol /carboplatin /retifanlimab  02/08/2024, day 8 Taxol  02/13/2024, day 15 Taxol  02/22/2024 Cycle 6-day 1 carboplatin /retifanlimib 03/07/2024, paclitaxel  placed on hold secondary to nail toxicity CT 03/14/2024: Stable right anterior abdominal wall nodule, no evidence of progressive metastatic disease, stable small nodes in the upper retroperitoneum and mediastinum   Remote history of an anal fissure repair Tobacco use Moderate alcohol use Eczema Right breast mass- PET scan 03/16/2021 with focal FDG activity in the inner right breast Mammogram and right breast ultrasound 03/28/2021- negative Bilateral breast MRI 03/31/2021- 7 mm mass in the upper inner right breast correlating with the PET findings, no other abnormality, no adenopathy MRI guided biopsy of right breast mass 04/11/2021- fibrocystic change with sclerosing adenosis, small focus of inflammation/fibrosis suggestive of resolving fat necrosis, no evidence of malignancy--repeat bilateral breast MRI at a 80-month interval; bilateral breast MRI on 10/13/2021-no evidence of breast malignancy.  Annual screening mammography recommended.   7.  Admission with small bowel obstruction 12/03/2021 status post exploratory laparotomy and lysis of adhesions 8.  Right abdominal pain/tenderness-potentially related to the right abdominal wall mass noted on CT 08/31/2023 9.  Elevated liver enzymes-chronic, etiology unclear 10.  Hematuria-evaluated by urology 09/11/2023, cystoscopy with radiation and infection changes, no tumor seen 11.  Right neck/chest tenderness 12/21/2023-negative Doppler study.  Recurrent symptoms 01/15/2024.  Started on a course of Augmentin .  Referred for Port-A-Cath study 01/18/2024-study completed 02/01/2024, intact tunneled right internal  jugular vein Port-A-Cath with catheter tip located just below the SVC/RA junction.  No evidence of catheter rupture, contrast extravasation or fibrin sheath. Port-A-Cath dye study 02/01/2024: Normal 12.  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  Left great toenail removed by podiatry 04/09/2024.      Disposition: [name] appears stable.  She completed  a cycle of retifanlimab  04/04/2024.  She is tolerating well.  Recent CTs with no evidence of progressive disease.  She continues to have pain/tenderness at the right upper abdomen.  Etiology unclear.  She will begin Protonix  40 mg daily.  She is scheduled to see Dr. Hershell Lose 04/22/2024.  She will continue oxycodone  as needed, new prescription sent to her pharmacy today.  She has trace edema at the left lower leg.  Plan for bilateral venous Doppler studies.  She will return for follow-up as scheduled in 2 weeks.  We are available to see her sooner if needed.   Diana Forster ANP/GNP-BC   04/18/2024  8:07 AM

## 2024-04-22 ENCOUNTER — Other Ambulatory Visit: Payer: Self-pay | Admitting: *Deleted

## 2024-04-22 DIAGNOSIS — C210 Malignant neoplasm of anus, unspecified: Secondary | ICD-10-CM

## 2024-04-27 ENCOUNTER — Other Ambulatory Visit: Payer: Self-pay | Admitting: Oncology

## 2024-04-27 DIAGNOSIS — C210 Malignant neoplasm of anus, unspecified: Secondary | ICD-10-CM

## 2024-05-01 ENCOUNTER — Encounter: Payer: Self-pay | Admitting: Oncology

## 2024-05-01 ENCOUNTER — Other Ambulatory Visit: Payer: Self-pay

## 2024-05-02 ENCOUNTER — Inpatient Hospital Stay (HOSPITAL_BASED_OUTPATIENT_CLINIC_OR_DEPARTMENT_OTHER): Admitting: Oncology

## 2024-05-02 ENCOUNTER — Inpatient Hospital Stay

## 2024-05-02 ENCOUNTER — Other Ambulatory Visit: Payer: Self-pay | Admitting: Oncology

## 2024-05-02 ENCOUNTER — Encounter: Payer: Self-pay | Admitting: Oncology

## 2024-05-02 VITALS — BP 128/92 | HR 89

## 2024-05-02 VITALS — BP 122/88 | HR 89 | Temp 97.7°F | Resp 16 | Wt 174.9 lb

## 2024-05-02 DIAGNOSIS — C210 Malignant neoplasm of anus, unspecified: Secondary | ICD-10-CM

## 2024-05-02 LAB — CMP (CANCER CENTER ONLY)
ALT: 134 U/L — ABNORMAL HIGH (ref 0–44)
AST: 72 U/L — ABNORMAL HIGH (ref 15–41)
Albumin: 4.2 g/dL (ref 3.5–5.0)
Alkaline Phosphatase: 93 U/L (ref 38–126)
Anion gap: 12 (ref 5–15)
BUN: 12 mg/dL (ref 6–20)
CO2: 24 mmol/L (ref 22–32)
Calcium: 9.9 mg/dL (ref 8.9–10.3)
Chloride: 99 mmol/L (ref 98–111)
Creatinine: 0.56 mg/dL (ref 0.44–1.00)
GFR, Estimated: >60 mL/min (ref >60)
Glucose, Bld: 201 mg/dL — ABNORMAL HIGH (ref 70–99)
Potassium: 4.3 mmol/L (ref 3.5–5.1)
Sodium: 136 mmol/L (ref 135–145)
Total Bilirubin: 0.4 mg/dL (ref 0.0–1.2)
Total Protein: 6.8 g/dL (ref 6.5–8.1)

## 2024-05-02 LAB — CBC WITH DIFFERENTIAL (CANCER CENTER ONLY)
Abs Immature Granulocytes: 0.04 10*3/uL (ref 0.00–0.07)
Basophils Absolute: 0 10*3/uL (ref 0.0–0.1)
Basophils Relative: 0 %
Eosinophils Absolute: 0.2 10*3/uL (ref 0.0–0.5)
Eosinophils Relative: 3 %
HCT: 40.7 % (ref 36.0–46.0)
Hemoglobin: 13.8 g/dL (ref 12.0–15.0)
Immature Granulocytes: 1 %
Lymphocytes Relative: 10 %
Lymphs Abs: 0.5 10*3/uL — ABNORMAL LOW (ref 0.7–4.0)
MCH: 34.2 pg — ABNORMAL HIGH (ref 26.0–34.0)
MCHC: 33.9 g/dL (ref 30.0–36.0)
MCV: 100.7 fL — ABNORMAL HIGH (ref 80.0–100.0)
Monocytes Absolute: 0.8 10*3/uL (ref 0.1–1.0)
Monocytes Relative: 15 %
Neutro Abs: 4 10*3/uL (ref 1.7–7.7)
Neutrophils Relative %: 71 %
Platelet Count: 123 10*3/uL — ABNORMAL LOW (ref 150–400)
RBC: 4.04 MIL/uL (ref 3.87–5.11)
RDW: 12.6 % (ref 11.5–15.5)
WBC Count: 5.5 10*3/uL (ref 4.0–10.5)
nRBC: 0 % (ref 0.0–0.2)

## 2024-05-02 MED ORDER — HEPARIN SOD (PORK) LOCK FLUSH 100 UNIT/ML IV SOLN
500.0000 [IU] | Freq: Once | INTRAVENOUS | Status: AC | PRN
Start: 2024-05-02 — End: 2024-05-02
  Administered 2024-05-02: 500 [IU]

## 2024-05-02 MED ORDER — SODIUM CHLORIDE 0.9 % IV SOLN
INTRAVENOUS | Status: DC
Start: 2024-05-02 — End: 2024-05-02

## 2024-05-02 MED ORDER — OXYCODONE HCL 5 MG PO TABS: 2.5000 mg | ORAL_TABLET | ORAL | 0 refills | Status: DC | PRN

## 2024-05-02 MED ORDER — SODIUM CHLORIDE 0.9 % IV SOLN
500.0000 mg | Freq: Once | INTRAVENOUS | Status: AC
  Administered 2024-05-02: 500 mg via INTRAVENOUS
  Filled 2024-05-02: qty 20

## 2024-05-02 MED ORDER — SODIUM CHLORIDE 0.9% FLUSH
10.0000 mL | INTRAVENOUS | Status: DC | PRN
Start: 2024-05-02 — End: 2024-05-02
  Administered 2024-05-02: 10 mL

## 2024-05-02 NOTE — Progress Notes (Signed)
 [facility] OFFICE PROGRESS NOTE   Diagnosis: Anal cancer  INTERVAL HISTORY:   Beth [name] returns as scheduled.  Beth [name] saw Dr. Hershell Lose on 04/22/2024.  He feels her pain is related to the right abdominal wall mass.  He offered resection of the abdominal wall mass.  Beth [name] is considering whether to undergo resection of the mass. Beth [name] continues to have pain at the right upper abdomen.  Beth [name] also has pain at the nailbeds.  Beth [name] takes oxycodone  several times per day.  Beth [name] continues to have pain at the fingernails with drainage at the bilateral fourth fingernail.  The left right toenail was removed by podiatry. Beth [name] was last treated with retifanlimib on 04/04/2024.  No rash or diarrhea.    Objective:  Vital signs in last 24 hours:  Blood pressure 122/88, pulse 89, temperature 97.7 F (36.5 C), temperature source Temporal, resp. rate 16, weight 174 lb 14.4 oz (79.3 kg), last menstrual period 03/13/2012, SpO2 100%.     Lymphatics: No cervical, supraclavicular, axillary, or inguinal nodes Resp: Lungs clear bilaterally Cardio: Regular rate and rhythm GI: No hepatosplenomegaly, left lower quadrant colostomy, tender in the right upper abdomen on deep palpation, no mass Vascular: No leg edema  Skin: Discoloration at the fingernails bilaterally with moisture/drainage underneath the bilateral fourth fingernail, status post removal of the left great toenail  Portacath/PICC-without erythema  Lab Results:  Lab Results  Component Value Date   WBC 5.5 05/02/2024   HGB 13.8 05/02/2024   HCT 40.7 05/02/2024   MCV 100.7 (H) 05/02/2024   PLT 123 (L) 05/02/2024   NEUTROABS 4.0 05/02/2024    CMP  Lab Results  Component Value Date   NA 136 05/02/2024   K 4.3 05/02/2024   CL 99 05/02/2024   CO2 24 05/02/2024   GLUCOSE 201 (H) 05/02/2024   BUN 12 05/02/2024   CREATININE 0.56 05/02/2024   CALCIUM  9.9 05/02/2024   PROT 6.8 05/02/2024   ALBUMIN  4.2 05/02/2024   AST 72 (H)  05/02/2024   ALT 134 (H) 05/02/2024   ALKPHOS 93 05/02/2024   BILITOT 0.4 05/02/2024   GFRNONAA >60 05/02/2024   GFRAA 106 11/29/2020    Lab Results  Component Value Date   CEA 1.39 10/10/2023    Lab Results  Component Value Date   INR 1.1 09/07/2023   LABPROT 14.1 09/07/2023    Imaging:  No results found.  Medications: I have reviewed the patient's current medications.   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  Small adjacent lower perirectal lymph nodes in addition to a 0.5 cm sacral node.  Focal enhancing lesion in the right hepatic lobe 1.2 x 0.8 x 0.9 cm.  2.1 x 1.5 cm cystic lesion right ovary with thin enhancing rim. PET scan 03/16/2021-hypermetabolic mass within the anal canal.  No definite evidence of local or distant metastatic disease.  7  mm left submandibular lymph node with mild FDG uptake, likely reactive.  Focal site of FDG uptake within the inner quadrant of the right breast. Radiation 03/21/2021-04/27/2021 Cycle one 5-FU/mitomycin -C 03/21/2021 Cycle two 5-FU/Mitomycin -C 04/18/2021, 5-FU dose reduced secondary to mucositis Anoscopy by Dr. Hershell Lose 07/20/2021-divet in the right posterior lateral aspect  with some right anterior and right lateral folds consistent with radiation treatment.  No ulceration.  Not fully resolved but markedly shrunk down. Anorectal mass 09/30/2021 examination under anesthesia, biopsy-poorly differentiated carcinoma-basaloid squamous carcinoma and high-grade neuroendocrine carcinoma, margins positive APR/and vertical rectus abdominis flap 11/03/2021, invasive poorly differentiated carcinoma, negative resection margins, 0/10 nodes, treatment response score 2,ypT1,ypN0 CT abdomen/pelvis 12/03/2021-high-grade small bowel obstruction transitioning in the left lower abdomen.  Presacral rim-enhancing 4.4 x 2.8 x 3.8 cm fluid collection.  No air in the collection.  Prior study described a potential abnormality in the right lobe of the liver, not seen on current study.  Cystic lesion of the right ovary resolved. 12/04/2021: Lysis of adhesions secondary to internal hernia CT chest 12/07/2021-interval development of bilateral trace pleural effusions. CTs 12/04/2022-new 2.6 x 2.5 cm right lower lobe nodule PET 12/21/2022-FDG avid right lower lobe nodule, no other evidence of metastatic disease 12/29/2022 right lower lobectomy, lymph node dissection-poorly differentiated carcinoma consistent with metastasis, 4.8 x 2.9 x 2.6 cm, margins free, 7 lymph nodes negative for carcinoma; cytohistomorphology correlation supports the current tumor being a metastasis from the previously diagnosed anal carcinoma CT chest 04/17/2023-interval right lower lobectomy with resection of right lung mass, pleural thickening and small right pleural effusion, no new mass or lymph node enlargement CTs 07/20/2023-diminished postoperative right pleural effusion.  Unchanged postoperative findings status post abdominoperineal resection with left lower quadrant and colostomy.  No evidence of metastatic disease. CT abdomen/pelvis 08/31/2023-heterogenous soft tissue nodule in the right upper quadrant anterior abdominal wall, no  evidence of metastatic disease CT/ultrasound guided biopsy of right abdominal wall mass 09/07/2023-metastatic neoplasm consistent with a metastasis from anal cancer, Foundation 1-HRD signature negative, MSS, tumor mutation burden 4, BRCA 1 alteration, PD-L1 TPS 10% Cycle 1 day 1 Taxol //carboplatin  10/19/2023, day 8 Taxol  10/26/2023, day 15 Taxol  held 11/02/2023 due to neutropenia Cycle 2-day 1 Taxol /carboplatin  plus Retifanlimab  11/14/2023, Taxol  and carboplatin  dose reduced due to previous neutropenia, mild thrombocytopenia; day 8 Taxol  11/22/2023, day 15 Taxol  11/29/2023 Cycle 3-day 1 Taxol /carboplatin  plus retifanlimab  12/14/2023, day 8 Taxol  12/21/2023, day 15 Taxol  12/28/2023 CT abdomen/pelvis 01/09/2024: Decreased right anterior abdominal wall nodule, no evidence of disease progression Cycle 4-day 1 Taxol /carboplatin /retifanlimab  01/11/2024; day 8 Taxol  01/18/2024 (given peripherally), day 15 Taxol  01/25/2024 (given peripherally) Cycle 5-day 1 Taxol /carboplatin /retifanlimab  02/08/2024, day 8 Taxol  02/13/2024, day 15 Taxol  02/22/2024 Cycle 6-day 1 carboplatin /retifanlimib 03/07/2024, paclitaxel  placed on hold secondary to nail toxicity CT 03/14/2024: Stable right anterior abdominal wall nodule, no evidence of progressive metastatic disease, stable small nodes in the upper retroperitoneum and mediastinum Treatment continued with monthly single agent retifanlimib   Remote history of an anal fissure repair Tobacco use Moderate alcohol use Eczema Right breast mass- PET scan 03/16/2021 with focal FDG activity in the inner right breast Mammogram and right breast ultrasound 03/28/2021- negative Bilateral breast MRI 03/31/2021- 7 mm mass in the upper inner right breast correlating with the PET findings, no other abnormality, no adenopathy MRI guided biopsy of right breast mass 04/11/2021- fibrocystic change with sclerosing adenosis, small focus of inflammation/fibrosis suggestive of resolving fat necrosis, no evidence of  malignancy--repeat bilateral breast MRI at a 61-month interval; bilateral breast MRI on 10/13/2021-no evidence of  breast malignancy.  Annual screening mammography recommended.   7.  Admission with small bowel obstruction 12/03/2021 status post exploratory laparotomy and lysis of adhesions 8.  Right abdominal pain/tenderness-potentially related to the right abdominal wall mass noted on CT 08/31/2023 9.  Elevated liver enzymes-chronic, etiology unclear 10.  Hematuria-evaluated by urology 09/11/2023, cystoscopy with radiation and infection changes, no tumor seen 11.  Right neck/chest tenderness 12/21/2023-negative Doppler study.  Recurrent symptoms 01/15/2024.  Started on a course of Augmentin .  Referred for Port-A-Cath study 01/18/2024-study completed 02/01/2024, intact tunneled right internal jugular vein Port-A-Cath with catheter tip located just below the SVC/RA junction.  No evidence of catheter rupture, contrast extravasation or fibrin sheath. Port-A-Cath dye study 02/01/2024: Normal 12.  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  Left great toenail removed by podiatry 04/09/2024.       Disposition: Ms. Wellendorf appears unchanged.  Beth [name] continues to have right abdominal pain.  Beth [name] has persistent pain and takes oxycodone  several times per day.  The fingernails appear to be slowly improving and the toenails appear significantly improved.  There is no clinical evidence for progressive metastatic anal cancer.  Beth [name] saw Dr. Hershell Lose and he feels her pain is related to the right abdominal wall mass.  Beth [name] is considering surgical resection.  Beth [name] understands there is no guarantee resection of the mass will improve her pain.  It is also unclear whether resection of the abdominal wall mass will lead to a significant disease free survival.  The plan is to continue retifanlimib for now.  Beth [name] will complete another treatment today.  Beth [name] will return for an office visit and  retifanlimib in 4 weeks.  I refilled her prescription for oxycodone .  Beth [name] will call in the interim as needed.  Coni Deep, MD  05/02/2024  4:17 PM

## 2024-05-02 NOTE — Progress Notes (Signed)
 Patient seen by Dr. Coni Deep today  Vitals are within treatment parameters:Yes   Labs are within treatment parameters: No (Please specify and give further instructions.)  ALT 134--OK to proceed  Treatment plan has been signed: Yes   Per physician team, Patient is ready for treatment and there are NO modifications to the treatment plan.

## 2024-05-02 NOTE — Patient Instructions (Signed)
 [facility] - A DEPT OF [facility]    Discharge Instructions: Thank you for choosing [facility] to provide your oncology and hematology care.   If you have a lab appointment with the Cancer Center, please go directly to the Cancer Center and check in at the registration area.   Wear comfortable clothing and clothing appropriate for easy access to any Portacath or PICC line.   We strive to give you quality time with your provider. You may need to reschedule your appointment if you arrive late (15 or more minutes).  Arriving late affects you and other patients whose appointments are after yours.  Also, if you miss three or more appointments without notifying the office, you may be dismissed from the clinic at the provider's discretion.      For prescription refill requests, have your pharmacy contact our office and allow 72 hours for refills to be completed.    Today you received the following chemotherapy and/or immunotherapy agents Zynyz       To help prevent nausea and vomiting after your treatment, we encourage you to take your nausea medication as directed.  BELOW ARE SYMPTOMS THAT SHOULD BE REPORTED IMMEDIATELY: *FEVER GREATER THAN 100.4 F (38 C) OR HIGHER *CHILLS OR SWEATING *NAUSEA AND VOMITING THAT IS NOT CONTROLLED WITH YOUR NAUSEA MEDICATION *UNUSUAL SHORTNESS OF BREATH *UNUSUAL BRUISING OR BLEEDING *URINARY PROBLEMS (pain or burning when urinating, or frequent urination) *BOWEL PROBLEMS (unusual diarrhea, constipation, pain near the anus) TENDERNESS IN MOUTH AND THROAT WITH OR WITHOUT PRESENCE OF ULCERS (sore throat, sores in mouth, or a toothache) UNUSUAL RASH, SWELLING OR PAIN  UNUSUAL VAGINAL DISCHARGE OR ITCHING   Items with * indicate a potential emergency and should be followed up as soon as possible or go to the Emergency Department if any problems should occur.  Please show the CHEMOTHERAPY ALERT CARD or IMMUNOTHERAPY  ALERT CARD at check-in to the Emergency Department and triage nurse.  Should you have questions after your visit or need to cancel or reschedule your appointment, please contact [facility] - A DEPT OF [facility]  Dept: [phone]  and follow the prompts.  Office hours are 8:00 a.m. to 4:30 p.m. Monday - Friday. Please note that voicemails left after 4:00 p.m. may not be returned until the following business day.  We are closed weekends and major holidays. You have access to a nurse at all times for urgent questions. Please call the main number to the clinic Dept: [phone] and follow the prompts.   For any non-urgent questions, you may also contact your provider using MyChart. We now offer e-Visits for anyone 49 and older to request care online for non-urgent symptoms. For details visit [email or web address].   Also download the MyChart app! Go to the app store, search "MyChart", open the app, select [facility], and log in with your MyChart username and password.  Retifanlimab  Injection What is this medication? RETIFANLIMAB  (RE ti FAN li mab) treats skin cancer. It works by helping your immune system slow or stop the spread of cancer cells. It is a monoclonal antibody. This medicine may be used for other purposes; ask your health care provider or pharmacist if you have questions. COMMON BRAND NAME(S): ZYNYZ  What should I tell my care team before I take this medication? They need to know if you have any of these conditions: Allogeneic stem cell transplant (uses someone else's stem cells) Autoimmune diseases, such as  Crohn's disease, ulcerative colitis, lupus History of chest radiation Nervous system problems, such as Guillain-Barre syndrome or myasthenia gravis Organ transplant An unusual or allergic reaction to retifanlimab , other medications, foods, dyes, or preservatives Pregnant or trying to get pregnant Breast-feeding How should I use this  medication? This medication is infused into a vein. It is given by your care team in a hospital or clinic setting. A special MedGuide will be given to you before each treatment. Be sure to read this information carefully each time. Talk to your care team about the use of this medication in children. Special care may be needed. Overdosage: If you think you have taken too much of this medicine contact a poison control center or emergency room at once. NOTE: This medicine is only for you. Do not share this medicine with others. What if I miss a dose? Keep appointments for follow-up doses. It is important not to miss your dose. Call your care team if you are unable to keep an appointment. What may interact with this medication? Interactions have not been studied. This list may not describe all possible interactions. Give your health care provider a list of all the medicines, herbs, non-prescription drugs, or dietary supplements you use. Also tell them if you smoke, drink alcohol, or use illegal drugs. Some items may interact with your medicine. What should I watch for while using this medication? Visit your care team for regular checks on your progress. It may be some time before you see the benefit from this medication. You may need blood work while taking this medication. This medication may cause serious skin reactions. They can happen weeks to months after starting the medication. Contact your care team right away if you notice fevers or flu-like symptoms with a rash. The rash may be red or purple and then turn into blisters or peeling of the skin. You may also notice a red rash with swelling of the face, lips, or lymph nodes in your neck or under your arms. Tell your care team right away if you have any change in your eyesight. Talk to your care team if you wish to become pregnant or think you might be pregnant. A negative pregnancy test is required before starting this medication. A reliable form  of contraception is recommended while taking this medication and for 4 months after stopping therapy. Talk to your care team about effective forms of contraception. Do not breast-feed while taking this medication and for 4 months after stopping therapy. What side effects may I notice from receiving this medication? Side effects that you should report to your care team as soon as possible: Allergic reactions--skin rash, itching, hives, swelling of the face, lips, tongue, or throat Dry cough, shortness of breath or trouble breathing Eye pain, redness, irritation, or discharge with blurry or decreased vision Heart muscle inflammation--unusual weakness or fatigue, shortness of breath, chest pain, fast or irregular heartbeat, dizziness, swelling of the ankles, feet, or hands Hormone gland problems--headache, sensitivity to light, unusual weakness or fatigue, dizziness, fast or irregular heartbeat, increased sensitivity to cold or heat, excessive sweating, constipation, hair loss, increased thirst or amount of urine, tremors or shaking, irritability Infusion reactions--chest pain, shortness of breath or trouble breathing, feeling faint or lightheaded Kidney injury (glomerulonephritis)--decrease in the amount of urine, red or dark brown urine, foamy or bubbly urine, swelling of the ankles, hands, or feet Liver injury--right upper belly pain, loss of appetite, nausea, light-colored stool, dark yellow or brown urine, yellowing skin or eyes,  unusual weakness or fatigue Pain, tingling, or numbness in the hands or feet, muscle weakness, change in vision, confusion or trouble speaking, loss of balance or coordination, trouble walking, seizures Rash, fever, and swollen lymph nodes Redness, blistering, peeling, or loosening of the skin, including inside the mouth Sudden or severe stomach pain, bloody diarrhea, fever, nausea, vomiting Side effects that usually do not require medical attention (report these to your  care team if they continue or are bothersome): Bone, joint, or muscle pain Diarrhea Fatigue Loss of appetite Nausea Skin rash This list may not describe all possible side effects. Call your doctor for medical advice about side effects. You may report side effects to FDA at 1-800-FDA-1088. Where should I keep my medication? This medication is given in a hospital or clinic. It will not be stored at home. NOTE: This sheet is a summary. It may not cover all possible information. If you have questions about this medicine, talk to your doctor, pharmacist, or health care provider.  2024 Elsevier/Gold Standard (2022-03-28 00:00:00)

## 2024-05-03 ENCOUNTER — Other Ambulatory Visit: Payer: Self-pay

## 2024-05-05 ENCOUNTER — Telehealth: Payer: Self-pay

## 2024-05-05 ENCOUNTER — Telehealth: Payer: Self-pay | Admitting: Nurse Practitioner

## 2024-05-05 ENCOUNTER — Other Ambulatory Visit: Payer: Self-pay

## 2024-05-05 DIAGNOSIS — C210 Malignant neoplasm of anus, unspecified: Secondary | ICD-10-CM

## 2024-05-05 NOTE — Telephone Encounter (Signed)
 The patient contacted the office to inform us  that she has decided to proceed with the surgery. She has requested a CT scan prior to scheduling the procedure, as she would like to determine the size of the tumor beforehand.

## 2024-05-05 NOTE — Telephone Encounter (Signed)
 Patient has been scheduled for follow-up visit per 05/05/24 LOS.  Pt aware of scheduled appt details.

## 2024-05-06 ENCOUNTER — Other Ambulatory Visit: Payer: Self-pay

## 2024-05-09 ENCOUNTER — Ambulatory Visit
Admission: RE | Admit: 2024-05-09 | Discharge: 2024-05-09 | Disposition: A | Discharge status: Home / Self Care | Source: Ambulatory Visit | Attending: Oncology | Admitting: Oncology

## 2024-05-09 DIAGNOSIS — C210 Malignant neoplasm of anus, unspecified: Secondary | ICD-10-CM

## 2024-05-09 MED ORDER — SODIUM CHLORIDE 0.9% FLUSH: 10.0000 mL | INTRAVENOUS | Status: DC | PRN

## 2024-05-09 MED ORDER — IOPAMIDOL (ISOVUE-300) INJECTION 61%
100.0000 mL | Freq: Once | INTRAVENOUS | Status: AC | PRN
  Administered 2024-05-09: 100 mL via INTRAVENOUS

## 2024-05-09 MED ORDER — HEPARIN SOD (PORK) LOCK FLUSH 100 UNIT/ML IV SOLN
500.0000 [IU] | Freq: Once | INTRAVENOUS | Status: AC
  Administered 2024-05-09: 500 [IU] via INTRAVENOUS

## 2024-05-11 ENCOUNTER — Ambulatory Visit: Payer: Self-pay | Admitting: Oncology

## 2024-05-13 ENCOUNTER — Ambulatory Visit: Payer: Self-pay | Admitting: Surgery

## 2024-05-13 ENCOUNTER — Encounter: Payer: Self-pay | Admitting: Oncology

## 2024-05-13 NOTE — Telephone Encounter (Signed)
 Voiced understanding. Patient is planning on having surgery: Resection of mass . No dates/ times have been scheduled yet. Made aware to update [facility], agreed. Sent results from 05/09/24 CT Abdomen pelvis w/ contrast to Candyce Champagne MD consulting physician per Dr. Remus Carmine request.

## 2024-05-13 NOTE — Telephone Encounter (Signed)
-----   Message from Coni Deep sent at 05/11/2024  9:07 PM EDT ----- Please call patient, the abdominal wall mass is slightly larger, copy to Dr Hershell Lose f/u as scheduled

## 2024-05-16 ENCOUNTER — Other Ambulatory Visit: Payer: Self-pay

## 2024-05-28 ENCOUNTER — Telehealth: Payer: Self-pay

## 2024-05-28 ENCOUNTER — Other Ambulatory Visit: Payer: Self-pay | Admitting: Nurse Practitioner

## 2024-05-28 DIAGNOSIS — C210 Malignant neoplasm of anus, unspecified: Secondary | ICD-10-CM

## 2024-05-28 MED ORDER — OXYCODONE HCL 5 MG PO TABS: 2.5000 mg | ORAL_TABLET | ORAL | 0 refills | Status: DC | PRN

## 2024-05-28 NOTE — Telephone Encounter (Signed)
 Patient called and request a refill of her Oxycodone,I placed the request on the Np's desk.

## 2024-05-29 ENCOUNTER — Inpatient Hospital Stay

## 2024-05-29 ENCOUNTER — Encounter: Payer: Self-pay | Admitting: Nurse Practitioner

## 2024-05-29 ENCOUNTER — Inpatient Hospital Stay: Attending: Oncology | Admitting: Nurse Practitioner

## 2024-05-29 VITALS — BP 142/92 | HR 99 | Temp 98.2°F | Resp 18 | Ht 67.0 in | Wt 174.0 lb

## 2024-05-29 DIAGNOSIS — C210 Malignant neoplasm of anus, unspecified: Secondary | ICD-10-CM | POA: Diagnosis not present

## 2024-05-29 DIAGNOSIS — C211 Malignant neoplasm of anal canal: Secondary | ICD-10-CM | POA: Insufficient documentation

## 2024-05-29 DIAGNOSIS — L309 Dermatitis, unspecified: Secondary | ICD-10-CM | POA: Diagnosis not present

## 2024-05-29 DIAGNOSIS — F1090 Alcohol use, unspecified, uncomplicated: Secondary | ICD-10-CM | POA: Diagnosis not present

## 2024-05-29 DIAGNOSIS — Z720 Tobacco use: Secondary | ICD-10-CM | POA: Diagnosis not present

## 2024-05-29 LAB — CMP (CANCER CENTER ONLY)
ALT: 177 U/L — ABNORMAL HIGH (ref 0–44)
AST: 104 U/L — ABNORMAL HIGH (ref 15–41)
Albumin: 4.6 g/dL (ref 3.5–5.0)
Alkaline Phosphatase: 101 U/L (ref 38–126)
Anion gap: 15 (ref 5–15)
BUN: 11 mg/dL (ref 6–20)
CO2: 23 mmol/L (ref 22–32)
Calcium: 9.7 mg/dL (ref 8.9–10.3)
Chloride: 98 mmol/L (ref 98–111)
Creatinine: 0.62 mg/dL (ref 0.44–1.00)
GFR, Estimated: >60 mL/min (ref >60)
Glucose, Bld: 256 mg/dL — ABNORMAL HIGH (ref 70–99)
Potassium: 4 mmol/L (ref 3.5–5.1)
Sodium: 135 mmol/L (ref 135–145)
Total Bilirubin: 0.4 mg/dL (ref 0.0–1.2)
Total Protein: 7.3 g/dL (ref 6.5–8.1)

## 2024-05-29 LAB — CBC WITH DIFFERENTIAL (CANCER CENTER ONLY)
Abs Immature Granulocytes: 0.02 10*3/uL (ref 0.00–0.07)
Basophils Absolute: 0 10*3/uL (ref 0.0–0.1)
Basophils Relative: 0 %
Eosinophils Absolute: 0.3 10*3/uL (ref 0.0–0.5)
Eosinophils Relative: 4 %
HCT: 39.4 % (ref 36.0–46.0)
Hemoglobin: 13.5 g/dL (ref 12.0–15.0)
Immature Granulocytes: 0 %
Lymphocytes Relative: 9 %
Lymphs Abs: 0.7 10*3/uL (ref 0.7–4.0)
MCH: 34.1 pg — ABNORMAL HIGH (ref 26.0–34.0)
MCHC: 34.3 g/dL (ref 30.0–36.0)
MCV: 99.5 fL (ref 80.0–100.0)
Monocytes Absolute: 0.7 10*3/uL (ref 0.1–1.0)
Monocytes Relative: 9 %
Neutro Abs: 5.5 10*3/uL (ref 1.7–7.7)
Neutrophils Relative %: 78 %
Platelet Count: 123 10*3/uL — ABNORMAL LOW (ref 150–400)
RBC: 3.96 MIL/uL (ref 3.87–5.11)
RDW: 12.4 % (ref 11.5–15.5)
WBC Count: 7.1 10*3/uL (ref 4.0–10.5)
nRBC: 0 % (ref 0.0–0.2)

## 2024-05-29 MED ORDER — SODIUM CHLORIDE 0.9% FLUSH
10.0000 mL | INTRAVENOUS | Status: AC | PRN
  Administered 2024-05-29: 10 mL via INTRAVENOUS

## 2024-05-29 MED ORDER — HEPARIN SOD (PORK) LOCK FLUSH 100 UNIT/ML IV SOLN
500.0000 [IU] | Freq: Once | INTRAVENOUS | Status: AC
  Administered 2024-05-29: 500 [IU] via INTRAVENOUS

## 2024-05-29 NOTE — Progress Notes (Signed)
 Patient seen by Diana Forster NP today  Vitals are within treatment parameters:Yes  BP- 142/92 Labs are within treatment parameters: No (Please specify and give further instructions.)  ALT- 177, AST-104 Treatment plan has been signed: Yes   Per physician team, Patient will not be receiving treatment today.

## 2024-05-29 NOTE — Patient Instructions (Addendum)
 SURGICAL WAITING ROOM VISITATION  Patients having surgery or a procedure may have no more than 2 support people in the waiting area - these visitors may rotate.    Children under the age of 42 must have an adult with them who is not the patient.  Visitors with respiratory illnesses are discouraged from visiting and should remain at home.  If the patient needs to stay at the hospital during part of their recovery, the visitor guidelines for inpatient rooms apply. Pre-op nurse will coordinate an appropriate time for 1 support person to accompany patient in pre-op.  This support person may not rotate.    Please refer to the [facility] website for the visitor guidelines for Inpatients (after your surgery is over and you are in a regular room).    Your procedure is scheduled on: 06/05/24   Report to [facility] Main Entrance    Report to admitting at 8:15 AM   Call this number if you have problems the morning of surgery [phone]   Do not eat food :After Midnight.   After Midnight you may have the following liquids until 7:30  AM DAY OF SURGERY  Water  Non-Citrus Juices (without pulp, NO RED-Apple, White grape, White cranberry) Black Coffee (NO MILK/CREAM OR CREAMERS, sugar ok)  Clear Tea (NO MILK/CREAM OR CREAMERS, sugar ok) regular and decaf                             Plain Jell-O (NO RED)                                           Fruit ices (not with fruit pulp, NO RED)                                     Popsicles (NO RED)                                                               Sports drinks like Gatorade (NO RED)                  The day of surgery:  Drink ONE (1) Pre-Surgery G2 at 7:30 AM the morning of surgery. Drink in one sitting. Do not sip.  This drink was given to you during your hospital  pre-op appointment visit. Nothing else to drink after completing the  Pre-Surgery G2.          If you have questions, please contact your surgeon's  office.   FOLLOW BOWEL PREP AND ANY ADDITIONAL PRE OP INSTRUCTIONS YOU RECEIVED FROM YOUR SURGEON'S OFFICE!!!     Oral Hygiene is also important to reduce your risk of infection.                                    Remember - BRUSH YOUR TEETH THE MORNING OF SURGERY WITH YOUR REGULAR TOOTHPASTE  DENTURES WILL BE REMOVED PRIOR TO SURGERY PLEASE DO NOT APPLY Poly grip OR  ADHESIVES!!!   Stop all vitamins and herbal supplements 7 days before surgery.   Take these medicines the morning of surgery with A SIP OF WATER : Tylenol , Zyrtec, Flonase, Gabapentin , Zofran , Oxycodone , Pantoprazole    DO NOT TAKE ANY ORAL DIABETIC MEDICATIONS DAY OF YOUR SURGERY  How to Manage Your Diabetes Before and After Surgery  Why is it important to control my blood sugar before and after surgery? Improving blood sugar levels before and after surgery helps healing and can limit problems. A way of improving blood sugar control is eating a healthy diet by:  Eating less sugar and carbohydrates  Increasing activity/exercise  Talking with your doctor about reaching your blood sugar goals High blood sugars (greater than 180 mg/dL) can raise your risk of infections and slow your recovery, so you will need to focus on controlling your diabetes during the weeks before surgery. Make sure that the doctor who takes care of your diabetes knows about your planned surgery including the date and location.  How do I manage my blood sugar before surgery? Check your blood sugar at least 4 times a day, starting 2 days before surgery, to make sure that the level is not too high or low. Check your blood sugar the morning of your surgery when you wake up and every 2 hours until you get to the Short Stay unit. If your blood sugar is less than 70 mg/dL, you will need to treat for low blood sugar: Do not take insulin . Treat a low blood sugar (less than 70 mg/dL) with  cup of clear juice (cranberry or apple), 4 glucose tablets, OR  glucose gel. Recheck blood sugar in 15 minutes after treatment (to make sure it is greater than 70 mg/dL). If your blood sugar is not greater than 70 mg/dL on recheck, call [phone] for further instructions. Report your blood sugar to the short stay nurse when you get to Short Stay.  If you are admitted to the hospital after surgery: Your blood sugar will be checked by the staff and you will probably be given insulin  after surgery (instead of oral diabetes medicines) to make sure you have good blood sugar levels. The goal for blood sugar control after surgery is 80-180 mg/dL.   WHAT DO I DO ABOUT MY DIABETES MEDICATION?  Do not take oral diabetes medicines (pills) the morning of surgery.  THE DAY BEFORE SURGERY, take only morning dose of Glipizide, no afternoon or evening dose. Take Humulin as prescribed. Take 50% of Semglee at bedtime.      THE MORNING OF SURGERY, do not take Glipizide or Semglee. Do not take Humulin unless blood sugar is greater than 220, then take 50% of normal dose.    Reviewed and Endorsed by [facility] Patient Education Committee, August 2015                              You may not have any metal on your body including hair pins, jewelry, and body piercing             Do not wear make-up, lotions, powders, perfumes, or deodorant  Do not wear nail polish including gel and S&S, artificial/acrylic nails, or any other type of covering on natural nails including finger and toenails. If you have artificial nails, gel coating, etc. that needs to be removed by a nail salon please have this removed prior to surgery or surgery may need to be canceled/ delayed if  the surgeon/ anesthesia feels like they are unable to be safely monitored.   Do not shave  48 hours prior to surgery.    Do not bring valuables to the hospital. [facility] IS NOT             RESPONSIBLE   FOR VALUABLES.   Contacts, glasses, dentures or bridgework may not be worn into surgery.   Bring small  overnight bag day of surgery.   DO NOT BRING YOUR HOME MEDICATIONS TO THE HOSPITAL. PHARMACY WILL DISPENSE MEDICATIONS LISTED ON YOUR MEDICATION LIST TO YOU DURING YOUR ADMISSION IN THE HOSPITAL!              Please read over the following fact sheets you were given: IF YOU HAVE QUESTIONS ABOUT YOUR PRE-OP INSTRUCTIONS PLEASE CALL [phone] [name]    If you received a COVID test during your pre-op visit  it is requested that you wear a mask when out in public, stay away from anyone that may not be feeling well and notify your surgeon if you develop symptoms. If you test positive for Covid or have been in contact with anyone that has tested positive in the last 10 days please notify you surgeon.    [facility] - Preparing for Surgery Before surgery, you can play an important role.  Because skin is not sterile, your skin needs to be as free of germs as possible.  You can reduce the number of germs on your skin by washing with CHG (chlorahexidine gluconate) soap before surgery.  CHG is an antiseptic cleaner which kills germs and bonds with the skin to continue killing germs even after washing. Please DO NOT use if you have an allergy to CHG or antibacterial soaps.  If your skin becomes reddened/irritated stop using the CHG and inform your nurse when you arrive at Short Stay. Do not shave (including legs and underarms) for at least 48 hours prior to the first CHG shower.  You may shave your face/neck.  Please follow these instructions carefully:  1.  Shower with CHG Soap the night before surgery and the  morning of surgery.  2.  If you choose to wash your hair, wash your hair first as usual with your normal  shampoo.  3.  After you shampoo, rinse your hair and body thoroughly to remove the shampoo.                             4.  Use CHG as you would any other liquid soap.  You can apply chg directly to the skin and wash.  Gently with a scrungie or clean washcloth.  5.  Apply the CHG Soap to your  body ONLY FROM THE NECK DOWN.   Do   not use on face/ open                           Wound or open sores. Avoid contact with eyes, ears mouth and   genitals (private parts).                       Wash face,  Genitals (private parts) with your normal soap.             6.  Wash thoroughly, paying special attention to the area where your    surgery  will be performed.  7.  Thoroughly rinse your body  with warm water  from the neck down.  8.  DO NOT shower/wash with your normal soap after using and rinsing off the CHG Soap.                9.  Pat yourself dry with a clean towel.            10.  Wear clean pajamas.            11.  Place clean sheets on your bed the night of your first shower and do not  sleep with pets. Day of Surgery : Do not apply any lotions/deodorants the morning of surgery.  Please wear clean clothes to the hospital/surgery center.  FAILURE TO FOLLOW THESE INSTRUCTIONS MAY RESULT IN THE CANCELLATION OF YOUR SURGERY  PATIENT SIGNATURE_________________________________  NURSE SIGNATURE__________________________________  ________________________________________________________________________

## 2024-05-29 NOTE — Progress Notes (Signed)
 [facility] OFFICE PROGRESS NOTE   Diagnosis: Anal cancer  INTERVAL HISTORY:   Beth [name] returns as scheduled.  She completed another cycle of retifanlimab  05/02/2024.  She is scheduled for excision of the abdominal wall mass 06/05/2024.  She continues to have pain associated with the mass.  She takes oxycodone  as needed.  No rash or diarrhea.  No mouth sores.  Episode of nausea this morning.  Nails with slow improvement.  Objective:  Vital signs in last 24 hours:  Blood pressure (!) 142/92, pulse 99, temperature 98.2 F (36.8 C), temperature source Temporal, resp. rate 18, height 5' 7 (1.702 m), weight 174 lb (78.9 kg), last menstrual period 03/13/2012, SpO2 100%.    HEENT: No thrush or ulcers. Resp: Lungs clear bilaterally. Cardio: Regular rate and rhythm. GI: No hepatosplenomegaly.  Left lower quadrant colostomy.  Firm fullness right upper abdomen with associated tenderness. Vascular: No leg edema. Skin: Fingernails with discoloration bilaterally, appear improved. Port-A-Cath without erythema.  Lab Results:  Lab Results  Component Value Date   WBC 7.1 05/29/2024   HGB 13.5 05/29/2024   HCT 39.4 05/29/2024   MCV 99.5 05/29/2024   PLT 123 (L) 05/29/2024   NEUTROABS 5.5 05/29/2024    Imaging:  No results found.  Medications: I have reviewed the patient's current medications.  Assessment/Plan: Squamous cell carcinoma of the anal canal Colonoscopy 01/28/2021-rectal mass extending to the outside, palpable on exam-biopsy revealed high-grade squamous intraepithelial lesion (AIN 3/carcinoma in situ) Exam under anesthesia with transanal excision of an anal canal mass 02/25/2021-frozen section consistent with squamous cell carcinoma, 4 x 4 centimeter sessile anal canal mass extending to the anal verge, right lateral and posterior, 30% circumferential, fixed to the anal sphincter complex; anal rectal mass with mixed high-grade neuroendocrine squamous cell  carcinoma; posterior anal tag with squamous cell carcinoma; left labial lesion-condyloma acuminata CTs 03/07/2021-ill-definition of tissue planes along the anus with abnormal soft tissue prominence posteriorly along the anal canal suspicious for mass.  Small adjacent lower perirectal lymph nodes in addition to a 0.5 cm sacral node.  Focal enhancing lesion in the right hepatic lobe 1.2 x 0.8 x 0.9 cm.  2.1 x 1.5 cm cystic lesion right ovary with thin enhancing rim. PET scan 03/16/2021-hypermetabolic mass within the anal canal.  No definite evidence of local or distant metastatic disease.  7 mm left submandibular lymph node with mild FDG uptake, likely reactive.  Focal site of FDG uptake within the inner quadrant of the right breast. Radiation 03/21/2021-04/27/2021 Cycle one 5-FU/mitomycin -C 03/21/2021 Cycle two 5-FU/Mitomycin -C 04/18/2021, 5-FU dose reduced secondary to mucositis Anoscopy by Dr. Hershell Lose 07/20/2021-divet in the right posterior lateral aspect with some right anterior and right lateral folds consistent with radiation treatment.  No ulceration.  Not fully resolved but markedly shrunk down. Anorectal mass 09/30/2021 examination under anesthesia, biopsy-poorly differentiated carcinoma-basaloid squamous carcinoma and high-grade neuroendocrine carcinoma, margins positive APR/and vertical rectus abdominis flap 11/03/2021, invasive poorly differentiated carcinoma, negative resection margins, 0/10 nodes, treatment response score 2,ypT1,ypN0 CT abdomen/pelvis 12/03/2021-high-grade small bowel obstruction transitioning in the left lower abdomen.  Presacral rim-enhancing 4.4 x 2.8 x 3.8 cm fluid collection.  No air in the collection.  Prior study described a potential abnormality in the right lobe of the liver, not seen on current study.  Cystic lesion of the right ovary resolved. 12/04/2021: Lysis of adhesions secondary to internal hernia CT chest 12/07/2021-interval development of bilateral trace pleural  effusions. CTs 12/04/2022-new 2.6 x 2.5 cm right lower lobe nodule PET  12/21/2022-FDG avid right lower lobe nodule, no other evidence of metastatic disease 12/29/2022 right lower lobectomy, lymph node dissection-poorly differentiated carcinoma consistent with metastasis, 4.8 x 2.9 x 2.6 cm, margins free, 7 lymph nodes negative for carcinoma; cytohistomorphology correlation supports the current tumor being a metastasis from the previously diagnosed anal carcinoma CT chest 04/17/2023-interval right lower lobectomy with resection of right lung mass, pleural thickening and small right pleural effusion, no new mass or lymph node enlargement CTs 07/20/2023-diminished postoperative right pleural effusion.  Unchanged postoperative findings status post abdominoperineal resection with left lower quadrant and colostomy.  No evidence of metastatic disease. CT abdomen/pelvis 08/31/2023-heterogenous soft tissue nodule in the right upper quadrant anterior abdominal wall, no evidence of metastatic disease CT/ultrasound guided biopsy of right abdominal wall mass 09/07/2023-metastatic neoplasm consistent with a metastasis from anal cancer, Foundation 1-HRD signature negative, MSS, tumor mutation burden 4, BRCA 1 alteration, PD-L1 TPS 10% Cycle 1 day 1 Taxol //carboplatin  10/19/2023, day 8 Taxol  10/26/2023, day 15 Taxol  held 11/02/2023 due to neutropenia Cycle 2-day 1 Taxol /carboplatin  plus Retifanlimab  11/14/2023, Taxol  and carboplatin  dose reduced due to previous neutropenia, mild thrombocytopenia; day 8 Taxol  11/22/2023, day 15 Taxol  11/29/2023 Cycle 3-day 1 Taxol /carboplatin  plus retifanlimab  12/14/2023, day 8 Taxol  12/21/2023, day 15 Taxol  12/28/2023 CT abdomen/pelvis 01/09/2024: Decreased right anterior abdominal wall nodule, no evidence of disease progression Cycle 4-day 1 Taxol /carboplatin /retifanlimab  01/11/2024; day 8 Taxol  01/18/2024 (given peripherally), day 15 Taxol  01/25/2024 (given peripherally) Cycle 5-day 1  Taxol /carboplatin /retifanlimab  02/08/2024, day 8 Taxol  02/13/2024, day 15 Taxol  02/22/2024 Cycle 6-day 1 carboplatin /retifanlimib 03/07/2024, paclitaxel  placed on hold secondary to nail toxicity CT 03/14/2024: Stable right anterior abdominal wall nodule, no evidence of progressive metastatic disease, stable small nodes in the upper retroperitoneum and mediastinum Treatment continued with monthly single agent retifanlimib CT abdomen/pelvis 05/09/2024-interval growth of soft tissue mass right anterior abdominal wall. Treatment held 05/29/2024 Excision of right abdominal wall mass scheduled 06/05/2024   Remote history of an anal fissure repair Tobacco use Moderate alcohol use Eczema Right breast mass- PET scan 03/16/2021 with focal FDG activity in the inner right breast Mammogram and right breast ultrasound 03/28/2021- negative Bilateral breast MRI 03/31/2021- 7 mm mass in the upper inner right breast correlating with the PET findings, no other abnormality, no adenopathy MRI guided biopsy of right breast mass 04/11/2021- fibrocystic change with sclerosing adenosis, small focus of inflammation/fibrosis suggestive of resolving fat necrosis, no evidence of malignancy--repeat bilateral breast MRI at a 51-month interval; bilateral breast MRI on 10/13/2021-no evidence of breast malignancy.  Annual screening mammography recommended.   7.  Admission with small bowel obstruction 12/03/2021 status post exploratory laparotomy and lysis of adhesions 8.  Right abdominal pain/tenderness-potentially related to the right abdominal wall mass noted on CT 08/31/2023 9.  Elevated liver enzymes-chronic, etiology unclear 10.  Hematuria-evaluated by urology 09/11/2023, cystoscopy with radiation and infection changes, no tumor seen 11.  Right neck/chest tenderness 12/21/2023-negative Doppler study.  Recurrent symptoms 01/15/2024.  Started on a course of Augmentin .  Referred for Port-A-Cath study 01/18/2024-study completed 02/01/2024, intact  tunneled right internal jugular vein Port-A-Cath with catheter tip located just below the SVC/RA junction.  No evidence of catheter rupture, contrast extravasation or fibrin sheath. Port-A-Cath dye study 02/01/2024: Normal 12.  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  Left great toenail removed by podiatry 04/09/2024.    Disposition: Ms. Farfan appears stable.  She is scheduled for excision of the right abdominal wall mass next week.  Dr. Enedina Harrow recommendation is to place current  treatment with retifanlimab  on hold.  She will return for follow-up 2 to 3 weeks following surgery.   Diana Forster ANP/GNP-BC   05/29/2024  2:38 PM

## 2024-05-29 NOTE — Progress Notes (Addendum)
 COVID Vaccine Completed: yes  Date of COVID positive in last 90 days:  PCP - Patra Bonnet, MD Cardiologist - n/a Oncology- Coni Deep, MD  Chest x-ray - CT- 03/14/24 Epic EKG - 09/04/23 Epic Stress Test - yes long time ago per pt, normal per pt ECHO - n/a Cardiac Cath - n/a Pacemaker/ICD device last checked: n/a Spinal Cord Stimulator: n/a  Bowel Prep - no  Sleep Study - n/a CPAP -   Fasting Blood Sugar - 140-190 Checks Blood Sugar  dexcom CGM  Last dose of GLP1 agonist-  N/A GLP1 instructions:  Hold 7 days before surgery    Last dose of SGLT-2 inhibitors-  N/A SGLT-2 instructions:  Hold 3 days before surgery    Blood Thinner Instructions:  Last dose: n/a  Time: Aspirin Instructions: Last Dose:  Activity level: Can go up a flight of stairs and perform activities of daily living without stopping and without symptoms of chest pain or shortness of breath.   Anesthesia review: BP 141/107, 134/100, and 138/100 at PAT appointment. Patient denies hx of HTN or symptoms today. Instructed to monitor over weekend and if still elevated she needs to call her PCP on Monday.   Patient denies shortness of breath, fever, cough and chest pain at PAT appointment  Patient verbalized understanding of instructions that were given to them at the PAT appointment. Patient was also instructed that they will need to review over the PAT instructions again at home before surgery.

## 2024-05-29 NOTE — Addendum Note (Signed)
 Addended by: Rasheeda Mulvehill S on: 05/29/2024 02:49 PM   Modules accepted: Orders

## 2024-05-30 ENCOUNTER — Encounter (HOSPITAL_COMMUNITY): Payer: Self-pay

## 2024-05-30 ENCOUNTER — Encounter (HOSPITAL_COMMUNITY)
Admission: RE | Admit: 2024-05-30 | Discharge: 2024-05-30 | Disposition: A | Discharge status: Home / Self Care | Source: Ambulatory Visit | Attending: Surgery | Admitting: Surgery

## 2024-05-30 ENCOUNTER — Other Ambulatory Visit: Payer: Self-pay

## 2024-05-30 VITALS — BP 134/100 | HR 101 | Temp 98.7°F | Resp 16 | Ht 67.0 in | Wt 173.9 lb

## 2024-05-30 DIAGNOSIS — Z87891 Personal history of nicotine dependence: Secondary | ICD-10-CM | POA: Insufficient documentation

## 2024-05-30 DIAGNOSIS — Z01812 Encounter for preprocedural laboratory examination: Secondary | ICD-10-CM | POA: Diagnosis present

## 2024-05-30 DIAGNOSIS — R1011 Right upper quadrant pain: Secondary | ICD-10-CM | POA: Insufficient documentation

## 2024-05-30 DIAGNOSIS — Z7984 Long term (current) use of oral hypoglycemic drugs: Secondary | ICD-10-CM | POA: Insufficient documentation

## 2024-05-30 DIAGNOSIS — Z85048 Personal history of other malignant neoplasm of rectum, rectosigmoid junction, and anus: Secondary | ICD-10-CM | POA: Insufficient documentation

## 2024-05-30 DIAGNOSIS — E119 Type 2 diabetes mellitus without complications: Secondary | ICD-10-CM | POA: Diagnosis not present

## 2024-05-30 DIAGNOSIS — R222 Localized swelling, mass and lump, trunk: Secondary | ICD-10-CM | POA: Diagnosis not present

## 2024-05-30 DIAGNOSIS — Z794 Long term (current) use of insulin: Secondary | ICD-10-CM | POA: Diagnosis not present

## 2024-05-30 HISTORY — DX: Pure hypercholesterolemia, unspecified: E78.00

## 2024-05-30 LAB — BASIC METABOLIC PANEL WITH GFR
Anion gap: 12 (ref 5–15)
BUN: 14 mg/dL (ref 6–20)
CO2: 26 mmol/L (ref 22–32)
Calcium: 10 mg/dL (ref 8.9–10.3)
Chloride: 98 mmol/L (ref 98–111)
Creatinine, Ser: 0.63 mg/dL (ref 0.44–1.00)
GFR, Estimated: >60 mL/min (ref >60)
Glucose, Bld: 151 mg/dL — ABNORMAL HIGH (ref 70–99)
Potassium: 4.3 mmol/L (ref 3.5–5.1)
Sodium: 136 mmol/L (ref 135–145)

## 2024-05-30 LAB — CBC
HCT: 45 % (ref 36.0–46.0)
Hemoglobin: 14.8 g/dL (ref 12.0–15.0)
MCH: 33.9 pg (ref 26.0–34.0)
MCHC: 32.9 g/dL (ref 30.0–36.0)
MCV: 103 fL — ABNORMAL HIGH (ref 80.0–100.0)
Platelets: 142 10*3/uL — ABNORMAL LOW (ref 150–400)
RBC: 4.37 MIL/uL (ref 3.87–5.11)
RDW: 12.3 % (ref 11.5–15.5)
WBC: 6.9 10*3/uL (ref 4.0–10.5)
nRBC: 0 % (ref 0.0–0.2)

## 2024-05-30 LAB — HEMOGLOBIN A1C
Hgb A1c MFr Bld: 7.7 % — ABNORMAL HIGH (ref 4.8–5.6)
Mean Plasma Glucose: 174.29 mg/dL

## 2024-05-30 LAB — GLUCOSE, CAPILLARY: Glucose-Capillary: 164 mg/dL — ABNORMAL HIGH (ref 70–99)

## 2024-06-02 NOTE — Progress Notes (Signed)
 Anesthesia Chart Review   Case: [id number] Date/Time: 06/05/24 1015   Procedures:      EXCISION, NEOPLASM, ABDOMINAL WALL - REMOVAL OF ABDOMINAL WALL METASTASIS, POSSIBLE MESH HERNIA REPAIR/ABD WALL RECONSTRUCTION DIAGNOSTIC LAPAROSCOPIC     LAPAROSCOPY, DIAGNOSTIC   Anesthesia type: General   Diagnosis:      Anal squamous cell carcinoma (HCC) [C21.0]     Abdominal wall mass [R22.2]     RUQ pain [R10.11]   Pre-op diagnosis: ABDOMINAL WALL METASTASIS HISTORY OF ANAL CANCER   Location: [facility] ROOM 02 / [facility]   Surgeons: Candyce Champagne, MD       DISCUSSION:52 y.o. former smoker with h/o DM II (A1C 7.7), steatoic liver disease elevated LFTs which is chronic, anal cancer, abdominal wall metastasis scheduled for above procedure 06/05/2024 with Dr. Candyce Champagne.   Pt takes 2.5-5 mg oxycodone  q8 hours as needed.   H/o anal cancer, completed a cycle of retifanlimab  05/02/2024. Treatment held 05/29/24 in preparation for upcoming procedure. Per last oncology note 05/29/24 pt appears stable.  She will return to oncology office 2-3 weeks following surgery.   BP elevated at PAT visit.  Pt without diagnosis of HTN.  Advised by PAT nurse to monitor and reach out to PCP if pressure remain high.  BP at last PCP visit 130/88. Evaluate DOS.   BP Readings from Last 3 Encounters:  05/30/24 (!) 134/100  05/29/24 (!) 142/92  05/02/24 (!) 128/92    VS: BP (!) 134/100   Pulse (!) 101   Temp 37.1 C (Oral)   Resp 16   Ht 5' 7 (1.702 m)   Wt 78.9 kg   LMP 03/13/2012   SpO2 97%   BMI 27.24 kg/m   PROVIDERS: Leesa Pulling, MD   LABS: Labs reviewed: Acceptable for surgery. (all labs ordered are listed, but only abnormal results are displayed)  Labs Reviewed  HEMOGLOBIN A1C - Abnormal; Notable for the following components:      Result Value   Hgb A1c MFr Bld 7.7 (*)    All other components within normal limits  BASIC METABOLIC PANEL WITH GFR - Abnormal; Notable for the following components:    Glucose, Bld 151 (*)    All other components within normal limits  CBC - Abnormal; Notable for the following components:   MCV 103.0 (*)    Platelets 142 (*)    All other components within normal limits  GLUCOSE, CAPILLARY - Abnormal; Notable for the following components:   Glucose-Capillary 164 (*)    All other components within normal limits     IMAGES:   EKG:   CV:  Past Medical History:  Diagnosis Date   Acne    on face spirolactone for   Anal cancer (HCC) 02/25/2021   Constipation, chronic 08/08/2016   Diabetes mellitus without complication (HCC) 08/2022   Type II   Family history of adverse reaction to anesthesia    Mother has trouble waking takes longer   Herpes simplex    History of migraine    none in years   Hypercholesteremia    Wears glasses     Past Surgical History:  Procedure Laterality Date   ABDOMINAL HYSTERECTOMY  2016   partial   COLONOSCOPY     COLONOSCOPY WITH PROPOFOL  N/A 01/28/2021   Procedure: COLONOSCOPY WITH PROPOFOL ;  Surgeon: Irby Mannan, MD;  Location: [facility];  Service: Endoscopy;  Laterality: N/A;   EXCISION HYDRADENITIS LABIA Left 02/25/2021   Procedure: EXCISION of  LABIAL  LESIOM;  Surgeon: Candyce Champagne, MD;  Location: [facility];  Service: General;  Laterality: Left;   INTERCOSTAL NERVE BLOCK  12/29/2022   Procedure: INTERCOSTAL NERVE BLOCK;  Surgeon: Zelphia Higashi, MD;  Location: [facility] OR;  Service: Thoracic;;   IR CV LINE INJECTION  02/01/2024   IR FLUORO GUIDE CV LINE RIGHT  04/18/2021   IR IMAGING GUIDED PORT INSERTION  10/18/2023   IR PATIENT EVAL TECH 0-60 MINS  01/16/2024   IR PATIENT EVAL TECH 0-60 MINS  01/25/2024   LAPAROTOMY N/A 12/03/2021   Procedure: EXPLORATORY LAPAROTOMY, LYSIS OF ADHESIONS;  Surgeon: Shela Derby, MD;  Location: [facility] OR;  Service: General;  Laterality: N/A;   LYMPH NODE DISSECTION Right 12/29/2022   Procedure: LYMPH NODE DISSECTION;  Surgeon: Zelphia Higashi,  MD;  Location: [facility] OR;  Service: Thoracic;  Laterality: Right;   OSTOMY N/A 11/03/2021   Procedure: END COLOSTOMY;  Surgeon: Candyce Champagne, MD;  Location: [facility];  Service: General;  Laterality: N/A;   PECTORALIS FLAP N/A 11/03/2021   Procedure: VERTICAL RECTUS ABDOMINIS MYOCUTANEOUS TO PERINEUM;  Surgeon: Alger Infield, MD;  Location: [facility];  Service: Plastics;  Laterality: N/A;   RECTAL BIOPSY N/A 09/30/2021   Procedure: ANORECTAL MASS BIOPSY;  Surgeon: Candyce Champagne, MD;  Location: [facility];  Service: General;  Laterality: N/A;   RECTAL EXAM UNDER ANESTHESIA N/A 02/25/2021   Procedure: ANORECTAL EXAM UNDER ANESTHESIA;  Surgeon: Candyce Champagne, MD;  Location: [facility];  Service: General;  Laterality: N/A;   RECTAL EXAM UNDER ANESTHESIA N/A 09/30/2021   Procedure: ANORECTAL EXAMINATION UNDER ANESTHESIA;  Surgeon: Candyce Champagne, MD;  Location: [facility];  Service: General;  Laterality: N/A;  GEN & LOCAL ANESTHESIA   SPHINCTEROTOMY N/A 08/30/2016   Procedure: LATERAL INTERNAL AND SPHINCTEROTOMY;  Surgeon: Candyce Champagne, MD;  Location: [facility];  Service: General;  Laterality: N/A;   TONSILLECTOMY  as child   TRANSANAL EXCISION OF RECTAL MASS N/A 02/25/2021   Procedure: TRANSANAL EXCISIONAL  BIOPSY OF ANAL CANAL MASS;  Surgeon: Candyce Champagne, MD;  Location: [facility];  Service: General;  Laterality: N/A;   XI ROBOT ABDOMINAL PERINEAL RESECTION N/A 11/03/2021   Procedure: XI ROBOT ABDOMINOPERINEAL RESECTION;  Surgeon: Candyce Champagne, MD;  Location: [facility];  Service: General;  Laterality: N/A;    MEDICATIONS:  acetaminophen  (TYLENOL ) 500 MG tablet   B Complex Vitamins (VITAMIN B COMPLEX PO)   cetirizine (ZYRTEC) 10 MG tablet   clobetasol ointment (TEMOVATE) 0.05 %   docusate sodium  (COLACE) 100 MG capsule   fluticasone (FLONASE) 50 MCG/ACT nasal spray   gabapentin  (NEURONTIN ) 300 MG capsule   gabapentin  (NEURONTIN ) 600 MG tablet   glipiZIDE (GLUCOTROL XL) 2.5 MG 24  hr tablet   HUMULIN R  100 UNIT/ML injection   hydrOXYzine  (ATARAX ) 25 MG tablet   ibuprofen (ADVIL) 200 MG tablet   lidocaine -prilocaine  (EMLA ) cream   ondansetron  (ZOFRAN ) 8 MG tablet   oxyCODONE  (OXY IR/ROXICODONE ) 5 MG immediate release tablet   pantoprazole  (PROTONIX ) 40 MG tablet   polyethylene glycol (MIRALAX  / GLYCOLAX ) 17 g packet   prochlorperazine  (COMPAZINE ) 10 MG tablet   rosuvastatin (CRESTOR) 10 MG tablet   SEMGLEE, YFGN, 100 UNIT/ML Pen   spironolactone  (ALDACTONE ) 100 MG tablet   triamcinolone  cream (KENALOG ) 0.5 %   valACYclovir  (VALTREX ) 500 MG tablet   No current facility-administered medications for this encounter.    0.9 %  sodium chloride  infusion   heparin  lock flush 100 unit/mL  sodium chloride  flush (NS) 0.9 % injection 10 mL   sodium chloride  flush (NS) 0.9 % injection 10 mL     Beth [name] Ward, PA-C [facility] [phone]

## 2024-06-03 NOTE — Anesthesia Preprocedure Evaluation (Addendum)
 Anesthesia Evaluation  Patient identified by MRN, date of birth, ID band Patient awake    Reviewed: Allergy & Precautions, H&P , NPO status , Patient's Chart, lab work & pertinent test results  History of Anesthesia Complications Negative for: history of anesthetic complications  Airway Mallampati: III  TM Distance: >3 FB Neck ROM: Full    Dental no notable dental hx. (+) Teeth Intact, Dental Advisory Given   Pulmonary former smoker Former smoker, quit 2022- 8 pack year history    Pulmonary exam normal breath sounds clear to auscultation       Cardiovascular hypertension (148/99 preop, no home meds), Normal cardiovascular exam Rhythm:Regular Rate:Normal     Neuro/Psych negative neurological ROS  negative psych ROS   GI/Hepatic ,,,(+)       alcohol useHx anal ca   Endo/Other  diabetes, Well Controlled, Type 2, Oral Hypoglycemic Agents, Insulin  Dependent    Renal/GU negative Renal ROS  negative genitourinary   Musculoskeletal negative musculoskeletal ROS (+)    Abdominal  (+) + obese  Peds negative pediatric ROS (+)  Hematology negative hematology ROS (+) Hb 14.8, plt 142   Anesthesia Other Findings Chronic pain: oxy 5mg  4-5x/d, last took last night.   Reproductive/Obstetrics negative OB ROS                             Anesthesia Physical Anesthesia Plan  ASA: 3  Anesthesia Plan: General   Post-op Pain Management: Tylenol  PO (pre-op)*, Ketamine  IV* and Dilaudid  IV   Induction: Intravenous  PONV Risk Score and Plan: 3 and Ondansetron , Dexamethasone , Midazolam  and Treatment may vary due to age or medical condition  Airway Management Planned: Oral ETT  Additional Equipment: None  Intra-op Plan:   Post-operative Plan: Extubation in OR  Informed Consent: I have reviewed the patients History and Physical, chart, labs and discussed the procedure including the risks, benefits and  alternatives for the proposed anesthesia with the patient or authorized representative who has indicated his/her understanding and acceptance.     Dental advisory given  Plan Discussed with: CRNA  Anesthesia Plan Comments:        Anesthesia Quick Evaluation

## 2024-06-05 ENCOUNTER — Ambulatory Visit (HOSPITAL_COMMUNITY): Payer: Self-pay | Admitting: Anesthesiology

## 2024-06-05 ENCOUNTER — Other Ambulatory Visit: Payer: Self-pay

## 2024-06-05 ENCOUNTER — Encounter (HOSPITAL_COMMUNITY)
Admission: RE | Disposition: A | Discharge status: Home / Self Care | Payer: Self-pay | Source: Ambulatory Visit | Attending: Surgery

## 2024-06-05 ENCOUNTER — Encounter (HOSPITAL_COMMUNITY): Payer: Self-pay | Admitting: Surgery

## 2024-06-05 ENCOUNTER — Ambulatory Visit (HOSPITAL_COMMUNITY): Payer: Self-pay | Admitting: Physician Assistant

## 2024-06-05 ENCOUNTER — Observation Stay (HOSPITAL_COMMUNITY)
Admission: RE | Admit: 2024-06-05 | Discharge: 2024-06-06 | Disposition: A | Discharge status: Home / Self Care | Source: Ambulatory Visit | Attending: Surgery | Admitting: Surgery

## 2024-06-05 DIAGNOSIS — R7309 Other abnormal glucose: Secondary | ICD-10-CM | POA: Diagnosis not present

## 2024-06-05 DIAGNOSIS — E119 Type 2 diabetes mellitus without complications: Secondary | ICD-10-CM

## 2024-06-05 DIAGNOSIS — C7989 Secondary malignant neoplasm of other specified sites: Principal | ICD-10-CM | POA: Insufficient documentation

## 2024-06-05 DIAGNOSIS — C210 Malignant neoplasm of anus, unspecified: Secondary | ICD-10-CM | POA: Diagnosis not present

## 2024-06-05 DIAGNOSIS — Z794 Long term (current) use of insulin: Secondary | ICD-10-CM

## 2024-06-05 HISTORY — PX: LAPAROSCOPY: SHX197

## 2024-06-05 HISTORY — PX: EXCISION OF ABDOMINAL WALL TUMOR: SHX6687

## 2024-06-05 LAB — GLUCOSE, CAPILLARY
Glucose-Capillary: 150 mg/dL — ABNORMAL HIGH (ref 70–99)
Glucose-Capillary: 191 mg/dL — ABNORMAL HIGH (ref 70–99)
Glucose-Capillary: 188 mg/dL — ABNORMAL HIGH (ref 70–99)
Glucose-Capillary: 243 mg/dL — ABNORMAL HIGH (ref 70–99)
Glucose-Capillary: 222 mg/dL — ABNORMAL HIGH (ref 70–99)

## 2024-06-05 SURGERY — EXCISION, NEOPLASM, ABDOMINAL WALL
Anesthesia: General

## 2024-06-05 MED ORDER — MIDAZOLAM HCL 2 MG/2ML IJ SOLN: INTRAMUSCULAR | Status: DC | PRN

## 2024-06-05 MED ORDER — CHLORHEXIDINE GLUCONATE CLOTH 2 % EX PADS: 6.0000 | MEDICATED_PAD | Freq: Once | CUTANEOUS | Status: DC

## 2024-06-05 MED ORDER — METOPROLOL TARTRATE 5 MG/5ML IV SOLN: 5.0000 mg | Freq: Four times a day (QID) | INTRAVENOUS | Status: DC | PRN

## 2024-06-05 MED ORDER — CELECOXIB 200 MG PO CAPS
200.0000 mg | ORAL_CAPSULE | ORAL | Status: AC
  Administered 2024-06-05: 200 mg via ORAL
  Filled 2024-06-05: qty 1

## 2024-06-05 MED ORDER — GABAPENTIN 300 MG PO CAPS
300.0000 mg | ORAL_CAPSULE | ORAL | Status: DC
  Filled 2024-06-05: qty 1

## 2024-06-05 MED ORDER — DEXAMETHASONE SODIUM PHOSPHATE 10 MG/ML IJ SOLN: INTRAMUSCULAR | Status: DC | PRN

## 2024-06-05 MED ORDER — FLUTICASONE PROPIONATE 50 MCG/ACT NA SUSP
1.0000 | Freq: Every day | NASAL | Status: DC | PRN
  Filled 2024-06-05: qty 16

## 2024-06-05 MED ORDER — FENTANYL CITRATE (PF) 250 MCG/5ML IJ SOLN
INTRAMUSCULAR | Status: DC | PRN
  Administered 2024-06-05: 50 ug via INTRAVENOUS

## 2024-06-05 MED ORDER — ROCURONIUM BROMIDE 10 MG/ML (PF) SYRINGE
PREFILLED_SYRINGE | INTRAVENOUS | Status: DC | PRN
Start: 2024-06-05 — End: 2024-06-05

## 2024-06-05 MED ORDER — ONDANSETRON HCL 4 MG/2ML IJ SOLN
INTRAMUSCULAR | Status: AC
  Filled 2024-06-05: qty 2

## 2024-06-05 MED ORDER — ACETAMINOPHEN 500 MG PO TABS
1000.0000 mg | ORAL_TABLET | ORAL | Status: AC
  Administered 2024-06-05: 1000 mg via ORAL
  Filled 2024-06-05: qty 2

## 2024-06-05 MED ORDER — BUPIVACAINE-EPINEPHRINE (PF) 0.25% -1:200000 IJ SOLN
INTRAMUSCULAR | Status: AC
  Filled 2024-06-05: qty 30

## 2024-06-05 MED ORDER — ONDANSETRON HCL 4 MG PO TABS: 8.0000 mg | ORAL_TABLET | Freq: Three times a day (TID) | ORAL | Status: DC | PRN

## 2024-06-05 MED ORDER — INSULIN GLARGINE-YFGN 100 UNIT/ML ~~LOC~~ SOLN
35.0000 [IU] | Freq: Every day | SUBCUTANEOUS | Status: DC
  Administered 2024-06-05: 35 [IU] via SUBCUTANEOUS
  Filled 2024-06-05: qty 0.35

## 2024-06-05 MED ORDER — ONDANSETRON HCL 4 MG/2ML IJ SOLN: 4.0000 mg | Freq: Once | INTRAMUSCULAR | Status: DC | PRN

## 2024-06-05 MED ORDER — KETAMINE HCL 50 MG/5ML IJ SOSY
PREFILLED_SYRINGE | INTRAMUSCULAR | Status: AC
  Filled 2024-06-05: qty 5

## 2024-06-05 MED ORDER — DIPHENHYDRAMINE HCL 50 MG/ML IJ SOLN: 12.5000 mg | Freq: Four times a day (QID) | INTRAMUSCULAR | Status: DC | PRN

## 2024-06-05 MED ORDER — LIDOCAINE HCL (CARDIAC) PF 100 MG/5ML IV SOSY
PREFILLED_SYRINGE | INTRAVENOUS | Status: DC | PRN
Start: 2024-06-05 — End: 2024-06-05

## 2024-06-05 MED ORDER — PROPOFOL 10 MG/ML IV BOLUS
INTRAVENOUS | Status: AC
  Filled 2024-06-05: qty 20

## 2024-06-05 MED ORDER — INSULIN ASPART 100 UNIT/ML IJ SOLN
0.0000 [IU] | Freq: Three times a day (TID) | INTRAMUSCULAR | Status: DC
  Administered 2024-06-05: 5 [IU] via SUBCUTANEOUS
  Administered 2024-06-06: 2 [IU] via SUBCUTANEOUS

## 2024-06-05 MED ORDER — 0.9 % SODIUM CHLORIDE (POUR BTL) OPTIME
TOPICAL | Status: DC | PRN
  Administered 2024-06-05: 1000 mL

## 2024-06-05 MED ORDER — SIMETHICONE 80 MG PO CHEW
40.0000 mg | CHEWABLE_TABLET | Freq: Four times a day (QID) | ORAL | Status: DC | PRN
Start: 2024-06-05 — End: 2024-06-06

## 2024-06-05 MED ORDER — VALACYCLOVIR HCL 500 MG PO TABS
500.0000 mg | ORAL_TABLET | Freq: Every day | ORAL | Status: DC
  Administered 2024-06-05 – 2024-06-06 (×2): 500 mg via ORAL
  Filled 2024-06-05 (×2): qty 1

## 2024-06-05 MED ORDER — MAGIC MOUTHWASH
15.0000 mL | Freq: Four times a day (QID) | ORAL | Status: DC | PRN
  Filled 2024-06-05: qty 15

## 2024-06-05 MED ORDER — ORAL CARE MOUTH RINSE
15.0000 mL | Freq: Once | OROMUCOSAL | Status: AC
Start: 2024-06-05 — End: 2024-06-05
  Administered 2024-06-05: 15 mL via OROMUCOSAL

## 2024-06-05 MED ORDER — DOCUSATE SODIUM 100 MG PO CAPS
200.0000 mg | ORAL_CAPSULE | Freq: Every day | ORAL | Status: DC
  Administered 2024-06-05 – 2024-06-06 (×2): 200 mg via ORAL
  Filled 2024-06-05 (×2): qty 2

## 2024-06-05 MED ORDER — OXYCODONE HCL 5 MG PO TABS: 10.0000 mg | ORAL_TABLET | Freq: Once | ORAL | Status: DC | PRN

## 2024-06-05 MED ORDER — ROSUVASTATIN CALCIUM 10 MG PO TABS
10.0000 mg | ORAL_TABLET | ORAL | Status: DC
  Administered 2024-06-06: 10 mg via ORAL
  Filled 2024-06-05: qty 1

## 2024-06-05 MED ORDER — ROCURONIUM BROMIDE 10 MG/ML (PF) SYRINGE
PREFILLED_SYRINGE | INTRAVENOUS | Status: AC
Start: 2024-06-05 — End: 2024-06-05
  Filled 2024-06-05: qty 10

## 2024-06-05 MED ORDER — PANTOPRAZOLE SODIUM 40 MG PO TBEC
40.0000 mg | DELAYED_RELEASE_TABLET | Freq: Every day | ORAL | Status: DC
  Administered 2024-06-06: 40 mg via ORAL
  Filled 2024-06-05: qty 1

## 2024-06-05 MED ORDER — BISACODYL 10 MG RE SUPP: 10.0000 mg | Freq: Every day | RECTAL | Status: DC | PRN

## 2024-06-05 MED ORDER — GABAPENTIN 400 MG PO CAPS
900.0000 mg | ORAL_CAPSULE | Freq: Three times a day (TID) | ORAL | Status: DC
  Administered 2024-06-05 – 2024-06-06 (×3): 900 mg via ORAL
  Filled 2024-06-05 (×3): qty 1

## 2024-06-05 MED ORDER — HYDROMORPHONE HCL 1 MG/ML IJ SOLN
INTRAMUSCULAR | Status: AC
  Filled 2024-06-05: qty 1

## 2024-06-05 MED ORDER — INSULIN ASPART 100 UNIT/ML IJ SOLN
0.0000 [IU] | Freq: Every day | INTRAMUSCULAR | Status: DC
  Administered 2024-06-05: 2 [IU] via SUBCUTANEOUS

## 2024-06-05 MED ORDER — OXYCODONE HCL 5 MG/5ML PO SOLN: 5.0000 mg | Freq: Once | ORAL | Status: DC | PRN

## 2024-06-05 MED ORDER — OXYCODONE HCL 5 MG PO TABS: 5.0000 mg | ORAL_TABLET | ORAL | 0 refills | Status: DC | PRN

## 2024-06-05 MED ORDER — LACTATED RINGERS IV SOLN: INTRAVENOUS | Status: DC

## 2024-06-05 MED ORDER — METHOCARBAMOL 500 MG PO TABS: 500.0000 mg | ORAL_TABLET | Freq: Four times a day (QID) | ORAL | Status: DC | PRN

## 2024-06-05 MED ORDER — HYDROMORPHONE HCL 1 MG/ML IJ SOLN
0.5000 mg | INTRAMUSCULAR | Status: DC | PRN
  Administered 2024-06-05 (×2): 0.5 mg via INTRAVENOUS

## 2024-06-05 MED ORDER — LIDOCAINE HCL (PF) 2 % IJ SOLN
INTRAMUSCULAR | Status: AC
  Filled 2024-06-05: qty 5

## 2024-06-05 MED ORDER — HYDROXYZINE HCL 25 MG PO TABS
25.0000 mg | ORAL_TABLET | Freq: Every day | ORAL | Status: DC
  Administered 2024-06-05: 25 mg via ORAL
  Filled 2024-06-05: qty 1

## 2024-06-05 MED ORDER — FENTANYL CITRATE (PF) 250 MCG/5ML IJ SOLN
INTRAMUSCULAR | Status: AC
  Filled 2024-06-05: qty 5

## 2024-06-05 MED ORDER — ONDANSETRON HCL 4 MG/2ML IJ SOLN: INTRAMUSCULAR | Status: DC | PRN

## 2024-06-05 MED ORDER — PROPOFOL 10 MG/ML IV BOLUS: INTRAVENOUS | Status: DC | PRN

## 2024-06-05 MED ORDER — GLIPIZIDE ER 2.5 MG PO TB24
2.5000 mg | ORAL_TABLET | Freq: Every day | ORAL | Status: DC
  Administered 2024-06-06: 2.5 mg via ORAL
  Filled 2024-06-05: qty 1

## 2024-06-05 MED ORDER — INSULIN ASPART 100 UNIT/ML IJ SOLN
0.0000 [IU] | INTRAMUSCULAR | Status: AC | PRN
  Administered 2024-06-05: 2 [IU] via SUBCUTANEOUS
  Filled 2024-06-05: qty 1

## 2024-06-05 MED ORDER — CEFAZOLIN SODIUM-DEXTROSE 2-4 GM/100ML-% IV SOLN
2.0000 g | INTRAVENOUS | Status: AC
  Filled 2024-06-05: qty 100

## 2024-06-05 MED ORDER — LORATADINE 10 MG PO TABS
10.0000 mg | ORAL_TABLET | Freq: Every day | ORAL | Status: DC
  Administered 2024-06-06: 10 mg via ORAL
  Filled 2024-06-05: qty 1

## 2024-06-05 MED ORDER — PROCHLORPERAZINE MALEATE 10 MG PO TABS
10.0000 mg | ORAL_TABLET | Freq: Four times a day (QID) | ORAL | Status: DC | PRN
  Filled 2024-06-05: qty 1

## 2024-06-05 MED ORDER — SALINE SPRAY 0.65 % NA SOLN
1.0000 | Freq: Four times a day (QID) | NASAL | Status: DC | PRN
  Filled 2024-06-05: qty 44

## 2024-06-05 MED ORDER — TRAMADOL HCL 50 MG PO TABS: 50.0000 mg | ORAL_TABLET | Freq: Four times a day (QID) | ORAL | Status: DC | PRN

## 2024-06-05 MED ORDER — ACETAMINOPHEN 500 MG PO TABS: 1000.0000 mg | ORAL_TABLET | Freq: Four times a day (QID) | ORAL | Status: DC | PRN

## 2024-06-05 MED ORDER — ACETAMINOPHEN 500 MG PO TABS: 1000.0000 mg | ORAL_TABLET | Freq: Once | ORAL | Status: DC

## 2024-06-05 MED ORDER — BUPIVACAINE LIPOSOME 1.3 % IJ SUSP
INTRAMUSCULAR | Status: AC
Start: 2024-06-05 — End: 2024-06-05
  Filled 2024-06-05: qty 20

## 2024-06-05 MED ORDER — PHENOL 1.4 % MT LIQD: 2.0000 | OROMUCOSAL | Status: DC | PRN

## 2024-06-05 MED ORDER — ONDANSETRON 4 MG PO TBDP: 4.0000 mg | ORAL_TABLET | Freq: Four times a day (QID) | ORAL | Status: DC | PRN

## 2024-06-05 MED ORDER — HYDROMORPHONE HCL 1 MG/ML IJ SOLN: INTRAMUSCULAR | Status: DC | PRN

## 2024-06-05 MED ORDER — MIDAZOLAM HCL 2 MG/2ML IJ SOLN
INTRAMUSCULAR | Status: AC
  Filled 2024-06-05: qty 2

## 2024-06-05 MED ORDER — ACETAMINOPHEN 500 MG PO TABS
1000.0000 mg | ORAL_TABLET | Freq: Four times a day (QID) | ORAL | Status: DC
  Administered 2024-06-05 – 2024-06-06 (×3): 1000 mg via ORAL
  Filled 2024-06-05 (×3): qty 2

## 2024-06-05 MED ORDER — HYDROMORPHONE HCL 2 MG/ML IJ SOLN
INTRAMUSCULAR | Status: AC
  Filled 2024-06-05: qty 1

## 2024-06-05 MED ORDER — LACTATED RINGERS IV SOLN: Freq: Three times a day (TID) | INTRAVENOUS | Status: DC | PRN

## 2024-06-05 MED ORDER — CHLORHEXIDINE GLUCONATE 0.12 % MT SOLN: 15.0000 mL | Freq: Once | OROMUCOSAL | Status: AC

## 2024-06-05 MED ORDER — SODIUM CHLORIDE (PF) 0.9 % IJ SOLN
INTRAMUSCULAR | Status: AC
Start: 2024-06-05 — End: 2024-06-05
  Filled 2024-06-05: qty 20

## 2024-06-05 MED ORDER — KCL IN DEXTROSE-NACL 20-5-0.45 MEQ/L-%-% IV SOLN
INTRAVENOUS | Status: DC
  Filled 2024-06-05: qty 1000

## 2024-06-05 MED ORDER — LIDOCAINE-PRILOCAINE 2.5-2.5 % EX CREA
1.0000 | TOPICAL_CREAM | CUTANEOUS | Status: DC | PRN
  Filled 2024-06-05: qty 5

## 2024-06-05 MED ORDER — SODIUM CHLORIDE (PF) 0.9 % IJ SOLN
INTRAMUSCULAR | Status: AC
Start: 2024-06-05 — End: 2024-06-05
  Filled 2024-06-05: qty 10

## 2024-06-05 MED ORDER — KETAMINE HCL 50 MG/5ML IJ SOSY: PREFILLED_SYRINGE | INTRAMUSCULAR | Status: DC | PRN

## 2024-06-05 MED ORDER — SPIRONOLACTONE 100 MG PO TABS
100.0000 mg | ORAL_TABLET | Freq: Every day | ORAL | Status: DC
  Administered 2024-06-05: 100 mg via ORAL
  Filled 2024-06-05: qty 1

## 2024-06-05 MED ORDER — ONDANSETRON HCL 4 MG/2ML IJ SOLN: 4.0000 mg | Freq: Four times a day (QID) | INTRAMUSCULAR | Status: DC | PRN

## 2024-06-05 MED ORDER — DEXAMETHASONE SODIUM PHOSPHATE 10 MG/ML IJ SOLN
INTRAMUSCULAR | Status: AC
Start: 2024-06-05 — End: 2024-06-05
  Filled 2024-06-05: qty 1

## 2024-06-05 MED ORDER — HYDROMORPHONE HCL 1 MG/ML IJ SOLN: 0.5000 mg | INTRAMUSCULAR | Status: DC | PRN

## 2024-06-05 MED ORDER — MAGNESIUM HYDROXIDE 400 MG/5ML PO SUSP: 30.0000 mL | Freq: Every day | ORAL | Status: DC | PRN

## 2024-06-05 MED ORDER — AMISULPRIDE (ANTIEMETIC) 5 MG/2ML IV SOLN: 10.0000 mg | Freq: Once | INTRAVENOUS | Status: DC | PRN

## 2024-06-05 MED ORDER — BUPIVACAINE-EPINEPHRINE 0.25% -1:200000 IJ SOLN
INTRAMUSCULAR | Status: DC | PRN
  Administered 2024-06-05: 80 mL

## 2024-06-05 MED ORDER — IBUPROFEN 400 MG PO TABS: 400.0000 mg | ORAL_TABLET | Freq: Three times a day (TID) | ORAL | Status: DC | PRN

## 2024-06-05 MED ORDER — DIPHENHYDRAMINE HCL 12.5 MG/5ML PO ELIX
12.5000 mg | ORAL_SOLUTION | Freq: Four times a day (QID) | ORAL | Status: DC | PRN
Start: 2024-06-05 — End: 2024-06-06

## 2024-06-05 MED ORDER — OXYCODONE HCL 5 MG PO TABS
5.0000 mg | ORAL_TABLET | ORAL | Status: DC | PRN
  Administered 2024-06-05 – 2024-06-06 (×3): 10 mg via ORAL
  Filled 2024-06-05 (×3): qty 2

## 2024-06-05 MED ORDER — ENOXAPARIN SODIUM 40 MG/0.4ML IJ SOSY
40.0000 mg | PREFILLED_SYRINGE | INTRAMUSCULAR | Status: DC
Start: 2024-06-06 — End: 2024-06-06
  Administered 2024-06-06: 40 mg via SUBCUTANEOUS
  Filled 2024-06-05: qty 0.4

## 2024-06-05 MED ORDER — SUGAMMADEX SODIUM 200 MG/2ML IV SOLN: INTRAVENOUS | Status: DC | PRN

## 2024-06-05 MED ORDER — CEFAZOLIN SODIUM-DEXTROSE 2-4 GM/100ML-% IV SOLN
2.0000 g | Freq: Three times a day (TID) | INTRAVENOUS | Status: AC
  Administered 2024-06-05: 2 g via INTRAVENOUS
  Filled 2024-06-05: qty 100

## 2024-06-05 MED ORDER — STERILE WATER FOR IRRIGATION IR SOLN
Status: DC | PRN
  Administered 2024-06-05: 1000 mL

## 2024-06-05 MED ORDER — MENTHOL 3 MG MT LOZG: 1.0000 | LOZENGE | OROMUCOSAL | Status: DC | PRN

## 2024-06-05 MED ORDER — NAPHAZOLINE-GLYCERIN 0.012-0.25 % OP SOLN
1.0000 [drp] | Freq: Four times a day (QID) | OPHTHALMIC | Status: DC | PRN
  Filled 2024-06-05: qty 15

## 2024-06-05 MED ORDER — POLYETHYLENE GLYCOL 3350 17 G PO PACK
17.0000 g | PACK | Freq: Every day | ORAL | Status: DC
  Administered 2024-06-06: 17 g via ORAL
  Filled 2024-06-05: qty 1

## 2024-06-05 SURGICAL SUPPLY — 45 items
BAG COUNTER SPONGE SURGICOUNT (BAG) IMPLANT
BENZOIN TINCTURE PRP APPL 2/3 (GAUZE/BANDAGES/DRESSINGS) IMPLANT
CABLE HIGH FREQUENCY MONO STRZ (ELECTRODE) ×2 IMPLANT
COVER SURGICAL LIGHT HANDLE (MISCELLANEOUS) ×2 IMPLANT
DEVICE SECURE STRAP 25 ABSORB (INSTRUMENTS) IMPLANT
DEVICE TROCAR PUNCTURE CLOSURE (ENDOMECHANICALS) IMPLANT
DRAPE INCISE IOBAN 66X45 STRL (DRAPES) IMPLANT
DRAPE UTILITY XL STRL (DRAPES) ×2 IMPLANT
DRAPE WARM FLUID 44X44 (DRAPES) ×2 IMPLANT
DRSG TEGADERM 2-3/8X2-3/4 SM (GAUZE/BANDAGES/DRESSINGS) IMPLANT
DRSG TEGADERM 4X4.75 (GAUZE/BANDAGES/DRESSINGS) IMPLANT
ELECT REM PT RETURN 15FT ADLT (MISCELLANEOUS) ×2 IMPLANT
GAUZE SPONGE 2X2 8PLY STRL LF (GAUZE/BANDAGES/DRESSINGS) ×2 IMPLANT
GLOVE ECLIPSE 8.0 STRL XLNG CF (GLOVE) ×2 IMPLANT
GLOVE INDICATOR 8.0 STRL GRN (GLOVE) ×2 IMPLANT
GOWN STRL REUS W/ TWL XL LVL3 (GOWN DISPOSABLE) ×2 IMPLANT
IRRIGATION SUCT STRKRFLW 2 WTP (MISCELLANEOUS) ×2 IMPLANT
KIT BASIN OR (CUSTOM PROCEDURE TRAY) ×2 IMPLANT
KIT TURNOVER KIT A (KITS) ×4 IMPLANT
MESH VENTRALIGHT ST 7X9N (Mesh General) IMPLANT
NDL INSUFFLATION 14GA 120MM (NEEDLE) IMPLANT
NEEDLE INSUFFLATION 14GA 120MM (NEEDLE) ×1 IMPLANT
PAD POSITIONING PINK XL (MISCELLANEOUS) ×2 IMPLANT
POUCH RETRIEVAL ECOSAC 10 (ENDOMECHANICALS) IMPLANT
PROTECTOR NERVE ULNAR (MISCELLANEOUS) IMPLANT
RETRACTOR WND ALEXIS 25 LRG (MISCELLANEOUS) IMPLANT
SCISSORS LAP 5X35 DISP (ENDOMECHANICALS) ×2 IMPLANT
SEALER TISSUE G2 STRG ARTC 35C (ENDOMECHANICALS) IMPLANT
SET TUBE SMOKE EVAC HIGH FLOW (TUBING) ×2 IMPLANT
SLEEVE ADV FIXATION 5X100MM (TROCAR) ×4 IMPLANT
SLEEVE Z-THREAD 5X100MM (TROCAR) IMPLANT
SPIKE FLUID TRANSFER (MISCELLANEOUS) ×2 IMPLANT
SUT MNCRL AB 4-0 PS2 18 (SUTURE) ×2 IMPLANT
SUT PDS AB 1 CT1 27 (SUTURE) IMPLANT
SUT SILK 2 0 SH (SUTURE) IMPLANT
SUT SILK 2 0 SH CR/8 (SUTURE) ×2 IMPLANT
SUT SILK 2-0 18XBRD TIE 12 (SUTURE) ×2 IMPLANT
SUT STRATA 2-0 15 CT-1.5 (SUTURE) ×4 IMPLANT
TOWEL OR 17X26 10 PK STRL BLUE (TOWEL DISPOSABLE) ×2 IMPLANT
TRAY FOLEY MTR SLVR 16FR STAT (SET/KITS/TRAYS/PACK) IMPLANT
TRAY LAPAROSCOPIC (CUSTOM PROCEDURE TRAY) ×2 IMPLANT
TROCAR 11X100 Z THREAD (TROCAR) IMPLANT
TROCAR ADV FIXATION 12X100MM (TROCAR) IMPLANT
TROCAR ADV FIXATION 5X100MM (TROCAR) ×2 IMPLANT
TROCAR Z-THREAD OPTICAL 5X100M (TROCAR) ×2 IMPLANT

## 2024-06-05 NOTE — Op Note (Signed)
 06/05/2024  PATIENT:  Beth [name]  53 y.o. female  Patient Care Team: Leesa Pulling, MD as PCP - General (Unknown Physician Specialty) Candyce Champagne, MD as Consulting Physician (General Surgery) Celedonio Coil, MD as Consulting Physician (Gastroenterology) Darl Edu, MD as Referring Physician (Obstetrics and Gynecology) Irby Mannan, MD (Inactive) as Consulting Physician (Gastroenterology) Sumner Ends, MD as Consulting Physician (Oncology) Rosslyn Coons, RN as Oncology Nurse Navigator O'Kelley, Jordis Nevins, RN as Oncology Nurse Navigator  PRE-OPERATIVE DIAGNOSIS:   ABDOMINAL WALL METASTASIS HISTORY OF ANAL CANCER   POST-OPERATIVE DIAGNOSIS:   ABDOMINAL WALL METASTASIS HISTORY OF ANAL CANCER, possibly x2 locations  PROCEDURE:   LAPAROSCOPIC LYSIS OF ADHESIONS EXCISION OF ABDOMINAL WALL METASTASIS EXCISION OF ABDOMINAL WALL MASS ABDOMINAL WALL RECONSTRUCTION WITH MESH TRANSVERSUS ABDOMINIS PLANE (TAP) BLOCK - BILATERAL  SURGEON:  Eddye Goodie, MD  ASSISTANT:  (n/a)   ANESTHESIA:  General endotracheal intubation anesthesia (GETA) and Regional TRANSVERSUS ABDOMINIS PLANE (TAP) nerve block -BILATERAL for perioperative & postoperative pain control at the level of the transverse abdominis & preperitoneal spaces along the flank at the anterior axillary line, from subcostal ridge to iliac crest under laparoscopic guidance provided with liposomal bupivacaine  (Experel) 20mL mixed with 60 mL of bupivicaine 0.25% with epinephrine   Estimated Blood Loss (EBL):   Total I/O In: 1000 [I.V.:1000] Out: 50 [Blood:50].   (See anesthesia record)  Delay start of Pharmacological VTE agent (>24hrs) due to concerns of significant anemia, surgical blood loss, or risk of bleeding?:  no  DRAINS: (None)  SPECIMEN:   - Right subcostal mostly subfascial abdominal mass (preop biopsy of metastatic anal cancer) - Right lateral flank intraperitoneal nodule (sec metastasis versus  fat necrosis)  DISPOSITION OF SPECIMEN:  Pathology  COUNTS:  Sponge, needle, & instrument counts CORRECT at the conclusion of the case.      PLAN OF CARE: Admit for overnight observation  PATIENT DISPOSITION:  PACU - hemodynamically stable.  INDICATION: Patient with history of anal cancer status post nigra protocol with failure requiring abdominal perineal restriction with right sided VRAM flap pelvic floor reconstruction.  Recovered.  Had isolated lung metastasis that was treated resection.  Has isolated abdominal wall mass that is the only location of metastasis that is persistent and gotten larger and symptomatic.  Request made by medical oncology to remove.  I offered laparoscopic possible open resection of mass with probable mesh abdominal wall reconstruction.    The anatomy & physiology of the abdominal wall was discussed. The pathophysiology of abdominal masses and hernias hernias was discussed. Natural history risks without surgery discussed. Contributors to complications such as smoking, obesity, diabetes, prior surgery, etc were discussed. I feel the risks of no intervention will lead to serious problems that outweigh the operative risks; therefore, I recommended surgery to resect the abdominal mass in most likely use mesh to help reconstruct the abdominal wall.  I explained laparoscopic techniques with possible need for an open approach. I noted the probable use of mesh to patch and/or buttress the hernia repair.  Risks such as bleeding, infection, abscess, need for further treatment, heart attack, death, and other risks were discussed. I noted a good likelihood this will help address the problem. Goals of post-operative recovery were discussed as well. Possibility that this will not correct all symptoms was explained. I stressed the importance of low-impact activity, aggressive pain control, avoiding constipation, & not pushing through pain to minimize risk of post-operative chronic pain or  injury. Possibility of reherniation especially  with smoking, obesity, diabetes, immunosuppression, and other health conditions was discussed. We will work to minimize complications.   An educational handout further explaining the pathology & treatment options was given as well. Questions were answered. The patient expresses understanding & wishes to proceed with surgery.   OR FINDINGS: Concrete hard fibrotic 5 x 4 x 4 cm mass mostly subfascial pushing into the peritoneum.  No intraperitoneal adhesions or invasion.  Excised with 2 cm margins circumferentially.  Second ellipsoid 2 x 2 x 1 cm mass along right flank most likely a location of fat necrosis/epiploic appendagitis.  Mass excised to rule out second positive metastasis.  Moderate adhesions.  One dense adhesion causing some kinking of the small bowel in the right lower quadrant freed off carefully with robotic and cold scissors.  Most infraumbilical adhesions left alone  Type of ventral wall repair:  Laparoscopic underlay repair with Primary repair of largest hernia Placement of mesh: Intraperitoneal underlay repair Name of mesh: Bard Ventralight dual sided (polypropylene / Seprafilm) Size of mesh: 23x17cm Orientation: Transverse Mesh overlap:  7cm    DESCRIPTION:   Informed consent was confirmed. The patient underwent general anaesthesia without difficulty. The patient was positioned appropriately. VTE prevention in place. The patient's abdomen was clipped, prepped, & draped in a sterile fashion. Surgical timeout confirmed our plan.  The patient was positioned in reverse Trendelenburg. Abdominal entry was gained using various in the left subcostal Palmer's point insufflation to 15 mmHg with good insufflation.  Placed the first port in the same location using laparoscopic optical entry technique in the left upper abdomen. Entry was clean. I induced carbon dioxide insufflation. Camera inspection revealed no injury. Extra ports were carefully  placed under direct laparoscopic visualization.   I could see adhesions on the parietal peritoneum under the abdominal wall.  I did laparoscopic lysis of adhesions to expose the entire supraumbilical anterior abdominal wall using harmonic dissection.  Could see the nodular mass in the right upper quadrant peritoneum pushing into it a few centimeters inferior to the costal ridge.on.   freed off the falciform ligament and central peritoneum to expose the retrorectus fascia   I made sure hemostasis was good.  Compared to his CAT scan consistent with a metastasis.  Proceeded to excise the mass using a harmonic  scalpel with grossly 2 cm margins circumferentially at the level of the peritoneum and posterior fascia and musculature.  Gradually came and excised a cylinder of negative margin around the mass.  Took care especially to come through the rectus muscle cephalad to where the VRAM flap donation had been.  I was able to say supraumbilically to avoid any injury to the rotation flap coming off the inferior epigastrics.  Came into the subcutaneous soft tissues to have good grossly negative superficial margin as well.  Specimen was placed in EcoSac.  I placed a 12 Mody port in the right subcostal region and use that to help bring the mass out.  I did have to open up the skin and fascia to get the mass out.  Hemostasis was good.    Mobilized the colon in a lateral medial fashion.  Felt a hard fibrotic adhesion nodule that was on the mid ascending colon near an epiploic appendage.  Hard to say if it was just fat necrosis versus in the metastasis but I excised that and remove that inside an EcoSac as well.  She had a dense kinking of small bowel in the right lower quadrant with concern of a  transition zone so I freed that off with harmonic and cold scissors.  That untwisted opened it up well.  There is some other infraumbilical nonobstructing adhesions of the terminal ileum in the right lower quadrant as well as some  omentum infraumbilically.  I left that alone.  I focused on abdominal reconstruction.  I mapped out the region using a needle passer.   11 x 7 cm defect through much of the abdominal wall fascia.  To minimize recurrence & ensure that I would have at least 5 cm radial coverage beyond the hernia defect, I chose a 23x17cm dual sided mesh.  I placed #1 PDS stitches around its medial and inferior edge edge about every 5 cm = 6 total.  I rolled the mesh & placed into the peritoneal cavity through the hernia defect.  I unrolled the mesh and positioned it appropriately.  I made sure that the superior circumference went subcostally.  Use some tackers to help centralize it and hold it in place.  I secured the mesh to cover up the hernia defect using a laparoscopic suture passer to pass the tails of the PDS through the abdominal wall & tagged them with clamps for good transfascial suturing.  I started out in four corners to make sure I had the mesh centered under the hernia defect appropriately, and then proceeded to work in quadrants.    We evacuated CO2 & desufflated the abdomen.  I tied the fascial stitches down. I closed the fascial defect that I placed the mesh through using #1 PDS primarily interrupted stitchestransversely.  I reinsufflated the abdomen. The mesh provided at least circumferential coverage around the entire region of hernia defects.  I secured the mesh centrally with an additional trans fascial stitches in & out the mesh using #1 PDS under laparoscopic visualization along the right lateral flank and subcostal ridge and subxiphoid region x 4 total.   I tacked the edges & central part of the mesh to the peritoneum/posterior rectus fascia with SecureStrap absorbable tacks, tacking the falciform ligament to cover much of the superior medial corner..   I did reinspection. Hemostasis was good. Mesh laid well. I completed a broad field block of local anesthesia at fascial stitch sites & fascial closure areas.     Capnoperitoneum was evacuated. Ports were removed. The skin was closed with Monocryl at the port sites and Steri-Strips on the fascial stitch puncture sites.  Patient is being extubated to go to the recovery room.  I discussed operative findings, updated the patient's status, discussed probable steps to recovery, and gave postoperative recommendations to the patient's spouse.  Recommendations were made.  Questions were answered.  He expressed understanding & appreciation.  Eddye Goodie, M.D., F.A.C.S. Gastrointestinal and Minimally Invasive Surgery [facility] [address] [place], [place] [address] [phone] Main / Paging  06/05/2024 12:38 PM

## 2024-06-05 NOTE — Discharge Instructions (Addendum)
 HERNIA REPAIR: POST OP INSTRUCTIONS  ######################################################################  EAT Gradually transition to a high fiber diet with a fiber supplement over the next few weeks after discharge.  Start with a pureed / full liquid diet (see below)  WALK Walk an hour a day.  Control your pain to do that.    CONTROL PAIN Control pain so that you can walk, sleep, tolerate sneezing/coughing, and go up/down stairs.  HAVE A BOWEL MOVEMENT DAILY Keep your bowels regular to avoid problems.  OK to try a laxative to override constipation.  OK to use an antidairrheal to slow down diarrhea.  Call if not better after 2 tries  CALL IF YOU HAVE PROBLEMS/CONCERNS Call if you are still struggling despite following these instructions. Call if you have concerns not answered by these instructions  ######################################################################    DIET: Follow a light bland diet & liquids the first 24 hours after arrival home, such as soup, liquids, starches, etc.  Be sure to drink plenty of fluids.  Quickly advance to a usual solid diet within a few days.  Avoid fast food or heavy meals as your are more likely to get nauseated or have irregular bowels.  A low-fat, high-fiber diet for the rest of your life is ideal.   Take your usually prescribed home medications unless otherwise directed.  PAIN CONTROL: Pain is best controlled by a usual combination of three different methods TOGETHER: Ice/Heat Over the counter pain medication Prescription pain medication Most patients will experience some swelling and bruising around the hernia(s) such as the bellybutton, groins, or old incisions.  Ice packs or heating pads (30-60 minutes up to 6 times a day) will help. Use ice for the first few days to help decrease swelling and bruising, then switch to heat to help relax tight/sore spots and speed recovery.  Some people prefer to use ice alone, heat alone, alternating  between ice & heat.  Experiment to what works for you.  Swelling and bruising can take several weeks to resolve.   It is helpful to take an over-the-counter pain medication regularly for the first few weeks.  Choose one of the following that works best for you: Naproxen  (Aleve , etc)  Two 220mg  tabs twice a day Ibuprofen (Advil, etc) Three 200mg  tabs four times a day (every meal & bedtime) Acetaminophen  (Tylenol , etc) 325-650mg  four times a day (every meal & bedtime) A  prescription for pain medication should be given to you upon discharge.  Take your pain medication as prescribed.  If you are having problems/concerns with the prescription medicine (does not control pain, nausea, vomiting, rash, itching, etc), please call us  [phone] to see if we need to switch you to a different pain medicine that will work better for you and/or control your side effect better. If you need a refill on your pain medication, please contact your pharmacy.  They will contact our office to request authorization. Prescriptions will not be filled after 5 pm or on week-ends.  AVOID GETTING CONSTIPATED.   Between the surgery and the pain medications, it is common to experience some constipation.  Drink plenty of liquids Take a fiber supplement 2 times day (such as Metamucil, Citrucel, FiberCon, MiraLax , etc) to have a bowel movement every day. If you have not had a BM by 2 days after surgery: -drink liquids only until you have a bowel movement -take MiraLAX  2 doses every 2 hours until you have a bowel movement   Wash / shower every day.  You may  shower over the dressings as they are waterproof.    It is good for closed incisions and even open wounds to be washed every day.  Shower every day.  Short baths are fine.  Wash the incisions and wounds clean with soap & water .    You may leave closed incisions open to air if it is dry.   You may cover the incision with clean gauze & replace it after your daily shower for  comfort.  TEGADERM & STERISTRIPS:  You have clear gauze band-aid dressings over your closed incision(s).  You also have skin tapes called Steristrips on your incisions.  Leave these in place.  You may trim any edges that curl up with clean scissors.  You may remove all dressings & tapes in the shower in 2 days = 6/21/Saturday.    ACTIVITIES as tolerated:   You may resume regular (light) daily activities beginning the next day--such as daily self-care, walking, climbing stairs--gradually increasing activities as tolerated.  Control your pain so that you can walk an hour a day.  If you can walk 30 minutes without difficulty, it is safe to try more intense activity such as jogging, treadmill, bicycling, low-impact aerobics, swimming, etc. Save the most intensive and strenuous activity for last such as sit-ups, heavy lifting, contact sports, etc  Refrain from any heavy lifting or straining until you are off narcotics for pain control.   DO NOT PUSH THROUGH PAIN.  Let pain be your guide: If it hurts to do something, don't do it.  Pain is your body warning you to avoid that activity for another week until the pain goes down. You may drive when you are no longer taking prescription pain medication, you can comfortably wear a seatbelt, and you can safely maneuver your car and apply brakes. You may have sexual intercourse when it is comfortable.   FOLLOW UP in our office Please call [facility] at [phone] to set up an appointment to see your surgeon in the office for a follow-up appointment approximately 2-3 weeks after your surgery. Make sure that you call for this appointment the day you arrive home to insure a convenient appointment time.  9.  If you have disability of FMLA / Family leave forms, please bring the forms to the office for processing.  (do not give to your surgeon).  WHEN TO CALL US  [phone]: Poor pain control Reactions / problems with new medications (rash/itching, nausea, etc)   Fever over 101.5 F (38.5 C) Inability to urinate Nausea and/or vomiting Worsening swelling or bruising Continued bleeding from incision. Increased pain, redness, or drainage from the incision   The clinic staff is available to answer your questions during regular business hours (8:30am-5pm).  Please don't hesitate to call and ask to speak to one of our nurses for clinical concerns.   If you have a medical emergency, go to the nearest emergency room or call 911.  A surgeon from [facility] is always on call at the hospitals in [facility] [place] [address], [place], [place]  [address] ?  [address] [address], [place], [place]   [address] MAIN: [phone] ? TOLL FREE: [phone] ? FAX: [phone] [email or web address]

## 2024-06-05 NOTE — Transfer of Care (Signed)
 Immediate Anesthesia Transfer of Care Note  Patient: Beth [name]  Procedure(s) Performed: EXCISION, NEOPLASM, ABDOMINAL WALL LAPAROSCOPY, DIAGNOSTIC  Patient Location: PACU  Anesthesia Type:General  Level of Consciousness: awake and alert   Airway & Oxygen Therapy: Patient Spontanous Breathing and Patient connected to face mask oxygen  Post-op Assessment: Report given to RN and Post -op Vital signs reviewed and stable  Post vital signs: Reviewed and stable  Last Vitals:  Vitals Value Taken Time  BP 192/112 06/05/24 12:40  Temp    Pulse 91 06/05/24 12:43  Resp 10 06/05/24 12:43  SpO2 99 % 06/05/24 12:43  Vitals shown include unfiled device data.  Last Pain:  Vitals:   06/05/24 0847  TempSrc:   PainSc: 7          Complications: No notable events documented.

## 2024-06-05 NOTE — Plan of Care (Signed)

## 2024-06-05 NOTE — Anesthesia Postprocedure Evaluation (Signed)
 Anesthesia Post Note  Patient: Baily S Kerstetter  Procedure(s) Performed: EXCISION, NEOPLASM, ABDOMINAL WALL LAPAROSCOPY, DIAGNOSTIC     Patient location during evaluation: PACU Anesthesia Type: General Level of consciousness: awake and alert, oriented and patient cooperative Pain management: pain level controlled Vital Signs Assessment: post-procedure vital signs reviewed and stable Respiratory status: spontaneous breathing, nonlabored ventilation and respiratory function stable Cardiovascular status: blood pressure returned to baseline and stable Postop Assessment: no apparent nausea or vomiting Anesthetic complications: no   No notable events documented.  Last Vitals:  Vitals:   06/05/24 1330 06/05/24 1345  BP: (!) 148/102 (!) 140/91  Pulse: 95 91  Resp: 18 11  Temp:    SpO2: 94% 94%    Last Pain:  Vitals:   06/05/24 1345  TempSrc:   PainSc: Asleep                 Jacquelyne Matte

## 2024-06-05 NOTE — Interval H&P Note (Signed)
 History and Physical Interval Note:  06/05/2024 9:58 AM  Beth [name]  has presented today for surgery, with the diagnosis of ABDOMINAL WALL METASTASIS HISTORY OF ANAL CANCER.  The various methods of treatment have been discussed with the patient and family. After consideration of risks, benefits and other options for treatment, the patient has consented to  Procedure(s) with comments: EXCISION, NEOPLASM, ABDOMINAL WALL (N/A) - REMOVAL OF ABDOMINAL WALL METASTASIS, POSSIBLE MESH HERNIA REPAIR/ABD WALL RECONSTRUCTION DIAGNOSTIC LAPAROSCOPIC LAPAROSCOPY, DIAGNOSTIC (N/A) as a surgical intervention.  The patient's history has been reviewed, patient examined, no change in status, stable for surgery.  I have reviewed the patient's chart and labs.  Questions were answered to the patient's satisfaction.    I have re-reviewed the the patient's records, history, medications, and allergies.  I have re-examined the patient.  I again discussed intraoperative plans and goals of post-operative recovery.  The patient agrees to proceed.  Beth [name]  May 28, 1971 [id number]  Patient Care Team: Leesa Pulling, MD as PCP - General (Unknown Physician Specialty) Candyce Champagne, MD as Consulting Physician (General Surgery) Celedonio Coil, MD as Consulting Physician (Gastroenterology) Darl Edu, MD as Referring Physician (Obstetrics and Gynecology) Irby Mannan, MD (Inactive) as Consulting Physician (Gastroenterology) Sumner Ends, MD as Consulting Physician (Oncology) Lutricia Salts, Jordis Nevins, RN as Oncology Nurse Navigator O'Kelley, Jordis Nevins, RN as Oncology Nurse Navigator  Patient Active Problem List   Diagnosis Date Noted   Squamous cell carcinoma of anal canal (HCC) 02/25/2021    Priority: High   Genetic testing 11/26/2023   Hematuria of unknown etiology 04/17/2023   Steatosis of liver 04/06/2023   S/P lobectomy of lung 12/29/2022   Colostomy complication (HCC) 11/07/2022   Slow transit  constipation    Peristomal dermatitis    Colostomy care (HCC)    Menopausal symptom 03/22/2022   Vitamin D  deficiency 02/28/2022   Herpes simplex 02/28/2022   SBO (small bowel obstruction) s/p lysis of adhesions 12/04/2021 12/06/2021   S/P exploratory laparotomy 12/04/2021   Anal squamous cell carcinoma (HCC) 03/01/2021   History of migraine    Malignant tumor of colon (HCC) 02/03/2021   Encounter for screening colonoscopy    Rectal polyp    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 08/30/2016 10/07/2016   Tobacco abuse 08/08/2016   Chronic anal fissure s/p partial sphincterotomy 08/30/2016 08/08/2016   Constipation, chronic 08/08/2016    Past Medical History:  Diagnosis Date   Acne    on face spirolactone for   Anal cancer (HCC) 02/25/2021   Constipation, chronic 08/08/2016   Diabetes mellitus without complication (HCC) 08/2022   Type II   Family history of adverse reaction to anesthesia    Mother has trouble waking takes longer   Herpes simplex    History of migraine    none in years   Hypercholesteremia    Wears glasses     Past Surgical History:  Procedure Laterality Date   ABDOMINAL HYSTERECTOMY  2016   partial   COLONOSCOPY     COLONOSCOPY WITH PROPOFOL  N/A 01/28/2021   Procedure: COLONOSCOPY WITH PROPOFOL ;  Surgeon: Irby Mannan, MD;  Location: [facility];  Service: Endoscopy;  Laterality: N/A;   EXCISION HYDRADENITIS LABIA Left 02/25/2021   Procedure: EXCISION of  LABIAL LESIOM;  Surgeon: Candyce Champagne, MD;  Location[facility];  Service: General;  Laterality: Left;   INTERCOSTAL NERVE BLOCK  12/29/2022   Procedure: INTERCOSTAL NERVE BLOCK;  Surgeon: Zelphia Higashi, MD;  Location: [facility] OR;  Service: Thoracic;;   IR CV LINE INJECTION  02/01/2024   IR FLUORO GUIDE CV LINE RIGHT  04/18/2021   IR IMAGING GUIDED PORT INSERTION  10/18/2023   IR PATIENT EVAL TECH 0-60 MINS  01/16/2024   IR PATIENT EVAL TECH 0-60  MINS  01/25/2024   LAPAROTOMY N/A 12/03/2021   Procedure: EXPLORATORY LAPAROTOMY, LYSIS OF ADHESIONS;  Surgeon: Shela Derby, MD;  Location: [facility] OR;  Service: General;  Laterality: N/A;   LYMPH NODE DISSECTION Right 12/29/2022   Procedure: LYMPH NODE DISSECTION;  Surgeon: Zelphia Higashi, MD;  Location: [facility] OR;  Service: Thoracic;  Laterality: Right;   OSTOMY N/A 11/03/2021   Procedure: END COLOSTOMY;  Surgeon: Candyce Champagne, MD;  Location: [facility];  Service: General;  Laterality: N/A;   PECTORALIS FLAP N/A 11/03/2021   Procedure: VERTICAL RECTUS ABDOMINIS MYOCUTANEOUS TO PERINEUM;  Surgeon: Alger Infield, MD;  Location: [facility];  Service: Plastics;  Laterality: N/A;   RECTAL BIOPSY N/A 09/30/2021   Procedure: ANORECTAL MASS BIOPSY;  Surgeon: Candyce Champagne, MD;  Location: [facility];  Service: General;  Laterality: N/A;   RECTAL EXAM UNDER ANESTHESIA N/A 02/25/2021   Procedure: ANORECTAL EXAM UNDER ANESTHESIA;  Surgeon: Candyce Champagne, MD;  Location: [facility];  Service: General;  Laterality: N/A;   RECTAL EXAM UNDER ANESTHESIA N/A 09/30/2021   Procedure: ANORECTAL EXAMINATION UNDER ANESTHESIA;  Surgeon: Candyce Champagne, MD;  Location: [facility];  Service: General;  Laterality: N/A;  GEN & LOCAL ANESTHESIA   SPHINCTEROTOMY N/A 08/30/2016   Procedure: LATERAL INTERNAL AND SPHINCTEROTOMY;  Surgeon: Candyce Champagne, MD;  Location: [facility];  Service: General;  Laterality: N/A;   TONSILLECTOMY  as child   TRANSANAL EXCISION OF RECTAL MASS N/A 02/25/2021   Procedure: TRANSANAL EXCISIONAL  BIOPSY OF ANAL CANAL MASS;  Surgeon: Candyce Champagne, MD;  Location: [facility];  Service: General;  Laterality: N/A;   XI ROBOT ABDOMINAL PERINEAL RESECTION N/A 11/03/2021   Procedure: XI ROBOT ABDOMINOPERINEAL RESECTION;  Surgeon: Candyce Champagne, MD;  Location: [facility];  Service: General;  Laterality: N/A;    Social History   Socioeconomic History   Marital status: Married    Spouse name:  Willey Harrier   Number of children: 3   Years of education: Not on file   Highest education level: Not on file  Occupational History   Not on file  Tobacco Use   Smoking status: Former    Current packs/day: 0.00    Average packs/day: 1 pack/day for 8.0 years (8.0 ttl pk-yrs)    Types: Cigarettes    Start date: 02/15/2013    Quit date: 02/15/2021    Years since quitting: 3.3   Smokeless tobacco: Never  Vaping Use   Vaping status: Never Used  Substance and Sexual Activity   Alcohol use: Yes    Alcohol/week: 14.0 standard drinks of alcohol    Types: 14 Shots of liquor per week    Comment: 2 dirnks daily   Drug use: No   Sexual activity: Yes    Birth control/protection: Surgical    Comment: Hysterectomy  Other Topics Concern   Not on file  Social History Narrative   Not on file   Social Drivers of Health   Financial Resource Strain: Not on file  Food Insecurity: Not on file  Transportation Needs: Not on file  Physical Activity: Inactive (11/12/2017)   Exercise Vital Sign    Days of Exercise  per Week: 0 days    Minutes of Exercise per Session: 0 min  Stress: No Stress Concern Present (11/12/2017)   [employer] of Occupational Health - Occupational Stress Questionnaire    Feeling of Stress : Only a little  Social Connections: Somewhat Isolated (11/12/2017)   Social Connection and Isolation Panel    Frequency of Communication with Friends and Family: More than three times a week    Frequency of Social Gatherings with Friends and Family: Once a week    Attends Religious Services: Never    Database administrator or Organizations: No    Attends Banker Meetings: Never    Marital Status: Married  Catering manager Violence: Not At Risk (11/12/2017)   Humiliation, Afraid, Rape, and Kick questionnaire    Fear of Current or Ex-Partner: No    Emotionally Abused: No    Physically Abused: No    Sexually Abused: No    Family History  Problem Relation Age of Onset    Breast cancer Paternal Aunt 64       neg GT   Prostate cancer Paternal Uncle        dx unkown age   Skin cancer Maternal Grandfather        face; dx >50   Cancer Other        breast ca in PGM's sister; metastatic prostate cancer in PGM's brother; lung cancer in PGM's sister   Breast cancer Cousin        paternal female cousin; dx before age 9   Cancer Paternal Great-grandfather        liver; PGM's father   Cancer Paternal Great-grandmother        unk GYN; PGM's mother    Medications Prior to Admission  Medication Sig Dispense Refill Last Dose/Taking   acetaminophen  (TYLENOL ) 500 MG tablet Take 1,000 mg by mouth every 6 (six) hours as needed (for pain.).   Past Week   B Complex Vitamins (VITAMIN B COMPLEX PO) Take 1 each by mouth daily. Gummies   Past Week   cetirizine (ZYRTEC) 10 MG tablet Take 10 mg by mouth daily as needed for allergies.   06/05/2024 Morning   clobetasol ointment (TEMOVATE) 0.05 % Apply 1 Application topically at bedtime as needed (Apply to palms at bedtime and cover with gloves).   Past Month   docusate sodium  (COLACE) 100 MG capsule Take 200 mg by mouth daily.   06/04/2024   fluticasone (FLONASE) 50 MCG/ACT nasal spray Place 1 spray into both nostrils daily as needed for allergies or rhinitis.   Past Week   gabapentin  (NEURONTIN ) 300 MG capsule Take 300 mg by mouth 3 (three) times daily. Takes w/600 mg capsule to equal 900 mg   06/05/2024 at  6:15 AM   gabapentin  (NEURONTIN ) 600 MG tablet Take 600 mg by mouth in the morning, at noon, and at bedtime. Morning, afternoon & night   06/05/2024 at  6:15 AM   glipiZIDE (GLUCOTROL XL) 2.5 MG 24 hr tablet Take 2.5 mg by mouth every morning.   06/04/2024   HUMULIN R  100 UNIT/ML injection Inject into the skin 3 (three) times daily before meals. Sliding scale: 1-4 units based on glucose   06/04/2024   hydrOXYzine  (ATARAX ) 25 MG tablet Take 25 mg by mouth at bedtime.   Past Week   lidocaine -prilocaine  (EMLA ) cream Apply 1 Application  topically as needed (Apply to port area 1-2 hours, start using 2 weeks after placement). 30 g 2 Past  Week   ondansetron  (ZOFRAN ) 8 MG tablet Take 1 tablet (8 mg total) by mouth every 8 (eight) hours as needed (Take after 3 days after chemo as needed for nausea). 20 tablet 2 Past Month   oxyCODONE  (OXY IR/ROXICODONE ) 5 MG immediate release tablet Take 0.5-1 tablets (2.5-5 mg total) by mouth every 4 (four) hours as needed for severe pain (pain score 7-10). 100 tablet 0 06/04/2024   pantoprazole  (PROTONIX ) 40 MG tablet Take 1 tablet (40 mg total) by mouth daily. 30 tablet 2 06/05/2024 Morning   polyethylene glycol (MIRALAX  / GLYCOLAX ) 17 g packet Take 17 g by mouth in the morning.   06/04/2024   prochlorperazine  (COMPAZINE ) 10 MG tablet Take 1 tablet (10 mg total) by mouth every 6 (six) hours as needed for nausea or vomiting. 30 tablet 2 Past Month   rosuvastatin (CRESTOR) 10 MG tablet Take 10 mg by mouth every other day.   Past Week   SEMGLEE, YFGN, 100 UNIT/ML Pen Inject 35 Units into the skin at bedtime.   06/04/2024   spironolactone  (ALDACTONE ) 100 MG tablet Take 100 mg by mouth daily in the afternoon.   06/04/2024   triamcinolone  cream (KENALOG ) 0.5 % Apply 1 Application topically 2 (two) times daily as needed (skin irritation.). On legs   Past Week   valACYclovir  (VALTREX ) 500 MG tablet Take 500 mg by mouth in the morning.   06/04/2024   ibuprofen (ADVIL) 200 MG tablet Take 400 mg by mouth every 8 (eight) hours as needed (for pain.).   More than a month    Current Facility-Administered Medications  Medication Dose Route Frequency Provider Last Rate Last Admin   ceFAZolin  (ANCEF ) IVPB 2g/100 mL premix  2 g Intravenous On Call to OR Candyce Champagne, MD       Chlorhexidine  Gluconate Cloth 2 % PADS 6 each  6 each Topical Once Jhania Etherington, Landon Pinion, MD       And   Chlorhexidine  Gluconate Cloth 2 % PADS 6 each  6 each Topical Once Tyshon Fanning, Landon Pinion, MD       gabapentin  (NEURONTIN ) capsule 300 mg  300 mg Oral On Call to  OR Candyce Champagne, MD       insulin  aspart (novoLOG ) injection 0-14 Units  0-14 Units Subcutaneous Q2H PRN Finucane, Elizabeth M, DO   2 Units at 06/05/24 1610   lactated ringers  infusion   Intravenous Continuous Juventino Oppenheim, MD 10 mL/hr at 06/05/24 0854 New Bag at 06/05/24 0854   Facility-Administered Medications Ordered in Other Encounters  Medication Dose Route Frequency Provider Last Rate Last Admin   0.9 %  sodium chloride  infusion   Intravenous Continuous Sumner Ends, MD   Stopped at 12/21/23 1531   heparin  lock flush 100 unit/mL  500 Units Intracatheter Once PRN Sumner Ends, MD       sodium chloride  flush (NS) 0.9 % injection 10 mL  10 mL Intracatheter PRN Sumner Ends, MD       sodium chloride  flush (NS) 0.9 % injection 10 mL  10 mL Intravenous PRN Sherrill, Gary B, MD   10 mL at 05/29/24 1443     Allergies  Allergen Reactions   Clindamycin /Lincomycin Rash   Sulfa Drugs Cross Reactors Itching and Rash    BP (!) 148/99   Pulse 87   Temp 98 F (36.7 C) (Oral)   Resp 16   Ht 5' 7 (1.702 m)   Wt 78.9 kg   LMP 03/13/2012   SpO2 98%  BMI 27.24 kg/m   Labs: Results for orders placed or performed during the hospital encounter of 06/05/24 (from the past 48 hours)  Glucose, capillary     Status: Abnormal   Collection Time: 06/05/24  8:34 AM  Result Value Ref Range   Glucose-Capillary 150 (H) 70 - 99 mg/dL    Comment: Glucose reference range applies only to samples taken after fasting for at least 8 hours.   Comment 1 Notify RN     Imaging / Studies: CT ABDOMEN PELVIS W CONTRAST Result Date: 05/10/2024 EXAMINATION: CT ABDOMEN PELVIS W CONTRAST CLINICAL INDICATION: Female, 53 years old. Abdominal pain, acute, nonlocalized TECHNIQUE: Axial CT of the abdomen and pelvis with 100 cc Isovue  300 intravenous contrast. Multiplanar reformations provided. Unless otherwise specified, incidental thyroid , adrenal, renal lesions do not require dedicated imaging follow up.  Additionally, any mentioned pulmonary nodules do not require dedicated imaging follow-up based on the Fleischner guidelines unless otherwise specified. Coronary calcifications are not identified unless otherwise specified. COMPARISON: 03/14/2024 FINDINGS: The lung bases are clear other than minimal atelectatic changes dependently in the left lower lobe. The heart is normal in size. The liver appears normal. The gallbladder is normal. There is interval growth of the soft tissue mass in the right anterior abdominal wall now measuring 2.9 x 2.0 cm (series 2 image 31). The spleen is normal. Pancreas is normal. The adrenals are normal. The kidneys are normal. The abdominal aorta is normal in caliber. Scattered atherosclerotic changes are present. The bladder is normal. The uterus is surgically absent. Partial colectomy noted with a left-sided colostomy. Large and small bowel loops are otherwise within normal limits. There is no free fluid or pathologic lymphadenopathy by size criteria. There are degenerative changes of the spine and bony pelvis. There is avascular necrosis of the femoral heads. IMPRESSION: No acute findings in the abdomen or pelvis. Interval growth of the soft tissue mass in the right anterior abdominal wall concerning for metastatic disease. DOSE REDUCTION: This exam was performed according to our departmental dose-optimization program which includes automated exposure control, adjustment of the mA and/or kV according to patient size and/or use of iterative reconstruction technique. Electronically signed by: Italy Engel MD 05/10/2024 07:52 AM EDT RP Workstation: [id number]     .Eddye Goodie, M.D., F.A.C.S. Gastrointestinal and Minimally Invasive Surgery [facility] [address] [place], [place] [address] [phone] Main / Paging  06/05/2024 9:58 AM    Eddye Goodie

## 2024-06-05 NOTE — H&P (Signed)
 06/05/2024    PROVIDER: Girtha Lama, MD  Patient Care Team: Beth Pulling, MD as PCP - General (Family Medicine) Beth [name], Annye Kinds, MD as Consulting Provider (General Surgery) Beth Aas, MD (Radiation Oncology) Arvella Bird, MD (Gastroenterology) Beth Curt Michaela Adie, MD (Hematology and Oncology) Beth Infield, MD (Plastic Surgery)  [facility] MRN: [id number] DOB: 05/12/1971 DATE OF ENCOUNTER: 04/22/2024  Interval History:   The patient returns to the office after undergoing  Prior excision of AIN III, 07/2016-09/2016 (Lost to follow-up) Anorectal examination under anesthesia with biopsy of anal mass 02/25/2021 Pathology consistent with squamous cell carcinoma anus with some neuroendocrine features  -Modified Nigro chemoradiation therapy [name] for mixed high-grade neuroendocrine squamous cell carcinoma. - completed 04/27/2021  Anorectal examination under anesthesia with biopsy of recurrent anal mass. Biopsy consistent with poorly differentiated cancer.  Robotic abdominal perineal resection with end colostomy. Vertical rectus abdominis myocutaneous flap reconstruction of pelvic floor 11/03/2021  Pathology showing negative margins. 0 of 10 lymph nodes. Invasive poorly differentiated carcinoma. -Recurrent tumor demonstrates neuroendocrine/basaloid histology.  Exploratory laparotomy with lysis of adhesions for internal hernia from adhesions causing closed-loop obstruction 12/04/2021 Beth [name].  Patient turns for follow-up. Have not seen her in a few years. Anal cancer with Beth [name] with neuro endocrine involvement. Underwent abdominal perineal resection with APR/VRAM flap 2022 with post adjuvant chemotherapy. Had bowel obstruction due to effusions requiring exploratory laparotomy and bowel resection November 2022. Notes recurrence in 2023.  Concern for recurrence on abdominal wall 08/31/2023 biopsies metastatic disease. Placed on Taxol /carboplatin   with retifanlimab  11/07/2023 - 03/07/2024. No evidence of progression.  Patient had complaints of current/persistent abdominal pain and discomfort and was worried she had recurrent bowel obstruction. No CAT scans done. Oncology wished surgical reevaluation. Patient feels pain in her right upper quadrant just lateral to her right paramedian vertical scar. No problems with eating. No nausea vomiting or diarrhea. No constipation. No fevers or chills.  PRIOR VISIT 12/27/2021 Patient comes in today by herself, 2 weeks after requiring urgent laparotomy for closed-loop bowel obstruction from a band from prior surgery. Dr. Horatio [name]. She opened up and was discharged postop day 4. She is followed up with the ostomy clinic and they are adjusting to a different appliance. Her appetite is good. She is ambulating. Posterotibial. Few episodes of leaking but they will stand ostomy in the appliance proceeding with better. She is due to follow-up with Dr. Marieta [name] for final APR VRAM flap check. She is due to follow-up with cancer center in March.  Oncological history: Squamous cell carcinoma of the anal canal Colonoscopy 01/28/2021-rectal mass extending to the outside, palpable on exam-biopsy revealed high-grade squamous intraepithelial lesion (AIN 3/carcinoma in situ) Exam under anesthesia with transanal excision of an anal canal mass 02/25/2021-frozen section consistent with squamous cell carcinoma, 4 x 4 centimeter sessile anal canal mass extending to the anal verge, right lateral and posterior, 30% circumferential, fixed to the anal sphincter complex; anal rectal mass with mixed high-grade neuroendocrine squamous cell carcinoma; posterior anal tag with squamous cell carcinoma; left labial lesion-condyloma acuminata CTs 03/07/2021-ill-definition of tissue planes along the anus with abnormal soft tissue prominence posteriorly along the anal canal suspicious for mass. Small adjacent lower perirectal lymph nodes in addition to  a 0.5 cm sacral node. Focal enhancing lesion in the right hepatic lobe 1.2 x 0.8 x 0.9 cm. 2.1 x 1.5 cm cystic lesion right ovary with thin enhancing rim. PET scan 03/16/2021-hypermetabolic mass within the anal canal. No definite evidence of local or distant metastatic  disease. 7 mm left submandibular lymph node with mild FDG uptake, likely reactive. Focal site of FDG uptake within the inner quadrant of the right breast. Radiation 03/21/2021-04/27/2021 Cycle one 5-FU/mitomycin -C 03/21/2021 Cycle two 5-FU/Mitomycin -C 04/18/2021, 5-FU dose reduced secondary to mucositis Anoscopy by Dr. Hershell [name] 07/20/2021-divet in the right posterior lateral aspect with some right anterior and right lateral folds consistent with radiation treatment. No ulceration. Not fully resolved but markedly shrunk down. Anorectal mass 09/30/2021 examination under anesthesia, biopsy-poorly differentiated carcinoma-basaloid squamous carcinoma and high-grade neuroendocrine carcinoma, margins positive APR/and vertical rectus abdominis flap 11/03/2021, invasive poorly differentiated carcinoma, negative resection margins, 0/10 nodes, treatment response score 2,ypT1,ypN0 CT abdomen/pelvis 12/03/2021-high-grade small bowel obstruction transitioning in the left lower abdomen. Presacral rim-enhancing 4.4 x 2.8 x 3.8 cm fluid collection. No air in the collection. Prior study described a potential abnormality in the right lobe of the liver, not seen on current study. Cystic lesion of the right ovary resolved. 12/04/2021: Lysis of adhesions secondary to internal hernia CT chest 12/07/2021-interval development of bilateral trace pleural effusions. CTs 12/04/2022-new 2.6 x 2.5 cm right lower lobe nodule PET 12/21/2022-FDG avid right lower lobe nodule, no other evidence of metastatic disease 12/29/2022 right lower lobectomy, lymph node dissection-poorly differentiated carcinoma consistent with metastasis, 4.8 x 2.9 x 2.6 cm, margins free, 7 lymph nodes negative for  carcinoma; cytohistomorphology correlation supports the current tumor being a metastasis from the previously diagnosed anal carcinoma CT chest 04/17/2023-interval right lower lobectomy with resection of right lung mass, pleural thickening and small right pleural effusion, no new mass or lymph node enlargement CTs 07/20/2023-diminished postoperative right pleural effusion. Unchanged postoperative findings status post abdominoperineal resection with left lower quadrant and colostomy. No evidence of metastatic disease. CT abdomen/pelvis 08/31/2023-heterogenous soft tissue nodule in the right upper quadrant anterior abdominal wall, no evidence of metastatic disease CT/ultrasound guided biopsy of right abdominal wall mass 09/07/2023-metastatic neoplasm consistent with a metastasis from anal cancer, Foundation 1-HRD signature negative, MSS, tumor mutation burden 4, BRCA 1 alteration, PD-L1 TPS 10% Cycle 1 day 1 Taxol //carboplatin  10/19/2023, day 8 Taxol  10/26/2023, day 15 Taxol  held 11/02/2023 due to neutropenia Cycle 2-day 1 Taxol /carboplatin  plus Retifanlimab  11/14/2023, Taxol  and carboplatin  dose reduced due to previous neutropenia, mild thrombocytopenia; day 8 Taxol  11/22/2023, day 15 Taxol  11/29/2023 Cycle 3-day 1 Taxol /carboplatin  plus retifanlimab  12/14/2023, day 8 Taxol  12/21/2023, day 15 Taxol  12/28/2023 CT abdomen/pelvis 01/09/2024: Decreased right anterior abdominal wall nodule, no evidence of disease progression Cycle 4-day 1 Taxol /carboplatin /retifanlimab  01/11/2024; day 8 Taxol  01/18/2024 (given peripherally), day 15 Taxol  01/25/2024 (given peripherally) Cycle 5-day 1 Taxol /carboplatin /retifanlimab  02/08/2024, day 8 Taxol  02/13/2024, day 15 Taxol  02/22/2024 Cycle 6-day 1 carboplatin /retifanlimib 03/07/2024, paclitaxel  placed on hold secondary to nail toxicity CT 03/14/2024: Stable right anterior abdominal wall nodule, no evidence of progressive metastatic disease, stable small nodes in the upper retroperitoneum and  mediastinum    Labs, Imaging and Diagnostic Testing:  Located in 'Care Everywhere' section of Epic EMR chart  PRIOR NOTES  Beth [name]: 04/25/2021 9:15 AM Location: [facility] Patient #: [id number] DOB: 02-17-1971 Married / Language: Beth [name] / Race: White Female  History of Present Illness Beth Goodie MD; 04/25/2021 9:55 AM) The patient is a 53 year old female who presents with anal lesions. Note for Anal lesions: ` ` ` The patient returns s/p anorectal examination under anesthesia with biopsy of anal mass 02/25/2021  Pathology consistent with squamous cell carcinoma anus with some neuroendocrine features.  Oncological history so far: 1. Squamous cell carcinoma of the  anal canal ? Colonoscopy 01/28/2021-rectal mass extending to the outside , palpable on exam-biopsy revealed high-grade squamous intraepithelial lesion (AIN 3/carcinoma in situ) ? 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 CTs 03/07/2021-ill-definition of tissue planes along the anus with abnormal soft tissue prominence posteriorly along the anal canal suspicious for mass. Small adjacent lower perirectal lymph nodes in addition to a 0.5 cm sacral node. Focal enhancing lesion in the right hepatic lobe 1.2 x 0.8 x 0.9 cm. 2.1 x 1.5 cm cystic lesion right ovary with thin enhancing rim. ? PET scan 03/16/2021-hypermetabolic mass within the anal canal. No definite evidence of local or distant metastatic disease. 7 mm left submandibular lymph node with mild FDG uptake, likely reactive. Focal site  of FDG uptake within the inner quadrant of the right breast. ? Radiation 03/21/2021 ? Cycle one 5-FU/mitomycin -C 03/21/2021 ? Cycle two 5-FU/Mitomycin -C 04/18/2021, 5-FU dose reduced secondary to mucositis  The patient returns to clinic after surgery, gradually improving. PET scan showed a breast mass. She evaluation for a breast biopsy. Biopsy showed fibrocystic changes. She's been started on modified Nigro [name] starting March 21, 2021. She is due to complete blood count this coming week. She is very tender and sore doesn't want to be examined. Denies nausea, constipation/diarrhea, worsening fatigue, high fevers, or other concerns. She has about 1 bowel movement a day but it is watery. They discussed about using Imodium. She was hesitant to do any topical therapy since she recalls being told the cancer center that would affect treatment.  She was concerned about the diagnosis of cancer. She was lost to follow-up but seemed confused that she thought I had set as needed. She's tried to think forward for now. Getting pain medications through the cancer center. ` ###########################################  Pathology: Diagnosis Breast, right, needle core biopsy, upper, inner - FIBROCYSTIC CHANGES WITH SCLEROSING ADENOSIS. - SMALL FOCUS OF INFLAMMATION AND FIBROSIS SUGGESTIVE OF RESOLVING FAT NECROSIS. - PSEUDOANGIOMATOUS STROMAL HYPERPLASIA (PASH) - NO EVIDENCE OF MALIGNANCY. Microscopic Comment Called to [facility] on 04/12/21. (JDP:kh 04/12/21) Ramey Old Washington MD Pathologist, Electronic Signature (Case signed 04/12/2021) Specimen Beth [name] and Clinical Information Specimen Comment TIF: 8:45 am, CIT: < 2 minutes, subcentimeter enhancing mass, indeterminate, recently diagnosed anal cancer, breast mass seen on PET Specimen(s) Obtained: Breast, right, needle core biopsy, upper, inner Specimen Clinical Information R/o atypia or malignancy Glorene Leitzke Received in formalin labeled with the  patient's name (Beth [name]) and Rt UIQ is a 2.0 x 1.8 x 0.2 cm aggregate of yellow-tan fibrofatty tissue, submitted in toto in a [name] cassette(s). TIF 5638, CIT <2 minutes. (LF, 04/11/21) Report signed out from the following location(s) Technical Component was performed at [facility]. [address],[place] 1 of 2 FINAL for [place], [place] [phone]) Report signed out from the following location(s)(continued) [phone].CLIA:[id number],CAP:[id number]., Interpretation was performed at [facility] [address] [place], [place], [place] [address]. CLIA #: [id number], 2 of 2` ` ###########################################`  02/25/2021  11:59 AM  PATIENT: Beth [name] 53 y.o. female  Patient Care Team: Beth Pulling, MD as PCP - General (Unknown Physician Specialty) Candyce Champagne, MD as Consulting Physician (General Surgery)  Celedonio Coil, MD as Consulting Physician (Gastroenterology) Darl Edu, MD as Referring Physician (Obstetrics and Gynecology) Irby Mannan, MD as Consulting Physician (Gastroenterology)  PRE-OPERATIVE DIAGNOSIS: ANAL CANAL MASS WITH AIN III  POST-OPERATIVE DIAGNOSIS:  Squamous Cell Cancer of the Anal Canal Labial skin mass, probable Condyloma  PROCEDURE:  ANORECTAL EXAM UNDER ANESTHESIA TRANSANAL EXCISIONAL BIOPSY OF ANAL CANAL MASS EXCISION of LABIAL LESION  SURGEON: Beth Goodie, MD  ANESTHESIA:  General Anorectal & Local field block (0.25% bupivacaine  with epinephrine  mixed with Liposomal bupivacaine  (Experel)  EBL: Total I/O In: 900 [I.V.:900] Out: 10 [Blood:10]. See operative record  Delay start of Pharmacological VTE agent (>24hrs) due to surgical blood loss or risk of bleeding: NO  DRAINS: NONE  SPECIMEN:  -Posterior anal canal ulcerated mass with partial excision for biopsy -Posterior midline anal tag. Most likely sentinel tag -Left external lateral labial skin tag.  Acrochordon versus condyloma  DISPOSITION OF SPECIMEN: PATHOLOGY  COUNTS: YES  PLAN OF CARE: Discharge home after PACU  PATIENT DISPOSITION: PACU - hemodynamically stable.  INDICATION: Pleasant patient with history of anal intraepithelial neoplasia status post excision and treatment in 2017. Lost to follow-up. And recurrent swelling and concern. Had colonoscopy which raised concern of recurrent anal canal mass. Biopsy showing at least anal intraepithelial neoplasia. Surgical consultation requested. Patient had discomfort so examination limited in the office. I recommended anorectal examination under anesthesia with removal and/or biopsy.   The anatomy & physiology of the anorectal region was discussed. The pathophysiology of anorectal warts and differential diagnosis was discussed. Natural history risks without surgery was discussed such as further growth and cancer. I stressed the importance of office follow-up to catch early recurrence & minimize/halt progression of disease. Interventions such as cauterization by topical agents were discussed.  The patient's symptoms are not adequately controlled by non-operative treatments. I feel the risks & problems of no surgery outweigh the operative risks; therefore, I recommended surgery to treat the anal warts by removal, ablation and/or cauterization.  Risks such as bleeding, infection, need for further treatment, Risks of bleeding, infection, injury to other organs, need for repair of tissues / organs, reoperation, heart attack, death, and other risks were discussed. I noted a good likelihood this will help address the problem. Goals of post-operative recovery were discussed as well. Possibility that this will not correct all symptoms was explained. Post-operative pain, bleeding, constipation, and other problems after surgery were discussed. We will work to minimize complications. Educational handouts further explaining the pathology, treatment options,  and bowel regimen were given as well. Questions were answered. The patient expresses understanding & wishes to proceed with surgery.  OR FINDINGS: Firm 4 x 4 cm sessile but fungating anal canal mass continuing to the anal verge. Primarily right lateral and posterior. Involving 30% of the circumference. Fixed to and probably going through the posterior anal sphincter complex. Grossly suspicious for cancer. Frozen biopsy done. Discussed with Dr. Lee Public with pathology. Squamous cell carcinoma noted by him on frozen evaluation.Beth [name] Possible focus of high-grade differentiated versus neuroendocrine changes. No evidence of adenocarcinoma.  Small posterior midline sentinel tag. Excised.  7 cm pedunculated verrucous skin tag on left lateral external labia most likely consistent with condyloma versus atypical acrochordon. Excised. No other suspicious lesions.  DESCRIPTION:  Informed consent was confirmed. Patient underwent general anesthesia without difficulty. Patient was placed into prone positioning. The perianal region was prepped and draped in sterile fashion. Surgical time-out confirmed our plan.  I did digital rectal examination and then  transitioned over to anoscopy to get a sense of the anatomy. Findings noted above.  Patient clearly had a firm ulcerative fungating mostly sessile mass involving the anal canal. Fullness going through the sphincters. Highly suspicious for anal cancer. I do scissors to sharply debulk the specimen without involving the sphincters. Sent this for frozen specimen. I assured hemostasis with focused cautery and interrupted Vicryl suture. Patient had persistent posterior midline anal tag. It seemed smooth like a sentinel tag from a prior chronic fissure. I excised it.  I reinspected and hemostasis was good after suturing and pressure held for 15 minutes while I waited for frozen specimen evaluation. And noted a skin mass on the left lateral labia that I excised vertically  and closed with interrupted Monocryl suture and Dermabond.  Frozen specimen confirmed squamous cell cancer, unfortunately. No evidence of adenocarcinoma. Since this was not resectable and potentially better treated with chemoradiation therapy, I completed the case.  I had discussed postop care in detail with the patient in the preop holding area. Instructions for post-operative recovery and prescriptions are written. I discussed operative findings, updated the patient's status, discussed probable steps to recovery, and gave postoperative recommendations to the patient's spouse, Beth [name], Beth [name]. Recommendations were made. Questions were answered. He expressed understanding & appreciation.  Beth [name], M.D., F.A.C.S. Gastrointestinal and Minimally Invasive Surgery [facility] [address], [address] [place], [place] [address] [phone] Main / Paging  Allergies Beth Single, CNA; 04/25/2021 8:58 AM) Sulfa Antibiotics  Allergies Reconciled   Medication History Beth Single, CNA; 04/25/2021 8:59 AM) Triamcinolone  Acetonide (0.1% Cream, External) Active. Nystatin (100000 UNIT/ML Suspension, Mouth/Throat) Active. DILTIAZEM Gel (2% Gel, 1 (one) application External four times daily, Taken starting 04/23/2017) Active. (Apply on anus for 3-6 weeks to allow fissure to heal) oxyCODONE  HCl (5MG  Tablet, 1-2 Oral every six hours, as needed for moderate/severe pain, Taken starting 03/17/2021) Active. MiraLax  (Oral) Active. ProAir  HFA (108 (90 Base)MCG/ACT Aerosol Soln, Inhalation) Active. ValACYclovir  HCl (500MG  Tablet, Oral as needed) Active. Multivitamin Adult (Oral) Active. Medications Reconciled   Physical Exam Beth Goodie MD; 04/25/2021 9:54 AM) General Mental Status - Alert. General Appearance - Not in acute distress. Voice - Normal.  Integumentary Global Assessment Upon inspection and palpation of skin surfaces of the - Distribution of scalp and  body hair is normal. General Characteristics Overall examination of the patient's skin reveals - no rashes and no suspicious lesions.  Head and Neck Head - normocephalic, atraumatic with no lesions or palpable masses. Face Global Assessment - atraumatic, no absence of expression. Neck Global Assessment - no abnormal movements, no decreased range of motion. Trachea - midline. Thyroid  Gland Characteristics - non-tender.  Eye Eyeball - Left - Extraocular movements intact, No Nystagmus - Left. Eyeball - Right - Extraocular movements intact, No Nystagmus - Right. Upper Eyelid - Left - No Cyanotic - Left. Upper Eyelid - Right - No Cyanotic - Right.  Chest and Lung Exam Inspection Accessory muscles - No use of accessory muscles in breathing.  Rectal Note: Patient against any examination.  Given her complaints, recommended at least external examination which was what I would only do at this point. She allowed that. 4 cm radius of pruritus and skin breakdown consider with radiation. Some yellow-white discharge consistent with round wound. Increased sphincter tone. I held off on any internal or other exam. Examination done with female CMA in room  Peripheral Vascular Upper Extremity Inspection - Left - Not Gangrenous, No Petechiae. Inspection -  Right - Not Gangrenous, No Petechiae.  Neurologic Neurologic evaluation reveals - normal attention span and ability to concentrate, able to name objects and repeat phrases. Appropriate fund of knowledge and normal coordination.  Neuropsychiatric Mental status exam performed with findings of - able to articulate well with normal speech/language, rate, volume and coherence and no evidence of hallucinations, delusions, obsessions or homicidal/suicidal ideation. Orientation - oriented X3.  Musculoskeletal Global Assessment Gait and Station - normal gait and station.  Lymphatic General Lymphatics Description - No Generalized  lymphadenopathy.  Assessment & Plan Beth Goodie MD; 04/25/2021 9:56 AM) SQUAMOUS CELL CARCINOMA OF ANUS (C21.0) Impression: Anal cancer arising in the setting of AIN from prior chronic fissure.  Follow-up was confused in 2018. I recommend 6 month follow-up given her history of AIN but she only saw the hemorrhoid follow-up that was when necessary. I apologized for the confusion that stressed that she should call if there is any doubt her concern to clarify things.  She is nearly done with her chemoradiation therapy. Last radiation this week.  Start surveillance in the survivable pathway in 3 months.  Nonetheless, I strongly recommend she do our survivable pathway for anal cancer. Usually start doing digital anoscopic exam 3 months after completion of chemoradiation therapy. 3 months the first year, then every 6 months until year 5.  I again gave copies of the pathology reports. Current Plans Follow up with us  in the office in 3 MONTHS.   Call us  sooner as needed.  Pt Education - [facility] Anal Cancer (AT) Surgical (Rectal & Anoscopic exam) follow-up after Treatement for Anal Cancer: -every 3 months until negative exam x2, then -every 6 months until 5 years, then -as needed thereafter  PRURITUS ANI (L29.0) Impression: Significant pruritus most likely due to radiation and chemotherapy fact.  Recommend she do warm soaks. Consider powder or cotton balls to help dry the area. Sometimes zinc barrier creams are helpful. She recovered recall being told by the cancer center to avoid any of this as it might keep the radiation from resolving. I will reach up to radiation oncology to make sure that that is not truly an issue or not.  I also recommend she get on a fiber supplement to thicken up her stools as she notes they're kind of loose and watery. Then add-on Imodium as needed since she does not have diarrhea. Hopefully she gets further out her stools will normalize and scarring will calm  down. Current Plans Pt Education - [facility] Pruritus Ani (AT) Pt Education - [facility] Good Bowel Health (Beth [name])  Signed electronically by Beth Goodie, MD (04/25/2021 9:56 AM)  SURGERY NOTES:  See epic system.  PATHOLOGY:  See epic system.  Physical Examination:   Constitutional: Not cachectic. Hygeine adequate.  Eyes: Normal extraocular movements. Sclera nonicteric Neuro: No major focal sensory defects. No major motor deficits. Psych: No severe agitation. No severe anxiety. Judgment & insight Adequate, Oriented x4, HENT: Normocephalic, Mucus membranes moist. No thrush. No hair. Wearing a hat. Neck: Supple, No tracheal deviation.  Chest: Good respiratory excursion. No audible wheezing CV: No major extremity edema Ext: No obvious deformity or contracture. Edema: not present. No cyanosis Skin: Warm and dry Musculoskeletal: Severe joint rigidity not present. Moving without any difficulty no restrictions. Not toxic nor sickly. No guarding or hesitancy.  Abdomen: Flat Hernia: Not present. Diastasis recti: Not present. Soft. Nondistended. Nontender. Colostomy left of abdomen pink and flat. Good seal. Soft thin stool in bag Incisions Clean & dry with  normal healing ridge   in the right upper quadrant along the midclavicular line a few centimeters below the costal ridge is a transverse ellipsoid 35 x 15 cm deep mass fixed to fascia abdominal wall that correlates with CAT scan for her last remaining metastasis. Sensitive palpation. No phlegmon or inflammation. Rest of the abdomen is nontender. No Murphy sign. No recurrent hernia.  Gen: Inguinal hernia: Not present. Inguinal lymph nodes: without lymphadenopathy.  Rectal: deferred  Assessment and Plan:   Beth [name] is a 53 y.o. female with ANAL CANCER status post Beth [name]   Status post APR November 2022 with VRAM flap reconstruction.   Status post right lower lobectomy for isolated metastasis 12/29/2022.   Bowel obstruction surgery.    Now with solitary isolated abdominal wall mass right upper quadrant which is the source of her pain   She has right upper quadrant pain that appears to be musculoskeletal and not postprandial/digestive tract. There is no evidence of any biliary colic. There is no evidence of any recurrent bowel obstruction by history physical or CAT scan. She hurts right over the nodule on her right upper quadrant anterior abdominal wall that correlates with the isolated metastasis. Cussed with my colorectal partner, Dr. Beatris Lincoln.  It is odd since the mass has gotten smaller in the chemotherapy that is more symptomatic now, but it is what it is. Could consider PET scan to rule out any other potential metastases before proceeding with the last remaining oligometastasis.  Because she has no evidence of any disease elsewhere and the mass is actually better controlled/shrunk on chemotherapy but more symptomatic; reasonable to consider abdominal wall resection of the mass. Most likely will need mesh bridge repair. Hopefully a few hours. Subcutaneous drain removed in a few weeks. The pathophysiology of skin & subcutaneous masses was discussed. Natural history risks without surgery were discussed. I recommended surgery to remove the mass. I explained the technique of removal with use of local anesthesia & possible need for more aggressive sedation/anesthesia for patient comfort.   Risks such as bleeding, infection, wound breakdown, heart attack, death, and other risks were discussed. I noted a good likelihood this will help address the problem. Possibility that this will not correct all symptoms was explained. Possibility of regrowth/recurrence of the mass was discussed. We will work to minimize complications. Questions were answered. The patient expresses understanding & wishes to proceed with surgery.  Beth Goodie, MD, FACS, MASCRS Esophageal, Gastrointestinal & Colorectal Surgery Robotic and Minimally  Invasive Surgery  [facility] [address] [place], [place] [address] [phone] Fax [phone] Main  CONTACT INFORMATION: Weekday (9AM-5PM): Call [facility] main office at [phone] Weeknight (5PM-9AM) or Weekend/Holiday: Check EPIC Web Links tab & use AMION (password  [email or web address]) for General Surgery [facility] coverage  Please, DO NOT use SecureChat  (it is not reliable communication to reach operating surgeons & will lead to a delay in care).   Epic staff messaging available for outptient concerns needing 1-2 business day response.     06/05/2024

## 2024-06-05 NOTE — Anesthesia Procedure Notes (Signed)
 Procedure Name: Intubation Date/Time: 06/05/2024 10:27 AM  Performed by: Vella Gey, CRNAPre-anesthesia Checklist: Patient identified, Emergency Drugs available, Suction available and Patient being monitored Patient Re-evaluated:Patient Re-evaluated prior to induction Oxygen Delivery Method: Circle system utilized Preoxygenation: Pre-oxygenation with 100% oxygen Induction Type: IV induction Ventilation: Mask ventilation without difficulty and Oral airway inserted - appropriate to patient size Laryngoscope Size: Mac and 4 Grade View: Grade I Tube type: Oral Tube size: 7.5 mm Number of attempts: 1 Airway Equipment and Method: Stylet Placement Confirmation: ETT inserted through vocal cords under direct vision, positive ETCO2 and breath sounds checked- equal and bilateral Secured at: 21 cm Tube secured with: Tape Dental Injury: Teeth and Oropharynx as per pre-operative assessment  Comments: Intubation per Kirke Pepper, EMT, Paramedic student

## 2024-06-06 ENCOUNTER — Encounter (HOSPITAL_COMMUNITY): Payer: Self-pay | Admitting: Surgery

## 2024-06-06 DIAGNOSIS — C7989 Secondary malignant neoplasm of other specified sites: Secondary | ICD-10-CM | POA: Diagnosis not present

## 2024-06-06 LAB — GLUCOSE, CAPILLARY: Glucose-Capillary: 130 mg/dL — ABNORMAL HIGH (ref 70–99)

## 2024-06-06 NOTE — Discharge Summary (Signed)
 Physician Discharge Summary    Beth [name] MRN: [id number] DOB/AGE: 02-27-1971 = 53 y.o.  Patient Care Team: Leesa Pulling, MD as PCP - General (Unknown Physician Specialty) Candyce Champagne, MD as Consulting Physician (General Surgery) Celedonio Coil, MD as Consulting Physician (Gastroenterology) Darl Edu, MD as Referring Physician (Obstetrics and Gynecology) Irby Mannan, MD (Inactive) as Consulting Physician (Gastroenterology) Sumner Ends, MD as Consulting Physician (Oncology) Lutricia Salts, Jordis Nevins, RN as Oncology Nurse Navigator O'Kelley, Jordis Nevins, RN as Oncology Nurse Navigator  Admit date: 06/05/2024  Discharge date: 06/06/2024  Hospital Stay = 0 days    Discharge Diagnoses:  Principal Problem:   Anal cancer (HCC)   1 Day Post-Op  06/05/2024  POST-OPERATIVE DIAGNOSIS:   ABDOMINAL WALL METASTASIS HISTORY OF ANAL CANCER, possibly x2 locations   PROCEDURE:   LAPAROSCOPIC LYSIS OF ADHESIONS EXCISION OF ABDOMINAL WALL METASTASIS EXCISION OF ABDOMINAL WALL MASS ABDOMINAL WALL RECONSTRUCTION WITH MESH TRANSVERSUS ABDOMINIS PLANE (TAP) BLOCK - BILATERAL   SURGEON:  Eddye Goodie, MD  OR FINDINGS: Concrete hard fibrotic 5 x 4 x 4 cm mass mostly subfascial pushing into the peritoneum.  No intraperitoneal adhesions or invasion.  Excised with 2 cm margins circumferentially.   Second ellipsoid 2 x 2 x 1 cm mass along right flank most likely a location of fat necrosis/epiploic appendagitis.  Mass excised to rule out second positive metastasis.  Moderate adhesions.  One dense adhesion causing some kinking of the small bowel in the right lower quadrant freed off carefully with robotic and cold scissors.  Most infraumbilical adhesions left alone  Consults: Anesthesia  Hospital Course:   The patient underwent  the surgery above.  Postoperatively, the patient gradually mobilized and advanced to a solid diet.  Pain and other symptoms were treated aggressively.     By the time of discharge, the patient was walking well the hallways, eating food, having flatus.  Pain was well-controlled on an oral medications.  Based on meeting discharge criteria and continuing to recover, I felt it was safe for the patient to be discharged from the hospital to further recover with close followup. Postoperative recommendations were discussed in detail.  They are written as well.  Discharged Condition: good  Discharge Exam: Blood pressure 124/75, pulse 69, temperature (!) 97.5 F (36.4 C), temperature source Oral, resp. rate 16, height 5' 7 (1.702 m), weight 78.9 kg, last menstrual period 03/13/2012, SpO2 98%.  General: Pt awake/alert/oriented x4 in No acute distress Eyes: PERRL, normal EOM.  Sclera clear.  No icterus Neuro: CN II-XII intact w/o focal sensory/motor deficits. Lymph: No head/neck/groin lymphadenopathy Psych:  No delerium/psychosis/paranoia HENT: Normocephalic, Mucus membranes moist.  No thrush Neck: Supple, No tracheal deviation Chest:  No chest wall pain w good excursion CV:  Pulses intact.  Regular rhythm MS: Normal AROM mjr joints.  No obvious deformity Abdomen: Soft.  Nondistended.  Mildly tender at incisions only.  No evidence of peritonitis.  No incarcerated hernias. Ext:  SCDs BLE.  No mjr edema.  No cyanosis Skin: No petechiae / purpura   Disposition:    Follow-up Information     Candyce Champagne, MD. Schedule an appointment as soon as possible for a visit in 1 month(s).   Specialties: General Surgery, Colon and Rectal Surgery Why: To follow up after your operation Contact information: [address] [place] [address] [phone]                 Discharge disposition: 01-Home or  Self Care       Discharge Instructions     Call MD for:   Complete by: As directed    FEVER > 101.5 F  (temperatures < 101.5 F are not significant)   Call MD for:  extreme fatigue   Complete by: As directed    Call MD  for:  persistant dizziness or light-headedness   Complete by: As directed    Call MD for:  persistant nausea and vomiting   Complete by: As directed    Call MD for:  redness, tenderness, or signs of infection (pain, swelling, redness, odor or green/yellow discharge around incision site)   Complete by: As directed    Call MD for:  severe uncontrolled pain   Complete by: As directed    Diet - low sodium heart healthy   Complete by: As directed    Start with a bland diet such as soups, liquids, starchy foods, low fat foods, etc. the first few days at home. Gradually advance to a solid, low-fat, high fiber diet by the end of the first week at home.   Add a fiber supplement to your diet (Metamucil, etc) If you feel full, bloated, or constipated, stay on a full liquid or pureed/blenderized diet for a few days until you feel better and are no longer constipated.   Discharge instructions   Complete by: As directed    See Discharge Instructions If you are not getting better after two weeks or are noticing you are getting worse, contact our office [phone] for further advice.  We may need to adjust your medications, re-evaluate you in the office, send you to the emergency room, or see what other things we can do to help. The clinic staff is available to answer your questions during regular business hours (8:30am-5pm).  Please don't hesitate to call and ask to speak to one of our nurses for clinical concerns.    A surgeon from [facility] is always on call at the hospitals 24 hours/day If you have a medical emergency, go to the nearest emergency room or call 911.   Discharge wound care:   Complete by: As directed    It is good for closed incisions and even open wounds to be washed every day.  Shower every day.  Short baths are fine.  Wash the incisions and wounds clean with soap & water .    You may leave closed incisions open to air if it is dry.   You may cover the incision with  clean gauze & replace it after your daily shower for comfort.  TEGADERM & STERISTRIPS:  You have clear gauze band-aid dressings over your closed incision(s).  You also have skin tapes called Steristrips on your incisions.  Leave these in place.  You may trim any edges that curl up with clean scissors.  You may remove all dressings & tapes in the shower in 2 days after surgery.   Driving Restrictions   Complete by: As directed    You may drive when: - you are no longer taking narcotic prescription pain medication - you can comfortably wear a seatbelt - you can safely make sudden turns/stops without pain.   Increase activity slowly   Complete by: As directed    Start light daily activities --- self-care, walking, climbing stairs- beginning the day after surgery.  Gradually increase activities as tolerated.  Control your pain to be active.  Stop when you are tired.  Ideally, walk several times  a day, eventually an hour a day.   Most people are back to most day-to-day activities in a few weeks.  It takes 4-6 weeks to get back to unrestricted, intense activity. If you can walk 30 minutes without difficulty, it is safe to try more intense activity such as jogging, treadmill, bicycling, low-impact aerobics, swimming, etc. Save the most intensive and strenuous activity for last (Usually 4-8 weeks after surgery) such as sit-ups, heavy lifting, contact sports, etc.  Refrain from any intense heavy lifting or straining until you are off narcotics for pain control.  You will have off days, but things should improve week-by-week. DO NOT PUSH THROUGH PAIN.  Let pain be your guide: If it hurts to do something, don't do it.   Lifting restrictions   Complete by: As directed    If you can walk 30 minutes without difficulty, it is safe to try more intense activity such as jogging, treadmill, bicycling, low-impact aerobics, swimming, etc. Save the most intensive and strenuous activity for last (Usually 4-8 weeks after  surgery) such as sit-ups, heavy lifting, contact sports, etc.   Refrain from any intense heavy lifting or straining until you are off narcotics for pain control.  You will have off days, but things should improve week-by-week. DO NOT PUSH THROUGH PAIN.  Let pain be your guide: If it hurts to do something, don't do it.  Pain is your body warning you to avoid that activity for another week until the pain goes down.   May shower / Bathe   Complete by: As directed    May walk up steps   Complete by: As directed    Remove dressing in 48 hours   Complete by: As directed    Make sure all dressings have been removed on the second day after surgery = 6/21 Saturday Leave incisions open to air.  OK to cover incisions with gauze or bandages as desired   Sexual Activity Restrictions   Complete by: As directed    You may have sexual intercourse when it is comfortable. If it hurts to do something, stop.       Allergies as of 06/06/2024       Reactions   Clindamycin /lincomycin Rash   Sulfa Drugs Cross Reactors Itching, Rash        Medication List     TAKE these medications    acetaminophen  500 MG tablet Commonly known as: TYLENOL  Take 1,000 mg by mouth every 6 (six) hours as needed (for pain.).   cetirizine 10 MG tablet Commonly known as: ZYRTEC Take 10 mg by mouth daily as needed for allergies.   clobetasol ointment 0.05 % Commonly known as: TEMOVATE Apply 1 Application topically at bedtime as needed (Apply to palms at bedtime and cover with gloves).   docusate sodium  100 MG capsule Commonly known as: COLACE Take 200 mg by mouth daily.   fluticasone 50 MCG/ACT nasal spray Commonly known as: FLONASE Place 1 spray into both nostrils daily as needed for allergies or rhinitis.   gabapentin  600 MG tablet Commonly known as: NEURONTIN  Take 600 mg by mouth in the morning, at noon, and at bedtime. Morning, afternoon & night   gabapentin  300 MG capsule Commonly known as:  NEURONTIN  Take 300 mg by mouth 3 (three) times daily. Takes w/600 mg capsule to equal 900 mg   glipiZIDE 2.5 MG 24 hr tablet Commonly known as: GLUCOTROL XL Take 2.5 mg by mouth every morning.   HumuLIN R  100 UNIT/ML injection  Generic drug: insulin  regular Inject into the skin 3 (three) times daily before meals. Sliding scale: 1-4 units based on glucose   hydrOXYzine  25 MG tablet Commonly known as: ATARAX  Take 25 mg by mouth at bedtime.   ibuprofen 200 MG tablet Commonly known as: ADVIL Take 400 mg by mouth every 8 (eight) hours as needed (for pain.).   lidocaine -prilocaine  cream Commonly known as: EMLA  Apply 1 Application topically as needed (Apply to port area 1-2 hours, start using 2 weeks after placement).   ondansetron  8 MG tablet Commonly known as: ZOFRAN  Take 1 tablet (8 mg total) by mouth every 8 (eight) hours as needed (Take after 3 days after chemo as needed for nausea).   oxyCODONE  5 MG immediate release tablet Commonly known as: Oxy IR/ROXICODONE  Take 1 tablet (5 mg total) by mouth every 4 (four) hours as needed for severe pain (pain score 7-10) or breakthrough pain. What changed:  how much to take reasons to take this   pantoprazole  40 MG tablet Commonly known as: Protonix  Take 1 tablet (40 mg total) by mouth daily.   polyethylene glycol 17 g packet Commonly known as: MIRALAX  / GLYCOLAX  Take 17 g by mouth in the morning.   prochlorperazine  10 MG tablet Commonly known as: COMPAZINE  Take 1 tablet (10 mg total) by mouth every 6 (six) hours as needed for nausea or vomiting.   rosuvastatin 10 MG tablet Commonly known as: CRESTOR Take 10 mg by mouth every other day.   Semglee (yfgn) 100 UNIT/ML Pen Generic drug: insulin  glargine-yfgn Inject 35 Units into the skin at bedtime.   spironolactone  100 MG tablet Commonly known as: ALDACTONE  Take 100 mg by mouth daily in the afternoon.   triamcinolone  cream 0.5 % Commonly known as: KENALOG  Apply 1  Application topically 2 (two) times daily as needed (skin irritation.). On legs   valACYclovir  500 MG tablet Commonly known as: VALTREX  Take 500 mg by mouth in the morning.   VITAMIN B COMPLEX PO Take 1 each by mouth daily. Gummies               Discharge Care Instructions  (From admission, onward)           Start     Ordered   06/05/24 0000  Discharge wound care:       Comments: It is good for closed incisions and even open wounds to be washed every day.  Shower every day.  Short baths are fine.  Wash the incisions and wounds clean with soap & water .    You may leave closed incisions open to air if it is dry.   You may cover the incision with clean gauze & replace it after your daily shower for comfort.  TEGADERM & STERISTRIPS:  You have clear gauze band-aid dressings over your closed incision(s).  You also have skin tapes called Steristrips on your incisions.  Leave these in place.  You may trim any edges that curl up with clean scissors.  You may remove all dressings & tapes in the shower in 2 days after surgery.   06/05/24 1237            Significant Diagnostic Studies:  Results for orders placed or performed during the hospital encounter of 06/05/24 (from the past 72 hours)  Glucose, capillary     Status: Abnormal   Collection Time: 06/05/24  8:34 AM  Result Value Ref Range   Glucose-Capillary 150 (H) 70 - 99 mg/dL    Comment: Glucose  reference range applies only to samples taken after fasting for at least 8 hours.   Comment 1 Notify RN   Glucose, capillary     Status: Abnormal   Collection Time: 06/05/24 11:45 AM  Result Value Ref Range   Glucose-Capillary 191 (H) 70 - 99 mg/dL    Comment: Glucose reference range applies only to samples taken after fasting for at least 8 hours.  Glucose, capillary     Status: Abnormal   Collection Time: 06/05/24 12:51 PM  Result Value Ref Range   Glucose-Capillary 188 (H) 70 - 99 mg/dL    Comment: Glucose reference range  applies only to samples taken after fasting for at least 8 hours.  Glucose, capillary     Status: Abnormal   Collection Time: 06/05/24  4:34 PM  Result Value Ref Range   Glucose-Capillary 243 (H) 70 - 99 mg/dL    Comment: Glucose reference range applies only to samples taken after fasting for at least 8 hours.  Glucose, capillary     Status: Abnormal   Collection Time: 06/05/24  9:02 PM  Result Value Ref Range   Glucose-Capillary 222 (H) 70 - 99 mg/dL    Comment: Glucose reference range applies only to samples taken after fasting for at least 8 hours.  Glucose, capillary     Status: Abnormal   Collection Time: 06/06/24  7:17 AM  Result Value Ref Range   Glucose-Capillary 130 (H) 70 - 99 mg/dL    Comment: Glucose reference range applies only to samples taken after fasting for at least 8 hours.    No results found.  Past Medical History:  Diagnosis Date   Acne    on face spirolactone for   Anal cancer (HCC) 02/25/2021   Constipation, chronic 08/08/2016   Diabetes mellitus without complication (HCC) 08/2022   Type II   Family history of adverse reaction to anesthesia    Mother has trouble waking takes longer   Herpes simplex    History of migraine    none in years   Hypercholesteremia    Wears glasses     Past Surgical History:  Procedure Laterality Date   ABDOMINAL HYSTERECTOMY  2016   partial   COLONOSCOPY     COLONOSCOPY WITH PROPOFOL  N/A 01/28/2021   Procedure: COLONOSCOPY WITH PROPOFOL ;  Surgeon: Irby Mannan, MD;  Location: [facility];  Service: Endoscopy;  Laterality: N/A;   EXCISION HYDRADENITIS LABIA Left 02/25/2021   Procedure: EXCISION of  LABIAL LESIOM;  Surgeon: Candyce Champagne, MD;  Location: [facility];  Service: General;  Laterality: Left;   INTERCOSTAL NERVE BLOCK  12/29/2022   Procedure: INTERCOSTAL NERVE BLOCK;  Surgeon: Zelphia Higashi, MD;  Location: [facility] OR;  Service: Thoracic;;   IR CV LINE INJECTION  02/01/2024   IR  FLUORO GUIDE CV LINE RIGHT  04/18/2021   IR IMAGING GUIDED PORT INSERTION  10/18/2023   IR PATIENT EVAL TECH 0-60 MINS  01/16/2024   IR PATIENT EVAL TECH 0-60 MINS  01/25/2024   LAPAROTOMY N/A 12/03/2021   Procedure: EXPLORATORY LAPAROTOMY, LYSIS OF ADHESIONS;  Surgeon: Shela Derby, MD;  Location: [facility] OR;  Service: General;  Laterality: N/A;   LYMPH NODE DISSECTION Right 12/29/2022   Procedure: LYMPH NODE DISSECTION;  Surgeon: Zelphia Higashi, MD;  Location: [facility] OR;  Service: Thoracic;  Laterality: Right;   OSTOMY N/A 11/03/2021   Procedure: END COLOSTOMY;  Surgeon: Candyce Champagne, MD;  Location: [facility];  Service: General;  Laterality: N/A;  PECTORALIS FLAP N/A 11/03/2021   Procedure: VERTICAL RECTUS ABDOMINIS MYOCUTANEOUS TO PERINEUM;  Surgeon: Alger Infield, MD;  Location: [facility];  Service: Plastics;  Laterality: N/A;   RECTAL BIOPSY N/A 09/30/2021   Procedure: ANORECTAL MASS BIOPSY;  Surgeon: Candyce Champagne, MD;  Location: [facility];  Service: General;  Laterality: N/A;   RECTAL EXAM UNDER ANESTHESIA N/A 02/25/2021   Procedure: ANORECTAL EXAM UNDER ANESTHESIA;  Surgeon: Candyce Champagne, MD;  Location: [facility];  Service: General;  Laterality: N/A;   RECTAL EXAM UNDER ANESTHESIA N/A 09/30/2021   Procedure: ANORECTAL EXAMINATION UNDER ANESTHESIA;  Surgeon: Candyce Champagne, MD;  Location: [facility];  Service: General;  Laterality: N/A;  GEN & LOCAL ANESTHESIA   SPHINCTEROTOMY N/A 08/30/2016   Procedure: LATERAL INTERNAL AND SPHINCTEROTOMY;  Surgeon: Candyce Champagne, MD;  Location: [facility];  Service: General;  Laterality: N/A;   TONSILLECTOMY  as child   TRANSANAL EXCISION OF RECTAL MASS N/A 02/25/2021   Procedure: TRANSANAL EXCISIONAL  BIOPSY OF ANAL CANAL MASS;  Surgeon: Candyce Champagne, MD;  Location: [facility];  Service: General;  Laterality: N/A;   XI ROBOT ABDOMINAL PERINEAL RESECTION N/A 11/03/2021   Procedure: XI ROBOT ABDOMINOPERINEAL RESECTION;  Surgeon: Candyce Champagne, MD;  Location: [facility];  Service: General;  Laterality: N/A;    Social History   Socioeconomic History   Marital status: Married    Spouse name: Willey Harrier   Number of children: 3   Years of education: Not on file   Highest education level: Not on file  Occupational History   Not on file  Tobacco Use   Smoking status: Former    Current packs/day: 0.00    Average packs/day: 1 pack/day for 8.0 years (8.0 ttl pk-yrs)    Types: Cigarettes    Start date: 02/15/2013    Quit date: 02/15/2021    Years since quitting: 3.3   Smokeless tobacco: Never  Vaping Use   Vaping status: Never Used  Substance and Sexual Activity   Alcohol use: Yes    Alcohol/week: 14.0 standard drinks of alcohol    Types: 14 Shots of liquor per week    Comment: 2 dirnks daily   Drug use: No   Sexual activity: Yes    Birth control/protection: Surgical    Comment: Hysterectomy  Other Topics Concern   Not on file  Social History Narrative   Not on file   Social Drivers of Health   Financial Resource Strain: Not on file  Food Insecurity: No Food Insecurity (06/05/2024)   Hunger Vital Sign    Worried About Running Out of Food in the Last Year: Never true    Ran Out of Food in the Last Year: Never true  Transportation Needs: No Transportation Needs (06/05/2024)   PRAPARE - Administrator, Civil Service (Medical): No    Lack of Transportation (Non-Medical): No  Physical Activity: Inactive (11/12/2017)   Exercise Vital Sign    Days of Exercise per Week: 0 days    Minutes of Exercise per Session: 0 min  Stress: No Stress Concern Present (11/12/2017)   [employer] of Occupational Health - Occupational Stress Questionnaire    Feeling of Stress : Only a little  Social Connections: Somewhat Isolated (11/12/2017)   Social Connection and Isolation Panel    Frequency of Communication with Friends and Family: More than three times a week    Frequency of Social Gatherings with Friends and Family:  Once a week  Attends Religious Services: Never    Active Member of Clubs or Organizations: No    Attends Banker Meetings: Never    Marital Status: Married  Catering manager Violence: Not At Risk (06/05/2024)   Humiliation, Afraid, Rape, and Kick questionnaire    Fear of Current or Ex-Partner: No    Emotionally Abused: No    Physically Abused: No    Sexually Abused: No    Family History  Problem Relation Age of Onset   Breast cancer Paternal Aunt 50       neg GT   Prostate cancer Paternal Uncle        dx unkown age   Skin cancer Maternal Grandfather        face; dx >50   Cancer Other        breast ca in PGM's sister; metastatic prostate cancer in PGM's brother; lung cancer in PGM's sister   Breast cancer Cousin        paternal female cousin; dx before age 46   Cancer Paternal Great-grandfather        liver; PGM's father   Cancer Paternal Great-grandmother        unk GYN; PGM's mother    Current Facility-Administered Medications  Medication Dose Route Frequency Provider Last Rate Last Admin   acetaminophen  (TYLENOL ) tablet 1,000 mg  1,000 mg Oral Q6H Shalayne Leach, MD   1,000 mg at 06/06/24 6433   bisacodyl  (DULCOLAX) suppository 10 mg  10 mg Rectal Daily PRN Candyce Champagne, MD       diphenhydrAMINE  (BENADRYL ) 12.5 MG/5ML elixir 12.5 mg  12.5 mg Oral Q6H PRN Candyce Champagne, MD       Or   diphenhydrAMINE  (BENADRYL ) injection 12.5 mg  12.5 mg Intravenous Q6H PRN Candyce Champagne, MD       docusate sodium  (COLACE) capsule 200 mg  200 mg Oral Daily Candyce Champagne, MD   200 mg at 06/05/24 1501   enoxaparin  (LOVENOX ) injection 40 mg  40 mg Subcutaneous Q24H Candyce Champagne, MD       fluticasone (FLONASE) 50 MCG/ACT nasal spray 1 spray  1 spray Each Nare Daily PRN Candyce Champagne, MD       gabapentin  (NEURONTIN ) capsule 900 mg  900 mg Oral TID Candyce Champagne, MD   900 mg at 06/05/24 2055   glipiZIDE (GLUCOTROL XL) 24 hr tablet 2.5 mg  2.5 mg Oral Q breakfast Candyce Champagne, MD        HYDROmorphone  (DILAUDID ) injection 0.5-2 mg  0.5-2 mg Intravenous Q4H PRN Candyce Champagne, MD       hydrOXYzine  (ATARAX ) tablet 25 mg  25 mg Oral QHS Valeree Leidy, MD   25 mg at 06/05/24 2055   ibuprofen (ADVIL) tablet 400 mg  400 mg Oral Q8H PRN Candyce Champagne, MD       insulin  aspart (novoLOG ) injection 0-15 Units  0-15 Units Subcutaneous TID WC Candyce Champagne, MD   5 Units at 06/05/24 1657   insulin  aspart (novoLOG ) injection 0-5 Units  0-5 Units Subcutaneous QHS Deeandra Jerry, MD   2 Units at 06/05/24 2104   insulin  glargine-yfgn (SEMGLEE) injection 35 Units  35 Units Subcutaneous QHS Jauan Wohl, MD   35 Units at 06/05/24 2056   lactated ringers  infusion   Intravenous Q8H PRN Candyce Champagne, MD       lidocaine -prilocaine  (EMLA ) cream 1 Application  1 Application Topical PRN Candyce Champagne, MD       loratadine  (CLARITIN ) tablet 10 mg  10 mg  Oral Daily Candyce Champagne, MD       magic mouthwash  15 mL Oral QID PRN Candyce Champagne, MD       magnesium hydroxide (MILK OF MAGNESIA) suspension 30 mL  30 mL Oral Daily PRN Candyce Champagne, MD       menthol -cetylpyridinium (CEPACOL) lozenge 3 mg  1 lozenge Oral PRN Candyce Champagne, MD       methocarbamol  (ROBAXIN ) tablet 500 mg  500 mg Oral Q6H PRN Candyce Champagne, MD       metoprolol  tartrate (LOPRESSOR ) injection 5 mg  5 mg Intravenous Q6H PRN Candyce Champagne, MD       naphazoline-glycerin (CLEAR EYES REDNESS) ophth solution 1-2 drop  1-2 drop Both Eyes QID PRN Candyce Champagne, MD       ondansetron  (ZOFRAN -ODT) disintegrating tablet 4 mg  4 mg Oral Q6H PRN Candyce Champagne, MD       Or   ondansetron  (ZOFRAN ) injection 4 mg  4 mg Intravenous Q6H PRN Candyce Champagne, MD       oxyCODONE  (Oxy IR/ROXICODONE ) immediate release tablet 5-10 mg  5-10 mg Oral Q4H PRN Candyce Champagne, MD   10 mg at 06/06/24 1478   pantoprazole  (PROTONIX ) EC tablet 40 mg  40 mg Oral Daily Candyce Champagne, MD       phenol (CHLORASEPTIC) mouth spray 2 spray  2 spray Mouth/Throat PRN Candyce Champagne, MD        polyethylene glycol (MIRALAX  / GLYCOLAX ) packet 17 g  17 g Oral Daily Candyce Champagne, MD       prochlorperazine  (COMPAZINE ) tablet 10 mg  10 mg Oral Q6H PRN Candyce Champagne, MD       rosuvastatin (CRESTOR) tablet 10 mg  10 mg Oral QODAY Jonta Gastineau, MD       simethicone  (MYLICON) chewable tablet 40 mg  40 mg Oral Q6H PRN Candyce Champagne, MD       sodium chloride  (OCEAN) 0.65 % nasal spray 1-2 spray  1-2 spray Each Nare Q6H PRN Candyce Champagne, MD       spironolactone  (ALDACTONE ) tablet 100 mg  100 mg Oral Q1500 Candyce Champagne, MD   100 mg at 06/05/24 1502   traMADol  (ULTRAM ) tablet 50-100 mg  50-100 mg Oral Q6H PRN Candyce Champagne, MD       valACYclovir  (VALTREX ) tablet 500 mg  500 mg Oral Daily Candyce Champagne, MD   500 mg at 06/05/24 1501   Facility-Administered Medications Ordered in Other Encounters  Medication Dose Route Frequency Provider Last Rate Last Admin   0.9 %  sodium chloride  infusion   Intravenous Continuous Sumner Ends, MD   Stopped at 12/21/23 1531   heparin  lock flush 100 unit/mL  500 Units Intracatheter Once PRN Sumner Ends, MD       sodium chloride  flush (NS) 0.9 % injection 10 mL  10 mL Intracatheter PRN Sumner Ends, MD       sodium chloride  flush (NS) 0.9 % injection 10 mL  10 mL Intravenous PRN Sumner Ends, MD   10 mL at 05/29/24 1443     Allergies  Allergen Reactions   Clindamycin /Lincomycin Rash   Sulfa Drugs Cross Reactors Itching and Rash    Signed:   Eddye Goodie, MD, FACS, MASCRS Esophageal, Gastrointestinal & Colorectal Surgery Robotic and Minimally Invasive Surgery  [facility] [address], [address] [place], [place] [address] [phone] Fax [phone] Main  CONTACT INFORMATION: Weekday (9AM-5PM): Call  [facility] main office at [phone] Weeknight (5PM-9AM) or Weekend/Holiday: Check EPIC Web Links tab & use AMION (password  [email or web address]) for General Surgery CCS  coverage  Please, DO NOT use SecureChat  (it is not reliable communication to reach operating surgeons & will lead to a delay in care).   Epic staff messaging available for outptient concerns needing 1-2 business day response.      06/06/2024, 7:51 AM

## 2024-06-09 ENCOUNTER — Ambulatory Visit: Payer: Self-pay | Admitting: Surgery

## 2024-06-09 LAB — SURGICAL PATHOLOGY

## 2024-06-14 ENCOUNTER — Other Ambulatory Visit: Payer: Self-pay

## 2024-06-25 ENCOUNTER — Inpatient Hospital Stay

## 2024-06-25 ENCOUNTER — Inpatient Hospital Stay: Attending: Oncology | Admitting: Nurse Practitioner

## 2024-06-25 ENCOUNTER — Encounter: Payer: Self-pay | Admitting: Nurse Practitioner

## 2024-06-25 ENCOUNTER — Inpatient Hospital Stay: Attending: Oncology

## 2024-06-25 DIAGNOSIS — C210 Malignant neoplasm of anus, unspecified: Secondary | ICD-10-CM

## 2024-06-25 DIAGNOSIS — R748 Abnormal levels of other serum enzymes: Secondary | ICD-10-CM | POA: Diagnosis not present

## 2024-06-25 DIAGNOSIS — Z720 Tobacco use: Secondary | ICD-10-CM | POA: Diagnosis not present

## 2024-06-25 DIAGNOSIS — R109 Unspecified abdominal pain: Secondary | ICD-10-CM | POA: Insufficient documentation

## 2024-06-25 DIAGNOSIS — C211 Malignant neoplasm of anal canal: Secondary | ICD-10-CM | POA: Insufficient documentation

## 2024-06-25 DIAGNOSIS — L309 Dermatitis, unspecified: Secondary | ICD-10-CM | POA: Insufficient documentation

## 2024-06-25 DIAGNOSIS — Z95828 Presence of other vascular implants and grafts: Secondary | ICD-10-CM

## 2024-06-25 LAB — CMP (CANCER CENTER ONLY)
ALT: 92 U/L — ABNORMAL HIGH (ref 0–44)
AST: 66 U/L — ABNORMAL HIGH (ref 15–41)
Albumin: 4.3 g/dL (ref 3.5–5.0)
Alkaline Phosphatase: 95 U/L (ref 38–126)
Anion gap: 14 (ref 5–15)
BUN: 11 mg/dL (ref 6–20)
CO2: 23 mmol/L (ref 22–32)
Calcium: 9.5 mg/dL (ref 8.9–10.3)
Chloride: 101 mmol/L (ref 98–111)
Creatinine: 0.56 mg/dL (ref 0.44–1.00)
GFR, Estimated: >60 mL/min (ref >60)
Glucose, Bld: 178 mg/dL — ABNORMAL HIGH (ref 70–99)
Potassium: 4 mmol/L (ref 3.5–5.1)
Sodium: 138 mmol/L (ref 135–145)
Total Bilirubin: 0.4 mg/dL (ref 0.0–1.2)
Total Protein: 6.8 g/dL (ref 6.5–8.1)

## 2024-06-25 LAB — CBC WITH DIFFERENTIAL (CANCER CENTER ONLY)
Abs Immature Granulocytes: 0.01 K/uL (ref 0.00–0.07)
Basophils Absolute: 0 K/uL (ref 0.0–0.1)
Basophils Relative: 1 %
Eosinophils Absolute: 0.2 K/uL (ref 0.0–0.5)
Eosinophils Relative: 6 %
HCT: 37.1 % (ref 36.0–46.0)
Hemoglobin: 12.4 g/dL (ref 12.0–15.0)
Immature Granulocytes: 0 %
Lymphocytes Relative: 12 %
Lymphs Abs: 0.5 K/uL — ABNORMAL LOW (ref 0.7–4.0)
MCH: 32.7 pg (ref 26.0–34.0)
MCHC: 33.4 g/dL (ref 30.0–36.0)
MCV: 97.9 fL (ref 80.0–100.0)
Monocytes Absolute: 0.5 K/uL (ref 0.1–1.0)
Monocytes Relative: 12 %
Neutro Abs: 3 K/uL (ref 1.7–7.7)
Neutrophils Relative %: 69 %
Platelet Count: 118 K/uL — ABNORMAL LOW (ref 150–400)
RBC: 3.79 MIL/uL — ABNORMAL LOW (ref 3.87–5.11)
RDW: 12.3 % (ref 11.5–15.5)
WBC Count: 4.4 K/uL (ref 4.0–10.5)
nRBC: 0 % (ref 0.0–0.2)

## 2024-06-25 MED ORDER — OXYCODONE HCL 5 MG PO TABS: 5.0000 mg | ORAL_TABLET | ORAL | 0 refills | Status: DC | PRN

## 2024-06-25 MED ORDER — SODIUM CHLORIDE 0.9% FLUSH
10.0000 mL | INTRAVENOUS | Status: AC | PRN
  Administered 2024-06-25: 10 mL via INTRAVENOUS

## 2024-06-25 MED ORDER — HEPARIN SOD (PORK) LOCK FLUSH 100 UNIT/ML IV SOLN
500.0000 [IU] | Freq: Once | INTRAVENOUS | Status: AC
  Administered 2024-06-25: 500 [IU] via INTRAVENOUS

## 2024-06-25 NOTE — Addendum Note (Signed)
 Addended by: Jin Shockley S on: 06/25/2024 12:09 PM   Modules accepted: Orders

## 2024-06-25 NOTE — Progress Notes (Unsigned)
 [facility] OFFICE PROGRESS NOTE   Diagnosis: Anal cancer  INTERVAL HISTORY:   Beth [name] returns for follow-up.  She underwent excision of the abdominal wall mass, abdominal wall reconstruction with mesh 06/05/2024.  Operative findings included concrete hard fibrotic 5 x 4 x 4 cm mass mostly subfascial pushing into the peritoneum, excised with 2 cm margin circumferentially.  Second ellipsoid 2 x 2 x 1 cm mass along the right flank.  Pathology on the abdominal wall mass showed carcinoma with basaloid features consistent with metastasis from known anal carcinoma; carcinoma focally less than 0.1 cm from inked margin of the specimen.  Pathology on the right flank nodule showed adipose tissue with fat necrosis, negative for malignancy.  Right side abdominal pain is slowly improving.  She estimates taking 1 oxycodone  4-5 times a day.  She occasionally will take 2.  No constipation.  No diarrhea.  No nausea or vomiting.  Objective:  Vital signs in last 24 hours:  Blood pressure 124/89, pulse 91, temperature 97.9 F (36.6 C), temperature source Temporal, resp. rate 18, height 5' 7 (1.702 m), weight 174 lb 8 oz (79.2 kg), last menstrual period 03/13/2012, SpO2 100%.    Resp: Lungs clear bilaterally. Cardio: Regular rate and rhythm. GI: Tender throughout the right upper abdomen.  Healed right abdomen surgical incision. Vascular: No leg edema. Skin: No rash. Port-A-Cath without erythema.  Lab Results:  Lab Results  Component Value Date   WBC 4.4 06/25/2024   HGB 12.4 06/25/2024   HCT 37.1 06/25/2024   MCV 97.9 06/25/2024   PLT 118 (L) 06/25/2024   NEUTROABS 3.0 06/25/2024    Imaging:  No results found.  Medications: I have reviewed the patient's current medications.  Assessment/Plan: Squamous cell carcinoma of the anal canal Colonoscopy 01/28/2021-rectal mass extending to the outside, palpable on exam-biopsy revealed high-grade squamous intraepithelial lesion  (AIN 3/carcinoma in situ) Exam under anesthesia with transanal excision of an anal canal mass 02/25/2021-frozen section consistent with squamous cell carcinoma, 4 x 4 centimeter sessile anal canal mass extending to the anal verge, right lateral and posterior, 30% circumferential, fixed to the anal sphincter complex; anal rectal mass with mixed high-grade neuroendocrine squamous cell carcinoma; posterior anal tag with squamous cell carcinoma; left labial lesion-condyloma acuminata CTs 03/07/2021-ill-definition of tissue planes along the anus with abnormal soft tissue prominence posteriorly along the anal canal suspicious for mass.  Small adjacent lower perirectal lymph nodes in addition to a 0.5 cm sacral node.  Focal enhancing lesion in the right hepatic lobe 1.2 x 0.8 x 0.9 cm.  2.1 x 1.5 cm cystic lesion right ovary with thin enhancing rim. PET scan 03/16/2021-hypermetabolic mass within the anal canal.  No definite evidence of local or distant metastatic disease.  7 mm left submandibular lymph node with mild FDG uptake, likely reactive.  Focal site of FDG uptake within the inner quadrant of the right breast. Radiation 03/21/2021-04/27/2021 Cycle one 5-FU/mitomycin -C 03/21/2021 Cycle two 5-FU/Mitomycin -C 04/18/2021, 5-FU dose reduced secondary to mucositis Anoscopy by Dr. Sheldon 07/20/2021-divet in the right posterior lateral aspect with some right anterior and right lateral folds consistent with radiation treatment.  No ulceration.  Not fully resolved but markedly shrunk down. Anorectal mass 09/30/2021 examination under anesthesia, biopsy-poorly differentiated carcinoma-basaloid squamous carcinoma and high-grade neuroendocrine carcinoma, margins positive APR/and vertical rectus abdominis flap 11/03/2021, invasive poorly differentiated carcinoma, negative resection margins, 0/10 nodes, treatment response score 2,ypT1,ypN0 CT abdomen/pelvis 12/03/2021-high-grade small bowel obstruction transitioning in the left lower  abdomen.  Presacral rim-enhancing  4.4 x 2.8 x 3.8 cm fluid collection.  No air in the collection.  Prior study described a potential abnormality in the right lobe of the liver, not seen on current study.  Cystic lesion of the right ovary resolved. 12/04/2021: Lysis of adhesions secondary to internal hernia CT chest 12/07/2021-interval development of bilateral trace pleural effusions. CTs 12/04/2022-new 2.6 x 2.5 cm right lower lobe nodule PET 12/21/2022-FDG avid right lower lobe nodule, no other evidence of metastatic disease 12/29/2022 right lower lobectomy, lymph node dissection-poorly differentiated carcinoma consistent with metastasis, 4.8 x 2.9 x 2.6 cm, margins free, 7 lymph nodes negative for carcinoma; cytohistomorphology correlation supports the current tumor being a metastasis from the previously diagnosed anal carcinoma CT chest 04/17/2023-interval right lower lobectomy with resection of right lung mass, pleural thickening and small right pleural effusion, no new mass or lymph node enlargement CTs 07/20/2023-diminished postoperative right pleural effusion.  Unchanged postoperative findings status post abdominoperineal resection with left lower quadrant and colostomy.  No evidence of metastatic disease. CT abdomen/pelvis 08/31/2023-heterogenous soft tissue nodule in the right upper quadrant anterior abdominal wall, no evidence of metastatic disease CT/ultrasound guided biopsy of right abdominal wall mass 09/07/2023-metastatic neoplasm consistent with a metastasis from anal cancer, Foundation 1-HRD signature negative, MSS, tumor mutation burden 4, BRCA 1 alteration, PD-L1 TPS 10% Cycle 1 day 1 Taxol //carboplatin  10/19/2023, day 8 Taxol  10/26/2023, day 15 Taxol  held 11/02/2023 due to neutropenia Cycle 2-day 1 Taxol /carboplatin  plus Retifanlimab  11/14/2023, Taxol  and carboplatin  dose reduced due to previous neutropenia, mild thrombocytopenia; day 8 Taxol  11/22/2023, day 15 Taxol  11/29/2023 Cycle 3-day 1  Taxol /carboplatin  plus retifanlimab  12/14/2023, day 8 Taxol  12/21/2023, day 15 Taxol  12/28/2023 CT abdomen/pelvis 01/09/2024: Decreased right anterior abdominal wall nodule, no evidence of disease progression Cycle 4-day 1 Taxol /carboplatin /retifanlimab  01/11/2024; day 8 Taxol  01/18/2024 (given peripherally), day 15 Taxol  01/25/2024 (given peripherally) Cycle 5-day 1 Taxol /carboplatin /retifanlimab  02/08/2024, day 8 Taxol  02/13/2024, day 15 Taxol  02/22/2024 Cycle 6-day 1 carboplatin /retifanlimib 03/07/2024, paclitaxel  placed on hold secondary to nail toxicity CT 03/14/2024: Stable right anterior abdominal wall nodule, no evidence of progressive metastatic disease, stable small nodes in the upper retroperitoneum and mediastinum Treatment continued with monthly single agent retifanlimib CT abdomen/pelvis 05/09/2024-interval growth of soft tissue mass right anterior abdominal wall. Treatment held 05/29/2024 Excision of right abdominal wall mass, excision of abdominal wall mass, abdominal wall reconstruction with mesh 06/05/2024-pathology on the right subcostal abdominal wall mass-carcinoma with basaloid features consistent with metastasis from the known anal carcinoma, carcinoma focally less than 0.1 cm from the inked margin of the specimen; right flank nodule-nodule of adipose tissue with fat necrosis, negative for malignancy.   Remote history of an anal fissure repair Tobacco use Moderate alcohol use Eczema Right breast mass- PET scan 03/16/2021 with focal FDG activity in the inner right breast Mammogram and right breast ultrasound 03/28/2021- negative Bilateral breast MRI 03/31/2021- 7 mm mass in the upper inner right breast correlating with the PET findings, no other abnormality, no adenopathy MRI guided biopsy of right breast mass 04/11/2021- fibrocystic change with sclerosing adenosis, small focus of inflammation/fibrosis suggestive of resolving fat necrosis, no evidence of malignancy--repeat bilateral breast MRI at  a 39-month interval; bilateral breast MRI on 10/13/2021-no evidence of breast malignancy.  Annual screening mammography recommended.   7.  Admission with small bowel obstruction 12/03/2021 status post exploratory laparotomy and lysis of adhesions 8.  Right abdominal pain/tenderness-potentially related to the right abdominal wall mass noted on CT 08/31/2023 9.  Elevated liver enzymes-chronic, etiology unclear 10.  Hematuria-evaluated by  urology 09/11/2023, cystoscopy with radiation and infection changes, no tumor seen 11.  Right neck/chest tenderness 12/21/2023-negative Doppler study.  Recurrent symptoms 01/15/2024.  Started on a course of Augmentin .  Referred for Port-A-Cath study 01/18/2024-study completed 02/01/2024, intact tunneled right internal jugular vein Port-A-Cath with catheter tip located just below the SVC/RA junction.  No evidence of catheter rupture, contrast extravasation or fibrin sheath. Port-A-Cath dye study 02/01/2024: Normal 12.  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  Left great toenail removed by podiatry 04/09/2024.    Disposition: Beth [name] appears stable.  She is slowly recovering from the recent surgery.  We reviewed the pathology report.  Her case will be presented at the upcoming conference, discuss close surgical margin.  Systemic therapy remains on hold.  She continues to have significant right-sided abdominal pain.  A new prescription for oxycodone  was sent to her pharmacy.    She will return for follow-up in approximately 2 weeks.  Patient seen with Dr. Cloretta.    Beth [name] ANP/GNP-BC   06/25/2024  10:30 AM This was a shared visit with Beth [name].  Beth [name] was interviewed and examined.  She underwent resection of the abdominal wall mass on 06/05/2024.  The pathology confirmed metastatic anal cancer.  She continues to have pain at the right abdomen and in the left posterior pelvis/ischial region.  Reviewed  recent CT images.  I cannot appreciate a metastatic a soft tissue or bone metastasis involving the pelvis.  We discussed treatment options with Beth [name].  I recommend discontinuing the retifanlimab  since the abdominal wall mass enlarged while on treatment. We will present her case at the GI tumor conference to discuss the indication for radiation to the abdominal wall.  I recommend observation for now.  I was present for greater than 50% of today's visit.  I performed medical decision making.  Arvella Cloretta, MD

## 2024-06-26 ENCOUNTER — Encounter: Payer: Self-pay | Admitting: Oncology

## 2024-06-27 ENCOUNTER — Other Ambulatory Visit: Payer: Self-pay

## 2024-07-02 ENCOUNTER — Other Ambulatory Visit: Payer: Self-pay

## 2024-07-02 NOTE — Progress Notes (Signed)
 The proposed treatment discussed in conference is for discussion purpose only and is not a binding recommendation.  The patients have not been physically examined, or presented with their treatment options.  Therefore, final treatment plans cannot be decided.

## 2024-07-03 ENCOUNTER — Ambulatory Visit (INDEPENDENT_AMBULATORY_CARE_PROVIDER_SITE_OTHER): Admitting: Podiatry

## 2024-07-03 VITALS — Ht 67.0 in | Wt 174.5 lb

## 2024-07-03 DIAGNOSIS — R52 Pain, unspecified: Secondary | ICD-10-CM

## 2024-07-03 DIAGNOSIS — L600 Ingrowing nail: Secondary | ICD-10-CM

## 2024-07-03 NOTE — Patient Instructions (Signed)

## 2024-07-04 ENCOUNTER — Telehealth: Payer: Self-pay | Admitting: *Deleted

## 2024-07-04 NOTE — Telephone Encounter (Signed)
 Patient called ask for outcome of her case review at GI Conference.

## 2024-07-07 ENCOUNTER — Encounter: Payer: Self-pay | Admitting: *Deleted

## 2024-07-07 NOTE — Progress Notes (Signed)
 Subjective: Chief Complaint  Patient presents with   Nail Problem    Rm 14  Patient is here for a possible ingrown toe nail of the right hallus. Nail is thickened and discolored. Patient states right hallux has a throbbing pain for the last three weeks.   53 year old female presents the office today with concerns on her right big toenail, which is new.  She is the left side was removed but on the right side is doing the same thing the left side is doing.  She has discomfort on the toenail starting to race.  No injury that she reports.  No drainage or pus.  Objective: AAO x3, NAD DP/PT pulses palpable bilaterally, CRT less than 3 seconds Right hallux toenail is dystrophic and is loose on the nail bed.  There is mild incurvation present.  Slight clear drainage expressed.  There is no frank purulence.  No ascending cellulitis. No pain with calf compression, swelling, warmth, erythema  Assessment: 53 year old female with right hallux onychodystrophy, ingrown toenail  Plan: -All treatment options discussed with the patient including all alternatives, risks, complications.  -At this time, patient agreed to proceed with total nail removal without chemical matricectomy to the right hallux to to infection. Risks and complications were discussed with the patient for which they understand and  verbally consent to the procedure. Under sterile conditions a total of 3 mL of a mixture of 2% lidocaine  plain and 0.5% Marcaine  plain was infiltrated in a hallux block fashion. Once anesthetized, the skin was prepped in sterile fashion. A tourniquet was then applied. Next the right hallux nail was removed in total and ensuring that all nail borders.  Once the nail was removed, the area was debrided and the underlying skin was intact. The area was irrigated and hemostasis was obtained.  A dry sterile dressing was applied. After application of the dressing the tourniquet was removed and there is found to be an immediate  capillary refill time to the digit. The patient tolerated the procedure well any complications. Post procedure instructions were discussed the patient for which he verbally understood. Follow-up in one week for nail check or sooner if any problems are to arise. Discussed signs/symptoms of worsening infection and directed to call the office immediately should any occur or go directly to the emergency room. In the meantime, encouraged to call the office with any questions, concerns, changes symptoms. -Mupirocin  ointment.  -Patient encouraged to call the office with any questions, concerns, change in symptoms.   Return in about 2 weeks (around 07/17/2024), or if symptoms worsen or fail to improve, for nail check .  Beth [name] DPM

## 2024-07-08 ENCOUNTER — Other Ambulatory Visit: Payer: Self-pay | Admitting: Nurse Practitioner

## 2024-07-08 DIAGNOSIS — C210 Malignant neoplasm of anus, unspecified: Secondary | ICD-10-CM

## 2024-07-10 ENCOUNTER — Encounter: Payer: Self-pay | Admitting: Nurse Practitioner

## 2024-07-10 ENCOUNTER — Inpatient Hospital Stay (HOSPITAL_BASED_OUTPATIENT_CLINIC_OR_DEPARTMENT_OTHER): Admitting: Nurse Practitioner

## 2024-07-10 VITALS — BP 125/87 | HR 100 | Temp 97.9°F | Resp 18 | Ht 67.0 in | Wt 176.6 lb

## 2024-07-10 DIAGNOSIS — C210 Malignant neoplasm of anus, unspecified: Secondary | ICD-10-CM | POA: Diagnosis not present

## 2024-07-10 DIAGNOSIS — C211 Malignant neoplasm of anal canal: Secondary | ICD-10-CM | POA: Diagnosis not present

## 2024-07-10 MED ORDER — OXYCODONE HCL 5 MG PO TABS: 5.0000 mg | ORAL_TABLET | ORAL | 0 refills | Status: DC | PRN

## 2024-07-10 NOTE — Progress Notes (Signed)
 [facility] OFFICE PROGRESS NOTE   Diagnosis: Anal cancer  INTERVAL HISTORY:   Ms. Herbert returns for follow-up.  She continues to have pain at the right abdomen.  She reports Dr. Sheldon recently prescribed naproxen  and cyclobenzaprine .  She has not picked up from her pharmacy yet.  She continues oxycodone  as needed.  Bowels are moving.  No nausea or vomiting.  Objective:  Vital signs in last 24 hours:  Blood pressure 125/87, pulse 100, temperature 97.9 F (36.6 C), temperature source Temporal, resp. rate 18, height 5' 7 (1.702 m), weight 176 lb 9.6 oz (80.1 kg), last menstrual period 03/13/2012, SpO2 100%.    Lymphatics: No palpable cervical, supraclavicular, axillary or inguinal lymph nodes. Resp: Lungs clear bilaterally. Cardio: Regular rate and rhythm. GI: Healed right abdomen surgical incision.  Tender throughout the right upper abdomen.  No hepatomegaly.  Left lower quadrant colostomy. Vascular: No leg edema. Port-A-Cath without erythema.  Lab Results:  Lab Results  Component Value Date   WBC 4.4 06/25/2024   HGB 12.4 06/25/2024   HCT 37.1 06/25/2024   MCV 97.9 06/25/2024   PLT 118 (L) 06/25/2024   NEUTROABS 3.0 06/25/2024    Imaging:  No results found.  Medications: I have reviewed the patient's current medications.  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  Small adjacent lower perirectal lymph nodes in addition to a 0.5 cm sacral node.  Focal enhancing lesion in the right hepatic lobe 1.2 x 0.8 x 0.9 cm.  2.1 x 1.5 cm cystic lesion right ovary with thin enhancing rim. PET scan 03/16/2021-hypermetabolic mass within the anal canal.  No definite evidence of local or distant metastatic disease.  7 mm left submandibular lymph node with mild FDG uptake, likely reactive.  Focal site of FDG uptake within the inner quadrant of the right breast. Radiation 03/21/2021-04/27/2021 Cycle one 5-FU/mitomycin -C 03/21/2021 Cycle two 5-FU/Mitomycin -C 04/18/2021, 5-FU dose reduced secondary to mucositis Anoscopy by Dr. Sheldon 07/20/2021-divet in the right posterior lateral aspect with some right anterior and right lateral folds consistent with radiation treatment.  No ulceration.  Not fully resolved but markedly shrunk down. Anorectal mass 09/30/2021 examination under anesthesia, biopsy-poorly differentiated carcinoma-basaloid squamous carcinoma and high-grade neuroendocrine carcinoma, margins positive APR/and vertical rectus abdominis flap 11/03/2021, invasive poorly differentiated carcinoma, negative resection margins, 0/10 nodes, treatment response score 2,ypT1,ypN0 CT abdomen/pelvis 12/03/2021-high-grade small bowel obstruction transitioning in the left lower abdomen.  Presacral rim-enhancing 4.4 x 2.8 x 3.8 cm fluid collection.  No air in the collection.  Prior study described a potential abnormality in the right lobe of the liver, not seen on current study.  Cystic lesion of the right ovary resolved. 12/04/2021: Lysis of adhesions secondary to internal hernia CT chest 12/07/2021-interval development of bilateral trace pleural effusions. CTs 12/04/2022-new 2.6 x 2.5 cm right lower lobe  nodule PET 12/21/2022-FDG avid right lower lobe nodule, no other  evidence of metastatic disease 12/29/2022 right lower lobectomy, lymph node dissection-poorly differentiated carcinoma consistent with metastasis, 4.8 x 2.9 x 2.6 cm, margins free, 7 lymph nodes negative for carcinoma; cytohistomorphology correlation supports the current tumor being a metastasis from the previously diagnosed anal carcinoma CT chest 04/17/2023-interval right lower lobectomy with resection of right lung mass, pleural thickening and small right pleural effusion, no new mass or lymph node enlargement CTs 07/20/2023-diminished postoperative right pleural effusion.  Unchanged postoperative findings status post abdominoperineal resection with left lower quadrant and colostomy.  No evidence of metastatic disease. CT abdomen/pelvis 08/31/2023-heterogenous soft tissue nodule in the right upper quadrant anterior abdominal wall, no evidence of metastatic disease CT/ultrasound guided biopsy of right abdominal wall mass 09/07/2023-metastatic neoplasm consistent with a metastasis from anal cancer, Foundation 1-HRD signature negative, MSS, tumor mutation burden 4, BRCA 1 alteration, PD-L1 TPS 10% Cycle 1 day 1 Taxol //carboplatin  10/19/2023, day 8 Taxol  10/26/2023, day 15 Taxol  held 11/02/2023 due to neutropenia Cycle 2-day 1 Taxol /carboplatin  plus Retifanlimab  11/14/2023, Taxol  and carboplatin  dose reduced due to previous neutropenia, mild thrombocytopenia; day 8 Taxol  11/22/2023, day 15 Taxol  11/29/2023 Cycle 3-day 1 Taxol /carboplatin  plus retifanlimab  12/14/2023, day 8 Taxol  12/21/2023, day 15 Taxol  12/28/2023 CT abdomen/pelvis 01/09/2024: Decreased right anterior abdominal wall nodule, no evidence of disease progression Cycle 4-day 1 Taxol /carboplatin /retifanlimab  01/11/2024; day 8 Taxol  01/18/2024 (given peripherally), day 15 Taxol  01/25/2024 (given peripherally) Cycle 5-day 1 Taxol /carboplatin /retifanlimab  02/08/2024, day 8 Taxol  02/13/2024, day 15 Taxol   02/22/2024 Cycle 6-day 1 carboplatin /retifanlimib 03/07/2024, paclitaxel  placed on hold secondary to nail toxicity CT 03/14/2024: Stable right anterior abdominal wall nodule, no evidence of progressive metastatic disease, stable small nodes in the upper retroperitoneum and mediastinum Treatment continued with monthly single agent retifanlimib CT abdomen/pelvis 05/09/2024-interval growth of soft tissue mass right anterior abdominal wall. Treatment held 05/29/2024 Excision of right abdominal wall mass, excision of abdominal wall mass, abdominal wall reconstruction with mesh 06/05/2024-pathology on the right subcostal abdominal wall mass-carcinoma with basaloid features consistent with metastasis from the known anal carcinoma, carcinoma focally less than 0.1 cm from the inked margin of the specimen; right flank nodule-nodule of adipose tissue with fat necrosis, negative for malignancy. Tumor board 07/02/2024-observation recommended   Remote history of an anal fissure repair Tobacco use Moderate alcohol use Eczema Right breast mass- PET scan 03/16/2021 with focal FDG activity in the inner right breast Mammogram and right breast ultrasound 03/28/2021- negative Bilateral breast MRI 03/31/2021- 7 mm mass in the upper inner right breast correlating with the PET findings, no other abnormality, no adenopathy MRI guided biopsy of right breast mass 04/11/2021- fibrocystic change with sclerosing adenosis, small focus of inflammation/fibrosis suggestive of resolving fat necrosis, no evidence of malignancy--repeat bilateral breast MRI at a 1-month interval; bilateral breast MRI on 10/13/2021-no evidence of breast malignancy.  Annual screening mammography recommended.   7.  Admission with small bowel obstruction 12/03/2021 status post exploratory laparotomy and lysis of adhesions 8.  Right abdominal pain/tenderness-potentially related to the right abdominal wall mass noted on CT 08/31/2023 9.  Elevated liver enzymes-chronic,  etiology unclear 10.  Hematuria-evaluated by urology 09/11/2023, cystoscopy with radiation and infection changes, no tumor seen 11.  Right neck/chest tenderness 12/21/2023-negative Doppler study.  Recurrent symptoms 01/15/2024.  Started on a course of Augmentin .  Referred for Port-A-Cath study 01/18/2024-study completed 02/01/2024, intact tunneled right internal jugular vein Port-A-Cath with catheter tip located just below the SVC/RA junction.  No evidence of catheter rupture, contrast extravasation or fibrin sheath. Port-A-Cath dye study 02/01/2024: Normal 12.  Chemotherapy  nail toxicity-likely secondary to paclitaxel , paclitaxel  placed on hold 03/07/2024.  Left great toenail removed by podiatry 04/09/2024.    Disposition: Ms. Ranes appears stable.  She is slowly recovering from the recent surgery.  We reviewed the tumor board recommendation for observation.  Plan for CTs in about 4 weeks (this will be an approximate 56-month interval from the previous).  She agrees with the above.  She continues to have right sided abdominal pain.  Oxycodone  refill sent to her pharmacy.  She will return for follow-up on 08/15/2024.  We are available to see her sooner if needed.    Olam Ned ANP/GNP-BC   07/10/2024  2:06 PM

## 2024-07-11 ENCOUNTER — Other Ambulatory Visit: Payer: Self-pay

## 2024-07-15 ENCOUNTER — Encounter: Payer: Self-pay | Admitting: Oncology

## 2024-07-16 ENCOUNTER — Other Ambulatory Visit: Payer: Self-pay

## 2024-07-19 ENCOUNTER — Other Ambulatory Visit: Payer: Self-pay

## 2024-07-30 ENCOUNTER — Other Ambulatory Visit: Payer: Self-pay | Admitting: Nurse Practitioner

## 2024-07-30 DIAGNOSIS — C210 Malignant neoplasm of anus, unspecified: Secondary | ICD-10-CM

## 2024-08-08 ENCOUNTER — Ambulatory Visit
Admission: RE | Admit: 2024-08-08 | Discharge: 2024-08-08 | Disposition: A | Discharge status: Home / Self Care | Source: Ambulatory Visit | Attending: Nurse Practitioner | Admitting: Nurse Practitioner

## 2024-08-08 DIAGNOSIS — C210 Malignant neoplasm of anus, unspecified: Secondary | ICD-10-CM

## 2024-08-08 MED ORDER — IOPAMIDOL (ISOVUE-300) INJECTION 61%
100.0000 mL | Freq: Once | INTRAVENOUS | Status: AC | PRN
  Administered 2024-08-08: 100 mL via INTRAVENOUS

## 2024-08-14 ENCOUNTER — Encounter: Payer: Self-pay | Admitting: Podiatry

## 2024-08-14 ENCOUNTER — Other Ambulatory Visit: Payer: Self-pay | Admitting: Podiatry

## 2024-08-14 MED ORDER — CICLOPIROX 8 % EX SOLN
Freq: Every day | CUTANEOUS | 2 refills | Status: DC
Start: 2024-08-14 — End: 2024-11-12

## 2024-08-15 ENCOUNTER — Inpatient Hospital Stay: Attending: Oncology

## 2024-08-15 ENCOUNTER — Encounter: Payer: Self-pay | Admitting: Oncology

## 2024-08-15 ENCOUNTER — Inpatient Hospital Stay: Admitting: Oncology

## 2024-08-15 ENCOUNTER — Inpatient Hospital Stay

## 2024-08-15 ENCOUNTER — Ambulatory Visit: Payer: Self-pay | Admitting: Oncology

## 2024-08-15 DIAGNOSIS — C210 Malignant neoplasm of anus, unspecified: Secondary | ICD-10-CM

## 2024-08-15 DIAGNOSIS — C211 Malignant neoplasm of anal canal: Secondary | ICD-10-CM | POA: Diagnosis present

## 2024-08-15 DIAGNOSIS — L309 Dermatitis, unspecified: Secondary | ICD-10-CM | POA: Diagnosis not present

## 2024-08-15 DIAGNOSIS — R748 Abnormal levels of other serum enzymes: Secondary | ICD-10-CM | POA: Insufficient documentation

## 2024-08-15 DIAGNOSIS — Z720 Tobacco use: Secondary | ICD-10-CM | POA: Diagnosis not present

## 2024-08-15 DIAGNOSIS — F1090 Alcohol use, unspecified, uncomplicated: Secondary | ICD-10-CM | POA: Insufficient documentation

## 2024-08-15 LAB — CMP (CANCER CENTER ONLY)
ALT: 160 U/L — ABNORMAL HIGH (ref 0–44)
AST: 80 U/L — ABNORMAL HIGH (ref 15–41)
Albumin: 4.7 g/dL (ref 3.5–5.0)
Alkaline Phosphatase: 118 U/L (ref 38–126)
Anion gap: 14 (ref 5–15)
BUN: 12 mg/dL (ref 6–20)
CO2: 22 mmol/L (ref 22–32)
Calcium: 10.1 mg/dL (ref 8.9–10.3)
Chloride: 102 mmol/L (ref 98–111)
Creatinine: 0.57 mg/dL (ref 0.44–1.00)
GFR, Estimated: >60 mL/min (ref >60)
Glucose, Bld: 154 mg/dL — ABNORMAL HIGH (ref 70–99)
Potassium: 4.4 mmol/L (ref 3.5–5.1)
Sodium: 137 mmol/L (ref 135–145)
Total Bilirubin: 0.6 mg/dL (ref 0.0–1.2)
Total Protein: 7.5 g/dL (ref 6.5–8.1)

## 2024-08-15 LAB — CBC WITH DIFFERENTIAL (CANCER CENTER ONLY)
Abs Immature Granulocytes: 0.04 K/uL (ref 0.00–0.07)
Basophils Absolute: 0.1 K/uL (ref 0.0–0.1)
Basophils Relative: 1 %
Eosinophils Absolute: 0.3 K/uL (ref 0.0–0.5)
Eosinophils Relative: 5 %
HCT: 40.6 % (ref 36.0–46.0)
Hemoglobin: 13.9 g/dL (ref 12.0–15.0)
Immature Granulocytes: 1 %
Lymphocytes Relative: 12 %
Lymphs Abs: 0.7 K/uL (ref 0.7–4.0)
MCH: 33.5 pg (ref 26.0–34.0)
MCHC: 34.2 g/dL (ref 30.0–36.0)
MCV: 97.8 fL (ref 80.0–100.0)
Monocytes Absolute: 0.7 K/uL (ref 0.1–1.0)
Monocytes Relative: 12 %
Neutro Abs: 4 K/uL (ref 1.7–7.7)
Neutrophils Relative %: 69 %
Platelet Count: 150 K/uL (ref 150–400)
RBC: 4.15 MIL/uL (ref 3.87–5.11)
RDW: 13.2 % (ref 11.5–15.5)
WBC Count: 5.8 K/uL (ref 4.0–10.5)
nRBC: 0 % (ref 0.0–0.2)

## 2024-08-15 MED ORDER — OXYCODONE HCL 5 MG PO TABS: 2.5000 mg | ORAL_TABLET | ORAL | 0 refills | Status: DC | PRN

## 2024-08-15 NOTE — Patient Instructions (Signed)

## 2024-08-15 NOTE — Progress Notes (Signed)
 [facility] OFFICE PROGRESS NOTE   Diagnosis: Anal cancer  INTERVAL HISTORY:   Beth [name] returns as scheduled.  She reports improvement in the right abdominal pain.  She is now taking naproxen  twice daily and a muscle relaxant at night.  She has weaned the use of oxycodone .  She is working.  The nails are improving.  Objective:  Vital signs in last 24 hours:  Blood pressure (!) 132/92, pulse 95, temperature 97.9 F (36.6 C), temperature source Temporal, resp. rate 18, height 5' 7 (1.702 m), weight 173 lb 6.4 oz (78.7 kg), last menstrual period 03/13/2012, SpO2 100%.    Lymphatics: No cervical, supraclavicular, axillary, or inguinal nodes Resp: Lungs clear bilaterally Cardio: Regular rate and rhythm GI: No hepatosplenomegaly, mild tenderness in the right upper abdomen, no mass, left lower quadrant colostomy Vascular: No leg edema  Skin: No drainage at the fingernails, new nail growth  Portacath/PICC-without erythema  Lab Results:  Lab Results  Component Value Date   WBC 5.8 08/15/2024   HGB 13.9 08/15/2024   HCT 40.6 08/15/2024   MCV 97.8 08/15/2024   PLT 150 08/15/2024   NEUTROABS 4.0 08/15/2024    CMP  Lab Results  Component Value Date   NA 137 08/15/2024   K 4.4 08/15/2024   CL 102 08/15/2024   CO2 22 08/15/2024   GLUCOSE 154 (H) 08/15/2024   BUN 12 08/15/2024   CREATININE 0.57 08/15/2024   CALCIUM  10.1 08/15/2024   PROT 7.5 08/15/2024   ALBUMIN  4.7 08/15/2024   AST 80 (H) 08/15/2024   ALT 160 (H) 08/15/2024   ALKPHOS 118 08/15/2024   BILITOT 0.6 08/15/2024   GFRNONAA >60 08/15/2024   GFRAA 106 11/29/2020    Lab Results  Component Value Date   CEA 1.39 10/10/2023     Medications: I have reviewed the patient's current medications.   Assessment/Plan: Squamous cell carcinoma of the anal canal Colonoscopy 01/28/2021-rectal mass extending to the outside, palpable on exam-biopsy revealed high-grade squamous intraepithelial lesion  (AIN 3/carcinoma in situ) Exam under anesthesia with transanal excision of an anal canal mass 02/25/2021-frozen section consistent with squamous cell carcinoma, 4 x 4 centimeter sessile anal canal mass extending to the anal verge, right lateral and posterior, 30% circumferential, fixed to the anal sphincter complex; anal rectal mass with mixed high-grade neuroendocrine squamous cell carcinoma; posterior anal tag with squamous cell carcinoma; left labial lesion-condyloma acuminata CTs 03/07/2021-ill-definition of tissue planes along the anus with abnormal soft tissue prominence posteriorly along the anal canal suspicious for mass.  Small adjacent lower perirectal lymph nodes in addition to a 0.5 cm sacral node.  Focal enhancing lesion in the right hepatic lobe 1.2 x 0.8 x 0.9 cm.  2.1 x 1.5 cm cystic lesion right ovary with thin enhancing rim. PET scan 03/16/2021-hypermetabolic mass within the anal canal.  No definite evidence of local or distant metastatic disease.  7 mm left submandibular lymph node with mild FDG uptake, likely reactive.  Focal site of FDG uptake within the inner quadrant of the right breast. Radiation 03/21/2021-04/27/2021 Cycle one 5-FU/mitomycin -C 03/21/2021 Cycle two 5-FU/Mitomycin -C 04/18/2021, 5-FU dose reduced secondary to mucositis Anoscopy by Dr. Sheldon 07/20/2021-divet in the right posterior lateral aspect with some right anterior and right lateral folds consistent with radiation treatment.  No ulceration.  Not fully resolved but markedly shrunk down. Anorectal mass 09/30/2021 examination under anesthesia, biopsy-poorly differentiated carcinoma-basaloid squamous carcinoma and high-grade neuroendocrine carcinoma, margins positive APR/and vertical rectus abdominis flap 11/03/2021, invasive poorly differentiated carcinoma,  negative resection margins, 0/10 nodes, treatment response score 2,ypT1,ypN0 CT abdomen/pelvis 12/03/2021-high-grade small bowel obstruction transitioning in the left lower  abdomen.  Presacral rim-enhancing 4.4 x 2.8 x 3.8 cm fluid collection.  No air in the collection.  Prior study described a potential abnormality in the right lobe of the liver, not seen on current study.  Cystic lesion of the right ovary resolved. 12/04/2021: Lysis of adhesions secondary to internal hernia CT chest 12/07/2021-interval development of bilateral trace pleural effusions. CTs 12/04/2022-new 2.6 x 2.5 cm right lower lobe nodule PET 12/21/2022-FDG avid right lower lobe nodule, no other evidence of metastatic disease 12/29/2022 right lower lobectomy, lymph node dissection-poorly differentiated carcinoma consistent with metastasis, 4.8 x 2.9 x 2.6 cm, margins free, 7 lymph nodes negative for carcinoma; cytohistomorphology correlation supports the current tumor being a metastasis from the previously diagnosed anal carcinoma CT chest 04/17/2023-interval right lower lobectomy with resection of right lung mass, pleural thickening and small right pleural effusion, no new mass or lymph node enlargement CTs 07/20/2023-diminished postoperative right pleural effusion.  Unchanged postoperative findings status post abdominoperineal resection with left lower quadrant and colostomy.  No evidence of metastatic disease. CT abdomen/pelvis 08/31/2023-heterogenous soft tissue nodule in the right upper quadrant anterior abdominal wall, no evidence of metastatic disease CT/ultrasound guided biopsy of right abdominal wall mass 09/07/2023-metastatic neoplasm consistent with a metastasis from anal cancer, Foundation 1-HRD signature negative, MSS, tumor mutation burden 4, BRCA 1 alteration, PD-L1 TPS 10% Cycle 1 day 1 Taxol //carboplatin  10/19/2023, day 8 Taxol  10/26/2023, day 15 Taxol  held 11/02/2023 due to neutropenia Cycle 2-day 1 Taxol /carboplatin  plus Retifanlimab  11/14/2023, Taxol  and carboplatin  dose reduced due to previous neutropenia, mild thrombocytopenia; day 8 Taxol  11/22/2023, day 15 Taxol  11/29/2023 Cycle 3-day 1  Taxol /carboplatin  plus retifanlimab  12/14/2023, day 8 Taxol  12/21/2023, day 15 Taxol  12/28/2023 CT abdomen/pelvis 01/09/2024: Decreased right anterior abdominal wall nodule, no evidence of disease progression Cycle 4-day 1 Taxol /carboplatin /retifanlimab  01/11/2024; day 8 Taxol  01/18/2024 (given peripherally), day 15 Taxol  01/25/2024 (given peripherally) Cycle 5-day 1 Taxol /carboplatin /retifanlimab  02/08/2024, day 8 Taxol  02/13/2024, day 15 Taxol  02/22/2024 Cycle 6-day 1 carboplatin /retifanlimib 03/07/2024, paclitaxel  placed on hold secondary to nail toxicity CT 03/14/2024: Stable right anterior abdominal wall nodule, no evidence of progressive metastatic disease, stable small nodes in the upper retroperitoneum and mediastinum Treatment continued with monthly single agent retifanlimib CT abdomen/pelvis 05/09/2024-interval growth of soft tissue mass right anterior abdominal wall. Treatment held 05/29/2024 Excision of right abdominal wall mass, excision of abdominal wall mass, abdominal wall reconstruction with mesh 06/05/2024-pathology on the right subcostal abdominal wall mass-carcinoma with basaloid features consistent with metastasis from the known anal carcinoma, carcinoma focally less than 0.1 cm from the inked margin of the specimen; right flank nodule-nodule of adipose tissue with fat necrosis, negative for malignancy. Tumor board 07/02/2024-observation recommended 08/08/2024 CTs: Seen soft tissue nodule at the ventral right abdomen has been resected with adjacent postoperative change, unchanged postoperative appearance status post right lower lobectomy, unchanged 0.4 cm peripheral left lower lobe nodule-nonspecific, unchanged prominent upper abdominal lymph nodes-nonspecific   Remote history of an anal fissure repair Tobacco use Moderate alcohol use Eczema Right breast mass- PET scan 03/16/2021 with focal FDG activity in the inner right breast Mammogram and right breast ultrasound 03/28/2021- negative Bilateral  breast MRI 03/31/2021- 7 mm mass in the upper inner right breast correlating with the PET findings, no other abnormality, no adenopathy MRI guided biopsy of right breast mass 04/11/2021- fibrocystic change with sclerosing adenosis, small focus of inflammation/fibrosis suggestive of resolving fat necrosis, no  evidence of malignancy--repeat bilateral breast MRI at a 33-month interval; bilateral breast MRI on 10/13/2021-no evidence of breast malignancy.  Annual screening mammography recommended.   7.  Admission with small bowel obstruction 12/03/2021 status post exploratory laparotomy and lysis of adhesions 8.  Right abdominal pain/tenderness-potentially related to the right abdominal wall mass noted on CT 08/31/2023 9.  Elevated liver enzymes-chronic, etiology unclear 10.  Hematuria-evaluated by urology 09/11/2023, cystoscopy with radiation and infection changes, no tumor seen 11.  Right neck/chest tenderness 12/21/2023-negative Doppler study.  Recurrent symptoms 01/15/2024.  Started on a course of Augmentin .  Referred for Port-A-Cath study 01/18/2024-study completed 02/01/2024, intact tunneled right internal jugular vein Port-A-Cath with catheter tip located just below the SVC/RA junction.  No evidence of catheter rupture, contrast extravasation or fibrin sheath. Port-A-Cath dye study 02/01/2024: Normal 12.  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  Left great toenail removed by podiatry 04/09/2024.      Disposition: Beth [name] has a history of metastatic anal cancer.  She is in clinical remission and without evidence of measurable disease following resection of the right abdominal wall mass.  I reviewed the restaging CT findings and images with her.  The plan is to continue observation.  A Port-A-Cath remains in place.  She will return for an office visit and Port-A-Cath flush in 6 weeks. She is weaning oxycodone  as tolerated.  The pain at the right abdomen  has improved.  Arley Hof, MD  08/15/2024  8:58 AM

## 2024-08-15 NOTE — Telephone Encounter (Signed)
-----   Message from Arley Hof sent at 08/15/2024 11:20 AM EDT ----- Add CMP next visit ----- Message ----- From: Debby Olam POUR, NP Sent: 08/15/2024  10:36 AM EDT To: Arley KATHEE Hof, MD   ----- Message ----- From: Rebecka, Lab [facility] [place] Sent: 08/15/2024   8:24 AM EDT To: Olam POUR Debby, NP

## 2024-08-16 ENCOUNTER — Other Ambulatory Visit: Payer: Self-pay

## 2024-09-12 ENCOUNTER — Other Ambulatory Visit: Payer: Self-pay | Admitting: Podiatry

## 2024-09-12 MED ORDER — EFINACONAZOLE 10 % EX SOLN: 1.0000 [drp] | Freq: Every day | CUTANEOUS | 11 refills | Status: DC

## 2024-09-16 ENCOUNTER — Other Ambulatory Visit: Payer: Self-pay | Admitting: Nurse Practitioner

## 2024-09-16 ENCOUNTER — Telehealth: Payer: Self-pay

## 2024-09-16 DIAGNOSIS — C210 Malignant neoplasm of anus, unspecified: Secondary | ICD-10-CM

## 2024-09-16 MED ORDER — OXYCODONE HCL 5 MG PO TABS: 2.5000 mg | ORAL_TABLET | ORAL | 0 refills | Status: DC | PRN

## 2024-09-16 NOTE — Telephone Encounter (Signed)
 The patient called to request a refill of her pain medication. I have submitted the request to the nurse practitioner's desk.

## 2024-09-18 NOTE — Telephone Encounter (Signed)
 Patient contacted. Will do formula 7. Beth [name] spoke to the patient

## 2024-10-04 ENCOUNTER — Other Ambulatory Visit: Payer: Self-pay | Admitting: Oncology

## 2024-10-04 DIAGNOSIS — C210 Malignant neoplasm of anus, unspecified: Secondary | ICD-10-CM

## 2024-10-10 ENCOUNTER — Inpatient Hospital Stay

## 2024-10-10 ENCOUNTER — Inpatient Hospital Stay: Attending: Oncology | Admitting: Oncology

## 2024-10-10 VITALS — BP 127/100 | HR 87 | Temp 98.1°F | Resp 18 | Ht 67.0 in | Wt 177.1 lb

## 2024-10-10 DIAGNOSIS — L309 Dermatitis, unspecified: Secondary | ICD-10-CM | POA: Diagnosis not present

## 2024-10-10 DIAGNOSIS — M791 Myalgia, unspecified site: Secondary | ICD-10-CM | POA: Diagnosis present

## 2024-10-10 DIAGNOSIS — Z933 Colostomy status: Secondary | ICD-10-CM | POA: Insufficient documentation

## 2024-10-10 DIAGNOSIS — Z85048 Personal history of other malignant neoplasm of rectum, rectosigmoid junction, and anus: Secondary | ICD-10-CM | POA: Insufficient documentation

## 2024-10-10 DIAGNOSIS — Z720 Tobacco use: Secondary | ICD-10-CM | POA: Diagnosis not present

## 2024-10-10 DIAGNOSIS — R200 Anesthesia of skin: Secondary | ICD-10-CM | POA: Insufficient documentation

## 2024-10-10 DIAGNOSIS — C210 Malignant neoplasm of anus, unspecified: Secondary | ICD-10-CM | POA: Diagnosis not present

## 2024-10-10 DIAGNOSIS — F1090 Alcohol use, unspecified, uncomplicated: Secondary | ICD-10-CM | POA: Insufficient documentation

## 2024-10-10 DIAGNOSIS — R748 Abnormal levels of other serum enzymes: Secondary | ICD-10-CM | POA: Insufficient documentation

## 2024-10-10 LAB — CBC WITH DIFFERENTIAL (CANCER CENTER ONLY)
Abs Immature Granulocytes: 0.03 K/uL (ref 0.00–0.07)
Basophils Absolute: 0 K/uL (ref 0.0–0.1)
Basophils Relative: 0 %
Eosinophils Absolute: 0.5 K/uL (ref 0.0–0.5)
Eosinophils Relative: 7 %
HCT: 40.1 % (ref 36.0–46.0)
Hemoglobin: 13.6 g/dL (ref 12.0–15.0)
Immature Granulocytes: 0 %
Lymphocytes Relative: 12 %
Lymphs Abs: 0.9 K/uL (ref 0.7–4.0)
MCH: 33.7 pg (ref 26.0–34.0)
MCHC: 33.9 g/dL (ref 30.0–36.0)
MCV: 99.3 fL (ref 80.0–100.0)
Monocytes Absolute: 0.7 K/uL (ref 0.1–1.0)
Monocytes Relative: 10 %
Neutro Abs: 5 K/uL (ref 1.7–7.7)
Neutrophils Relative %: 71 %
Platelet Count: 123 K/uL — ABNORMAL LOW (ref 150–400)
RBC: 4.04 MIL/uL (ref 3.87–5.11)
RDW: 12.8 % (ref 11.5–15.5)
WBC Count: 7.1 K/uL (ref 4.0–10.5)
nRBC: 0 % (ref 0.0–0.2)

## 2024-10-10 LAB — CMP (CANCER CENTER ONLY)
ALT: 142 U/L — ABNORMAL HIGH (ref 0–44)
AST: 78 U/L — ABNORMAL HIGH (ref 15–41)
Albumin: 4.5 g/dL (ref 3.5–5.0)
Alkaline Phosphatase: 112 U/L (ref 38–126)
Anion gap: 9 (ref 5–15)
BUN: 12 mg/dL (ref 6–20)
CO2: 26 mmol/L (ref 22–32)
Calcium: 9.9 mg/dL (ref 8.9–10.3)
Chloride: 101 mmol/L (ref 98–111)
Creatinine: 0.57 mg/dL (ref 0.44–1.00)
GFR, Estimated: >60 mL/min (ref >60)
Glucose, Bld: 167 mg/dL — ABNORMAL HIGH (ref 70–99)
Potassium: 4.2 mmol/L (ref 3.5–5.1)
Sodium: 136 mmol/L (ref 135–145)
Total Bilirubin: 0.3 mg/dL (ref 0.0–1.2)
Total Protein: 7.1 g/dL (ref 6.5–8.1)

## 2024-10-10 MED ORDER — OXYCODONE HCL 5 MG PO TABS: 2.5000 mg | ORAL_TABLET | Freq: Four times a day (QID) | ORAL | 0 refills | Status: DC | PRN

## 2024-10-10 NOTE — Progress Notes (Signed)
 [facility] OFFICE PROGRESS NOTE   Diagnosis: Anal cancer  INTERVAL HISTORY:   Beth [name] returns as scheduled.  She continues to have soreness at the right upper abdomen.  Her chief complaint is pain at the left buttock.  The pain is constant.  No back pain.  She has numbness in the left 1st, 2nd and 3rd toes and right first toe.  She takes 1 oxycodone  tablet per day.  The colostomy is functioning well.  Good appetite.  She is working.  Objective:  Vital signs in last 24 hours:  Blood pressure (!) 127/100, pulse 87, temperature 98.1 F (36.7 C), temperature source Temporal, resp. rate 18, height 5' 7 (1.702 m), weight 177 lb 1.6 oz (80.3 kg), last menstrual period 03/13/2012, SpO2 99%.    Lymphatics: No cervical, supraclavicular, axillary, or inguinal nodes Resp: Lungs clear bilaterally Cardio: Regular rate and rhythm GI: Left lower quadrant colostomy, tender in the right upper abdomen, no mass, no hepatosplenomegaly Vascular: No leg edema Neuro: Motor exam appears intact in the legs and feet bilaterally Skin: Nailbed changes at the fingers and toes have resolved, perineal scar without evidence of recurrent tumor Musculoskeletal: No tenderness at the left iliac or ischium, examination of the left buttock is unremarkable, no pain with motion at the left hip Portacath/PICC-without erythema  Lab Results:  Lab Results  Component Value Date   WBC 7.1 10/10/2024   HGB 13.6 10/10/2024   HCT 40.1 10/10/2024   MCV 99.3 10/10/2024   PLT 123 (L) 10/10/2024   NEUTROABS 5.0 10/10/2024    CMP  Lab Results  Component Value Date   NA 136 10/10/2024   K 4.2 10/10/2024   CL 101 10/10/2024   CO2 26 10/10/2024   GLUCOSE 167 (H) 10/10/2024   BUN 12 10/10/2024   CREATININE 0.57 10/10/2024   CALCIUM  9.9 10/10/2024   PROT 7.1 10/10/2024   ALBUMIN  4.5 10/10/2024   AST 78 (H) 10/10/2024   ALT 142 (H) 10/10/2024   ALKPHOS 112 10/10/2024   BILITOT 0.3 10/10/2024    GFRNONAA >60 10/10/2024   GFRAA 106 11/29/2020    Lab Results  Component Value Date   CEA 1.39 10/10/2023    Lab Results  Component Value Date   INR 1.1 09/07/2023   LABPROT 14.1 09/07/2023    Imaging:  No results found.  Medications: I have reviewed the patient's current medications.   Assessment/Plan: Squamous cell carcinoma of the anal canal Colonoscopy 01/28/2021-rectal mass extending to the outside, palpable on exam-biopsy revealed high-grade squamous intraepithelial lesion (AIN 3/carcinoma in situ) Exam under anesthesia with transanal excision of an anal canal mass 02/25/2021-frozen section consistent with squamous cell carcinoma, 4 x 4 centimeter sessile anal canal mass extending to the anal verge, right lateral and posterior, 30% circumferential, fixed to the anal sphincter complex; anal rectal mass with mixed high-grade neuroendocrine squamous cell carcinoma; posterior anal tag with squamous cell carcinoma; left labial lesion-condyloma acuminata CTs 03/07/2021-ill-definition of tissue planes along the anus with abnormal soft tissue prominence posteriorly along the anal canal suspicious for mass.  Small adjacent lower perirectal lymph nodes in addition to a 0.5 cm sacral node.  Focal enhancing lesion in the right hepatic lobe 1.2 x 0.8 x 0.9 cm.  2.1 x 1.5 cm cystic lesion right ovary with thin enhancing rim. PET scan 03/16/2021-hypermetabolic mass within the anal canal.  No definite evidence of local or distant metastatic disease.  7 mm left submandibular lymph node with mild FDG uptake, likely  reactive.  Focal site of FDG uptake within the inner quadrant of the right breast. Radiation 03/21/2021-04/27/2021 Cycle one 5-FU/mitomycin -C 03/21/2021 Cycle two 5-FU/Mitomycin -C 04/18/2021, 5-FU dose reduced secondary to mucositis Anoscopy by Dr. Sheldon 07/20/2021-divet in the right posterior lateral aspect with some right anterior and right lateral folds consistent with radiation treatment.  No  ulceration.  Not fully resolved but markedly shrunk down. Anorectal mass 09/30/2021 examination under anesthesia, biopsy-poorly differentiated carcinoma-basaloid squamous carcinoma and high-grade neuroendocrine carcinoma, margins positive APR/and vertical rectus abdominis flap 11/03/2021, invasive poorly differentiated carcinoma, negative resection margins, 0/10 nodes, treatment response score 2,ypT1,ypN0 CT abdomen/pelvis 12/03/2021-high-grade small bowel obstruction transitioning in the left lower abdomen.  Presacral rim-enhancing 4.4 x 2.8 x 3.8 cm fluid collection.  No air in the collection.  Prior study described a potential abnormality in the right lobe of the liver, not seen on current study.  Cystic lesion of the right ovary resolved. 12/04/2021: Lysis of adhesions secondary to internal hernia CT chest 12/07/2021-interval development of bilateral trace pleural effusions. CTs 12/04/2022-new 2.6 x 2.5 cm right lower lobe nodule PET 12/21/2022-FDG avid right lower lobe nodule, no other evidence of metastatic disease 12/29/2022 right lower lobectomy, lymph node dissection-poorly differentiated carcinoma consistent with metastasis, 4.8 x 2.9 x 2.6 cm, margins free, 7 lymph nodes negative for carcinoma; cytohistomorphology correlation supports the current tumor being a metastasis from the previously diagnosed anal carcinoma CT chest 04/17/2023-interval right lower lobectomy with resection of right lung mass, pleural thickening and small right pleural effusion, no new mass or lymph node enlargement CTs 07/20/2023-diminished postoperative right pleural effusion.  Unchanged postoperative findings status post abdominoperineal resection with left lower quadrant and colostomy.  No evidence of metastatic disease. CT abdomen/pelvis 08/31/2023-heterogenous soft tissue nodule in the right upper quadrant anterior abdominal wall, no evidence of metastatic disease CT/ultrasound guided biopsy of right abdominal wall mass  09/07/2023-metastatic neoplasm consistent with a metastasis from anal cancer, Foundation 1-HRD signature negative, MSS, tumor mutation burden 4, BRCA 1 alteration, PD-L1 TPS 10% Cycle 1 day 1 Taxol //carboplatin  10/19/2023, day 8 Taxol  10/26/2023, day 15 Taxol  held 11/02/2023 due to neutropenia Cycle 2-day 1 Taxol /carboplatin  plus Retifanlimab  11/14/2023, Taxol  and carboplatin  dose reduced due to previous neutropenia, mild thrombocytopenia; day 8 Taxol  11/22/2023, day 15 Taxol  11/29/2023 Cycle 3-day 1 Taxol /carboplatin  plus retifanlimab  12/14/2023, day 8 Taxol  12/21/2023, day 15 Taxol  12/28/2023 CT abdomen/pelvis 01/09/2024: Decreased right anterior abdominal wall nodule, no evidence of disease progression Cycle 4-day 1 Taxol /carboplatin /retifanlimab  01/11/2024; day 8 Taxol  01/18/2024 (given peripherally), day 15 Taxol  01/25/2024 (given peripherally) Cycle 5-day 1 Taxol /carboplatin /retifanlimab  02/08/2024, day 8 Taxol  02/13/2024, day 15 Taxol  02/22/2024 Cycle 6-day 1 carboplatin /retifanlimib 03/07/2024, paclitaxel  placed on hold secondary to nail toxicity CT 03/14/2024: Stable right anterior abdominal wall nodule, no evidence of progressive metastatic disease, stable small nodes in the upper retroperitoneum and mediastinum Treatment continued with monthly single agent retifanlimib CT abdomen/pelvis 05/09/2024-interval growth of soft tissue mass right anterior abdominal wall. Treatment held 05/29/2024 Excision of right abdominal wall mass, excision of abdominal wall mass, abdominal wall reconstruction with mesh 06/05/2024-pathology on the right subcostal abdominal wall mass-carcinoma with basaloid features consistent with metastasis from the known anal carcinoma, carcinoma focally less than 0.1 cm from the inked margin of the specimen; right flank nodule-nodule of adipose tissue with fat necrosis, negative for malignancy. Tumor board 07/02/2024-observation recommended 08/08/2024 CTs: Seen soft tissue nodule at the ventral  right abdomen has been resected with adjacent postoperative change, unchanged postoperative appearance status post right lower lobectomy, unchanged 0.4 cm peripheral left  lower lobe nodule-nonspecific, unchanged prominent upper abdominal lymph nodes-nonspecific   Remote history of an anal fissure repair Tobacco use Moderate alcohol use Eczema Right breast mass- PET scan 03/16/2021 with focal FDG activity in the inner right breast Mammogram and right breast ultrasound 03/28/2021- negative Bilateral breast MRI 03/31/2021- 7 mm mass in the upper inner right breast correlating with the PET findings, no other abnormality, no adenopathy MRI guided biopsy of right breast mass 04/11/2021- fibrocystic change with sclerosing adenosis, small focus of inflammation/fibrosis suggestive of resolving fat necrosis, no evidence of malignancy--repeat bilateral breast MRI at a 35-month interval; bilateral breast MRI on 10/13/2021-no evidence of breast malignancy.  Annual screening mammography recommended.   7.  Admission with small bowel obstruction 12/03/2021 status post exploratory laparotomy and lysis of adhesions 8.  Right abdominal pain/tenderness-potentially related to the right abdominal wall mass noted on CT 08/31/2023 9.  Elevated liver enzymes-chronic, etiology unclear 10.  Hematuria-evaluated by urology 09/11/2023, cystoscopy with radiation and infection changes, no tumor seen 11.  Right neck/chest tenderness 12/21/2023-negative Doppler study.  Recurrent symptoms 01/15/2024.  Started on a course of Augmentin .  Referred for Port-A-Cath study 01/18/2024-study completed 02/01/2024, intact tunneled right internal jugular vein Port-A-Cath with catheter tip located just below the SVC/RA junction.  No evidence of catheter rupture, contrast extravasation or fibrin sheath. Port-A-Cath dye study 02/01/2024: Normal 12.  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  Left great  toenail removed by podiatry 04/09/2024.       Disposition: Beth [name] is in clinical remission from anal cancer.  The toe numbness may be related to chemotherapy, diabetic neuropathy, or another etiology.  We will make a neurology referral if the numbness persist.  Her chief complaint is discomfort at the left buttock.  She reports constant pain.  Physical examination is unremarkable today.  She will be referred for a restaging PET scan prior to an office visit next month.  I refilled her prescription for oxycodone .  I recommended she monitor her blood pressure.  She says the blood pressure is normal at home and when she sees her primary provider.  Arley Hof, MD  10/10/2024  9:14 AM

## 2024-10-12 ENCOUNTER — Other Ambulatory Visit: Payer: Self-pay

## 2024-10-22 ENCOUNTER — Other Ambulatory Visit: Payer: Self-pay

## 2024-10-22 ENCOUNTER — Other Ambulatory Visit (HOSPITAL_BASED_OUTPATIENT_CLINIC_OR_DEPARTMENT_OTHER): Payer: Self-pay

## 2024-10-22 DIAGNOSIS — C210 Malignant neoplasm of anus, unspecified: Secondary | ICD-10-CM

## 2024-10-22 MED ORDER — ONDANSETRON HCL 8 MG PO TABS
8.0000 mg | ORAL_TABLET | Freq: Three times a day (TID) | ORAL | 2 refills | Status: AC | PRN
Start: 2024-10-22 — End: ?

## 2024-10-22 MED ORDER — PROCHLORPERAZINE MALEATE 10 MG PO TABS
10.0000 mg | ORAL_TABLET | Freq: Four times a day (QID) | ORAL | 2 refills | Status: AC | PRN
Start: 2024-10-22 — End: ?

## 2024-10-22 MED ORDER — ONDANSETRON HCL 8 MG PO TABS
8.0000 mg | ORAL_TABLET | Freq: Three times a day (TID) | ORAL | 2 refills | Status: DC | PRN
  Filled 2024-10-22: qty 20, 7d supply, fill #0

## 2024-11-07 ENCOUNTER — Encounter (HOSPITAL_COMMUNITY)
Admission: RE | Admit: 2024-11-07 | Discharge: 2024-11-07 | Disposition: A | Discharge status: Home / Self Care | Source: Ambulatory Visit | Attending: Oncology | Admitting: Oncology

## 2024-11-07 DIAGNOSIS — R52 Pain, unspecified: Secondary | ICD-10-CM | POA: Insufficient documentation

## 2024-11-07 DIAGNOSIS — C7989 Secondary malignant neoplasm of other specified sites: Secondary | ICD-10-CM | POA: Diagnosis not present

## 2024-11-07 DIAGNOSIS — C210 Malignant neoplasm of anus, unspecified: Secondary | ICD-10-CM | POA: Insufficient documentation

## 2024-11-07 LAB — GLUCOSE, CAPILLARY: Glucose-Capillary: 155 mg/dL — ABNORMAL HIGH (ref 70–99)

## 2024-11-07 MED ORDER — FLUDEOXYGLUCOSE F - 18 (FDG) INJECTION
8.8100 | Freq: Once | INTRAVENOUS | Status: AC
  Administered 2024-11-07: 8.81 via INTRAVENOUS

## 2024-11-07 MED ORDER — HEPARIN SOD (PORK) LOCK FLUSH 100 UNIT/ML IV SOLN
500.0000 [IU] | Freq: Once | INTRAVENOUS | Status: AC
  Administered 2024-11-07: 500 [IU] via INTRAVENOUS
  Filled 2024-11-07: qty 5

## 2024-11-12 ENCOUNTER — Inpatient Hospital Stay (HOSPITAL_BASED_OUTPATIENT_CLINIC_OR_DEPARTMENT_OTHER): Admitting: Oncology

## 2024-11-12 ENCOUNTER — Telehealth: Payer: Self-pay | Admitting: *Deleted

## 2024-11-12 ENCOUNTER — Inpatient Hospital Stay: Attending: Oncology

## 2024-11-12 DIAGNOSIS — C211 Malignant neoplasm of anal canal: Secondary | ICD-10-CM | POA: Insufficient documentation

## 2024-11-12 DIAGNOSIS — R220 Localized swelling, mass and lump, head: Secondary | ICD-10-CM | POA: Insufficient documentation

## 2024-11-12 DIAGNOSIS — Z720 Tobacco use: Secondary | ICD-10-CM | POA: Diagnosis not present

## 2024-11-12 DIAGNOSIS — R222 Localized swelling, mass and lump, trunk: Secondary | ICD-10-CM | POA: Diagnosis not present

## 2024-11-12 DIAGNOSIS — R200 Anesthesia of skin: Secondary | ICD-10-CM | POA: Diagnosis not present

## 2024-11-12 DIAGNOSIS — C210 Malignant neoplasm of anus, unspecified: Secondary | ICD-10-CM

## 2024-11-12 MED ORDER — OXYCODONE HCL 5 MG PO TABS: 2.5000 mg | ORAL_TABLET | ORAL | 0 refills | Status: DC | PRN

## 2024-11-12 NOTE — Progress Notes (Signed)
 [facility] OFFICE PROGRESS NOTE   Diagnosis: Anal cancer  INTERVAL HISTORY:   Beth [name] returns as scheduled.  She continues to have pain at the left buttock.  The pain has progressed.  No right sided pain.  The pain is relieved with oxycodone .  She takes approximately 2 oxycodone  tablets per day.  She has noted a nodule at the parietal scalp for the past 2 weeks.  The nodule is tender.  Good appetite.  She continues to have numbness in the feet.  Objective:  Vital signs in last 24 hours:  Blood pressure (!) 134/90, pulse 98, temperature 97.8 F (36.6 C), temperature source Temporal, resp. rate 18, height 5' 7 (1.702 m), weight 177 lb 3.2 oz (80.4 kg), last menstrual period 03/13/2012, SpO2 100%.   Resp: Lungs clear bilaterally Cardio: Regular rate and rhythm GI: Mild tenderness in the right abdomen surrounding the surgical site, no mass, no hepatomegaly Vascular: No leg edema Musculoskeletal: Firm mass on deep palpation at the mid left gluteus, no mass of the right gluteus Skin: 3-4 mm oval cutaneous nodular lesion at the posterior central parietal scalp.  No ulceration.  Portacath/PICC-without erythema  Lab Results:  Lab Results  Component Value Date   WBC 7.1 10/10/2024   HGB 13.6 10/10/2024   HCT 40.1 10/10/2024   MCV 99.3 10/10/2024   PLT 123 (L) 10/10/2024   NEUTROABS 5.0 10/10/2024    CMP  Lab Results  Component Value Date   NA 136 10/10/2024   K 4.2 10/10/2024   CL 101 10/10/2024   CO2 26 10/10/2024   GLUCOSE 167 (H) 10/10/2024   BUN 12 10/10/2024   CREATININE 0.57 10/10/2024   CALCIUM  9.9 10/10/2024   PROT 7.1 10/10/2024   ALBUMIN  4.5 10/10/2024   AST 78 (H) 10/10/2024   ALT 142 (H) 10/10/2024   ALKPHOS 112 10/10/2024   BILITOT 0.3 10/10/2024   GFRNONAA >60 10/10/2024   GFRAA 106 11/29/2020    Lab Results  Component Value Date   CEA 1.39 10/10/2023   Medications: I have reviewed the patient's current  medications.   Assessment/Plan: Squamous cell carcinoma of the anal canal Colonoscopy 01/28/2021-rectal mass extending to the outside, palpable on exam-biopsy revealed high-grade squamous intraepithelial lesion (AIN 3/carcinoma in situ) Exam under anesthesia with transanal excision of an anal canal mass 02/25/2021-frozen section consistent with squamous cell carcinoma, 4 x 4 centimeter sessile anal canal mass extending to the anal verge, right lateral and posterior, 30% circumferential, fixed to the anal sphincter complex; anal rectal mass with mixed high-grade neuroendocrine squamous cell carcinoma; posterior anal tag with squamous cell carcinoma; left labial lesion-condyloma acuminata CTs 03/07/2021-ill-definition of tissue planes along the anus with abnormal soft tissue prominence posteriorly along the anal canal suspicious for mass.  Small adjacent lower perirectal lymph nodes in addition to a 0.5 cm sacral node.  Focal enhancing lesion in the right hepatic lobe 1.2 x 0.8 x 0.9 cm.  2.1 x 1.5 cm cystic lesion right ovary with thin enhancing rim. PET scan 03/16/2021-hypermetabolic mass within the anal canal.  No definite evidence of local or distant metastatic disease.  7 mm left submandibular lymph node with mild FDG uptake, likely reactive.  Focal site of FDG uptake within the inner quadrant of the right breast. Radiation 03/21/2021-04/27/2021 Cycle one 5-FU/mitomycin -C 03/21/2021 Cycle two 5-FU/Mitomycin -C 04/18/2021, 5-FU dose reduced secondary to mucositis Anoscopy by Dr. Sheldon 07/20/2021-divet in the right posterior lateral aspect with some right anterior and right lateral folds consistent with  radiation treatment.  No ulceration.  Not fully resolved but markedly shrunk down. Anorectal mass 09/30/2021 examination under anesthesia, biopsy-poorly differentiated carcinoma-basaloid squamous carcinoma and high-grade neuroendocrine carcinoma, margins positive APR/and vertical rectus abdominis flap 11/03/2021,  invasive poorly differentiated carcinoma, negative resection margins, 0/10 nodes, treatment response score 2,ypT1,ypN0 CT abdomen/pelvis 12/03/2021-high-grade small bowel obstruction transitioning in the left lower abdomen.  Presacral rim-enhancing 4.4 x 2.8 x 3.8 cm fluid collection.  No air in the collection.  Prior study described a potential abnormality in the right lobe of the liver, not seen on current study.  Cystic lesion of the right ovary resolved. 12/04/2021: Lysis of adhesions secondary to internal hernia CT chest 12/07/2021-interval development of bilateral trace pleural effusions. CTs 12/04/2022-new 2.6 x 2.5 cm right lower lobe nodule PET 12/21/2022-FDG avid right lower lobe nodule, no other evidence of metastatic disease 12/29/2022 right lower lobectomy, lymph node dissection-poorly differentiated carcinoma consistent with metastasis, 4.8 x 2.9 x 2.6 cm, margins free, 7 lymph nodes negative for carcinoma; cytohistomorphology correlation supports the current tumor being a metastasis from the previously diagnosed anal carcinoma CT chest 04/17/2023-interval right lower lobectomy with resection of right lung mass, pleural thickening and small right pleural effusion, no new mass or lymph node enlargement CTs 07/20/2023-diminished postoperative right pleural effusion.  Unchanged postoperative findings status post abdominoperineal resection with left lower quadrant and colostomy.  No evidence of metastatic disease. CT abdomen/pelvis 08/31/2023-heterogenous soft tissue nodule in the right upper quadrant anterior abdominal wall, no evidence of metastatic disease CT/ultrasound guided biopsy of right abdominal wall mass 09/07/2023-metastatic neoplasm consistent with a metastasis from anal cancer, Foundation 1-HRD signature negative, MSS, tumor mutation burden 4, BRCA 1 alteration, PD-L1 TPS 10% Cycle 1 day 1 Taxol //carboplatin  10/19/2023, day 8 Taxol  10/26/2023, day 15 Taxol  held 11/02/2023 due to  neutropenia Cycle 2-day 1 Taxol /carboplatin  plus Retifanlimab  11/14/2023, Taxol  and carboplatin  dose reduced due to previous neutropenia, mild thrombocytopenia; day 8 Taxol  11/22/2023, day 15 Taxol  11/29/2023 Cycle 3-day 1 Taxol /carboplatin  plus retifanlimab  12/14/2023, day 8 Taxol  12/21/2023, day 15 Taxol  12/28/2023 CT abdomen/pelvis 01/09/2024: Decreased right anterior abdominal wall nodule, no evidence of disease progression Cycle 4-day 1 Taxol /carboplatin /retifanlimab  01/11/2024; day 8 Taxol  01/18/2024 (given peripherally), day 15 Taxol  01/25/2024 (given peripherally) Cycle 5-day 1 Taxol /carboplatin /retifanlimab  02/08/2024, day 8 Taxol  02/13/2024, day 15 Taxol  02/22/2024 Cycle 6-day 1 carboplatin /retifanlimib 03/07/2024, paclitaxel  placed on hold secondary to nail toxicity CT 03/14/2024: Stable right anterior abdominal wall nodule, no evidence of progressive metastatic disease, stable small nodes in the upper retroperitoneum and mediastinum Treatment continued with monthly single agent retifanlimib CT abdomen/pelvis 05/09/2024-interval growth of soft tissue mass right anterior abdominal wall. Treatment held 05/29/2024 Excision of right abdominal wall mass, excision of abdominal wall mass, abdominal wall reconstruction with mesh 06/05/2024-pathology on the right subcostal abdominal wall mass-carcinoma with basaloid features consistent with metastasis from the known anal carcinoma, carcinoma focally less than 0.1 cm from the inked margin of the specimen; right flank nodule-nodule of adipose tissue with fat necrosis, negative for malignancy. Tumor board 07/02/2024-observation recommended 08/08/2024 CTs: Seen soft tissue nodule at the ventral right abdomen has been resected with adjacent postoperative change, unchanged postoperative appearance status post right lower lobectomy, unchanged 0.4 cm peripheral left lower lobe nodule-nonspecific, unchanged prominent upper abdominal lymph nodes-nonspecific 11/07/2024 PET: 4.7 cm  hypermetabolic left gluteus mass, hypermetabolic right gluteal minimus mass, no evidence of local tumor recurrence at the rectum (mass of the left gluteus is visible on the 08/08/2024 CT, not appreciated on the 05/09/2024 CT   Remote history  of an anal fissure repair Tobacco use Moderate alcohol use Eczema Right breast mass- PET scan 03/16/2021 with focal FDG activity in the inner right breast Mammogram and right breast ultrasound 03/28/2021- negative Bilateral breast MRI 03/31/2021- 7 mm mass in the upper inner right breast correlating with the PET findings, no other abnormality, no adenopathy MRI guided biopsy of right breast mass 04/11/2021- fibrocystic change with sclerosing adenosis, small focus of inflammation/fibrosis suggestive of resolving fat necrosis, no evidence of malignancy--repeat bilateral breast MRI at a 53-month interval; bilateral breast MRI on 10/13/2021-no evidence of breast malignancy.  Annual screening mammography recommended.   7.  Admission with small bowel obstruction 12/03/2021 status post exploratory laparotomy and lysis of adhesions 8.  Right abdominal pain/tenderness-potentially related to the right abdominal wall mass noted on CT 08/31/2023 9.  Elevated liver enzymes-chronic, etiology unclear 10.  Hematuria-evaluated by urology 09/11/2023, cystoscopy with radiation and infection changes, no tumor seen 11.  Right neck/chest tenderness 12/21/2023-negative Doppler study.  Recurrent symptoms 01/15/2024.  Started on a course of Augmentin .  Referred for Port-A-Cath study 01/18/2024-study completed 02/01/2024, intact tunneled right internal jugular vein Port-A-Cath with catheter tip located just below the SVC/RA junction.  No evidence of catheter rupture, contrast extravasation or fibrin sheath. Port-A-Cath dye study 02/01/2024: Normal 12.  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  Left great toenail removed by podiatry 04/09/2024. 13.   December 2024: Negative genetic testing     Disposition: Ms. Drummer has a history of metastatic anal cancer.  I reviewed the PET findings and images with her.  The gluteal masses are very likely indicative of metastatic anal cancer.  We discussed treatment options.  She has been treated with salvage systemic therapy in the metastatic setting and has been maintained off of systemic therapy since May.   I will refer her for palliative radiation to the painful left gluteus mass.  She may also be a candidate for radiation to the right sided mass.  It is unclear whether the small scalp nodule is related to a metastasis or a benign finding.  We can consider treatment with nivolumab, though she had disease progression while on retifanlimab .  We can also consider systemic chemotherapy.  Her case will be presented at the GI tumor conference.  She will return for an office visit in approximately 3 weeks.  I refilled her prescription for oxycodone .      Arley Hof, MD  11/12/2024  10:45 AM

## 2024-11-12 NOTE — Patient Instructions (Signed)

## 2024-11-12 NOTE — Telephone Encounter (Signed)
 Dinesha left message that she is not comfortable just watching the bump on her head. Asking for a MRI or CT scan asap.

## 2024-11-13 ENCOUNTER — Other Ambulatory Visit: Payer: Self-pay

## 2024-11-18 ENCOUNTER — Other Ambulatory Visit: Payer: Self-pay | Admitting: *Deleted

## 2024-11-18 ENCOUNTER — Telehealth: Payer: Self-pay | Admitting: *Deleted

## 2024-11-18 DIAGNOSIS — C210 Malignant neoplasm of anus, unspecified: Secondary | ICD-10-CM

## 2024-11-18 NOTE — Progress Notes (Signed)
 Referral order placed for Dr. Dewey in radiation oncology. Coordinator said to put in as stat.

## 2024-11-18 NOTE — Telephone Encounter (Signed)
 Patient called to inquire when Dr. Dewey will see her? Per Dr. Cloretta and conversation with radiation oncology they would get her in this week, but she has not heard anything. Asking when her case is being reviewed with the GI conference as well.  Noted that her case will be presented tomorrow and she will be notified of results. Sent chat to referral coordinator w/rad onc. She is asking again about an MRI or CT of head to f/u on the mass she felt on her head that Dr. Cloretta examined and estimated at 3-4 mm in size. He feels it would not benefit to scan right now. Suggests to allow him to measure it again at next appointment before any scan is ordered. Informed her that the scan most likely will not identify if it is cancer or benign growth. Would require a biopsy and that is an extensive procedure due to how vascular the scalp/head is. She agrees to allow MD to measure again on 12/18.

## 2024-11-19 ENCOUNTER — Telehealth: Payer: Self-pay | Admitting: *Deleted

## 2024-11-19 ENCOUNTER — Other Ambulatory Visit: Payer: Self-pay | Admitting: *Deleted

## 2024-11-19 NOTE — Progress Notes (Signed)
 Histology and Location of Primary Cancer: Anal cancer metastatic to gluteus.   Location(s) of Symptomatic Metastases: Left gluteus   Past/Anticipated chemotherapy by medical oncology, if any:  Dr. Cloretta 11/12/2024 -Beth [name] has a history of metastatic anal cancer.  I reviewed the PET findings and images with her.  The gluteal masses are very likely indicative of metastatic anal cancer.  We discussed treatment options.   -She has been treated with salvage systemic therapy in the metastatic setting and has been maintained off of systemic therapy since May.   -I will refer her for palliative radiation to the painful left gluteus mass.  She may also be a candidate for radiation to the right sided mass.  It is unclear whether the small scalp nodule is related to a metastasis or a benign finding. -We can consider treatment with nivolumab, though she had disease progression while on retifanlimab .  We can also consider systemic chemotherapy. -Her case will be presented at the GI tumor conference.    Pain on a scale of 0-10 is:  7/10 in the left buttocks   Ambulatory status? Walker? Wheelchair?: Ambulatory  SAFETY ISSUES: Prior radiation? Anal 2022, with Dr. Dewey Pacemaker/ICD? None Possible current pregnancy? Hysterectomy Is the patient on methotrexate? No  Current Complaints / other details:   Right Chest [facility]

## 2024-11-19 NOTE — Telephone Encounter (Signed)
 Per Dr. Cloretta: Case reviewed and discussed in GI conference today and consensus is that the gluteal mass on scan is consistent with metastasis from her anal cancer and RT is treatment option at this time. She is scheduled with Dr. Dewey tomorrow. MD also suggested that Dr. Dewey look at the are on her head that has appeared.

## 2024-11-19 NOTE — Progress Notes (Signed)
 The proposed treatment discussed in conference is for discussion purpose only and is not a binding recommendation.  The patients have not been physically examined, or presented with their treatment options.  Therefore, final treatment plans cannot be decided.

## 2024-11-20 ENCOUNTER — Other Ambulatory Visit: Payer: Self-pay

## 2024-11-20 ENCOUNTER — Ambulatory Visit
Admission: RE | Admit: 2024-11-20 | Discharge: 2024-11-20 | Attending: Radiation Oncology | Admitting: Radiation Oncology

## 2024-11-20 ENCOUNTER — Ambulatory Visit: Admission: RE | Admit: 2024-11-20 | Discharge: 2024-11-20 | Attending: Radiation Oncology

## 2024-11-20 ENCOUNTER — Encounter: Payer: Self-pay | Admitting: Radiation Oncology

## 2024-11-20 VITALS — BP 144/93 | HR 93 | Temp 97.8°F | Resp 18 | Ht 67.0 in | Wt 178.2 lb

## 2024-11-20 DIAGNOSIS — C210 Malignant neoplasm of anus, unspecified: Secondary | ICD-10-CM

## 2024-11-21 ENCOUNTER — Telehealth: Payer: Self-pay | Admitting: *Deleted

## 2024-11-21 ENCOUNTER — Ambulatory Visit
Admission: RE | Admit: 2024-11-21 | Discharge: 2024-11-21 | Attending: Radiation Oncology | Admitting: Radiation Oncology

## 2024-11-21 ENCOUNTER — Ambulatory Visit (HOSPITAL_COMMUNITY)
Admission: RE | Admit: 2024-11-21 | Discharge: 2024-11-21 | Disposition: A | Source: Ambulatory Visit | Attending: Radiation Oncology | Admitting: Radiation Oncology

## 2024-11-21 DIAGNOSIS — L989 Disorder of the skin and subcutaneous tissue, unspecified: Secondary | ICD-10-CM | POA: Insufficient documentation

## 2024-11-21 DIAGNOSIS — L309 Dermatitis, unspecified: Secondary | ICD-10-CM | POA: Insufficient documentation

## 2024-11-21 DIAGNOSIS — F1090 Alcohol use, unspecified, uncomplicated: Secondary | ICD-10-CM | POA: Diagnosis not present

## 2024-11-21 DIAGNOSIS — Z510 Encounter for antineoplastic radiation therapy: Secondary | ICD-10-CM | POA: Diagnosis present

## 2024-11-21 DIAGNOSIS — C211 Malignant neoplasm of anal canal: Secondary | ICD-10-CM | POA: Diagnosis not present

## 2024-11-21 DIAGNOSIS — R748 Abnormal levels of other serum enzymes: Secondary | ICD-10-CM | POA: Diagnosis not present

## 2024-11-21 DIAGNOSIS — Z720 Tobacco use: Secondary | ICD-10-CM | POA: Insufficient documentation

## 2024-11-21 DIAGNOSIS — C210 Malignant neoplasm of anus, unspecified: Secondary | ICD-10-CM | POA: Insufficient documentation

## 2024-11-21 DIAGNOSIS — C7989 Secondary malignant neoplasm of other specified sites: Secondary | ICD-10-CM | POA: Diagnosis present

## 2024-11-21 NOTE — Telephone Encounter (Signed)
 CALLED PATIENT TO INFORM OF CT FOR 11-21-24- ARRIVAL TIME- 4:45 PM @ [facility], NO RESTRICTIONS TO SCAN, LVM FOR A RETURN CALL

## 2024-11-22 ENCOUNTER — Ambulatory Visit: Payer: Self-pay | Admitting: Radiation Oncology

## 2024-11-22 ENCOUNTER — Encounter: Payer: Self-pay | Admitting: Radiation Oncology

## 2024-11-24 ENCOUNTER — Telehealth: Payer: Self-pay | Admitting: Radiation Oncology

## 2024-11-24 ENCOUNTER — Other Ambulatory Visit: Payer: Self-pay | Admitting: Radiation Oncology

## 2024-11-24 ENCOUNTER — Encounter: Payer: Self-pay | Admitting: Radiation Oncology

## 2024-11-24 DIAGNOSIS — C210 Malignant neoplasm of anus, unspecified: Secondary | ICD-10-CM

## 2024-11-24 DIAGNOSIS — L989 Disorder of the skin and subcutaneous tissue, unspecified: Secondary | ICD-10-CM

## 2024-11-24 NOTE — Progress Notes (Signed)
 Radiation Oncology         [phone] ________________________________  Name: Beth [name]        MRN: [id number]  Date of Service: 11/20/2024 DOB: [birth year]  RR:Ijwpzo, Jerel MATSU, MD  Cloretta Arley NOVAK, MD     REFERRING PHYSICIAN: Dr. Sheldon  DIAGNOSIS: The encounter diagnosis was Anal cancer [facility]).   HISTORY OF PRESENT ILLNESS: Beth [name] is a 53 y.o. female known to our clinic with a  diagnosis of squamous cell carcinoma of the anus.  The patient has had a history of anal intraepithelial neoplasia grade 3 for which she has undergone prior excision of a fissure in 2017.  She was followed for about a year with Dr. Sheldon but did not realize she needed continued evaluation. She proceeded with screening colonoscopy in February 2022 due to turning 49. This showed an anal lesion and a biopsy showed intraepithelial neoplasia grade 3, she has never had a history of condyloma and has had normal Pap smears and even normal Paps since hysterectomy per report.  Her hysterectomy was not performed due to any type of dysplastic findings.  She was evaluated by Dr. Sheldon and underwent an exam under anesthesia with anoscopy on 02/25/2021. There were 2 sigmoid colon polyps that were removed and consistent with hyperplastic polyps and intramucosal lymphoid aggregates no dysplasia or malignancy was noted.  A lesion of the rectum showed high-grade squamous intraepithelial lesion at least cannot exclude invasive squamous cell carcinoma.  Given these findings, her case was discussed in multidisciplinary GI oncology conference. She was offered chemoradiation which she completed in May of 2022.   The patient then proceeded with surveillance but had biopsy proven persistent disease in October 2022 and was offered APR with flap reconstruction. She underwent APR/and vertical rectus abdominis flap on 11/03/2021, which showed invasive poorly differentiated carcinoma. Her  resection margins were negative, 0/10 nodes. Her  treatment response score  was 2, ypT1,ypN0. She developed PET positive RLL nodule noted by PET on 12/21/22 and underwent RLL lobectomy on 12/29/22 with Dr. Kerrin and final pathology showed poorly differentiated carcinoma consistent with metastatic disease and no nodal involvement was noted. She then had portsite recurrence along the anterior abdominal wall and had this biopsied on 09/07/23 and this confirmed metastatic disease consistent with anal carcinoma. She proceeded with systemic chemotherapies and then underwent exicision of this on 06/05/24 which again confirmed the anal carcinoma by pathology. She has since been in surveillance and a PET scan on 11/07/24 showed a 8.3 cm left gluteal mass that was hypermetabolic with an SUV not documented, and a right gluteus minimus mass not measured with an SUV of 11.1. No additional evidence of disease was noted, so her case was presented in GI oncology conference. Surgery was felt to be high risk, and rather she's seen today to discuss palliative radiotherapy.   PREVIOUS RADIATION THERAPY:   03/21/2021 through 04/27/2021 Site Technique Total Dose (Gy) Dose per Fx (Gy) Completed Fx Beam Energies  Anus: Anus IMRT 50.4/50.4 1.8 28/28 6X     PAST MEDICAL HISTORY:  Past Medical History:  Diagnosis Date   Acne    on face spirolactone for   Anal cancer (HCC) 02/25/2021   Constipation, chronic 08/08/2016   Diabetes mellitus without complication (HCC) 08/2022   Type II   Family history of adverse reaction to anesthesia    Mother has trouble waking takes longer   Herpes simplex    History of migraine    none in  years   Hypercholesteremia    Wears glasses        PAST SURGICAL HISTORY: Past Surgical History:  Procedure Laterality Date   ABDOMINAL HYSTERECTOMY  2016   partial   COLONOSCOPY     COLONOSCOPY WITH PROPOFOL  N/A 01/28/2021   Procedure: COLONOSCOPY WITH PROPOFOL ;  Surgeon: Janalyn Keene NOVAK, MD;  Location: [facility];  Service:  Endoscopy;  Laterality: N/A;   EXCISION HYDRADENITIS LABIA Left 02/25/2021   Procedure: EXCISION of  LABIAL LESIOM;  Surgeon: Sheldon Standing, MD;  Location: [facility];  Service: General;  Laterality: Left;   EXCISION OF ABDOMINAL WALL TUMOR N/A 06/05/2024   Procedure: EXCISION, NEOPLASM, ABDOMINAL WALL;  Surgeon: Sheldon Standing, MD;  Location: [facility];  Service: General;  Laterality: N/A;  REMOVAL OF ABDOMINAL WALL METASTASIS, POSSIBLE MESH HERNIA REPAIR/ABD WALL RECONSTRUCTION DIAGNOSTIC LAPAROSCOPIC   INTERCOSTAL NERVE BLOCK  12/29/2022   Procedure: INTERCOSTAL NERVE BLOCK;  Surgeon: Kerrin Standing BROCKS, MD;  Location: [facility];  Service: Thoracic;;   IR CV LINE INJECTION  02/01/2024   IR FLUORO GUIDE CV LINE RIGHT  04/18/2021   IR IMAGING GUIDED PORT INSERTION  10/18/2023   IR PATIENT EVAL TECH 0-60 MINS  01/16/2024   IR PATIENT EVAL TECH 0-60 MINS  01/25/2024   LAPAROSCOPY N/A 06/05/2024   Procedure: LAPAROSCOPY, DIAGNOSTIC;  Surgeon: Sheldon Standing, MD;  Location: [facility];  Service: General;  Laterality: N/A;   LAPAROTOMY N/A 12/03/2021   Procedure: EXPLORATORY LAPAROTOMY, LYSIS OF ADHESIONS;  Surgeon: Rubin Calamity, MD;  Location: [facility];  Service: General;  Laterality: N/A;   LYMPH NODE DISSECTION Right 12/29/2022   Procedure: LYMPH NODE DISSECTION;  Surgeon: Kerrin Standing BROCKS, MD;  Location: [facility];  Service: Thoracic;  Laterality: Right;   OSTOMY N/A 11/03/2021   Procedure: END COLOSTOMY;  Surgeon: Sheldon Standing, MD;  Location: [facility];  Service: General;  Laterality: N/A;   PECTORALIS FLAP N/A 11/03/2021   Procedure: VERTICAL RECTUS ABDOMINIS MYOCUTANEOUS TO PERINEUM;  Surgeon: Arelia Filippo, MD;  Location: [facility];  Service: Plastics;  Laterality: N/A;   RECTAL BIOPSY N/A 09/30/2021   Procedure: ANORECTAL MASS BIOPSY;  Surgeon: Sheldon Standing, MD;  Location: [facility];  Service: General;  Laterality: N/A;   RECTAL EXAM UNDER ANESTHESIA N/A 02/25/2021   Procedure: ANORECTAL EXAM  UNDER ANESTHESIA;  Surgeon: Sheldon Standing, MD;  Location: [facility];  Service: General;  Laterality: N/A;   RECTAL EXAM UNDER ANESTHESIA N/A 09/30/2021   Procedure: ANORECTAL EXAMINATION UNDER ANESTHESIA;  Surgeon: Sheldon Standing, MD;  Location: [facility];  Service: General;  Laterality: N/A;  GEN & LOCAL ANESTHESIA   SPHINCTEROTOMY N/A 08/30/2016   Procedure: LATERAL INTERNAL AND SPHINCTEROTOMY;  Surgeon: Standing Sheldon, MD;  Location: [facility];  Service: General;  Laterality: N/A;   TONSILLECTOMY  as child   TRANSANAL EXCISION OF RECTAL MASS N/A 02/25/2021   Procedure: TRANSANAL EXCISIONAL  BIOPSY OF ANAL CANAL MASS;  Surgeon: Sheldon Standing, MD;  Location: [facility];  Service: General;  Laterality: N/A;   XI ROBOT ABDOMINAL PERINEAL RESECTION N/A 11/03/2021   Procedure: XI ROBOT ABDOMINOPERINEAL RESECTION;  Surgeon: Sheldon Standing, MD;  Location: [facility];  Service: General;  Laterality: N/A;     FAMILY HISTORY:  Family History  Problem Relation Age of Onset   Breast cancer Paternal Aunt 78       neg GT   Prostate cancer Paternal Uncle        dx unkown age  Skin cancer Maternal Grandfather        face; dx >50   Cancer Other        breast ca in PGM's sister; metastatic prostate cancer in PGM's brother; lung cancer in PGM's sister   Breast cancer Cousin        paternal female cousin; dx before age 21   Cancer Paternal Great-grandfather        liver; PGM's father   Cancer Paternal Great-grandmother        unk GYN; PGM's mother     SOCIAL HISTORY:  reports that she quit smoking about 3 years ago. Her smoking use included cigarettes. She started smoking about 11 years ago. She has a 8 pack-year smoking history. She has never used smokeless tobacco. She reports current alcohol use of about 14.0 standard drinks of alcohol per week. She reports that she does not use drugs. The patient is married and lives in [place]. She works in theatre stage manager in a psychologist, educational.    ALLERGIES: Clindamycin /lincomycin and Sulfa drugs cross reactors   MEDICATIONS:  Current Outpatient Medications  Medication Sig Dispense Refill   acetaminophen  (TYLENOL ) 500 MG tablet Take 1,000 mg by mouth every 6 (six) hours as needed (for pain.).     B Complex Vitamins (VITAMIN B COMPLEX PO) Take 1 each by mouth daily. Gummies     cetirizine (ZYRTEC) 10 MG tablet Take 10 mg by mouth daily as needed for allergies.     clobetasol ointment (TEMOVATE) 0.05 % Apply 1 Application topically at bedtime as needed (Apply to palms at bedtime and cover with gloves).     Continuous Glucose Sensor (DEXCOM G7 SENSOR) MISC SMARTSIG:1 Unit(s) SUB-Q Every 10 Days     docusate sodium  (COLACE) 100 MG capsule Take 200 mg by mouth daily. (Patient taking differently: Take 200 mg by mouth daily as needed.)     fluticasone  (FLONASE ) 50 MCG/ACT nasal spray Place 1 spray into both nostrils daily as needed for allergies or rhinitis.     gabapentin  (NEURONTIN ) 300 MG capsule Take 300 mg by mouth 3 (three) times daily. Takes w/600 mg capsule to equal 900 mg     gabapentin  (NEURONTIN ) 600 MG tablet Take 600 mg by mouth in the morning, at noon, and at bedtime. Morning, afternoon & night     glipiZIDE  (GLUCOTROL  XL) 5 MG 24 hr tablet Take 5 mg by mouth every morning.     HUMULIN R  100 UNIT/ML injection Inject into the skin 3 (three) times daily before meals. Sliding scale: 1-4 units based on glucose     hydrOXYzine  (ATARAX ) 25 MG tablet Take 25 mg by mouth at bedtime.     ibuprofen  (ADVIL ) 200 MG tablet Take 400 mg by mouth every 8 (eight) hours as needed (for pain.).     LANTUS  SOLOSTAR 100 UNIT/ML Solostar Pen Inject 50 Units into the skin daily.     lidocaine -prilocaine  (EMLA ) cream Apply 1 Application topically as needed (Apply to port area 1-2 hours, start using 2 weeks after placement). 30 g 2   losartan (COZAAR) 25 MG tablet Take 25 mg by mouth daily.     naproxen  sodium (ALEVE ) 220 MG tablet Take 220  mg by mouth.     NONFORMULARY OR COMPOUNDED ITEM Apply topically at bedtime. FORMULA 20--for nails; compounded in MD office     ondansetron  (ZOFRAN ) 8 MG tablet Take 1 tablet (8 mg total) by mouth every 8 (eight) hours as needed (Take after 3 days after  chemo as needed for nausea). 20 tablet 2   oxyCODONE  (OXY IR/ROXICODONE ) 5 MG immediate release tablet Take 0.5-1 tablets (2.5-5 mg total) by mouth every 4 (four) hours as needed for severe pain (pain score 7-10) or breakthrough pain. 90 tablet 0   polyethylene glycol (MIRALAX  / GLYCOLAX ) 17 g packet Take 17 g by mouth in the morning.     prochlorperazine  (COMPAZINE ) 10 MG tablet Take 1 tablet (10 mg total) by mouth every 6 (six) hours as needed for nausea or vomiting. 30 tablet 2   rosuvastatin  (CRESTOR ) 10 MG tablet Take 10 mg by mouth every other day.     spironolactone  (ALDACTONE ) 100 MG tablet Take 100 mg by mouth daily in the afternoon.     triamcinolone  cream (KENALOG ) 0.5 % Apply 1 Application topically 2 (two) times daily as needed (skin irritation.). On legs     valACYclovir  (VALTREX ) 500 MG tablet Take 500 mg by mouth in the morning.     pantoprazole  (PROTONIX ) 40 MG tablet TAKE 1 TABLET BY MOUTH EVERY DAY (Patient not taking: Reported on 11/20/2024) 90 tablet 0   No current facility-administered medications for this encounter.   Facility-Administered Medications Ordered in Other Encounters  Medication Dose Route Frequency Provider Last Rate Last Admin   0.9 %  sodium chloride  infusion   Intravenous Continuous Cloretta Arley NOVAK, MD   Stopped at 12/21/23 1531   heparin  lock flush 100 unit/mL  500 Units Intracatheter Once PRN Cloretta Arley NOVAK, MD       sodium chloride  flush (NS) 0.9 % injection 10 mL  10 mL Intracatheter PRN Cloretta Arley NOVAK, MD       sodium chloride  flush (NS) 0.9 % injection 10 mL  10 mL Intravenous PRN Sherrill, Gary B, MD   10 mL at 05/29/24 1443   sodium chloride  flush (NS) 0.9 % injection 10 mL  10 mL Intravenous PRN  Cloretta Arley NOVAK, MD   10 mL at 06/25/24 1204     REVIEW OF SYSTEMS: On review of systems, the patient reports that she has been having pain in bilateral hip and buttock area, the left side is more posterior, and she is using oxycodone  as needed. She is also concerned that in the last two months or so she's developed a painful area on her scalp in the middle of her part toward the crown of her head. This is not eroding the skin or draining, but it is very sore when she brushes her hair or runs her fingers through her hair. No other complaints are verbalized.      PHYSICAL EXAM:  Wt Readings from Last 3 Encounters:  11/20/24 178 lb 3.2 oz (80.8 kg)  11/12/24 177 lb 3.2 oz (80.4 kg)  10/10/24 177 lb 1.6 oz (80.3 kg)   Pain Assessment Pain Score: 7  Pain Loc: Buttocks/10  In general this is a well appearing Caucasian in no acute distress.  She's alert and oriented x4 and appropriate throughout the examination. Cardiopulmonary assessment is negative for acute distress and she exhibits normal effort. There is a 1-1.5 cm raised area along the zenith of the scalp. It is firm, non mobile, non fluctuant, and no punctate are is appreciated.     ECOG =   0 - Asymptomatic (Fully active, able to carry on all predisease activities without restriction)  1 - Symptomatic but completely ambulatory (Restricted in physically strenuous activity but ambulatory and able to carry out work of a light or sedentary nature. For  example, light housework, office work)  2 - Symptomatic, <50% in bed during the day (Ambulatory and capable of all self care but unable to carry out any work activities. Up and about more than 50% of waking hours)  3 - Symptomatic, >50% in bed, but not bedbound (Capable of only limited self-care, confined to bed or chair 50% or more of waking hours)  4 - Bedbound (Completely disabled. Cannot carry on any self-care. Totally confined to bed or chair)  5 - Death   Raylene MM, Creech RH,  Tormey DC, et al. [phone]). Toxicity and response criteria of the [facility]. Am. DOROTHA Bridges. Oncol. 5 (6): 649-55    LABORATORY DATA:  Lab Results  Component Value Date   WBC 7.1 10/10/2024   HGB 13.6 10/10/2024   HCT 40.1 10/10/2024   MCV 99.3 10/10/2024   PLT 123 (L) 10/10/2024   Lab Results  Component Value Date   NA 136 10/10/2024   K 4.2 10/10/2024   CL 101 10/10/2024   CO2 26 10/10/2024   Lab Results  Component Value Date   ALT 142 (H) 10/10/2024   AST 78 (H) 10/10/2024   ALKPHOS 112 10/10/2024   BILITOT 0.3 10/10/2024      RADIOGRAPHY: CT HEAD WO CONTRAST ( ) Result Date: 11/21/2024 EXAM: CT HEAD WITHOUT CONTRAST 11/21/2024 05:46:00 PM TECHNIQUE: CT of the head was performed without the administration of intravenous contrast. Automated exposure control, iterative reconstruction, and/or weight based adjustment of the mA/kV was utilized to reduce the radiation dose to as low as reasonably achievable. COMPARISON: None available. CLINICAL HISTORY: Metastatic disease evaluation; evaluate lesion at the zenith of the scalp that is painful in pt with history of metastatic anal cancer. FINDINGS: BRAIN AND VENTRICLES: No acute hemorrhage. No evidence of acute infarct. No hydrocephalus. No extra-axial collection. No mass effect or midline shift. ORBITS: No acute abnormality. SINUSES: No acute abnormality. SOFT TISSUES AND SKULL: A 4 mm scalp soft tissue lesion is present to the left midline near the crown of the head. Soft tissue extends to the lvm. No focal osseous lesion is present. No skull fracture. IMPRESSION: 1. 4 mm scalp soft tissue lesion to the left of midline near the crown, extending to the level of the vertex, without underlying osseous involvement. This is nonspecific. Please correlate with direct exam. 2. No acute intracranial abnormality. Electronically signed by: Lonni Necessary MD 11/21/2024 06:01 PM EST RP Workstation: [id number]   NM PET  Image Restage (PS) Skull Base to Thigh (F-18 FDG) Result Date: 11/11/2024 EXAM: PET AND CT SKULL BASE TO MID THIGH 11/07/2024 08:04:57 AM TECHNIQUE: RADIOPHARMACEUTICAL: 8.81 mCi F-18 fludeoxyglucose  (FDG) Uptake time 60 minutes. Glucose level 155 mg/dl. PET imaging was acquired from the base of the skull to the mid thighs. Non-contrast enhanced computed tomography was obtained for attenuation correction and anatomic localization. COMPARISON: CT 08/08/2024. CLINICAL HISTORY: Restaging anal cancer, left buttock pain. FINDINGS: HEAD AND NECK: No metabolically active cervical lymphadenopathy. CHEST: There is a port in the right chest wall. No residual pulmonary nodules. No lesions in the thorax. ABDOMEN AND PELVIS: Post proctectomy and post hysterectomy. No recurrent lesion lesions in the pelvic floor. No hypermetabolic abdominal or pelvic lymph nodes. No liver metastases. Postsurgical change consists with distal rectal resection without evidence of recurrence in the resection bed. No visceral metastasis in the abdomen or pelvis. BONES AND SOFT TISSUE: Intensely hypermetabolic lesion in the left gluteus maximus muscle posterior to the left femur measuring 4.7 cm with  an axial max of 8.3 cm. Second lesion in the gluteus minimus muscle posterior to the right iliac bone, intensely hypermetabolic with SUVmax equal 11.1 on image 166. No clear CT lesi. on on the noncontrast exam. No abnormal FDG activity localizes to the bones. IMPRESSION: 1. Two intramuscular metastasis are present. 1 in the left gluteus maximus muscle and 1 in the right gluteus minimus muscle. 2. Postsurgical change consists with distal rectal resection without evidence of recurrence in the resection bed. 3.  no visceral metastasis in the chest  abdomen or pelvis. 4.  No metastatic adenopathy Electronically signed by: Norleen Boxer MD 11/11/2024 04:48 PM EST RP Workstation: [id number]       IMPRESSION/PLAN: 1. Recurrent Metastatic Squamous cell  carcinoma of the anus. Dr. Dewey discusses the pathology findings and reviews the nature of palliative radiotherapy and he offers a course to bilateral gluteal metastases. We also discussed the finding at the top of the scalp, and will plan a CT scan of the head without contrast to better evaluate for wehther this is concernign for metastatic versus benign process. We discussed the risks, benefits, short, and long term effects of radiotherapy, as well as the pallitive intent, and the patient is interested in proceeding. Dr. Dewey discusses the delivery and logistics of radiotherapy and anticipates a course of 2 weeks of radiotherapy to bilateral gluteal metastases. Written consent is obtained and placed in the chart, a copy was provided to the patient. The patient will be contacted to coordinate treatment planning by our simulation department.    In a visit lasting 45 minutes, greater than 50% of the time was spent face to face discussing the patient's condition, in preparation for the discussion, and coordinating the patient's care.    The above documentation reflects my direct findings during this shared patient visit. Please see the separate note by Dr. Dewey on this date for the remainder of the patient's plan of care.    Donald KYM Husband, [facility]   **Disclaimer: This note was dictated with voice recognition software. Similar sounding words can inadvertently be transcribed and this note may contain transcription errors which may not have been corrected upon publication of note.**

## 2024-11-24 NOTE — Telephone Encounter (Signed)
 12/8 Outgoing referral for Plastic Surgery -Patient now scheduled on 12/31 @ 10:30 am with Dr. Arelia -per [facility].

## 2024-11-24 NOTE — Addendum Note (Signed)
 Encounter addended by: Lanell Donald Stagger, PA-C on: 11/24/2024 8:46 AM  Actions taken: Clinical Note Signed, Level of Service modified

## 2024-11-25 DIAGNOSIS — Z510 Encounter for antineoplastic radiation therapy: Secondary | ICD-10-CM | POA: Diagnosis not present

## 2024-11-27 ENCOUNTER — Other Ambulatory Visit: Payer: Self-pay

## 2024-11-27 ENCOUNTER — Ambulatory Visit
Admission: RE | Admit: 2024-11-27 | Discharge: 2024-11-27 | Attending: Radiation Oncology | Admitting: Radiation Oncology

## 2024-11-27 DIAGNOSIS — Z510 Encounter for antineoplastic radiation therapy: Secondary | ICD-10-CM | POA: Diagnosis not present

## 2024-11-27 LAB — RAD ONC ARIA SESSION SUMMARY

## 2024-11-28 ENCOUNTER — Ambulatory Visit: Admission: RE | Admit: 2024-11-28 | Discharge: 2024-11-28 | Attending: Radiation Oncology

## 2024-11-28 ENCOUNTER — Ambulatory Visit: Admitting: Radiation Oncology

## 2024-11-28 ENCOUNTER — Ambulatory Visit
Admission: RE | Admit: 2024-11-28 | Discharge: 2024-11-28 | Disposition: A | Source: Ambulatory Visit | Attending: Radiation Oncology

## 2024-11-28 ENCOUNTER — Other Ambulatory Visit: Payer: Self-pay

## 2024-11-28 DIAGNOSIS — Z510 Encounter for antineoplastic radiation therapy: Secondary | ICD-10-CM | POA: Diagnosis not present

## 2024-11-28 LAB — RAD ONC ARIA SESSION SUMMARY

## 2024-12-01 ENCOUNTER — Other Ambulatory Visit: Payer: Self-pay

## 2024-12-01 ENCOUNTER — Ambulatory Visit
Admission: RE | Admit: 2024-12-01 | Discharge: 2024-12-01 | Disposition: A | Source: Ambulatory Visit | Attending: Radiation Oncology

## 2024-12-01 DIAGNOSIS — Z510 Encounter for antineoplastic radiation therapy: Secondary | ICD-10-CM | POA: Diagnosis not present

## 2024-12-01 LAB — RAD ONC ARIA SESSION SUMMARY
Course Elapsed Days: 4
Plan Fractions Treated to Date: 3
Plan Fractions Treated to Date: 3
Plan Prescribed Dose Per Fraction: 3 Gy
Plan Prescribed Dose Per Fraction: 3 Gy
Plan Total Fractions Prescribed: 10
Plan Total Fractions Prescribed: 10
Plan Total Prescribed Dose: 30 Gy
Plan Total Prescribed Dose: 30 Gy
Reference Point Dosage Given to Date: 9 Gy
Reference Point Dosage Given to Date: 9 Gy
Reference Point Session Dosage Given: 3 Gy
Reference Point Session Dosage Given: 3 Gy
Session Number: 3

## 2024-12-02 ENCOUNTER — Other Ambulatory Visit: Payer: Self-pay

## 2024-12-02 ENCOUNTER — Ambulatory Visit: Admission: RE | Admit: 2024-12-02 | Discharge: 2024-12-02 | Attending: Radiation Oncology

## 2024-12-02 DIAGNOSIS — Z510 Encounter for antineoplastic radiation therapy: Secondary | ICD-10-CM | POA: Diagnosis not present

## 2024-12-02 LAB — RAD ONC ARIA SESSION SUMMARY

## 2024-12-03 ENCOUNTER — Ambulatory Visit: Admission: RE | Admit: 2024-12-03 | Discharge: 2024-12-03 | Attending: Radiation Oncology

## 2024-12-03 ENCOUNTER — Other Ambulatory Visit: Payer: Self-pay

## 2024-12-03 DIAGNOSIS — Z510 Encounter for antineoplastic radiation therapy: Secondary | ICD-10-CM | POA: Diagnosis not present

## 2024-12-03 LAB — RAD ONC ARIA SESSION SUMMARY

## 2024-12-04 ENCOUNTER — Inpatient Hospital Stay

## 2024-12-04 ENCOUNTER — Other Ambulatory Visit: Payer: Self-pay

## 2024-12-04 ENCOUNTER — Inpatient Hospital Stay: Admitting: Nurse Practitioner

## 2024-12-04 ENCOUNTER — Encounter: Payer: Self-pay | Admitting: Nurse Practitioner

## 2024-12-04 ENCOUNTER — Ambulatory Visit

## 2024-12-04 DIAGNOSIS — R748 Abnormal levels of other serum enzymes: Secondary | ICD-10-CM | POA: Insufficient documentation

## 2024-12-04 DIAGNOSIS — Z510 Encounter for antineoplastic radiation therapy: Secondary | ICD-10-CM | POA: Diagnosis not present

## 2024-12-04 DIAGNOSIS — F1090 Alcohol use, unspecified, uncomplicated: Secondary | ICD-10-CM | POA: Insufficient documentation

## 2024-12-04 DIAGNOSIS — C7989 Secondary malignant neoplasm of other specified sites: Secondary | ICD-10-CM | POA: Insufficient documentation

## 2024-12-04 DIAGNOSIS — L989 Disorder of the skin and subcutaneous tissue, unspecified: Secondary | ICD-10-CM | POA: Insufficient documentation

## 2024-12-04 DIAGNOSIS — L309 Dermatitis, unspecified: Secondary | ICD-10-CM | POA: Insufficient documentation

## 2024-12-04 DIAGNOSIS — C211 Malignant neoplasm of anal canal: Secondary | ICD-10-CM | POA: Insufficient documentation

## 2024-12-04 DIAGNOSIS — C210 Malignant neoplasm of anus, unspecified: Secondary | ICD-10-CM | POA: Diagnosis not present

## 2024-12-04 DIAGNOSIS — Z720 Tobacco use: Secondary | ICD-10-CM | POA: Insufficient documentation

## 2024-12-04 LAB — CBC WITH DIFFERENTIAL (CANCER CENTER ONLY)
Abs Immature Granulocytes: 0.03 K/uL (ref 0.00–0.07)
Basophils Absolute: 0 K/uL (ref 0.0–0.1)
Basophils Relative: 1 %
Eosinophils Absolute: 0.2 K/uL (ref 0.0–0.5)
Eosinophils Relative: 3 %
HCT: 39.9 % (ref 36.0–46.0)
Hemoglobin: 13.4 g/dL (ref 12.0–15.0)
Immature Granulocytes: 1 %
Lymphocytes Relative: 14 %
Lymphs Abs: 0.8 K/uL (ref 0.7–4.0)
MCH: 33.3 pg (ref 26.0–34.0)
MCHC: 33.6 g/dL (ref 30.0–36.0)
MCV: 99 fL (ref 80.0–100.0)
Monocytes Absolute: 0.5 K/uL (ref 0.1–1.0)
Monocytes Relative: 9 %
Neutro Abs: 4 K/uL (ref 1.7–7.7)
Neutrophils Relative %: 72 %
Platelet Count: 127 K/uL — ABNORMAL LOW (ref 150–400)
RBC: 4.03 MIL/uL (ref 3.87–5.11)
RDW: 12.8 % (ref 11.5–15.5)
WBC Count: 5.6 K/uL (ref 4.0–10.5)
nRBC: 0 % (ref 0.0–0.2)

## 2024-12-04 LAB — CMP (CANCER CENTER ONLY)
ALT: 150 U/L — ABNORMAL HIGH (ref 0–44)
AST: 87 U/L — ABNORMAL HIGH (ref 15–41)
Albumin: 4.4 g/dL (ref 3.5–5.0)
Alkaline Phosphatase: 99 U/L (ref 38–126)
Anion gap: 10 (ref 5–15)
BUN: 10 mg/dL (ref 6–20)
CO2: 26 mmol/L (ref 22–32)
Calcium: 9.8 mg/dL (ref 8.9–10.3)
Chloride: 100 mmol/L (ref 98–111)
Creatinine: 0.51 mg/dL (ref 0.44–1.00)
GFR, Estimated: >60 mL/min (ref >60)
Glucose, Bld: 168 mg/dL — ABNORMAL HIGH (ref 70–99)
Potassium: 4.4 mmol/L (ref 3.5–5.1)
Sodium: 136 mmol/L (ref 135–145)
Total Bilirubin: 0.7 mg/dL (ref 0.0–1.2)
Total Protein: 7.3 g/dL (ref 6.5–8.1)

## 2024-12-04 LAB — RAD ONC ARIA SESSION SUMMARY

## 2024-12-04 MED ORDER — OXYCODONE HCL 5 MG PO TABS: 2.5000 mg | ORAL_TABLET | ORAL | 0 refills | Status: DC | PRN

## 2024-12-04 NOTE — Progress Notes (Signed)
 [facility] OFFICE PROGRESS NOTE   Diagnosis: Anal cancer  INTERVAL HISTORY:   Ms. Tindall returns as scheduled.  She began radiation to the gluteal masses 11/27/2024.  She reports constant pain at the left buttock.  No pain at the right buttock.  She continues to have intermittent pain at the right abdomen.  She has occasional nausea.  Colostomy functioning.  No urinary symptoms.  She denies fever, cough, shortness of breath.  Overall good appetite.  She is unsure if the scalp lesion has changed but it continues to be painful.  Objective:  Vital signs in last 24 hours:  Blood pressure 134/87, pulse 89, temperature (!) 97.3 F (36.3 C), temperature source Temporal, resp. rate 16, weight 178 lb 14.4 oz (81.1 kg), last menstrual period 03/13/2012, SpO2 99%.    HEENT: No thrush or ulcers. Lymphatics: No palpable cervical or supraclavicular lymph nodes Resp: Lungs clear bilaterally. Cardio: Regular rate and rhythm. GI: No hepatosplenomegaly.  Left lower quadrant colostomy. Vascular: No leg edema. Neuro: Alert and oriented. Skin: Tender approximate 5 mm nodular lesion central parietal scalp. Port-A-Cath without erythema.  Lab Results:  Lab Results  Component Value Date   WBC 5.6 12/04/2024   HGB 13.4 12/04/2024   HCT 39.9 12/04/2024   MCV 99.0 12/04/2024   PLT 127 (L) 12/04/2024   NEUTROABS 4.0 12/04/2024    Imaging:  No results found.  Medications: I have reviewed the patient's current medications.  Assessment/Plan: Squamous cell carcinoma of the anal canal Colonoscopy 01/28/2021-rectal mass extending to the outside, palpable on exam-biopsy revealed high-grade squamous intraepithelial lesion (AIN 3/carcinoma in situ) Exam under anesthesia with transanal excision of an anal canal mass 02/25/2021-frozen section consistent with squamous cell carcinoma, 4 x 4 centimeter sessile anal canal mass extending to the anal verge, right lateral and posterior, 30%  circumferential, fixed to the anal sphincter complex; anal rectal mass with mixed high-grade neuroendocrine squamous cell carcinoma; posterior anal tag with squamous cell carcinoma; left labial lesion-condyloma acuminata CTs 03/07/2021-ill-definition of tissue planes along the anus with abnormal soft tissue prominence posteriorly along the anal canal suspicious for mass.  Small adjacent lower perirectal lymph nodes in addition to a 0.5 cm sacral node.  Focal enhancing lesion in the right hepatic lobe 1.2 x 0.8 x 0.9 cm.  2.1 x 1.5 cm cystic lesion right ovary with thin enhancing rim. PET scan 03/16/2021-hypermetabolic mass within the anal canal.  No definite evidence of local or distant metastatic disease.  7 mm left submandibular lymph node with mild FDG uptake, likely reactive.  Focal site of FDG uptake within the inner quadrant of the right breast. Radiation 03/21/2021-04/27/2021 Cycle one 5-FU/mitomycin -C 03/21/2021 Cycle two 5-FU/Mitomycin -C 04/18/2021, 5-FU dose reduced secondary to mucositis Anoscopy by Dr. Sheldon 07/20/2021-divet in the right posterior lateral aspect with some right anterior and right lateral folds consistent with radiation treatment.  No ulceration.  Not fully resolved but markedly shrunk down. Anorectal mass 09/30/2021 examination under anesthesia, biopsy-poorly differentiated carcinoma-basaloid squamous carcinoma and high-grade neuroendocrine carcinoma, margins positive APR/and vertical rectus abdominis flap 11/03/2021, invasive poorly differentiated carcinoma, negative resection margins, 0/10 nodes, treatment response score 2,ypT1,ypN0 CT abdomen/pelvis 12/03/2021-high-grade small bowel obstruction transitioning in the left lower abdomen.  Presacral rim-enhancing 4.4 x 2.8 x 3.8 cm fluid collection.  No air in the collection.  Prior study described a potential abnormality in the right lobe of the liver, not seen on current study.  Cystic lesion of the right ovary resolved. 12/04/2021:  Lysis of adhesions secondary  to internal hernia CT chest 12/07/2021-interval development of bilateral trace pleural effusions. CTs 12/04/2022-new 2.6 x 2.5 cm right lower lobe nodule PET 12/21/2022-FDG avid right lower lobe nodule, no other evidence of metastatic disease 12/29/2022 right lower lobectomy, lymph node dissection-poorly differentiated carcinoma consistent with metastasis, 4.8 x 2.9 x 2.6 cm, margins free, 7 lymph nodes negative for carcinoma; cytohistomorphology correlation supports the current tumor being a metastasis from the previously diagnosed anal carcinoma CT chest 04/17/2023-interval right lower lobectomy with resection of right lung mass, pleural thickening and small right pleural effusion, no new mass or lymph node enlargement CTs 07/20/2023-diminished postoperative right pleural effusion.  Unchanged postoperative findings status post abdominoperineal resection with left lower quadrant and colostomy.  No evidence of metastatic disease. CT abdomen/pelvis 08/31/2023-heterogenous soft tissue nodule in the right upper quadrant anterior abdominal wall, no evidence of metastatic disease CT/ultrasound guided biopsy of right abdominal wall mass 09/07/2023-metastatic neoplasm consistent with a metastasis from anal cancer, Foundation 1-HRD signature negative, MSS, tumor mutation burden 4, BRCA 1 alteration, PD-L1 TPS 10% Cycle 1 day 1 Taxol //carboplatin  10/19/2023, day 8 Taxol  10/26/2023, day 15 Taxol  held 11/02/2023 due to neutropenia Cycle 2-day 1 Taxol /carboplatin  plus Retifanlimab  11/14/2023, Taxol  and carboplatin  dose reduced due to previous neutropenia, mild thrombocytopenia; day 8 Taxol  11/22/2023, day 15 Taxol  11/29/2023 Cycle 3-day 1 Taxol /carboplatin  plus retifanlimab  12/14/2023, day 8 Taxol  12/21/2023, day 15 Taxol  12/28/2023 CT abdomen/pelvis 01/09/2024: Decreased right anterior abdominal wall nodule, no evidence of disease progression Cycle 4-day 1 Taxol /carboplatin /retifanlimab  01/11/2024;  day 8 Taxol  01/18/2024 (given peripherally), day 15 Taxol  01/25/2024 (given peripherally) Cycle 5-day 1 Taxol /carboplatin /retifanlimab  02/08/2024, day 8 Taxol  02/13/2024, day 15 Taxol  02/22/2024 Cycle 6-day 1 carboplatin /retifanlimib 03/07/2024, paclitaxel  placed on hold secondary to nail toxicity CT 03/14/2024: Stable right anterior abdominal wall nodule, no evidence of progressive metastatic disease, stable small nodes in the upper retroperitoneum and mediastinum Treatment continued with monthly single agent retifanlimib CT abdomen/pelvis 05/09/2024-interval growth of soft tissue mass right anterior abdominal wall. Treatment held 05/29/2024 Excision of right abdominal wall mass, excision of abdominal wall mass, abdominal wall reconstruction with mesh 06/05/2024-pathology on the right subcostal abdominal wall mass-carcinoma with basaloid features consistent with metastasis from the known anal carcinoma, carcinoma focally less than 0.1 cm from the inked margin of the specimen; right flank nodule-nodule of adipose tissue with fat necrosis, negative for malignancy. Tumor board 07/02/2024-observation recommended 08/08/2024 CTs: Seen soft tissue nodule at the ventral right abdomen has been resected with adjacent postoperative change, unchanged postoperative appearance status post right lower lobectomy, unchanged 0.4 cm peripheral left lower lobe nodule-nonspecific, unchanged prominent upper abdominal lymph nodes-nonspecific 11/07/2024 PET: 4.7 cm hypermetabolic left gluteus mass, hypermetabolic right gluteal minimus mass, no evidence of local tumor recurrence at the rectum (mass of the left gluteus is visible on the 08/08/2024 CT, not appreciated on the 05/09/2024 CT Head CT 11/21/2024-4 mm scalp soft tissue lesion to the left of midline near the crown, extending to the level of the vertex, without underlying osseous involvement. Radiation bilateral gluteus mass 11/27/2024    Remote history of an anal fissure  repair Tobacco use Moderate alcohol use Eczema Right breast mass- PET scan 03/16/2021 with focal FDG activity in the inner right breast Mammogram and right breast ultrasound 03/28/2021- negative Bilateral breast MRI 03/31/2021- 7 mm mass in the upper inner right breast correlating with the PET findings, no other abnormality, no adenopathy MRI guided biopsy of right breast mass 04/11/2021- fibrocystic change with sclerosing adenosis, small focus of inflammation/fibrosis suggestive of resolving fat necrosis,  no evidence of malignancy--repeat bilateral breast MRI at a 72-month interval; bilateral breast MRI on 10/13/2021-no evidence of breast malignancy.  Annual screening mammography recommended.   7.  Admission with small bowel obstruction 12/03/2021 status post exploratory laparotomy and lysis of adhesions 8.  Right abdominal pain/tenderness-potentially related to the right abdominal wall mass noted on CT 08/31/2023 9.  Elevated liver enzymes-chronic, etiology unclear 10.  Hematuria-evaluated by urology 09/11/2023, cystoscopy with radiation and infection changes, no tumor seen 11.  Right neck/chest tenderness 12/21/2023-negative Doppler study.  Recurrent symptoms 01/15/2024.  Started on a course of Augmentin .  Referred for Port-A-Cath study 01/18/2024-study completed 02/01/2024, intact tunneled right internal jugular vein Port-A-Cath with catheter tip located just below the SVC/RA junction.  No evidence of catheter rupture, contrast extravasation or fibrin sheath. Port-A-Cath dye study 02/01/2024: Normal 12.  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  Left great toenail removed by podiatry 04/09/2024. 13.  December 2024: Negative genetic testing  Disposition: Ms. Hosman has metastatic anal cancer.  She is currently completing a course of palliative radiation to bilateral gluteal lesions.  She continues oxycodone  for pain.  The scalp nodule appears slightly  larger.  She understands this may be a metastatic lesion.  Dr. Cloretta discussed systemic treatment options.  She has seen Dr. Markham at [facility] in the past.  We will reach out to Dr. Markham regarding systemic treatment options, clinical trial availability.    She will return for follow-up in approximately 1 month.  We are available to see her sooner if needed.  Patient seen with Dr. Cloretta.  Olam Ned ANP/GNP-BC   12/04/2024  8:17 AM   This was a shared visit with Olam Ned.  Ms. Valvo was interviewed and examined.  She is completing a course of palliative radiation to the gluteal masses.  We suspect the scalp lesion is a metastasis.  We discussed systemic treatment options including nivolumab and 5-FU/cisplatin.  I do not recommend further taxane therapy given the severe nailbed toxicity she experienced with paclitaxel .  I will contact Dr. Markham to get his opinion.  Arvella Cloretta, MD

## 2024-12-05 ENCOUNTER — Other Ambulatory Visit: Payer: Self-pay

## 2024-12-05 ENCOUNTER — Ambulatory Visit: Admission: RE | Admit: 2024-12-05 | Discharge: 2024-12-05 | Attending: Radiation Oncology

## 2024-12-05 DIAGNOSIS — Z510 Encounter for antineoplastic radiation therapy: Secondary | ICD-10-CM | POA: Diagnosis not present

## 2024-12-05 LAB — RAD ONC ARIA SESSION SUMMARY

## 2024-12-08 ENCOUNTER — Ambulatory Visit: Admitting: Radiation Oncology

## 2024-12-08 ENCOUNTER — Other Ambulatory Visit: Payer: Self-pay

## 2024-12-08 ENCOUNTER — Ambulatory Visit: Admission: RE | Admit: 2024-12-08 | Discharge: 2024-12-08 | Attending: Radiation Oncology

## 2024-12-08 DIAGNOSIS — Z510 Encounter for antineoplastic radiation therapy: Secondary | ICD-10-CM | POA: Diagnosis not present

## 2024-12-08 LAB — RAD ONC ARIA SESSION SUMMARY
Course Elapsed Days: 11
Plan Fractions Treated to Date: 8
Plan Prescribed Dose Per Fraction: 3 Gy
Plan Total Fractions Prescribed: 10
Plan Total Prescribed Dose: 30 Gy
Reference Point Dosage Given to Date: 24 Gy
Reference Point Session Dosage Given: 0.8138 Gy
Session Number: 8

## 2024-12-09 ENCOUNTER — Ambulatory Visit

## 2024-12-09 ENCOUNTER — Other Ambulatory Visit: Payer: Self-pay

## 2024-12-09 ENCOUNTER — Ambulatory Visit
Admission: RE | Admit: 2024-12-09 | Discharge: 2024-12-09 | Disposition: A | Discharge status: Home / Self Care | Source: Ambulatory Visit | Attending: Radiation Oncology | Admitting: Radiation Oncology

## 2024-12-09 DIAGNOSIS — Z510 Encounter for antineoplastic radiation therapy: Secondary | ICD-10-CM | POA: Diagnosis not present

## 2024-12-09 LAB — RAD ONC ARIA SESSION SUMMARY

## 2024-12-10 ENCOUNTER — Ambulatory Visit

## 2024-12-10 ENCOUNTER — Other Ambulatory Visit: Payer: Self-pay

## 2024-12-10 ENCOUNTER — Ambulatory Visit
Admission: RE | Admit: 2024-12-10 | Discharge: 2024-12-10 | Disposition: A | Source: Ambulatory Visit | Attending: Radiation Oncology

## 2024-12-10 DIAGNOSIS — Z510 Encounter for antineoplastic radiation therapy: Secondary | ICD-10-CM | POA: Diagnosis not present

## 2024-12-10 LAB — RAD ONC ARIA SESSION SUMMARY

## 2024-12-15 ENCOUNTER — Ambulatory Visit

## 2024-12-15 NOTE — Radiation Completion Notes (Signed)
 Patient Name: Beth [name], Beth [name] MRN: [id number] Date of Birth: 01-Feb-1971 Referring Physician: JEREL SIEVING, M.D. Date of Service: 2024-12-15 Radiation Oncologist: Norleen Limes, M.D. [facility]                             RADIATION ONCOLOGY END OF TREATMENT NOTE     Diagnosis: C21.1 Malignant neoplasm of anal canal Staging on 2021-03-01: Squamous cell carcinoma of anal canal (HCC) T=cT2, N=cN0, M=cM0 Intent: Palliative     ==========DELIVERED PLANS==========  First Treatment Date: 2024-11-27 Last Treatment Date: 2024-12-10   Plan Name: Pelvis_L Site: Pelvis Technique: Isodose Plan Mode: Photon Dose Per Fraction: 3 Gy Prescribed Dose (Delivered / Prescribed): 30 Gy / 30 Gy Prescribed Fxs (Delivered / Prescribed): 10 / 10   Plan Name: Pelvis_R Site: Pelvis Technique: Isodose Plan Mode: Photon Dose Per Fraction: 3 Gy Prescribed Dose (Delivered / Prescribed): 30 Gy / 30 Gy Prescribed Fxs (Delivered / Prescribed): 10 / 10     ==========ON TREATMENT VISIT DATES========== 2024-11-28, 2024-12-05     ==========UPCOMING VISITS========== 01/12/2025 [facility] POST TREATMENT CALL [facility]POST [facility]  12/31/2024 [facility] EST PT 30 Debby Olam POUR, NP        ==========APPENDIX - ON TREATMENT VISIT NOTES==========   See weekly On Treatment Notes in Epic for details in the Media tab (listed as Progress notes on the On Treatment Visit Dates listed above).

## 2024-12-16 ENCOUNTER — Ambulatory Visit

## 2024-12-17 ENCOUNTER — Ambulatory Visit

## 2024-12-17 NOTE — H&P (Signed)
 Subjective Patient ID: Beth [name] is a 53 y.o. female.     HPI   Last seen 02/2022 at 4 mo post op APR with VRAM reconstruction. Patient's history as below. Returns today for evaluation scalp mass onset 10/2024. Imaging as below. Reports no drainage, no redness. Reports small growth since onset but has remainder painful.   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 PET scan 03/16/2021 showed mass within the anal canal and a focal site of uptake within the inner quadrant of the right breast. Completed chemoradiation.    Reported rectal bleeding 10.2022. Exam with recurrent mass. Completed EUA with biopsy poorly differentiated carcinoma. Final pathology APR invasive poorly differentiated carcinoma 6 x 4 x 3 mm, 0/10 LN, margins clear.   In  Dec 2023 to Jan 2024, diagnosed with right LL mass and underwent right lobectomy Jan 2025.    CTAP 08/2023 showed a soft tissue nodule in the right upper quadrant anterior abdominal wall, biopsy consistent with metastasis from anal cancer. Completed adjuvant chemotherapy. Underwent excision mass 05/2024 with mesh with pathology carcinoma with basaloid features consistent with metastasis from the known anal carcinoma, carcinoma focally less than 0.1 cm.   PET 10/2024 demonstrated 4.7 cm hypermetabolic left gluteus mass, hypermetabolic right gluteal minimus mass. Completed RT to masses 12/10/24   Patient quit smoking at time of cancer diagnosis March 2022.   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 Lives with spouse who works in insurance claims handler.   PMH includes DM on insulin  HbA1c 7.7 05/2024; per patient 6.9 10/2024     EXAM: CT HEAD WITHOUT CONTRAST 11/21/2024 05:46:00 PM   TECHNIQUE: CT of the head was performed without the administration of intravenous contrast. Automated exposure control, iterative reconstruction, and/or weight based adjustment of the mA/kV was utilized to reduce the radiation dose to as low as reasonably  achievable.   COMPARISON: None available.   CLINICAL HISTORY: Metastatic disease evaluation; evaluate lesion at the zenith of the scalp that is painful in pt with history of metastatic anal cancer.   FINDINGS:   BRAIN AND VENTRICLES: No acute hemorrhage. No evidence of acute infarct. No hydrocephalus. No extra-axial collection. No mass effect or midline shift.   ORBITS: No acute abnormality.   SINUSES: No acute abnormality.   SOFT TISSUES AND SKULL: A 4 mm scalp soft tissue lesion is present to the left midline near the crown of the head. Soft tissue extends to the lvm. No focal osseous lesion is present. No skull fracture.   IMPRESSION: 1. 4 mm scalp soft tissue lesion to the left of midline near the crown, extending to the level of the vertex, without underlying osseous involvement. This is nonspecific. Please correlate with direct exam. 2. No acute intracranial abnormality.   Electronically signed by: Lonni Necessary MD 11/21/2024 06:01 PM EST RP Workstation: [id number]   Review of Systems   Objective Physical Exam  Cardiovascular: Normal rate, regular rhythm and normal heart sounds.    Pulmonary/Chest Effort normal and breath sounds normal.    HEENT: vertex with 1 cm non mobile firm mass with TTP, no punctum visible     Assessment/Plan SCC anus Chemoradiation S/p APR, VRAM SBO s/p exlap LOA Scalp mass   Given her history biopsy scalp mass is warranted. She has significant tenderness to area. Plan excisional biopsy in OR under sedation. Reviewed OP surgery, additional procedures pending pathology, risk recurrence. Reviewed risk alopecia area and scar alopecia.    Earlis Ranks, MD [facility] Plastic & Reconstructive Surgery  Office/ physician access line after hours [phone]

## 2024-12-19 ENCOUNTER — Other Ambulatory Visit: Payer: Self-pay

## 2024-12-19 ENCOUNTER — Ambulatory Visit

## 2024-12-19 ENCOUNTER — Encounter (HOSPITAL_BASED_OUTPATIENT_CLINIC_OR_DEPARTMENT_OTHER): Payer: Self-pay | Admitting: Plastic Surgery

## 2024-12-22 ENCOUNTER — Encounter (HOSPITAL_BASED_OUTPATIENT_CLINIC_OR_DEPARTMENT_OTHER)
Admission: RE | Admit: 2024-12-22 | Discharge: 2024-12-22 | Disposition: A | Discharge status: Home / Self Care | Source: Ambulatory Visit | Attending: Plastic Surgery | Admitting: Plastic Surgery

## 2024-12-22 ENCOUNTER — Other Ambulatory Visit: Payer: Self-pay

## 2024-12-22 ENCOUNTER — Ambulatory Visit

## 2024-12-22 DIAGNOSIS — Z01818 Encounter for other preprocedural examination: Secondary | ICD-10-CM | POA: Diagnosis present

## 2024-12-23 ENCOUNTER — Ambulatory Visit

## 2024-12-24 ENCOUNTER — Ambulatory Visit

## 2024-12-25 ENCOUNTER — Encounter: Payer: Self-pay | Admitting: Oncology

## 2024-12-26 ENCOUNTER — Ambulatory Visit (HOSPITAL_BASED_OUTPATIENT_CLINIC_OR_DEPARTMENT_OTHER)
Admission: RE | Admit: 2024-12-26 | Discharge: 2024-12-26 | Disposition: A | Discharge status: Home / Self Care | Attending: Plastic Surgery | Admitting: Plastic Surgery

## 2024-12-26 ENCOUNTER — Encounter (HOSPITAL_BASED_OUTPATIENT_CLINIC_OR_DEPARTMENT_OTHER)
Admission: RE | Disposition: A | Discharge status: Home / Self Care | Payer: Self-pay | Source: Home / Self Care | Attending: Plastic Surgery

## 2024-12-26 ENCOUNTER — Other Ambulatory Visit: Payer: Self-pay

## 2024-12-26 ENCOUNTER — Ambulatory Visit (HOSPITAL_BASED_OUTPATIENT_CLINIC_OR_DEPARTMENT_OTHER): Admitting: Anesthesiology

## 2024-12-26 ENCOUNTER — Encounter (HOSPITAL_BASED_OUTPATIENT_CLINIC_OR_DEPARTMENT_OTHER): Payer: Self-pay | Admitting: Plastic Surgery

## 2024-12-26 DIAGNOSIS — C210 Malignant neoplasm of anus, unspecified: Secondary | ICD-10-CM

## 2024-12-26 DIAGNOSIS — C792 Secondary malignant neoplasm of skin: Secondary | ICD-10-CM | POA: Diagnosis not present

## 2024-12-26 DIAGNOSIS — Z9221 Personal history of antineoplastic chemotherapy: Secondary | ICD-10-CM | POA: Diagnosis not present

## 2024-12-26 DIAGNOSIS — Z7984 Long term (current) use of oral hypoglycemic drugs: Secondary | ICD-10-CM | POA: Diagnosis not present

## 2024-12-26 DIAGNOSIS — Z902 Acquired absence of lung [part of]: Secondary | ICD-10-CM | POA: Diagnosis not present

## 2024-12-26 DIAGNOSIS — Z87891 Personal history of nicotine dependence: Secondary | ICD-10-CM | POA: Insufficient documentation

## 2024-12-26 DIAGNOSIS — Z85048 Personal history of other malignant neoplasm of rectum, rectosigmoid junction, and anus: Secondary | ICD-10-CM | POA: Insufficient documentation

## 2024-12-26 DIAGNOSIS — E119 Type 2 diabetes mellitus without complications: Secondary | ICD-10-CM | POA: Insufficient documentation

## 2024-12-26 DIAGNOSIS — I10 Essential (primary) hypertension: Secondary | ICD-10-CM | POA: Diagnosis not present

## 2024-12-26 DIAGNOSIS — Z01818 Encounter for other preprocedural examination: Secondary | ICD-10-CM

## 2024-12-26 DIAGNOSIS — Z85118 Personal history of other malignant neoplasm of bronchus and lung: Secondary | ICD-10-CM | POA: Diagnosis present

## 2024-12-26 DIAGNOSIS — Z794 Long term (current) use of insulin: Secondary | ICD-10-CM | POA: Diagnosis not present

## 2024-12-26 DIAGNOSIS — Z923 Personal history of irradiation: Secondary | ICD-10-CM | POA: Insufficient documentation

## 2024-12-26 HISTORY — PX: EXCISION MASS HEAD: SHX6702

## 2024-12-26 LAB — GLUCOSE, CAPILLARY
Glucose-Capillary: 134 mg/dL — ABNORMAL HIGH (ref 70–99)
Glucose-Capillary: 152 mg/dL — ABNORMAL HIGH (ref 70–99)

## 2024-12-26 MED ORDER — OXYCODONE HCL 5 MG PO TABS: 5.0000 mg | ORAL_TABLET | Freq: Once | ORAL | Status: DC | PRN

## 2024-12-26 MED ORDER — PHENYLEPHRINE 80 MCG/ML (10ML) SYRINGE FOR IV PUSH (FOR BLOOD PRESSURE SUPPORT)
PREFILLED_SYRINGE | INTRAVENOUS | Status: AC
  Filled 2024-12-26: qty 10

## 2024-12-26 MED ORDER — DIPHENHYDRAMINE HCL 50 MG/ML IJ SOLN
INTRAMUSCULAR | Status: AC
  Filled 2024-12-26: qty 1

## 2024-12-26 MED ORDER — PROPOFOL 500 MG/50ML IV EMUL
INTRAVENOUS | Status: AC
  Filled 2024-12-26: qty 50

## 2024-12-26 MED ORDER — DEXAMETHASONE SODIUM PHOSPHATE 4 MG/ML IJ SOLN
INTRAMUSCULAR | Status: DC | PRN
  Administered 2024-12-26: 5 mg via INTRAVENOUS

## 2024-12-26 MED ORDER — SUCCINYLCHOLINE CHLORIDE 200 MG/10ML IV SOSY
PREFILLED_SYRINGE | INTRAVENOUS | Status: AC
  Filled 2024-12-26: qty 10

## 2024-12-26 MED ORDER — FENTANYL CITRATE (PF) 100 MCG/2ML IJ SOLN
INTRAMUSCULAR | Status: AC
  Filled 2024-12-26: qty 2

## 2024-12-26 MED ORDER — BUPIVACAINE-EPINEPHRINE (PF) 0.25% -1:200000 IJ SOLN
INTRAMUSCULAR | Status: AC
  Filled 2024-12-26: qty 30

## 2024-12-26 MED ORDER — 0.9 % SODIUM CHLORIDE (POUR BTL) OPTIME
TOPICAL | Status: DC | PRN
  Administered 2024-12-26: 1000 mL

## 2024-12-26 MED ORDER — MIDAZOLAM HCL 2 MG/2ML IJ SOLN
INTRAMUSCULAR | Status: AC
  Filled 2024-12-26: qty 2

## 2024-12-26 MED ORDER — ACETAMINOPHEN 500 MG PO TABS
1000.0000 mg | ORAL_TABLET | Freq: Once | ORAL | Status: AC
  Administered 2024-12-26: 1000 mg via ORAL

## 2024-12-26 MED ORDER — LIDOCAINE HCL (CARDIAC) PF 100 MG/5ML IV SOSY
PREFILLED_SYRINGE | INTRAVENOUS | Status: DC | PRN
  Administered 2024-12-26: 80 mg via INTRAVENOUS

## 2024-12-26 MED ORDER — LACTATED RINGERS IV SOLN: INTRAVENOUS | Status: DC

## 2024-12-26 MED ORDER — CEFAZOLIN SODIUM-DEXTROSE 2-4 GM/100ML-% IV SOLN
INTRAVENOUS | Status: AC
  Filled 2024-12-26: qty 100

## 2024-12-26 MED ORDER — MIDAZOLAM HCL 5 MG/5ML IJ SOLN
INTRAMUSCULAR | Status: DC | PRN
  Administered 2024-12-26: 2 mg via INTRAVENOUS

## 2024-12-26 MED ORDER — FENTANYL CITRATE (PF) 100 MCG/2ML IJ SOLN
25.0000 ug | INTRAMUSCULAR | Status: DC | PRN
  Administered 2024-12-26: 50 ug via INTRAVENOUS

## 2024-12-26 MED ORDER — ONDANSETRON HCL 4 MG/2ML IJ SOLN
INTRAMUSCULAR | Status: DC | PRN
  Administered 2024-12-26: 4 mg via INTRAVENOUS

## 2024-12-26 MED ORDER — ONDANSETRON HCL 4 MG/2ML IJ SOLN
INTRAMUSCULAR | Status: AC
  Filled 2024-12-26: qty 2

## 2024-12-26 MED ORDER — PROPOFOL 10 MG/ML IV BOLUS
INTRAVENOUS | Status: AC
  Filled 2024-12-26: qty 20

## 2024-12-26 MED ORDER — BUPIVACAINE-EPINEPHRINE 0.25% -1:200000 IJ SOLN
INTRAMUSCULAR | Status: DC | PRN
  Administered 2024-12-26: 5 mL

## 2024-12-26 MED ORDER — OXYCODONE HCL 5 MG/5ML PO SOLN: 5.0000 mg | Freq: Once | ORAL | Status: DC | PRN

## 2024-12-26 MED ORDER — DEXAMETHASONE SOD PHOSPHATE PF 10 MG/ML IJ SOLN
INTRAMUSCULAR | Status: AC
  Filled 2024-12-26: qty 1

## 2024-12-26 MED ORDER — LIDOCAINE 2% (20 MG/ML) 5 ML SYRINGE
INTRAMUSCULAR | Status: AC
  Filled 2024-12-26: qty 5

## 2024-12-26 MED ORDER — ATROPINE SULFATE (PF) 0.4 MG/ML IJ SOLN
INTRAMUSCULAR | Status: AC
  Filled 2024-12-26: qty 1

## 2024-12-26 MED ORDER — EPHEDRINE 5 MG/ML INJ
INTRAVENOUS | Status: AC
  Filled 2024-12-26: qty 5

## 2024-12-26 MED ORDER — CEFAZOLIN SODIUM-DEXTROSE 2-4 GM/100ML-% IV SOLN
2.0000 g | INTRAVENOUS | Status: AC
  Administered 2024-12-26: 2 g via INTRAVENOUS

## 2024-12-26 MED ORDER — BACITRACIN 500 UNIT/GM EX OINT
TOPICAL_OINTMENT | CUTANEOUS | Status: DC | PRN
  Administered 2024-12-26: 1 via TOPICAL

## 2024-12-26 MED ORDER — PROPOFOL 10 MG/ML IV BOLUS
INTRAVENOUS | Status: DC | PRN
  Administered 2024-12-26: 200 mg via INTRAVENOUS

## 2024-12-26 MED ORDER — DIPHENHYDRAMINE HCL 50 MG/ML IJ SOLN
INTRAMUSCULAR | Status: DC | PRN
  Administered 2024-12-26: 6.25 mg via INTRAVENOUS

## 2024-12-26 MED ORDER — ACETAMINOPHEN 500 MG PO TABS
ORAL_TABLET | ORAL | Status: AC
  Filled 2024-12-26: qty 2

## 2024-12-26 MED ORDER — BACITRACIN ZINC 500 UNIT/GM EX OINT
TOPICAL_OINTMENT | CUTANEOUS | Status: AC
  Filled 2024-12-26: qty 1.8

## 2024-12-26 MED ORDER — FENTANYL CITRATE (PF) 100 MCG/2ML IJ SOLN
INTRAMUSCULAR | Status: DC | PRN
  Administered 2024-12-26: 100 ug via INTRAVENOUS

## 2024-12-26 MED ORDER — AMISULPRIDE (ANTIEMETIC) 5 MG/2ML IV SOLN: 10.0000 mg | Freq: Once | INTRAVENOUS | Status: DC | PRN

## 2024-12-26 MED ORDER — BUPIVACAINE-EPINEPHRINE (PF) 0.5% -1:200000 IJ SOLN
INTRAMUSCULAR | Status: AC
  Filled 2024-12-26: qty 30

## 2024-12-26 MED ORDER — OXYCODONE HCL 5 MG PO TABS: 2.5000 mg | ORAL_TABLET | ORAL | 0 refills | Status: AC | PRN

## 2024-12-26 NOTE — Anesthesia Preprocedure Evaluation (Addendum)
"                                    Anesthesia Evaluation  Patient identified by MRN, date of birth, ID band Patient awake    Reviewed: Allergy & Precautions, NPO status , Patient's Chart, lab work & pertinent test results  Airway Mallampati: III  TM Distance: >3 FB Neck ROM: Full    Dental no notable dental hx. (+) Teeth Intact, Dental Advisory Given   Pulmonary former smoker   Pulmonary exam normal breath sounds clear to auscultation       Cardiovascular hypertension, Pt. on medications Normal cardiovascular exam Rhythm:Regular Rate:Normal     Neuro/Psych negative neurological ROS  negative psych ROS   GI/Hepatic negative GI ROS,,,(+)     substance abuse    Endo/Other  diabetes, Type 2, Insulin  Dependent, Oral Hypoglycemic Agents    Renal/GU negative Renal ROS  negative genitourinary   Musculoskeletal negative musculoskeletal ROS (+)  narcotic dependent  Abdominal   Peds  Hematology negative hematology ROS (+)   Anesthesia Other Findings Colon, rectal CA  Reproductive/Obstetrics                              Anesthesia Physical Anesthesia Plan  ASA: 3  Anesthesia Plan: General   Post-op Pain Management: Tylenol  PO (pre-op)*   Induction: Intravenous  PONV Risk Score and Plan: 3 and Ondansetron , Dexamethasone  and Midazolam   Airway Management Planned: LMA  Additional Equipment:   Intra-op Plan:   Post-operative Plan: Extubation in OR  Informed Consent: I have reviewed the patients History and Physical, chart, labs and discussed the procedure including the risks, benefits and alternatives for the proposed anesthesia with the patient or authorized representative who has indicated his/her understanding and acceptance.     Dental advisory given  Plan Discussed with: CRNA  Anesthesia Plan Comments:          Anesthesia Quick Evaluation  "

## 2024-12-26 NOTE — Interval H&P Note (Signed)
 History and Physical Interval Note:  12/26/2024 9:24 AM  Beth [name]  has presented today for surgery, with the diagnosis of scalp mass, SCC anus.  The various methods of treatment have been discussed with the patient and family. After consideration of risks, benefits and other options for treatment, the patient has consented to  Procedures with comments: EXCISION, MASS, HEAD (N/A) - **EXCISIONAL BIOPSY SCALP MASS** as a surgical intervention.  The patient's history has been reviewed, patient examined, no change in status, stable for surgery.  I have reviewed the patient's chart and labs.  Questions were answered to the patient's satisfaction.     [name]

## 2024-12-26 NOTE — Discharge Instructions (Signed)
 No tylenol  until 3:20 p.m.    Post Anesthesia Home Care Instructions  Activity: Get plenty of rest for the remainder of the day. A responsible individual must stay with you for 24 hours following the procedure.  For the next 24 hours, DO NOT: -Drive a car -Advertising copywriter -Drink alcoholic beverages -Take any medication unless instructed by your physician -Make any legal decisions or sign important papers.  Meals: Start with liquid foods such as gelatin or soup. Progress to regular foods as tolerated. Avoid greasy, spicy, heavy foods. If nausea and/or vomiting occur, drink only clear liquids until the nausea and/or vomiting subsides. Call your physician if vomiting continues.  Special Instructions/Symptoms: Your throat may feel dry or sore from the anesthesia or the breathing tube placed in your throat during surgery. If this causes discomfort, gargle with warm salt water . The discomfort should disappear within 24 hours.  If you had a scopolamine  patch placed behind your ear for the management of post- operative nausea and/or vomiting:  1. The medication in the patch is effective for 72 hours, after which it should be removed.  Wrap patch in a tissue and discard in the trash. Wash hands thoroughly with soap and water . 2. You may remove the patch earlier than 72 hours if you experience unpleasant side effects which may include dry mouth, dizziness or visual disturbances. 3. Avoid touching the patch. Wash your hands with soap and water  after contact with the patch.

## 2024-12-26 NOTE — Op Note (Signed)
 Operative Note   DATE OF OPERATION: 1.9.2026  LOCATION: [facility]-outpatient  SURGICAL DIVISION: Plastic Surgery  PREOPERATIVE DIAGNOSES:  1. Scalp mass 2. Metastatic anal cancer  POSTOPERATIVE DIAGNOSES:  same  PROCEDURE:  1. Excisional biopsy scalp mass 1 cm 2. Layered closure 2 cm  SURGEON: Earlis Ranks MD MBA  ASSISTANT: none  ANESTHESIA:  General.   EBL: 15 ml  COMPLICATIONS: None immediate.   INDICATIONS FOR PROCEDURE:  The patient, Beth [name], is a 54 y.o. female born on 1971/11/20, is here for excisional biopsy scalp mass.   FINDINGS: 1 cm mass firm non mobile vertex scalp.   DESCRIPTION OF PROCEDURE:  The patient's operative site was marked with the patient in the preoperative area. The patient was taken to the operating room. SCDs were placed and IV antibiotics were given. The patient's operative site was prepped and draped in a sterile fashion. A time out was performed and all information was confirmed to be correct. Local anesthetic infiltrated surrounding mass. Mass with margins excised 1 cm diameter. Additional deep margin including excision periosteum completed. Layered closure completed with 3-0 monocryl in subcutaneous tissue and fascia followed by interrupted 3-0 monocryl skin closure, length 2 cm. Antibiotic ointment applied.  The patient was allowed to wake from anesthesia, extubated and taken to the recovery room in satisfactory condition.   SPECIMENS: scalp mass, deep margin  DRAINS: none  Earlis Ranks, MD [facility] Plastic & Reconstructive Surgery  Office/ physician access line after hours [phone]

## 2024-12-26 NOTE — Anesthesia Postprocedure Evaluation (Signed)
"   Anesthesia Post Note  Patient: Beth [name]  Procedure(s) Performed: EXCISION, MASS, HEAD (Scalp)     Patient location during evaluation: PACU Anesthesia Type: General Level of consciousness: awake and alert Pain management: pain level controlled Vital Signs Assessment: post-procedure vital signs reviewed and stable Respiratory status: spontaneous breathing, nonlabored ventilation, respiratory function stable and patient connected to nasal cannula oxygen Cardiovascular status: blood pressure returned to baseline and stable Postop Assessment: no apparent nausea or vomiting Anesthetic complications: no   No notable events documented.  Last Vitals:  Vitals:   12/26/24 1145 12/26/24 1158  BP: 131/79 (!) 145/97  Pulse: 77 82  Resp: 17 18  Temp:  36.4 C  SpO2: 95% 97%    Last Pain:  Vitals:   12/26/24 1158  TempSrc: Temporal  PainSc: 3                  Desean Heemstra L Zawadi Aplin      "

## 2024-12-26 NOTE — Anesthesia Procedure Notes (Signed)
 Procedure Name: LMA Insertion Date/Time: 12/26/2024 10:07 AM  Performed by: Emilio Rock BIRCH, CRNAPre-anesthesia Checklist: Patient identified, Emergency Drugs available, Suction available and Patient being monitored Patient Re-evaluated:Patient Re-evaluated prior to induction Oxygen Delivery Method: Circle System Utilized Preoxygenation: Pre-oxygenation with 100% oxygen Induction Type: IV induction Ventilation: Mask ventilation without difficulty LMA: LMA flexible inserted LMA Size: 4.0 Number of attempts: 1 Airway Equipment and Method: bite block Placement Confirmation: positive ETCO2 Tube secured with: Tape Dental Injury: Teeth and Oropharynx as per pre-operative assessment

## 2024-12-26 NOTE — Transfer of Care (Signed)
 Immediate Anesthesia Transfer of Care Note  Patient: Beth [name]  Procedure(s) Performed: EXCISION, MASS, HEAD (Scalp)  Patient Location: PACU  Anesthesia Type:General  Level of Consciousness: awake, alert , oriented, drowsy, and patient cooperative  Airway & Oxygen Therapy: Patient Spontanous Breathing and Patient connected to face mask oxygen  Post-op Assessment: Report given to RN and Post -op Vital signs reviewed and stable  Post vital signs: Reviewed and stable  Last Vitals:  Vitals Value Taken Time  BP 139/83 12/26/24 11:11  Temp    Pulse 85 12/26/24 11:14  Resp 15 12/26/24 11:14  SpO2 97 % 12/26/24 11:14  Vitals shown include unfiled device data.  Last Pain:  Vitals:   12/26/24 0917  TempSrc: Temporal  PainSc: 0-No pain         Complications: No notable events documented.

## 2024-12-27 ENCOUNTER — Encounter (HOSPITAL_BASED_OUTPATIENT_CLINIC_OR_DEPARTMENT_OTHER): Payer: Self-pay | Admitting: Plastic Surgery

## 2024-12-29 NOTE — Telephone Encounter (Signed)
 Post op call complete. Patient states she is doing well. Pain controlled. Denies any fever, SOB, increased redness or swelling. Informed patient she is able to take shower with shampoo/conditioner and to avoid other hair products over the area. Informed patient to use Vaseline or antibiotic ointment daily and PRN. Follow up visit confirmed. Patient verbalized understanding.

## 2024-12-30 LAB — SURGICAL PATHOLOGY

## 2024-12-30 NOTE — Telephone Encounter (Signed)
 Reviewed pathology with patient. She has scheduled follow up with Oncology tomorrow. Reviewed with patient I have informed her Oncology and Radiation Oncology team of pathology. Patient has scheduled follow up with me in 2 days.

## 2024-12-31 ENCOUNTER — Inpatient Hospital Stay: Attending: Oncology | Admitting: Nurse Practitioner

## 2024-12-31 ENCOUNTER — Telehealth: Payer: Self-pay | Admitting: *Deleted

## 2024-12-31 ENCOUNTER — Encounter: Payer: Self-pay | Admitting: Nurse Practitioner

## 2024-12-31 VITALS — BP 115/84 | HR 87 | Temp 97.7°F | Resp 18 | Ht 67.0 in | Wt 178.8 lb

## 2024-12-31 DIAGNOSIS — C210 Malignant neoplasm of anus, unspecified: Secondary | ICD-10-CM | POA: Diagnosis not present

## 2024-12-31 MED ORDER — OXYCODONE HCL 5 MG PO TABS: 2.5000 mg | ORAL_TABLET | ORAL | 0 refills | Status: AC | PRN

## 2024-12-31 NOTE — Progress Notes (Signed)
 "  [facility] OFFICE PROGRESS NOTE   Diagnosis: Anal cancer  INTERVAL HISTORY:   Beth [name] returns as scheduled.  She completed radiation to the gluteal masses 12/10/2024.  She underwent excision of a scalp mass 12/26/2024.  Pathology showed poorly differentiated carcinoma with basaloid features and metaplastic stroma consistent with metastasis from known anal carcinoma.    She has discomfort at the scalp mass excision site.  Overall gluteal pain is better.  She continues to note the right side gluteal mass.  She takes oxycodone  as needed.  Bowels are moving.  She wonders if she should receive radiation to the scalp.  Objective:  Vital signs in last 24 hours:  Blood pressure 115/84, pulse 87, temperature 97.7 F (36.5 C), temperature source Temporal, resp. rate 18, height 5' 7 (1.702 m), weight 178 lb 12.8 oz (81.1 kg), last menstrual period 03/13/2012, SpO2 96%.    HEENT: No thrush or ulcers. Resp: Lungs clear bilaterally. Cardio: Regular rate and rhythm. GI: No hepatosplenomegaly. Vascular: No leg edema. Skin: Central parietal scalp excision site with mild erythema. Port-A-Cath without erythema.  Lab Results:  Lab Results  Component Value Date   WBC 5.6 12/04/2024   HGB 13.4 12/04/2024   HCT 39.9 12/04/2024   MCV 99.0 12/04/2024   PLT 127 (L) 12/04/2024   NEUTROABS 4.0 12/04/2024    Imaging:  No results found.  Medications: I have reviewed the patient's current medications.  Assessment/Plan: Squamous cell carcinoma of the anal canal Colonoscopy 01/28/2021-rectal mass extending to the outside, palpable on exam-biopsy revealed high-grade squamous intraepithelial lesion (AIN 3/carcinoma in situ) Exam under anesthesia with transanal excision of an anal canal mass 02/25/2021-frozen section consistent with squamous cell carcinoma, 4 x 4 centimeter sessile anal canal mass extending to the anal verge, right lateral and posterior, 30% circumferential, fixed  to the anal sphincter complex; anal rectal mass with mixed high-grade neuroendocrine squamous cell carcinoma; posterior anal tag with squamous cell carcinoma; left labial lesion-condyloma acuminata CTs 03/07/2021-ill-definition of tissue planes along the anus with abnormal soft tissue prominence posteriorly along the anal canal suspicious for mass.  Small adjacent lower perirectal lymph nodes in addition to a 0.5 cm sacral node.  Focal enhancing lesion in the right hepatic lobe 1.2 x 0.8 x 0.9 cm.  2.1 x 1.5 cm cystic lesion right ovary with thin enhancing rim. PET scan 03/16/2021-hypermetabolic mass within the anal canal.  No definite evidence of local or distant metastatic disease.  7 mm left submandibular lymph node with mild FDG uptake, likely reactive.  Focal site of FDG uptake within the inner quadrant of the right breast. Radiation 03/21/2021-04/27/2021 Cycle one 5-FU/mitomycin -C 03/21/2021 Cycle two 5-FU/Mitomycin -C 04/18/2021, 5-FU dose reduced secondary to mucositis Anoscopy by Dr. Sheldon 07/20/2021-divet in the right posterior lateral aspect with some right anterior and right lateral folds consistent with radiation treatment.  No ulceration.  Not fully resolved but markedly shrunk down. Anorectal mass 09/30/2021 examination under anesthesia, biopsy-poorly differentiated carcinoma-basaloid squamous carcinoma and high-grade neuroendocrine carcinoma, margins positive APR/and vertical rectus abdominis flap 11/03/2021, invasive poorly differentiated carcinoma, negative resection margins, 0/10 nodes, treatment response score 2,ypT1,ypN0 CT abdomen/pelvis 12/03/2021-high-grade small bowel obstruction transitioning in the left lower abdomen.  Presacral rim-enhancing 4.4 x 2.8 x 3.8 cm fluid collection.  No air in the collection.  Prior study described a potential abnormality in the right lobe of the liver, not seen on current study.  Cystic lesion of the right ovary resolved. 12/04/2021: Lysis of adhesions  secondary to internal hernia  CT chest 12/07/2021-interval development of bilateral trace pleural effusions. CTs 12/04/2022-new 2.6 x 2.5 cm right lower lobe nodule PET 12/21/2022-FDG avid right lower lobe nodule, no other evidence of metastatic disease 12/29/2022 right lower lobectomy, lymph node dissection-poorly differentiated carcinoma consistent with metastasis, 4.8 x 2.9 x 2.6 cm, margins free, 7 lymph nodes negative for carcinoma; cytohistomorphology correlation supports the current tumor being a metastasis from the previously diagnosed anal carcinoma CT chest 04/17/2023-interval right lower lobectomy with resection of right lung mass, pleural thickening and small right pleural effusion, no new mass or lymph node enlargement CTs 07/20/2023-diminished postoperative right pleural effusion.  Unchanged postoperative findings status post abdominoperineal resection with left lower quadrant and colostomy.  No evidence of metastatic disease. CT abdomen/pelvis 08/31/2023-heterogenous soft tissue nodule in the right upper quadrant anterior abdominal wall, no evidence of metastatic disease CT/ultrasound guided biopsy of right abdominal wall mass 09/07/2023-metastatic neoplasm consistent with a metastasis from anal cancer, Foundation 1-HRD signature negative, MSS, tumor mutation burden 4, BRCA 1 alteration, PD-L1 TPS 10% Cycle 1 day 1 Taxol //carboplatin  10/19/2023, day 8 Taxol  10/26/2023, day 15 Taxol  held 11/02/2023 due to neutropenia Cycle 2-day 1 Taxol /carboplatin  plus Retifanlimab  11/14/2023, Taxol  and carboplatin  dose reduced due to previous neutropenia, mild thrombocytopenia; day 8 Taxol  11/22/2023, day 15 Taxol  11/29/2023 Cycle 3-day 1 Taxol /carboplatin  plus retifanlimab  12/14/2023, day 8 Taxol  12/21/2023, day 15 Taxol  12/28/2023 CT abdomen/pelvis 01/09/2024: Decreased right anterior abdominal wall nodule, no evidence of disease progression Cycle 4-day 1 Taxol /carboplatin /retifanlimab  01/11/2024; day 8 Taxol  01/18/2024  (given peripherally), day 15 Taxol  01/25/2024 (given peripherally) Cycle 5-day 1 Taxol /carboplatin /retifanlimab  02/08/2024, day 8 Taxol  02/13/2024, day 15 Taxol  02/22/2024 Cycle 6-day 1 carboplatin /retifanlimib 03/07/2024, paclitaxel  placed on hold secondary to nail toxicity CT 03/14/2024: Stable right anterior abdominal wall nodule, no evidence of progressive metastatic disease, stable small nodes in the upper retroperitoneum and mediastinum Treatment continued with monthly single agent retifanlimib CT abdomen/pelvis 05/09/2024-interval growth of soft tissue mass right anterior abdominal wall. Treatment held 05/29/2024 Excision of right abdominal wall mass, excision of abdominal wall mass, abdominal wall reconstruction with mesh 06/05/2024-pathology on the right subcostal abdominal wall mass-carcinoma with basaloid features consistent with metastasis from the known anal carcinoma, carcinoma focally less than 0.1 cm from the inked margin of the specimen; right flank nodule-nodule of adipose tissue with fat necrosis, negative for malignancy. Tumor board 07/02/2024-observation recommended 08/08/2024 CTs: Seen soft tissue nodule at the ventral right abdomen has been resected with adjacent postoperative change, unchanged postoperative appearance status post right lower lobectomy, unchanged 0.4 cm peripheral left lower lobe nodule-nonspecific, unchanged prominent upper abdominal lymph nodes-nonspecific 11/07/2024 PET: 4.7 cm hypermetabolic left gluteus mass, hypermetabolic right gluteal minimus mass, no evidence of local tumor recurrence at the rectum (mass of the left gluteus is visible on the 08/08/2024 CT, not appreciated on the 05/09/2024 CT Head CT 11/21/2024-4 mm scalp soft tissue lesion to the left of midline near the crown, extending to the level of the vertex, without underlying osseous involvement. Radiation bilateral gluteus mass 11/27/2024 Excision scalp mass 12/26/2024-poorly differentiated carcinoma with  basaloid features and metaplastic stroma consistent with metastasis from known anal carcinoma     Remote history of an anal fissure repair Tobacco use Moderate alcohol use Eczema Right breast mass- PET scan 03/16/2021 with focal FDG activity in the inner right breast Mammogram and right breast ultrasound 03/28/2021- negative Bilateral breast MRI 03/31/2021- 7 mm mass in the upper inner right breast correlating with the PET findings, no other abnormality, no adenopathy MRI guided biopsy of right  breast mass 04/11/2021- fibrocystic change with sclerosing adenosis, small focus of inflammation/fibrosis suggestive of resolving fat necrosis, no evidence of malignancy--repeat bilateral breast MRI at a 11-month interval; bilateral breast MRI on 10/13/2021-no evidence of breast malignancy.  Annual screening mammography recommended.   7.  Admission with small bowel obstruction 12/03/2021 status post exploratory laparotomy and lysis of adhesions 8.  Right abdominal pain/tenderness-potentially related to the right abdominal wall mass noted on CT 08/31/2023 9.  Elevated liver enzymes-chronic, etiology unclear 10.  Hematuria-evaluated by urology 09/11/2023, cystoscopy with radiation and infection changes, no tumor seen 11.  Right neck/chest tenderness 12/21/2023-negative Doppler study.  Recurrent symptoms 01/15/2024.  Started on a course of Augmentin .  Referred for Port-A-Cath study 01/18/2024-study completed 02/01/2024, intact tunneled right internal jugular vein Port-A-Cath with catheter tip located just below the SVC/RA junction.  No evidence of catheter rupture, contrast extravasation or fibrin sheath. Port-A-Cath dye study 02/01/2024: Normal 12.  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  Left great toenail removed by podiatry 04/09/2024. 13.  December 2024: Negative genetic testing  Disposition: Beth [name] appears stable.  She completed radiation to the gluteal  masses.  Pain is better.  She had the scalp mass excised.  We reviewed the pathology report with her at today's visit.  She understands the mass is consistent with a metastasis from anal cancer.  We will ask Dr. Dewey regarding radiation to the scalp.  Our plan is for restaging CTs in approximately 1 month.  Port-A-Cath will be flushed today.  New oxycodone  prescription sent to her pharmacy.  She will return for follow-up on 02/04/2025 to review CT results.  We are available to see her sooner if needed.  Patient seen with Dr. Cloretta.  Olam Ned ANP/GNP-BC   12/31/2024  2:37 PM  This was a shared visit with Olam Ned.  Beth [name] was interviewed and examined.  She completed palliative radiation to the gluteal masses and underwent resection of a scalp mass on 12/26/2024.  The pathology from the scalp mass resection confirmed metastatic anal cancer.  We will defer the indication for scalp radiation to Dr. Dewey.  She currently has no measurable disease.  I do not recommend initiating systemic therapy at present.  I reviewed the case with Dr. Markham.  She is currently not eligible for a clinical trial at [facility].  Systemic treatment options include 5-FU/cisplatin, docetaxel, and FOLFOX.  I am concerned she could develop significant nail toxicity with docetaxel.  She will undergo a restaging CT evaluation prior to an office visit in February.  Arvella Cloretta, MD      "

## 2025-01-01 ENCOUNTER — Other Ambulatory Visit: Payer: Self-pay

## 2025-01-01 NOTE — Progress Notes (Signed)
 Subjective Patient ID: Beth [name] is a 54 y.o. female.   HPI  1 week post op excisional biopsy scalp mass onset 10/2024. Imaging as below. Pathology consistent with metastatic carcinoma. Seen by Oncology yesterday, no systemic treatment planned. Plan restaging scans 2.16.2026  Diagnosed March 2022 SCC anus with neuroendocring features. PET scan 03/16/2021 showed mass within the anal canal and a focal site of uptake within the inner quadrant of the right breast. Completed chemoradiation.   Reported rectal bleeding 10.2022. Exam with recurrent mass. Completed EUA with biopsy poorly differentiated carcinoma. Final pathology APR invasive poorly differentiated carcinoma 6 x 4 x 3 mm, 0/10 LN, margins clear.  In  Dec 2023 to Jan 2024, diagnosed with right LL mass and underwent right lobectomy Jan 2025.   CTAP 08/2023 showed a soft tissue nodule in the right upper quadrant anterior abdominal wall, biopsy consistent with metastasis from anal cancer. Completed adjuvant chemotherapy. Underwent excision mass 05/2024 with mesh with pathology carcinoma with basaloid features consistent with metastasis from the known anal carcinoma, carcinoma focally less than 0.1 cm.  PET 10/2024 demonstrated 4.7 cm hypermetabolic left gluteus mass, hypermetabolic right gluteal minimus mass. Completed RT to masses 12/10/24  Patient quit smoking at time of cancer diagnosis March 2022.  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 Lives with spouse who works in insurance claims handler.  PMH includes DM on insulin  HbA1c 7.7 05/2024; per patient 6.9 10/2024   Review of Systems  Objective Physical Exam Abd/perineum deferred   HEENT: vertex incision intact no drainage  Assessment/Plan SCC anus Chemoradiation S/p APR, VRAM SBO s/p exlap LOA Scalp mass s/p excisional biopsy   Has scheduled follow up with Rad Onc 1.26.26 Staging scans 2.16.26  May discontinue ointment over incision. Will await any recommendations from Rad Onc.  If RT recommenced asked patient to let me know so we can check surgical site prior to treatment. Also call if any new lesions noted.

## 2025-01-04 ENCOUNTER — Other Ambulatory Visit: Payer: Self-pay | Admitting: Oncology

## 2025-01-04 DIAGNOSIS — C210 Malignant neoplasm of anus, unspecified: Secondary | ICD-10-CM

## 2025-01-05 ENCOUNTER — Encounter: Payer: Self-pay | Admitting: Oncology

## 2025-01-05 NOTE — Progress Notes (Signed)
" °  Radiation Oncology         [phone] ________________________________  Name: Beth [name] MRN: [id number]  Date of Service: 01/12/2025  DOB: 03/18/1971  Post Treatment Telephone Note  Diagnosis:  Squamous cell carcinoma of anal canal   First Treatment Date: 2024-11-27 Last Treatment Date: 2024-12-10   Plan Name: Pelvis_L Site: Pelvis Technique: Isodose Plan Mode: Photon Dose Per Fraction: 3 Gy Prescribed Dose (Delivered / Prescribed): 30 Gy / 30 Gy Prescribed Fxs (Delivered / Prescribed): 10 / 10   Plan Name: Pelvis_R Site: Pelvis Technique: Isodose Plan Mode: Photon Dose Per Fraction: 3 Gy Prescribed Dose (Delivered / Prescribed): 30 Gy / 30 Gy Prescribed Fxs (Delivered / Prescribed): 10 / 10  The patient was available for call today.   Symptoms of fatigue have improved since completing therapy.  Symptoms of skin changes have improved since completing therapy.   Symptoms of bladder changes have improved since completing therapy.  Symptoms of bowel changes have improved since completing therapy.  The patient has a scheduled follow up with her medical oncologist Dr. Cloretta and their surgeon Dr. Elspeth Schultze for ongoing surveillance. She was encouraged to call if she develops concerns or questions regarding radiation.   "

## 2025-01-07 ENCOUNTER — Encounter: Payer: Self-pay | Admitting: Oncology

## 2025-01-07 NOTE — Telephone Encounter (Signed)
 Open in error

## 2025-01-12 ENCOUNTER — Ambulatory Visit
Admission: RE | Admit: 2025-01-12 | Discharge: 2025-01-12 | Disposition: A | Discharge status: Home / Self Care | Source: Ambulatory Visit | Attending: Radiation Oncology | Admitting: Radiation Oncology

## 2025-01-12 DIAGNOSIS — D013 Carcinoma in situ of anus and anal canal: Secondary | ICD-10-CM

## 2025-01-12 DIAGNOSIS — C211 Malignant neoplasm of anal canal: Secondary | ICD-10-CM

## 2025-02-02 ENCOUNTER — Other Ambulatory Visit

## 2025-02-04 ENCOUNTER — Inpatient Hospital Stay: Admitting: Oncology
